# Patient Record
Sex: Female | Born: 1945
Health system: Southern US, Community
[De-identification: ages and names within clinical notes are randomized; demographics above are authoritative.]

## PROBLEM LIST (undated history)

## (undated) DIAGNOSIS — W19XXXA Unspecified fall, initial encounter: Secondary | ICD-10-CM

## (undated) DIAGNOSIS — T7840XA Allergy, unspecified, initial encounter: Secondary | ICD-10-CM

## (undated) DIAGNOSIS — M2550 Pain in unspecified joint: Secondary | ICD-10-CM

## (undated) DIAGNOSIS — I639 Cerebral infarction, unspecified: Secondary | ICD-10-CM

## (undated) DIAGNOSIS — M1990 Unspecified osteoarthritis, unspecified site: Secondary | ICD-10-CM

## (undated) DIAGNOSIS — I10 Essential (primary) hypertension: Secondary | ICD-10-CM

## (undated) DIAGNOSIS — F419 Anxiety disorder, unspecified: Secondary | ICD-10-CM

## (undated) DIAGNOSIS — M549 Dorsalgia, unspecified: Secondary | ICD-10-CM

## (undated) DIAGNOSIS — E7800 Pure hypercholesterolemia, unspecified: Secondary | ICD-10-CM

## (undated) DIAGNOSIS — D689 Coagulation defect, unspecified: Secondary | ICD-10-CM

## (undated) DIAGNOSIS — F32A Depression, unspecified: Secondary | ICD-10-CM

## (undated) DIAGNOSIS — F329 Major depressive disorder, single episode, unspecified: Secondary | ICD-10-CM

## (undated) DIAGNOSIS — R531 Weakness: Secondary | ICD-10-CM

## (undated) HISTORY — PX: APPENDECTOMY: SHX54

## (undated) HISTORY — DX: Anxiety disorder, unspecified: F41.9

## (undated) HISTORY — DX: Coagulation defect, unspecified: D68.9

## (undated) HISTORY — DX: Major depressive disorder, single episode, unspecified: F32.9

## (undated) HISTORY — PX: TONSILLECTOMY: SUR1361

## (undated) HISTORY — PX: HEMORROIDECTOMY: SUR656

## (undated) HISTORY — PX: TUBAL LIGATION: SHX77

## (undated) HISTORY — DX: Unspecified osteoarthritis, unspecified site: M19.90

## (undated) HISTORY — DX: Cerebral infarction, unspecified: I63.9

## (undated) HISTORY — DX: Pain in unspecified joint: M25.50

## (undated) HISTORY — DX: Allergy, unspecified, initial encounter: T78.40XA

## (undated) HISTORY — DX: Depression, unspecified: F32.A

## (undated) HISTORY — DX: Weakness: R53.1

## (undated) HISTORY — DX: Unspecified fall, initial encounter: W19.XXXA

---

## 2011-06-03 ENCOUNTER — Emergency Department (HOSPITAL_COMMUNITY): Payer: Self-pay

## 2011-06-03 ENCOUNTER — Other Ambulatory Visit: Payer: Self-pay

## 2011-06-03 ENCOUNTER — Emergency Department (HOSPITAL_COMMUNITY)
Admission: EM | Admit: 2011-06-03 | Discharge: 2011-06-04 | Disposition: A | Discharge status: Home / Self Care | Payer: Self-pay | Attending: Emergency Medicine | Admitting: Emergency Medicine

## 2011-06-03 DIAGNOSIS — E7800 Pure hypercholesterolemia, unspecified: Secondary | ICD-10-CM | POA: Insufficient documentation

## 2011-06-03 DIAGNOSIS — I10 Essential (primary) hypertension: Secondary | ICD-10-CM | POA: Insufficient documentation

## 2011-06-03 DIAGNOSIS — M549 Dorsalgia, unspecified: Secondary | ICD-10-CM | POA: Insufficient documentation

## 2011-06-03 DIAGNOSIS — R0789 Other chest pain: Secondary | ICD-10-CM | POA: Insufficient documentation

## 2011-06-03 HISTORY — DX: Pure hypercholesterolemia, unspecified: E78.00

## 2011-06-03 HISTORY — DX: Essential (primary) hypertension: I10

## 2011-06-03 LAB — CBC
HCT: 43.7 % (ref 36.0–46.0)
Hemoglobin: 14.6 g/dL (ref 12.0–15.0)
MCH: 27.7 pg (ref 26.0–34.0)
MCV: 82.8 fL (ref 78.0–100.0)
RBC: 5.28 MIL/uL — ABNORMAL HIGH (ref 3.87–5.11)

## 2011-06-03 LAB — DIFFERENTIAL
Eosinophils Absolute: 0.1 10*3/uL (ref 0.0–0.7)
Eosinophils Relative: 2 % (ref 0–5)
Lymphs Abs: 2.8 10*3/uL (ref 0.7–4.0)
Monocytes Absolute: 0.8 10*3/uL (ref 0.1–1.0)
Monocytes Relative: 8 % (ref 3–12)
Neutrophils Relative %: 61 % (ref 43–77)

## 2011-06-03 LAB — BASIC METABOLIC PANEL
BUN: 13 mg/dL (ref 6–23)
GFR calc non Af Amer: 88 mL/min — ABNORMAL LOW (ref >90)
Glucose, Bld: 90 mg/dL (ref 70–99)
Potassium: 4.1 mEq/L (ref 3.5–5.1)

## 2011-06-03 MED ORDER — DIAZEPAM 5 MG PO TABS
5.0000 mg | ORAL_TABLET | Freq: Once | ORAL | Status: AC
  Administered 2011-06-04: 5 mg via ORAL
  Filled 2011-06-03: qty 1

## 2011-06-03 MED ORDER — OXYCODONE-ACETAMINOPHEN 5-325 MG PO TABS
1.0000 | ORAL_TABLET | Freq: Once | ORAL | Status: AC
  Administered 2011-06-04: 1 via ORAL
  Filled 2011-06-03: qty 1

## 2011-06-03 NOTE — ED Notes (Signed)
Patient presents with chest pain under left breast and "indigestion" since 1300 today.  Patient also reporting upper back pain but does not radiated to chest.  Denies nausea and vomiting. Patient rates pain at 6/10

## 2011-06-03 NOTE — ED Notes (Signed)
Pt st's had onset of left upper back pain approx 2pm today. St's then started radiating around to under left breast.  St's pain is mostly under left breast radiating into back.  Skin warm and dry, color appropriate.  Family at bedside.

## 2011-06-04 LAB — POCT I-STAT TROPONIN I: Troponin i, poc: 0.01 ng/mL (ref 0.00–0.08)

## 2011-06-04 MED ORDER — DIAZEPAM 5 MG PO TABS: 5.0000 mg | ORAL_TABLET | Freq: Three times a day (TID) | ORAL | Status: AC | PRN

## 2011-06-04 MED ORDER — OXYCODONE-ACETAMINOPHEN 5-325 MG PO TABS: 2.0000 | ORAL_TABLET | ORAL | Status: AC | PRN

## 2011-06-04 NOTE — ED Provider Notes (Signed)
History     CSN: [id number] Arrival date & time: 06/03/2011  5:52 PM   First MD Initiated Contact with Patient 06/03/11 2300      Chief Complaint  Patient presents with  . Chest Pain  . Back Pain    (Consider location/radiation/quality/duration/timing/severity/associated sxs/prior treatment) HPI 65 year old female presents to emergency department with back pain that radiates around to under her left breast. Patient reports onset of left back pain today around 2 PM. Patient reports history of chronic left back pain, but it does not usually radiate around to her chest. Chest pain started around 4 PM today. Pain has been constant since that time with intermittent sharp stabbing pains. Pain is worse with movement of arm, cough, or deep breath. Pain also worse with palpation of back and left chest wall. Patient reports history of hypertension and hyperlipidemia. Patient is nonsmoker. No prior history of coronary disease. Patient does not have a local doctor. No radiation of pain, no nausea no diaphoresis no shortness of breath associated with the pain. Patient did have some "gas pains" earlier this resolved after taking over-the-counter medications. Past Medical History  Diagnosis Date  . Hypertension   . Hypercholesteremia     Past Surgical History  Procedure Date  . Appendectomy   . Tonsillectomy   . Hemorroidectomy   . Tubal ligation     History reviewed. No pertinent family history.  History  Substance Use Topics  . Smoking status: Former Games developer  . Smokeless tobacco: Not on file  . Alcohol Use: Yes    OB History    Grav Para Term Preterm Abortions TAB SAB Ect Mult Living                  Review of Systems  Constitutional: Negative.   HENT: Negative.   Eyes: Negative.   Respiratory: Negative.   Cardiovascular: Negative.   Gastrointestinal: Negative.   Genitourinary: Negative.   Musculoskeletal: Negative.   Skin: Negative.   Neurological: Negative.     Hematological: Negative.   Psychiatric/Behavioral: Negative.   All other systems reviewed and are negative.    Allergies  Aspirin  Home Medications   Current Outpatient Rx  Name Route Sig Dispense Refill  . ACETAMINOPHEN 325 MG PO TABS Oral Take 650 mg by mouth every 6 (six) hours as needed. For pain     . GARLIC 100 MG PO TABS Oral Take 100 mg by mouth daily.      Marland Kitchen LISINOPRIL 10 MG PO TABS Oral Take 10 mg by mouth daily.      . OMEGA-3-ACID ETHYL ESTERS 1 G PO CAPS Oral Take 2 g by mouth every morning.        BP 141/59  Pulse 65  Temp(Src) 97 F (36.1 C) (Oral)  Resp 20  SpO2 100%  Physical Exam  Nursing note and vitals reviewed. Constitutional: She is oriented to person, place, and time. She appears well-developed and well-nourished.  HENT:  Head: Normocephalic and atraumatic.  Right Ear: External ear normal.  Left Ear: External ear normal.  Nose: Nose normal.  Mouth/Throat: Oropharynx is clear and moist.  Eyes: Conjunctivae and EOM are normal. Pupils are equal, round, and reactive to light.  Neck: Normal range of motion. Neck supple. No JVD present. No tracheal deviation present. No thyromegaly present.  Cardiovascular: Normal rate, regular rhythm, normal heart sounds and intact distal pulses.  Exam reveals no gallop and no friction rub.   No murmur heard. Pulmonary/Chest: Effort normal and breath  sounds normal. No stridor. No respiratory distress. She has no wheezes. She has no rales. She exhibits tenderness.       Patient with chest wall tenderness from left mid sternum around to back. Palpation under left breast and left chest wall reproduces the pain  Abdominal: Soft. Bowel sounds are normal. She exhibits no distension and no mass. There is no tenderness. There is no rebound and no guarding.  Musculoskeletal: Normal range of motion. She exhibits no edema and no tenderness.  Lymphadenopathy:    She has no cervical adenopathy.  Neurological: She is oriented to  person, place, and time. She has normal reflexes. No cranial nerve deficit. She exhibits normal muscle tone. Coordination normal.  Skin: Skin is dry. No rash noted. No erythema. No pallor.  Psychiatric: She has a normal mood and affect. Her behavior is normal. Judgment and thought content normal.    ED Course  Procedures (including critical care time)  Labs Reviewed  CBC - Abnormal; Notable for the following:    RBC 5.28 (*)    All other components within normal limits  BASIC METABOLIC PANEL - Abnormal; Notable for the following:    GFR calc non Af Amer 88 (*)    All other components within normal limits  DIFFERENTIAL  POCT I-STAT TROPONIN I  POCT I-STAT TROPONIN I  I-STAT TROPONIN I  I-STAT TROPONIN I   Dg Chest 2 View  06/03/2011  *RADIOLOGY REPORT*  Clinical Data: New onset of left upper back pain, radiating to the left breast.  CHEST - 2 VIEW  Comparison: None.  Findings: The lungs are well-aerated and clear.  There is no evidence of focal opacification, pleural effusion or pneumothorax.  The heart is normal in size; the mediastinal contour is within normal limits.  No acute osseous abnormalities are seen.  IMPRESSION: No acute cardiopulmonary process seen.  No displaced rib fractures identified.  Original Report Authenticated By: Tonia Ghent, M.D.     No diagnosis found.   Date: 06/04/2011  Rate: 68  Rhythm: normal sinus rhythm  QRS Axis: normal  Intervals: normal  ST/T Wave abnormalities: normal  Conduction Disutrbances:none  Narrative Interpretation:   Old EKG Reviewed: none available   MDM  65 year old female with constant chest and back pain since 2 PM with 2 sets of negative cardiac markers, negative chest x-ray and EKG, and pain reproducible with palpation. Patient does have risk factors for coronary disease but do not feel at this time that her symptoms are are representative of acute coronary syndrome or unstable angina. This was discussed with the patient  along with need for followup with a primary care Dr. for further workup and she agrees with the plan        Olivia Mackie, MD 06/04/11 [phone]

## 2011-12-07 ENCOUNTER — Ambulatory Visit: Payer: Self-pay | Admitting: Family Medicine

## 2011-12-07 VITALS — BP 120/75 | HR 69 | Temp 97.8°F | Resp 18 | Ht 63.5 in | Wt 224.4 lb

## 2011-12-07 DIAGNOSIS — E785 Hyperlipidemia, unspecified: Secondary | ICD-10-CM

## 2011-12-07 DIAGNOSIS — I10 Essential (primary) hypertension: Secondary | ICD-10-CM

## 2011-12-07 LAB — LIPID PANEL: HDL: 47 mg/dL (ref >39)

## 2011-12-07 LAB — COMPREHENSIVE METABOLIC PANEL
ALT: 17 U/L (ref 0–35)
AST: 14 U/L (ref 0–37)
Albumin: 4.1 g/dL (ref 3.5–5.2)
BUN: 14 mg/dL (ref 6–23)
Calcium: 9.5 mg/dL (ref 8.4–10.5)
Chloride: 108 mEq/L (ref 96–112)
Potassium: 4.9 mEq/L (ref 3.5–5.3)

## 2011-12-07 MED ORDER — LISINOPRIL 20 MG PO TABS: 20.0000 mg | ORAL_TABLET | Freq: Every day | ORAL | Status: DC

## 2011-12-07 NOTE — Progress Notes (Signed)
  Subjective:    Patient ID: Mary Le, female    DOB: 1946-06-18, 66 y.o.   MRN: [id number]  HPI Mary Le is a 66 y.o. female  HTN - on lisinopril 20mg  for years, outside BP's higher than here.  May not be checking it correct. No new side effects.   Hyperlipidemia - last checked approximately 18 months ago. LDL - over 200?  Not sure  taking fish oil otc, garlic, and niacin otc.  Tried statins in past - 3 episodes, had bad bodyaches and chest pains - had to be taken off - intolerant to any of these.    Review of Systems  Constitutional: Negative for fatigue and unexpected weight change.  Respiratory: Negative for chest tightness and shortness of breath.   Cardiovascular: Negative for chest pain, palpitations and leg swelling.  Gastrointestinal: Negative for abdominal pain and blood in stool.  Neurological: Negative for dizziness, syncope, light-headedness and headaches.   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      Objective:   Physical Exam        Assessment & Plan:  Mary Le is a 66 y.o. female  1. Unspecified essential hypertension  Comprehensive metabolic panel, Lipid panel  2. Hyperlipidemia     HTN - Controlled.  Continue same doses of meds, and bring meter to next ov.  check CMP and lipids.  Hyperlipidemia - check lipids, has not tolerated statins in past.  Recheck next 6 months.

## 2011-12-07 NOTE — Patient Instructions (Signed)
To look up more info on your condition, go to the website [email or web address], then on patient resources - select [facility]. Under patient resources, select hypertension, and hyperlipidemia.  Return to the clinic or go to the nearest emergency room if any of your symptoms worsen or new symptoms occur.

## 2011-12-23 ENCOUNTER — Other Ambulatory Visit: Payer: Self-pay | Admitting: Family Medicine

## 2011-12-23 MED ORDER — CELECOXIB 200 MG PO CAPS: 200.0000 mg | ORAL_CAPSULE | Freq: Two times a day (BID) | ORAL | Status: AC

## 2012-07-17 ENCOUNTER — Other Ambulatory Visit: Payer: Self-pay | Admitting: Family Medicine

## 2012-08-10 ENCOUNTER — Ambulatory Visit: Payer: Self-pay | Admitting: Family Medicine

## 2012-08-31 ENCOUNTER — Other Ambulatory Visit: Payer: Self-pay | Admitting: Family Medicine

## 2012-08-31 DIAGNOSIS — I10 Essential (primary) hypertension: Secondary | ICD-10-CM

## 2012-08-31 MED ORDER — LISINOPRIL 20 MG PO TABS: 20.0000 mg | ORAL_TABLET | Freq: Every day | ORAL | Status: DC

## 2012-08-31 NOTE — Progress Notes (Signed)
Husband in office as patient. Gittel has appt in about a month but out of meds.

## 2012-09-21 ENCOUNTER — Encounter: Payer: Self-pay | Admitting: Family Medicine

## 2012-10-05 ENCOUNTER — Telehealth: Payer: Self-pay

## 2012-10-05 DIAGNOSIS — I10 Essential (primary) hypertension: Secondary | ICD-10-CM

## 2012-10-05 MED ORDER — LISINOPRIL 20 MG PO TABS: 20.0000 mg | ORAL_TABLET | Freq: Every day | ORAL | Status: DC

## 2012-10-05 NOTE — Telephone Encounter (Signed)
Pt is out of lisinopril (PRINIVIL,ZESTRIL) 20 MG tablet Has an appointment in April   [facility] [address]   [phone]

## 2012-10-14 ENCOUNTER — Telehealth: Payer: Self-pay

## 2012-10-14 DIAGNOSIS — I10 Essential (primary) hypertension: Secondary | ICD-10-CM

## 2012-10-14 MED ORDER — LISINOPRIL 20 MG PO TABS: 20.0000 mg | ORAL_TABLET | Freq: Every day | ORAL | Status: DC

## 2012-10-14 NOTE — Telephone Encounter (Signed)
We had to change pt appt with Dr. Neva Seat from 4/7 to 5/5 (CPE), need a refill of lisinopril to carry her thru  [facility] on [address]  Pt [phone]

## 2012-10-14 NOTE — Telephone Encounter (Signed)
Sent in since we were the reason for r/s

## 2012-10-26 ENCOUNTER — Encounter: Payer: Self-pay | Admitting: Family Medicine

## 2012-11-16 ENCOUNTER — Encounter: Payer: Self-pay | Admitting: Family Medicine

## 2012-11-23 ENCOUNTER — Encounter: Payer: Self-pay | Admitting: Family Medicine

## 2012-11-23 ENCOUNTER — Ambulatory Visit (INDEPENDENT_AMBULATORY_CARE_PROVIDER_SITE_OTHER): Payer: Medicare Other | Admitting: Family Medicine

## 2012-11-23 VITALS — BP 123/62 | HR 75 | Temp 98.1°F | Resp 17 | Ht 63.75 in | Wt 216.6 lb

## 2012-11-23 DIAGNOSIS — L271 Localized skin eruption due to drugs and medicaments taken internally: Secondary | ICD-10-CM

## 2012-11-23 DIAGNOSIS — R002 Palpitations: Secondary | ICD-10-CM | POA: Diagnosis not present

## 2012-11-23 DIAGNOSIS — Z01419 Encounter for gynecological examination (general) (routine) without abnormal findings: Secondary | ICD-10-CM | POA: Diagnosis not present

## 2012-11-23 DIAGNOSIS — I10 Essential (primary) hypertension: Secondary | ICD-10-CM

## 2012-11-23 DIAGNOSIS — Z139 Encounter for screening, unspecified: Secondary | ICD-10-CM | POA: Diagnosis not present

## 2012-11-23 DIAGNOSIS — Z1231 Encounter for screening mammogram for malignant neoplasm of breast: Secondary | ICD-10-CM

## 2012-11-23 DIAGNOSIS — L270 Generalized skin eruption due to drugs and medicaments taken internally: Secondary | ICD-10-CM | POA: Diagnosis not present

## 2012-11-23 DIAGNOSIS — Z23 Encounter for immunization: Secondary | ICD-10-CM

## 2012-11-23 DIAGNOSIS — E785 Hyperlipidemia, unspecified: Secondary | ICD-10-CM

## 2012-11-23 DIAGNOSIS — Z0000 Encounter for general adult medical examination without abnormal findings: Secondary | ICD-10-CM

## 2012-11-23 DIAGNOSIS — Z1239 Encounter for other screening for malignant neoplasm of breast: Secondary | ICD-10-CM

## 2012-11-23 LAB — POCT URINALYSIS DIPSTICK
Blood, UA: NEGATIVE
Glucose, UA: NEGATIVE
Nitrite, UA: NEGATIVE
Protein, UA: NEGATIVE
Urobilinogen, UA: 0.2

## 2012-11-23 LAB — CBC WITH DIFFERENTIAL/PLATELET
HCT: 43 % (ref 36.0–46.0)
Hemoglobin: 14.8 g/dL (ref 12.0–15.0)
Lymphocytes Relative: 29 % (ref 12–46)
Lymphs Abs: 2.1 10*3/uL (ref 0.7–4.0)
Monocytes Absolute: 0.4 10*3/uL (ref 0.1–1.0)
Monocytes Relative: 6 % (ref 3–12)
Neutro Abs: 4.5 10*3/uL (ref 1.7–7.7)
RBC: 5.32 MIL/uL — ABNORMAL HIGH (ref 3.87–5.11)
WBC: 7.1 10*3/uL (ref 4.0–10.5)

## 2012-11-23 LAB — LIPID PANEL
Cholesterol: 233 mg/dL — ABNORMAL HIGH (ref 0–200)
LDL Cholesterol: 165 mg/dL — ABNORMAL HIGH (ref 0–99)
Triglycerides: 121 mg/dL (ref <150)

## 2012-11-23 LAB — COMPREHENSIVE METABOLIC PANEL
Albumin: 3.9 g/dL (ref 3.5–5.2)
CO2: 22 mEq/L (ref 19–32)
Glucose, Bld: 81 mg/dL (ref 70–99)
Potassium: 4.2 mEq/L (ref 3.5–5.3)
Sodium: 141 mEq/L (ref 135–145)
Total Protein: 6.8 g/dL (ref 6.0–8.3)

## 2012-11-23 MED ORDER — LISINOPRIL 20 MG PO TABS: 20.0000 mg | ORAL_TABLET | Freq: Every day | ORAL | Status: DC

## 2012-11-23 NOTE — Progress Notes (Signed)
Subjective:    Patient ID: Mary Le, female    DOB: 09-29-1945, 67 y.o.   MRN: [id number]  HPI Mary Le is a 67 y.o. female Here for welcome to medicare physical, but by patient health survey multiple other concerns including itchy eyes. back pain, palpitations, constipation, headaches, numbness in feet, itching in ears at night, and balance concerns. Last ov with me in May 2013. meds refilled in 08/31/12 with plan of ov in 1 month.   Zostavax - never had  Pneumovax - never had.  Col[place]: [place] one about 5 years ago in [place]- plan on 10 year repeat. Records requested.  Td: 2008 Mammogram: 2011.  No fh of breast cancer. Pap: last one in 2011. Normal then. Menopause 8 years ago - no postmenopausal bleeding.  Home safety assessment - minimal concern - with feeling may slip in tub. See scanned copy.  Beck depression scale 8.  Score of 3 on loss of interest in sex.  Advanced directives: has talked informally with family. Plans to put in writing.  DNR - no code at this point.  Vision ok, whisper test ok.   Last ov May 2013.  HTN - on lisinopril 20mg  for years, outside BP's:130-140/69-82.   Hyperlipidemia -  taking fish oil otc, garlic, and niacin otc in past.  Tried statins in past - 3 episodes, had bad bodyaches and chest pains - had to be taken off - intolerant to any of these.  Only taking niacin with B complex and garlic in foods. Not wanting to start statin medicine, but may be amenable to other classes of meds.   Itching ear at night. No ear discharge or pain, no fever.   Constipation - on and off all life. No recent worsening.   Palpitations. Only noted during viral GI illness with vomiting, no recent cp or palpitations.  May have been with being dehydrated during illness a week ago.   Low back pain - longstanding,  Better recently with otc treatment with medication strip. Plans on follow up to discuss.   Sinus headaches with allergies, rare migraine if dehydrated. No  current HA - plans on follow up to discuss. Using nasal strip - helps with allergies - prior on alavert.  Notices numbness in both feet, bottom of feet at night only. Several months. Intermittent.   Balance - feels like knees are bad past 10 years, like may give away, and has to be careful how steps, not losing balance or dizzy, but more of knees feel unstable at times - past few years. . No recent injury.  Plans on follow up for further discussion.   Results for orders placed in visit on 12/07/11  COMPREHENSIVE METABOLIC PANEL      Result Value Range   Sodium 139  135 - 145 mEq/L   Potassium 4.9  3.5 - 5.3 mEq/L   Chloride 108  96 - 112 mEq/L   CO2 27  19 - 32 mEq/L   Glucose, Bld 96  70 - 99 mg/dL   BUN 14  6 - 23 mg/dL   Creat 4.09  8.11 - 9.14 mg/dL   Total Bilirubin 0.4  0.3 - 1.2 mg/dL   Alkaline Phosphatase 49  39 - 117 U/L   AST 14  0 - 37 U/L   ALT 17  0 - 35 U/L   Total Protein 7.0  6.0 - 8.3 g/dL   Albumin 4.1  3.5 - 5.2 g/dL   Calcium 9.5  8.4 -  10.5 mg/dL  LIPID PANEL      Result Value Range   Cholesterol 248 (*) 0 - 200 mg/dL   Triglycerides 161  <096 mg/dL   HDL 47  >04 mg/dL   Total CHOL/HDL Ratio 5.3     VLDL 27  0 - 40 mg/dL   LDL Cholesterol 540 (*) 0 - 99 mg/dL    Review of Systems  Constitutional: Negative for fatigue and unexpected weight change.  Respiratory: Negative for chest tightness and shortness of breath.   Cardiovascular: Positive for palpitations (as above. ). Negative for chest pain and leg swelling.  Gastrointestinal: Negative for abdominal pain and blood in stool.  Genitourinary: Negative for vaginal bleeding and vaginal pain.  Neurological: Positive for numbness (feet intermittently. ). Negative for dizziness, tremors, syncope, weakness and light-headedness.   As above. No heat/cold intolerance. Lost some weight with GI illness. 13 point review of systems per patient health survey noted.  Negative other than as indicated on reviewed nursing  note.      Objective:   Physical Exam  Vitals reviewed. Constitutional: She is oriented to person, place, and time. She appears well-developed and well-nourished.  HENT:  Head: Normocephalic and atraumatic.  Right Ear: External ear normal.  Left Ear: External ear normal.  Mouth/Throat: Oropharynx is clear and moist.  Eyes: Conjunctivae are normal. Pupils are equal, round, and reactive to light.  Neck: Normal range of motion. Neck supple. No thyromegaly present.  Cardiovascular: Normal rate, regular rhythm, normal heart sounds and intact distal pulses.   No murmur heard. Pulmonary/Chest: Effort normal and breath sounds normal. No respiratory distress. She has no wheezes. Right breast exhibits no mass, no nipple discharge, no skin change and no tenderness. Left breast exhibits no mass, no nipple discharge, no skin change and no tenderness.  Abdominal: Soft. Bowel sounds are normal. There is no tenderness.  Musculoskeletal: Normal range of motion. She exhibits no edema and no tenderness.       Right knee: She exhibits normal range of motion and no swelling. No tenderness found.       Left knee: She exhibits normal range of motion and no swelling. No tenderness found.       Lumbar back: She exhibits no tenderness, no bony tenderness, no swelling and no spasm.  Lymphadenopathy:    She has no cervical adenopathy.    She has no axillary adenopathy.       Right: No supraclavicular adenopathy present.       Left: No supraclavicular adenopathy present.  Neurological: She is alert and oriented to person, place, and time.  Skin: Skin is warm and dry. No rash noted.  Psychiatric: She has a normal mood and affect. Her behavior is normal. Thought content normal.   EKG: sr, flat/nonspecific t waves anterolaterally without acute findings otherwise   Results for orders placed in visit on 11/23/12  POCT URINALYSIS DIPSTICK      Result Value Range   Color, UA yellow     Clarity, UA clear      Glucose, UA neg     Bilirubin, UA small     Ketones, UA trace     Spec Grav, UA 1.025     Blood, UA neg     pH, UA 5.0     Protein, UA neg     Urobilinogen, UA 0.2     Nitrite, UA neg     Leukocytes, UA Negative         Assessment &  Plan:  Mary Le is a 67 y.o. female  Routine general medical examination at a health care facility - Plan: EKG 12-Lead, IFOBT POC (occult bld, rslt in office), CBC with Differential, TSH, Comprehensive metabolic panel, Lipid panel, Pap IG w/ reflex to HPV when ASC-U, POCT urinalysis dipstick, CANCELED: Pap IG w/ reflex to HPV when ASC-U  Palpitations - Plan: EKG 12-Lead, IFOBT POC (occult bld, rslt in office), CBC with Differential, TSH, Comprehensive metabolic panel, Lipid panel, CANCELED: Pap IG w/ reflex to HPV when ASC-U  Unspecified essential hypertension - Plan: EKG 12-Lead, IFOBT POC (occult bld, rslt in office), CBC with Differential, TSH, Comprehensive metabolic panel, Lipid panel, CANCELED: Pap IG w/ reflex to HPV when ASC-U  Other and unspecified hyperlipidemia - Plan: EKG 12-Lead, IFOBT POC (occult bld, rslt in office), CBC with Differential, TSH, Comprehensive metabolic panel, Lipid panel, CANCELED: Pap IG w/ reflex to HPV when ASC-U  HTN (hypertension) - Plan: lisinopril (PRINIVIL,ZESTRIL) 20 MG tablet  Breast cancer screening - Plan: MM Digital Screening  Need for prophylactic vaccination against Streptococcus pneumoniae (pneumococcus) - Plan: Pneumococcal polysaccharide vaccine 23-valent greater than or equal to 2yo subcutaneous/IM  Palmar plantar dysesthesia    CPE/welcome to [facility] physical. See above for labs and screening completed. Advance directives discussed and requests no code/DNR. Placed this in specialty comments on snapshot.   mmg ordered, PAP 2 obtained.  rx for Zostavax given, pneumovax given.  utd on colonoscopy (will obtain records), and Td. hemosure sent home with patient.   Palpitations - likely with volume depletion  with GI illness. Check TSH, cbc, no acute findings noted on EKG, but if sx's recur, consider cards eval/holter.   Knee pain/balance difficulty- on further hx does not appear to be balance/neurologic issue but more d/t knee pain and feeling of instability. No acute findings on knee exam today, but plan on discussing further at next ov.   LBP -  Intermittent, recurrent.  likely mechanical with component of overweight vs deconditioning. Plan to discuss further next ov and possible HEP.   Constipation - recurrent.  Check tsh.  May need to discuss bowel regimen, fiber intake next ov.   HTN - stable - cont current meds. Check CMP, lipids.   Hyperlipidemia - intolerant to statins.  Consider tricor/fibrates, but labs pending. Ok to cont garlic and otc niacin for now.   Plantar dysesthesias - intermittent at night only. Check tsh, cbc, lytes/glucose, and discuss further next ov.  ddx of RLS.   Meds ordered this encounter           . lisinopril (PRINIVIL,ZESTRIL) 20 MG tablet    Sig: Take 1 tablet (20 mg total) by mouth daily.    Dispense:  90 tablet    Refill:  1   Patient Instructions  Ok to continue nasal strips or alavert for allergies.  Will check blood work for palpitations and numbness in the feet, but will need to follow up in the next 6 weeks to discuss this and other concerns including your back pain, balance concerns in knees and headaches. Return to the clinic or go to the nearest emergency room if any of your symptoms worsen or new symptoms occur, especially if return of palpitations.  Prescription for zostavax for injection at your pharmacy. Pneumonia vaccine given today. Let us know about your previous practice to determine when colonoscopy is needed to be repeated.   Your should receive a call or letter about your lab results within the next week to 10  days.  Can discuss options for cholesterol at the next visit. Continue same dose of medicine for your blood pressure at this point.   We will refer you for the mammogram.   Keeping You Healthy  Get These Tests  Blood Pressure- Have your blood pressure checked by your healthcare provider at least once a year.  Normal blood pressure is 120/80.  Weight- Have your body mass index (BMI) calculated to screen for obesity.  BMI is a measure of body fat based on height and weight.  You can calculate your own BMI at https://www.west-esparza.com/  Cholesterol- Have your cholesterol checked every year.  Diabetes- Have your blood sugar checked every year if you have high blood pressure, high cholesterol, a family history of diabetes or if you are overweight.  Pap Smear- Have a pap smear every 1 to 3 years if you have been sexually active.  If you are older than 65 and recent pap smears have been normal you may not need additional pap smears.  In addition, if you have had a hysterectomy  For benign disease additional pap smears are not necessary.  Mammogram-Yearly mammograms are essential for early detection of breast cancer  Screening for Colon Cancer- Colonoscopy starting at age 66. Screening may begin sooner depending on your family history and other health conditions.  Follow up colonoscopy as directed by your Gastroenterologist.  Screening for Osteoporosis- Screening begins at age 46 with bone density scanning, sooner if you are at higher risk for developing Osteoporosis.  Get these medicines  Calcium with Vitamin D- Your body requires 1200-1500 mg of Calcium a day and [phone] IU of Vitamin D a day.  You can only absorb 500 mg of Calcium at a time therefore Calcium must be taken in 2 or 3 separate doses throughout the day.  Hormones- Hormone therapy has been associated with increased risk for certain cancers and heart disease.  Talk to your healthcare provider about if you need relief from menopausal symptoms.  Aspirin- Ask your healthcare provider about taking Aspirin to prevent Heart Disease and Stroke.  Get these  Immuniztions  Flu shot- Every fall  Pneumonia shot- Once after the age of 57; if you are younger ask your healthcare provider if you need a pneumonia shot.  Tetanus- Every ten years.  Zostavax- Once after the age of 73 to prevent shingles.  Take these steps  Don't smoke- Your healthcare provider can help you quit. For tips on how to quit, ask your healthcare provider or go to www.smokefree.gov or call 1-800 QUIT-NOW.  Be physically active- Exercise 5 days a week for a minimum of 30 minutes.  If you are not already physically active, start slow and gradually work up to 30 minutes of moderate physical activity.  Try walking, dancing, bike riding, swimming, etc.  Eat a healthy diet- Eat a variety of healthy foods such as fruits, vegetables, whole grains, low fat milk, low fat cheeses, yogurt, lean meats, chicken, fish, eggs, dried beans, tofu, etc.  For more information go to www.thenutritionsource.org  Dental visit- Brush and floss teeth twice daily; visit your dentist twice a year.  Eye exam- Visit your Optometrist or Ophthalmologist yearly.  Drink alcohol in moderation- Limit alcohol intake to one drink or less a day.  Never drink and drive.  Depression- Your emotional health is as important as your physical health.  If you're feeling down or losing interest in things you normally enjoy, please talk to your healthcare provider.  Seat Belts- can save  your life; always wear one  Smoke/Carbon Monoxide detectors- These detectors need to be installed on the appropriate level of your home.  Replace batteries at least once a year.  Violence- If anyone is threatening or hurting you, please tell your healthcare provider. Living Will/ Health care power of attorney- Discuss with your healthcare provider and family.

## 2012-11-23 NOTE — Progress Notes (Signed)
  Subjective:    Patient ID: Mary Le, female    DOB: September 20, 1945, 67 y.o.   MRN: [id number]  HPI    Review of Systems  Constitutional: Negative.   HENT: Negative.   Eyes: Positive for itching.  Respiratory: Negative.   Cardiovascular: Positive for palpitations.  Gastrointestinal: Positive for constipation.  Endocrine: Negative.   Genitourinary: Negative.   Musculoskeletal: Positive for back pain.  Skin: Negative.   Allergic/Immunologic: Negative.   Neurological: Negative.   Hematological: Negative.   Psychiatric/Behavioral: Negative.        Objective:   Physical Exam        Assessment & Plan:

## 2012-11-23 NOTE — Patient Instructions (Addendum)
Ok to continue nasal strips or alavert for allergies.  Will check blood work for palpitations and numbness in the feet, but will need to follow up in the next 6 weeks to discuss this and other concerns including your back pain, balance concerns in knees and headaches. Return to the clinic or go to the nearest emergency room if any of your symptoms worsen or new symptoms occur, especially if return of palpitations.  Prescription for zostavax for injection at your pharmacy. Pneumonia vaccine given today. Let us know about your previous practice to determine when colonoscopy is needed to be repeated.   Your should receive a call or letter about your lab results within the next week to 10 days.  Can discuss options for cholesterol at the next visit. Continue same dose of medicine for your blood pressure at this point.  We will refer you for the mammogram.   Keeping You Healthy  Get These Tests  Blood Pressure- Have your blood pressure checked by your healthcare provider at least once a year.  Normal blood pressure is 120/80.  Weight- Have your body mass index (BMI) calculated to screen for obesity.  BMI is a measure of body fat based on height and weight.  You can calculate your own BMI at https://www.west-esparza.com/  Cholesterol- Have your cholesterol checked every year.  Diabetes- Have your blood sugar checked every year if you have high blood pressure, high cholesterol, a family history of diabetes or if you are overweight.  Pap Smear- Have a pap smear every 1 to 3 years if you have been sexually active.  If you are older than 65 and recent pap smears have been normal you may not need additional pap smears.  In addition, if you have had a hysterectomy  For benign disease additional pap smears are not necessary.  Mammogram-Yearly mammograms are essential for early detection of breast cancer  Screening for Colon Cancer- Colonoscopy starting at age 12. Screening may begin sooner depending on your  family history and other health conditions.  Follow up colonoscopy as directed by your Gastroenterologist.  Screening for Osteoporosis- Screening begins at age 93 with bone density scanning, sooner if you are at higher risk for developing Osteoporosis.  Get these medicines  Calcium with Vitamin D- Your body requires 1200-1500 mg of Calcium a day and [phone] IU of Vitamin D a day.  You can only absorb 500 mg of Calcium at a time therefore Calcium must be taken in 2 or 3 separate doses throughout the day.  Hormones- Hormone therapy has been associated with increased risk for certain cancers and heart disease.  Talk to your healthcare provider about if you need relief from menopausal symptoms.  Aspirin- Ask your healthcare provider about taking Aspirin to prevent Heart Disease and Stroke.  Get these Immuniztions  Flu shot- Every fall  Pneumonia shot- Once after the age of 63; if you are younger ask your healthcare provider if you need a pneumonia shot.  Tetanus- Every ten years.  Zostavax- Once after the age of 72 to prevent shingles.  Take these steps  Don't smoke- Your healthcare provider can help you quit. For tips on how to quit, ask your healthcare provider or go to www.smokefree.gov or call 1-800 QUIT-NOW.  Be physically active- Exercise 5 days a week for a minimum of 30 minutes.  If you are not already physically active, start slow and gradually work up to 30 minutes of moderate physical activity.  Try walking, dancing, bike riding, swimming,  etc.  Eat a healthy diet- Eat a variety of healthy foods such as fruits, vegetables, whole grains, low fat milk, low fat cheeses, yogurt, lean meats, chicken, fish, eggs, dried beans, tofu, etc.  For more information go to www.thenutritionsource.org  Dental visit- Brush and floss teeth twice daily; visit your dentist twice a year.  Eye exam- Visit your Optometrist or Ophthalmologist yearly.  Drink alcohol in moderation- Limit alcohol  intake to one drink or less a day.  Never drink and drive.  Depression- Your emotional health is as important as your physical health.  If you're feeling down or losing interest in things you normally enjoy, please talk to your healthcare provider.  Seat Belts- can save your life; always wear one  Smoke/Carbon Monoxide detectors- These detectors need to be installed on the appropriate level of your home.  Replace batteries at least once a year.  Violence- If anyone is threatening or hurting you, please tell your healthcare provider. Living Will/ Health care power of attorney- Discuss with your healthcare provider and family.

## 2012-11-25 LAB — PAP IG W/ RFLX HPV ASCU

## 2012-11-25 LAB — IFOBT (OCCULT BLOOD): IFOBT: NEGATIVE

## 2012-12-02 ENCOUNTER — Telehealth: Payer: Self-pay

## 2012-12-02 NOTE — Telephone Encounter (Signed)
Patient advised labs okay except cholesterol elevated. She is intolerant to statins. She states she will go back on her Omega 3. She is having ankle pain, advised her to come in for this if it continues, she will use ice and Advil today, agrees to come in if pain continues, she wanted to know if we checked her uric acid. Advised her we did not, but if she is concerned about gout, we can do this for her. To you FYI

## 2012-12-02 NOTE — Telephone Encounter (Signed)
Pt is calling to see about her lab results Call back number is [phone]

## 2012-12-03 NOTE — Telephone Encounter (Signed)
Noted, thanks!

## 2012-12-29 ENCOUNTER — Ambulatory Visit
Admission: RE | Admit: 2012-12-29 | Discharge: 2012-12-29 | Disposition: A | Discharge status: Home / Self Care | Payer: Medicare Other | Source: Ambulatory Visit | Attending: Family Medicine | Admitting: Family Medicine

## 2012-12-29 DIAGNOSIS — Z1231 Encounter for screening mammogram for malignant neoplasm of breast: Secondary | ICD-10-CM | POA: Diagnosis not present

## 2013-01-04 ENCOUNTER — Ambulatory Visit (INDEPENDENT_AMBULATORY_CARE_PROVIDER_SITE_OTHER): Payer: Medicare Other | Admitting: Family Medicine

## 2013-01-04 ENCOUNTER — Encounter: Payer: Self-pay | Admitting: Family Medicine

## 2013-01-04 VITALS — BP 145/66 | HR 70 | Temp 98.2°F | Resp 18 | Ht 63.5 in | Wt 214.4 lb

## 2013-01-04 DIAGNOSIS — L299 Pruritus, unspecified: Secondary | ICD-10-CM

## 2013-01-04 DIAGNOSIS — I10 Essential (primary) hypertension: Secondary | ICD-10-CM

## 2013-01-04 DIAGNOSIS — M2140 Flat foot [pes planus] (acquired), unspecified foot: Secondary | ICD-10-CM

## 2013-01-04 DIAGNOSIS — R200 Anesthesia of skin: Secondary | ICD-10-CM

## 2013-01-04 DIAGNOSIS — R209 Unspecified disturbances of skin sensation: Secondary | ICD-10-CM | POA: Diagnosis not present

## 2013-01-04 LAB — URIC ACID: Uric Acid, Serum: 5.6 mg/dL (ref 2.4–7.0)

## 2013-01-04 NOTE — Progress Notes (Signed)
Subjective:    Patient ID: Mary Le, female    DOB: 1945-10-15, 67 y.o.   MRN: [id number]  HPI Mary Le is a 67 y.o. female See prior physical - 11/23/12, with multiple other concerns addressed at that time.  Here today with concern of:  Tingling in feet/discomfort at night - see prior eval. Bottom/sole of both feet. Usually not during the day. TSH normal, lytes normal, CBC normal at may visit - Notes when lying down - both feet tingle, R greater than left..  No leg weakness, no swelling, no trouble walking.  Has been otc Vit B12 every day now - seems a little better? No other areas of tingling. No new headaches, dizziness or problems with balance. R great toe sore for one day only. Has had normal B12 elsewhere in past year.   Itching in ears - past several months,   Notes more wax in ears past 6 months.   Does sleep with ear plugs past few months, but itching before ear plugs. Finger to ear only. No qtips in ears. Tx: none.   Has not been having any further palpitations. Not checking outside BP's.  Taking lisinopril.    Review of Systems  HENT: Negative for hearing loss, ear pain (itching. ), tinnitus and ear discharge.   Musculoskeletal: Positive for arthralgias (as above - R foot once. ).  Neurological: Positive for numbness. Negative for dizziness, tremors and weakness.   Otherwise as above.     Objective:   Physical Exam  Vitals reviewed. Constitutional: She is oriented to person, place, and time. She appears well-developed and well-nourished. No distress.  HENT:  Head: Normocephalic and atraumatic.  Right Ear: Tympanic membrane and external ear normal. No decreased hearing is noted.  Left Ear: Tympanic membrane and external ear normal. No decreased hearing is noted.  Ears:  Eyes: Conjunctivae and EOM are normal. Pupils are equal, round, and reactive to light.  Neck: Carotid bruit is not present.  Cardiovascular: Normal rate, regular rhythm, normal heart sounds and intact  distal pulses.   Pulmonary/Chest: Effort normal and breath sounds normal.  Abdominal: Soft. She exhibits no pulsatile midline mass. There is no tenderness.  Musculoskeletal:       Left foot: She exhibits normal range of motion, no bony tenderness, no swelling and no deformity.       Feet:  Neurological: She is alert and oriented to person, place, and time. She has normal strength. No sensory deficit.  Skin: Skin is warm and dry.  Psychiatric: She has a normal mood and affect. Her behavior is normal.       Assessment & Plan:  Mary Le is a 67 y.o. female Numbness and tingling of foot,  Pes planus, unspecified laterality.  Possible metatarsalgia vs neuropathy with foot breakdown based on distal symptoms.  Uric acid pending, with single episode of toe pain, but unlikely gout. Trial of metatarsal cookies (can pick up from [employer]), but if not improving  - may need orthotics or podiatry eval.   Ear itching - no identifiable lesions on exam.  ? Dry skin with scratching. Trial of aveeno lotion, hydrocortisone otc prn. rtc precautions.   HTN (hypertension) - stable overall, with borderline reading here. Check home bp's and follow up if remain elevated out of office.   Patient Instructions  Try the shoe inserts to see if the tingling is from flat foot. You may need to see a podiatrist if this does not help. We wil  check the gout test, but this does not sound like gout.  Try aveeno lotion - small amount to fingertip on outside of ear each day, then if needed - small amount of over the counter hydrocortisone to outside of ear canal up to twice per day if still itching.  You should receive a call or letter about your lab results within the next week to 10 days.  Return to the clinic or go to the nearest emergency room if any of your symptoms worsen or new symptoms occur. Keep a record of your blood pressures outside of the office and bring them to the next office  visit.

## 2013-01-04 NOTE — Patient Instructions (Addendum)
Try the shoe inserts to see if the tingling is from flat foot. You may need to see a podiatrist if this does not help. We wil check the gout test, but this does not sound like gout.  Try aveeno lotion - small amount to fingertip on outside of ear each day, then if needed - small amount of over the counter hydrocortisone to outside of ear canal up to twice per day if still itching.  You should receive a call or letter about your lab results within the next week to 10 days.  Return to the clinic or go to the nearest emergency room if any of your symptoms worsen or new symptoms occur. Keep a record of your blood pressures outside of the office and bring them to the next office visit.

## 2013-07-26 ENCOUNTER — Encounter: Payer: Self-pay | Admitting: Family Medicine

## 2013-07-26 ENCOUNTER — Ambulatory Visit (INDEPENDENT_AMBULATORY_CARE_PROVIDER_SITE_OTHER): Payer: Medicare Other | Admitting: Family Medicine

## 2013-07-26 VITALS — BP 194/90 | HR 72 | Temp 97.7°F | Resp 16 | Ht 63.0 in | Wt 219.0 lb

## 2013-07-26 DIAGNOSIS — M545 Low back pain, unspecified: Secondary | ICD-10-CM

## 2013-07-26 DIAGNOSIS — I10 Essential (primary) hypertension: Secondary | ICD-10-CM

## 2013-07-26 DIAGNOSIS — Z23 Encounter for immunization: Secondary | ICD-10-CM | POA: Diagnosis not present

## 2013-07-26 MED ORDER — CELECOXIB 200 MG PO CAPS: 200.0000 mg | ORAL_CAPSULE | Freq: Two times a day (BID) | ORAL | Status: DC

## 2013-07-26 MED ORDER — LISINOPRIL 20 MG PO TABS: 20.0000 mg | ORAL_TABLET | Freq: Every day | ORAL | Status: DC

## 2013-07-26 NOTE — Patient Instructions (Addendum)
Medicine next to toothbrush to help remember to take your lisinopril.  return in next 2 weeks for lab only order. Make sure you drink plenty of water that day.  Keep a record of your blood pressures outside of the office and bring them to the next office visit. If running over 140/90 at home on medicine - return to discuss possible change in medicines.  For your back - see below and the back care manual for treatments and exercises.  Tylenol is best with high blood pressure, but if this is not helping - occasional celebrex, up to once per day if needed. If back pain not improving in next 4-6 weeks, recheck for possible xrays. Return to the clinic or go to the nearest emergency room if any of your symptoms worsen or new symptoms occur. Back Pain, Adult Low back pain is very common. About 1 in 5 people have back pain.The cause of low back pain is rarely dangerous. The pain often gets better over time.About half of people with a sudden onset of back pain feel better in just 2 weeks. About 8 in 10 people feel better by 6 weeks.  CAUSES Some common causes of back pain include:  Strain of the muscles or ligaments supporting the spine.  Wear and tear (degeneration) of the spinal discs.  Arthritis.  Direct injury to the back. DIAGNOSIS Most of the time, the direct cause of low back pain is not known.However, back pain can be treated effectively even when the exact cause of the pain is unknown.Answering your caregiver's questions about your overall health and symptoms is one of the most accurate ways to make sure the cause of your pain is not dangerous. If your caregiver needs more information, he or she may order lab work or imaging tests (X-rays or MRIs).However, even if imaging tests show changes in your back, this usually does not require surgery. HOME CARE INSTRUCTIONS For many people, back pain returns.Since low back pain is rarely dangerous, it is often a condition that people can learn to  [facility] their own.   Remain active. It is stressful on the back to sit or stand in one place. Do not sit, drive, or stand in one place for more than 30 minutes at a time. Take short walks on level surfaces as soon as pain allows.Try to increase the length of time you walk each day.  Do not stay in bed.Resting more than 1 or 2 days can delay your recovery.  Do not avoid exercise or work.Your body is made to move.It is not dangerous to be active, even though your back may hurt.Your back will likely heal faster if you return to being active before your pain is gone.  Pay attention to your body when you bend and lift. Many people have less discomfortwhen lifting if they bend their knees, keep the load close to their bodies,and avoid twisting. Often, the most comfortable positions are those that put less stress on your recovering back.  Find a comfortable position to sleep. Use a firm mattress and lie on your side with your knees slightly bent. If you lie on your back, put a pillow under your knees.  Only take over-the-counter or prescription medicines as directed by your caregiver. Over-the-counter medicines to reduce pain and inflammation are often the most helpful.Your caregiver may prescribe muscle relaxant drugs.These medicines help dull your pain so you can more quickly return to your normal activities and healthy exercise.  Put ice on the injured area.  Put ice in a plastic bag.  Place a towel between your skin and the bag.  Leave the ice on for 15-20 minutes, 03-04 times a day for the first 2 to 3 days. After that, ice and heat may be alternated to reduce pain and spasms.  Ask your caregiver about trying back exercises and gentle massage. This may be of some benefit.  Avoid feeling anxious or stressed.Stress increases muscle tension and can worsen back pain.It is important to recognize when you are anxious or stressed and learn ways to manage it.Exercise is a great  option. SEEK MEDICAL CARE IF:  You have pain that is not relieved with rest or medicine.  You have pain that does not improve in 1 week.  You have new symptoms.  You are generally not feeling well. SEEK IMMEDIATE MEDICAL CARE IF:   You have pain that radiates from your back into your legs.  You develop new bowel or bladder control problems.  You have unusual weakness or numbness in your arms or legs.  You develop nausea or vomiting.  You develop abdominal pain.  You feel faint. Document Released: 07/08/2005 Document Revised: 01/07/2012 Document Reviewed: 11/26/2010 [facility] Patient Information 2014 [place], [place].

## 2013-07-26 NOTE — Progress Notes (Signed)
Subjective:    Patient ID: Mary Le, female    DOB: 11/26/1945, 68 y.o.   MRN: [id number]  HPI Mary Le is a 68 y.o. female Here for follow up.  HTN - home BP's: highest 140/70. Usually 125-130/67-72.  Occasional misses doses - about 2 times per week, forgets to take in am. Did not take meds today - forgot. If off schedule, forgets. No new side effects. No new cough.    Chemistry      Component Value Date/Time   NA 141 11/23/2012 1446   K 4.2 11/23/2012 1446   CL 107 11/23/2012 1446   CO2 22 11/23/2012 1446   BUN 10 11/23/2012 1446   CREATININE 0.75 11/23/2012 1446   CREATININE 0.77 06/03/2011 1816      Component Value Date/Time   CALCIUM 9.0 11/23/2012 1446   ALKPHOS 44 11/23/2012 1446   AST 17 11/23/2012 1446   ALT 24 11/23/2012 1446   BILITOT 0.5 11/23/2012 1446       Backache - comes and goes, lower, NKI, on and off for years. Noticed more past few months. No bowel or bladder incontinence, no saddle anesthesia, no lower extremity weakness. No radiation of pain. Hard stools at times, BM qd. Not straining. drinking plenty of water, urinating ok. Tx: alleve twice per day, topical patch.  Has taken celebrex with better relief in past.  Off this past month.   There are no active problems to display for this patient.  Past Medical History  Diagnosis Date  . Hypertension   . Hypercholesteremia    Past Surgical History  Procedure Laterality Date  . Appendectomy    . Tonsillectomy    . Hemorroidectomy    . Tubal ligation     Allergies  Allergen Reactions  . Aspirin Nausea And Vomiting  . Statins Other (See Comments)    Myalgias and chest pain   Prior to Admission medications   Medication Sig Start Date End Date Taking? Authorizing Provider  acetaminophen (TYLENOL) 325 MG tablet Take 650 mg by mouth every 6 (six) hours as needed. For pain    Yes Historical Provider, MD  Garlic 921 MG TABS Take 100 mg by mouth daily.     Yes Historical Provider, MD  Javier Docker Oil 300 MG CAPS Take by mouth  daily.   Yes Historical Provider, MD  lisinopril (PRINIVIL,ZESTRIL) 20 MG tablet Take 1 tablet (20 mg total) by mouth daily. 11/23/12  Yes [name], MD  B Complex Vitamins (B COMPLEX-B12 PO) Take by mouth.    Historical Provider, MD  fish oil-omega-3 fatty acids 1000 MG capsule Take 2 g by mouth daily.    Historical Provider, MD  niacin 500 MG tablet Take 500 mg by mouth daily with breakfast.    Historical Provider, MD   History   Social History  . Marital Status: Married    Spouse Name: N/A    Number of Children: N/A  . Years of Education: N/A   Occupational History  . Not on file.   Social History Main Topics  . Smoking status: Former Research scientist (life sciences)  . Smokeless tobacco: Never Used  . Alcohol Use: Yes  . Drug Use: No  . Sexual Activity: Not on file   Other Topics Concern  . Not on file   Social History Narrative   Married. Education: [employer].        Review of Systems  Genitourinary: Negative for dysuria, hematuria, flank pain, decreased urine volume and difficulty urinating.  Objective:   Physical Exam  Vitals reviewed. Constitutional: She is oriented to person, place, and time. She appears well-developed and well-nourished.  HENT:  Head: Normocephalic and atraumatic.  Eyes: Conjunctivae and EOM are normal. Pupils are equal, round, and reactive to light.  Neck: Carotid bruit is not present.  Cardiovascular: Normal rate, regular rhythm, normal heart sounds and intact distal pulses.   Pulmonary/Chest: Effort normal and breath sounds normal.  Abdominal: Soft. She exhibits no pulsatile midline mass. There is no tenderness.  Musculoskeletal:       Lumbar back: She exhibits normal range of motion, no bony tenderness, no edema and no spasm.       Back:  Able to heel/toe walk without difficulty.   Neurological: She is alert and oriented to person, place, and time. She has normal strength. No sensory deficit. She displays no Babinski's sign on the right side. She  displays no Babinski's sign on the left side.  Reflex Scores:      Patellar reflexes are 2+ on the right side and 2+ on the left side.      Achilles reflexes are 2+ on the right side and 2+ on the left side. Negative SLR.   Skin: Skin is warm and dry.  Psychiatric: She has a normal mood and affect. Her behavior is normal.      Assessment & Plan:   Mary Le is a 68 y.o. female Need for prophylactic vaccination and inoculation against influenza - Plan: Flu Vaccine QUAD 36+ mos IM - flu vaccine given.   Low back pain - suspect chroinc LBP/deconditioning, as NKI. Reassuring exam. Plan:  celecoxib (CELEBREX) 200 MG capsule if needed only, and discussed risks including BP control. Start with HEP/care per back care manual, tylenol, celebrex if needed and recheck in next 6 weeks if not improving for possible XR. Sooner if worse.   HTN (hypertension) - Plan: Basic metabolic panel (futire order - attempted x 3, including by PA-C unsuccessful in office, will return for lab only order when hydrated), lisinopril (PRINIVIL,ZESTRIL) 20 MG tablet refilled. Discussed ways to remember dosing. Check home BP's on meds, and if still elevated when compliant - rtc to discuss changes. Also advised against regular use of NSAIDs as this may be contributory. rtc precautions.    Meds ordered this encounter  . celecoxib (CELEBREX) 200 MG capsule    Sig: Take 1 capsule (200 mg total) by mouth 2 (two) times daily.    Dispense:  30 capsule    Refill:  0  . lisinopril (PRINIVIL,ZESTRIL) 20 MG tablet    Sig: Take 1 tablet (20 mg total) by mouth daily.    Dispense:  90 tablet    Refill:  1   Patient Instructions  Medicine next to toothbrush to help remember to take this.  Keep a record of your blood pressures outside of the office and bring them to the next office visit. If running over 140/90 at home on medicine - return to discuss possible change in medicines.  For your back - see below and the back care manual for  treatments and exercises.  Tylenol is best with high blood pressure, but if this is not helping - occasional celebrex, up to once per day if needed. If back pain not improving in next 4-6 weeks, recheck for possible xrays. Return to the clinic or go to the nearest emergency room if any of your symptoms worsen or new symptoms occur. Back Pain, Adult Low back pain is very common.  About 1 in 5 people have back pain.The cause of low back pain is rarely dangerous. The pain often gets better over time.About half of people with a sudden onset of back pain feel better in just 2 weeks. About 8 in 10 people feel better by 6 weeks.  CAUSES Some common causes of back pain include:  Strain of the muscles or ligaments supporting the spine.  Wear and tear (degeneration) of the spinal discs.  Arthritis.  Direct injury to the back. DIAGNOSIS Most of the time, the direct cause of low back pain is not known.However, back pain can be treated effectively even when the exact cause of the pain is unknown.Answering your caregiver's questions about your overall health and symptoms is one of the most accurate ways to make sure the cause of your pain is not dangerous. If your caregiver needs more information, he or she may order lab work or imaging tests (X-rays or MRIs).However, even if imaging tests show changes in your back, this usually does not require surgery. HOME CARE INSTRUCTIONS For many people, back pain returns.Since low back pain is rarely dangerous, it is often a condition that people can learn to [facility] their own.   Remain active. It is stressful on the back to sit or stand in one place. Do not sit, drive, or stand in one place for more than 30 minutes at a time. Take short walks on level surfaces as soon as pain allows.Try to increase the length of time you walk each day.  Do not stay in bed.Resting more than 1 or 2 days can delay your recovery.  Do not avoid exercise or work.Your body is made  to move.It is not dangerous to be active, even though your back may hurt.Your back will likely heal faster if you return to being active before your pain is gone.  Pay attention to your body when you bend and lift. Many people have less discomfortwhen lifting if they bend their knees, keep the load close to their bodies,and avoid twisting. Often, the most comfortable positions are those that put less stress on your recovering back.  Find a comfortable position to sleep. Use a firm mattress and lie on your side with your knees slightly bent. If you lie on your back, put a pillow under your knees.  Only take over-the-counter or prescription medicines as directed by your caregiver. Over-the-counter medicines to reduce pain and inflammation are often the most helpful.Your caregiver may prescribe muscle relaxant drugs.These medicines help dull your pain so you can more quickly return to your normal activities and healthy exercise.  Put ice on the injured area.  Put ice in a plastic bag.  Place a towel between your skin and the bag.  Leave the ice on for 15-20 minutes, 03-04 times a day for the first 2 to 3 days. After that, ice and heat may be alternated to reduce pain and spasms.  Ask your caregiver about trying back exercises and gentle massage. This may be of some benefit.  Avoid feeling anxious or stressed.Stress increases muscle tension and can worsen back pain.It is important to recognize when you are anxious or stressed and learn ways to manage it.Exercise is a great option. SEEK MEDICAL CARE IF:  You have pain that is not relieved with rest or medicine.  You have pain that does not improve in 1 week.  You have new symptoms.  You are generally not feeling well. SEEK IMMEDIATE MEDICAL CARE IF:   You have pain  that radiates from your back into your legs.  You develop new bowel or bladder control problems.  You have unusual weakness or numbness in your arms or legs.  You  develop nausea or vomiting.  You develop abdominal pain.  You feel faint. Document Released: 07/08/2005 Document Revised: 01/07/2012 Document Reviewed: 11/26/2010 [facility] Patient Information 2014 [place], [place].

## 2013-08-03 ENCOUNTER — Telehealth: Payer: Self-pay

## 2013-08-03 DIAGNOSIS — M549 Dorsalgia, unspecified: Secondary | ICD-10-CM

## 2013-08-03 NOTE — Telephone Encounter (Signed)
PA needed for Celebrex. Pt clarified she tried Rx strengths of Advil and Aleve before, and also Tramadol which was too strong for her. She has used Celebrex in the past w/good effect and no SEs. Completed on covermymeds.

## 2013-08-05 MED ORDER — MELOXICAM 7.5 MG PO TABS: 7.5000 mg | ORAL_TABLET | Freq: Every day | ORAL | Status: DC

## 2013-08-05 NOTE — Telephone Encounter (Signed)
Please let her know that celebrex was not authorized, even with attempt at prior auth. Ok to try mobic - 7.5 to 15mg  qd prn. i will send this in, and can start with lowest effective dose. Let me know if there are any questions.

## 2013-08-05 NOTE — Telephone Encounter (Signed)
Pt.notified

## 2013-08-05 NOTE — Telephone Encounter (Signed)
PA was denied. Pt needs to have tried/failed all formulary alternatives: meloxicam, diclofenac, and nabumetone. Dr Carlota Raspberry, do you want to Rx one of these?

## 2013-08-10 ENCOUNTER — Other Ambulatory Visit (INDEPENDENT_AMBULATORY_CARE_PROVIDER_SITE_OTHER): Payer: Medicare Other | Admitting: *Deleted

## 2013-08-10 DIAGNOSIS — I10 Essential (primary) hypertension: Secondary | ICD-10-CM | POA: Diagnosis not present

## 2013-08-10 LAB — BASIC METABOLIC PANEL
BUN: 15 mg/dL (ref 6–23)
CHLORIDE: 104 meq/L (ref 96–112)
CO2: 25 mEq/L (ref 19–32)
Calcium: 8.9 mg/dL (ref 8.4–10.5)
Creat: 0.8 mg/dL (ref 0.50–1.10)
Glucose, Bld: 86 mg/dL (ref 70–99)
POTASSIUM: 4.3 meq/L (ref 3.5–5.3)
Sodium: 137 mEq/L (ref 135–145)

## 2013-08-10 NOTE — Progress Notes (Signed)
Patient here for labs only. 

## 2013-08-12 ENCOUNTER — Encounter: Payer: Self-pay | Admitting: Family Medicine

## 2013-08-30 DIAGNOSIS — R209 Unspecified disturbances of skin sensation: Secondary | ICD-10-CM | POA: Diagnosis not present

## 2013-08-30 DIAGNOSIS — I200 Unstable angina: Secondary | ICD-10-CM | POA: Diagnosis not present

## 2013-08-30 DIAGNOSIS — R6884 Jaw pain: Secondary | ICD-10-CM | POA: Diagnosis not present

## 2013-08-30 DIAGNOSIS — E785 Hyperlipidemia, unspecified: Secondary | ICD-10-CM | POA: Diagnosis not present

## 2013-08-30 DIAGNOSIS — E236 Other disorders of pituitary gland: Secondary | ICD-10-CM | POA: Diagnosis not present

## 2013-08-30 DIAGNOSIS — R42 Dizziness and giddiness: Secondary | ICD-10-CM | POA: Diagnosis not present

## 2013-08-30 DIAGNOSIS — R51 Headache: Secondary | ICD-10-CM | POA: Diagnosis not present

## 2013-08-30 DIAGNOSIS — I10 Essential (primary) hypertension: Secondary | ICD-10-CM | POA: Diagnosis not present

## 2013-08-30 DIAGNOSIS — R079 Chest pain, unspecified: Secondary | ICD-10-CM | POA: Diagnosis not present

## 2013-08-31 DIAGNOSIS — R209 Unspecified disturbances of skin sensation: Secondary | ICD-10-CM | POA: Diagnosis not present

## 2013-08-31 DIAGNOSIS — E785 Hyperlipidemia, unspecified: Secondary | ICD-10-CM | POA: Diagnosis not present

## 2013-08-31 DIAGNOSIS — I10 Essential (primary) hypertension: Secondary | ICD-10-CM | POA: Diagnosis not present

## 2013-08-31 DIAGNOSIS — I200 Unstable angina: Secondary | ICD-10-CM | POA: Diagnosis not present

## 2013-10-15 DIAGNOSIS — M76899 Other specified enthesopathies of unspecified lower limb, excluding foot: Secondary | ICD-10-CM | POA: Diagnosis not present

## 2013-10-15 DIAGNOSIS — M4310 Spondylolisthesis, site unspecified: Secondary | ICD-10-CM | POA: Diagnosis not present

## 2013-10-27 ENCOUNTER — Other Ambulatory Visit: Payer: Self-pay

## 2013-10-27 DIAGNOSIS — I10 Essential (primary) hypertension: Secondary | ICD-10-CM

## 2013-10-27 NOTE — Telephone Encounter (Signed)
Pt has sch 1st avail appt w/Dr Carlota Raspberry. She reported that she had an "episode" consisting of some L sided numbness and tingling in fingers while in [place]. She went to ER and had a complete work up but they did not find anything abnormal except her BP was high, 180s/90s. They added metoprolol ER 25 mg QD to the lisinopril she has been on. Pt reports she takes her BP most every day and it has improved and usually 130s/60-70s. Pt would like Rx for metoprolol in addition to lisinopril until appt if possible, but stated she will come in sooner or see someone else if she needs to before her June appt. Pended, please advise.

## 2013-10-27 NOTE — Telephone Encounter (Signed)
[facility] sent req for lisinopril and metoprolol. Pt has another 90 day RF at local pharm for lisinopril so I can send this to mail order, but we do not have a record of metoprolol. Pls get details.

## 2013-10-29 MED ORDER — LISINOPRIL 20 MG PO TABS: 20.0000 mg | ORAL_TABLET | Freq: Every day | ORAL | Status: DC

## 2013-10-29 MED ORDER — METOPROLOL SUCCINATE ER 25 MG PO TB24: 25.0000 mg | ORAL_TABLET | Freq: Every day | ORAL | Status: DC

## 2013-10-29 NOTE — Telephone Encounter (Signed)
Ok. Done 

## 2013-11-03 DIAGNOSIS — M4310 Spondylolisthesis, site unspecified: Secondary | ICD-10-CM | POA: Diagnosis not present

## 2013-11-03 DIAGNOSIS — M76899 Other specified enthesopathies of unspecified lower limb, excluding foot: Secondary | ICD-10-CM | POA: Diagnosis not present

## 2013-11-09 DIAGNOSIS — M545 Low back pain, unspecified: Secondary | ICD-10-CM | POA: Diagnosis not present

## 2013-11-09 DIAGNOSIS — M76899 Other specified enthesopathies of unspecified lower limb, excluding foot: Secondary | ICD-10-CM | POA: Diagnosis not present

## 2013-11-16 DIAGNOSIS — M545 Low back pain, unspecified: Secondary | ICD-10-CM | POA: Diagnosis not present

## 2013-11-16 DIAGNOSIS — M76899 Other specified enthesopathies of unspecified lower limb, excluding foot: Secondary | ICD-10-CM | POA: Diagnosis not present

## 2013-11-23 DIAGNOSIS — M545 Low back pain, unspecified: Secondary | ICD-10-CM | POA: Diagnosis not present

## 2013-11-23 DIAGNOSIS — M76899 Other specified enthesopathies of unspecified lower limb, excluding foot: Secondary | ICD-10-CM | POA: Diagnosis not present

## 2013-12-02 ENCOUNTER — Other Ambulatory Visit: Payer: Self-pay

## 2013-12-02 DIAGNOSIS — Z1231 Encounter for screening mammogram for malignant neoplasm of breast: Secondary | ICD-10-CM

## 2013-12-07 DIAGNOSIS — M545 Low back pain, unspecified: Secondary | ICD-10-CM | POA: Diagnosis not present

## 2013-12-07 DIAGNOSIS — M76899 Other specified enthesopathies of unspecified lower limb, excluding foot: Secondary | ICD-10-CM | POA: Diagnosis not present

## 2013-12-30 ENCOUNTER — Encounter (INDEPENDENT_AMBULATORY_CARE_PROVIDER_SITE_OTHER): Payer: Self-pay

## 2013-12-30 ENCOUNTER — Ambulatory Visit
Admission: RE | Admit: 2013-12-30 | Discharge: 2013-12-30 | Disposition: A | Discharge status: Home / Self Care | Payer: Medicare Other | Source: Ambulatory Visit

## 2013-12-30 DIAGNOSIS — Z1231 Encounter for screening mammogram for malignant neoplasm of breast: Secondary | ICD-10-CM

## 2014-01-03 ENCOUNTER — Ambulatory Visit (INDEPENDENT_AMBULATORY_CARE_PROVIDER_SITE_OTHER): Payer: Medicare Other | Admitting: Family Medicine

## 2014-01-03 ENCOUNTER — Encounter: Payer: Self-pay | Admitting: Family Medicine

## 2014-01-03 VITALS — BP 168/68 | HR 86 | Temp 97.9°F | Resp 16 | Ht 63.5 in | Wt 220.0 lb

## 2014-01-03 DIAGNOSIS — R059 Cough, unspecified: Secondary | ICD-10-CM | POA: Diagnosis not present

## 2014-01-03 DIAGNOSIS — I10 Essential (primary) hypertension: Secondary | ICD-10-CM | POA: Diagnosis not present

## 2014-01-03 DIAGNOSIS — R0789 Other chest pain: Secondary | ICD-10-CM

## 2014-01-03 DIAGNOSIS — R12 Heartburn: Secondary | ICD-10-CM

## 2014-01-03 DIAGNOSIS — M549 Dorsalgia, unspecified: Secondary | ICD-10-CM | POA: Diagnosis not present

## 2014-01-03 DIAGNOSIS — Z23 Encounter for immunization: Secondary | ICD-10-CM

## 2014-01-03 DIAGNOSIS — R05 Cough: Secondary | ICD-10-CM

## 2014-01-03 DIAGNOSIS — R071 Chest pain on breathing: Secondary | ICD-10-CM

## 2014-01-03 DIAGNOSIS — Z0000 Encounter for general adult medical examination without abnormal findings: Secondary | ICD-10-CM | POA: Diagnosis not present

## 2014-01-03 DIAGNOSIS — M25559 Pain in unspecified hip: Secondary | ICD-10-CM

## 2014-01-03 DIAGNOSIS — K5900 Constipation, unspecified: Secondary | ICD-10-CM

## 2014-01-03 DIAGNOSIS — M25552 Pain in left hip: Secondary | ICD-10-CM

## 2014-01-03 DIAGNOSIS — Z733 Stress, not elsewhere classified: Secondary | ICD-10-CM

## 2014-01-03 DIAGNOSIS — F439 Reaction to severe stress, unspecified: Secondary | ICD-10-CM

## 2014-01-03 LAB — CBC WITH DIFFERENTIAL/PLATELET
Basophils Absolute: 0 10*3/uL (ref 0.0–0.1)
Basophils Relative: 0 % (ref 0–1)
EOS PCT: 3 % (ref 0–5)
Eosinophils Absolute: 0.2 10*3/uL (ref 0.0–0.7)
HEMATOCRIT: 42.8 % (ref 36.0–46.0)
HEMOGLOBIN: 14.3 g/dL (ref 12.0–15.0)
LYMPHS ABS: 2.1 10*3/uL (ref 0.7–4.0)
LYMPHS PCT: 28 % (ref 12–46)
MCH: 27.2 pg (ref 26.0–34.0)
MCHC: 33.4 g/dL (ref 30.0–36.0)
MCV: 81.4 fL (ref 78.0–100.0)
MONO ABS: 0.6 10*3/uL (ref 0.1–1.0)
Monocytes Relative: 8 % (ref 3–12)
Neutro Abs: 4.6 10*3/uL (ref 1.7–7.7)
Neutrophils Relative %: 61 % (ref 43–77)
Platelets: 304 10*3/uL (ref 150–400)
RBC: 5.26 MIL/uL — AB (ref 3.87–5.11)
RDW: 14.6 % (ref 11.5–15.5)
WBC: 7.6 10*3/uL (ref 4.0–10.5)

## 2014-01-03 LAB — POCT URINALYSIS DIPSTICK
Bilirubin, UA: NEGATIVE
Blood, UA: NEGATIVE
GLUCOSE UA: NEGATIVE
KETONES UA: NEGATIVE
Leukocytes, UA: NEGATIVE
Nitrite, UA: NEGATIVE
PROTEIN UA: NEGATIVE
SPEC GRAV UA: 1.025
Urobilinogen, UA: 0.2
pH, UA: 5.5

## 2014-01-03 LAB — COMPLETE METABOLIC PANEL WITH GFR
ALBUMIN: 3.8 g/dL (ref 3.5–5.2)
ALT: 21 U/L (ref 0–35)
AST: 15 U/L (ref 0–37)
Alkaline Phosphatase: 50 U/L (ref 39–117)
BUN: 14 mg/dL (ref 6–23)
CALCIUM: 9.1 mg/dL (ref 8.4–10.5)
CHLORIDE: 106 meq/L (ref 96–112)
CO2: 25 meq/L (ref 19–32)
CREATININE: 0.79 mg/dL (ref 0.50–1.10)
GFR, Est Non African American: 78 mL/min
Glucose, Bld: 94 mg/dL (ref 70–99)
Potassium: 4.2 mEq/L (ref 3.5–5.3)
Sodium: 138 mEq/L (ref 135–145)
Total Bilirubin: 0.5 mg/dL (ref 0.2–1.2)
Total Protein: 6.6 g/dL (ref 6.0–8.3)

## 2014-01-03 LAB — LIPID PANEL
CHOLESTEROL: 255 mg/dL — AB (ref 0–200)
HDL: 48 mg/dL (ref >39)
LDL CALC: 178 mg/dL — AB (ref 0–99)
TRIGLYCERIDES: 146 mg/dL (ref <150)
Total CHOL/HDL Ratio: 5.3 Ratio
VLDL: 29 mg/dL (ref 0–40)

## 2014-01-03 LAB — TSH: TSH: 0.98 u[IU]/mL (ref 0.350–4.500)

## 2014-01-03 MED ORDER — MELOXICAM 7.5 MG PO TABS: 7.5000 mg | ORAL_TABLET | Freq: Every day | ORAL | Status: DC

## 2014-01-03 NOTE — Progress Notes (Signed)
Subjective:    Patient ID: Mary Le, female    DOB: [birth year], 68 y.o.   MRN: [id number]  HPI Mary Le is a 68 y.o. female  Here for physical.  Subsequent MCR wellness physical.  Fall screening - per screening tool - balance ok, no recent falls.  Depression screening - denies depressed mood/ahedonia.  Colonoscopy:approx 2009, repeat in 23yrs.  Tetanus/Tdap: 2008 Zostavax: has not had. Has Rx if needed, can call if expired.  Pneumovax: given 11/2012. Has not had Prevnar - will have today.  Dentist: not had done recently.  Advanced directives discussed prior. Planned to put in writing. Pap last year normal. Normal until that time.  Mammogram: normal June 12th.   Back pain - Had physical therapy for back in April - May. Was improving, but had to stop PT d/t other things going on - daughter in chemotherapy. Has support needed. Feels like she is dealing with this ok now. Followed by Dr. Hal Morales for back and hip pain. Rx for meloxicam. Taking every day with some relief.  No upcoming appt with Dr. Layne Benton, has to reschedule.  S/p hip bursitis injection that helped.  Has HEP.    HTN - not checking at home recently, less missed doses than last ov. Missed last 2 days doses.   Has had joint aches  - back,  hips, and then in lower rib area. Only notes in ribs at night with lying down. No orthopnea.  No chest pains. Had EKG in February in [place]. Numbness in fingers then.  EKG was ok then.  Had overnight testing while hospitalized that was ok.    Review of Systems 13 point review of systems per patient health survey noted.  Negative other than as indicated on reviewed nursing note or above.   Cough, and short of breath, and nervous feeling in throat only when stressed, not at other times. Current cough past 4-5 days., no fever.  Does not feel like needs to meet with therapist. No cough today. Taking zantac for heartburn. Helps heartburn.   Constipation - about 3 bm's per week. Tries to eat  some fiber.  Thinks schedule is contributing to normal BM's.,      Objective:   Physical Exam  Vitals reviewed. Constitutional: She is oriented to person, place, and time. She appears well-developed and well-nourished.  HENT:  Head: Normocephalic and atraumatic.  Right Ear: External ear normal.  Left Ear: External ear normal.  Mouth/Throat: Oropharynx is clear and moist.  Eyes: Conjunctivae are normal. Pupils are equal, round, and reactive to light.  Neck: Normal range of motion. Neck supple. No thyromegaly present.  Cardiovascular: Normal rate, regular rhythm, normal heart sounds and intact distal pulses.   No murmur heard. Pulmonary/Chest: Effort normal and breath sounds normal. No respiratory distress. She has no wheezes. She exhibits tenderness and bony tenderness.    Abdominal: Soft. Bowel sounds are normal. There is no tenderness.  Musculoskeletal: Normal range of motion. She exhibits no edema and no tenderness.       Back:  Lymphadenopathy:    She has no cervical adenopathy.  Neurological: She is alert and oriented to person, place, and time.  Skin: Skin is warm and dry. No rash noted.  Psychiatric: She has a normal mood and affect. Her behavior is normal. Thought content normal.    Visual Acuity Screening   Right eye Left eye Both eyes  Without correction:     With correction: 20/20 20/20 20/20  Assessment & Plan:   Mary Le is a 68 y.o. female Routine general medical examination at a health care facility - Plan: POCT urinalysis dipstick, COMPLETE METABOLIC PANEL WITH GFR  --anticipatory guidance as below in AVS, screening labs above. Health maintenance items as above in HPI discussed/recommended as applicable.   Back pain - Plan: meloxicam (MOBIC) 7.5 MG tablet  - refilled mobic, but recommended recheck with Dr. Layne Benton.   Cough - clear on exam, VSS. ? Anxiety component vs LPR from GERD. Check CBC, incr zantac to daily or start PPI QD, but if persists -  rtc for eval.   Heartburn - H2 or ppi as above.   Chest wall pain - sx care, mobic.  If persists, rtc for repeat eval.   HTN (hypertension) - Plan: Lipid panel, TSH - elevated in office. Check home BP's and if remains elevated - rtc for possible med change - goal below 150/90.   Left hip pain - trochanteric bursitis by hx. Follow up with Dr. Layne Benton as above.   Situational stress - daughter undergoing chemotherapy. declined counseling at this point. Can call/email if she would like some names/numbers.   Unspecified constipation - cont fiber in diet, discussed scheduled times for BM, especially after meals, and trying to avoid delaying BM.   Need for prophylactic vaccination against Streptococcus pneumoniae (pneumococcus) - prevnar given.   Meds ordered this encounter  Medications  . meloxicam (MOBIC) 7.5 MG tablet    Sig: Take 1-2 tablets (7.5-15 mg total) by mouth daily.    Dispense:  30 tablet    Refill:  1   Patient Instructions  You should receive a call or letter about your lab results within the next week to 10 days.  Try zantac every day or prilosec for heartburn as this may cause cough. If cough not improving in next week to 10 days - return to discuss further.  mobic as discussed, but follow up with Dr. Hal Morales. If pain along ribs changes - return for recheck. Return to the clinic or go to the nearest emergency room if any of your symptoms worsen or new symptoms occur. Keep a record of your blood pressures outside of the office and if running over 150/90, may need to change meds.  Let me know. Your other pneumonia vaccine was given today, so you are caught up now.   Let me know if you would like numbers for therapist/counselor.   Keeping You Healthy  Get These Tests  Blood Pressure- Have your blood pressure checked by your healthcare provider at least once a year.  Normal blood pressure is 120/80.  Weight- Have your body mass index (BMI) calculated to screen for  obesity.  BMI is a measure of body fat based on height and weight.  You can calculate your own BMI at [employer]  Cholesterol- Have your cholesterol checked every year.  Diabetes- Have your blood sugar checked every year if you have high blood pressure, high cholesterol, a family history of diabetes or if you are overweight.  Pap Smear- Have a pap smear every 1 to 3 years if you have been sexually active.  If you are older than 65 and recent pap smears have been normal you may not need additional pap smears.  In addition, if you have had a hysterectomy  For benign disease additional pap smears are not necessary.  Mammogram-Yearly mammograms are essential for early detection of breast cancer  Screening for Colon Cancer- Colonoscopy starting at age 32. Screening  may begin sooner depending on your family history and other health conditions.  Follow up colonoscopy as directed by your Gastroenterologist.  Screening for Osteoporosis- Screening begins at age 36 with bone density scanning, sooner if you are at higher risk for developing Osteoporosis.  Get these medicines  Calcium with Vitamin D- Your body requires 1200-1500 mg of Calcium a day and [phone] IU of Vitamin D a day.  You can only absorb 500 mg of Calcium at a time therefore Calcium must be taken in 2 or 3 separate doses throughout the day.  Hormones- Hormone therapy has been associated with increased risk for certain cancers and heart disease.  Talk to your healthcare provider about if you need relief from menopausal symptoms.  Aspirin- Ask your healthcare provider about taking Aspirin to prevent Heart Disease and Stroke.  Get these Immuniztions  Flu shot- Every fall  Pneumonia shot- Once after the age of 78; if you are younger ask your healthcare provider if you need a pneumonia shot.  Tetanus- Every ten years.  Zostavax- Once after the age of 46 to prevent shingles.  Take these steps  Don't smoke- Your  healthcare provider can help you quit. For tips on how to quit, ask your healthcare provider or go to www.smokefree.gov or call 1-800 QUIT-NOW.  Be physically active- Exercise 5 days a week for a minimum of 30 minutes.  If you are not already physically active, start slow and gradually work up to 30 minutes of moderate physical activity.  Try walking, dancing, bike riding, swimming, etc.  Eat a healthy diet- Eat a variety of healthy foods such as fruits, vegetables, whole grains, low fat milk, low fat cheeses, yogurt, lean meats, chicken, fish, eggs, dried beans, tofu, etc.  For more information go to www.thenutritionsource.org  Dental visit- Brush and floss teeth twice daily; visit your dentist twice a year.  Eye exam- Visit your Optometrist or Ophthalmologist yearly.  Drink alcohol in moderation- Limit alcohol intake to one drink or less a day.  Never drink and drive.  Depression- Your emotional health is as important as your physical health.  If you're feeling down or losing interest in things you normally enjoy, please talk to your healthcare provider.  Seat Belts- can save your life; always wear one  Smoke/Carbon Monoxide detectors- These detectors need to be installed on the appropriate level of your home.  Replace batteries at least once a year.  Violence- If anyone is threatening or hurting you, please tell your healthcare provider.  Living Will/ Health care power of attorney- Discuss with your healthcare provider and family.

## 2014-01-03 NOTE — Patient Instructions (Signed)
You should receive a call or letter about your lab results within the next week to 10 days.  Try zantac every day or prilosec for heartburn as this may cause cough. If cough not improving in next week to 10 days - return to discuss further.  mobic as discussed, but follow up with Dr. Hal Morales. If pain along ribs changes - return for recheck. Return to the clinic or go to the nearest emergency room if any of your symptoms worsen or new symptoms occur. Keep a record of your blood pressures outside of the office and if running over 150/90, may need to change meds.  Let me know. Your other pneumonia vaccine was given today, so you are caught up now.   Let me know if you would like numbers for therapist/counselor.   Keeping You Healthy  Get These Tests  Blood Pressure- Have your blood pressure checked by your healthcare provider at least once a year.  Normal blood pressure is 120/80.  Weight- Have your body mass index (BMI) calculated to screen for obesity.  BMI is a measure of body fat based on height and weight.  You can calculate your own BMI at [employer]  Cholesterol- Have your cholesterol checked every year.  Diabetes- Have your blood sugar checked every year if you have high blood pressure, high cholesterol, a family history of diabetes or if you are overweight.  Pap Smear- Have a pap smear every 1 to 3 years if you have been sexually active.  If you are older than 65 and recent pap smears have been normal you may not need additional pap smears.  In addition, if you have had a hysterectomy  For benign disease additional pap smears are not necessary.  Mammogram-Yearly mammograms are essential for early detection of breast cancer  Screening for Colon Cancer- Colonoscopy starting at age 13. Screening may begin sooner depending on your family history and other health conditions.  Follow up colonoscopy as directed by your Gastroenterologist.  Screening for Osteoporosis- Screening  begins at age 49 with bone density scanning, sooner if you are at higher risk for developing Osteoporosis.  Get these medicines  Calcium with Vitamin D- Your body requires 1200-1500 mg of Calcium a day and [phone] IU of Vitamin D a day.  You can only absorb 500 mg of Calcium at a time therefore Calcium must be taken in 2 or 3 separate doses throughout the day.  Hormones- Hormone therapy has been associated with increased risk for certain cancers and heart disease.  Talk to your healthcare provider about if you need relief from menopausal symptoms.  Aspirin- Ask your healthcare provider about taking Aspirin to prevent Heart Disease and Stroke.  Get these Immuniztions  Flu shot- Every fall  Pneumonia shot- Once after the age of 64; if you are younger ask your healthcare provider if you need a pneumonia shot.  Tetanus- Every ten years.  Zostavax- Once after the age of 63 to prevent shingles.  Take these steps  Don't smoke- Your healthcare provider can help you quit. For tips on how to quit, ask your healthcare provider or go to www.smokefree.gov or call 1-800 QUIT-NOW.  Be physically active- Exercise 5 days a week for a minimum of 30 minutes.  If you are not already physically active, start slow and gradually work up to 30 minutes of moderate physical activity.  Try walking, dancing, bike riding, swimming, etc.  Eat a healthy diet- Eat a variety of healthy foods such as fruits, vegetables,  whole grains, low fat milk, low fat cheeses, yogurt, lean meats, chicken, fish, eggs, dried beans, tofu, etc.  For more information go to www.thenutritionsource.org  Dental visit- Brush and floss teeth twice daily; visit your dentist twice a year.  Eye exam- Visit your Optometrist or Ophthalmologist yearly.  Drink alcohol in moderation- Limit alcohol intake to one drink or less a day.  Never drink and drive.  Depression- Your emotional health is as important as your physical health.  If you're  feeling down or losing interest in things you normally enjoy, please talk to your healthcare provider.  Seat Belts- can save your life; always wear one  Smoke/Carbon Monoxide detectors- These detectors need to be installed on the appropriate level of your home.  Replace batteries at least once a year.  Violence- If anyone is threatening or hurting you, please tell your healthcare provider.  Living Will/ Health care power of attorney- Discuss with your healthcare provider and family.

## 2014-02-06 ENCOUNTER — Other Ambulatory Visit: Payer: Self-pay

## 2014-02-06 ENCOUNTER — Observation Stay (HOSPITAL_COMMUNITY)
Admission: EM | Admit: 2014-02-06 | Discharge: 2014-02-07 | Disposition: A | Discharge status: Home / Self Care | Payer: Medicare Other | Attending: Family Medicine | Admitting: Family Medicine

## 2014-02-06 ENCOUNTER — Emergency Department (HOSPITAL_COMMUNITY): Payer: Medicare Other

## 2014-02-06 ENCOUNTER — Encounter (HOSPITAL_COMMUNITY): Payer: Self-pay | Admitting: Emergency Medicine

## 2014-02-06 DIAGNOSIS — E785 Hyperlipidemia, unspecified: Secondary | ICD-10-CM

## 2014-02-06 DIAGNOSIS — R209 Unspecified disturbances of skin sensation: Secondary | ICD-10-CM | POA: Diagnosis not present

## 2014-02-06 DIAGNOSIS — R0602 Shortness of breath: Secondary | ICD-10-CM | POA: Diagnosis not present

## 2014-02-06 DIAGNOSIS — Z87891 Personal history of nicotine dependence: Secondary | ICD-10-CM | POA: Diagnosis not present

## 2014-02-06 DIAGNOSIS — I10 Essential (primary) hypertension: Secondary | ICD-10-CM | POA: Diagnosis not present

## 2014-02-06 DIAGNOSIS — E669 Obesity, unspecified: Secondary | ICD-10-CM | POA: Diagnosis not present

## 2014-02-06 DIAGNOSIS — Z79899 Other long term (current) drug therapy: Secondary | ICD-10-CM | POA: Diagnosis not present

## 2014-02-06 DIAGNOSIS — R110 Nausea: Secondary | ICD-10-CM | POA: Diagnosis not present

## 2014-02-06 DIAGNOSIS — Z791 Long term (current) use of non-steroidal anti-inflammatories (NSAID): Secondary | ICD-10-CM | POA: Insufficient documentation

## 2014-02-06 DIAGNOSIS — R071 Chest pain on breathing: Secondary | ICD-10-CM | POA: Diagnosis not present

## 2014-02-06 DIAGNOSIS — R079 Chest pain, unspecified: Principal | ICD-10-CM | POA: Insufficient documentation

## 2014-02-06 HISTORY — DX: Dorsalgia, unspecified: M54.9

## 2014-02-06 LAB — CBC
HCT: 43.1 % (ref 36.0–46.0)
HEMOGLOBIN: 14.1 g/dL (ref 12.0–15.0)
MCH: 27.6 pg (ref 26.0–34.0)
MCHC: 32.7 g/dL (ref 30.0–36.0)
MCV: 84.3 fL (ref 78.0–100.0)
PLATELETS: 304 10*3/uL (ref 150–400)
RBC: 5.11 MIL/uL (ref 3.87–5.11)
RDW: 14.3 % (ref 11.5–15.5)
WBC: 10.6 10*3/uL — AB (ref 4.0–10.5)

## 2014-02-06 LAB — BASIC METABOLIC PANEL
Anion gap: 17 — ABNORMAL HIGH (ref 5–15)
BUN: 17 mg/dL (ref 6–23)
CO2: 21 mEq/L (ref 19–32)
CREATININE: 0.83 mg/dL (ref 0.50–1.10)
Calcium: 8.8 mg/dL (ref 8.4–10.5)
Chloride: 102 mEq/L (ref 96–112)
GFR, EST AFRICAN AMERICAN: 83 mL/min — AB (ref >90)
GFR, EST NON AFRICAN AMERICAN: 72 mL/min — AB (ref >90)
Glucose, Bld: 85 mg/dL (ref 70–99)
POTASSIUM: 5.2 meq/L (ref 3.7–5.3)
Sodium: 140 mEq/L (ref 137–147)

## 2014-02-06 LAB — I-STAT TROPONIN, ED: Troponin i, poc: 0.01 ng/mL (ref 0.00–0.08)

## 2014-02-06 LAB — TROPONIN I
Troponin I: <0.3 ng/mL (ref <0.30)
Troponin I: <0.3 ng/mL (ref <0.30)
Troponin I: <0.3 ng/mL (ref <0.30)

## 2014-02-06 MED ORDER — ONDANSETRON HCL 4 MG/2ML IJ SOLN
4.0000 mg | Freq: Once | INTRAMUSCULAR | Status: AC
  Administered 2014-02-06: 4 mg via INTRAVENOUS
  Filled 2014-02-06: qty 2

## 2014-02-06 MED ORDER — LISINOPRIL 20 MG PO TABS
20.0000 mg | ORAL_TABLET | Freq: Every day | ORAL | Status: DC
  Administered 2014-02-07: 20 mg via ORAL
  Filled 2014-02-06: qty 1

## 2014-02-06 MED ORDER — ACETAMINOPHEN 325 MG PO TABS
650.0000 mg | ORAL_TABLET | ORAL | Status: DC | PRN
  Administered 2014-02-06: 650 mg via ORAL
  Filled 2014-02-06: qty 2

## 2014-02-06 MED ORDER — ONDANSETRON HCL 4 MG/2ML IJ SOLN: 4.0000 mg | Freq: Four times a day (QID) | INTRAMUSCULAR | Status: DC | PRN

## 2014-02-06 MED ORDER — MORPHINE SULFATE 4 MG/ML IJ SOLN
4.0000 mg | Freq: Once | INTRAMUSCULAR | Status: AC
  Administered 2014-02-06: 4 mg via INTRAVENOUS
  Filled 2014-02-06: qty 1

## 2014-02-06 MED ORDER — METOPROLOL SUCCINATE ER 25 MG PO TB24
25.0000 mg | ORAL_TABLET | Freq: Every day | ORAL | Status: DC
  Administered 2014-02-07: 25 mg via ORAL
  Filled 2014-02-06: qty 1

## 2014-02-06 MED ORDER — ASPIRIN 81 MG PO CHEW
324.0000 mg | CHEWABLE_TABLET | Freq: Once | ORAL | Status: AC
  Administered 2014-02-06: 324 mg via ORAL
  Filled 2014-02-06: qty 4

## 2014-02-06 MED ORDER — GI COCKTAIL ~~LOC~~
30.0000 mL | Freq: Four times a day (QID) | ORAL | Status: DC | PRN
  Administered 2014-02-06: 30 mL via ORAL
  Filled 2014-02-06: qty 30

## 2014-02-06 MED ORDER — NITROGLYCERIN 0.4 MG SL SUBL
0.4000 mg | SUBLINGUAL_TABLET | SUBLINGUAL | Status: DC | PRN
  Administered 2014-02-06: 0.4 mg via SUBLINGUAL

## 2014-02-06 MED ORDER — HEPARIN SODIUM (PORCINE) 5000 UNIT/ML IJ SOLN
5000.0000 [IU] | Freq: Three times a day (TID) | INTRAMUSCULAR | Status: DC
  Administered 2014-02-06 – 2014-02-07 (×3): 5000 [IU] via SUBCUTANEOUS
  Filled 2014-02-06 (×4): qty 1

## 2014-02-06 NOTE — ED Notes (Signed)
Admitting MD at bedside. Will transport to 3W after assessment complete.

## 2014-02-06 NOTE — ED Notes (Signed)
She states "my chest started hurting and i started to feel nauseated and my tongue felt tingly about an hour ago."

## 2014-02-06 NOTE — H&P (Signed)
[facility] Admission History and Physical Service Pager: [phone]  Patient name: Mary Le Medical record number: [id number] Date of birth: 1945/11/23 Age: 68 y.o. Gender: female  Primary Care Provider: No primary provider on file. Consultants: none Code Status: full   Chief Complaint: chest pain  Assessment and Plan: Mary Le is a 68 y.o. female presenting with chest pain. PMH is significant for HTN, Hypercholesterolemia, and back pain.   #Chest pain: rule out ACS: left side chest pain with nausea unrelated to exercise. Pain improved with nitro. No pain with palpation. Describes pain in left flank more than central chest. Most likely NOT related to cardiac as EKG is unchanged from previous and istat trop negative. No tachycardia or pleuritic chest pain to suggest PE. No signs of infection to suggest PNA. Most likely related to reflux or ulcer as she has been taking Mobic everyday since April.  - admitted to telemetry, Dr. Osborne Oman attending  - EKG: AM  - cycle trops; istate negative.  - TSH (nrml) and lipid panel (indicates statin but intolerant) recently performed in outpatient.  - nitro PRN  - GI cocktail   #HTN: controlled.  - Continue Lisinopril  - Continue Metoprolol   #HLD: patient with history of not tolerating statins and hasn't taken one in over 4 years.  - consider changing the frequency of dosing to weekly as patient may receive some benefit.   #Chronic back pain: ongoing lower back pain. No saddle paresthesia or urinary incontinence.  History of working in [place] that involved lifting many things.  - meloxicam: currently taking it everyday. Since late April   #Constipation: reports that this has been going on all her life. She uses a generic laxative to help alleviate her symptoms.   FEN/GI: saline lock, heart healthy diet  Prophylaxis: heparin SubQ  Disposition: admitted to telemetry for ACS rule out.   History of Present  Illness: Mary Le is a 68 y.o. female presenting with chest pain. She started having chest pain this morning. It was associated with nausea and lightheadedness and a gripping on her left side. She's never had pain like this before. She felt numbness and tingling in her left arm as well. Rated the pain as a 10/10 this morning but has improved since coming to the ED and receiving Nitro. Pain is 3/10 currently. Pain was worse with lying down and better with sitting up. There was no exercise associated with her chest pain. She's had pain on deep inspiration. She's had lots of indestion after eating a [place] sandwich this morning. Her mother passed away at 82 yo due to an heart attack. She doesn't take ASA or a statin due to intolerance. Reports that she have chronic constipation but this is normal for her. She doesn't have diarrhea.    ED course: Morphine and Asa 81 mg didn't improve her symtoms. Nitro the pain has been only a tightness.  Review Of Systems: Per HPI with the following additions: See HPI  Otherwise 12 point review of systems was performed and was unremarkable.  Patient Active Problem List   Diagnosis Date Noted  . Chest pain 02/06/2014   Past Medical History: Past Medical History  Diagnosis Date  . Hypertension   . Hypercholesteremia    Past Surgical History: Past Surgical History  Procedure Laterality Date  . Appendectomy    . Tonsillectomy    . Hemorroidectomy    . Tubal ligation     Social History: History  Substance Use Topics  .  Smoking status: Former Research scientist (life sciences)  . Smokeless tobacco: Never Used  . Alcohol Use: Yes   Additional social history: none Please also refer to relevant sections of EMR.  Family History: Family History  Problem Relation Age of Onset  . Heart disease Mother   . Kidney disease Father    Allergies and Medications: Allergies  Allergen Reactions  . Aspirin Nausea And Vomiting  . Statins Other (See Comments)    Myalgias and chest pain    No current facility-administered medications on file prior to encounter.   Current Outpatient Prescriptions on File Prior to Encounter  Medication Sig Dispense Refill  . acetaminophen (TYLENOL) 325 MG tablet Take 650 mg by mouth every 6 (six) hours as needed (pain).       . B Complex Vitamins (B COMPLEX-B12 PO) Take 1 tablet by mouth daily.       . fish oil-omega-3 fatty acids 1000 MG capsule Take 1 g by mouth daily.       Marland Kitchen lisinopril (PRINIVIL,ZESTRIL) 20 MG tablet Take 1 tablet (20 mg total) by mouth daily.  90 tablet  0  . meloxicam (MOBIC) 7.5 MG tablet Take 1-2 tablets (7.5-15 mg total) by mouth daily.  30 tablet  1  . metoprolol succinate (TOPROL-XL) 25 MG 24 hr tablet Take 1 tablet (25 mg total) by mouth daily.  90 tablet  0    Objective: BP 136/50  Pulse 61  Temp(Src) 97.7 F (36.5 C) (Oral)  Resp 10  Ht 5\' 3"  (1.6 m)  Wt 220 lb (99.791 kg)  BMI 38.98 kg/m2  SpO2 100% Exam: General: NAD, alert, well appearing, cooperative with exam HEENT: [place]/AT, EOMI, supple neck  Cardiovascular: RRR, S1S2, CR brisk  Respiratory: CTAB, no wheezing or crackles  Abdomen: Soft, NTND, no HSM  Extremities: warm and well perfused, moves all freely MSK: pain upon palpation of left flank, no chest pain with palpation.   Skin: no rashes  Neuro: no gross deficits, oriented.   Labs and Imaging: CBC BMET   Recent Labs Lab 02/06/14 1122  WBC 10.6*  HGB 14.1  HCT 43.1  PLT 304    Recent Labs Lab 02/06/14 1122  NA 140  K 5.2  CL 102  CO2 21  BUN 17  CREATININE 0.83  GLUCOSE 85  CALCIUM 8.8     EKG: Sinus Rhythm.   Rosemarie Ax, MD 02/06/2014, 2:24 PM PGY-2, [place] [facility] pager: [phone], text pages welcome

## 2014-02-06 NOTE — ED Notes (Signed)
Patient returned from X-ray 

## 2014-02-06 NOTE — ED Notes (Signed)
Admitting MD still at bedside

## 2014-02-06 NOTE — ED Provider Notes (Signed)
CSN: [id number]     Arrival date & time 02/06/14  1117 History   First MD Initiated Contact with Patient 02/06/14 1139     Chief Complaint  Patient presents with  . Chest Pain     (Consider location/radiation/quality/duration/timing/severity/associated sxs/prior Treatment) HPI Pt is a 68yo female with hx of HTN and hypercholesteremia presenting to ED with c/o left sided chest pain that started about 1 hour PTA while pt was lying down at home. Pain is described as sharp and dull, waxing and waning but constant since onset, 9/10 at worst. Nothing makes pain better or worse. No pain medication taken PTA.  Pt also reports feeling nauseated as well as states her tongue felt tingly about 1hour as well.  Pt does have hx of left sided chest wall pain in 2012 but states this feels a lot worse. Denies recent illness or injury. Denies cough, congestion, fever, vomiting or diarrhea.  Denies hx of CAD, however states mother had an MI in her 46s.  Pt is a former smoker.   PCP: [name]  Past Medical History  Diagnosis Date  . Hypertension   . Hypercholesteremia    Past Surgical History  Procedure Laterality Date  . Appendectomy    . Tonsillectomy    . Hemorroidectomy    . Tubal ligation     Family History  Problem Relation Age of Onset  . Heart disease Mother   . Kidney disease Father    History  Substance Use Topics  . Smoking status: Former Research scientist (life sciences)  . Smokeless tobacco: Never Used  . Alcohol Use: Yes   OB History   Grav Para Term Preterm Abortions TAB SAB Ect Mult Living                 Review of Systems  Constitutional: Negative for fever, chills, diaphoresis and fatigue.  HENT: Negative for congestion and sore throat.   Respiratory: Negative for cough and shortness of breath.   Cardiovascular: Positive for chest pain. Negative for palpitations and leg swelling.  Gastrointestinal: Positive for nausea. Negative for vomiting, abdominal pain and diarrhea.  All other systems  reviewed and are negative.     Allergies  Aspirin and Statins  Home Medications   Prior to Admission medications   Medication Sig Start Date End Date Taking? Authorizing Provider  acetaminophen (TYLENOL) 325 MG tablet Take 650 mg by mouth every 6 (six) hours as needed (pain).    Yes Historical Provider, MD  B Complex Vitamins (B COMPLEX-B12 PO) Take 1 tablet by mouth daily.    Yes Historical Provider, MD  fish oil-omega-3 fatty acids 1000 MG capsule Take 1 g by mouth daily.    Yes Historical Provider, MD  lisinopril (PRINIVIL,ZESTRIL) 20 MG tablet Take 1 tablet (20 mg total) by mouth daily.   Yes [name], MD  loratadine (ALAVERT) 10 MG tablet Take 10 mg by mouth daily as needed for allergies.   Yes Historical Provider, MD  meloxicam (MOBIC) 7.5 MG tablet Take 1-2 tablets (7.5-15 mg total) by mouth daily. 01/03/14  Yes [name], MD  metoprolol succinate (TOPROL-XL) 25 MG 24 hr tablet Take 1 tablet (25 mg total) by mouth daily.   Yes [name], MD  Multiple Vitamin (MULTIVITAMIN WITH MINERALS) TABS tablet Take 1 tablet by mouth daily.   Yes Historical Provider, MD  Polyethyl Glycol-Propyl Glycol (SYSTANE OP) Place 1 drop into both eyes daily as needed (dry eyeys).   Yes Historical Provider, MD  BP 136/50  Pulse 61  Temp(Src) 97.7 F (36.5 C) (Oral)  Resp 10  Ht 5\' 3"  (1.6 m)  Wt 220 lb (99.791 kg)  BMI 38.98 kg/m2  SpO2 100% Physical Exam  Nursing note and vitals reviewed. Constitutional: She appears well-developed and well-nourished.  Obese female lying in exam bed, appears uncomfortable, holding her left side.  HENT:  Head: Normocephalic and atraumatic.  Eyes: Conjunctivae are normal. No scleral icterus.  Neck: Normal range of motion.  Cardiovascular: Normal rate, regular rhythm and normal heart sounds.   Regular rate and rhythm  Pulmonary/Chest: Effort normal and breath sounds normal. No respiratory distress. She has no wheezes. She has no rales. She  exhibits tenderness ( left chest wall under breast).  No respiratory distress, able to speak in full sentences w/o difficulty. Lungs: CTAB.  Left side chest wall tenderness under left breast  Abdominal: Soft. Bowel sounds are normal. She exhibits no distension and no mass. There is no tenderness. There is no rebound and no guarding.  Musculoskeletal: Normal range of motion.  Neurological: She is alert.  Skin: Skin is warm and dry.    ED Course  Procedures (including critical care time) Labs Review Labs Reviewed  CBC - Abnormal; Notable for the following:    WBC 10.6 (*)    All other components within normal limits  BASIC METABOLIC PANEL - Abnormal; Notable for the following:    GFR calc non Af Amer 72 (*)    GFR calc Af Amer 83 (*)    Anion gap 17 (*)    All other components within normal limits  I-STAT TROPOININ, ED    Imaging Review Dg Chest 2 View  02/06/2014   CLINICAL DATA:  Chest pain and shortness of Breath  EXAM: CHEST  2 VIEW  COMPARISON:  06/03/2011  FINDINGS: The heart size and mediastinal contours are within normal limits. Both lungs are clear. The visualized skeletal structures are unremarkable.  IMPRESSION: No active cardiopulmonary disease.   Electronically Signed   By: Lajean Manes M.D.   On: 02/06/2014 12:33     EKG Interpretation None      MDM   Final diagnoses:  Chest pain, unspecified chest pain type  Nausea    Pt is a 68yo female with c/o left sided chest pain that is reproducible.  Onset 1hr PTA associated with nausea and "tingling" in her tongue.  Pt has FH of CAD.  Personal hx of HTN and hypercholesteremia.  No hx of injury to left side or recent illness. Concern for ACS vs pneumonia vs musculoskeletal pain.  Will give ASA, morphine, and zofran for symptoms.  EKG: NSR, cannot exclude antior infarct, age undetermined, no significant change since previous tracing.   1PM: Pain has not improved much with medications, will give pt Nitro and repeat and  EKG.  Due to reports of worsening chest pain and intermittent hand numbness, will admit pt for CP r/o once BMP returns.    1:26 PM after nitro, pt states pain has improved, pt currently eating a sandwhich and appears more comfortable. Repeat EKG similar to previous EKG obtained upon pt arrival.  Will call Family Medicine to admit pt for CP r/o. PCP: [name]  1:59 PM Consulted with Dr. Doug Sou with family medicine. Will admit pt an obs tele bed for chest pain r/o, attending will be Dr. Lady Deutscher. Pt is stable and chest pain free at this time.    Noland Fordyce, PA-C 02/06/14 1401

## 2014-02-06 NOTE — ED Notes (Signed)
Patient transported to X-ray 

## 2014-02-07 ENCOUNTER — Observation Stay (HOSPITAL_COMMUNITY): Payer: Medicare Other

## 2014-02-07 DIAGNOSIS — E785 Hyperlipidemia, unspecified: Secondary | ICD-10-CM

## 2014-02-07 DIAGNOSIS — R079 Chest pain, unspecified: Secondary | ICD-10-CM

## 2014-02-07 DIAGNOSIS — I10 Essential (primary) hypertension: Secondary | ICD-10-CM

## 2014-02-07 MED ORDER — REGADENOSON 0.4 MG/5ML IV SOLN
0.4000 mg | Freq: Once | INTRAVENOUS | Status: AC
  Administered 2014-02-07: 0.4 mg via INTRAVENOUS
  Filled 2014-02-07: qty 5

## 2014-02-07 MED ORDER — REGADENOSON 0.4 MG/5ML IV SOLN
INTRAVENOUS | Status: AC
  Administered 2014-02-07: 0.4 mg via INTRAVENOUS
  Filled 2014-02-07: qty 5

## 2014-02-07 MED ORDER — TECHNETIUM TC 99M SESTAMIBI GENERIC - CARDIOLITE
10.0000 | Freq: Once | INTRAVENOUS | Status: AC | PRN
  Administered 2014-02-07: 10 via INTRAVENOUS

## 2014-02-07 MED ORDER — TECHNETIUM TC 99M SESTAMIBI GENERIC - CARDIOLITE
30.0000 | Freq: Once | INTRAVENOUS | Status: AC | PRN
  Administered 2014-02-07: 30 via INTRAVENOUS

## 2014-02-07 NOTE — Discharge Summary (Signed)
[facility] Discharge Summary  Patient name: Mary Le Medical record number: [id number] Date of birth: [birth year] Age: 68 y.o. Gender: female Date of Admission: 02/06/2014  Date of Discharge: 02/07/2014 Admitting Physician: Lind Covert, MD  Primary Care Provider: No primary provider on file. Consultants: Cardiology  Indication for Hospitalization:  Chest pain  Discharge Diagnoses/Problem List:  Chest pain HTN HLD Chronic back pain Chronic constipation  Disposition: Home  Discharge Condition: Stable  Discharge Exam: General: NAD, alert, well appearing, cooperative with exam  HEENT: [place]/AT, EOMI, supple neck  Cardiovascular: RRR, S1S2, CR brisk  Respiratory: CTAB, no wheezing or crackles  Abdomen: Soft, NTND, no HSM  Extremities: warm and well perfused, moves all freely  MSK: pain upon palpation of left flank, no chest pain with palpation.  Skin: no rashes  Neuro: no gross deficits, oriented.   Brief Hospital Course:  Mary Le is a 68 y.o. female presenting with chest pain. PMH is significant for HTN, Hypercholesterolemia, and back pain.  Pt admitted with atypical L chest pain associated with nausea and unrelated to exercise.  Pain improved with nitro and was not reproducible on palpation of chest wall.  Description of pain was more in L flank than in chest.  Troponins neg x3, EKG x2 with no ischemic changes, no events on telemetry, and normal Lexiscan myoview.  CP thought to be most likely related to gastritis/GERD as patient reported taking Mobic daily since April, 2015.  Mobic was held during admission.  Other chronic medical conditions, including HTN  and chronic constipationation, were managed with home regimens.  Issues for Follow Up:  1. Consider stopping Mobic as patient has been taking this daily for 3 months and it is possible that it is contributing to her chest pain with gastritis 2. Patient reports history of statin intolerance  and has not taken one in >4 yrs.  Recent lipid panel indicates statin therapy.  Consider once weekly statin dosing as patient may receive some benefit and tolerate it better than daily dosing.  Significant Procedures: None  Significant Labs and Imaging:   Recent Labs Lab 02/06/14 1122  WBC 10.6*  HGB 14.1  HCT 43.1  PLT 304    Recent Labs Lab 02/06/14 1122  NA 140  K 5.2  CL 102  CO2 21  GLUCOSE 85  BUN 17  CREATININE 0.83  CALCIUM 8.8    Lexiscan Myoview (7/20) : FINDINGS:  NSR with nonspecific ST-T changes at baseline. No additional changes  during Lexiscan infusion. Normal perfusion pattern on both rest and  stress images. Normal regional LV wall motion. LV EF > 75%.  IMPRESSION:  Normal pharmacological myocardial perfusion study.   Results/Tests Pending at Time of Discharge: None  Discharge Medications:    Medication List         acetaminophen 325 MG tablet  Commonly known as:  TYLENOL  Take 650 mg by mouth every 6 (six) hours as needed (pain).     ALAVERT 10 MG tablet  Generic drug:  loratadine  Take 10 mg by mouth daily as needed for allergies.     B COMPLEX-B12 PO  Take 1 tablet by mouth daily.     fish oil-omega-3 fatty acids 1000 MG capsule  Take 1 g by mouth daily.     lisinopril 20 MG tablet  Commonly known as:  PRINIVIL,ZESTRIL  Take 1 tablet (20 mg total) by mouth daily.     meloxicam 7.5 MG tablet  Commonly known as:  MOBIC  Take 1-2 tablets (7.5-15 mg total) by mouth daily.     metoprolol succinate 25 MG 24 hr tablet  Commonly known as:  TOPROL-XL  Take 1 tablet (25 mg total) by mouth daily.     multivitamin with minerals Tabs tablet  Take 1 tablet by mouth daily.     SYSTANE OP  Place 1 drop into both eyes daily as needed (dry eyeys).        Discharge Instructions: Please refer to Patient Instructions section of EMR for full details.  Patient was counseled important signs and symptoms that should prompt return to medical  care, changes in medications, dietary instructions, activity restrictions, and follow up appointments.   Follow-Up Appointments: Follow-up Information   Follow up with Sueanne Margarita, MD. Schedule an appointment as soon as possible for a visit in 2 weeks.   Specialty:  Cardiology   Contact information:   [address] [place] [address] [phone]       Lavon Paganini, MD 02/07/2014, 10:25 PM PGY-1, [place]

## 2014-02-07 NOTE — Progress Notes (Signed)
Family Medicine Teaching Service Daily Progress Note Intern Pager: [phone]  Patient name: Mary Le Medical record number: [id number] Date of birth: 31-Jul-1945 Age: 68 y.o. Gender: female  Primary Care Provider: No primary provider on file. Consultants: Cards Code Status: Full code  Pt Overview and Major Events to Date:  7/19 Admit for CP r/o 7/20 Myoview Lexiscan w/o inducible ischemia   Assessment and Plan:  Rheagan Le is a 68 y.o. female presenting with chest pain. PMH is significant for HTN, Hypercholesterolemia, and back pain.   #Chest pain: left side chest pain with nausea unrelated to exercise. Pain improved with nitro. No pain with palpation. Describes pain in left flank more than central chest. No tachycardia or pleuritic chest pain to suggest PE. No signs of infection to suggest PNA. Most likely related to gastritis/GERD as she has been taking Mobic daily since April.  - Continue to monitor on tele - AM Repeat EKG pending.  Troponins neg x3.  - TSH (nrml) and lipid panel (indicates statin but intolerant) recently performed in outpatient.  - nitro PRN and s/p GI cocktail  - Cards c/s, appreciate recs.  Lexiscan myoview today.  #HTN: controlled.  - Continue home Lisinopril and Metoprolol   #HLD: patient with history of not tolerating statins and hasn't taken one in over 4 years.  - Consider changing the frequency of dosing to weekly as patient may receive some benefit as outpt  #Chronic back pain: ongoing lower back pain. No saddle paresthesia or urinary incontinence. History of working in [place] that involved lifting many things.  - Holding home meloxicam, as could be contributing to CP through gastritis   #Constipation: reports that this has been going on all her life. She uses a generic laxative to help alleviate her symptoms.   FEN/GI: saline lock, NPO after MN for myoview  Prophylaxis: heparin SubQ  Disposition: Plan to d/c today if Myoview is normal  Subjective:   Pt feeling well, ready to go home  Objective: Temp:  [97.4 F (36.3 C)-97.7 F (36.5 C)] 97.4 F (36.3 C) (07/20 0500) Pulse Rate:  [61-70] 69 (07/20 0500) Resp:  [10-18] 16 (07/20 0500) BP: (113-150)/(44-100) 113/44 mmHg (07/20 0500) SpO2:  [99 %-100 %] 100 % (07/20 0500) Weight:  [219 lb 6.4 oz (99.519 kg)-221 lb 1.9 oz (100.3 kg)] 219 lb 6.4 oz (99.519 kg) (07/20 0500) Physical Exam: General: NAD, alert, well appearing, cooperative with exam  HEENT: [place]/AT, EOMI, supple neck  Cardiovascular: RRR, S1S2, CR brisk  Respiratory: CTAB, no wheezing or crackles  Abdomen: Soft, NTND, no HSM  Extremities: warm and well perfused, moves all freely  MSK: pain upon palpation of left flank, no chest pain with palpation.  Skin: no rashes  Neuro: no gross deficits, oriented.    Laboratory:  Recent Labs Lab 02/06/14 1122  WBC 10.6*  HGB 14.1  HCT 43.1  PLT 304    Recent Labs Lab 02/06/14 1122  NA 140  K 5.2  CL 102  CO2 21  BUN 17  CREATININE 0.83  CALCIUM 8.8  GLUCOSE 85    Imaging/Diagnostic Tests: EKG(7/19): NSR, no changes  Nolon Rod, DO 02/07/2014, 2:49 PM PGY-3, [place] Intern pager: [phone], text pages welcome

## 2014-02-07 NOTE — Discharge Instructions (Signed)
You were admitted for chest pain. This has resolved. All of the tests for your heart were normal. Please discuss if you should continue taking Mobic with your doctor. It is possible that this is causing some irritation of the lining of your stomach and causing some of your chest pain.   Chest Pain (Nonspecific) It is often hard to give a specific diagnosis for the cause of chest pain. There is always a chance that your pain could be related to something serious, such as a heart attack or a blood clot in the lungs. You need to follow up with your health care provider for further evaluation. CAUSES   Heartburn.  Pneumonia or bronchitis.  Anxiety or stress.  Inflammation around your heart (pericarditis) or lung (pleuritis or pleurisy).  A blood clot in the lung.  A collapsed lung (pneumothorax). It can develop suddenly on its own (spontaneous pneumothorax) or from trauma to the chest.  Shingles infection (herpes zoster virus). The chest wall is composed of bones, muscles, and cartilage. Any of these can be the source of the pain.  The bones can be bruised by injury.  The muscles or cartilage can be strained by coughing or overwork.  The cartilage can be affected by inflammation and become sore (costochondritis). DIAGNOSIS  Lab tests or other studies may be needed to find the cause of your pain. Your health care provider may have you take a test called an ambulatory electrocardiogram (ECG). An ECG records your heartbeat patterns over a 24-hour period. You may also have other tests, such as:  Transthoracic echocardiogram (TTE). During echocardiography, sound waves are used to evaluate how blood flows through your heart.  Transesophageal echocardiogram (TEE).  Cardiac monitoring. This allows your health care provider to monitor your heart rate and rhythm in real time.  Holter monitor. This is a portable device that records your heartbeat and can help diagnose heart arrhythmias. It  allows your health care provider to track your heart activity for several days, if needed.  Stress tests by exercise or by giving medicine that makes the heart beat faster. TREATMENT   Treatment depends on what may be causing your chest pain. Treatment may include:  Acid blockers for heartburn.  Anti-inflammatory medicine.  Pain medicine for inflammatory conditions.  Antibiotics if an infection is present.  You may be advised to change lifestyle habits. This includes stopping smoking and avoiding alcohol, caffeine, and chocolate.  You may be advised to keep your head raised (elevated) when sleeping. This reduces the chance of acid going backward from your stomach into your esophagus. Most of the time, nonspecific chest pain will improve within 2-3 days with rest and mild pain medicine.  HOME CARE INSTRUCTIONS   If antibiotics were prescribed, take them as directed. Finish them even if you start to feel better.  For the next few days, avoid physical activities that bring on chest pain. Continue physical activities as directed.  Do not use any tobacco products, including cigarettes, chewing tobacco, or electronic cigarettes.  Avoid drinking alcohol.  Only take medicine as directed by your health care provider.  Follow your health care provider's suggestions for further testing if your chest pain does not go away.  Keep any follow-up appointments you made. If you do not go to an appointment, you could develop lasting (chronic) problems with pain. If there is any problem keeping an appointment, call to reschedule. SEEK MEDICAL CARE IF:   Your chest pain does not go away, even after treatment.  You have a rash with blisters on your chest.  You have a fever. SEEK IMMEDIATE MEDICAL CARE IF:   You have increased chest pain or pain that spreads to your arm, neck, jaw, back, or abdomen.  You have shortness of breath.  You have an increasing cough, or you cough up blood.  You  have severe back or abdominal pain.  You feel nauseous or vomit.  You have severe weakness.  You faint.  You have chills. This is an emergency. Do not wait to see if the pain will go away. Get medical help at once. Call your local emergency services (911 in [place] Do not drive yourself to the hospital. MAKE SURE YOU:   Understand these instructions.  Will watch your condition.  Will get help right away if you are not doing well or get worse. Document Released: 04/17/2005 Document Revised: 07/13/2013 Document Reviewed: 02/11/2008 [facility] Patient Information 2015 [place], [place]. This information is not intended to replace advice given to you by your health care provider. Make sure you discuss any questions you have with your health care provider.

## 2014-02-07 NOTE — Consult Note (Signed)
Admit date: 02/06/2014 Referring Physician  Dr. Erin Hearing Primary Physician  None Primary Cardiologist  None Reason for Consultation  Chest pain  HPI: Mary Le is a 68 y.o. female presenting with chest pain. She started having chest pain this morning. It was associated with nausea and lightheadedness and a gripping on her left side. She's never had pain like this before. She felt numbness and tingling in her left arm as well. Rated the pain as a 10/10 this morning but has improved since coming to the ED and receiving Nitro. Pain is 3/10 currently. Pain was worse with lying down and better with sitting up. There was no exercise associated with her chest pain. She's had pain on deep inspiration but also describe a tight band across her chest associated with SOB. She's had lots of indestion after eating a [place] sandwich this morning. Her mother passed away at 21 yo due to an heart attack. She doesn't take ASA or a statin due to intolerance.    PMH:   Past Medical History  Diagnosis Date  . Hypertension   . Hypercholesteremia   . Back pain     arthritis     PSH:   Past Surgical History  Procedure Laterality Date  . Appendectomy    . Tonsillectomy    . Hemorroidectomy    . Tubal ligation      Allergies:  Aspirin and Statins Prior to Admit Meds:   Prescriptions prior to admission  Medication Sig Dispense Refill  . acetaminophen (TYLENOL) 325 MG tablet Take 650 mg by mouth every 6 (six) hours as needed (pain).       . B Complex Vitamins (B COMPLEX-B12 PO) Take 1 tablet by mouth daily.       . fish oil-omega-3 fatty acids 1000 MG capsule Take 1 g by mouth daily.       Marland Kitchen lisinopril (PRINIVIL,ZESTRIL) 20 MG tablet Take 1 tablet (20 mg total) by mouth daily.  90 tablet  0  . loratadine (ALAVERT) 10 MG tablet Take 10 mg by mouth daily as needed for allergies.      . meloxicam (MOBIC) 7.5 MG tablet Take 1-2 tablets (7.5-15 mg total) by mouth daily.  30 tablet  1  . metoprolol succinate  (TOPROL-XL) 25 MG 24 hr tablet Take 1 tablet (25 mg total) by mouth daily.  90 tablet  0  . Multiple Vitamin (MULTIVITAMIN WITH MINERALS) TABS tablet Take 1 tablet by mouth daily.      Vladimir Faster Glycol-Propyl Glycol (SYSTANE OP) Place 1 drop into both eyes daily as needed (dry eyeys).       Fam HX:    Family History  Problem Relation Age of Onset  . Heart disease Mother   . Kidney disease Father    Social HX:    History   Social History  . Marital Status: Married    Spouse Name: N/A    Number of Children: N/A  . Years of Education: N/A   Occupational History  . Not on file.   Social History Main Topics  . Smoking status: Former Smoker -- 5 years  . Smokeless tobacco: Never Used  . Alcohol Use: Yes     Comment: twice a week. Wine or liquor   . Drug Use: No  . Sexual Activity: Not on file   Other Topics Concern  . Not on file   Social History Narrative   Lives in [place]    Married. Education: [employer].  ROS:  All 11 ROS were addressed and are negative except what is stated in the HPI  Physical Exam: Blood pressure 113/44, pulse 69, temperature 97.4 F (36.3 C), temperature source Oral, resp. rate 16, height 5\' 3"  (1.6 m), weight 219 lb 6.4 oz (99.519 kg), SpO2 100.00%.    General: Well developed, well nourished, in no acute distress Head: Eyes PERRLA, No xanthomas.   Normal cephalic and atramatic Lungs:   Clear bilaterally to auscultation and percussion. Heart:   HRRR S1 S2 Pulses are 2+ & equal.            No carotid bruit. No JVD.  No abdominal bruits. No femoral bruits. Abdomen: Bowel sounds are positive, abdomen soft and non-tender without masses Extremities:   No clubbing, cyanosis or edema.  DP +1 Neuro: Alert and oriented X 3. Psych:  Good affect, responds appropriately    Labs:   Lab Results  Component Value Date   WBC 10.6* 02/06/2014   HGB 14.1 02/06/2014   HCT 43.1 02/06/2014   MCV 84.3 02/06/2014   PLT 304 02/06/2014    Recent Labs Lab  02/06/14 1122  NA 140  K 5.2  CL 102  CO2 21  BUN 17  CREATININE 0.83  CALCIUM 8.8  GLUCOSE 85   No results found for this basename: PTT   No results found for this basename: INR, PROTIME   Lab Results  Component Value Date   TROPONINI <0.30 02/06/2014     Lab Results  Component Value Date   CHOL 255* 01/03/2014   CHOL 233* 11/23/2012   CHOL 248* 12/07/2011   Lab Results  Component Value Date   HDL 48 01/03/2014   HDL 44 11/23/2012   HDL 47 12/07/2011   Lab Results  Component Value Date   LDLCALC 178* 01/03/2014   LDLCALC 165* 11/23/2012   LDLCALC 174* 12/07/2011   Lab Results  Component Value Date   TRIG 146 01/03/2014   TRIG 121 11/23/2012   TRIG 136 12/07/2011   Lab Results  Component Value Date   CHOLHDL 5.3 01/03/2014   CHOLHDL 5.3 11/23/2012   CHOLHDL 5.3 12/07/2011   No results found for this basename: LDLDIRECT      Radiology:  Dg Chest 2 View  02/06/2014   CLINICAL DATA:  Chest pain and shortness of Breath  EXAM: CHEST  2 VIEW  COMPARISON:  06/03/2011  FINDINGS: The heart size and mediastinal contours are within normal limits. Both lungs are clear. The visualized skeletal structures are unremarkable.  IMPRESSION: No active cardiopulmonary disease.   Electronically Signed   By: Lajean Manes M.D.   On: 02/06/2014 12:33    EKG:  NSR with no ST changes  Assessment and Plan:  [name] is a 68 y.o. female presenting with chest pain. PMH is significant for HTN, Hypercholesterolemia, and back pain.  1.  Chest pain:  left side chest pain with nausea unrelated to exercise. Pain improved with nitro. No pain with palpation. Describes pain in left flank more than central chest.  No tachycardia or pleuritic chest pain to suggest PE. No signs of infection to suggest PNA. Most likely related to reflux or ulcer as she has been taking Mobic everyday since April. Cardiac enzymes are normal x 3 and EKG is nonischemic.  She does have CRF including obesity, postmenopausal state, HTN,  family history at an early age and dyslipidemia. Her symptoms are somewhat atypical but she does describe chest tightness across her lower chest  with SOB.  Will keep NPO and get Lexiscan myoview today. 2.  HTN: controlled.  - Continue Lisinopril/ Metoprolol  3.  HLD: statin intolerant   Sueanne Margarita, MD  02/07/2014  7:55 AM

## 2014-02-07 NOTE — Progress Notes (Signed)
Utilization review completed.  

## 2014-02-07 NOTE — Progress Notes (Addendum)
Lexiscan myovue completed without complications.  Interpretation to follow.  Sharone Picchi, pac

## 2014-02-07 NOTE — H&P (Signed)
Family Medicine Teaching Service Attending Note  I interviewed and examined patient Mary Le and reviewed their tests and x-rays.  I discussed with Dr. Raeford Razor and reviewed their note for today.  I agree with their assessment and plan.

## 2014-02-08 NOTE — ED Provider Notes (Signed)
Medical screening examination/treatment/procedure(s) were conducted as a shared visit with non-physician practitioner(s) and myself.  I personally evaluated the patient during the encounter.   EKG Interpretation   Date/Time:  Sunday February 06 2014 13:18:50 EDT Ventricular Rate:  61 PR Interval:  154 QRS Duration: 86 QT Interval:  441 QTC Calculation: 444 R Axis:   64 Text Interpretation:  Age not entered, assumed to be  68 years old for  purpose of ECG interpretation Sinus rhythm ED PHYSICIAN INTERPRETATION  AVAILABLE IN [place] Confirmed by TEST, Record ([id number]) on  02/08/2014 7:32:14 AM       Patient with atypical chest pain, given risk factors will admit for tele obs for ACS r/o  Ephraim Hamburger, MD 02/08/14 1502

## 2014-02-09 NOTE — Discharge Summary (Signed)
Family Medicine Teaching Service  Discharge Note : Attending Jeff Mauriah Mcmillen MD Pager [phone] Inpatient Team Pager:  [phone]  I have reviewed this patient and the patient's chart and have discussed discharge planning with the resident at the time of discharge. I agree with the discharge plan as above.    

## 2014-04-12 ENCOUNTER — Ambulatory Visit (INDEPENDENT_AMBULATORY_CARE_PROVIDER_SITE_OTHER): Payer: Medicare Other

## 2014-04-12 ENCOUNTER — Ambulatory Visit (INDEPENDENT_AMBULATORY_CARE_PROVIDER_SITE_OTHER): Payer: Medicare Other | Admitting: Emergency Medicine

## 2014-04-12 VITALS — BP 142/80 | HR 75 | Temp 97.7°F | Resp 18 | Ht 63.0 in | Wt 219.0 lb

## 2014-04-12 DIAGNOSIS — R05 Cough: Secondary | ICD-10-CM

## 2014-04-12 DIAGNOSIS — Z23 Encounter for immunization: Secondary | ICD-10-CM

## 2014-04-12 DIAGNOSIS — K219 Gastro-esophageal reflux disease without esophagitis: Secondary | ICD-10-CM

## 2014-04-12 DIAGNOSIS — R059 Cough, unspecified: Secondary | ICD-10-CM

## 2014-04-12 MED ORDER — AZITHROMYCIN 250 MG PO TABS: ORAL_TABLET | ORAL | Status: DC

## 2014-04-12 MED ORDER — PROMETHAZINE-CODEINE 6.25-10 MG/5ML PO SYRP: 5.0000 mL | ORAL_SOLUTION | Freq: Four times a day (QID) | ORAL | Status: DC | PRN

## 2014-04-12 NOTE — Patient Instructions (Signed)
Gastroesophageal Reflux Disease, Adult Gastroesophageal reflux disease (GERD) happens when acid from your stomach flows up into the esophagus. When acid comes in contact with the esophagus, the acid causes soreness (inflammation) in the esophagus. Over time, GERD may create small holes (ulcers) in the lining of the esophagus. CAUSES   Increased body weight. This puts pressure on the stomach, making acid rise from the stomach into the esophagus.  Smoking. This increases acid production in the stomach.  Drinking alcohol. This causes decreased pressure in the lower esophageal sphincter (valve or ring of muscle between the esophagus and stomach), allowing acid from the stomach into the esophagus.  Late evening meals and a full stomach. This increases pressure and acid production in the stomach.  A malformed lower esophageal sphincter. Sometimes, no cause is found. SYMPTOMS   Burning pain in the lower part of the mid-chest behind the breastbone and in the mid-stomach area. This may occur twice a week or more often.  Trouble swallowing.  Sore throat.  Dry cough.  Asthma-like symptoms including chest tightness, shortness of breath, or wheezing. DIAGNOSIS  Your caregiver may be able to diagnose GERD based on your symptoms. In some cases, X-rays and other tests may be done to check for complications or to check the condition of your stomach and esophagus. TREATMENT  Your caregiver may recommend over-the-counter or prescription medicines to help decrease acid production. Ask your caregiver before starting or adding any new medicines.  HOME CARE INSTRUCTIONS   Change the factors that you can control. Ask your caregiver for guidance concerning weight loss, quitting smoking, and alcohol consumption.  Avoid foods and drinks that make your symptoms worse, such as:  Caffeine or alcoholic drinks.  Chocolate.  Peppermint or mint flavorings.  Garlic and onions.  Spicy foods.  Citrus fruits,  such as oranges, lemons, or limes.  Tomato-based foods such as sauce, chili, salsa, and pizza.  Fried and fatty foods.  Avoid lying down for the 3 hours prior to your bedtime or prior to taking a nap.  Eat small, frequent meals instead of large meals.  Wear loose-fitting clothing. Do not wear anything tight around your waist that causes pressure on your stomach.  Raise the head of your bed 6 to 8 inches with wood blocks to help you sleep. Extra pillows will not help.  Only take over-the-counter or prescription medicines for pain, discomfort, or fever as directed by your caregiver.  Do not take aspirin, ibuprofen, or other nonsteroidal anti-inflammatory drugs (NSAIDs). SEEK IMMEDIATE MEDICAL CARE IF:   You have pain in your arms, neck, jaw, teeth, or back.  Your pain increases or changes in intensity or duration.  You develop nausea, vomiting, or sweating (diaphoresis).  You develop shortness of breath, or you faint.  Your vomit is green, yellow, black, or looks like coffee grounds or blood.  Your stool is red, bloody, or black. These symptoms could be signs of other problems, such as heart disease, gastric bleeding, or esophageal bleeding. MAKE SURE YOU:   Understand these instructions.  Will watch your condition.  Will get help right away if you are not doing well or get worse. Document Released: 04/17/2005 Document Revised: 09/30/2011 Document Reviewed: 01/25/2011 [employer] Patient Information 2015 [employer]. This information is not intended to replace advice given to you by your health care provider. Make sure you discuss any questions you have with your health care provider.  

## 2014-04-12 NOTE — Progress Notes (Signed)
[facility] [address] [place] [address] [phone]  Date:  04/12/2014   Name:  Mary Le   DOB:  1946/04/07   MRN:  [id number]  PCP:  No primary provider on file.    Chief Complaint: Cough and Medication Refill   History of Present Illness:  Mary Le is a 68 y.o. very pleasant female patient who presents with the following:  Has a persistent cough for the past month.  She says initially it was productive of mucoid sputum. No wheezing or shortness of breath.  Says cough is worse at night. No fever or chills.   No nausea or vomiting. No increased GERD symptoms on OTC zantac Some allergic symptoms; congestion and watery drainage No improvement with over the counter medications or other home remedies. Denies other complaint or health concern today.   Patient Active Problem List   Diagnosis Date Noted  . GERD (gastroesophageal reflux disease) 04/12/2014  . Benign essential HTN 02/07/2014  . Dyslipidemia 02/07/2014  . Chest pain 02/06/2014    Past Medical History  Diagnosis Date  . Hypertension   . Hypercholesteremia   . Back pain     arthritis    Past Surgical History  Procedure Laterality Date  . Appendectomy    . Tonsillectomy    . Hemorroidectomy    . Tubal ligation      History  Substance Use Topics  . Smoking status: Former Smoker -- 5 years  . Smokeless tobacco: Never Used  . Alcohol Use: Yes     Comment: twice a week. Wine or liquor     Family History  Problem Relation Age of Onset  . Heart disease Mother   . Kidney disease Father     Allergies  Allergen Reactions  . Aspirin Nausea And Vomiting  . Statins Other (See Comments)    Myalgias and chest pain    Medication list has been reviewed and updated.  Current Outpatient Prescriptions on File Prior to Visit  Medication Sig Dispense Refill  . B Complex Vitamins (B COMPLEX-B12 PO) Take 1 tablet by mouth daily.       Marland Kitchen lisinopril (PRINIVIL,ZESTRIL) 20 MG tablet  Take 1 tablet (20 mg total) by mouth daily.  90 tablet  0  . loratadine (ALAVERT) 10 MG tablet Take 10 mg by mouth daily as needed for allergies.      . metoprolol succinate (TOPROL-XL) 25 MG 24 hr tablet Take 1 tablet (25 mg total) by mouth daily.  90 tablet  0  . Multiple Vitamin (MULTIVITAMIN WITH MINERALS) TABS tablet Take 1 tablet by mouth daily.      Vladimir Faster Glycol-Propyl Glycol (SYSTANE OP) Place 1 drop into both eyes daily as needed (dry eyeys).      Marland Kitchen acetaminophen (TYLENOL) 325 MG tablet Take 650 mg by mouth every 6 (six) hours as needed (pain).       . fish oil-omega-3 fatty acids 1000 MG capsule Take 1 g by mouth daily.       . meloxicam (MOBIC) 7.5 MG tablet Take 1-2 tablets (7.5-15 mg total) by mouth daily.  30 tablet  1   No current facility-administered medications on file prior to visit.    Review of Systems:  As per HPI, otherwise negative.    Physical Examination: Filed Vitals:   04/12/14 1415  BP: 142/80  Pulse: 75  Temp: 97.7 F (36.5 C)  Resp: 18   Filed Vitals:   04/12/14 1415  Height: 5\' 3"  (1.6 m)  Weight: 219 lb (99.338 kg)   Body mass index is 38.8 kg/(m^2). Ideal Body Weight: Weight in (lb) to have BMI = 25: 140.8  GEN: WDWN, NAD, Non-toxic, A & O x 3 HEENT: Atraumatic, Normocephalic. Neck supple. No masses, No LAD. Ears and Nose: No external deformity. CV: RRR, No M/G/R. No JVD. No thrill. No extra heart sounds. PULM: CTA B, no wheezes, crackles, rhonchi. No retractions. No resp. distress. No accessory muscle use. ABD: S, NT, ND, +BS. No rebound. No HSM. EXTR: No c/c/e NEURO Normal gait.  PSYCH: Normally interactive. Conversant. Not depressed or anxious appearing.  Calm demeanor. ]  Assessment and Plan: Cough ?lisinopril vs allergy vs GERD zpack Follow up   Signed,  Ellison Carwin, MD   [facility] reading (PRIMARY) by  Dr. Ouida Sills.  negative.

## 2014-04-13 ENCOUNTER — Other Ambulatory Visit: Payer: Self-pay | Admitting: Family Medicine

## 2014-04-22 ENCOUNTER — Telehealth: Payer: Self-pay

## 2014-04-22 DIAGNOSIS — I10 Essential (primary) hypertension: Secondary | ICD-10-CM

## 2014-04-22 MED ORDER — LISINOPRIL 20 MG PO TABS: 20.0000 mg | ORAL_TABLET | Freq: Every day | ORAL | Status: DC

## 2014-04-22 MED ORDER — METOPROLOL SUCCINATE ER 25 MG PO TB24: 25.0000 mg | ORAL_TABLET | Freq: Every day | ORAL | Status: DC

## 2014-04-22 NOTE — Telephone Encounter (Signed)
Sent in refill to [facility] notified.

## 2014-04-22 NOTE — Telephone Encounter (Signed)
Patient wants lisinopril 20mg  and metaprolol 25mg  sent to [facility] mail order pharmacy. Please return call and advise.  [phone]

## 2014-05-25 ENCOUNTER — Other Ambulatory Visit: Payer: Self-pay | Admitting: Sports Medicine

## 2014-05-25 DIAGNOSIS — M79604 Pain in right leg: Secondary | ICD-10-CM

## 2014-05-25 DIAGNOSIS — M545 Low back pain: Secondary | ICD-10-CM | POA: Diagnosis not present

## 2014-06-03 ENCOUNTER — Encounter (HOSPITAL_COMMUNITY): Payer: Self-pay | Admitting: Certified Registered"

## 2014-06-03 ENCOUNTER — Inpatient Hospital Stay (HOSPITAL_COMMUNITY): Payer: Medicare Other

## 2014-06-03 ENCOUNTER — Emergency Department (HOSPITAL_COMMUNITY): Payer: Medicare Other

## 2014-06-03 ENCOUNTER — Emergency Department (HOSPITAL_COMMUNITY): Payer: Medicare Other | Admitting: Certified Registered"

## 2014-06-03 ENCOUNTER — Encounter (HOSPITAL_COMMUNITY): Payer: Self-pay | Admitting: Radiology

## 2014-06-03 ENCOUNTER — Inpatient Hospital Stay (HOSPITAL_COMMUNITY)
Admission: EM | Admit: 2014-06-03 | Discharge: 2014-06-08 | DRG: 023 | Disposition: A | Discharge status: Rehabilitation Facility | Payer: Medicare Other | Attending: Neurology | Admitting: Neurology

## 2014-06-03 ENCOUNTER — Encounter (HOSPITAL_COMMUNITY)
Admission: EM | Disposition: A | Discharge status: Rehabilitation Facility | Payer: Self-pay | Source: Home / Self Care | Attending: Neurology

## 2014-06-03 ENCOUNTER — Ambulatory Visit
Admission: RE | Admit: 2014-06-03 | Discharge: 2014-06-03 | Disposition: A | Discharge status: Home / Self Care | Payer: Medicare Other | Source: Ambulatory Visit | Attending: Sports Medicine | Admitting: Sports Medicine

## 2014-06-03 DIAGNOSIS — R079 Chest pain, unspecified: Secondary | ICD-10-CM | POA: Diagnosis present

## 2014-06-03 DIAGNOSIS — N179 Acute kidney failure, unspecified: Secondary | ICD-10-CM | POA: Diagnosis not present

## 2014-06-03 DIAGNOSIS — I341 Nonrheumatic mitral (valve) prolapse: Secondary | ICD-10-CM | POA: Diagnosis not present

## 2014-06-03 DIAGNOSIS — J9600 Acute respiratory failure, unspecified whether with hypoxia or hypercapnia: Secondary | ICD-10-CM | POA: Diagnosis not present

## 2014-06-03 DIAGNOSIS — N189 Chronic kidney disease, unspecified: Secondary | ICD-10-CM | POA: Diagnosis present

## 2014-06-03 DIAGNOSIS — G8194 Hemiplegia, unspecified affecting left nondominant side: Secondary | ICD-10-CM | POA: Diagnosis present

## 2014-06-03 DIAGNOSIS — Z6841 Body Mass Index (BMI) 40.0 and over, adult: Secondary | ICD-10-CM | POA: Diagnosis not present

## 2014-06-03 DIAGNOSIS — E785 Hyperlipidemia, unspecified: Secondary | ICD-10-CM | POA: Diagnosis not present

## 2014-06-03 DIAGNOSIS — R29810 Facial weakness: Secondary | ICD-10-CM | POA: Diagnosis present

## 2014-06-03 DIAGNOSIS — I059 Rheumatic mitral valve disease, unspecified: Secondary | ICD-10-CM | POA: Diagnosis not present

## 2014-06-03 DIAGNOSIS — H518 Other specified disorders of binocular movement: Secondary | ICD-10-CM | POA: Diagnosis present

## 2014-06-03 DIAGNOSIS — I63419 Cerebral infarction due to embolism of unspecified middle cerebral artery: Secondary | ICD-10-CM | POA: Diagnosis not present

## 2014-06-03 DIAGNOSIS — I63511 Cerebral infarction due to unspecified occlusion or stenosis of right middle cerebral artery: Secondary | ICD-10-CM

## 2014-06-03 DIAGNOSIS — I129 Hypertensive chronic kidney disease with stage 1 through stage 4 chronic kidney disease, or unspecified chronic kidney disease: Secondary | ICD-10-CM | POA: Diagnosis not present

## 2014-06-03 DIAGNOSIS — R51 Headache: Secondary | ICD-10-CM | POA: Diagnosis not present

## 2014-06-03 DIAGNOSIS — M4806 Spinal stenosis, lumbar region: Secondary | ICD-10-CM | POA: Diagnosis not present

## 2014-06-03 DIAGNOSIS — I639 Cerebral infarction, unspecified: Secondary | ICD-10-CM

## 2014-06-03 DIAGNOSIS — E875 Hyperkalemia: Secondary | ICD-10-CM | POA: Diagnosis present

## 2014-06-03 DIAGNOSIS — Z8249 Family history of ischemic heart disease and other diseases of the circulatory system: Secondary | ICD-10-CM

## 2014-06-03 DIAGNOSIS — Z23 Encounter for immunization: Secondary | ICD-10-CM | POA: Diagnosis not present

## 2014-06-03 DIAGNOSIS — E780 Pure hypercholesterolemia: Secondary | ICD-10-CM | POA: Diagnosis present

## 2014-06-03 DIAGNOSIS — E119 Type 2 diabetes mellitus without complications: Secondary | ICD-10-CM | POA: Diagnosis present

## 2014-06-03 DIAGNOSIS — I2510 Atherosclerotic heart disease of native coronary artery without angina pectoris: Secondary | ICD-10-CM | POA: Diagnosis present

## 2014-06-03 DIAGNOSIS — Z888 Allergy status to other drugs, medicaments and biological substances status: Secondary | ICD-10-CM | POA: Diagnosis not present

## 2014-06-03 DIAGNOSIS — R471 Dysarthria and anarthria: Secondary | ICD-10-CM | POA: Diagnosis present

## 2014-06-03 DIAGNOSIS — D45 Polycythemia vera: Secondary | ICD-10-CM | POA: Diagnosis present

## 2014-06-03 DIAGNOSIS — R1310 Dysphagia, unspecified: Secondary | ICD-10-CM | POA: Diagnosis not present

## 2014-06-03 DIAGNOSIS — R4781 Slurred speech: Secondary | ICD-10-CM | POA: Diagnosis not present

## 2014-06-03 DIAGNOSIS — M79604 Pain in right leg: Secondary | ICD-10-CM

## 2014-06-03 DIAGNOSIS — J9601 Acute respiratory failure with hypoxia: Secondary | ICD-10-CM | POA: Diagnosis present

## 2014-06-03 DIAGNOSIS — G9340 Encephalopathy, unspecified: Secondary | ICD-10-CM | POA: Diagnosis not present

## 2014-06-03 DIAGNOSIS — R109 Unspecified abdominal pain: Secondary | ICD-10-CM | POA: Diagnosis not present

## 2014-06-03 DIAGNOSIS — J984 Other disorders of lung: Secondary | ICD-10-CM | POA: Diagnosis not present

## 2014-06-03 DIAGNOSIS — E871 Hypo-osmolality and hyponatremia: Secondary | ICD-10-CM | POA: Diagnosis present

## 2014-06-03 DIAGNOSIS — M549 Dorsalgia, unspecified: Secondary | ICD-10-CM | POA: Diagnosis not present

## 2014-06-03 DIAGNOSIS — R531 Weakness: Secondary | ICD-10-CM | POA: Diagnosis not present

## 2014-06-03 DIAGNOSIS — I63411 Cerebral infarction due to embolism of right middle cerebral artery: Secondary | ICD-10-CM | POA: Diagnosis not present

## 2014-06-03 DIAGNOSIS — Z87891 Personal history of nicotine dependence: Secondary | ICD-10-CM

## 2014-06-03 DIAGNOSIS — I6789 Other cerebrovascular disease: Secondary | ICD-10-CM | POA: Diagnosis not present

## 2014-06-03 DIAGNOSIS — I6601 Occlusion and stenosis of right middle cerebral artery: Secondary | ICD-10-CM | POA: Diagnosis not present

## 2014-06-03 DIAGNOSIS — R414 Neurologic neglect syndrome: Secondary | ICD-10-CM | POA: Diagnosis present

## 2014-06-03 DIAGNOSIS — I669 Occlusion and stenosis of unspecified cerebral artery: Secondary | ICD-10-CM | POA: Diagnosis not present

## 2014-06-03 DIAGNOSIS — M5126 Other intervertebral disc displacement, lumbar region: Secondary | ICD-10-CM | POA: Diagnosis not present

## 2014-06-03 DIAGNOSIS — Z4659 Encounter for fitting and adjustment of other gastrointestinal appliance and device: Secondary | ICD-10-CM

## 2014-06-03 DIAGNOSIS — G8190 Hemiplegia, unspecified affecting unspecified side: Secondary | ICD-10-CM | POA: Diagnosis not present

## 2014-06-03 DIAGNOSIS — Z4682 Encounter for fitting and adjustment of non-vascular catheter: Secondary | ICD-10-CM | POA: Diagnosis not present

## 2014-06-03 DIAGNOSIS — I10 Essential (primary) hypertension: Secondary | ICD-10-CM | POA: Diagnosis not present

## 2014-06-03 DIAGNOSIS — I959 Hypotension, unspecified: Secondary | ICD-10-CM | POA: Diagnosis not present

## 2014-06-03 DIAGNOSIS — I63311 Cerebral infarction due to thrombosis of right middle cerebral artery: Secondary | ICD-10-CM | POA: Diagnosis not present

## 2014-06-03 DIAGNOSIS — R4182 Altered mental status, unspecified: Secondary | ICD-10-CM | POA: Diagnosis present

## 2014-06-03 DIAGNOSIS — M47817 Spondylosis without myelopathy or radiculopathy, lumbosacral region: Secondary | ICD-10-CM | POA: Diagnosis not present

## 2014-06-03 DIAGNOSIS — Z886 Allergy status to analgesic agent status: Secondary | ICD-10-CM

## 2014-06-03 HISTORY — DX: Cerebral infarction, unspecified: I63.9

## 2014-06-03 HISTORY — PX: RADIOLOGY WITH ANESTHESIA: SHX6223

## 2014-06-03 LAB — CBC
HCT: 48.2 % — ABNORMAL HIGH (ref 36.0–46.0)
Hemoglobin: 16.1 g/dL — ABNORMAL HIGH (ref 12.0–15.0)
MCH: 28 pg (ref 26.0–34.0)
MCHC: 33.4 g/dL (ref 30.0–36.0)
MCV: 83.7 fL (ref 78.0–100.0)
PLATELETS: 219 10*3/uL (ref 150–400)
RBC: 5.76 MIL/uL — ABNORMAL HIGH (ref 3.87–5.11)
RDW: 14.9 % (ref 11.5–15.5)
WBC: 18.1 10*3/uL — ABNORMAL HIGH (ref 4.0–10.5)

## 2014-06-03 LAB — PROTIME-INR
INR: 1.04 (ref 0.00–1.49)
PROTHROMBIN TIME: 13.7 s (ref 11.6–15.2)

## 2014-06-03 LAB — COMPREHENSIVE METABOLIC PANEL
ALT: 12 U/L (ref 0–35)
AST: 16 U/L (ref 0–37)
Albumin: 3.4 g/dL — ABNORMAL LOW (ref 3.5–5.2)
Alkaline Phosphatase: 54 U/L (ref 39–117)
Anion gap: 16 — ABNORMAL HIGH (ref 5–15)
BUN: 26 mg/dL — ABNORMAL HIGH (ref 6–23)
CALCIUM: 9.5 mg/dL (ref 8.4–10.5)
CO2: 19 meq/L (ref 19–32)
Chloride: 101 mEq/L (ref 96–112)
Creatinine, Ser: 1.16 mg/dL — ABNORMAL HIGH (ref 0.50–1.10)
GFR calc Af Amer: 56 mL/min — ABNORMAL LOW (ref >90)
GFR calc non Af Amer: 48 mL/min — ABNORMAL LOW (ref >90)
Glucose, Bld: 136 mg/dL — ABNORMAL HIGH (ref 70–99)
Potassium: 5.6 mEq/L — ABNORMAL HIGH (ref 3.7–5.3)
Sodium: 136 mEq/L — ABNORMAL LOW (ref 137–147)
TOTAL PROTEIN: 7.3 g/dL (ref 6.0–8.3)
Total Bilirubin: 0.3 mg/dL (ref 0.3–1.2)

## 2014-06-03 LAB — RAPID URINE DRUG SCREEN, HOSP PERFORMED
AMPHETAMINES: NOT DETECTED
Barbiturates: NOT DETECTED
Benzodiazepines: NOT DETECTED
Cocaine: NOT DETECTED
Opiates: NOT DETECTED
Tetrahydrocannabinol: POSITIVE — AB

## 2014-06-03 LAB — I-STAT CHEM 8, ED
BUN: 42 mg/dL — ABNORMAL HIGH (ref 6–23)
CALCIUM ION: 1.08 mmol/L — AB (ref 1.13–1.30)
CHLORIDE: 109 meq/L (ref 96–112)
Creatinine, Ser: 1.1 mg/dL (ref 0.50–1.10)
Glucose, Bld: 120 mg/dL — ABNORMAL HIGH (ref 70–99)
HCT: 53 % — ABNORMAL HIGH (ref 36.0–46.0)
Hemoglobin: 18 g/dL — ABNORMAL HIGH (ref 12.0–15.0)
Potassium: 7.8 mEq/L (ref 3.7–5.3)
Sodium: 134 mEq/L — ABNORMAL LOW (ref 137–147)
TCO2: 22 mmol/L (ref 0–100)

## 2014-06-03 LAB — URINALYSIS, ROUTINE W REFLEX MICROSCOPIC
GLUCOSE, UA: NEGATIVE mg/dL
Hgb urine dipstick: NEGATIVE
Ketones, ur: NEGATIVE mg/dL
LEUKOCYTES UA: NEGATIVE
Nitrite: NEGATIVE
PROTEIN: 30 mg/dL — AB
Specific Gravity, Urine: 1.035 — ABNORMAL HIGH (ref 1.005–1.030)
UROBILINOGEN UA: 0.2 mg/dL (ref 0.0–1.0)
pH: 5.5 (ref 5.0–8.0)

## 2014-06-03 LAB — URINE MICROSCOPIC-ADD ON

## 2014-06-03 LAB — DIFFERENTIAL
BASOS PCT: 0 % (ref 0–1)
Basophils Absolute: 0 10*3/uL (ref 0.0–0.1)
Eosinophils Absolute: 0 10*3/uL (ref 0.0–0.7)
Eosinophils Relative: 0 % (ref 0–5)
LYMPHS PCT: 13 % (ref 12–46)
Lymphs Abs: 2.3 10*3/uL (ref 0.7–4.0)
Monocytes Absolute: 0.9 10*3/uL (ref 0.1–1.0)
Monocytes Relative: 5 % (ref 3–12)
Neutro Abs: 14.9 10*3/uL — ABNORMAL HIGH (ref 1.7–7.7)
Neutrophils Relative %: 82 % — ABNORMAL HIGH (ref 43–77)

## 2014-06-03 LAB — ETHANOL: Alcohol, Ethyl (B): <11 mg/dL (ref 0–11)

## 2014-06-03 LAB — APTT: aPTT: 26 seconds (ref 24–37)

## 2014-06-03 LAB — I-STAT TROPONIN, ED: Troponin i, poc: 0 ng/mL (ref 0.00–0.08)

## 2014-06-03 LAB — MRSA PCR SCREENING: MRSA BY PCR: NEGATIVE

## 2014-06-03 LAB — CBG MONITORING, ED: Glucose-Capillary: 103 mg/dL — ABNORMAL HIGH (ref 70–99)

## 2014-06-03 SURGERY — RADIOLOGY WITH ANESTHESIA
Anesthesia: General

## 2014-06-03 MED ORDER — ACETAMINOPHEN 650 MG RE SUPP
650.0000 mg | Freq: Four times a day (QID) | RECTAL | Status: DC | PRN
  Filled 2014-06-03: qty 1

## 2014-06-03 MED ORDER — PROPOFOL 10 MG/ML IV BOLUS
INTRAVENOUS | Status: DC | PRN
  Administered 2014-06-03 (×2): 50 mg via INTRAVENOUS
  Administered 2014-06-03: 40 mg via INTRAVENOUS
  Administered 2014-06-03: 50 mg via INTRAVENOUS

## 2014-06-03 MED ORDER — SODIUM CHLORIDE 0.9 % IV SOLN: 250.0000 mL | INTRAVENOUS | Status: DC | PRN

## 2014-06-03 MED ORDER — ACETAMINOPHEN 500 MG PO TABS
1000.0000 mg | ORAL_TABLET | Freq: Four times a day (QID) | ORAL | Status: DC | PRN
  Administered 2014-06-07: 1000 mg via ORAL
  Filled 2014-06-03: qty 2

## 2014-06-03 MED ORDER — SODIUM CHLORIDE 0.9 % IV SOLN
INTRAVENOUS | Status: DC | PRN
  Administered 2014-06-03 (×2): via INTRAVENOUS

## 2014-06-03 MED ORDER — FENTANYL CITRATE 0.05 MG/ML IJ SOLN
INTRAMUSCULAR | Status: AC
  Filled 2014-06-03: qty 2

## 2014-06-03 MED ORDER — EPTIFIBATIDE 2 MG/ML IV SOLN
INTRAVENOUS | Status: AC
  Filled 2014-06-03: qty 10

## 2014-06-03 MED ORDER — SODIUM CHLORIDE 0.9 % IV BOLUS (SEPSIS)
1000.0000 mL | Freq: Once | INTRAVENOUS | Status: AC
  Administered 2014-06-03: 1000 mL via INTRAVENOUS

## 2014-06-03 MED ORDER — SUCCINYLCHOLINE CHLORIDE 20 MG/ML IJ SOLN
INTRAMUSCULAR | Status: DC | PRN
  Administered 2014-06-03: 60 mg via INTRAVENOUS

## 2014-06-03 MED ORDER — HEPARIN SODIUM (PORCINE) 5000 UNIT/ML IJ SOLN
5000.0000 [IU] | Freq: Three times a day (TID) | INTRAMUSCULAR | Status: DC
  Administered 2014-06-04 – 2014-06-08 (×12): 5000 [IU] via SUBCUTANEOUS
  Filled 2014-06-03 (×15): qty 1

## 2014-06-03 MED ORDER — HEPARIN SODIUM (PORCINE) 1000 UNIT/ML IJ SOLN
INTRAMUSCULAR | Status: AC | PRN
  Administered 2014-06-03: 1000 [IU] via INTRA_ARTERIAL

## 2014-06-03 MED ORDER — SUFENTANIL CITRATE 50 MCG/ML IV SOLN
INTRAVENOUS | Status: DC | PRN
  Administered 2014-06-03 (×3): 10 ug via INTRAVENOUS
  Administered 2014-06-03: 20 ug via INTRAVENOUS

## 2014-06-03 MED ORDER — CEFAZOLIN SODIUM-DEXTROSE 2-3 GM-% IV SOLR
INTRAVENOUS | Status: AC
  Administered 2014-06-03: 2 g via INTRAVENOUS
  Filled 2014-06-03: qty 50

## 2014-06-03 MED ORDER — EPTIFIBATIDE 2 MG/ML IV SOLN
2.0000 mg | Freq: Once | INTRAVENOUS | Status: AC
  Administered 2014-06-03: 4 mg
  Administered 2014-06-03: 1 mg
  Administered 2014-06-03: 4 mg

## 2014-06-03 MED ORDER — IOHEXOL 350 MG/ML SOLN
50.0000 mL | Freq: Once | INTRAVENOUS | Status: AC | PRN
  Administered 2014-06-03: 50 mL via INTRAVENOUS

## 2014-06-03 MED ORDER — SENNOSIDES-DOCUSATE SODIUM 8.6-50 MG PO TABS
1.0000 | ORAL_TABLET | Freq: Every evening | ORAL | Status: DC | PRN
  Filled 2014-06-03: qty 1

## 2014-06-03 MED ORDER — ROCURONIUM BROMIDE 100 MG/10ML IV SOLN
INTRAVENOUS | Status: DC | PRN
  Administered 2014-06-03 (×2): 50 mg via INTRAVENOUS

## 2014-06-03 MED ORDER — NICARDIPINE HCL IN NACL 20-0.86 MG/200ML-% IV SOLN
5.0000 mg/h | INTRAVENOUS | Status: DC
  Administered 2014-06-03: 5 mg/h via INTRAVENOUS
  Filled 2014-06-03: qty 200

## 2014-06-03 MED ORDER — FENTANYL CITRATE 0.05 MG/ML IJ SOLN
50.0000 ug | INTRAMUSCULAR | Status: DC | PRN
  Administered 2014-06-03 – 2014-06-04 (×2): 50 ug via INTRAVENOUS
  Filled 2014-06-03: qty 2

## 2014-06-03 MED ORDER — PROPOFOL 10 MG/ML IV EMUL
0.0000 ug/kg/min | INTRAVENOUS | Status: DC
  Administered 2014-06-03: 20 ug/kg/min via INTRAVENOUS
  Administered 2014-06-04: 30 ug/kg/min via INTRAVENOUS
  Administered 2014-06-04: 50 ug/kg/min via INTRAVENOUS
  Administered 2014-06-04: 40.81 ug/kg/min via INTRAVENOUS
  Administered 2014-06-05: 20 ug/kg/min via INTRAVENOUS
  Filled 2014-06-03 (×6): qty 100

## 2014-06-03 MED ORDER — LABETALOL HCL 5 MG/ML IV SOLN
INTRAVENOUS | Status: DC | PRN
  Administered 2014-06-03 (×2): 10 mg via INTRAVENOUS

## 2014-06-03 MED ORDER — HEPARIN SODIUM (PORCINE) 1000 UNIT/ML IJ SOLN
INTRAMUSCULAR | Status: DC | PRN
  Administered 2014-06-03: 2000 [IU] via INTRAVENOUS

## 2014-06-03 MED ORDER — STROKE: EARLY STAGES OF RECOVERY BOOK
Freq: Once | Status: AC
Start: 2014-06-03 — End: 2014-06-03
  Administered 2014-06-03: 22:00:00
  Filled 2014-06-03: qty 1

## 2014-06-03 MED ORDER — LIDOCAINE HCL 1 % IJ SOLN
INTRAMUSCULAR | Status: AC
  Filled 2014-06-03: qty 20

## 2014-06-03 MED ORDER — PROPOFOL 10 MG/ML IV EMUL
INTRAVENOUS | Status: AC
  Filled 2014-06-03: qty 100

## 2014-06-03 MED ORDER — SODIUM CHLORIDE 0.9 % IJ SOLN
1.5000 mg | INTRAVENOUS | Status: AC
  Administered 2014-06-03 (×4): 25 ug via INTRA_ARTERIAL

## 2014-06-03 MED ORDER — INSULIN ASPART 100 UNIT/ML ~~LOC~~ SOLN: 0.0000 [IU] | Freq: Three times a day (TID) | SUBCUTANEOUS | Status: DC

## 2014-06-03 MED ORDER — SODIUM CHLORIDE 0.9 % IV SOLN: INTRAVENOUS | Status: DC

## 2014-06-03 MED ORDER — PHENYLEPHRINE HCL 10 MG/ML IJ SOLN
INTRAMUSCULAR | Status: DC | PRN
  Administered 2014-06-03 (×3): 80 ug via INTRAVENOUS

## 2014-06-03 MED ORDER — ONDANSETRON HCL 4 MG/2ML IJ SOLN: 4.0000 mg | Freq: Four times a day (QID) | INTRAMUSCULAR | Status: DC | PRN

## 2014-06-03 MED ORDER — PANTOPRAZOLE SODIUM 40 MG IV SOLR
40.0000 mg | Freq: Every day | INTRAVENOUS | Status: DC
  Administered 2014-06-04: 40 mg via INTRAVENOUS
  Filled 2014-06-03 (×2): qty 40

## 2014-06-03 MED ORDER — SODIUM CHLORIDE 0.9 % IV SOLN
INTRAVENOUS | Status: DC
  Administered 2014-06-03 – 2014-06-06 (×5): via INTRAVENOUS
  Administered 2014-06-06: 100 mL/h via INTRAVENOUS

## 2014-06-03 MED ORDER — PHENYLEPHRINE HCL 10 MG/ML IJ SOLN
10.0000 mg | INTRAVENOUS | Status: DC | PRN
  Administered 2014-06-03: 25 ug/min via INTRAVENOUS

## 2014-06-03 MED ORDER — IOHEXOL 300 MG/ML  SOLN
150.0000 mL | Freq: Once | INTRAMUSCULAR | Status: AC | PRN
  Administered 2014-06-03: 1 mL via INTRAVENOUS

## 2014-06-03 NOTE — Anesthesia Procedure Notes (Signed)
Procedure Name: Intubation Date/Time: 06/03/2014 7:08 PM Performed by: Claris Che Pre-anesthesia Checklist: Patient identified, Emergency Drugs available, Suction available, Patient being monitored and Timeout performed Patient Re-evaluated:Patient Re-evaluated prior to inductionOxygen Delivery Method: Circle system utilized Preoxygenation: Pre-oxygenation with 100% oxygen Intubation Type: IV induction, Rapid sequence and Cricoid Pressure applied Ventilation: Mask ventilation without difficulty Laryngoscope Size: Mac and 3 Grade View: Grade I Tube type: Subglottic suction tube Tube size: 7.5 mm Number of attempts: 1 Airway Equipment and Method: Stylet Placement Confirmation: ETT inserted through vocal cords under direct vision,  positive ETCO2 and breath sounds checked- equal and bilateral Secured at: 21 cm Tube secured with: Tape Dental Injury: Teeth and Oropharynx as per pre-operative assessment

## 2014-06-03 NOTE — ED Notes (Signed)
IR ready, patient immediately transferred to IR on cardiac monitor with primary nurses Jeannine Kitten, Sandy Salaam the stroke nurse, and family.

## 2014-06-03 NOTE — Progress Notes (Signed)
[facility] Progress Note Patient Name: Mary Le DOB: 05/20/1946 MRN: [id number]   Date of Service  06/03/2014  HPI/Events of Note  Rt MCA-CVA s/p clot retrieval Intubated, hypertensive  eICU Interventions  Vent orders PAD 3 - propofol/ int fent   New ICU patient evaluation: This patient was evaluated by the [facility] team. I have reviewed relevant documentation including care plan & orders.   Intervention Category Evaluation Type: New Patient Evaluation  Mary Le V. 06/03/2014, 10:52 PM

## 2014-06-03 NOTE — Consult Note (Signed)
PULMONARY  / [place]   Name: Mary Le MRN: [id number] DOB: [birth year]    ADMISSION DATE:  06/03/2014 CONSULTATION DATE: June 03, 2014  REQUESTING CLINICIAN: Alexis Goodell, MD  PRIMARY SERVICE: Neurology  CHIEF COMPLAINT:  Left sided weakness  BRIEF PATIENT DESCRIPTION: 68 y/o woman w/ hx of HTN, HLD who presents w/ L hemiparasis and R MCA stroke, last known well at 13:30 on 11/13.  SIGNIFICANT EVENTS / STUDIES:  M MCA clot retrieval, 06/03/14  LINES / TUBES: 7.39mm ETT placed 11/13 L rad A-line placed 11/13 PIV x2 placed 11/13 Foley placed 11/13  CULTURES: None  ANTIBIOTICS: Ancef x1 dose, 11/13  HISTORY OF PRESENT ILLNESS:  [name] is a 68 y/o woman who was feeling well today, and went down for a nap at 13:30, but when family went to waken her this evening, she was noted to have a left facial droop and L-sided weakness. EMS was called due to concerns for stroke, and she was taken interventional neuroradiology for clot retrieval as she was outside of the window for tPA. The patient was intubated for the procedure and was maintained on propofol and was given rocuronium x2. She was brought to the Neuro ICU and was maintained on mechanical ventilation. PCCM was consulted for medical and ventilator management.  PAST MEDICAL HISTORY :  Past Medical History  Diagnosis Date  . Hypertension   . Hypercholesteremia   . Back pain     arthritis   Past Surgical History  Procedure Laterality Date  . Appendectomy    . Tonsillectomy    . Hemorroidectomy    . Tubal ligation     Prior to Admission medications   Medication Sig Start Date End Date Taking? Authorizing Provider  acetaminophen (TYLENOL) 325 MG tablet Take 650 mg by mouth every 6 (six) hours as needed (pain).     Historical Provider, MD  azithromycin (ZITHROMAX) 250 MG tablet Take 2 tabs PO x 1 dose, then 1 tab PO QD x 4 days 04/12/14   Roselee Culver, MD  B Complex Vitamins (B  COMPLEX-B12 PO) Take 1 tablet by mouth daily.     Historical Provider, MD  fish oil-omega-3 fatty acids 1000 MG capsule Take 1 g by mouth daily.     Historical Provider, MD  lisinopril (PRINIVIL,ZESTRIL) 20 MG tablet Take 1 tablet (20 mg total) by mouth daily. 04/22/14   Chelle S Jeffery, PA-C  loratadine (ALAVERT) 10 MG tablet Take 10 mg by mouth daily as needed for allergies.    Historical Provider, MD  meloxicam (MOBIC) 7.5 MG tablet Take 1-2 tablets (7.5-15 mg total) by mouth daily. 01/03/14   [name], MD  metoprolol succinate (TOPROL-XL) 25 MG 24 hr tablet Take 1 tablet (25 mg total) by mouth daily. 04/22/14   Chelle Janalee Dane, PA-C  Multiple Vitamin (MULTIVITAMIN WITH MINERALS) TABS tablet Take 1 tablet by mouth daily.    Historical Provider, MD  Polyethyl Glycol-Propyl Glycol (SYSTANE OP) Place 1 drop into both eyes daily as needed (dry eyeys).    Historical Provider, MD  promethazine-codeine (PHENERGAN WITH CODEINE) 6.25-10 MG/5ML syrup Take 5-10 mLs by mouth every 6 (six) hours as needed. 04/12/14   Roselee Culver, MD   Allergies  Allergen Reactions  . Aspirin Nausea And Vomiting  . Statins Other (See Comments)    Myalgias and chest pain    FAMILY HISTORY:  Family History  Problem Relation Age of Onset  . Heart disease Mother   .  Kidney disease Father    SOCIAL HISTORY:  reports that she has quit smoking. She has never used smokeless tobacco. She reports that she drinks alcohol. She reports that she does not use illicit drugs.  REVIEW OF SYSTEMS:  Cannot obtain 2/2 sedation and intubation.  SUBJECTIVE:   VITAL SIGNS: Temp:  [95 F (35 C)-97.7 F (36.5 C)] 95.5 F (35.3 C) (11/13 2245) Pulse Rate:  [64-77] 75 (11/13 2245) Resp:  [14-24] 14 (11/13 2245) BP: (105-146)/(55-93) 131/59 mmHg (11/13 2245) SpO2:  [95 %-100 %] 100 % (11/13 2245) Arterial Line BP: (154-196)/(64-80) 173/70 mmHg (11/13 2245) FiO2 (%):  [100 %] 100 % (11/13 2140) Weight:  [215 lb (97.523  kg)-225 lb 1.4 oz (102.099 kg)] 221 lb 1.9 oz (100.3 kg) (11/13 2300) HEMODYNAMICS:   VENTILATOR SETTINGS: Vent Mode:  [-] PRVC FiO2 (%):  [100 %] 100 % Set Rate:  [16 bmp] 16 bmp Vt Set:  [450 mL] 450 mL PEEP:  [5 cmH20] 5 cmH20 Plateau Pressure:  [17 cmH20] 17 cmH20 INTAKE / OUTPUT: Intake/Output      11/13 0701 - 11/14 0700   I.V. (mL/kg) 1300 (13)   Total Intake(mL/kg) 1300 (13)   Urine (mL/kg/hr) 350   Total Output 350   Net +950         PHYSICAL EXAMINATION: General:  Middle-aged AAF in NAD lying intubated in bed. Neuro:  Sedated, responds purposefully to noxious stimulous HEENT:  MMM Neck: Trachea is midline, no masses Cardiovascular:  RRR Lungs:  Bronchial breath sounds. Abdomen:  Obese, non-distended Musculoskeletal:  No deformities. Skin: No rashes, intact.  LABS:  CBC  Recent Labs Lab 06/03/14 1715 06/03/14 1727  WBC 18.1*  --   HGB 16.1* 18.0*  HCT 48.2* 53.0*  PLT 219  --    Coag's  Recent Labs Lab 06/03/14 1735  APTT 26  INR 1.04   BMET  Recent Labs Lab 06/03/14 1727 06/03/14 1735  NA 134* 136*  K 7.8* 5.6*  CL 109 101  CO2  --  19  BUN 42* 26*  CREATININE 1.10 1.16*  GLUCOSE 120* 136*   Electrolytes  Recent Labs Lab 06/03/14 1735  CALCIUM 9.5   Sepsis Markers No results for input(s): LATICACIDVEN, PROCALCITON, O2SATVEN in the last 168 hours. ABG No results for input(s): PHART, PCO2ART, PO2ART in the last 168 hours. Liver Enzymes  Recent Labs Lab 06/03/14 1735  AST 16  ALT 12  ALKPHOS 54  BILITOT 0.3  ALBUMIN 3.4*   Cardiac Enzymes No results for input(s): TROPONINI, PROBNP in the last 168 hours. Glucose  Recent Labs Lab 06/03/14 1808  GLUCAP 103*    Imaging Ct Angio Head W/cm &/or Wo Cm  06/03/2014   CLINICAL DATA:  Code stroke. Left-sided weakness and slurred speech.  EXAM: CT ANGIOGRAPHY HEAD AND NECK  TECHNIQUE: Multidetector CT imaging of the head and neck was performed using the standard protocol  during bolus administration of intravenous contrast. Multiplanar CT image reconstructions and MIPs were obtained to evaluate the vascular anatomy. Carotid stenosis measurements (when applicable) are obtained utilizing NASCET criteria, using the distal internal carotid diameter as the denominator.  CONTRAST:  32mL OMNIPAQUE IOHEXOL 350 MG/ML SOLN  COMPARISON:  Head CT 06/03/2014  FINDINGS: CTA HEAD FINDINGS  On postcontrast head CT images, there is question of developing hypoattenuation in the right temporal lobe, right insula, and possibly right parietal lobe. Ventricles and sulci are within normal limits. No acute intracranial hemorrhage, mass, midline shift, or extra-axial fluid collection  is seen. There is no abnormal enhancement. Orbits are unremarkable. Mastoid air cells and paranasal sinuses are clear.  Internal carotid arteries are patent from skullbase to carotid termini. There is mild bilateral carotid siphon calcification without evidence of significant stenosis. There is occlusion of the distal right M1 segment. Only minimal flow is present in some distal right MCA branches. Left MCA is unremarkable. ACAs are unremarkable.  Intracranial vertebral arteries are patent to the basilar. PICA origins are patent. AICAs and SCAs are not clearly identified. Basilar artery is small in caliber diffusely, likely on a congenital basis, without focal stenosis. There are patent posterior communicating arteries bilaterally. No PCA stenosis is identified. No intracranial aneurysm is identified.  Review of the MIP images confirms the above findings.  CTA NECK FINDINGS  Three vessel aortic arch. The brachiocephalic and subclavian arteries are unremarkable. Common carotid and cervical internal carotid arteries are patent without stenosis. Proximal left ICA is tortuous. There is suggestion of a slightly beaded appearance to the mid and distal cervical ICAs, however evaluation is mildly limited by motion artifact through this  region. Vertebral arteries are patent and codominant without evidence of stenosis.  Subsegmental atelectasis is present dependently in the visualized lungs. Subcentimeter nodule is incidentally noted in the left thyroid lobe. Mild multilevel cervical spondylosis is noted.  Review of the MIP images confirms the above findings.  IMPRESSION: 1. Distal right M1 occlusion. 2. Mild carotid siphon calcification without significant stenosis. 3. No cervical carotid stenosis. Question bilateral cervical ICA fibromuscular dysplasia. 4. Possible developing low density in the right temporal lobe, parietal lobe, and insula, which may reflect acute infarct.  Critical Value/emergent results (preliminary finding of right M1 occlusion) were called by telephone at the time of interpretation on 06/03/2014 at 6:15 pm to Dr. Alexis Goodell , who verbally acknowledged these results.   Electronically Signed   By: Logan Bores   On: 06/03/2014 18:35   Ct Head Wo Contrast  06/03/2014   CLINICAL DATA:  Stroke, status post interventional radiology procedure. LEFT middle cerebral artery occlusion.  EXAM: CT HEAD WITHOUT CONTRAST  TECHNIQUE: Contiguous axial images were obtained from the base of the skull through the vertex without intravenous contrast.  COMPARISON:  CT of the head June 03, 2014 at 1751 hr  FINDINGS: Persistent blurring of the gray-white matter differentiation of the RIGHT temporal lobe though, somewhat improved from prior examination. Somewhat hypodense LEFT mesial temporal lobe, which could be artifact. Mildly dense intracranial vessels consistent with recent digital subtraction angiography. Minimal density within the RIGHT posterior frontal lobe most consistent with contrast staining corresponding to known RIGHT middle cerebral artery territory infarct. No hemorrhage, midline shift. The ventricles and sulci are overall normal for patient's age. Mild white matter changes suggest chronic small vessel ischemic disease.  No abnormal extra-axial fluid collections. Basal cisterns are patent. Moderate calcific atherosclerosis of the carotid bulbs.  Ocular globes and orbital contents are unremarkable. Paranasal sinuses and mastoid air cells appear well-aerated. No skull fracture.  IMPRESSION: Contrast staining within the RIGHT frontal lobe consistent with recent DSA, without CT findings of hemorrhage or, definite propagation of known RIGHT middle cerebral artery infarct.  Mildly hypodense mesial LEFT temporal lobe likely artifact though, MRI could be performed if there are referable symptoms.   Electronically Signed   By: Elon Alas   On: 06/03/2014 21:48   Ct Head Wo Contrast  06/03/2014   CLINICAL DATA:  Left-sided facial  EXAM: CT HEAD WITHOUT CONTRAST  TECHNIQUE: Contiguous axial  images were obtained from the base of the skull through the vertex without intravenous contrast.  COMPARISON:  None.  FINDINGS: The bony calvarium is intact. No gross soft tissue abnormality is noted. No findings to suggest acute hemorrhage, acute infarction or space-occupying mass lesion are noted.  IMPRESSION: No acute abnormality noted. These results were called by telephone at the time of interpretation on 06/03/2014 at 5:35 pm to Dr. Doy Mince, who verbally acknowledged these results.   Electronically Signed   By: Inez Catalina M.D.   On: 06/03/2014 17:35   Ct Angio Neck W/cm &/or Wo/cm  06/03/2014   CLINICAL DATA:  Code stroke. Left-sided weakness and slurred speech.  EXAM: CT ANGIOGRAPHY HEAD AND NECK  TECHNIQUE: Multidetector CT imaging of the head and neck was performed using the standard protocol during bolus administration of intravenous contrast. Multiplanar CT image reconstructions and MIPs were obtained to evaluate the vascular anatomy. Carotid stenosis measurements (when applicable) are obtained utilizing NASCET criteria, using the distal internal carotid diameter as the denominator.  CONTRAST:  58mL OMNIPAQUE IOHEXOL 350 MG/ML  SOLN  COMPARISON:  Head CT 06/03/2014  FINDINGS: CTA HEAD FINDINGS  On postcontrast head CT images, there is question of developing hypoattenuation in the right temporal lobe, right insula, and possibly right parietal lobe. Ventricles and sulci are within normal limits. No acute intracranial hemorrhage, mass, midline shift, or extra-axial fluid collection is seen. There is no abnormal enhancement. Orbits are unremarkable. Mastoid air cells and paranasal sinuses are clear.  Internal carotid arteries are patent from skullbase to carotid termini. There is mild bilateral carotid siphon calcification without evidence of significant stenosis. There is occlusion of the distal right M1 segment. Only minimal flow is present in some distal right MCA branches. Left MCA is unremarkable. ACAs are unremarkable.  Intracranial vertebral arteries are patent to the basilar. PICA origins are patent. AICAs and SCAs are not clearly identified. Basilar artery is small in caliber diffusely, likely on a congenital basis, without focal stenosis. There are patent posterior communicating arteries bilaterally. No PCA stenosis is identified. No intracranial aneurysm is identified.  Review of the MIP images confirms the above findings.  CTA NECK FINDINGS  Three vessel aortic arch. The brachiocephalic and subclavian arteries are unremarkable. Common carotid and cervical internal carotid arteries are patent without stenosis. Proximal left ICA is tortuous. There is suggestion of a slightly beaded appearance to the mid and distal cervical ICAs, however evaluation is mildly limited by motion artifact through this region. Vertebral arteries are patent and codominant without evidence of stenosis.  Subsegmental atelectasis is present dependently in the visualized lungs. Subcentimeter nodule is incidentally noted in the left thyroid lobe. Mild multilevel cervical spondylosis is noted.  Review of the MIP images confirms the above findings.  IMPRESSION: 1.  Distal right M1 occlusion. 2. Mild carotid siphon calcification without significant stenosis. 3. No cervical carotid stenosis. Question bilateral cervical ICA fibromuscular dysplasia. 4. Possible developing low density in the right temporal lobe, parietal lobe, and insula, which may reflect acute infarct.  Critical Value/emergent results (preliminary finding of right M1 occlusion) were called by telephone at the time of interpretation on 06/03/2014 at 6:15 pm to Dr. Alexis Goodell , who verbally acknowledged these results.   Electronically Signed   By: Logan Bores   On: 06/03/2014 18:35   Mr Lumbar Spine Wo Contrast  06/03/2014   CLINICAL DATA:  Low back pain radiating into the anterior right thigh with numbness for 4 months. No acute injury or  prior relevant surgery. Initial encounter.  EXAM: MRI LUMBAR SPINE WITHOUT CONTRAST  TECHNIQUE: Multiplanar, multisequence MR imaging of the lumbar spine was performed. No intravenous contrast was administered.  COMPARISON:  None.  FINDINGS: Five lumbar type vertebral bodies are assumed. The alignment is normal. There is no evidence of fracture or pars defect. The lumbar pedicles are somewhat short on a congenital basis.  The conus medullaris extends to the L1 level and appears normal. No paraspinal abnormalities are identified.There are probable left renal parapelvic cysts, incompletely visualized.  No significant disc space findings are present at T12-L1 or L1-2.  L2-3: Annular disc bulging eccentric to the left with moderate facet and ligamentous hypertrophy. There are small bilateral facet joint effusions. There is triangulation of the thecal sac with minimal asymmetric narrowing of the left lateral recess and left foramen. No nerve root encroachment identified.  L3-4: Disc height and hydration are maintained. There is mild disc bulging with moderate facet and ligamentous hypertrophy. There are bilateral facet joint effusions. There is borderline spinal stenosis.  The lateral recesses and foramina are patent.  L4-5: There is annular disc bulging with a shallow central disc protrusion. Moderately advanced facet degenerative changes are asymmetric to the right. There is mild spinal stenosis with left greater than right lateral recess narrowing. The foramina are sufficiently patent.  L5-S1: Disc height and hydration are maintained. There is mild bilateral facet hypertrophy. No spinal stenosis or nerve root encroachment.  IMPRESSION: 1. No explanation for the patient's symptoms identified. No evidence of right-sided nerve root encroachment. 2. Shallow central disc protrusion at L4-5 with moderate bilateral facet hypertrophy contributing to mild spinal stenosis and left greater than right lateral recess narrowing. 3. Borderline spinal stenosis and moderate facet hypertrophy at L2-3 and L3-4.   Electronically Signed   By: Camie Patience M.D.   On: 06/03/2014 15:26    EKG: NSR CXR: Hypo inspiratory.   ASSESSMENT / PLAN:  PULMONARY A: Respiratory failure requiring mechanical ventilation P:   Maintain PRVC at 8 cc/kg of IBW, maintain pH of >7.30. Consider changing to PS in the AM with SBT and extubation.  CARDIOVASCULAR A: Hx of HTN P:   Maintain 120 > SBP > 140 per IR recs given arterial access in stroke w/ intervention by VIR  RENAL A: AKI on CKD Hyperkalemia P:   No EKG changes. Repeat chemistries due to striking changes in lab values over short period of time (BUN of 42->26 in 7 minutes, K of 7.8, to 5.6)  GASTROINTESTINAL A: No active issues P: Keep NPO in anticipation of extubation tomorrow.  HEMATOLOGIC A: Polycythemia P:   May be lab error, or possible hx of chronic hypoxia.  INFECTIOUS A: No active issues. P:    ENDOCRINE A: Hx of HLD P:   Will need statin in setting of acute stroke, defer to neurology for this.  NEUROLOGIC A: R MCA Stroke, s/p mechanical clot removal Sedation for mechanical ventilation P:   Defer  antiplatelet strategy to neurology. Will maintain sedation with short-acting agent (propofol) with anticipation of extubation tomorrow AM if patient continues to do well.  TODAY'S SUMMARY: 68 y/o woman with risk factors of HTN and HLD who presents with acute stroke s/p mechanical thrombus removal.  I have personally obtained a history, examined the patient, evaluated laboratory and imaging results, formulated the assessment and plan and placed orders.  CRITICAL CARE: The patient is critically ill with multiple organ systems failure and requires high complexity decision making for assessment and  support, frequent evaluation and titration of therapies, application of advanced monitoring technologies and extensive interpretation of multiple databases. Critical Care Time devoted to patient care services described in this note is 45 minutes.   Luz Brazen, MD Pulmonary & Critical Care Medicine June 03, 2014, 11:51 PM

## 2014-06-03 NOTE — Sedation Documentation (Signed)
Right femoral artery acesssed by Dr Estanislado Pandy

## 2014-06-03 NOTE — Anesthesia Preprocedure Evaluation (Signed)
Anesthesia Evaluation  Patient identified by MRN, date of birth, ID band Patient confused  Preop documentation limited or incomplete due to emergent nature of procedure.  Airway   TM Distance: >3 FB    Comment: Non-cooperative Dental   Pulmonary former smoker,          Cardiovascular hypertension, Pt. on medications Rhythm:Regular     Neuro/Psych Acute ischemic stroke s/p tpa with residual symptoms CVA, Residual Symptoms    GI/Hepatic   Endo/Other  Morbid obesity  Renal/GU Renal InsufficiencyRenal disease     Musculoskeletal   Abdominal   Peds  Hematology   Anesthesia Other Findings   Reproductive/Obstetrics                             Anesthesia Physical Anesthesia Plan  ASA: IV  Anesthesia Plan: General   Post-op Pain Management:    Induction: Intravenous, Rapid sequence and Cricoid pressure planned  Airway Management Planned: Oral ETT  Additional Equipment: Arterial line  Intra-op Plan:   Post-operative Plan: Post-operative intubation/ventilation  Informed Consent:   Only emergency history available  Plan Discussed with: CRNA and Surgeon  Anesthesia Plan Comments:         Anesthesia Quick Evaluation

## 2014-06-03 NOTE — ED Notes (Signed)
Stroke team and Neurologist at bedside discussing options with family.

## 2014-06-03 NOTE — Code Documentation (Signed)
68 year old female presents to [facility] as Code Stroke.  On arrival patient is drowsy but easily arousable left hemiparesis left neglect slurred speech partial gaze palsy.  EMS reports LSW at 1230 today.  Then took a nap and was discovered by family with left hemiparesis and slurred speech.  EMS reports patient had MRI today for back pain and reports headache times 3 days.  CT scan done - second IV started for CTA.  Dr. Doy Mince present.  Back to ED BP 112/62  HR 60 NS 500 cc bolus started.  NIHSS 18.  Family here - now report that LSW was 1330 - patient was sitting on the side of the bed at home after her MRI - all was normal - she ate lunch and then laid down for nap.  LSW now reported as 1330.  Dr. Doy Mince speaking with family - Dr. Estanislado Pandy called by Dr. Doy Mince.  Patient prepared for IR - awaiting team.  Foley placed per ED RN Richardson Landry.  Patient began to move left leg - can lift it off bed - has slight drift.  Rest of neuro exam remains the same.  Transferred to IR suite with ED staff and Sharion Settler from IR. Dr. Estanislado Pandy present - handoff to [facility] RN and [facility] CRNA .  Family to radiology waiting area - Dr. Estanislado Pandy spoke with husband and daughter.  Updated family.  Spoke with 51M RN with update also.

## 2014-06-03 NOTE — Progress Notes (Signed)
Total reperfusion per Dr Estanislado Pandy

## 2014-06-03 NOTE — Transfer of Care (Signed)
Immediate Anesthesia Transfer of Care Note  Patient: Mary Le  Procedure(s) Performed: Procedure(s): RADIOLOGY WITH ANESTHESIA (N/A)  Patient Location: ICU  Anesthesia Type:General  Level of Consciousness: sedated and Patient remains intubated per anesthesia plan  Airway & Oxygen Therapy: Patient remains intubated per anesthesia plan and Patient placed on Ventilator (see vital sign flow sheet for setting)  Post-op Assessment: Report given to NICU RN  Post vital signs: stable  Complications: No apparent anesthesia complications

## 2014-06-03 NOTE — ED Provider Notes (Signed)
Level V caveat code stroke patient last seen normal at 12:30 PM today. Patient complains of headache. She noted to have left-sided mouth droop eyes are deviated to the right. Heart regular rate and rhythm abdomen obese nontender neurologic sleepy arousable to verbal stimulus. Follow simple commands. Left-sided hemi-paresis DTRs symmetric bilaterally knee jerk ankle jerk biceps toes neither upward or downward going. Code stroke called Patient will be admitted to stroke service under Dr. Doy Mince. She will go to interventional radiology suite from the emergency department  Orlie Dakin, MD 06/03/14 [phone]

## 2014-06-03 NOTE — ED Provider Notes (Signed)
CSN: [id number]     Arrival date & time 06/03/14  1713 History   First MD Initiated Contact with Patient 06/03/14 1727     No chief complaint on file.    (Consider location/radiation/quality/duration/timing/severity/associated sxs/prior Treatment) Patient is a 68 y.o. female presenting with weakness. The history is provided by the patient. No language interpreter was used.  Weakness This is a new problem. The current episode started today (12:30PM last normal). The problem occurs constantly. The problem has been gradually worsening. Associated symptoms include headaches and weakness. Associated symptoms comments: Left facial droop, left arm and leg weakness. Nothing aggravates the symptoms. She has tried nothing for the symptoms. The treatment provided no relief.    Past Medical History  Diagnosis Date  . Hypertension   . Hypercholesteremia   . Back pain     arthritis   Past Surgical History  Procedure Laterality Date  . Appendectomy    . Tonsillectomy    . Hemorroidectomy    . Tubal ligation     Family History  Problem Relation Age of Onset  . Heart disease Mother   . Kidney disease Father    History  Substance Use Topics  . Smoking status: Former Smoker -- 5 years  . Smokeless tobacco: Never Used  . Alcohol Use: Yes     Comment: twice a week. Wine or liquor    OB History    No data available     Review of Systems  Unable to perform ROS: Acuity of condition  Neurological: Positive for weakness and headaches.      Allergies  Aspirin and Statins  Home Medications   Prior to Admission medications   Medication Sig Start Date End Date Taking? Authorizing Provider  acetaminophen (TYLENOL) 325 MG tablet Take 650 mg by mouth every 6 (six) hours as needed (pain).     Historical Provider, MD  azithromycin (ZITHROMAX) 250 MG tablet Take 2 tabs PO x 1 dose, then 1 tab PO QD x 4 days 04/12/14   Roselee Culver, MD  B Complex Vitamins (B COMPLEX-B12 PO) Take 1 tablet  by mouth daily.     Historical Provider, MD  fish oil-omega-3 fatty acids 1000 MG capsule Take 1 g by mouth daily.     Historical Provider, MD  lisinopril (PRINIVIL,ZESTRIL) 20 MG tablet Take 1 tablet (20 mg total) by mouth daily. 04/22/14   Chelle S Jeffery, PA-C  loratadine (ALAVERT) 10 MG tablet Take 10 mg by mouth daily as needed for allergies.    Historical Provider, MD  meloxicam (MOBIC) 7.5 MG tablet Take 1-2 tablets (7.5-15 mg total) by mouth daily. 01/03/14   [name], MD  metoprolol succinate (TOPROL-XL) 25 MG 24 hr tablet Take 1 tablet (25 mg total) by mouth daily. 04/22/14   Chelle Janalee Dane, PA-C  Multiple Vitamin (MULTIVITAMIN WITH MINERALS) TABS tablet Take 1 tablet by mouth daily.    Historical Provider, MD  Polyethyl Glycol-Propyl Glycol (SYSTANE OP) Place 1 drop into both eyes daily as needed (dry eyeys).    Historical Provider, MD  promethazine-codeine (PHENERGAN WITH CODEINE) 6.25-10 MG/5ML syrup Take 5-10 mLs by mouth every 6 (six) hours as needed. 04/12/14   Roselee Culver, MD   BP 143/93 mmHg  Pulse 76  Temp(Src) 97.7 F (36.5 C) (Oral)  Resp 15  Ht 5\' 3"  (1.6 m)  Wt 225 lb 1.4 oz (102.099 kg)  BMI 39.88 kg/m2  SpO2 99% Physical Exam  Constitutional: She is oriented to  person, place, and time. She appears well-developed and well-nourished.  HENT:  Head: Normocephalic and atraumatic.  Right Ear: External ear normal.  Left Ear: External ear normal.  Eyes: EOM are normal.  Neck: Normal range of motion. Neck supple.  Cardiovascular: Normal rate, regular rhythm and intact distal pulses.  Exam reveals no gallop and no friction rub.   No murmur heard. Pulmonary/Chest: Effort normal and breath sounds normal. No respiratory distress. She has no wheezes. She has no rales. She exhibits no tenderness.  Abdominal: Soft. Bowel sounds are normal. She exhibits no distension. There is no tenderness. There is no rebound.  Musculoskeletal: Normal range of motion. She  exhibits no edema or tenderness.  Lymphadenopathy:    She has no cervical adenopathy.  Neurological: She is alert and oriented to person, place, and time.  Neurologic exam: CN I-XII: grossly intact, Sensation: normal in upper and lower extremities, Strength 5/5 in both right upper and lower extremities, 0/5 strength in left upper and lower extremities. Left sided facial droop  Skin: Skin is warm. No rash noted.  Psychiatric: She has a normal mood and affect. Her behavior is normal.  Nursing note and vitals reviewed.   ED Course  Procedures (including critical care time) Labs Review Labs Reviewed  CBC - Abnormal; Notable for the following:    WBC 18.1 (*)    RBC 5.76 (*)    Hemoglobin 16.1 (*)    HCT 48.2 (*)    All other components within normal limits  DIFFERENTIAL - Abnormal; Notable for the following:    Neutrophils Relative % 82 (*)    Neutro Abs 14.9 (*)    All other components within normal limits  I-STAT CHEM 8, ED - Abnormal; Notable for the following:    Sodium 134 (*)    Potassium 7.8 (*)    BUN 42 (*)    Glucose, Bld 120 (*)    Calcium, Ion 1.08 (*)    Hemoglobin 18.0 (*)    HCT 53.0 (*)    All other components within normal limits  CBG MONITORING, ED - Abnormal; Notable for the following:    Glucose-Capillary 103 (*)    All other components within normal limits  PROTIME-INR  APTT  URINE RAPID DRUG SCREEN (HOSP PERFORMED)  URINALYSIS, ROUTINE W REFLEX MICROSCOPIC  COMPREHENSIVE METABOLIC PANEL  ETHANOL  I-STAT TROPOININ, ED  I-STAT TROPOININ, ED    Imaging Review Ct Angio Head W/cm &/or Wo Cm  06/03/2014   CLINICAL DATA:  Code stroke. Left-sided weakness and slurred speech.  EXAM: CT ANGIOGRAPHY HEAD AND NECK  TECHNIQUE: Multidetector CT imaging of the head and neck was performed using the standard protocol during bolus administration of intravenous contrast. Multiplanar CT image reconstructions and MIPs were obtained to evaluate the vascular anatomy.  Carotid stenosis measurements (when applicable) are obtained utilizing NASCET criteria, using the distal internal carotid diameter as the denominator.  CONTRAST:  46mL OMNIPAQUE IOHEXOL 350 MG/ML SOLN  COMPARISON:  Head CT 06/03/2014  FINDINGS: CTA HEAD FINDINGS  On postcontrast head CT images, there is question of developing hypoattenuation in the right temporal lobe, right insula, and possibly right parietal lobe. Ventricles and sulci are within normal limits. No acute intracranial hemorrhage, mass, midline shift, or extra-axial fluid collection is seen. There is no abnormal enhancement. Orbits are unremarkable. Mastoid air cells and paranasal sinuses are clear.  Internal carotid arteries are patent from skullbase to carotid termini. There is mild bilateral carotid siphon calcification without evidence of significant  stenosis. There is occlusion of the distal right M1 segment. Only minimal flow is present in some distal right MCA branches. Left MCA is unremarkable. ACAs are unremarkable.  Intracranial vertebral arteries are patent to the basilar. PICA origins are patent. AICAs and SCAs are not clearly identified. Basilar artery is small in caliber diffusely, likely on a congenital basis, without focal stenosis. There are patent posterior communicating arteries bilaterally. No PCA stenosis is identified. No intracranial aneurysm is identified.  Review of the MIP images confirms the above findings.  CTA NECK FINDINGS  Three vessel aortic arch. The brachiocephalic and subclavian arteries are unremarkable. Common carotid and cervical internal carotid arteries are patent without stenosis. Proximal left ICA is tortuous. There is suggestion of a slightly beaded appearance to the mid and distal cervical ICAs, however evaluation is mildly limited by motion artifact through this region. Vertebral arteries are patent and codominant without evidence of stenosis.  Subsegmental atelectasis is present dependently in the  visualized lungs. Subcentimeter nodule is incidentally noted in the left thyroid lobe. Mild multilevel cervical spondylosis is noted.  Review of the MIP images confirms the above findings.  IMPRESSION: 1. Distal right M1 occlusion. 2. Mild carotid siphon calcification without significant stenosis. 3. No cervical carotid stenosis. Question bilateral cervical ICA fibromuscular dysplasia. 4. Possible developing low density in the right temporal lobe, parietal lobe, and insula, which may reflect acute infarct.  Critical Value/emergent results (preliminary finding of right M1 occlusion) were called by telephone at the time of interpretation on 06/03/2014 at 6:15 pm to Dr. Alexis Goodell , who verbally acknowledged these results.   Electronically Signed   By: Logan Bores   On: 06/03/2014 18:35   Ct Head Wo Contrast  06/03/2014   CLINICAL DATA:  Stroke, status post interventional radiology procedure. LEFT middle cerebral artery occlusion.  EXAM: CT HEAD WITHOUT CONTRAST  TECHNIQUE: Contiguous axial images were obtained from the base of the skull through the vertex without intravenous contrast.  COMPARISON:  CT of the head June 03, 2014 at 1751 hr  FINDINGS: Persistent blurring of the gray-white matter differentiation of the RIGHT temporal lobe though, somewhat improved from prior examination. Somewhat hypodense LEFT mesial temporal lobe, which could be artifact. Mildly dense intracranial vessels consistent with recent digital subtraction angiography. Minimal density within the RIGHT posterior frontal lobe most consistent with contrast staining corresponding to known RIGHT middle cerebral artery territory infarct. No hemorrhage, midline shift. The ventricles and sulci are overall normal for patient's age. Mild white matter changes suggest chronic small vessel ischemic disease. No abnormal extra-axial fluid collections. Basal cisterns are patent. Moderate calcific atherosclerosis of the carotid bulbs.  Ocular  globes and orbital contents are unremarkable. Paranasal sinuses and mastoid air cells appear well-aerated. No skull fracture.  IMPRESSION: Contrast staining within the RIGHT frontal lobe consistent with recent DSA, without CT findings of hemorrhage or, definite propagation of known RIGHT middle cerebral artery infarct.  Mildly hypodense mesial LEFT temporal lobe likely artifact though, MRI could be performed if there are referable symptoms.   Electronically Signed   By: Elon Alas   On: 06/03/2014 21:48   Ct Head Wo Contrast  06/03/2014   CLINICAL DATA:  Left-sided facial  EXAM: CT HEAD WITHOUT CONTRAST  TECHNIQUE: Contiguous axial images were obtained from the base of the skull through the vertex without intravenous contrast.  COMPARISON:  None.  FINDINGS: The bony calvarium is intact. No gross soft tissue abnormality is noted. No findings to suggest acute hemorrhage, acute  infarction or space-occupying mass lesion are noted.  IMPRESSION: No acute abnormality noted. These results were called by telephone at the time of interpretation on 06/03/2014 at 5:35 pm to Dr. Doy Mince, who verbally acknowledged these results.   Electronically Signed   By: Inez Catalina M.D.   On: 06/03/2014 17:35   Ct Angio Neck W/cm &/or Wo/cm  06/03/2014   CLINICAL DATA:  Code stroke. Left-sided weakness and slurred speech.  EXAM: CT ANGIOGRAPHY HEAD AND NECK  TECHNIQUE: Multidetector CT imaging of the head and neck was performed using the standard protocol during bolus administration of intravenous contrast. Multiplanar CT image reconstructions and MIPs were obtained to evaluate the vascular anatomy. Carotid stenosis measurements (when applicable) are obtained utilizing NASCET criteria, using the distal internal carotid diameter as the denominator.  CONTRAST:  61mL OMNIPAQUE IOHEXOL 350 MG/ML SOLN  COMPARISON:  Head CT 06/03/2014  FINDINGS: CTA HEAD FINDINGS  On postcontrast head CT images, there is question of developing  hypoattenuation in the right temporal lobe, right insula, and possibly right parietal lobe. Ventricles and sulci are within normal limits. No acute intracranial hemorrhage, mass, midline shift, or extra-axial fluid collection is seen. There is no abnormal enhancement. Orbits are unremarkable. Mastoid air cells and paranasal sinuses are clear.  Internal carotid arteries are patent from skullbase to carotid termini. There is mild bilateral carotid siphon calcification without evidence of significant stenosis. There is occlusion of the distal right M1 segment. Only minimal flow is present in some distal right MCA branches. Left MCA is unremarkable. ACAs are unremarkable.  Intracranial vertebral arteries are patent to the basilar. PICA origins are patent. AICAs and SCAs are not clearly identified. Basilar artery is small in caliber diffusely, likely on a congenital basis, without focal stenosis. There are patent posterior communicating arteries bilaterally. No PCA stenosis is identified. No intracranial aneurysm is identified.  Review of the MIP images confirms the above findings.  CTA NECK FINDINGS  Three vessel aortic arch. The brachiocephalic and subclavian arteries are unremarkable. Common carotid and cervical internal carotid arteries are patent without stenosis. Proximal left ICA is tortuous. There is suggestion of a slightly beaded appearance to the mid and distal cervical ICAs, however evaluation is mildly limited by motion artifact through this region. Vertebral arteries are patent and codominant without evidence of stenosis.  Subsegmental atelectasis is present dependently in the visualized lungs. Subcentimeter nodule is incidentally noted in the left thyroid lobe. Mild multilevel cervical spondylosis is noted.  Review of the MIP images confirms the above findings.  IMPRESSION: 1. Distal right M1 occlusion. 2. Mild carotid siphon calcification without significant stenosis. 3. No cervical carotid stenosis.  Question bilateral cervical ICA fibromuscular dysplasia. 4. Possible developing low density in the right temporal lobe, parietal lobe, and insula, which may reflect acute infarct.  Critical Value/emergent results (preliminary finding of right M1 occlusion) were called by telephone at the time of interpretation on 06/03/2014 at 6:15 pm to Dr. Alexis Goodell , who verbally acknowledged these results.   Electronically Signed   By: Logan Bores   On: 06/03/2014 18:35   Mr Lumbar Spine Wo Contrast  06/03/2014   CLINICAL DATA:  Low back pain radiating into the anterior right thigh with numbness for 4 months. No acute injury or prior relevant surgery. Initial encounter.  EXAM: MRI LUMBAR SPINE WITHOUT CONTRAST  TECHNIQUE: Multiplanar, multisequence MR imaging of the lumbar spine was performed. No intravenous contrast was administered.  COMPARISON:  None.  FINDINGS: Five lumbar type vertebral bodies  are assumed. The alignment is normal. There is no evidence of fracture or pars defect. The lumbar pedicles are somewhat short on a congenital basis.  The conus medullaris extends to the L1 level and appears normal. No paraspinal abnormalities are identified.There are probable left renal parapelvic cysts, incompletely visualized.  No significant disc space findings are present at T12-L1 or L1-2.  L2-3: Annular disc bulging eccentric to the left with moderate facet and ligamentous hypertrophy. There are small bilateral facet joint effusions. There is triangulation of the thecal sac with minimal asymmetric narrowing of the left lateral recess and left foramen. No nerve root encroachment identified.  L3-4: Disc height and hydration are maintained. There is mild disc bulging with moderate facet and ligamentous hypertrophy. There are bilateral facet joint effusions. There is borderline spinal stenosis. The lateral recesses and foramina are patent.  L4-5: There is annular disc bulging with a shallow central disc protrusion.  Moderately advanced facet degenerative changes are asymmetric to the right. There is mild spinal stenosis with left greater than right lateral recess narrowing. The foramina are sufficiently patent.  L5-S1: Disc height and hydration are maintained. There is mild bilateral facet hypertrophy. No spinal stenosis or nerve root encroachment.  IMPRESSION: 1. No explanation for the patient's symptoms identified. No evidence of right-sided nerve root encroachment. 2. Shallow central disc protrusion at L4-5 with moderate bilateral facet hypertrophy contributing to mild spinal stenosis and left greater than right lateral recess narrowing. 3. Borderline spinal stenosis and moderate facet hypertrophy at L2-3 and L3-4.   Electronically Signed   By: Camie Patience M.D.   On: 06/03/2014 15:26   Portable Chest Xray  06/03/2014   CLINICAL DATA:  Acute respiratory failure  EXAM: PORTABLE CHEST - 1 VIEW  COMPARISON:  04/12/2014  FINDINGS: There is a endotracheal tube with the tip 4.2 cm above the carina. There is elevation of the right diaphragm. There is left lower lobe airspace disease likely reflecting atelectasis versus pneumonia. There is no pleural effusion or pneumothorax. The heart and mediastinal contours are unremarkable.  The osseous structures are unremarkable.  IMPRESSION: 1. Endotracheal tube with the tip 4.2 cm above the carina. 2. Left lower lobe airspace disease which may reflect atelectasis versus developing pneumonia.   Electronically Signed   By: Kathreen Devoid   On: 06/03/2014 23:36     EKG Interpretation   Date/Time:  Friday June 03 2014 17:55:25 EST Ventricular Rate:  64 PR Interval:  168 QRS Duration: 82 QT Interval:  449 QTC Calculation: 463 R Axis:   43 Text Interpretation:  Sinus rhythm Borderline low voltage, extremity leads  No significant change since last tracing Confirmed by Winfred Leeds  MD, SAM  [phone]) on 06/03/2014 6:16:43 PM      MDM   Final diagnoses:  Stroke  Cerebral  infarct  Acute respiratory failure  Abdominal pain    5:27 PM Pt is a 68 y.o. female with pertinent PMHX of HTN HLD who presents to the ED with code stroke. Patient last normal at 12:30PM. Was found by family to have left sided weakness. Left sided facial weakness. States sensation is intact. Endorses headache that started this morning  On arrival: airway intact. Left sided facial droop. Left sided weakness concerning for stroke. Plan for CT head and code stroke labs  Initial chem 8 likely hemolyzed  CT head wo contrast showed no acute intracranial abnormalities. Per neurology: outside of window for TPA. Plan for thrombectomy. Plan for CTA head neck  EKG personally  reviewed by myself showed NSR, low voltage Rate of 64, PR 140ms, QRS 44ms QT/QTC 449/48ms, normal axis, without evidence of new ischemia. Comparison showed similar, indication: code stroke  CTA head neck showed distal right M1 occlusion, no cervical carotid stenosis. Possible acute infarct of the right temporal and parietal love and insula  Review of labs CMP: hyponatremia, hyperkalemia, Cr 1.16 iStat troponin: 0.00 CBC: leukocytosis, H&H 16.1/48.2 INR: 1.04 ETOH: <11 UDS: THC positive Ua no evidence of UTI  Plan for admission and to go to IR for thrombectomy.  Labs, EKG and imaging reviewed by myself and considered in medical decision making if ordered.  Imaging interpreted by radiology. Pt was discussed with my attending, Dr. Marijo Conception, MD 06/04/14 [place], MD 06/04/14 [phone]

## 2014-06-03 NOTE — Progress Notes (Signed)
Emergent cerebral angiogram, Right femoral access by Dr Estanislado Pandy

## 2014-06-03 NOTE — Procedures (Signed)
S/P rt common carotid arteriogram  Followed by complete revascularization of occluded RT MCA , Using 5 mg of superselective integrelin and x2 passes with  A 4 x 71mm Solitaire FR retrieval device.

## 2014-06-03 NOTE — Sedation Documentation (Signed)
Total reperfusion per Dr Estanislado Pandy

## 2014-06-03 NOTE — Sedation Documentation (Signed)
Total reperfusion per Dr Estanislado Pandy.  Initial occlusion at M2 branch, after 1st pass occluded at M1.

## 2014-06-03 NOTE — ED Notes (Signed)
Elevated K on Chem-8 reported to Dr. Leata Mouse

## 2014-06-03 NOTE — Sedation Documentation (Signed)
To CT then 3100

## 2014-06-03 NOTE — H&P (Signed)
Admission H&P    Chief Complaint: Left sided weakness HPI: Mary Le is an 68 y.o. female who awakened normal today. Went to have an MRI and returned normal. She did complain of not feeling well and laid down for a nap.   When her family went to awaken her they noted a left facial droop and left sided weakness. EMS was called at that time and the patient was brought in as a code stroke.  Initial NIHSS of 18. Per family patient has complained of a headache for the past 3 days.  Date last known well: Date: 06/03/2014 Time last known well: Time: 13:30 tPA Given: No: Outside time window  Past Medical History  Diagnosis Date  . Hypertension   . Hypercholesteremia   . Back pain     arthritis    Past Surgical History  Procedure Laterality Date  . Appendectomy    . Tonsillectomy    . Hemorroidectomy    . Tubal ligation      Family History  Problem Relation Age of Onset  . Heart disease Mother   . Kidney disease Father    Social History:  reports that she has quit smoking. She has never used smokeless tobacco. She reports that she drinks alcohol. She reports that she does not use illicit drugs.  Allergies:  Allergies  Allergen Reactions  . Aspirin Nausea And Vomiting  . Statins Other (See Comments)    Myalgias and chest pain   Medications:  Current outpatient prescriptions: acetaminophen (TYLENOL) 325 MG tablet, Take 650 mg by mouth every 6 (six) hours as needed (pain). , Disp: , Rfl: ;  azithromycin (ZITHROMAX) 250 MG tablet, Take 2 tabs PO x 1 dose, then 1 tab PO QD x 4 days, Disp: 6 tablet, Rfl: 0;  B Complex Vitamins (B COMPLEX-B12 PO), Take 1 tablet by mouth daily. , Disp: , Rfl: ;  fish oil-omega-3 fatty acids 1000 MG capsule, Take 1 g by mouth daily. , Disp: , Rfl:  lisinopril (PRINIVIL,ZESTRIL) 20 MG tablet, Take 1 tablet (20 mg total) by mouth daily., Disp: 90 tablet, Rfl: 0;  loratadine (ALAVERT) 10 MG tablet, Take 10 mg by mouth daily as needed for allergies., Disp: ,  Rfl: ;  meloxicam (MOBIC) 7.5 MG tablet, Take 1-2 tablets (7.5-15 mg total) by mouth daily., Disp: 30 tablet, Rfl: 1;  metoprolol succinate (TOPROL-XL) 25 MG 24 hr tablet, Take 1 tablet (25 mg total) by mouth daily., Disp: 90 tablet, Rfl: 0 Multiple Vitamin (MULTIVITAMIN WITH MINERALS) TABS tablet, Take 1 tablet by mouth daily., Disp: , Rfl: ;  Polyethyl Glycol-Propyl Glycol (SYSTANE OP), Place 1 drop into both eyes daily as needed (dry eyeys)., Disp: , Rfl: ;  promethazine-codeine (PHENERGAN WITH CODEINE) 6.25-10 MG/5ML syrup, Take 5-10 mLs by mouth every 6 (six) hours as needed., Disp: 120 mL, Rfl: 0  ROS: History obtained from the patient  General ROS: negative for - chills, fatigue, fever, night sweats, weight gain or weight loss Psychological ROS: negative for - behavioral disorder, hallucinations, memory difficulties, mood swings or suicidal ideation Ophthalmic ROS: negative for - blurry vision, double vision, eye pain or loss of vision ENT ROS: negative for - epistaxis, nasal discharge, oral lesions, sore throat, tinnitus or vertigo Allergy and Immunology ROS: negative for - hives or itchy/watery eyes Hematological and Lymphatic ROS: negative for - bleeding problems, bruising or swollen lymph nodes Endocrine ROS: negative for - galactorrhea, hair pattern changes, polydipsia/polyuria or temperature intolerance Respiratory ROS: negative for - cough,  hemoptysis, shortness of breath or wheezing Cardiovascular ROS: negative for - chest pain, dyspnea on exertion, edema or irregular heartbeat Gastrointestinal ROS: negative for - abdominal pain, diarrhea, hematemesis, nausea/vomiting or stool incontinence Genito-Urinary ROS: negative for - dysuria, hematuria, incontinence or urinary frequency/urgency Musculoskeletal ROS: back pain Neurological ROS: as noted in HPI Dermatological ROS: negative for rash and skin lesion changes  Physical Examination: Pulse 67, temperature 97.7 F (36.5 C),  temperature source Oral, resp. rate 19, height 5\' 3"  (1.6 m), weight 102.099 kg (225 lb 1.4 oz), SpO2 100 %.  General Examination: HEENT-  Normocephalic, no lesions, without obvious abnormality.  Normal external eye and conjunctiva.  Normal TM's bilaterally.  Normal auditory canals and external ears. Normal external nose, mucus membranes and septum.  Normal pharynx. Neck supple with no masses, nodes, nodules or enlargement. Cardiovascular - S1, S2 normal Lungs - chest clear, no wheezing, rales, normal symmetric air entry Abdomen - soft, non-tender; bowel sounds normal; no masses,  no organomegaly Extremities - no edema  Neurologic Examination: Mental Status: Alert, oriented, thought content appropriate. Speech fluent without evidence of aphasia. Dysarthric.  Left neglect.  Able to follow 3 step commands without difficulty. Cranial Nerves: II: Discs flat bilaterally; LHH, pupils equal, round, reactive to light and accommodation III,IV, VI: ptosis not present, right gaze preference V,VII: left facial droop, facial light touch sensation absent on the left VIII: hearing normal bilaterally IX,X: gag reflex reduced XI: shoulder shrug decreased on the left XII: midline tongue extension Motor: Right :Upper extremity 5/5Left: Upper extremity 1/5 Lower extremity 5/5Lower extremity 0/5 Tone and bulk:tone slightly increased on the left Sensory: Pinprick and light touch sensation absent on the left Deep Tendon Reflexes: 2+ and symmetric throughout with absent AJ's bilaterally Plantars: Right: muteLeft: mute Cerebellar: normal finger-to-nose and normal heel-to-shin testing on the right Gait: Unable to perform secondary to weakness CV: pulses palpable throughout     Laboratory Studies:   Basic Metabolic Panel:  Recent Labs Lab 06/03/14 1727   NA 134*  K 7.8*  CL 109  GLUCOSE 120*  BUN 42*  CREATININE 1.10    Liver Function Tests: No results for input(s): AST, ALT, ALKPHOS, BILITOT, PROT, ALBUMIN in the last 168 hours. No results for input(s): LIPASE, AMYLASE in the last 168 hours. No results for input(s): AMMONIA in the last 168 hours.  CBC:  Recent Labs Lab 06/03/14 1715 06/03/14 1727  WBC 18.1*  --   NEUTROABS 14.9*  --   HGB 16.1* 18.0*  HCT 48.2* 53.0*  MCV 83.7  --   PLT 219  --     Cardiac Enzymes: No results for input(s): CKTOTAL, CKMB, CKMBINDEX, TROPONINI in the last 168 hours.  BNP: Invalid input(s): POCBNP  CBG:  Recent Labs Lab 06/03/14 1808  GLUCAP 103*    Microbiology: No results found for this or any previous visit.  Coagulation Studies:  Recent Labs  06/03/14 1735  LABPROT 13.7  INR 1.04    Urinalysis: No results for input(s): COLORURINE, LABSPEC, PHURINE, GLUCOSEU, HGBUR, BILIRUBINUR, KETONESUR, PROTEINUR, UROBILINOGEN, NITRITE, LEUKOCYTESUR in the last 168 hours.  Invalid input(s): APPERANCEUR  Lipid Panel:     Component Value Date/Time   CHOL 255* 01/03/2014 1614   TRIG 146 01/03/2014 1614   HDL 48 01/03/2014 1614   CHOLHDL 5.3 01/03/2014 1614   VLDL 29 01/03/2014 1614   LDLCALC 178* 01/03/2014 1614    HgbA1C: No results found for: HGBA1C  Urine Drug Screen:  No results found for: LABOPIA, COCAINSCRNUR,  LABBENZ, AMPHETMU, THCU, LABBARB  Alcohol Level: No results for input(s): ETH in the last 168 hours.  Other results: EKG: normal sinus rhythm at 64 bpm.  Imaging: Ct Head Wo Contrast  06/03/2014   CLINICAL DATA:  Left-sided facial  EXAM: CT HEAD WITHOUT CONTRAST  TECHNIQUE: Contiguous axial images were obtained from the base of the skull through the vertex without intravenous contrast.  COMPARISON:  None.  FINDINGS: The bony calvarium is intact. No gross soft tissue abnormality is noted. No findings to suggest acute hemorrhage, acute infarction or  space-occupying mass lesion are noted.  IMPRESSION: No acute abnormality noted. These results were called by telephone at the time of interpretation on 06/03/2014 at 5:35 pm to Dr. Doy Mince, who verbally acknowledged these results.   Electronically Signed   By: Inez Catalina M.D.   On: 06/03/2014 17:35   Mr Lumbar Spine Wo Contrast  06/03/2014   CLINICAL DATA:  Low back pain radiating into the anterior right thigh with numbness for 4 months. No acute injury or prior relevant surgery. Initial encounter.  EXAM: MRI LUMBAR SPINE WITHOUT CONTRAST  TECHNIQUE: Multiplanar, multisequence MR imaging of the lumbar spine was performed. No intravenous contrast was administered.  COMPARISON:  None.  FINDINGS: Five lumbar type vertebral bodies are assumed. The alignment is normal. There is no evidence of fracture or pars defect. The lumbar pedicles are somewhat short on a congenital basis.  The conus medullaris extends to the L1 level and appears normal. No paraspinal abnormalities are identified.There are probable left renal parapelvic cysts, incompletely visualized.  No significant disc space findings are present at T12-L1 or L1-2.  L2-3: Annular disc bulging eccentric to the left with moderate facet and ligamentous hypertrophy. There are small bilateral facet joint effusions. There is triangulation of the thecal sac with minimal asymmetric narrowing of the left lateral recess and left foramen. No nerve root encroachment identified.  L3-4: Disc height and hydration are maintained. There is mild disc bulging with moderate facet and ligamentous hypertrophy. There are bilateral facet joint effusions. There is borderline spinal stenosis. The lateral recesses and foramina are patent.  L4-5: There is annular disc bulging with a shallow central disc protrusion. Moderately advanced facet degenerative changes are asymmetric to the right. There is mild spinal stenosis with left greater than right lateral recess narrowing. The  foramina are sufficiently patent.  L5-S1: Disc height and hydration are maintained. There is mild bilateral facet hypertrophy. No spinal stenosis or nerve root encroachment.  IMPRESSION: 1. No explanation for the patient's symptoms identified. No evidence of right-sided nerve root encroachment. 2. Shallow central disc protrusion at L4-5 with moderate bilateral facet hypertrophy contributing to mild spinal stenosis and left greater than right lateral recess narrowing. 3. Borderline spinal stenosis and moderate facet hypertrophy at L2-3 and L3-4.   Electronically Signed   By: Camie Patience M.D.   On: 06/03/2014 15:26    Assessment: 68 y.o. female presenting with left hemiplegia, LHH, left sided neglect and right eye deviation.  Acute infarct presumed.  Initial LKW time given as 1230.  Once family arrived LKW time changed to 1330.  Patient outside tPA window at that time.  Head CT reviewed and shows no acute changes.  CTA reviewed and shows a right M1 occlusion.  Consent obtained from family verbally.  IR contacted and patient to undergo mechanical thrombectomy.    Stroke Risk Factors - hypertension  Plan: 1. HgbA1c, fasting lipid panel 2. MRI, MRA  of the brain without contrast  3. PT consult, OT consult, Speech consult 4. Echocardiogram 5. Carotid dopplers 6. Intervention with admission to NICU post procedure. 7. Telemetry monitoring 8. Frequent neuro checks  This patient is critically ill and at significant risk of neurological worsening, death and care requires constant monitoring of vital signs, hemodynamics,respiratory and cardiac monitoring, neurological assessment, discussion with family, other specialists and medical decision making of high complexity. I spent 90 minutes of neurocritical care time  in the care of  this patient.  Alexis Goodell, MD [facility] [phone] 06/03/2014  6:26 PM

## 2014-06-03 NOTE — Anesthesia Postprocedure Evaluation (Signed)
  Anesthesia Post-op Note  Patient: Mary Le  Procedure(s) Performed: Procedure(s): RADIOLOGY WITH ANESTHESIA (N/A)  Patient Location: ICU  Anesthesia Type:General  Level of Consciousness: sedated  Airway and Oxygen Therapy: Patient remains intubated per anesthesia plan  Post-op Pain: none  Post-op Assessment: Post-op Vital signs reviewed, Patient's Cardiovascular Status Stable, Respiratory Function Stable, Patent Airway and Pain level controlled  Post-op Vital Signs: Reviewed and stable  Last Vitals:  Filed Vitals:   06/03/14 2200  BP: 146/71  Pulse:   Temp: 35 C  Resp: 18    Complications: No apparent anesthesia complications

## 2014-06-04 ENCOUNTER — Inpatient Hospital Stay (HOSPITAL_COMMUNITY): Payer: Medicare Other

## 2014-06-04 DIAGNOSIS — J9601 Acute respiratory failure with hypoxia: Secondary | ICD-10-CM

## 2014-06-04 DIAGNOSIS — I10 Essential (primary) hypertension: Secondary | ICD-10-CM

## 2014-06-04 DIAGNOSIS — G9340 Encephalopathy, unspecified: Secondary | ICD-10-CM

## 2014-06-04 DIAGNOSIS — E785 Hyperlipidemia, unspecified: Secondary | ICD-10-CM

## 2014-06-04 DIAGNOSIS — I63511 Cerebral infarction due to unspecified occlusion or stenosis of right middle cerebral artery: Secondary | ICD-10-CM

## 2014-06-04 DIAGNOSIS — I639 Cerebral infarction, unspecified: Secondary | ICD-10-CM

## 2014-06-04 LAB — ABO/RH: ABO/RH(D): B POS

## 2014-06-04 LAB — HEMOGLOBIN A1C
Hgb A1c MFr Bld: 6.2 % — ABNORMAL HIGH (ref <5.7)
MEAN PLASMA GLUCOSE: 131 mg/dL — AB (ref <117)

## 2014-06-04 LAB — CBC WITH DIFFERENTIAL/PLATELET
BASOS PCT: 0 % (ref 0–1)
Basophils Absolute: 0 10*3/uL (ref 0.0–0.1)
EOS PCT: 0 % (ref 0–5)
Eosinophils Absolute: 0 10*3/uL (ref 0.0–0.7)
HCT: 38 % (ref 36.0–46.0)
HEMOGLOBIN: 12.4 g/dL (ref 12.0–15.0)
LYMPHS ABS: 1.9 10*3/uL (ref 0.7–4.0)
Lymphocytes Relative: 12 % (ref 12–46)
MCH: 27.3 pg (ref 26.0–34.0)
MCHC: 32.6 g/dL (ref 30.0–36.0)
MCV: 83.7 fL (ref 78.0–100.0)
Monocytes Absolute: 0.9 10*3/uL (ref 0.1–1.0)
Monocytes Relative: 6 % (ref 3–12)
NEUTROS ABS: 13 10*3/uL — AB (ref 1.7–7.7)
NEUTROS PCT: 82 % — AB (ref 43–77)
Platelets: ADEQUATE 10*3/uL (ref 150–400)
RBC: 4.54 MIL/uL (ref 3.87–5.11)
RDW: 14.7 % (ref 11.5–15.5)
WBC: 15.8 10*3/uL — ABNORMAL HIGH (ref 4.0–10.5)

## 2014-06-04 LAB — POCT I-STAT 3, ART BLOOD GAS (G3+)
Acid-base deficit: 5 mmol/L — ABNORMAL HIGH (ref 0.0–2.0)
BICARBONATE: 20.4 meq/L (ref 20.0–24.0)
O2 Saturation: 100 %
PCO2 ART: 37 mmHg (ref 35.0–45.0)
PO2 ART: 256 mmHg — AB (ref 80.0–100.0)
Patient temperature: 98.6
TCO2: 21 mmol/L (ref 0–100)
pH, Arterial: 7.348 — ABNORMAL LOW (ref 7.350–7.450)

## 2014-06-04 LAB — BASIC METABOLIC PANEL
Anion gap: 12 (ref 5–15)
BUN: 17 mg/dL (ref 6–23)
CO2: 19 meq/L (ref 19–32)
CREATININE: 0.72 mg/dL (ref 0.50–1.10)
Calcium: 7.8 mg/dL — ABNORMAL LOW (ref 8.4–10.5)
Chloride: 107 mEq/L (ref 96–112)
GFR calc Af Amer: >90 mL/min (ref >90)
GFR, EST NON AFRICAN AMERICAN: 88 mL/min — AB (ref >90)
GLUCOSE: 136 mg/dL — AB (ref 70–99)
Potassium: 4.2 mEq/L (ref 3.7–5.3)
Sodium: 138 mEq/L (ref 137–147)

## 2014-06-04 LAB — LIPID PANEL
CHOL/HDL RATIO: 6.1 ratio
Cholesterol: 219 mg/dL — ABNORMAL HIGH (ref 0–200)
HDL: 36 mg/dL — ABNORMAL LOW (ref >39)
LDL Cholesterol: 150 mg/dL — ABNORMAL HIGH (ref 0–99)
TRIGLYCERIDES: 163 mg/dL — AB (ref <150)
VLDL: 33 mg/dL (ref 0–40)

## 2014-06-04 LAB — CORTISOL: CORTISOL PLASMA: 26.5 ug/dL

## 2014-06-04 LAB — GLUCOSE, CAPILLARY
GLUCOSE-CAPILLARY: 89 mg/dL (ref 70–99)
GLUCOSE-CAPILLARY: 76 mg/dL (ref 70–99)
GLUCOSE-CAPILLARY: 50 mg/dL — AB (ref 70–99)
GLUCOSE-CAPILLARY: 90 mg/dL (ref 70–99)
Glucose-Capillary: 131 mg/dL — ABNORMAL HIGH (ref 70–99)

## 2014-06-04 LAB — PHOSPHORUS: Phosphorus: 3.2 mg/dL (ref 2.3–4.6)

## 2014-06-04 LAB — TYPE AND SCREEN
ABO/RH(D): B POS
Antibody Screen: NEGATIVE

## 2014-06-04 LAB — TRIGLYCERIDES: Triglycerides: 155 mg/dL — ABNORMAL HIGH (ref <150)

## 2014-06-04 LAB — TROPONIN I

## 2014-06-04 LAB — MAGNESIUM: MAGNESIUM: 1.9 mg/dL (ref 1.5–2.5)

## 2014-06-04 LAB — APTT: aPTT: 28 seconds (ref 24–37)

## 2014-06-04 MED ORDER — VITAL HIGH PROTEIN PO LIQD
1000.0000 mL | ORAL | Status: DC
  Filled 2014-06-04 (×3): qty 1000

## 2014-06-04 MED ORDER — DEXAMETHASONE SODIUM PHOSPHATE 4 MG/ML IJ SOLN
4.0000 mg | INTRAMUSCULAR | Status: AC
  Administered 2014-06-04 – 2014-06-05 (×9): 4 mg via INTRAVENOUS
  Filled 2014-06-04 (×11): qty 1

## 2014-06-04 MED ORDER — DEXTROSE 50 % IV SOLN
25.0000 mL | Freq: Once | INTRAVENOUS | Status: AC | PRN
  Administered 2014-06-04: 25 mL via INTRAVENOUS

## 2014-06-04 MED ORDER — PRO-STAT SUGAR FREE PO LIQD
60.0000 mL | Freq: Two times a day (BID) | ORAL | Status: DC
  Filled 2014-06-04 (×3): qty 60

## 2014-06-04 MED ORDER — FENTANYL BOLUS VIA INFUSION
25.0000 ug | INTRAVENOUS | Status: DC | PRN
  Filled 2014-06-04: qty 50

## 2014-06-04 MED ORDER — CHLORHEXIDINE GLUCONATE 0.12 % MT SOLN
15.0000 mL | Freq: Two times a day (BID) | OROMUCOSAL | Status: DC
  Administered 2014-06-04 – 2014-06-05 (×3): 15 mL via OROMUCOSAL
  Filled 2014-06-04 (×3): qty 15

## 2014-06-04 MED ORDER — DEXTROSE 50 % IV SOLN
INTRAVENOUS | Status: AC
  Filled 2014-06-04: qty 50

## 2014-06-04 MED ORDER — SODIUM CHLORIDE 0.9 % IV SOLN
0.0000 ug/h | INTRAVENOUS | Status: DC
  Administered 2014-06-04: 50 ug/h via INTRAVENOUS
  Filled 2014-06-04: qty 50

## 2014-06-04 MED ORDER — CETYLPYRIDINIUM CHLORIDE 0.05 % MT LIQD
7.0000 mL | Freq: Four times a day (QID) | OROMUCOSAL | Status: DC
  Administered 2014-06-04 – 2014-06-05 (×4): 7 mL via OROMUCOSAL

## 2014-06-04 MED ORDER — FENTANYL CITRATE 0.05 MG/ML IJ SOLN: 50.0000 ug | Freq: Once | INTRAMUSCULAR | Status: AC

## 2014-06-04 MED ORDER — ASPIRIN 300 MG RE SUPP
300.0000 mg | Freq: Every day | RECTAL | Status: DC
  Administered 2014-06-04 – 2014-06-06 (×3): 300 mg via RECTAL
  Filled 2014-06-04 (×3): qty 1

## 2014-06-04 NOTE — Progress Notes (Signed)
Pt did not have cuff leak.  Dr. Hartford Poli made aware of this and he wants to hold off on extubation at this time. Will re eval this in the am.  Family made aware and RN at bedside and also aware. Increased PS to 10 for pt comfort.  Will continue PS throughout day as tolerated. R Twill continue to monitor.

## 2014-06-04 NOTE — Progress Notes (Signed)
SLP Cancellation Note  Patient Details Name: Klaira Pesci MRN: [id number] DOB: Sep 16, 1945   Cancelled treatment:        Spoke with RN this morning who indicated plan to extubate today. RT afternoon note seen, with plan to defer extubation for now. ST will follow, and continue with eval once pt extubated and demonstrates success being off ventilator.  Celia B. [place], [facility], [place]   Shonna Chock 06/04/2014, 3:07 PM

## 2014-06-04 NOTE — Progress Notes (Signed)
STROKE TEAM PROGRESS NOTE   HISTORY Mary Le is an 68 y.o. female who awakened normal today. Went to have an MRI and returned normal. She did complain of not feeling well and laid down for a nap. When her family went to awaken her they noted a left facial droop and left sided weakness. EMS was called at that time and the patient was brought in as a code stroke. Initial NIHSS of 18. Per family patient has complained of a headache for the past 3 days.  Date last known well: Date: 06/03/2014 Time last known well: Time: 13:30 tPA Given: No: Outside time window   SUBJECTIVE (INTERVAL HISTORY) No family is at the bedside.  IR nurse at bedside to remove sheath. She is still intubated and on sedation. Able to nod but hard to open eyes. Not consistent following commands.  OBJECTIVE Temp:  [95 F (35 C)-99.1 F (37.3 C)] 98.8 F (37.1 C) (11/14 1500) Pulse Rate:  [64-93] 80 (11/14 1400) Cardiac Rhythm:  [-] Normal sinus rhythm (11/14 0800) Resp:  [11-24] 12 (11/14 1500) BP: (86-187)/(38-93) 109/56 mmHg (11/14 1500) SpO2:  [95 %-100 %] 100 % (11/14 1500) Arterial Line BP: (117-196)/(43-107) 133/74 mmHg (11/14 1500) FiO2 (%):  [40 %-100 %] 40 % (11/14 1258) Weight:  [221 lb 1.9 oz (100.3 kg)-229 lb 4.5 oz (104 kg)] 229 lb 4.5 oz (104 kg) (11/14 0530)   Recent Labs Lab 06/03/14 1808 06/04/14 0810 06/04/14 1123 06/04/14 1126  GLUCAP 103* 89 39* 76    Recent Labs Lab 06/03/14 1727 06/03/14 1735 06/04/14 0510  NA 134* 136* 138  K 7.8* 5.6* 4.2  CL 109 101 107  CO2  --  19 19  GLUCOSE 120* 136* 136*  BUN 42* 26* 17  CREATININE 1.10 1.16* 0.72  CALCIUM  --  9.5 7.8*  MG  --   --  1.9  PHOS  --   --  3.2    Recent Labs Lab 06/03/14 1735  AST 16  ALT 12  ALKPHOS 54  BILITOT 0.3  PROT 7.3  ALBUMIN 3.4*    Recent Labs Lab 06/03/14 1715 06/03/14 1727 06/04/14 0510  WBC 18.1*  --  15.8*  NEUTROABS 14.9*  --  13.0*  HGB 16.1* 18.0* 12.4  HCT 48.2* 53.0* 38.0   MCV 83.7  --  83.7  PLT 219  --  PLATELET CLUMPS NOTED ON SMEAR, COUNT APPEARS ADEQUATE    Recent Labs Lab 06/04/14 0030  TROPONINI <0.30    Recent Labs  06/03/14 1735  LABPROT 13.7  INR 1.04    Recent Labs  06/03/14 1842  COLORURINE YELLOW  LABSPEC 1.035*  PHURINE 5.5  GLUCOSEU NEGATIVE  HGBUR NEGATIVE  BILIRUBINUR SMALL*  KETONESUR NEGATIVE  PROTEINUR 30*  UROBILINOGEN 0.2  NITRITE NEGATIVE  LEUKOCYTESUR NEGATIVE       Component Value Date/Time   CHOL 219* 06/04/2014 0510   TRIG 163* 06/04/2014 0510   HDL 36* 06/04/2014 0510   CHOLHDL 6.1 06/04/2014 0510   VLDL 33 06/04/2014 0510   LDLCALC 150* 06/04/2014 0510   No results found for: HGBA1C    Component Value Date/Time   LABOPIA NONE DETECTED 06/03/2014 1842   COCAINSCRNUR NONE DETECTED 06/03/2014 1842   LABBENZ NONE DETECTED 06/03/2014 1842   AMPHETMU NONE DETECTED 06/03/2014 1842   THCU POSITIVE* 06/03/2014 1842   LABBARB NONE DETECTED 06/03/2014 1842     Recent Labs Lab 06/03/14 1828  ETH <11    Ct Angio Head and  neck W/cm &/or Wo Cm  06/03/2014    IMPRESSION: 1. Distal right M1 occlusion. 2. Mild carotid siphon calcification without significant stenosis. 3. No cervical carotid stenosis. Question bilateral cervical ICA fibromuscular dysplasia. 4. Possible developing low density in the right temporal lobe, parietal lobe, and insula, which may reflect acute infarct.    Ct Head Wo Contrast  06/03/2014   IMPRESSION: Contrast staining within the RIGHT frontal lobe consistent with recent DSA, without CT findings of hemorrhage or, definite propagation of known RIGHT middle cerebral artery infarct.  Mildly hypodense mesial LEFT temporal lobe likely artifact though, MRI could be performed if there are referable symptoms.      06/03/2014   IMPRESSION: No acute abnormality noted. 06/03/2014 17:35   Portable Chest Xray  06/03/2014  IMPRESSION: 1. Endotracheal tube with the tip 4.2 cm above the carina.  2. Left lower lobe airspace disease which may reflect atelectasis versus developing pneumonia.       LE venous doppler - No obvious evidence of DVT, superficial thrombosis, or Baker's cyst. Right common femoral vein not visualized due to bandages.  2D echo - pending  PHYSICAL EXAM Physical exam  Temp:  [95 F (35 C)-99.1 F (37.3 C)] 98.8 F (37.1 C) (11/14 1500) Pulse Rate:  [64-93] 80 (11/14 1400) Resp:  [11-24] 12 (11/14 1500) BP: (86-187)/(38-93) 109/56 mmHg (11/14 1500) SpO2:  [95 %-100 %] 100 % (11/14 1500) Arterial Line BP: (117-196)/(43-107) 133/74 mmHg (11/14 1500) FiO2 (%):  [40 %-100 %] 40 % (11/14 1258) Weight:  [221 lb 1.9 oz (100.3 kg)-229 lb 4.5 oz (104 kg)] 229 lb 4.5 oz (104 kg) (11/14 0530)  General - Well nourished, well developed, intubated.  Ophthalmologic - not cooperate on exam  Cardiovascular - Regular rate and rhythm with no murmur.  Neuro - intubated and on sedation, nod on voice but not open eyes. Pupils 23mm, sluggish to light, doll's eyes present, positive corneal and gag. Left UE 0/5, left LE trace withdraw to pain, RUE and RLE spontaneous movement. Reflex 1+ and no babinski.   ASSESSMENT/PLAN [name] is a 68 y.o. female with history of HTN, HLD presenting with left hemiplegia, left neglect and HH, right gaze. She did not receive IV t-PA due to out of window. CTA showed right M1 cut off and IR performed thrombectomy and revascularization of right M1.    Stroke:  Non-dominant right MCA infarct embolic secondary to unknown etiology  Resultant left hemiparesis  MRI  pending  MRA  pending  Carotid Doppler  pending  2D Echo  Pending  LE DVT negative  LDL 150, not at the goal < 70  HgbA1c pending  Heparin subq for VTE prophylaxis  Diet NPO for now   no antithrombotics prior to admission, now on ASA 300 supporsitory  Ongoing aggressive risk factor management  Therapy recommendations:  pending  Disposition:   Pending  Respiratory failure - currently intubated - try to wean off vent  - extubate today if possible - vent per CCM  Hypertension  Home meds:   Lisinopril and metoprolol  Stable Permissive hypertension (OK if <220/120) for 24-48 hours post stroke and then gradually normalized within 5-7 days.  Hyperlipidemia  Home meds:  lovaza  LDL 150, not at goal < 70  No po access now  Allergy to statin  May try pravastatin or fenofibrate when po access established.  Diabetes  HgbA1c pending goal < 7.0  SSI for now  Other Stroke Risk Factors  Advanced age  Hospital day # 1  This patient is critically ill due to right MCA stroke and respiratory failure and at significant risk of neurological worsening, death form large stroke, hemorrhagic transformation, brain herniation. This patient's care requires constant monitoring of vital signs, hemodynamics, respiratory and cardiac monitoring, review of multiple databases, neurological assessment, discussion with family, other specialists and medical decision making of high complexity. I spent 40 minutes of neurocritical care time in the care of this patient.  Rosalin Hawking, MD PhD Stroke Neurology 06/04/2014 5:21 PM     To contact Stroke Continuity provider, please refer to [email or web address] After hours, contact General Neurology

## 2014-06-04 NOTE — Progress Notes (Signed)
Dr. Armida Sans notified that patient was able to move left leg to command but very weak after returning from IR and that patient indicates she has no sensation and is only w/d to pain on the left leg now. MD also notified that her pressure has been difficult to control 2/2 patient's agitation and sedation/analgesic needs. Will monitor.

## 2014-06-04 NOTE — Progress Notes (Signed)
PULMONARY  / [place]   Name: Mary Le MRN: [id number] DOB: 1945/10/08    ADMISSION DATE:  06/03/2014 CONSULTATION DATE: June 04, 2014  REQUESTING CLINICIAN: Alexis Goodell, MD  PRIMARY SERVICE: Neurology  CHIEF COMPLAINT:  Left sided weakness  BRIEF PATIENT DESCRIPTION: 68 y/o woman w/ hx of HTN, HLD who presents w/ L hemiparasis and R MCA stroke, last known well at 13:30 on 11/13.  SIGNIFICANT EVENTS / STUDIES:  R MCA clot retrieval, 06/03/14  LINES / TUBES: 7.73mm ETT placed 11/13 > L rad A-line placed 11/13 > PIV x2 placed 11/13 Foley placed 11/13  CULTURES: None  ANTIBIOTICS: Ancef x1 dose, 11/13   SUBJECTIVE:  Following commands, reached for tube overnight, CVL removed  VITAL SIGNS: Temp:  [95 F (35 C)-99.1 F (37.3 C)] 98.8 F (37.1 C) (11/14 1030) Pulse Rate:  [64-93] 81 (11/14 0919) Resp:  [11-24] 17 (11/14 1030) BP: (86-187)/(38-93) 120/61 mmHg (11/14 1030) SpO2:  [95 %-100 %] 100 % (11/14 0919) Arterial Line BP: (117-196)/(43-107) 179/74 mmHg (11/14 1030) FiO2 (%):  [40 %-100 %] 40 % (11/14 0919) Weight:  [97.523 kg (215 lb)-104 kg (229 lb 4.5 oz)] 104 kg (229 lb 4.5 oz) (11/14 0530) HEMODYNAMICS:   VENTILATOR SETTINGS: Vent Mode:  [-] PRVC FiO2 (%):  [40 %-100 %] 40 % Set Rate:  [15 bmp-16 bmp] 15 bmp Vt Set:  [450 mL-500 mL] 500 mL PEEP:  [5 cmH20] 5 cmH20 Plateau Pressure:  [11 cmH20-17 cmH20] 15 cmH20 INTAKE / OUTPUT: Intake/Output      11/13 0701 - 11/14 0700 11/14 0701 - 11/15 0700   I.V. (mL/kg) 1691 (16.3) 853.9 (8.2)   Total Intake(mL/kg) 1691 (16.3) 853.9 (8.2)   Urine (mL/kg/hr) 1125 850 (2)   Total Output 1125 850   Net +566 +3.9          PHYSICAL EXAMINATION:  Gen: sedated on vent HEENT: NCAT, ETT in place PULM: CTA B CV: RRR, no mgr, no JVD AB: BS+, soft, nontender Ext: warm, no edema, no clubbing, no cyanosis Derm: no rash or skin breakdown Neuro: A&Ox4,  MAEW   LABS:  CBC  Recent Labs Lab 06/03/14 1715 06/03/14 1727 06/04/14 0510  WBC 18.1*  --  15.8*  HGB 16.1* 18.0* 12.4  HCT 48.2* 53.0* 38.0  PLT 219  --  PLATELET CLUMPS NOTED ON SMEAR, COUNT APPEARS ADEQUATE   Coag's  Recent Labs Lab 06/03/14 1735 06/04/14 0510  APTT 26 28  INR 1.04  --    BMET  Recent Labs Lab 06/03/14 1727 06/03/14 1735 06/04/14 0510  NA 134* 136* 138  K 7.8* 5.6* 4.2  CL 109 101 107  CO2  --  19 19  BUN 42* 26* 17  CREATININE 1.10 1.16* 0.72  GLUCOSE 120* 136* 136*   Electrolytes  Recent Labs Lab 06/03/14 1735 06/04/14 0510  CALCIUM 9.5 7.8*  MG  --  1.9  PHOS  --  3.2   Sepsis Markers No results for input(s): LATICACIDVEN, PROCALCITON, O2SATVEN in the last 168 hours. ABG  Recent Labs Lab 06/04/14 0052  PHART 7.348*  PCO2ART 37.0  PO2ART 256.0*   Liver Enzymes  Recent Labs Lab 06/03/14 1735  AST 16  ALT 12  ALKPHOS 54  BILITOT 0.3  ALBUMIN 3.4*   Cardiac Enzymes  Recent Labs Lab 06/04/14 0030  TROPONINI <0.30   Glucose  Recent Labs Lab 06/03/14 1808 06/04/14 0810  GLUCAP 103* 89    Imaging Ct Angio Head W/cm &/  or Wo Cm  06/03/2014   CLINICAL DATA:  Code stroke. Left-sided weakness and slurred speech.  EXAM: CT ANGIOGRAPHY HEAD AND NECK  TECHNIQUE: Multidetector CT imaging of the head and neck was performed using the standard protocol during bolus administration of intravenous contrast. Multiplanar CT image reconstructions and MIPs were obtained to evaluate the vascular anatomy. Carotid stenosis measurements (when applicable) are obtained utilizing NASCET criteria, using the distal internal carotid diameter as the denominator.  CONTRAST:  14mL OMNIPAQUE IOHEXOL 350 MG/ML SOLN  COMPARISON:  Head CT 06/03/2014  FINDINGS: CTA HEAD FINDINGS  On postcontrast head CT images, there is question of developing hypoattenuation in the right temporal lobe, right insula, and possibly right parietal lobe. Ventricles  and sulci are within normal limits. No acute intracranial hemorrhage, mass, midline shift, or extra-axial fluid collection is seen. There is no abnormal enhancement. Orbits are unremarkable. Mastoid air cells and paranasal sinuses are clear.  Internal carotid arteries are patent from skullbase to carotid termini. There is mild bilateral carotid siphon calcification without evidence of significant stenosis. There is occlusion of the distal right M1 segment. Only minimal flow is present in some distal right MCA branches. Left MCA is unremarkable. ACAs are unremarkable.  Intracranial vertebral arteries are patent to the basilar. PICA origins are patent. AICAs and SCAs are not clearly identified. Basilar artery is small in caliber diffusely, likely on a congenital basis, without focal stenosis. There are patent posterior communicating arteries bilaterally. No PCA stenosis is identified. No intracranial aneurysm is identified.  Review of the MIP images confirms the above findings.  CTA NECK FINDINGS  Three vessel aortic arch. The brachiocephalic and subclavian arteries are unremarkable. Common carotid and cervical internal carotid arteries are patent without stenosis. Proximal left ICA is tortuous. There is suggestion of a slightly beaded appearance to the mid and distal cervical ICAs, however evaluation is mildly limited by motion artifact through this region. Vertebral arteries are patent and codominant without evidence of stenosis.  Subsegmental atelectasis is present dependently in the visualized lungs. Subcentimeter nodule is incidentally noted in the left thyroid lobe. Mild multilevel cervical spondylosis is noted.  Review of the MIP images confirms the above findings.  IMPRESSION: 1. Distal right M1 occlusion. 2. Mild carotid siphon calcification without significant stenosis. 3. No cervical carotid stenosis. Question bilateral cervical ICA fibromuscular dysplasia. 4. Possible developing low density in the right  temporal lobe, parietal lobe, and insula, which may reflect acute infarct.  Critical Value/emergent results (preliminary finding of right M1 occlusion) were called by telephone at the time of interpretation on 06/03/2014 at 6:15 pm to Dr. Alexis Goodell , who verbally acknowledged these results.   Electronically Signed   By: Logan Bores   On: 06/03/2014 18:35   Ct Head Wo Contrast  06/03/2014   CLINICAL DATA:  Stroke, status post interventional radiology procedure. LEFT middle cerebral artery occlusion.  EXAM: CT HEAD WITHOUT CONTRAST  TECHNIQUE: Contiguous axial images were obtained from the base of the skull through the vertex without intravenous contrast.  COMPARISON:  CT of the head June 03, 2014 at 1751 hr  FINDINGS: Persistent blurring of the gray-white matter differentiation of the RIGHT temporal lobe though, somewhat improved from prior examination. Somewhat hypodense LEFT mesial temporal lobe, which could be artifact. Mildly dense intracranial vessels consistent with recent digital subtraction angiography. Minimal density within the RIGHT posterior frontal lobe most consistent with contrast staining corresponding to known RIGHT middle cerebral artery territory infarct. No hemorrhage, midline shift. The ventricles  and sulci are overall normal for patient's age. Mild white matter changes suggest chronic small vessel ischemic disease. No abnormal extra-axial fluid collections. Basal cisterns are patent. Moderate calcific atherosclerosis of the carotid bulbs.  Ocular globes and orbital contents are unremarkable. Paranasal sinuses and mastoid air cells appear well-aerated. No skull fracture.  IMPRESSION: Contrast staining within the RIGHT frontal lobe consistent with recent DSA, without CT findings of hemorrhage or, definite propagation of known RIGHT middle cerebral artery infarct.  Mildly hypodense mesial LEFT temporal lobe likely artifact though, MRI could be performed if there are referable  symptoms.   Electronically Signed   By: Elon Alas   On: 06/03/2014 21:48   Ct Head Wo Contrast  06/03/2014   CLINICAL DATA:  Left-sided facial  EXAM: CT HEAD WITHOUT CONTRAST  TECHNIQUE: Contiguous axial images were obtained from the base of the skull through the vertex without intravenous contrast.  COMPARISON:  None.  FINDINGS: The bony calvarium is intact. No gross soft tissue abnormality is noted. No findings to suggest acute hemorrhage, acute infarction or space-occupying mass lesion are noted.  IMPRESSION: No acute abnormality noted. These results were called by telephone at the time of interpretation on 06/03/2014 at 5:35 pm to Dr. Doy Mince, who verbally acknowledged these results.   Electronically Signed   By: Inez Catalina M.D.   On: 06/03/2014 17:35   Ct Angio Neck W/cm &/or Wo/cm  06/03/2014   CLINICAL DATA:  Code stroke. Left-sided weakness and slurred speech.  EXAM: CT ANGIOGRAPHY HEAD AND NECK  TECHNIQUE: Multidetector CT imaging of the head and neck was performed using the standard protocol during bolus administration of intravenous contrast. Multiplanar CT image reconstructions and MIPs were obtained to evaluate the vascular anatomy. Carotid stenosis measurements (when applicable) are obtained utilizing NASCET criteria, using the distal internal carotid diameter as the denominator.  CONTRAST:  37mL OMNIPAQUE IOHEXOL 350 MG/ML SOLN  COMPARISON:  Head CT 06/03/2014  FINDINGS: CTA HEAD FINDINGS  On postcontrast head CT images, there is question of developing hypoattenuation in the right temporal lobe, right insula, and possibly right parietal lobe. Ventricles and sulci are within normal limits. No acute intracranial hemorrhage, mass, midline shift, or extra-axial fluid collection is seen. There is no abnormal enhancement. Orbits are unremarkable. Mastoid air cells and paranasal sinuses are clear.  Internal carotid arteries are patent from skullbase to carotid termini. There is mild  bilateral carotid siphon calcification without evidence of significant stenosis. There is occlusion of the distal right M1 segment. Only minimal flow is present in some distal right MCA branches. Left MCA is unremarkable. ACAs are unremarkable.  Intracranial vertebral arteries are patent to the basilar. PICA origins are patent. AICAs and SCAs are not clearly identified. Basilar artery is small in caliber diffusely, likely on a congenital basis, without focal stenosis. There are patent posterior communicating arteries bilaterally. No PCA stenosis is identified. No intracranial aneurysm is identified.  Review of the MIP images confirms the above findings.  CTA NECK FINDINGS  Three vessel aortic arch. The brachiocephalic and subclavian arteries are unremarkable. Common carotid and cervical internal carotid arteries are patent without stenosis. Proximal left ICA is tortuous. There is suggestion of a slightly beaded appearance to the mid and distal cervical ICAs, however evaluation is mildly limited by motion artifact through this region. Vertebral arteries are patent and codominant without evidence of stenosis.  Subsegmental atelectasis is present dependently in the visualized lungs. Subcentimeter nodule is incidentally noted in the left thyroid lobe. Mild multilevel  cervical spondylosis is noted.  Review of the MIP images confirms the above findings.  IMPRESSION: 1. Distal right M1 occlusion. 2. Mild carotid siphon calcification without significant stenosis. 3. No cervical carotid stenosis. Question bilateral cervical ICA fibromuscular dysplasia. 4. Possible developing low density in the right temporal lobe, parietal lobe, and insula, which may reflect acute infarct.  Critical Value/emergent results (preliminary finding of right M1 occlusion) were called by telephone at the time of interpretation on 06/03/2014 at 6:15 pm to Dr. Alexis Goodell , who verbally acknowledged these results.   Electronically Signed   By:  Logan Bores   On: 06/03/2014 18:35   Mr Lumbar Spine Wo Contrast  06/03/2014   CLINICAL DATA:  Low back pain radiating into the anterior right thigh with numbness for 4 months. No acute injury or prior relevant surgery. Initial encounter.  EXAM: MRI LUMBAR SPINE WITHOUT CONTRAST  TECHNIQUE: Multiplanar, multisequence MR imaging of the lumbar spine was performed. No intravenous contrast was administered.  COMPARISON:  None.  FINDINGS: Five lumbar type vertebral bodies are assumed. The alignment is normal. There is no evidence of fracture or pars defect. The lumbar pedicles are somewhat short on a congenital basis.  The conus medullaris extends to the L1 level and appears normal. No paraspinal abnormalities are identified.There are probable left renal parapelvic cysts, incompletely visualized.  No significant disc space findings are present at T12-L1 or L1-2.  L2-3: Annular disc bulging eccentric to the left with moderate facet and ligamentous hypertrophy. There are small bilateral facet joint effusions. There is triangulation of the thecal sac with minimal asymmetric narrowing of the left lateral recess and left foramen. No nerve root encroachment identified.  L3-4: Disc height and hydration are maintained. There is mild disc bulging with moderate facet and ligamentous hypertrophy. There are bilateral facet joint effusions. There is borderline spinal stenosis. The lateral recesses and foramina are patent.  L4-5: There is annular disc bulging with a shallow central disc protrusion. Moderately advanced facet degenerative changes are asymmetric to the right. There is mild spinal stenosis with left greater than right lateral recess narrowing. The foramina are sufficiently patent.  L5-S1: Disc height and hydration are maintained. There is mild bilateral facet hypertrophy. No spinal stenosis or nerve root encroachment.  IMPRESSION: 1. No explanation for the patient's symptoms identified. No evidence of right-sided  nerve root encroachment. 2. Shallow central disc protrusion at L4-5 with moderate bilateral facet hypertrophy contributing to mild spinal stenosis and left greater than right lateral recess narrowing. 3. Borderline spinal stenosis and moderate facet hypertrophy at L2-3 and L3-4.   Electronically Signed   By: Camie Patience M.D.   On: 06/03/2014 15:26   Portable Chest Xray  06/03/2014   CLINICAL DATA:  Acute respiratory failure  EXAM: PORTABLE CHEST - 1 VIEW  COMPARISON:  04/12/2014  FINDINGS: There is a endotracheal tube with the tip 4.2 cm above the carina. There is elevation of the right diaphragm. There is left lower lobe airspace disease likely reflecting atelectasis versus pneumonia. There is no pleural effusion or pneumothorax. The heart and mediastinal contours are unremarkable.  The osseous structures are unremarkable.  IMPRESSION: 1. Endotracheal tube with the tip 4.2 cm above the carina. 2. Left lower lobe airspace disease which may reflect atelectasis versus developing pneumonia.   Electronically Signed   By: Kathreen Devoid   On: 06/03/2014 23:36    EKG: NSR CXR: ETT in place, atelectasis in bases  ASSESSMENT / PLAN: NEUROLOGIC A: R MCA  Stroke, s/p mechanical clot removal as out of window for tpa Sedation for mechanical ventilation P:   Defer antiplatelet strategy/secondary stroke prevention per neurology. Wean sedation to off, then extubate  PULMONARY A: Acute respiratory failure due to inability to protect airway P:   Full support for now until 4 hours post sheath removal SBT/likely extubation today  CARDIOVASCULAR A: Hx of HTN P:   Maintain SBP > 120-140 per IR recs given arterial access in stroke w/ intervention by VIR  RENAL A: AKI on CKD > resolved Hyperkalemia > resolved P:   Monitor BMET and UOP Replace electrolytes as needed  GASTROINTESTINAL A: No active issues P: SLP eval post extubation  HEMATOLOGIC A: Polycythemia P:   May be lab error, or  possible hx of chronic hypoxia.  INFECTIOUS A: No active issues. P:    ENDOCRINE A: Hx of HLD P:   Will need statin in setting of acute stroke, defer to neurology for this.  TODAY'S SUMMARY: 68 y/o woman with risk factors of HTN and HLD who presented 11/13 with acute stroke s/p mechanical thrombus removal. Following commands, likely able to extubated today  I have personally obtained a history, examined the patient, evaluated laboratory and imaging results, formulated the assessment and plan and placed orders.  CRITICAL CARE: The patient is critically ill with multiple organ systems failure and requires high complexity decision making for assessment and support, frequent evaluation and titration of therapies, application of advanced monitoring technologies and extensive interpretation of multiple databases. Critical Care Time devoted to patient care services described in this note is 45 minutes.   Roselie Awkward, MD [place] [facility] Pager: [phone] Cell: [phone] If no response, call [phone]

## 2014-06-04 NOTE — Progress Notes (Signed)
INITIAL NUTRITION ASSESSMENT  DOCUMENTATION CODES Per approved criteria  -Obesity Unspecified   INTERVENTION: If unable to wean pt as anticipated: Initiate Vital HP @ 20 ml/hr via OGT and increase by 10 ml every 4 hours to goal rate of 40 ml/hr.   Add 30 ml Prostat QID.    Tube feeding regimen provides 1360  kcal (100% of minimum est energy needs and 100% est protein needs), 143 grams of protein, and 803 ml of H2O.   NUTRITION DIAGNOSIS: Inadequate oral intake related to inability to eat  as evidenced by NPO status.   Goal: Enteral nutrition to provide 60-70% of estimated calorie needs (22-25 kcals/kg ideal body weight) and 100% of estimated protein needs, based on ASPEN guidelines for hypocaloric, high protein feeding in critically ill obese individuals     Monitor: Respiratory status, nutrition support, transiton to oral intake as appropriate, labs and wt trends    Reason for Assessment: pt is mechanically ventilated  68 y.o. female   ASSESSMENT: 68 y/o woman w/ hx of HTN, HLD who presents w/ L hemiparasis and R MCA stroke, last known well at 13:30 on 11/13. Pt is s/p MCA stroke Bilateral lower extremity venous duplex (clot removal). RFA sheath has been removed she remains intubated, sedated. Respiratory to reassess for wean later this afternoon.   MV: 7.6  L/min Temp (24hrs), Avg:97.9 F (36.6 C), Min:95 F (35 C), Max:99.1 F (37.3 C)  Propofol: 0 ml/hr . Pt is receiving Fentanyl.  Height: Ht Readings from Last 1 Encounters:  06/03/14 5\' 7"  (1.702 m)    Weight: Wt Readings from Last 1 Encounters:  06/04/14 229 lb 4.5 oz (104 kg)    Ideal Body Weight: 135# (61.3 kg)  % Ideal Body Weight: 170%  Wt Readings from Last 10 Encounters:  06/04/14 229 lb 4.5 oz (104 kg)  06/03/14 215 lb (97.523 kg)  04/12/14 219 lb (99.338 kg)  02/07/14 219 lb 6.4 oz (99.519 kg)  01/03/14 220 lb (99.791 kg)  07/26/13 219 lb (99.338 kg)  01/04/13 214 lb 6.4 oz (97.251 kg)   11/23/12 216 lb 9.6 oz (98.249 kg)  12/07/11 224 lb 6.4 oz (101.787 kg)    Usual Body Weight: 215-225#  % Usual Body Weight: 107%  BMI:  Body mass index is 35.9 kg/(m^2). obesity class II  Estimated Nutritional Needs: Kcal: 1660 (permissive underfeeding energy goal: 1342-1464 kcal) Protein:122-140 gr Fluid: 1.7 liters daily   Skin: Bilateral lower extremity venous duplex completed  Diet Order: Diet NPO time specified  EDUCATION NEEDS: -No education needs identified at this time   Intake/Output Summary (Last 24 hours) at 06/04/14 0942 Last data filed at 06/04/14 0900  Gross per 24 hour  Intake 2452.12 ml  Output   1445 ml  Net 1007.12 ml    Last BM:  PTA  Labs:   Recent Labs Lab 06/03/14 1727 06/03/14 1735 06/04/14 0510  NA 134* 136* 138  K 7.8* 5.6* 4.2  CL 109 101 107  CO2  --  19 19  BUN 42* 26* 17  CREATININE 1.10 1.16* 0.72  CALCIUM  --  9.5 7.8*  MG  --   --  1.9  PHOS  --   --  3.2  GLUCOSE 120* 136* 136*    CBG (last 3)   Recent Labs  06/03/14 1808 06/04/14 0810  GLUCAP 103* 89    Scheduled Meds: . antiseptic oral rinse  7 mL Mouth Rinse QID  . aspirin  300 mg Rectal  Daily  . chlorhexidine  15 mL Mouth Rinse BID  . heparin  5,000 Units Subcutaneous 3 times per day  . insulin aspart  0-9 Units Subcutaneous TID WC  . pantoprazole (PROTONIX) IV  40 mg Intravenous Daily    Continuous Infusions: . sodium chloride 75 mL/hr at 06/03/14 2301  . sodium chloride    . fentaNYL infusion INTRAVENOUS 25 mcg/hr (06/04/14 9604)  . niCARDipine Stopped (06/04/14 0248)  . propofol 25 mcg/kg/min (06/04/14 5409)    Past Medical History  Diagnosis Date  . Hypertension   . Hypercholesteremia   . Back pain     arthritis    Past Surgical History  Procedure Laterality Date  . Appendectomy    . Tonsillectomy    . Hemorroidectomy    . Tubal ligation      Colman Cater MS,RD,CSG,LDN Office: [phone] Pager: [phone]

## 2014-06-04 NOTE — Progress Notes (Signed)
9 fr RFA sheath removed at 800am by Juliet King-Cushman RT R. V Pad and manual pressure applied.  Distal pulses detected by doppler prior to removal.  Sheath is kinked in the mid portion upon inspection once removed.  Hemostasis obtained at 0830 .Groin site reviewed with RN.  Pressure dressing applied.

## 2014-06-04 NOTE — Progress Notes (Signed)
Pt placed back on full support at this time. Scheduled to go to MRI at 1730. Will transport on full support. R Twill monitor.

## 2014-06-04 NOTE — Plan of Care (Signed)
Problem: Consults Goal: Skin Care Protocol Initiated - if Braden Score 18 or less If consults are not indicated, leave blank or document N/A Outcome: Completed/Met Date Met:  06/04/14 Goal: Tobacco Cessation referral if indicated Outcome: Not Applicable Date Met:  20/25/42 Goal: Nutrition Consult-if indicated Outcome: Not Applicable Date Met:  70/62/37 Goal: Diabetes Guidelines if Diabetic/Glucose > 140 If diabetic or lab glucose is > 140 mg/dl - Initiate Diabetes/Hyperglycemia Guidelines & Document Interventions  Outcome: Not Applicable Date Met:  62/83/15  Problem: Phase I Progression Outcomes Goal: Distal pulses equal to baseline Outcome: Completed/Met Date Met:  06/04/14 Goal: Vascular site scale level 0 - I Vascular Site Scale Level 0: No bruising/bleeding/hematoma Level I (Mild): Bruising/Ecchymosis, minimal bleeding/ooozing, palpable hematoma < 3 cm Level II (Moderate): Bleeding not affecting hemodynamic parameters, pseudoaneurysm, palpable hematoma > 3 cm Level III (Severe) Bleeding which affects hemodynamic parameters or retroperitoneal hemorrhage  Outcome: Completed/Met Date Met:  06/04/14 Goal: Pain controlled with appropriate interventions Outcome: Completed/Met Date Met:  06/04/14 Fentanyl drip ordered for patient comfort and to maintain RASS of -1

## 2014-06-04 NOTE — Progress Notes (Signed)
Pt placed on PSV at this time tolerating well. Will continue to monitor and plan is to extubate if pt tolerates 5/5 X80mins.

## 2014-06-04 NOTE — Progress Notes (Signed)
PT Cancellation Note/ discharge  Patient Details Name: Mary Le MRN: [id number] DOB: 1946-03-03   Cancelled Treatment:    Reason Eval/Treat Not Completed: Medical issues which prohibited therapy (pt ordered on admission now on vent with medical decline. Will need new order to initiate therapy)   Lanetta Inch [facility] 06/04/2014, 7:09 AM Elwyn Reach, [place]

## 2014-06-04 NOTE — Progress Notes (Signed)
NUTRITION FOLLOW UP  DOCUMENTATION CODES Per approved criteria  -Obesity Unspecified   INTERVENTION: Continue advancing Vital HP @ 20 ml/hr via OGT and increase by 10 ml every 4 hours to goal rate of 40 ml/hr.   Provide 60 ml Prostat BID per tube.  Tube feeding regimen provides 1360  kcal (100% of minimum est energy needs and 100% est protein needs), 143 grams of protein, and 803 ml of H2O.   NUTRITION DIAGNOSIS: Inadequate oral intake related to inability to eat  as evidenced by NPO status; ongoing  Goal: Enteral nutrition to provide 60-70% of estimated calorie needs (22-25 kcals/kg ideal body weight) and 100% of estimated protein needs, based on ASPEN guidelines for hypocaloric, high protein feeding in critically ill obese individuals; ongoing  Monitor: Respiratory status, nutrition support, transiton to oral intake as appropriate, labs and wt trends   68 y.o. female  ASSESSMENT: 68 y/o woman w/ hx of HTN, HLD who presents w/ L hemiparasis and R MCA stroke, last known well at 13:30 on 11/13. Pt is s/p MCA stroke Bilateral lower extremity venous duplex (clot removal). RFA sheath has been removed she remains intubated, sedated. Respiratory to reassess for wean later this afternoon.   MV: 7.6  L/min Temp (24hrs), Avg:98 F (36.7 C), Min:95 F (35 C), Max:99.1 F (37.3 C)  Propofol: 0 ml/hr . Pt is receiving Fentanyl.  Previous plans were to extubate this afternoon, however MD reports holding off on extubation at this time. RD consulted for enteral/tube feeding initiation and management.  Height: Ht Readings from Last 1 Encounters:  06/03/14 5\' 7"  (1.702 m)    Weight: Wt Readings from Last 1 Encounters:  06/04/14 229 lb 4.5 oz (104 kg)    BMI:  Body mass index is 35.9 kg/(m^2). obesity class II  Re-Estimated Nutritional Needs: Kcal: 1660 (permissive underfeeding energy goal: 1342-1464 kcal) Protein:122-140 gr Fluid: 1.7 liters daily   Skin: Bilateral lower extremity  venous duplex completed  Diet Order: Diet NPO time specified   Intake/Output Summary (Last 24 hours) at 06/04/14 1419 Last data filed at 06/04/14 1400  Gross per 24 hour  Intake 2718.42 ml  Output   2550 ml  Net 168.42 ml    Last BM:  PTA  Labs:   Recent Labs Lab 06/03/14 1727 06/03/14 1735 06/04/14 0510  NA 134* 136* 138  K 7.8* 5.6* 4.2  CL 109 101 107  CO2  --  19 19  BUN 42* 26* 17  CREATININE 1.10 1.16* 0.72  CALCIUM  --  9.5 7.8*  MG  --   --  1.9  PHOS  --   --  3.2  GLUCOSE 120* 136* 136*    CBG (last 3)   Recent Labs  06/04/14 0810 06/04/14 1123 06/04/14 1126  GLUCAP 89 39* 76    Scheduled Meds: . antiseptic oral rinse  7 mL Mouth Rinse QID  . aspirin  300 mg Rectal Daily  . chlorhexidine  15 mL Mouth Rinse BID  . dexamethasone  4 mg Intravenous 6 times per day  . feeding supplement (VITAL HIGH PROTEIN)  1,000 mL Per Tube Q24H  . heparin  5,000 Units Subcutaneous 3 times per day  . insulin aspart  0-9 Units Subcutaneous TID WC  . pantoprazole (PROTONIX) IV  40 mg Intravenous Daily    Continuous Infusions: . sodium chloride 75 mL/hr at 06/04/14 1200  . sodium chloride    . fentaNYL infusion INTRAVENOUS 25 mcg/hr (06/04/14 1257)  . niCARDipine  Stopped (06/04/14 1257)  . propofol 30 mcg/kg/min (06/04/14 1400)    Past Medical History  Diagnosis Date  . Hypertension   . Hypercholesteremia   . Back pain     arthritis    Past Surgical History  Procedure Laterality Date  . Appendectomy    . Tonsillectomy    . Hemorroidectomy    . Tubal ligation      Kallie Locks, MS, RD, LDN Pager # [phone] After hours/ weekend pager # [phone]

## 2014-06-04 NOTE — Progress Notes (Signed)
Bilateral lower extremity venous duplex completed:  No obvious evidence of DVT, superficial thrombosis, or Baker's cyst.  Right common femoral vein not visualized due to bandages.  Technically difficult study due to the patient's body habitus and movements.

## 2014-06-04 NOTE — Progress Notes (Signed)
Pt remains on full support. Spoke with Dr. Hartford Poli- plan is to place pt on SBT trial when pt is able to sit up. Plan is to extubate pt today if able. RT will monitor.

## 2014-06-04 NOTE — Progress Notes (Signed)
Pt remains on full support tolerating well. Sheath pulled this am and pt has to lay flat X4hrs. Will re eval pt after 4hrs for vent wean.  RT will continue to monitor.

## 2014-06-04 NOTE — Progress Notes (Signed)
OT Cancellation Note  Patient Details Name: Mary Le MRN: [id number] DOB: October 01, 1945   Cancelled Treatment:    Reason Eval/Treat Not Completed: Medical issues which prohibited therapy (pt ordered on admission now on vent with medical decline. Will need new order to initiate therapy)   Almon Register [id number] 06/04/2014, 7:11 AM

## 2014-06-04 NOTE — Progress Notes (Signed)
Advanced ETT 1cm per Dr. Ancil Linsey. Placed commercial secured device.

## 2014-06-05 DIAGNOSIS — I63411 Cerebral infarction due to embolism of right middle cerebral artery: Secondary | ICD-10-CM

## 2014-06-05 DIAGNOSIS — J9601 Acute respiratory failure with hypoxia: Secondary | ICD-10-CM | POA: Diagnosis present

## 2014-06-05 DIAGNOSIS — I059 Rheumatic mitral valve disease, unspecified: Secondary | ICD-10-CM

## 2014-06-05 LAB — GLUCOSE, CAPILLARY
GLUCOSE-CAPILLARY: 117 mg/dL — AB (ref 70–99)
GLUCOSE-CAPILLARY: 115 mg/dL — AB (ref 70–99)
Glucose-Capillary: 125 mg/dL — ABNORMAL HIGH (ref 70–99)
Glucose-Capillary: 116 mg/dL — ABNORMAL HIGH (ref 70–99)
Glucose-Capillary: 112 mg/dL — ABNORMAL HIGH (ref 70–99)

## 2014-06-05 LAB — BASIC METABOLIC PANEL
Anion gap: 12 (ref 5–15)
BUN: 10 mg/dL (ref 6–23)
CHLORIDE: 107 meq/L (ref 96–112)
CO2: 19 mEq/L (ref 19–32)
Calcium: 8.3 mg/dL — ABNORMAL LOW (ref 8.4–10.5)
Creatinine, Ser: 0.63 mg/dL (ref 0.50–1.10)
GFR calc Af Amer: >90 mL/min (ref >90)
GFR calc non Af Amer: >90 mL/min (ref >90)
Glucose, Bld: 130 mg/dL — ABNORMAL HIGH (ref 70–99)
POTASSIUM: 4.2 meq/L (ref 3.7–5.3)
Sodium: 138 mEq/L (ref 137–147)

## 2014-06-05 MED ORDER — CHLORHEXIDINE GLUCONATE 0.12 % MT SOLN: 15.0000 mL | Freq: Two times a day (BID) | OROMUCOSAL | Status: DC

## 2014-06-05 MED ORDER — CETYLPYRIDINIUM CHLORIDE 0.05 % MT LIQD
7.0000 mL | Freq: Two times a day (BID) | OROMUCOSAL | Status: DC
  Administered 2014-06-05 – 2014-06-08 (×4): 7 mL via OROMUCOSAL

## 2014-06-05 MED ORDER — CETYLPYRIDINIUM CHLORIDE 0.05 % MT LIQD: 7.0000 mL | Freq: Two times a day (BID) | OROMUCOSAL | Status: DC

## 2014-06-05 MED ORDER — FENTANYL CITRATE 0.05 MG/ML IJ SOLN
12.5000 ug | INTRAMUSCULAR | Status: DC | PRN
  Administered 2014-06-05: 25 ug via INTRAVENOUS
  Administered 2014-06-06: 12.5 ug via INTRAVENOUS
  Administered 2014-06-08: 25 ug via INTRAVENOUS
  Filled 2014-06-05 (×4): qty 2

## 2014-06-05 MED ORDER — PNEUMOCOCCAL VAC POLYVALENT 25 MCG/0.5ML IJ INJ
0.5000 mL | INJECTION | INTRAMUSCULAR | Status: AC
  Administered 2014-06-06: 0.5 mL via INTRAMUSCULAR
  Filled 2014-06-05: qty 0.5

## 2014-06-05 NOTE — Progress Notes (Signed)
  Echocardiogram 2D Echocardiogram has been performed.  Mary Le 06/05/2014, 2:13 PM

## 2014-06-05 NOTE — Progress Notes (Signed)
VASCULAR LAB PRELIMINARY  PRELIMINARY  PRELIMINARY  PRELIMINARY  Carotid Dopplers completed.    Preliminary report:  1-39% ICA stenosis.  Vertebral artery flow is antegrade.   Maranatha Grossi, RVT 06/05/2014, 12:21 PM

## 2014-06-05 NOTE — Procedures (Signed)
Extubation Procedure Note  Patient Details:   Name: Mary Le DOB: [birth year] MRN: [id number]   Airway Documentation:  Airway 7.5 mm (Active)  Secured at (cm) 22 cm 06/05/2014  8:42 AM  Measured From Lips 06/05/2014  8:42 AM  Secured Location Right 06/05/2014  8:42 AM  Secured By [employer] 06/05/2014  8:42 AM  Tube Holder Repositioned Yes 06/05/2014  8:42 AM  Cuff Pressure (cm H2O) 30 cm H2O 06/05/2014  3:01 AM  Site Condition Dry 06/05/2014  8:42 AM  Suctioned ett and oral cavity prior to extubation, placed on 4lpm [place]. Pt tolerated well.  Evaluation  O2 sats: stable throughout Complications: No apparent complications Patient did tolerate procedure well. Bilateral Breath Sounds: Diminished Suctioning: Airway Yes  Donella Stade 06/05/2014, 9:04 AM

## 2014-06-05 NOTE — Progress Notes (Signed)
    [facility] has been requested to perform a transesophageal echocardiogram on Mary Le  for CVA.  After careful review of history and examination, the risks and benefits of transesophageal echocardiogram have been explained including risks of esophageal damage, perforation (1:10,000 risk), bleeding, pharyngeal hematoma as well as other potential complications associated with conscious sedation including aspiration, arrhythmia, respiratory failure and death. Alternatives to treatment were discussed, questions were answered. Patient is willing to proceed.   Richardson Dopp, PA-C 06/05/2014 12:28 PM

## 2014-06-05 NOTE — Plan of Care (Signed)
Problem: Phase II Progression Outcomes Goal: Barriers To Progression Addressed/Resolved Outcome: Progressing Goal: Discharge plan in place and appropriate Outcome: Progressing Goal: Pain controlled with appropriate interventions Outcome: Progressing Goal: Hemodynamically stable Outcome: Progressing

## 2014-06-05 NOTE — Progress Notes (Signed)
Pt has a positive cuff leak at this time.

## 2014-06-05 NOTE — Progress Notes (Signed)
SLP Cancellation Note  Patient Details Name: Mary Le MRN: [id number] DOB: 10-Jul-1946   Cancelled treatment:        Orders received for BSE. Pt just recently extubated (@ [phone]). Previous note by RRT indicated a positive cuff-leak shortly before extubation, which increases risk for upper airway obstruction and need for reintubation. ST to allow today to ensure pt tolerates being off ventilator, and will complete BSE when appropriate.   Celia B. [place], [facility], CCC-SLP [phone]  Shonna Chock 06/05/2014, 10:05 AM

## 2014-06-05 NOTE — Progress Notes (Signed)
PULMONARY  / [place]   Name: Mary Le MRN: [id number] DOB: 09/19/1945    ADMISSION DATE:  06/03/2014 CONSULTATION DATE: June 05, 2014  REQUESTING CLINICIAN: Alexis Goodell, MD  PRIMARY SERVICE: Neurology  CHIEF COMPLAINT:  Left sided weakness  BRIEF PATIENT DESCRIPTION: 68 y/o woman w/ hx of HTN, HLD who presents w/ L hemiparasis and R MCA stroke, last known well at 13:30 on 11/13.  SIGNIFICANT EVENTS / STUDIES:  06/03/14 R MCA clot retrieval > 11/14 MRI/MRA brain > Acute infarction 50-70% of R MCA territory, mild swelling, no shift, no hemorrhage, MRA return of flow in R MCA  LINES / TUBES: 7.71mm ETT placed 11/13 > L rad A-line placed 11/13 > PIV x2 placed 11/13 Foley placed 11/13  CULTURES: None  ANTIBIOTICS: Ancef x1 dose, 11/13   SUBJECTIVE:  Following commands, passing SBT, she has a cuff leak today  VITAL SIGNS: Temp:  [97.5 F (36.4 C)-99.5 F (37.5 C)] 97.5 F (36.4 C) (11/15 0800) Pulse Rate:  [57-98] 98 (11/15 0800) Resp:  [12-18] 17 (11/15 0800) BP: (99-155)/(42-119) 140/119 mmHg (11/15 0800) SpO2:  [99 %-100 %] 100 % (11/15 0800) Arterial Line BP: (76-197)/(55-107) 197/107 mmHg (11/15 0800) FiO2 (%):  [40 %] 40 % (11/15 0842) Weight:  [101.4 kg (223 lb 8.7 oz)] 101.4 kg (223 lb 8.7 oz) (11/15 0419) HEMODYNAMICS:   VENTILATOR SETTINGS: Vent Mode:  [-] PSV FiO2 (%):  [40 %] 40 % Set Rate:  [15 bmp] 15 bmp Vt Set:  [500 mL] 500 mL PEEP:  [5 cmH20] 5 cmH20 Pressure Support:  [5 cmH20-10 cmH20] 10 cmH20 Plateau Pressure:  [10 cmH20-16 cmH20] 16 cmH20 INTAKE / OUTPUT: Intake/Output      11/14 0701 - 11/15 0700 11/15 0701 - 11/16 0700   I.V. (mL/kg) 3046.2 (30) 107.6 (1.1)   Total Intake(mL/kg) 3046.2 (30) 107.6 (1.1)   Urine (mL/kg/hr) 3045 (1.3) 100 (0.5)   Total Output 3045 100   Net +1.2 +7.6          PHYSICAL EXAMINATION:  Gen: sedated on vent HEENT: NCAT, ETT in place PULM: CTA B CV: RRR, no mgr, no  JVD AB: BS+, soft, nontender Ext: warm, no edema, no clubbing, no cyanosis Derm: no rash or skin breakdown Neuro: A&Ox4, MAEW   LABS:  CBC  Recent Labs Lab 06/03/14 1715 06/03/14 1727 06/04/14 0510  WBC 18.1*  --  15.8*  HGB 16.1* 18.0* 12.4  HCT 48.2* 53.0* 38.0  PLT 219  --  PLATELET CLUMPS NOTED ON SMEAR, COUNT APPEARS ADEQUATE   Coag's  Recent Labs Lab 06/03/14 1735 06/04/14 0510  APTT 26 28  INR 1.04  --    BMET  Recent Labs Lab 06/03/14 1735 06/04/14 0510 06/05/14 0210  NA 136* 138 138  K 5.6* 4.2 4.2  CL 101 107 107  CO2 19 19 19   BUN 26* 17 10  CREATININE 1.16* 0.72 0.63  GLUCOSE 136* 136* 130*   Electrolytes  Recent Labs Lab 06/03/14 1735 06/04/14 0510 06/05/14 0210  CALCIUM 9.5 7.8* 8.3*  MG  --  1.9  --   PHOS  --  3.2  --    Sepsis Markers No results for input(s): LATICACIDVEN, PROCALCITON, O2SATVEN in the last 168 hours. ABG  Recent Labs Lab 06/04/14 0052  PHART 7.348*  PCO2ART 37.0  PO2ART 256.0*   Liver Enzymes  Recent Labs Lab 06/03/14 1735  AST 16  ALT 12  ALKPHOS 54  BILITOT 0.3  ALBUMIN 3.4*  Cardiac Enzymes  Recent Labs Lab 06/04/14 0030  TROPONINI <0.30   Glucose  Recent Labs Lab 06/04/14 1123 06/04/14 1126 06/04/14 1618 06/04/14 2021 06/04/14 2356 06/05/14 0424  GLUCAP 39* 76 50* 90 131* 125*    Imaging Reviewed, see summaries above  EKG: NSR CXR: ETT in place, atelectasis in bases  ASSESSMENT / PLAN: NEUROLOGIC A: R MCA Stroke, s/p mechanical clot removal as out of window for tpa Sedation for mechanical ventilation P:   Defer antiplatelet strategy/secondary stroke prevention per neurology. Wean sedation to off, then extubate  PULMONARY A: Acute respiratory failure due to inability to protect airway 11/14 no cuff leak> improved 11/15 after decadron P:   Full support for now until 4 hours post sheath removal SBT/likely extubation today Maintain decadron for 24 hours  more  CARDIOVASCULAR A: HTN post MCA ischemic stroke and IR intervention P:   Cardene gtt per neurology parameters  RENAL A: AKI on CKD > resolved Hyperkalemia > resolved P:   Monitor BMET and UOP Replace electrolytes as needed  GASTROINTESTINAL A: No active issues P: SLP eval post extubation  HEMATOLOGIC A: No acute issues P:   Cbc intermittent  INFECTIOUS A: No active issues. P:    ENDOCRINE A: Hx of HLD P:   Will need statin in setting of acute stroke, defer to neurology for this.  TODAY'S SUMMARY: 68 y/o woman with risk factors of HTN and HLD who presented 11/13 with acute stroke s/p mechanical thrombus removal. Following commands, extubation held 11/14 due to lack of cuff leak, improved 11/15 post decadron.  Plan extubation today.  I have personally obtained a history, examined the patient, evaluated laboratory and imaging results, formulated the assessment and plan and placed orders.  CRITICAL CARE: The patient is critically ill with multiple organ systems failure and requires high complexity decision making for assessment and support, frequent evaluation and titration of therapies, application of advanced monitoring technologies and extensive interpretation of multiple databases. Critical Care Time devoted to patient care services described in this note is 35 minutes.   Roselie Awkward, MD [place] [facility] Pager: [phone] Cell: [phone] If no response, call [phone]

## 2014-06-05 NOTE — Progress Notes (Signed)
STROKE TEAM PROGRESS NOTE   HISTORY Mary Le is an 68 y.o. female who awakened normal today. Went to have an MRI and returned normal. She did complain of not feeling well and laid down for a nap. When her family went to awaken her they noted a left facial droop and left sided weakness. EMS was called at that time and the patient was brought in as a code stroke. Initial NIHSS of 18. Per family patient has complained of a headache for the past 3 days.  Date last known well: Date: 06/03/2014 Time last known well: Time: 13:30 tPA Given: No: Outside time window   SUBJECTIVE (INTERVAL HISTORY) Daughter and husband are at the bedside.  She was just extubated and she was doing much better. BP stabilized with IVF. Will need TEE and loop recorder. Denies racing heart or palpitation.  OBJECTIVE Temp:  [97 F (36.1 C)-99.5 F (37.5 C)] 97 F (36.1 C) (11/15 0930) Pulse Rate:  [57-98] 84 (11/15 0930) Cardiac Rhythm:  [-] Normal sinus rhythm (11/14 2000) Resp:  [12-23] 14 (11/15 0930) BP: (99-155)/(42-119) 139/54 mmHg (11/15 0930) SpO2:  [98 %-100 %] 98 % (11/15 0930) Arterial Line BP: (76-197)/(55-107) 181/82 mmHg (11/15 0900) FiO2 (%):  [40 %] 40 % (11/15 0842) Weight:  [223 lb 8.7 oz (101.4 kg)] 223 lb 8.7 oz (101.4 kg) (11/15 0419)   Recent Labs Lab 06/04/14 1126 06/04/14 1618 06/04/14 2021 06/04/14 2356 06/05/14 0424  GLUCAP 76 50* 90 131* 125*    Recent Labs Lab 06/03/14 1727 06/03/14 1735 06/04/14 0510 06/05/14 0210  NA 134* 136* 138 138  K 7.8* 5.6* 4.2 4.2  CL 109 101 107 107  CO2  --  19 19 19   GLUCOSE 120* 136* 136* 130*  BUN 42* 26* 17 10  CREATININE 1.10 1.16* 0.72 0.63  CALCIUM  --  9.5 7.8* 8.3*  MG  --   --  1.9  --   PHOS  --   --  3.2  --     Recent Labs Lab 06/03/14 1735  AST 16  ALT 12  ALKPHOS 54  BILITOT 0.3  PROT 7.3  ALBUMIN 3.4*    Recent Labs Lab 06/03/14 1715 06/03/14 1727 06/04/14 0510  WBC 18.1*  --  15.8*  NEUTROABS  14.9*  --  13.0*  HGB 16.1* 18.0* 12.4  HCT 48.2* 53.0* 38.0  MCV 83.7  --  83.7  PLT 219  --  PLATELET CLUMPS NOTED ON SMEAR, COUNT APPEARS ADEQUATE    Recent Labs Lab 06/04/14 0030  TROPONINI <0.30    Recent Labs  06/03/14 1735  LABPROT 13.7  INR 1.04    Recent Labs  06/03/14 1842  COLORURINE YELLOW  LABSPEC 1.035*  PHURINE 5.5  GLUCOSEU NEGATIVE  HGBUR NEGATIVE  BILIRUBINUR SMALL*  KETONESUR NEGATIVE  PROTEINUR 30*  UROBILINOGEN 0.2  NITRITE NEGATIVE  LEUKOCYTESUR NEGATIVE       Component Value Date/Time   CHOL 219* 06/04/2014 0510   TRIG 163* 06/04/2014 0510   HDL 36* 06/04/2014 0510   CHOLHDL 6.1 06/04/2014 0510   VLDL 33 06/04/2014 0510   LDLCALC 150* 06/04/2014 0510   Lab Results  Component Value Date   HGBA1C 6.2* 06/04/2014      Component Value Date/Time   LABOPIA NONE DETECTED 06/03/2014 1842   COCAINSCRNUR NONE DETECTED 06/03/2014 1842   LABBENZ NONE DETECTED 06/03/2014 1842   AMPHETMU NONE DETECTED 06/03/2014 1842   THCU POSITIVE* 06/03/2014 1842   LABBARB NONE DETECTED  06/03/2014 1842     Recent Labs Lab 06/03/14 [place] <11   I have personally reviewed the radiological images below and agree with the radiology interpretations.  Ct Angio Head and neck W/cm &/or Wo Cm  06/03/2014    IMPRESSION: 1. Distal right M1 occlusion. 2. Mild carotid siphon calcification without significant stenosis. 3. No cervical carotid stenosis. Question bilateral cervical ICA fibromuscular dysplasia. 4. Possible developing low density in the right temporal lobe, parietal lobe, and insula, which may reflect acute infarct.    Ct Head Wo Contrast  06/03/2014   IMPRESSION: Contrast staining within the RIGHT frontal lobe consistent with recent DSA, without CT findings of hemorrhage or, definite propagation of known RIGHT middle cerebral artery infarct.  Mildly hypodense mesial LEFT temporal lobe likely artifact though, MRI could be performed if there are  referable symptoms.      06/03/2014   IMPRESSION: No acute abnormality noted. 06/03/2014 17:35   Portable Chest Xray  06/03/2014  IMPRESSION: 1. Endotracheal tube with the tip 4.2 cm above the carina. 2. Left lower lobe airspace disease which may reflect atelectasis versus developing pneumonia.       LE venous doppler - No obvious evidence of DVT, superficial thrombosis, or Baker's cyst. Right common femoral vein not visualized due to bandages.  2D echo - pending  Mr [name] Wo Contrast  06/04/2014    IMPRESSION: Acute infarction of affecting 50-70% of the right MCA territory in the region of the insula, frontoparietal and high parietal region. The area of involvement includes the pre and postcentral gyri. Mild swelling but no shift. No hemorrhage at this time. The brain otherwise is normal.  MRA shows return of flow in the right middle cerebral artery territory. There is a luxury perfusion pattern in the region of infarction.      PHYSICAL EXAM  Temp:  [97 F (36.1 C)-99.5 F (37.5 C)] 97 F (36.1 C) (11/15 0930) Pulse Rate:  [57-98] 84 (11/15 0930) Resp:  [12-23] 14 (11/15 0930) BP: (99-155)/(42-119) 139/54 mmHg (11/15 0930) SpO2:  [98 %-100 %] 98 % (11/15 0930) Arterial Line BP: (76-197)/(55-107) 181/82 mmHg (11/15 0900) FiO2 (%):  [40 %] 40 % (11/15 0842) Weight:  [223 lb 8.7 oz (101.4 kg)] 223 lb 8.7 oz (101.4 kg) (11/15 0419)  General - Well nourished, well developed, intubated.  Ophthalmologic - not cooperate on exam  Cardiovascular - Regular rate and rhythm with no murmur.  Mental Status -  Level of arousal and orientation to time, place, and person were intact. Language including expression, naming, repetition with simple sentences, comprehension was assessed and found intact, mild dysarthria.  Cranial Nerves II - XII - II - blinking to visual threat bilaterally. III, IV, VI - Extraocular movements intact. V - Facial sensation intact bilaterally. VII - left  facial droop VIII - Hearing & vestibular intact bilaterally. X - Palate elevates symmetrically, mild dysarthria. XI - Chin turning & shoulder shrug intact bilaterally. XII - Tongue protrusion intact.  Motor Strength - The patient's strength was LUE 1/5, LLE 4/5, RUE and RLE 5/5.  Bulk was normal and fasciculations were absent.   Motor Tone - Muscle tone was assessed at the neck and appendages and was normal.  Reflexes - The patient's reflexes were 1+ in all extremities and she had no pathological reflexes.  Sensory - Light touch, temperature/pinprick were assessed and were symmetrical.    Coordination - The patient had normal movements in right hand with no ataxia or  dysmetria.  Tremor was absent.  Gait and Station - not able to test as she was just extubated.   ASSESSMENT/PLAN [name] is a 68 y.o. female with history of HTN, HLD presenting with left hemiplegia, left neglect and HH, right gaze. She did not receive IV t-PA due to out of window. CTA showed right M1 cut off and IR performed thrombectomy and revascularization of right M1.    Stroke:  Non-dominant right MCA infarct embolic secondary to unknown etiology  Resultant left hemiparesis  MRI  Right large MCA stroke  MRA  Luxury perfusion on the right hemisphere  CTA showed right M1 occlusion prior to IR  Carotid Doppler see CTA  2D Echo  Pending  LE DVT negative  LDL 150, not at the goal < 70  HgbA1c 6.2, on the goal <6.5  Will schedule TEE in am with potential loop recorder placement. NPO after midnight  Heparin subq for VTE prophylaxis  Diet NPO for now, pending swallow evaluation   no antithrombotics prior to admission, now on ASA 300 supporsitory. May change to po ASA if passed swallow.  Ongoing aggressive risk factor management  Therapy recommendations:  pending  Disposition:  Pending  Transfer to floor in am if stable.   Respiratory failure - extubated this am - pt currently room air -  pending swallow  Hypertension  Home meds:   Lisinopril and metoprolol  Stable Permissive hypertension (OK if <220/120) for 24-48 hours post stroke and then gradually normalized within 5-7 days. Continue IVF for 24 hours to avoid hypotension   Hyperlipidemia  Home meds:  lovaza  LDL 150, not at goal < 70  No po access now  Allergy to statin  May try pravastatin or fenofibrate when po access established.  Other Stroke Risk Factors  Advanced age  CAD  Hospital day # 2  Rosalin Hawking, MD PhD Stroke Neurology 06/05/2014 10:31 AM     To contact Stroke Continuity provider, please refer to [email or web address] After hours, contact General Neurology

## 2014-06-05 NOTE — Plan of Care (Signed)
Problem: Consults Goal: Vascular Cath Int Patient Education (See Patient Education module for education specifics.)  Outcome: Completed/Met Date Met:  06/05/14  Problem: Phase I Progression Outcomes Goal: Hemodynamically stable Outcome: Completed/Met Date Met:  06/05/14  Problem: Acute Treatment Outcomes Goal: Neuro exam at baseline or improved Outcome: Completed/Met Date Met:  06/05/14 Goal: BP within ordered parameters Outcome: Completed/Met Date Met:  06/05/14 Goal: Airway maintained/protected Outcome: Completed/Met Date Met:  06/05/14 Goal: 02 Sats > 94% Outcome: Completed/Met Date Met:  06/05/14 Goal: Hemodynamically stable Outcome: Completed/Met Date Met:  06/05/14 Goal: tPA Patient w/o S&S of bleeding Outcome: Not Applicable Date Met:  06/05/14 Goal: Prognosis discussed with family/patient as appropriate Outcome: Completed/Met Date Met:  06/05/14 Goal: Other Acute Treatment Outcomes Outcome: Not Applicable Date Met:  06/05/14  Problem: Progression Outcomes Goal: Communication method established Outcome: Completed/Met Date Met:  06/05/14 Goal: If vent dependent, tolerates weaning Outcome: Not Applicable Date Met:  06/05/14 Goal: Progressive activity as tolerated Outcome: Completed/Met Date Met:  06/05/14     

## 2014-06-05 NOTE — Evaluation (Signed)
Clinical/Bedside Swallow Evaluation Patient Details  Name: Mary Le MRN: [id number] Date of Birth: Dec 22, 1945  Today's Date: 06/05/2014 Time: 1450-1535 SLP Time Calculation (min) (ACUTE ONLY): 45 min  Past Medical History:  Past Medical History  Diagnosis Date  . Hypertension   . Hypercholesteremia   . Back pain     arthritis   Past Surgical History:  Past Surgical History  Procedure Laterality Date  . Appendectomy    . Tonsillectomy    . Hemorroidectomy    . Tubal ligation     HPI:  68 year old female admitted 11/131/5 due to left side weakness an headache. PMH significant for HTN. Pt intubated 11/13-15/15. BSE ordered to evaluate swallow function and safety.   Assessment / Plan / Recommendation Clinical Impression  Pt  awake and alert with minimal stimulation. Oral care completed with suction. Oral motor assessment reveals marked left weakness, and pt reports decreased sensation on her left. Her speech is remarkably intelligible given significant weakness (left side labial and lingual movement is minimal). Pt accepted trials of ice chips, thin liquid, nectar thick liquid, puree, and solid consistencies. Pt appeared to tolerate ice chips, nectar thick, puree, and solid consistencies without overt s/s aspiration or clinical indication of airway compromise. Generalized oral residue was noted with puree and cracker, which cleared with liquid wash. Of interest is that pt commented that she expected the cracker to hurt (given sore throat), but that it did not, AND that the puree was more difficult to swallow due to the bolus being "all over the mouth".  Pt's volitional cough was notably weak, reflexive cough on thin liquids appeared strong and effective. Will recommend beginning trials of full liquid and nectar thick consistencies when awake and alert. Left neglect was apparent during this assessment, so oral care before and after po intake is recommended to check for left side oral  pocketing and/or residual. Full supervision is also recommended given left negelct and anticipation of impulsive behavior. Pt appeared to fatigue quickly during this assessment, which should be taken into consideration during po trials. RN aware of results and recommendations. Safe swallow and ice chip precautions were posted in pt room. ST to follow for diet tolerance/readiness to advance and SLE.     Aspiration Risk  Mild    Diet Recommendation Nectar-thick liquid (full liquid)   Liquid Administration via: Straw Medication Administration: Via alternative means Supervision: Staff to assist with self feeding;Full supervision/cueing for compensatory strategies Compensations: Slow rate;Small sips/bites Postural Changes and/or Swallow Maneuvers: Seated upright 90 degrees;Upright 30-60 min after meal    Other  Recommendations Oral Care Recommendations: Oral care before and after PO Other Recommendations: Order thickener from pharmacy   Follow Up Recommendations   (TBD)    Frequency and Duration min 2x/week  2 weeks   Pertinent Vitals/Pain VSS, pt reports sore throat    SLP Swallow Goals  diet tolerance   Swallow Study Prior Functional Status   No reported history of swallowing difficulty.    General Date of Onset: 06/03/14 HPI: 68 year old female admitted 11/131/5 due to left side weakness an headache. PMH significant for HTN. Pt intubated 11/13-15/15. BSE ordered to evaluate swallow function and safety. Type of Study: Bedside swallow evaluation Previous Swallow Assessment: none Diet Prior to this Study: NPO Temperature Spikes Noted: No Respiratory Status: Nasal cannula History of Recent Intubation: Yes Length of Intubations (days): 2 days Date extubated: 06/05/14 Behavior/Cognition: Alert;Cooperative;Pleasant mood (left neglect apparent) Oral Cavity - Dentition: Adequate natural dentition Self-Feeding  Abilities: Needs assist Patient Positioning: Upright in bed Baseline  Vocal Quality: Clear Volitional Cough: Weak Volitional Swallow: Able to elicit    Oral/Motor/Sensory Function Overall Oral Motor/Sensory Function: Impaired Labial ROM: Reduced left Labial Symmetry: Abnormal symmetry left Labial Strength: Reduced Labial Sensation: Reduced Lingual ROM: Reduced left Lingual Symmetry: Abnormal symmetry left Lingual Strength: Reduced Lingual Sensation: Reduced Facial ROM: Reduced left Facial Symmetry: Left droop Facial Strength: Reduced Facial Sensation: Reduced Velum: Within Functional Limits Mandible: Within Functional Limits   Ice Chips Ice chips: Within functional limits Presentation: Spoon   Thin Liquid Thin Liquid: Impaired Presentation: Straw Oral Phase Impairments: Reduced labial seal;Reduced lingual movement/coordination Oral Phase Functional Implications: Left anterior spillage Pharyngeal  Phase Impairments: Cough - Delayed;Suspected delayed Swallow    Nectar Thick Nectar Thick Liquid: Within functional limits Presentation: Straw   Honey Thick Honey Thick Liquid: Not tested   Puree Puree: Impaired Presentation: Spoon Oral Phase Impairments: Reduced labial seal;Reduced lingual movement/coordination Oral Phase Functional Implications: Oral residue Pharyngeal Phase Impairments: Suspected delayed Swallow   Solid   GO    Solid: Impaired Oral Phase Impairments: Reduced lingual movement/coordination;Impaired anterior to posterior transit Oral Phase Functional Implications: Oral residue Pharyngeal Phase Impairments: Suspected delayed Swallow;Multiple swallows      Christoper Bushey B. Quentin Ore [facility], [place] [phone]  Shonna Chock 06/05/2014,4:05 PM

## 2014-06-05 NOTE — Progress Notes (Signed)
Subjective: Pt just extubated a short time ago.   Objective: Physical Exam: BP 139/54 mmHg  Pulse 84  Temp(Src) 97 F (36.1 C) (Oral)  Resp 14  Ht 5\' 7"  (1.702 m)  Wt 223 lb 8.7 oz (101.4 kg)  BMI 35.00 kg/m2  SpO2 98% (R)groin soft, no hematoma, good bilat femoral pulses. Feet warm.  Labs: CBC  Recent Labs  06/03/14 1715 06/03/14 1727 06/04/14 0510  WBC 18.1*  --  15.8*  HGB 16.1* 18.0* 12.4  HCT 48.2* 53.0* 38.0  PLT 219  --  PLATELET CLUMPS NOTED ON SMEAR, COUNT APPEARS ADEQUATE   BMET  Recent Labs  06/04/14 0510 06/05/14 0210  NA 138 138  K 4.2 4.2  CL 107 107  CO2 19 19  GLUCOSE 136* 130*  BUN 17 10  CREATININE 0.72 0.63  CALCIUM 7.8* 8.3*   LFT  Recent Labs  06/03/14 1735  PROT 7.3  ALBUMIN 3.4*  AST 16  ALT 12  ALKPHOS 54  BILITOT 0.3   PT/INR  Recent Labs  06/03/14 1735  LABPROT 13.7  INR 1.04     Studies/Results: Ct Angio Head W/cm &/or Wo Cm  06/03/2014   CLINICAL DATA:  Code stroke. Left-sided weakness and slurred speech.  EXAM: CT ANGIOGRAPHY HEAD AND NECK  TECHNIQUE: Multidetector CT imaging of the head and neck was performed using the standard protocol during bolus administration of intravenous contrast. Multiplanar CT image reconstructions and MIPs were obtained to evaluate the vascular anatomy. Carotid stenosis measurements (when applicable) are obtained utilizing NASCET criteria, using the distal internal carotid diameter as the denominator.  CONTRAST:  26mL OMNIPAQUE IOHEXOL 350 MG/ML SOLN  COMPARISON:  Head CT 06/03/2014  FINDINGS: CTA HEAD FINDINGS  On postcontrast head CT images, there is question of developing hypoattenuation in the right temporal lobe, right insula, and possibly right parietal lobe. Ventricles and sulci are within normal limits. No acute intracranial hemorrhage, mass, midline shift, or extra-axial fluid collection is seen. There is no abnormal enhancement. Orbits are unremarkable. Mastoid air cells and  paranasal sinuses are clear.  Internal carotid arteries are patent from skullbase to carotid termini. There is mild bilateral carotid siphon calcification without evidence of significant stenosis. There is occlusion of the distal right M1 segment. Only minimal flow is present in some distal right MCA branches. Left MCA is unremarkable. ACAs are unremarkable.  Intracranial vertebral arteries are patent to the basilar. PICA origins are patent. AICAs and SCAs are not clearly identified. Basilar artery is small in caliber diffusely, likely on a congenital basis, without focal stenosis. There are patent posterior communicating arteries bilaterally. No PCA stenosis is identified. No intracranial aneurysm is identified.  Review of the MIP images confirms the above findings.  CTA NECK FINDINGS  Three vessel aortic arch. The brachiocephalic and subclavian arteries are unremarkable. Common carotid and cervical internal carotid arteries are patent without stenosis. Proximal left ICA is tortuous. There is suggestion of a slightly beaded appearance to the mid and distal cervical ICAs, however evaluation is mildly limited by motion artifact through this region. Vertebral arteries are patent and codominant without evidence of stenosis.  Subsegmental atelectasis is present dependently in the visualized lungs. Subcentimeter nodule is incidentally noted in the left thyroid lobe. Mild multilevel cervical spondylosis is noted.  Review of the MIP images confirms the above findings.  IMPRESSION: 1. Distal right M1 occlusion. 2. Mild carotid siphon calcification without significant stenosis. 3. No cervical carotid stenosis. Question bilateral cervical ICA fibromuscular dysplasia. 4.  Possible developing low density in the right temporal lobe, parietal lobe, and insula, which may reflect acute infarct.  Critical Value/emergent results (preliminary finding of right M1 occlusion) were called by telephone at the time of interpretation on  06/03/2014 at 6:15 pm to Dr. Alexis Goodell , who verbally acknowledged these results.   Electronically Signed   By: Logan Bores   On: 06/03/2014 18:35   Ct Head Wo Contrast  06/03/2014   CLINICAL DATA:  Stroke, status post interventional radiology procedure. LEFT middle cerebral artery occlusion.  EXAM: CT HEAD WITHOUT CONTRAST  TECHNIQUE: Contiguous axial images were obtained from the base of the skull through the vertex without intravenous contrast.  COMPARISON:  CT of the head June 03, 2014 at 1751 hr  FINDINGS: Persistent blurring of the gray-white matter differentiation of the RIGHT temporal lobe though, somewhat improved from prior examination. Somewhat hypodense LEFT mesial temporal lobe, which could be artifact. Mildly dense intracranial vessels consistent with recent digital subtraction angiography. Minimal density within the RIGHT posterior frontal lobe most consistent with contrast staining corresponding to known RIGHT middle cerebral artery territory infarct. No hemorrhage, midline shift. The ventricles and sulci are overall normal for patient's age. Mild white matter changes suggest chronic small vessel ischemic disease. No abnormal extra-axial fluid collections. Basal cisterns are patent. Moderate calcific atherosclerosis of the carotid bulbs.  Ocular globes and orbital contents are unremarkable. Paranasal sinuses and mastoid air cells appear well-aerated. No skull fracture.  IMPRESSION: Contrast staining within the RIGHT frontal lobe consistent with recent DSA, without CT findings of hemorrhage or, definite propagation of known RIGHT middle cerebral artery infarct.  Mildly hypodense mesial LEFT temporal lobe likely artifact though, MRI could be performed if there are referable symptoms.   Electronically Signed   By: Elon Alas   On: 06/03/2014 21:48   Ct Head Wo Contrast  06/03/2014   CLINICAL DATA:  Left-sided facial  EXAM: CT HEAD WITHOUT CONTRAST  TECHNIQUE: Contiguous axial  images were obtained from the base of the skull through the vertex without intravenous contrast.  COMPARISON:  None.  FINDINGS: The bony calvarium is intact. No gross soft tissue abnormality is noted. No findings to suggest acute hemorrhage, acute infarction or space-occupying mass lesion are noted.  IMPRESSION: No acute abnormality noted. These results were called by telephone at the time of interpretation on 06/03/2014 at 5:35 pm to Dr. Doy Mince, who verbally acknowledged these results.   Electronically Signed   By: Inez Catalina M.D.   On: 06/03/2014 17:35   Ct Angio Neck W/cm &/or Wo/cm  06/03/2014   CLINICAL DATA:  Code stroke. Left-sided weakness and slurred speech.  EXAM: CT ANGIOGRAPHY HEAD AND NECK  TECHNIQUE: Multidetector CT imaging of the head and neck was performed using the standard protocol during bolus administration of intravenous contrast. Multiplanar CT image reconstructions and MIPs were obtained to evaluate the vascular anatomy. Carotid stenosis measurements (when applicable) are obtained utilizing NASCET criteria, using the distal internal carotid diameter as the denominator.  CONTRAST:  28mL OMNIPAQUE IOHEXOL 350 MG/ML SOLN  COMPARISON:  Head CT 06/03/2014  FINDINGS: CTA HEAD FINDINGS  On postcontrast head CT images, there is question of developing hypoattenuation in the right temporal lobe, right insula, and possibly right parietal lobe. Ventricles and sulci are within normal limits. No acute intracranial hemorrhage, mass, midline shift, or extra-axial fluid collection is seen. There is no abnormal enhancement. Orbits are unremarkable. Mastoid air cells and paranasal sinuses are clear.  Internal carotid arteries are  patent from skullbase to carotid termini. There is mild bilateral carotid siphon calcification without evidence of significant stenosis. There is occlusion of the distal right M1 segment. Only minimal flow is present in some distal right MCA branches. Left MCA is unremarkable.  ACAs are unremarkable.  Intracranial vertebral arteries are patent to the basilar. PICA origins are patent. AICAs and SCAs are not clearly identified. Basilar artery is small in caliber diffusely, likely on a congenital basis, without focal stenosis. There are patent posterior communicating arteries bilaterally. No PCA stenosis is identified. No intracranial aneurysm is identified.  Review of the MIP images confirms the above findings.  CTA NECK FINDINGS  Three vessel aortic arch. The brachiocephalic and subclavian arteries are unremarkable. Common carotid and cervical internal carotid arteries are patent without stenosis. Proximal left ICA is tortuous. There is suggestion of a slightly beaded appearance to the mid and distal cervical ICAs, however evaluation is mildly limited by motion artifact through this region. Vertebral arteries are patent and codominant without evidence of stenosis.  Subsegmental atelectasis is present dependently in the visualized lungs. Subcentimeter nodule is incidentally noted in the left thyroid lobe. Mild multilevel cervical spondylosis is noted.  Review of the MIP images confirms the above findings.  IMPRESSION: 1. Distal right M1 occlusion. 2. Mild carotid siphon calcification without significant stenosis. 3. No cervical carotid stenosis. Question bilateral cervical ICA fibromuscular dysplasia. 4. Possible developing low density in the right temporal lobe, parietal lobe, and insula, which may reflect acute infarct.  Critical Value/emergent results (preliminary finding of right M1 occlusion) were called by telephone at the time of interpretation on 06/03/2014 at 6:15 pm to Dr. Alexis Goodell , who verbally acknowledged these results.   Electronically Signed   By: Logan Bores   On: 06/03/2014 18:35   Mr Mary Le Head Wo Contrast  06/04/2014   CLINICAL DATA:  Left-sided neglect. Left gaze. Right middle cerebral artery occlusion with endovascular treatment.  EXAM: MRI HEAD WITHOUT  CONTRAST  MRA HEAD WITHOUT CONTRAST  TECHNIQUE: Multiplanar, multiecho pulse sequences of the brain and surrounding structures were obtained without intravenous contrast. Angiographic images of the head were obtained using MRA technique without contrast.  COMPARISON:  Head CT 06/03/2014.  Angiography 06/03/2014  FINDINGS: MRI HEAD FINDINGS  There is no abnormality affecting the brainstem or cerebellum. There is acute infarction affecting the insula, right frontoparietal and parietal region. The area of infarction involves about 50-70% of the right MCA territory. There is mild swelling but no midline shift at this time. No hemorrhagic transformation. Infarction does involve the pre and postcentral gyrus. Elsewhere, the brain appears normal without any other old small or large vessel infarction. No mass lesion, hydrocephalus or extra-axial collection. No pituitary mass. No inflammatory sinus disease.  MRA HEAD FINDINGS  Both internal carotid arteries are widely patent into the brain. No siphon stenosis. The anterior and middle cerebral vessels are patent without proximal stenosis, aneurysm or vascular malformation. There is recanalization of flow in the right MCA region with luxury perfusion in the area of infarction.  Both vertebral arteries are patent to the basilar. There is 50% stenosis at the right vertebrobasilar junction. No basilar stenosis. Posterior circulation branch vessels are patent.  IMPRESSION: Acute infarction of affecting 50-70% of the right MCA territory in the region of the insula, frontoparietal and high parietal region. The area of involvement includes the pre and postcentral gyri. Mild swelling but no shift. No hemorrhage at this time. The brain otherwise is normal.  MRA shows  return of flow in the right middle cerebral artery territory. There is a luxury perfusion pattern in the region of infarction.   Electronically Signed   By: Nelson Chimes M.D.   On: 06/04/2014 19:22   Mr Brain Wo  Contrast  06/04/2014   CLINICAL DATA:  Left-sided neglect. Left gaze. Right middle cerebral artery occlusion with endovascular treatment.  EXAM: MRI HEAD WITHOUT CONTRAST  MRA HEAD WITHOUT CONTRAST  TECHNIQUE: Multiplanar, multiecho pulse sequences of the brain and surrounding structures were obtained without intravenous contrast. Angiographic images of the head were obtained using MRA technique without contrast.  COMPARISON:  Head CT 06/03/2014.  Angiography 06/03/2014  FINDINGS: MRI HEAD FINDINGS  There is no abnormality affecting the brainstem or cerebellum. There is acute infarction affecting the insula, right frontoparietal and parietal region. The area of infarction involves about 50-70% of the right MCA territory. There is mild swelling but no midline shift at this time. No hemorrhagic transformation. Infarction does involve the pre and postcentral gyrus. Elsewhere, the brain appears normal without any other old small or large vessel infarction. No mass lesion, hydrocephalus or extra-axial collection. No pituitary mass. No inflammatory sinus disease.  MRA HEAD FINDINGS  Both internal carotid arteries are widely patent into the brain. No siphon stenosis. The anterior and middle cerebral vessels are patent without proximal stenosis, aneurysm or vascular malformation. There is recanalization of flow in the right MCA region with luxury perfusion in the area of infarction.  Both vertebral arteries are patent to the basilar. There is 50% stenosis at the right vertebrobasilar junction. No basilar stenosis. Posterior circulation branch vessels are patent.  IMPRESSION: Acute infarction of affecting 50-70% of the right MCA territory in the region of the insula, frontoparietal and high parietal region. The area of involvement includes the pre and postcentral gyri. Mild swelling but no shift. No hemorrhage at this time. The brain otherwise is normal.  MRA shows return of flow in the right middle cerebral artery  territory. There is a luxury perfusion pattern in the region of infarction.   Electronically Signed   By: Nelson Chimes M.D.   On: 06/04/2014 19:22   Mr Lumbar Spine Wo Contrast  06/03/2014   CLINICAL DATA:  Low back pain radiating into the anterior right thigh with numbness for 4 months. No acute injury or prior relevant surgery. Initial encounter.  EXAM: MRI LUMBAR SPINE WITHOUT CONTRAST  TECHNIQUE: Multiplanar, multisequence MR imaging of the lumbar spine was performed. No intravenous contrast was administered.  COMPARISON:  None.  FINDINGS: Five lumbar type vertebral bodies are assumed. The alignment is normal. There is no evidence of fracture or pars defect. The lumbar pedicles are somewhat short on a congenital basis.  The conus medullaris extends to the L1 level and appears normal. No paraspinal abnormalities are identified.There are probable left renal parapelvic cysts, incompletely visualized.  No significant disc space findings are present at T12-L1 or L1-2.  L2-3: Annular disc bulging eccentric to the left with moderate facet and ligamentous hypertrophy. There are small bilateral facet joint effusions. There is triangulation of the thecal sac with minimal asymmetric narrowing of the left lateral recess and left foramen. No nerve root encroachment identified.  L3-4: Disc height and hydration are maintained. There is mild disc bulging with moderate facet and ligamentous hypertrophy. There are bilateral facet joint effusions. There is borderline spinal stenosis. The lateral recesses and foramina are patent.  L4-5: There is annular disc bulging with a shallow central disc protrusion. Moderately advanced  facet degenerative changes are asymmetric to the right. There is mild spinal stenosis with left greater than right lateral recess narrowing. The foramina are sufficiently patent.  L5-S1: Disc height and hydration are maintained. There is mild bilateral facet hypertrophy. No spinal stenosis or nerve root  encroachment.  IMPRESSION: 1. No explanation for the patient's symptoms identified. No evidence of right-sided nerve root encroachment. 2. Shallow central disc protrusion at L4-5 with moderate bilateral facet hypertrophy contributing to mild spinal stenosis and left greater than right lateral recess narrowing. 3. Borderline spinal stenosis and moderate facet hypertrophy at L2-3 and L3-4.   Electronically Signed   By: Camie Patience M.D.   On: 06/03/2014 15:26   Portable Chest Xray  06/03/2014   CLINICAL DATA:  Acute respiratory failure  EXAM: PORTABLE CHEST - 1 VIEW  COMPARISON:  04/12/2014  FINDINGS: There is a endotracheal tube with the tip 4.2 cm above the carina. There is elevation of the right diaphragm. There is left lower lobe airspace disease likely reflecting atelectasis versus pneumonia. There is no pleural effusion or pneumothorax. The heart and mediastinal contours are unremarkable.  The osseous structures are unremarkable.  IMPRESSION: 1. Endotracheal tube with the tip 4.2 cm above the carina. 2. Left lower lobe airspace disease which may reflect atelectasis versus developing pneumonia.   Electronically Signed   By: Kathreen Devoid   On: 06/03/2014 23:36   Dg Abd Portable 1v  06/05/2014   CLINICAL DATA:  OG tube placement  EXAM: PORTABLE ABDOMEN - 1 VIEW  COMPARISON:  None.  FINDINGS: Very distal tip of a presumed OG or NG tube projects over the left upper quadrant. The majority of this tube is not included on this exam. The technologist was asked to repeat the examination, to include the upper abdomen and lower chest. When the technologist went to attempt a repeat imaged, she was informed that the tube had been removed, and therefore repeat image was not performed. Bowel gas pattern is nonobstructive. Rectal thermistor in place.  IMPRESSION: Nonobstructive bowel gas pattern. Very distal tip of probable an OG or NG tube projects over the superior most aspect of the image in the left upper quadrant,  as described above.   Electronically Signed   By: Curlene Dolphin M.D.   On: 06/05/2014 08:46    Assessment/Plan: S/P rt common carotid arteriogram Followed by complete revascularization of occluded RT MCA , Using 5 mg of superselective integrelin and x2 passes with A 4 x 30mm Solitaire FR retrieval device Will report to Maxene [facility]ington   LOS: 2 days    Ascencion Dike PA-C 06/05/2014 9:57 AM

## 2014-06-06 ENCOUNTER — Encounter (HOSPITAL_COMMUNITY): Payer: Self-pay | Admitting: Cardiology

## 2014-06-06 DIAGNOSIS — I63419 Cerebral infarction due to embolism of unspecified middle cerebral artery: Secondary | ICD-10-CM | POA: Diagnosis not present

## 2014-06-06 LAB — GLUCOSE, CAPILLARY
GLUCOSE-CAPILLARY: 39 mg/dL — AB (ref 70–99)
GLUCOSE-CAPILLARY: 97 mg/dL (ref 70–99)
GLUCOSE-CAPILLARY: 97 mg/dL (ref 70–99)
Glucose-Capillary: 99 mg/dL (ref 70–99)
Glucose-Capillary: 87 mg/dL (ref 70–99)

## 2014-06-06 LAB — BASIC METABOLIC PANEL
Anion gap: 15 (ref 5–15)
BUN: 15 mg/dL (ref 6–23)
CALCIUM: 8.5 mg/dL (ref 8.4–10.5)
CO2: 19 mEq/L (ref 19–32)
Chloride: 108 mEq/L (ref 96–112)
Creatinine, Ser: 0.71 mg/dL (ref 0.50–1.10)
GFR calc non Af Amer: 88 mL/min — ABNORMAL LOW (ref >90)
Glucose, Bld: 126 mg/dL — ABNORMAL HIGH (ref 70–99)
POTASSIUM: 4 meq/L (ref 3.7–5.3)
SODIUM: 142 meq/L (ref 137–147)

## 2014-06-06 MED ORDER — ADULT MULTIVITAMIN W/MINERALS CH
1.0000 | ORAL_TABLET | Freq: Every day | ORAL | Status: DC
  Administered 2014-06-06 – 2014-06-08 (×3): 1 via ORAL
  Filled 2014-06-06 (×3): qty 1

## 2014-06-06 MED ORDER — SODIUM CHLORIDE 0.9 % IV SOLN
INTRAVENOUS | Status: DC
  Administered 2014-06-06: 1000 mL via INTRAVENOUS

## 2014-06-06 MED ORDER — PRAVASTATIN SODIUM 20 MG PO TABS
20.0000 mg | ORAL_TABLET | Freq: Every day | ORAL | Status: DC
  Filled 2014-06-06: qty 1

## 2014-06-06 MED ORDER — ASPIRIN 300 MG RE SUPP
300.0000 mg | Freq: Every day | RECTAL | Status: DC
  Administered 2014-06-07 – 2014-06-08 (×2): 300 mg via RECTAL
  Filled 2014-06-06 (×2): qty 1

## 2014-06-06 MED ORDER — LISINOPRIL 20 MG PO TABS
20.0000 mg | ORAL_TABLET | Freq: Every day | ORAL | Status: DC
  Administered 2014-06-06 – 2014-06-08 (×3): 20 mg via ORAL
  Filled 2014-06-06 (×3): qty 1

## 2014-06-06 MED ORDER — METOPROLOL SUCCINATE ER 25 MG PO TB24
25.0000 mg | ORAL_TABLET | Freq: Every day | ORAL | Status: DC
  Administered 2014-06-06 – 2014-06-08 (×3): 25 mg via ORAL
  Filled 2014-06-06 (×3): qty 1

## 2014-06-06 NOTE — Progress Notes (Signed)
REcieved report from Maudie Mercury, [place] on [place].  RN will remove foley before patient is transferred.  Kerri Perches

## 2014-06-06 NOTE — Plan of Care (Signed)
Problem: SLP Dysphagia Goals Goal: Patient will utilize recommended strategies Patient will utilize recommended strategies during swallow to increase swallowing safety with  Outcome: Progressing     

## 2014-06-06 NOTE — Progress Notes (Signed)
PT Cancellation Note  Patient Details Name: Mary Le MRN: [id number] DOB: 1945-10-18   Cancelled Treatment:    Reason Eval/Treat Not Completed: Patient at procedure or test/unavailable  Attempted to see pt x 2 with pt unavailable both times (with SLP, being transferred to 4N). Will see 06/07/14 as pt available.   [name] 06/06/2014, 4:36 PM Pager [phone]

## 2014-06-06 NOTE — Progress Notes (Signed)
Speech Language Pathology Treatment: Dysphagia  Patient Details Name: Mary Le MRN: [id number] DOB: 1946/07/06 Today's Date: 06/06/2014 Time: 3710-6269 SLP Time Calculation (min) (ACUTE ONLY): 51 min  Assessment / Plan / Recommendation Clinical Impression  Pt demonstrates improved swallow function as compared to previous session 11/15. Pt tolerated trials of nectar thick liquids, thin liquids, puree, and regular without signs or symptoms of aspiration. She retained a mild amount of residue dispersed throughout the oral cavity after trials of puree and regular, cleared by following with liquid. She continues to experience left sided weakness and reduced sensation resulting in mild anterior loss decreased awareness of food loss on that side. Recommend dysphagia 2 (chopped) diet with thin liquids and full supervision for compensatory strategies (small single bites and sips, follow solids with liquids.) SLP will continue to follow.   HPI HPI: 68 year old female admitted 11/131/5 due to left side weakness an headache. PMH significant for HTN. Pt intubated 11/13-15/15. BSE ordered to evaluate swallow function and safety.   Pertinent Vitals Pain Assessment: No/denies pain Pain Score: 0-No pain  SLP Plan  Continue with current plan of care    Recommendations Diet recommendations: Dysphagia 2 (fine chop);Thin liquid Liquids provided via: Cup;Straw Medication Administration: Whole meds with puree Supervision: Staff to assist with self feeding;Full supervision/cueing for compensatory strategies Compensations: Slow rate;Small sips/bites;Check for pocketing;Check for anterior loss;Follow solids with liquid Postural Changes and/or Swallow Maneuvers: Seated upright 90 degrees              General recommendations: Rehab consult Oral Care Recommendations: Oral care BID Follow up Recommendations: Inpatient Rehab Plan: Continue with current plan of care    GO     Eden Emms 06/06/2014,  1:48 PM

## 2014-06-06 NOTE — Consult Note (Signed)
Reason for Consult:   TEE/ Loop  Requesting Physician: Neurology  HPI: This is a 68 y.o. female with a past medical history significant for chest pain, seen by Dr T.Turner in July 2015. She had a low risk Myoview then. Other problems include HTN, morbid obesity, dyslipidemia with statin intolerance, and a family history of early CAD. She presented 06/03/14 with an acute Rt Brain CVA. She underwent Rt MCA clot retrieval 06/03/14 by Dr Estanislado Pandy. She was followed by CCM as she initially was intubated. She is now extubated and we have been asked to arrange TEE and Loop recorder implant.   PMHx:  Past Medical History  Diagnosis Date  . Hypertension   . Hypercholesteremia   . Back pain     arthritis    Past Surgical History  Procedure Laterality Date  . Appendectomy    . Tonsillectomy    . Hemorroidectomy    . Tubal ligation      SOCHx:  reports that she has quit smoking. She has never used smokeless tobacco. She reports that she drinks alcohol. She reports that she does not use illicit drugs.  FAMHx: Family History  Problem Relation Age of Onset  . Heart disease Mother   . Kidney disease Father     ALLERGIES: Allergies  Allergen Reactions  . Aspirin Nausea And Vomiting  . Statins Other (See Comments)    Myalgias and chest pain    ROS: See HPI above, ROS otherwise unremarkable.   HOME MEDICATIONS: Prior to Admission medications   Medication Sig Start Date End Date Taking? Authorizing Provider  acetaminophen (TYLENOL) 325 MG tablet Take 650 mg by mouth every 6 (six) hours as needed (pain).     Historical Provider, MD  azithromycin (ZITHROMAX) 250 MG tablet Take 2 tabs PO x 1 dose, then 1 tab PO QD x 4 days 04/12/14   Roselee Culver, MD  B Complex Vitamins (B COMPLEX-B12 PO) Take 1 tablet by mouth daily.     Historical Provider, MD  fish oil-omega-3 fatty acids 1000 MG capsule Take 1 g by mouth daily.     Historical Provider, MD  lisinopril  (PRINIVIL,ZESTRIL) 20 MG tablet Take 1 tablet (20 mg total) by mouth daily. 04/22/14   Chelle S Jeffery, PA-C  loratadine (ALAVERT) 10 MG tablet Take 10 mg by mouth daily as needed for allergies.    Historical Provider, MD  meloxicam (MOBIC) 7.5 MG tablet Take 1-2 tablets (7.5-15 mg total) by mouth daily. 01/03/14   [name], MD  metoprolol succinate (TOPROL-XL) 25 MG 24 hr tablet Take 1 tablet (25 mg total) by mouth daily. 04/22/14   Chelle Janalee Dane, PA-C  Multiple Vitamin (MULTIVITAMIN WITH MINERALS) TABS tablet Take 1 tablet by mouth daily.    Historical Provider, MD  Polyethyl Glycol-Propyl Glycol (SYSTANE OP) Place 1 drop into both eyes daily as needed (dry eyeys).    Historical Provider, MD  promethazine-codeine (PHENERGAN WITH CODEINE) 6.25-10 MG/5ML syrup Take 5-10 mLs by mouth every 6 (six) hours as needed. 04/12/14   Roselee Culver, MD    HOSPITAL MEDICATIONS: I have reviewed the patient's current medications.  VITALS: Blood pressure 131/81, pulse 63, temperature 97.8 F (36.6 C), temperature source Oral, resp. rate 13, height 5\' 7"  (1.702 m), weight 227 lb 1.2 oz (103 kg), SpO2 100 %.  PHYSICAL EXAM: General appearance: cooperative, no distress and morbidly obese Neck: no carotid bruit Lungs: decreased breath sounds but clear  Heart: regular rate and rhythm Abdomen: obese Extremities: no edema Pulses: diminnished Skin: cool and dry Neurologic: Grossly normal, she answers questions appropriately and is oriented.  HEENT- OP clear  LABS: Results for orders placed or performed during the hospital encounter of 06/03/14 (from the past 24 hour(s))  Glucose, capillary     Status: Abnormal   Collection Time: 06/05/14 12:42 PM  Result Value Ref Range   Glucose-Capillary 116 (H) 70 - 99 mg/dL  Glucose, capillary     Status: Abnormal   Collection Time: 06/05/14  5:21 PM  Result Value Ref Range   Glucose-Capillary 112 (H) 70 - 99 mg/dL  Glucose, capillary     Status:  Abnormal   Collection Time: 06/05/14  9:38 PM  Result Value Ref Range   Glucose-Capillary 115 (H) 70 - 99 mg/dL  Basic metabolic panel     Status: Abnormal   Collection Time: 06/06/14  2:24 AM  Result Value Ref Range   Sodium 142 137 - 147 mEq/L   Potassium 4.0 3.7 - 5.3 mEq/L   Chloride 108 96 - 112 mEq/L   CO2 19 19 - 32 mEq/L   Glucose, Bld 126 (H) 70 - 99 mg/dL   BUN 15 6 - 23 mg/dL   Creatinine, Ser 0.71 0.50 - 1.10 mg/dL   Calcium 8.5 8.4 - 10.5 mg/dL   GFR calc non Af Amer 88 (L) >90 mL/min   GFR calc Af Amer >90 >90 mL/min   Anion gap 15 5 - 15    EKG: NSR   Echo: 06/05/14 Study Conclusions  - Left ventricle: The cavity size was normal. Systolic function was normal. The estimated ejection fraction was in the range of 60% to 65%. Wall motion was normal; there were no regional wall motion abnormalities. - Aortic valve: Moderately calcified annulus. Trileaflet; mildly thickened, mildly calcified leaflets. - Mitral valve: There was mild regurgitation. - Pulmonary arteries: PA peak pressure: 34 mm Hg (S).  Impressions:  - The right ventricular systolic pressure was increased consistent with mild pulmonary hypertension.  IMAGING: Mr Brain Wo Contrast  06/04/2014   CLINICAL DATA:  Left-sided neglect. Left gaze. Right middle cerebral artery occlusion with endovascular treatment.  EXAM: MRI HEAD WITHOUT CONTRAST  MRA HEAD WITHOUT CONTRAST  TECHNIQUE:     IMPRESSION: Acute infarction of affecting 50-70% of the right MCA territory in the region of the insula, frontoparietal and high parietal region. The area of involvement includes the pre and postcentral gyri. Mild swelling but no shift. No hemorrhage at this time. The brain otherwise is normal.  MRA shows return of flow in the right middle cerebral artery territory. There is a luxury perfusion pattern in the region of infarction.   Electronically Signed   By: Nelson Chimes M.D.   On: 06/04/2014 19:22     IMPRESSION: Principal Problem:   Cerebral infarction due to occlusion of right middle cerebral artery Active Problems:   Acute respiratory failure with hypoxia   Chest pain- Low risk Myoview July 2015   Benign essential HTN   Dyslipidemia- statin intol   Altered mental status   Morbid obesity- BMI 40   RECOMMENDATION: TEE and possible loop, unable to get her on the schedule today, scheduled for tomorrow. She has already been consented.    Time Spent Directly with Patient: 30 minutes  Erlene Quan [phone] beeper 06/06/2014, 9:06 AM    Assessment and Plan:  1. Cryptogenic stroke The patient presents with cryptogenic stroke.  The patient has  a TEE planned for this AM.  I spoke at length with the patient about monitoring for afib with an implantable loop recorder.  Risks, benefits, and alteratives to implantable loop recorder were discussed with the patient today.   At this time, the patient is very clear in their decision to proceed with implantable loop recorder.   Please call with questions.

## 2014-06-06 NOTE — Plan of Care (Signed)
Problem: SLP Cognition Goals Goal: Patient will demonstrate awareness during Patient will demonstrate awareness during functional ADL for improved safety Outcome: Progressing

## 2014-06-06 NOTE — Evaluation (Signed)
Speech Language Pathology Evaluation Patient Details Name: Mary Le MRN: [id number] DOB: 03/12/1946 Today's Date: 06/06/2014 Time: 0174-9449 SLP Time Calculation (min) (ACUTE ONLY): 51 min  Problem List:  Patient Active Problem List   Diagnosis Date Noted  . Morbid obesity- BMI 40 06/06/2014  . Acute respiratory failure with hypoxia 06/05/2014  . Cerebral infarction due to occlusion of right middle cerebral artery 06/03/2014  . Cerebral infarct 06/03/2014  . Altered mental status 06/03/2014  . GERD (gastroesophageal reflux disease) 04/12/2014  . Benign essential HTN 02/07/2014  . Dyslipidemia- statin intol 02/07/2014  . Chest pain- Low risk Myoview July 2015 02/06/2014   Past Medical History:  Past Medical History  Diagnosis Date  . Hypertension   . Hypercholesteremia   . Back pain     arthritis   Past Surgical History:  Past Surgical History  Procedure Laterality Date  . Appendectomy    . Tonsillectomy    . Hemorroidectomy    . Tubal ligation    . Radiology with anesthesia N/A 06/03/2014    Procedure: RADIOLOGY WITH ANESTHESIA;  Surgeon: [name], MD;  Location: [place];  Service: Radiology;  Laterality: N/A;   HPI:  68 year old female admitted 11/131/5 due to left side weakness an headache. PMH significant for HTN. Pt intubated 11/13-15/15. BSE ordered to evaluate swallow function and safety.   Assessment / Plan / Recommendation Clinical Impression   Pt demonstrates cognitive impairments characterized by decreased safety awareness and a neglect of the left visual field that will likely cause difficulty with pt's ability to safely complete functional ADL's at home. Her speech is dysarthric, resulting in slower and more labored output, without effect on intelligibility. Her family reports noticing in a change in how she sounds since admission. SLP provided verbal cueing for overarticulation and intake of a large breath before speaking which was effective in  improving rate and clarity of speech. SLP also provided verbal cueing for scanning of the left visual field and educated the family regarding left neglect. Recommend continued speech therapy services at the acute level, and inpatient rehab at discharge to maximize pt's potential for recovery of function. SLP will continue to follow.    SLP Assessment       Follow Up Recommendations  Inpatient Rehab    Frequency and Duration min 2x/week  2 weeks   Pertinent Vitals/Pain Pain Assessment: No/denies pain Pain Score: 0-No pain   SLP Goals  Potential to Achieve Goals (ACUTE ONLY): Good  SLP Evaluation Prior Functioning  Cognitive/Linguistic Baseline: Within functional limits   Cognition  Overall Cognitive Status: Impaired/Different from baseline Arousal/Alertness: Awake/alert Orientation Level: Oriented X4 Attention: Focused;Sustained Focused Attention: Appears intact Sustained Attention: Appears intact Memory: Appears intact Awareness: Appears intact Problem Solving: Appears intact Executive Function: Sequencing;Organizing;Initiating Sequencing: Appears intact Organizing: Appears intact Initiating: Appears intact Safety/Judgment: Impaired    Comprehension  Auditory Comprehension Overall Auditory Comprehension: Appears within functional limits for tasks assessed Visual Recognition/Discrimination Discrimination: Within Function Limits Reading Comprehension Reading Status: Not tested    Expression Expression Primary Mode of Expression: Verbal Verbal Expression Overall Verbal Expression: Appears within functional limits for tasks assessed Initiation: No impairment Automatic Speech: Name;Social Response Level of Generative/Spontaneous Verbalization: Conversation Naming: No impairment Pragmatics: No impairment Written Expression Written Expression: Not tested   Oral / Motor Oral Motor/Sensory Function Overall Oral Motor/Sensory Function: Impaired Labial ROM: Reduced  left Labial Symmetry: Abnormal symmetry left Labial Strength: Reduced Labial Sensation: Reduced Lingual ROM: Reduced left Lingual Symmetry: Abnormal symmetry left  Lingual Strength: Reduced Lingual Sensation: Reduced Facial ROM: Reduced left Facial Symmetry: Left droop Facial Strength: Reduced Facial Sensation: Reduced Velum: Within Functional Limits Mandible: Within Functional Limits Motor Speech Overall Motor Speech: Impaired Respiration: Within functional limits Phonation: Normal;Low vocal intensity Resonance: Within functional limits Articulation: Impaired Level of Impairment: Conversation Intelligibility: Intelligible Motor Planning: Witnin functional limits Motor Speech Errors: Not applicable   GO     Eden Emms 06/06/2014, 1:23 PM

## 2014-06-06 NOTE — Progress Notes (Signed)
STROKE TEAM PROGRESS NOTE   HISTORY Mary Le is an 68 y.o. female who awakened normal today. Went to have an MRI and returned normal. She did complain of not feeling well and laid down for a nap. When her family went to awaken her they noted a left facial droop and left sided weakness. EMS was called at that time and the patient was brought in as a code stroke. Initial NIHSS of 18. Per family patient has complained of a headache for the past 3 days.  Date last known well: Date: 06/03/2014 Time last known well: Time: 13:30 tPA Given: No: Outside time window   SUBJECTIVE (INTERVAL HISTORY) No family is   at the bedside.  She is stable. BP stabilized now off cardene drip. Will need TEE and loop recorder. Denies racing heart or palpitation or any cardiac history.PCCM requests transfer of service to stroke MD  OBJECTIVE Temp:  [97 F (36.1 C)-99.1 F (37.3 C)] 97.8 F (36.6 C) (11/16 0800) Pulse Rate:  [60-87] 63 (11/16 0800) Cardiac Rhythm:  [-] Normal sinus rhythm (11/16 0800) Resp:  [13-18] 13 (11/16 0800) BP: (131-167)/(54-120) 131/81 mmHg (11/16 0800) SpO2:  [97 %-100 %] 100 % (11/16 0800) Weight:  [227 lb 1.2 oz (103 kg)] 227 lb 1.2 oz (103 kg) (11/16 0500)   Recent Labs Lab 06/05/14 0424 06/05/14 0838 06/05/14 1242 06/05/14 1721 06/05/14 2138  GLUCAP 125* 117* 116* 112* 115*    Recent Labs Lab 06/03/14 1727  06/03/14 1735 06/04/14 0510 06/05/14 0210 06/06/14 0224  NA 134*  --  136* 138 138 142  K 7.8*  --  5.6* 4.2 4.2 4.0  CL 109  --  101 107 107 108  CO2  --   --  19 19 19 19   GLUCOSE 120*  --  136* 136* 130* 126*  BUN 42*  --  26* 17 10 15   CREATININE 1.10  --  1.16* 0.72 0.63 0.71  CALCIUM  --   < > 9.5 7.8* 8.3* 8.5  MG  --   --   --  1.9  --   --   PHOS  --   --   --  3.2  --   --   < > = values in this interval not displayed.  Recent Labs Lab 06/03/14 1735  AST 16  ALT 12  ALKPHOS 54  BILITOT 0.3  PROT 7.3  ALBUMIN 3.4*    Recent  Labs Lab 06/03/14 1715 06/03/14 1727 06/04/14 0510  WBC 18.1*  --  15.8*  NEUTROABS 14.9*  --  13.0*  HGB 16.1* 18.0* 12.4  HCT 48.2* 53.0* 38.0  MCV 83.7  --  83.7  PLT 219  --  PLATELET CLUMPS NOTED ON SMEAR, COUNT APPEARS ADEQUATE    Recent Labs Lab 06/04/14 0030  TROPONINI <0.30    Recent Labs  06/03/14 1735  LABPROT 13.7  INR 1.04    Recent Labs  06/03/14 1842  COLORURINE YELLOW  LABSPEC 1.035*  PHURINE 5.5  GLUCOSEU NEGATIVE  HGBUR NEGATIVE  BILIRUBINUR SMALL*  KETONESUR NEGATIVE  PROTEINUR 30*  UROBILINOGEN 0.2  NITRITE NEGATIVE  LEUKOCYTESUR NEGATIVE       Component Value Date/Time   CHOL 219* 06/04/2014 0510   TRIG 163* 06/04/2014 0510   HDL 36* 06/04/2014 0510   CHOLHDL 6.1 06/04/2014 0510   VLDL 33 06/04/2014 0510   LDLCALC 150* 06/04/2014 0510   Lab Results  Component Value Date   HGBA1C 6.2* 06/04/2014  Component Value Date/Time   LABOPIA NONE DETECTED 06/03/2014 1842   COCAINSCRNUR NONE DETECTED 06/03/2014 1842   LABBENZ NONE DETECTED 06/03/2014 1842   AMPHETMU NONE DETECTED 06/03/2014 1842   THCU POSITIVE* 06/03/2014 1842   LABBARB NONE DETECTED 06/03/2014 1842     Recent Labs Lab 06/03/14 1828  ETH <11   I have personally reviewed the radiological images below and agree with the radiology interpretations.  Ct Angio Head and neck W/cm &/or Wo Cm  06/03/2014    IMPRESSION: 1. Distal right M1 occlusion. 2. Mild carotid siphon calcification without significant stenosis. 3. No cervical carotid stenosis. Question bilateral cervical ICA fibromuscular dysplasia. 4. Possible developing low density in the right temporal lobe, parietal lobe, and insula, which may reflect acute infarct.    Ct Head Wo Contrast  06/03/2014   IMPRESSION: Contrast staining within the RIGHT frontal lobe consistent with recent DSA, without CT findings of hemorrhage or, definite propagation of known RIGHT middle cerebral artery infarct.  Mildly hypodense  mesial LEFT temporal lobe likely artifact though, MRI could be performed if there are referable symptoms.      06/03/2014   IMPRESSION: No acute abnormality noted. 06/03/2014 17:35   Portable Chest Xray  06/03/2014  IMPRESSION: 1. Endotracheal tube with the tip 4.2 cm above the carina. 2. Left lower lobe airspace disease which may reflect atelectasis versus developing pneumonia.       LE venous doppler - No obvious evidence of DVT, superficial thrombosis, or Baker's cyst. Right common femoral vein not visualized due to bandages.  2D echo - pending  Mr [name] Wo Contrast  06/04/2014    IMPRESSION: Acute infarction of affecting 50-70% of the right MCA territory in the region of the insula, frontoparietal and high parietal region. The area of involvement includes the pre and postcentral gyri. Mild swelling but no shift. No hemorrhage at this time. The brain otherwise is normal.  MRA shows return of flow in the right middle cerebral artery territory. There is a luxury perfusion pattern in the region of infarction.      PHYSICAL EXAM  Temp:  [97 F (36.1 C)-99.1 F (37.3 C)] 97.8 F (36.6 C) (11/16 0800) Pulse Rate:  [60-87] 63 (11/16 0800) Resp:  [13-18] 13 (11/16 0800) BP: (131-167)/(54-120) 131/81 mmHg (11/16 0800) SpO2:  [97 %-100 %] 100 % (11/16 0800) Weight:  [227 lb 1.2 oz (103 kg)] 227 lb 1.2 oz (103 kg) (11/16 0500)  General - Well nourished, well developed middle aged african [place] lady,    Ophthalmologic - not cooperate on exam  Cardiovascular - Regular rate and rhythm with no murmur.  Mental Status -  Level of arousal and orientation to time, place, and person were intact. Language including expression, naming, repetition with simple sentences, comprehension was assessed and found intact, mild dysarthria.  Cranial Nerves II - XII - II - acuity adequate but  left homonymous hemianopsia III, IV, VI - Extraocular movements intact.right gaze preference but able to  look all the way to left V - Facial sensation intact bilaterally. VII - left facial droop VIII - Hearing & vestibular intact bilaterally. X - Palate elevates symmetrically, mild dysarthria. XI - Chin turning & shoulder shrug intact bilaterally. XII - Tongue protrusion intact.  Motor Strength - The patient's strength was LUE 1/5, LLE 4/5, RUE and RLE 5/5.  Bulk was normal and fasciculations were absent.   Motor Tone - Muscle tone was assessed at the neck and appendages and was normal.  Reflexes - The patient's reflexes were 1+ in all extremities and she had no pathological reflexes.  Sensory - Light touch, temperature/pinprick were assessed and were asymmetrical and diminished on left.    Coordination - The patient had normal movements in right hand with no ataxia or dysmetria.  Tremor was absent.  Gait and Station - not able to test as she was just extubated. NIHSS 7  ASSESSMENT/PLAN Ms. Frady Taddeo is a 68 y.o. female with history of HTN, HLD presenting with left hemiplegia, left neglect and HH, right gaze. She did not receive IV t-PA due to out of window. CTA showed right M1 cut off and IR performed thrombectomy and revascularization of right M1.    Stroke:  Non-dominant right MCA infarct embolic secondary to unknown etiology  Resultant left hemiparesis, left hemianopsia, facial weakness and dysarthria  MRI  Right large MCA stroke  MRA  Luxury perfusion on the right hemisphere  CTA showed right M1 occlusion prior to IR  Carotid Doppler see CTA  2D Echo Left ventricle: The cavity size was normal. Systolic function was normal. The estimated ejection fraction was in the range of 60% to 65%. Wall motion was normal; there were no regional wall motion abnormalities.  LE DVT negative  LDL 150, not at the goal < 70  HgbA1c 6.2, on the goal <6.5   TEE  Today with potential loop recorder placement.    Heparin subq for VTE prophylaxis  Diet NPO for now, pending swallow  evaluation   no antithrombotics prior to admission, now on   po ASA    Ongoing aggressive risk factor management  Therapy recommendations:  pending  Disposition:  Pending  Transfer to floor in am if stable.   Respiratory failure -resolved doing well post extubation - pt currently room air - pending swallow  Hypertension  Home meds:   Lisinopril and metoprolol  Stable Permissive hypertension (OK if <220/120) for 24-48 hours post stroke and then gradually normalized within 5-7 days. Continue IVF for 24 hours to avoid hypotension   Hyperlipidemia  Home meds:  lovaza  LDL 150, not at goal < 70  No po access now  Allergy to statin  Start pravastatin   Other Stroke Risk Factors  Advanced age  CAD Transfer to floor today. Mobilize out of bed. PT/OT consults. Likely need inpatient rehab. TEE/LOOP today.Resume home medicines. Start Pravastatin for lipids  Hospital day # 3 I have personally examined this patient, reviewed notes, independently viewed imaging studies, participated in medical decision making and plan of care. I have made any additions or clarifications directly to the above note.    Antony Contras, MD Medical Director [place] Pager: [phone] 06/06/2014 9:28 AM  Antony Contras, MD Stroke Neurology 06/06/2014 9:20 AM     To contact Stroke Continuity provider, please refer to [email or web address] After hours, contact General Neurology

## 2014-06-06 NOTE — Progress Notes (Signed)
Patient arrived on unit and telemetry was notified.  Kizzie Bane, RN

## 2014-06-06 NOTE — Progress Notes (Signed)
NUTRITION FOLLOW UP  DOCUMENTATION CODES Per approved criteria  -Obesity Unspecified   INTERVENTION: Once diet advanced, recommend Ensure Complete po BID, each supplement provides 350 kcal and 13 grams of protein  NUTRITION DIAGNOSIS: Inadequate oral intake related to decreased appetite  as evidenced by per pt report; ongoing  Goal: Enteral nutrition to provide 60-70% of estimated calorie needs (22-25 kcals/kg ideal body weight) and 100% of estimated protein needs, based on ASPEN guidelines for hypocaloric, high protein feeding in critically ill obese individuals; ongoing; NA  New Goal: Pt to meet >/= 90% of their estimated nutrition needs   Monitor: PO intake, supplement acceptance, labs and wt trends   ASSESSMENT: 68 y/o woman w/ hx of HTN, HLD who presents w/ L hemiparasis and R MCA stroke, last known well at 13:30 on 11/13. Pt is s/p MCA stroke Bilateral lower extremity venous duplex (clot removal).   Pt extubated 11/15. Seen by SLP today and started on Dysphagia 2 diet with Thin Liquids.  Family at bedside. Pt is agreeable to trying ensure until appetite is improved.   Height: Ht Readings from Last 1 Encounters:  06/03/14 5\' 7"  (1.702 m)    Weight: Wt Readings from Last 1 Encounters:  06/06/14 227 lb 1.2 oz (103 kg)  Admission weight 221 lb (100.3 kg) 11/13  BMI:  Body mass index is 35.56 kg/(m^2). obesity class II  Re-Estimated Nutritional Needs: Kcal: 1700-1900 Protein: 85-100 grams Fluid: 1.7 liters daily   Skin: WDL  Diet Order: Diet NPO time specified for TEE   Intake/Output Summary (Last 24 hours) at 06/06/14 1509 Last data filed at 06/06/14 1400  Gross per 24 hour  Intake   2400 ml  Output   1400 ml  Net   1000 ml    Last BM:  11/15  Labs:   Recent Labs Lab 06/04/14 0510 06/05/14 0210 06/06/14 0224  NA 138 138 142  K 4.2 4.2 4.0  CL 107 107 108  CO2 19 19 19   BUN 17 10 15   CREATININE 0.72 0.63 0.71  CALCIUM 7.8* 8.3* 8.5  MG 1.9  --    --   PHOS 3.2  --   --   GLUCOSE 136* 130* 126*    CBG (last 3)   Recent Labs  06/05/14 2138 06/06/14 0939 06/06/14 1212  GLUCAP 115* 97 97    Scheduled Meds: . antiseptic oral rinse  7 mL Mouth Rinse BID  . [START ON 06/07/2014] aspirin  300 mg Rectal Daily  . heparin  5,000 Units Subcutaneous 3 times per day  . insulin aspart  0-9 Units Subcutaneous TID WC  . lisinopril  20 mg Oral Daily  . metoprolol succinate  25 mg Oral Daily  . multivitamin with minerals  1 tablet Oral Daily  . pravastatin  20 mg Oral q1800    Continuous Infusions: . sodium chloride 100 mL/hr at 06/06/14 1249  . sodium chloride 1,000 mL (06/06/14 1445)  . niCARDipine Stopped (06/05/14 0932)   [place], [place], [place] Pager [phone] After Hours Pager

## 2014-06-07 ENCOUNTER — Encounter (HOSPITAL_COMMUNITY)
Admission: EM | Disposition: A | Discharge status: Rehabilitation Facility | Payer: Medicare Other | Source: Home / Self Care | Attending: Neurology

## 2014-06-07 ENCOUNTER — Encounter (HOSPITAL_COMMUNITY): Payer: Self-pay | Admitting: Cardiology

## 2014-06-07 DIAGNOSIS — I341 Nonrheumatic mitral (valve) prolapse: Secondary | ICD-10-CM

## 2014-06-07 DIAGNOSIS — G8190 Hemiplegia, unspecified affecting unspecified side: Secondary | ICD-10-CM

## 2014-06-07 DIAGNOSIS — I639 Cerebral infarction, unspecified: Secondary | ICD-10-CM

## 2014-06-07 HISTORY — PX: LOOP RECORDER IMPLANT: SHX5477

## 2014-06-07 HISTORY — PX: LOOP RECORDER IMPLANT: SHX5954

## 2014-06-07 HISTORY — PX: TEE WITHOUT CARDIOVERSION: SHX5443

## 2014-06-07 LAB — GLUCOSE, CAPILLARY
GLUCOSE-CAPILLARY: 86 mg/dL (ref 70–99)
GLUCOSE-CAPILLARY: 104 mg/dL — AB (ref 70–99)
GLUCOSE-CAPILLARY: 88 mg/dL (ref 70–99)
Glucose-Capillary: 71 mg/dL (ref 70–99)

## 2014-06-07 SURGERY — ECHOCARDIOGRAM, TRANSESOPHAGEAL
Anesthesia: Moderate Sedation

## 2014-06-07 SURGERY — LOOP RECORDER IMPLANT
Anesthesia: LOCAL

## 2014-06-07 MED ORDER — BUTAMBEN-TETRACAINE-BENZOCAINE 2-2-14 % EX AERO
INHALATION_SPRAY | CUTANEOUS | Status: DC | PRN
  Administered 2014-06-07: 2 via TOPICAL

## 2014-06-07 MED ORDER — FENTANYL CITRATE 0.05 MG/ML IJ SOLN
INTRAMUSCULAR | Status: AC
  Filled 2014-06-07: qty 2

## 2014-06-07 MED ORDER — MIDAZOLAM HCL 5 MG/ML IJ SOLN
INTRAMUSCULAR | Status: AC
  Filled 2014-06-07: qty 2

## 2014-06-07 MED ORDER — MIDAZOLAM HCL 10 MG/2ML IJ SOLN
INTRAMUSCULAR | Status: DC | PRN
  Administered 2014-06-07: 2 mg via INTRAVENOUS

## 2014-06-07 MED ORDER — FENTANYL CITRATE 0.05 MG/ML IJ SOLN
INTRAMUSCULAR | Status: DC | PRN
  Administered 2014-06-07: 25 ug via INTRAVENOUS

## 2014-06-07 MED ORDER — LIDOCAINE-EPINEPHRINE 1 %-1:100000 IJ SOLN
INTRAMUSCULAR | Status: AC
  Filled 2014-06-07: qty 1

## 2014-06-07 NOTE — Progress Notes (Signed)
Echocardiogram Echocardiogram Transesophageal has been performed.  Joelene Millin 06/07/2014, 11:27 AM

## 2014-06-07 NOTE — Progress Notes (Signed)
Patient was screened by Gudrun Axe for appropriateness for an Inpatient Acute Rehab consult.  At this time, we are recommending Inpatient Rehab consult.  Please order when you feel appropriate.   Madysyn Hanken PT Inpatient Rehab Admissions Coordinator Cell [phone] Office [phone]  

## 2014-06-07 NOTE — Interval H&P Note (Signed)
History and Physical Interval Note:  06/07/2014 10:29 AM  Mary Le  has presented today for surgery, with the diagnosis of STROKE  The various methods of treatment have been discussed with the patient and family. After consideration of risks, benefits and other options for treatment, the patient has consented to  Procedure(s): TRANSESOPHAGEAL ECHOCARDIOGRAM (TEE) (N/A) as a surgical intervention .  The patient's history has been reviewed, patient examined, no change in status, stable for surgery.  I have reviewed the patient's chart and labs.  Questions were answered to the patient's satisfaction.     Kirk Ruths

## 2014-06-07 NOTE — Plan of Care (Signed)
Problem: Phase I Progression Outcomes Goal: Initial discharge plan identified Outcome: Progressing Goal: Voiding-avoid urinary catheter unless indicated Outcome: Completed/Met Date Met:  06/07/14 Goal: Other Phase I Outcomes/Goals Outcome: Progressing  Problem: Phase II Progression Outcomes Goal: Barriers To Progression Addressed/Resolved Outcome: Progressing Goal: Discharge plan in place and appropriate Outcome: Progressing Goal: Pain controlled with appropriate interventions Outcome: Completed/Met Date Met:  06/07/14 Goal: Hemodynamically stable Outcome: Completed/Met Date Met:  88/75/79 Goal: Complications resolved/controlled Outcome: Progressing Goal: Tolerates diet Outcome: Completed/Met Date Met:  06/07/14 Goal: Activity appropriate for discharge plan Outcome: Progressing

## 2014-06-07 NOTE — Op Note (Signed)
SURGEON:  Thompson Grayer, MD     PREPROCEDURE DIAGNOSIS:  Cryptogenic Stroke    POSTPROCEDURE DIAGNOSIS:  Cryptogenic Stroke     PROCEDURES:   1. Implantable loop recorder implantation    INTRODUCTION:  Mary Le is a 68 y.o. female with a history of unexplained stroke who presents today for implantable loop implantation.  The patient has had a cryptogenic stroke.  Despite an extensive workup by neurology, no reversible causes have been identified.  she has worn telemetry during which she did not have arrhythmias.  There is significant concern for possible atrial fibrillation as the cause for the patients stroke.  The patient therefore presents today for implantable loop implantation.     DESCRIPTION OF PROCEDURE:  Informed written consent was obtained, and the patient was brought to the electrophysiology lab in a fasting state.  The patient required no sedation for the procedure today.  Mapping over the patient's chest was performed by the EP lab staff to identify the area where electrograms were most prominent for ILR recording.  This area was found to be the left parasternal region over the 3rd-4th intercostal space. The patients left chest was therefore prepped and draped in the usual sterile fashion by the EP lab staff. The skin overlying the left parasternal region was infiltrated with lidocaine for local analgesia.  A 0.5-cm incision was made over the left parasternal region over the 3rd intercostal space.  A subcutaneous ILR pocket was fashioned using a combination of sharp and blunt dissection.  A Medtronic Reveal [place] model [id number] implantable loop recorder was then placed into the pocket  R waves were very prominent and measured 0.67mV. EBL<1 ml.  Steri- Strips and a sterile dressing were then applied.  There were no early apparent complications.     CONCLUSIONS:   1. Successful implantation of a Medtronic Reveal LINQ implantable loop recorder for cryptogenic stroke  2. No early  apparent complications.

## 2014-06-07 NOTE — H&P (View-Only) (Signed)
STROKE TEAM PROGRESS NOTE   HISTORY Mary Le is an 68 y.o. female who awakened normal today. Went to have an MRI and returned normal. She did complain of not feeling well and laid down for a nap. When her family went to awaken her they noted a left facial droop and left sided weakness. EMS was called at that time and the patient was brought in as a code stroke. Initial NIHSS of 18. Per family patient has complained of a headache for the past 3 days.  Date last known well: Date: 06/03/2014 Time last known well: Time: 13:30 tPA Given: No: Outside time window   SUBJECTIVE (INTERVAL HISTORY) No family is   at the bedside.  She is stable. BP stabilized now off cardene drip. Will need TEE and loop recorder. Denies racing heart or palpitation or any cardiac history.PCCM requests transfer of service to stroke MD  OBJECTIVE Temp:  [97 F (36.1 C)-99.1 F (37.3 C)] 97.8 F (36.6 C) (11/16 0800) Pulse Rate:  [60-87] 63 (11/16 0800) Cardiac Rhythm:  [-] Normal sinus rhythm (11/16 0800) Resp:  [13-18] 13 (11/16 0800) BP: (131-167)/(54-120) 131/81 mmHg (11/16 0800) SpO2:  [97 %-100 %] 100 % (11/16 0800) Weight:  [227 lb 1.2 oz (103 kg)] 227 lb 1.2 oz (103 kg) (11/16 0500)   Recent Labs Lab 06/05/14 0424 06/05/14 0838 06/05/14 1242 06/05/14 1721 06/05/14 2138  GLUCAP 125* 117* 116* 112* 115*    Recent Labs Lab 06/03/14 1727  06/03/14 1735 06/04/14 0510 06/05/14 0210 06/06/14 0224  NA 134*  --  136* 138 138 142  K 7.8*  --  5.6* 4.2 4.2 4.0  CL 109  --  101 107 107 108  CO2  --   --  19 19 19 19   GLUCOSE 120*  --  136* 136* 130* 126*  BUN 42*  --  26* 17 10 15   CREATININE 1.10  --  1.16* 0.72 0.63 0.71  CALCIUM  --   < > 9.5 7.8* 8.3* 8.5  MG  --   --   --  1.9  --   --   PHOS  --   --   --  3.2  --   --   < > = values in this interval not displayed.  Recent Labs Lab 06/03/14 1735  AST 16  ALT 12  ALKPHOS 54  BILITOT 0.3  PROT 7.3  ALBUMIN 3.4*    Recent  Labs Lab 06/03/14 1715 06/03/14 1727 06/04/14 0510  WBC 18.1*  --  15.8*  NEUTROABS 14.9*  --  13.0*  HGB 16.1* 18.0* 12.4  HCT 48.2* 53.0* 38.0  MCV 83.7  --  83.7  PLT 219  --  PLATELET CLUMPS NOTED ON SMEAR, COUNT APPEARS ADEQUATE    Recent Labs Lab 06/04/14 0030  TROPONINI <0.30    Recent Labs  06/03/14 1735  LABPROT 13.7  INR 1.04    Recent Labs  06/03/14 1842  COLORURINE YELLOW  LABSPEC 1.035*  PHURINE 5.5  GLUCOSEU NEGATIVE  HGBUR NEGATIVE  BILIRUBINUR SMALL*  KETONESUR NEGATIVE  PROTEINUR 30*  UROBILINOGEN 0.2  NITRITE NEGATIVE  LEUKOCYTESUR NEGATIVE       Component Value Date/Time   CHOL 219* 06/04/2014 0510   TRIG 163* 06/04/2014 0510   HDL 36* 06/04/2014 0510   CHOLHDL 6.1 06/04/2014 0510   VLDL 33 06/04/2014 0510   LDLCALC 150* 06/04/2014 0510   Lab Results  Component Value Date   HGBA1C 6.2* 06/04/2014  Component Value Date/Time   LABOPIA NONE DETECTED 06/03/2014 1842   COCAINSCRNUR NONE DETECTED 06/03/2014 1842   LABBENZ NONE DETECTED 06/03/2014 1842   AMPHETMU NONE DETECTED 06/03/2014 1842   THCU POSITIVE* 06/03/2014 1842   LABBARB NONE DETECTED 06/03/2014 1842     Recent Labs Lab 06/03/14 1828  ETH <11   I have personally reviewed the radiological images below and agree with the radiology interpretations.  Ct Angio Head and neck W/cm &/or Wo Cm  06/03/2014    IMPRESSION: 1. Distal right M1 occlusion. 2. Mild carotid siphon calcification without significant stenosis. 3. No cervical carotid stenosis. Question bilateral cervical ICA fibromuscular dysplasia. 4. Possible developing low density in the right temporal lobe, parietal lobe, and insula, which may reflect acute infarct.    Ct Head Wo Contrast  06/03/2014   IMPRESSION: Contrast staining within the RIGHT frontal lobe consistent with recent DSA, without CT findings of hemorrhage or, definite propagation of known RIGHT middle cerebral artery infarct.  Mildly hypodense  mesial LEFT temporal lobe likely artifact though, MRI could be performed if there are referable symptoms.      06/03/2014   IMPRESSION: No acute abnormality noted. 06/03/2014 17:35   Portable Chest Xray  06/03/2014  IMPRESSION: 1. Endotracheal tube with the tip 4.2 cm above the carina. 2. Left lower lobe airspace disease which may reflect atelectasis versus developing pneumonia.       LE venous doppler - No obvious evidence of DVT, superficial thrombosis, or Baker's cyst. Right common femoral vein not visualized due to bandages.  2D echo - pending  Mr [name] Wo Contrast  06/04/2014    IMPRESSION: Acute infarction of affecting 50-70% of the right MCA territory in the region of the insula, frontoparietal and high parietal region. The area of involvement includes the pre and postcentral gyri. Mild swelling but no shift. No hemorrhage at this time. The brain otherwise is normal.  MRA shows return of flow in the right middle cerebral artery territory. There is a luxury perfusion pattern in the region of infarction.      PHYSICAL EXAM  Temp:  [97 F (36.1 C)-99.1 F (37.3 C)] 97.8 F (36.6 C) (11/16 0800) Pulse Rate:  [60-87] 63 (11/16 0800) Resp:  [13-18] 13 (11/16 0800) BP: (131-167)/(54-120) 131/81 mmHg (11/16 0800) SpO2:  [97 %-100 %] 100 % (11/16 0800) Weight:  [227 lb 1.2 oz (103 kg)] 227 lb 1.2 oz (103 kg) (11/16 0500)  General - Well nourished, well developed middle aged african [place] lady,    Ophthalmologic - not cooperate on exam  Cardiovascular - Regular rate and rhythm with no murmur.  Mental Status -  Level of arousal and orientation to time, place, and person were intact. Language including expression, naming, repetition with simple sentences, comprehension was assessed and found intact, mild dysarthria.  Cranial Nerves II - XII - II - acuity adequate but  left homonymous hemianopsia III, IV, VI - Extraocular movements intact.right gaze preference but able to  look all the way to left V - Facial sensation intact bilaterally. VII - left facial droop VIII - Hearing & vestibular intact bilaterally. X - Palate elevates symmetrically, mild dysarthria. XI - Chin turning & shoulder shrug intact bilaterally. XII - Tongue protrusion intact.  Motor Strength - The patient's strength was LUE 1/5, LLE 4/5, RUE and RLE 5/5.  Bulk was normal and fasciculations were absent.   Motor Tone - Muscle tone was assessed at the neck and appendages and was normal.  Reflexes - The patient's reflexes were 1+ in all extremities and she had no pathological reflexes.  Sensory - Light touch, temperature/pinprick were assessed and were asymmetrical and diminished on left.    Coordination - The patient had normal movements in right hand with no ataxia or dysmetria.  Tremor was absent.  Gait and Station - not able to test as she was just extubated. NIHSS 7  ASSESSMENT/PLAN Mary Le is a 68 y.o. female with history of HTN, HLD presenting with left hemiplegia, left neglect and HH, right gaze. She did not receive IV t-PA due to out of window. CTA showed right M1 cut off and IR performed thrombectomy and revascularization of right M1.    Stroke:  Non-dominant right MCA infarct embolic secondary to unknown etiology  Resultant left hemiparesis, left hemianopsia, facial weakness and dysarthria  MRI  Right large MCA stroke  MRA  Luxury perfusion on the right hemisphere  CTA showed right M1 occlusion prior to IR  Carotid Doppler see CTA  2D Echo Left ventricle: The cavity size was normal. Systolic function was normal. The estimated ejection fraction was in the range of 60% to 65%. Wall motion was normal; there were no regional wall motion abnormalities.  LE DVT negative  LDL 150, not at the goal < 70  HgbA1c 6.2, on the goal <6.5   TEE  Today with potential loop recorder placement.    Heparin subq for VTE prophylaxis  Diet NPO for now, pending swallow  evaluation   no antithrombotics prior to admission, now on   po ASA    Ongoing aggressive risk factor management  Therapy recommendations:  pending  Disposition:  Pending  Transfer to floor in am if stable.   Respiratory failure -resolved doing well post extubation - pt currently room air - pending swallow  Hypertension  Home meds:   Lisinopril and metoprolol  Stable Permissive hypertension (OK if <220/120) for 24-48 hours post stroke and then gradually normalized within 5-7 days. Continue IVF for 24 hours to avoid hypotension   Hyperlipidemia  Home meds:  lovaza  LDL 150, not at goal < 70  No po access now  Allergy to statin  Start pravastatin   Other Stroke Risk Factors  Advanced age  CAD Transfer to floor today. Mobilize out of bed. PT/OT consults. Likely need inpatient rehab. TEE/LOOP today.Resume home medicines. Start Pravastatin for lipids  Hospital day # 3 I have personally examined this patient, reviewed notes, independently viewed imaging studies, participated in medical decision making and plan of care. I have made any additions or clarifications directly to the above note.    Antony Contras, MD Medical Director [place] Pager: [phone] 06/06/2014 9:28 AM  Antony Contras, MD Stroke Neurology 06/06/2014 9:20 AM     To contact Stroke Continuity provider, please refer to [email or web address] After hours, contact General Neurology

## 2014-06-07 NOTE — Consult Note (Signed)
Physical Medicine and Rehabilitation Consult Reason for Consult:CVA Referring Physician: Dr. Leonie Man   HPI: Mary Le is a 68 y.o.right hand female with history of hypertension.  Independent prior to admission living with family and assistance as needed.Presented 06/03/2014 left-sided weakness. Initial cranial CT scan negative.MRI of the brain shows acute infarct right MCA territory in the region of the insula, frontoparietal and high parietal region. MRA of the brain with no stenosis or aneurysm.echocardiogram with ejection fraction of 65% no wall motion abnormalities.Carotid Dopplers with no ICA stenosis.CTA and geode of the head and neck with distal right M1 occlusion.Lower extremity Dopplers no signs of DVT.Patient did not receive TPA. Patient underwent clot retrieval revascularization 06/03/2014 per interventional radiology. Placed on aspirin per neurology services for CVA prophylaxis and subcutaneous heparin for DVT prophylaxis.TEE completed 06/07/2014 with no thrombus normal LV function and underwent implant of loop recorder per Dr. [name]. Dysphagia 2 thin liquid diet. Physical therapy evaluation completed 06/07/2014 with recommendations of physical medicine rehabilitation consult.   Review of Systems  Gastrointestinal: Positive for constipation.  Musculoskeletal: Positive for back pain.  Neurological: Positive for weakness.  All other systems reviewed and are negative.  Past Medical History  Diagnosis Date  . Hypertension   . Hypercholesteremia   . Back pain     arthritis   Past Surgical History  Procedure Laterality Date  . Appendectomy    . Tonsillectomy    . Hemorroidectomy    . Tubal ligation    . Radiology with anesthesia N/A 06/03/2014    Procedure: RADIOLOGY WITH ANESTHESIA;  Surgeon: [name], MD;  Location: [facility];  Service: Radiology;  Laterality: N/A;   Family History  Problem Relation Age of Onset  . Coronary artery disease Mother 66    MI  .  Kidney disease Father    Social History:  reports that she has quit smoking. She has never used smokeless tobacco. She reports that she drinks alcohol. She reports that she does not use illicit drugs. Allergies:  Allergies  Allergen Reactions  . Aspirin Nausea And Vomiting  . Statins Other (See Comments)    Myalgias and chest pain (has tried Lipitor and Pravachol)   Medications Prior to Admission  Medication Sig Dispense Refill  . acetaminophen (TYLENOL) 325 MG tablet Take 650 mg by mouth every 6 (six) hours as needed (pain).     . B Complex Vitamins (B COMPLEX-B12 PO) Take 1 tablet by mouth daily.     . fish oil-omega-3 fatty acids 1000 MG capsule Take 1 g by mouth daily.     Marland Kitchen lisinopril (PRINIVIL,ZESTRIL) 20 MG tablet Take 1 tablet (20 mg total) by mouth daily. 90 tablet 0  . loratadine (ALAVERT) 10 MG tablet Take 10 mg by mouth daily as needed for allergies.    . metoprolol succinate (TOPROL-XL) 25 MG 24 hr tablet Take 1 tablet (25 mg total) by mouth daily. 90 tablet 0  . Multiple Vitamin (MULTIVITAMIN WITH MINERALS) TABS tablet Take 1 tablet by mouth daily.    Vladimir Faster Glycol-Propyl Glycol (SYSTANE OP) Place 1 drop into both eyes daily as needed (dry eyeys).      Home: Home Living Family/patient expects to be discharged to:: Private residence Living Arrangements: Spouse/significant other, Children, Other relatives (daughter, Curator) Available Help at Discharge: Family, Available 24 hours/day (can provide minguard/supervision) Type of Home: House Home Access: Stairs to enter [employer] of Steps: 1 (back entrance) Entrance Stairs-Rails: None Home Layout: Two level, Able  to live on main level with bedroom/bathroom Alternate Level Stairs-Number of Steps: 3 (does not have to use these) Home Equipment: Shower seat - built in Additional Comments: spouse had mild CVA earlier this year, daughter with breast cancer (has port and cannot lift)  Functional  History: Prior Function Level of Independence: Independent Functional Status:  Mobility: Bed Mobility Overal bed mobility: Needs Assistance Bed Mobility: Rolling, Sidelying to Sit, Sit to Sidelying Rolling: Max assist (to Rt) Sidelying to sit: Mod assist Sit to sidelying: Mod assist General bed mobility comments: trying to rise from supine with inability to problem solve alternative method; physical assist/facilitation Transfers Overall transfer level: Needs assistance Equipment used: None Transfers: Sit to/from Stand, Stand Pivot Transfers Sit to Stand: Min assist Stand pivot transfers: Min assist General transfer comment: pivot Rt and Lt to [facility] Ambulation/Gait Ambulation/Gait assistance: Min assist Ambulation Distance (Feet): 1 Feet Assistive device: None General Gait Details: side step to Lt to [facility]    ADL:    Cognition: Cognition Overall Cognitive Status: Impaired/Different from baseline Arousal/Alertness: Awake/alert Orientation Level: Oriented X4 Attention: Focused, Sustained Focused Attention: Appears intact Sustained Attention: Appears intact Memory: Appears intact Awareness: Appears intact Problem Solving: Appears intact Executive Function: Sequencing, Armed forces logistics/support/administrative officer, Initiating Sequencing: Appears intact Organizing: Appears intact Initiating: Appears intact Safety/Judgment: Impaired Cognition Arousal/Alertness: Awake/alert Behavior During Therapy: Flat affect, Impulsive Overall Cognitive Status: Impaired/Different from baseline Area of Impairment: Orientation, Attention, Safety/judgement, Awareness, Problem solving Orientation Level: Place (initially thought she was at home; then able to state [facility]) Current Attention Level: Sustained Memory: Decreased short-term memory Safety/Judgement: Decreased awareness of safety, Decreased awareness of deficits Awareness: Intellectual Problem Solving: Slow processing, Difficulty sequencing, Requires verbal cues, Requires  tactile cues General Comments: impulsive once sitting EOB wanting to get to [facility]  Blood pressure 134/58, pulse 67, temperature 98.9 F (37.2 C), temperature source Oral, resp. rate 14, height 5\' 7"  (1.702 m), weight 103 kg (227 lb 1.2 oz), SpO2 97 %. Physical Exam  Constitutional: She appears well-developed.  obese  HENT:  Left facial droop  Eyes:  Pupils round and reactive to light.  Neck: Normal range of motion. Neck supple. No thyromegaly present.  Cardiovascular: Normal rate and regular rhythm.   Respiratory: Effort normal and breath sounds normal. No respiratory distress.  GI: Soft. Bowel sounds are normal. She exhibits no distension.  Musculoskeletal:  Edema LUE 1+  Neurological: She is alert.  Speech is dysarthric but intelligible. She does have a right gaze preference. She could provide her name, age as well as date of birth and follow simple commands. Left central 7 and tongue deviation. LUE.0/5 but does sense pain. LLE: 3+ to 4-/5 prox to distal. Fair insight and awareness.   Psychiatric:  flat    Results for orders placed or performed during the hospital encounter of 06/03/14 (from the past 24 hour(s))  Glucose, capillary     Status: None   Collection Time: 06/06/14  5:17 PM  Result Value Ref Range   Glucose-Capillary 99 70 - 99 mg/dL  Glucose, capillary     Status: None   Collection Time: 06/06/14  9:41 PM  Result Value Ref Range   Glucose-Capillary 87 70 - 99 mg/dL  Glucose, capillary     Status: None   Collection Time: 06/07/14  6:53 AM  Result Value Ref Range   Glucose-Capillary 86 70 - 99 mg/dL  Glucose, capillary     Status: None   Collection Time: 06/07/14 12:23 PM  Result Value Ref Range  Glucose-Capillary 71 70 - 99 mg/dL   Comment 1 Documented in Chart    No results found.  Assessment/Plan: Diagnosis: Right MCA infarct with left hemiparesis and inattention 1. Does the need for close, 24 hr/day medical supervision in concert with the patient's rehab  needs make it unreasonable for this patient to be served in a less intensive setting? Yes 2. Co-Morbidities requiring supervision/potential complications: htn, morbid obesity 3. Due to bladder management, bowel management, safety, skin/wound care, disease management, medication administration, pain management and patient education, does the patient require 24 hr/day rehab nursing? Yes 4. Does the patient require coordinated care of a physician, rehab nurse, PT (1-2 hrs/day, 5 days/week), OT (1-2 hrs/day, 5 days/week) and SLP (1-2 hrs/day, 5 days/week) to address physical and functional deficits in the context of the above medical diagnosis(es)? Yes Addressing deficits in the following areas: balance, endurance, locomotion, strength, transferring, bowel/bladder control, bathing, dressing, feeding, grooming, toileting, cognition, speech and psychosocial support 5. Can the patient actively participate in an intensive therapy program of at least 3 hrs of therapy per day at least 5 days per week? Yes 6. The potential for patient to make measurable gains while on inpatient rehab is excellent 7. Anticipated functional outcomes upon discharge from inpatient rehab are modified independent and supervision  with PT, modified independent, supervision and min assist with OT, modified independent with SLP. 8. Estimated rehab length of stay to reach the above functional goals is: 7-12 days 9. Does the patient have adequate social supports and living environment to accommodate these discharge functional goals? Yes 10. Anticipated D/C setting: Home 11. Anticipated post D/C treatments: HH therapy and Outpatient therapy 12. Overall Rehab/Functional Prognosis: excellent  RECOMMENDATIONS: This patient's condition is appropriate for continued rehabilitative care in the following setting: CIR Patient has agreed to participate in recommended program. Yes Note that insurance prior authorization may be required for  reimbursement for recommended care.  Comment: Rehab Admissions Coordinator to follow up.  Thanks,  Meredith Staggers, MD, Mellody Drown     06/07/2014

## 2014-06-07 NOTE — Progress Notes (Signed)
STROKE TEAM PROGRESS NOTE   HISTORY Mary Le is an 68 y.o. female who awakened normal today. Went to have an MRI and returned normal. She did complain of not feeling well and laid down for a nap. When her family went to awaken her they noted a left facial droop and left sided weakness. EMS was called at that time and the patient was brought in as a code stroke. Initial NIHSS of 18. Per family patient has complained of a headache for the past 3 days.  Date last known well: Date: 06/03/2014 Time last known well: Time: 13:30 tPA Given: No: Outside time window   SUBJECTIVE (INTERVAL HISTORY) No family is   at the bedside.  She is stable. BP stabilized now   TEE  showed normal LV function; No LAA thrombus; negative saline microcavitation .stud  . Loop recorder. placed today OBJECTIVE Temp:  [97.7 F (36.5 C)-98.9 F (37.2 C)] 98.9 F (37.2 C) (11/17 1340) Pulse Rate:  [47-89] 67 (11/17 1340) Cardiac Rhythm:  [-] Normal sinus rhythm (11/16 2030) Resp:  [8-18] 14 (11/17 1340) BP: (132-170)/(43-116) 134/58 mmHg (11/17 1340) SpO2:  [94 %-100 %] 97 % (11/17 1340)   Recent Labs Lab 06/06/14 1212 06/06/14 1717 06/06/14 2141 06/07/14 0653 06/07/14 1223  GLUCAP 97 99 87 86 71    Recent Labs Lab 06/03/14 1727  06/03/14 1735 06/04/14 0510 06/05/14 0210 06/06/14 0224  NA 134*  --  136* 138 138 142  K 7.8*  --  5.6* 4.2 4.2 4.0  CL 109  --  101 107 107 108  CO2  --   --  19 19 19 19   GLUCOSE 120*  --  136* 136* 130* 126*  BUN 42*  --  26* 17 10 15   CREATININE 1.10  --  1.16* 0.72 0.63 0.71  CALCIUM  --   < > 9.5 7.8* 8.3* 8.5  MG  --   --   --  1.9  --   --   PHOS  --   --   --  3.2  --   --   < > = values in this interval not displayed.  Recent Labs Lab 06/03/14 1735  AST 16  ALT 12  ALKPHOS 54  BILITOT 0.3  PROT 7.3  ALBUMIN 3.4*    Recent Labs Lab 06/03/14 1715 06/03/14 1727 06/04/14 0510  WBC 18.1*  --  15.8*  NEUTROABS 14.9*  --  13.0*  HGB 16.1*  18.0* 12.4  HCT 48.2* 53.0* 38.0  MCV 83.7  --  83.7  PLT 219  --  PLATELET CLUMPS NOTED ON SMEAR, COUNT APPEARS ADEQUATE    Recent Labs Lab 06/04/14 0030  TROPONINI <0.30   No results for input(s): LABPROT, INR in the last 72 hours. No results for input(s): COLORURINE, LABSPEC, [place], GLUCOSEU, HGBUR, BILIRUBINUR, KETONESUR, PROTEINUR, UROBILINOGEN, NITRITE, LEUKOCYTESUR in the last 72 hours.  Invalid input(s): APPERANCEUR     Component Value Date/Time   CHOL 219* 06/04/2014 0510   TRIG 163* 06/04/2014 0510   HDL 36* 06/04/2014 0510   CHOLHDL 6.1 06/04/2014 0510   VLDL 33 06/04/2014 0510   LDLCALC 150* 06/04/2014 0510   Lab Results  Component Value Date   HGBA1C 6.2* 06/04/2014      Component Value Date/Time   LABOPIA NONE DETECTED 06/03/2014 1842   COCAINSCRNUR NONE DETECTED 06/03/2014 1842   LABBENZ NONE DETECTED 06/03/2014 1842   AMPHETMU NONE DETECTED 06/03/2014 1842   THCU POSITIVE* 06/03/2014 1842  LABBARB NONE DETECTED 06/03/2014 1842     Recent Labs Lab 06/03/14 1828  ETH <11   I have personally reviewed the radiological images below and agree with the radiology interpretations.  Ct Angio Head and neck W/cm &/or Wo Cm  06/03/2014    IMPRESSION: 1. Distal right M1 occlusion. 2. Mild carotid siphon calcification without significant stenosis. 3. No cervical carotid stenosis. Question bilateral cervical ICA fibromuscular dysplasia. 4. Possible developing low density in the right temporal lobe, parietal lobe, and insula, which may reflect acute infarct.    Ct Head Wo Contrast  06/03/2014   IMPRESSION: Contrast staining within the RIGHT frontal lobe consistent with recent DSA, without CT findings of hemorrhage or, definite propagation of known RIGHT middle cerebral artery infarct.  Mildly hypodense mesial LEFT temporal lobe likely artifact though, MRI could be performed if there are referable symptoms.      06/03/2014   IMPRESSION: No acute abnormality  noted. 06/03/2014 17:35   Portable Chest Xray  06/03/2014  IMPRESSION: 1. Endotracheal tube with the tip 4.2 cm above the carina. 2. Left lower lobe airspace disease which may reflect atelectasis versus developing pneumonia.       LE venous doppler - No obvious evidence of DVT, superficial thrombosis, or Baker's cyst. Right common femoral vein not visualized due to bandages.  2D echo -Left ventricle: The cavity size was normal. Systolic function was normal. The estimated ejection fraction was in the range of 60% to 65%. Wall motion was normal; there were no regional wall motion abnormalities.  Mr [name] Wo Contrast  06/04/2014    IMPRESSION: Acute infarction of affecting 50-70% of the right MCA territory in the region of the insula, frontoparietal and high parietal region. The area of involvement includes the pre and postcentral gyri. Mild swelling but no shift. No hemorrhage at this time. The brain otherwise is normal.  MRA shows return of flow in the right middle cerebral artery territory. There is a luxury perfusion pattern in the region of infarction.      PHYSICAL EXAM  Temp:  [97.7 F (36.5 C)-98.9 F (37.2 C)] 98.9 F (37.2 C) (11/17 1340) Pulse Rate:  [47-89] 67 (11/17 1340) Resp:  [8-18] 14 (11/17 1340) BP: (132-170)/(43-116) 134/58 mmHg (11/17 1340) SpO2:  [94 %-100 %] 97 % (11/17 1340)  General - Well nourished, well developed middle aged african [place] lady,    Ophthalmologic - not cooperate on exam  Cardiovascular - Regular rate and rhythm with no murmur.  Mental Status -  Level of arousal and orientation to time, place, and person were intact. Language including expression, naming, repetition with simple sentences, comprehension was assessed and found intact, mild dysarthria.  Cranial Nerves II - XII - II - acuity adequate but  left homonymous hemianopsia III, IV, VI - Extraocular movements intact.right gaze preference but able to look all the way  to left V - Facial sensation intact bilaterally. VII - left facial droop VIII - Hearing & vestibular intact bilaterally. X - Palate elevates symmetrically, mild dysarthria. XI - Chin turning & shoulder shrug intact bilaterally. XII - Tongue protrusion intact.  Motor Strength - The patient's strength was LUE 0/5, LLE 4/5, RUE and RLE 5/5.  Bulk was normal and fasciculations were absent.   Motor Tone - Muscle tone was assessed at the neck and appendages and was normal.  Reflexes - The patient's reflexes were 1+ in all extremities and she had no pathological reflexes.  Sensory - Light touch, temperature/pinprick were  assessed and were asymmetrical and diminished on left.    Coordination - The patient had normal movements in right hand with no ataxia or dysmetria.  Tremor was absent.  Gait and Station - not able to test as she was just extubated.   ASSESSMENT/PLAN [name] is a 68 y.o. female with history of HTN, HLD presenting with left hemiplegia, left neglect and HH, right gaze. She did not receive IV t-PA due to out of window. CTA showed right M1 cut off and IR performed thrombectomy and revascularization of right M1.    Stroke:  Non-dominant right MCA infarct embolic secondary to unknown etiology  Resultant left hemiparesis, left hemianopsia, facial weakness and dysarthria  MRI  Right large MCA stroke  MRA  Luxury perfusion on the right hemisphere  CTA showed right M1 occlusion prior to IR  Carotid Doppler see CTA  2D Echo Left ventricle: The cavity size was normal. Systolic function was normal. The estimated ejection fraction was in the range of 60% to 65%. Wall motion was normal; there were no regional wall motion abnormalities.  LE DVT negative  LDL 150, not at the goal < 70  HgbA1c 6.2, on the goal <6.5   TEE  Today with potential loop recorder placement.    Heparin subq for VTE prophylaxis  Diet NPO for now, pending swallow evaluation   no  antithrombotics prior to admission, now on   po ASA    Ongoing aggressive risk factor management  Therapy recommendations:  pending  Disposition:  Pending  Respiratory failure -resolved doing well post extubation - pt currently room air - pending swallow  Hypertension  Home meds:   Lisinopril and metoprolol  Stable Permissive hypertension (OK if <220/120) for 24-48 hours post stroke and then gradually normalized within 5-7 days. Continue IVF for 24 hours to avoid hypotension   Hyperlipidemia  Home meds:  lovaza  LDL 150, not at goal < 70  No po access now  Allergy to statin  Start pravastatin   Other Stroke Risk Factors  Advanced age  CAD   Mobilize out of bed. PT/OT consults. Likely need inpatient rehab.  Resume home medicines. whwn able to swallow and  Start Pravastatin for lipids  Hospital day # 4 I have personally examined this patient, reviewed notes, independently viewed imaging studies, participated in medical decision making and plan of care. I have made any additions or clarifications directly to the above note.    Antony Contras, MD Medical Director [facility] Pager: [phone] 06/07/2014 2:13 PM             To contact Stroke Continuity provider, please refer to [email or web address] After hours, contact General Neurology

## 2014-06-07 NOTE — Progress Notes (Signed)
Pt in a procedure, will attempt f/u at next date.  Herbie [place], [place] CCC-SLP [phone]

## 2014-06-07 NOTE — CV Procedure (Signed)
See full TEE report in [employer]; normal LV function; No LAA thrombus; negative saline microcavitation study. Kirk Ruths

## 2014-06-07 NOTE — Evaluation (Signed)
Physical Therapy Evaluation Patient Details Name: Mary Le MRN: [id number] DOB: 1946-05-19 Today's Date: 06/07/2014   History of Present Illness   68 year old female admitted 11/131/5 due to left side weakness an headache. +Rt MCA near occlusion with Rt frontoparietal and insular CVA. 11/13 underwent clot retrieval and revascularization. Pt intubated 11/13-15/15. PMHx-HTN, back pain  Clinical Impression  Pt admitted with Rt MCA CVA with Lt hemiparesis. Pt following commands with slight impulsivity noted (although trying to get to [facility]). Pt currently with functional limitations due to the deficits listed below (see PT Problem List).  Pt will benefit from skilled PT to increase their independence and safety with mobility to allow discharge to the venue listed below.       Follow Up Recommendations CIR    Equipment Recommendations   (TBA)    Recommendations for Other Services Rehab consult;OT consult     Precautions / Restrictions Precautions Precautions: Fall Precaution Comments: Lt shoulder subluxation risk      Mobility  Bed Mobility Overal bed mobility: Needs Assistance Bed Mobility: Rolling;Sidelying to Sit;Sit to Sidelying Rolling: Max assist (to Rt) Sidelying to sit: Mod assist     Sit to sidelying: Mod assist General bed mobility comments: trying to rise from supine with inability to problem solve alternative method; physical assist/facilitation  Transfers Overall transfer level: Needs assistance Equipment used: None Transfers: Sit to/from [facility] Sit to Stand: Min assist Stand pivot transfers: Min assist       General transfer comment: pivot Rt and Lt to [facility]  Ambulation/Gait Ambulation/Gait assistance: Min assist Ambulation Distance (Feet): 1 Feet Assistive device: None       General Gait Details: side step to Lt to [facility]  Stairs            Wheelchair Mobility    Modified Rankin (Stroke Patients Only) Modified Rankin  (Stroke Patients Only) Pre-Morbid Rankin Score: No symptoms Modified Rankin: Moderately severe disability     Balance Overall balance assessment: Needs assistance Sitting-balance support: Single extremity supported;Feet unsupported Sitting balance-Leahy Scale: Poor Sitting balance - Comments: leans to Lt Postural control: Left lateral lean Standing balance support: No upper extremity supported Standing balance-Leahy Scale: Fair                               Pertinent Vitals/Pain Pain Assessment: No/denies pain    Home Living Family/patient expects to be discharged to:: Private residence Living Arrangements: Spouse/significant other;Children;Other relatives (daughter, grandaughter) Available Help at Discharge: Family;Available 24 hours/day (can provide minguard/supervision) Type of Home: House Home Access: Stairs to enter Entrance Stairs-Rails: None Entrance Stairs-Number of Steps: 1 (back entrance) Home Layout: Two level;Able to live on main level with bedroom/bathroom Home Equipment: Shower seat - built in Additional Comments: spouse had mild CVA earlier this year, daughter with breast cancer (has port and cannot lift)    Prior Function Level of Independence: Independent               Hand Dominance   Dominant Hand: Right    Extremity/Trunk Assessment   Upper Extremity Assessment: Defer to OT evaluation;LUE deficits/detail (min shoulder shrug)           Lower Extremity Assessment: LLE deficits/detail   LLE Deficits / Details: AAROM WFL (?more limited by neglect than weakness); knee extension 4/5, dorsiflexion 4/5  Cervical / Trunk Assessment: Other exceptions  Communication   Communication: Expressive difficulties  Cognition Arousal/Alertness: Awake/alert Behavior During Therapy:  Flat affect;Impulsive Overall Cognitive Status: Impaired/Different from baseline Area of Impairment: Orientation;Attention;Safety/judgement;Awareness;Problem  solving Orientation Level: Place (initially thought she was at home; then able to state [facility]) Current Attention Level: Sustained Memory: Decreased short-term memory   Safety/Judgement: Decreased awareness of safety;Decreased awareness of deficits Awareness: Intellectual Problem Solving: Slow processing;Difficulty sequencing;Requires verbal cues;Requires tactile cues General Comments: impulsive once sitting EOB wanting to get to [facility]    General Comments General comments (skin integrity, edema, etc.): Daughter and grandaughter present throughout     Exercises        Assessment/Plan    PT Assessment Patient needs continued PT services  PT Diagnosis Hemiplegia non-dominant side   PT Problem List Decreased strength;Decreased activity tolerance;Decreased balance;Decreased mobility;Decreased cognition;Decreased knowledge of use of DME;Decreased safety awareness;Decreased knowledge of precautions;Impaired sensation;Impaired tone;Obesity  PT Treatment Interventions DME instruction;Gait training;Functional mobility training;Therapeutic activities;Balance training;Neuromuscular re-education;Cognitive remediation;Patient/family education   PT Goals (Current goals can be found in the Care Plan section) Acute Rehab PT Goals Patient Stated Goal: return to walking PT Goal Formulation: With patient Time For Goal Achievement: 06/14/14 Potential to Achieve Goals: Good    Frequency Min 4X/week   Barriers to discharge        Co-evaluation               End of Session Equipment Utilized During Treatment: Gait belt Activity Tolerance: Patient limited by fatigue Patient left: in bed;with nursing/sitter in room;with family/visitor present (preparing to go for test) Nurse Communication: Mobility status         Time: 0832-0906 PT Time Calculation (min) (ACUTE ONLY): 34 min   Charges:   PT Evaluation $Initial PT Evaluation Tier I: 1 Procedure PT Treatments $Neuromuscular Re-education:  23-37 mins   PT G Codes:          Mariea Mcmartin 27-Jun-2014, 9:28 AM Pager [phone]

## 2014-06-08 ENCOUNTER — Encounter (HOSPITAL_COMMUNITY): Payer: Self-pay | Admitting: Cardiology

## 2014-06-08 ENCOUNTER — Inpatient Hospital Stay (HOSPITAL_COMMUNITY)
Admission: RE | Admit: 2014-06-08 | Discharge: 2014-06-23 | DRG: 056 | Disposition: A | Discharge status: Home Health | Payer: Medicare Other | Source: Intra-hospital | Attending: Physical Medicine & Rehabilitation | Admitting: Physical Medicine & Rehabilitation

## 2014-06-08 DIAGNOSIS — I69898 Other sequelae of other cerebrovascular disease: Secondary | ICD-10-CM | POA: Diagnosis not present

## 2014-06-08 DIAGNOSIS — M47816 Spondylosis without myelopathy or radiculopathy, lumbar region: Secondary | ICD-10-CM | POA: Diagnosis not present

## 2014-06-08 DIAGNOSIS — I63519 Cerebral infarction due to unspecified occlusion or stenosis of unspecified middle cerebral artery: Secondary | ICD-10-CM | POA: Diagnosis not present

## 2014-06-08 DIAGNOSIS — R1310 Dysphagia, unspecified: Secondary | ICD-10-CM | POA: Diagnosis present

## 2014-06-08 DIAGNOSIS — I959 Hypotension, unspecified: Secondary | ICD-10-CM | POA: Diagnosis present

## 2014-06-08 DIAGNOSIS — G8194 Hemiplegia, unspecified affecting left nondominant side: Secondary | ICD-10-CM | POA: Diagnosis present

## 2014-06-08 DIAGNOSIS — R252 Cramp and spasm: Secondary | ICD-10-CM | POA: Diagnosis present

## 2014-06-08 DIAGNOSIS — K5900 Constipation, unspecified: Secondary | ICD-10-CM | POA: Diagnosis not present

## 2014-06-08 DIAGNOSIS — R414 Neurologic neglect syndrome: Secondary | ICD-10-CM | POA: Diagnosis not present

## 2014-06-08 DIAGNOSIS — G8100 Flaccid hemiplegia affecting unspecified side: Secondary | ICD-10-CM | POA: Diagnosis not present

## 2014-06-08 DIAGNOSIS — I63511 Cerebral infarction due to unspecified occlusion or stenosis of right middle cerebral artery: Secondary | ICD-10-CM | POA: Diagnosis present

## 2014-06-08 DIAGNOSIS — I10 Essential (primary) hypertension: Secondary | ICD-10-CM | POA: Diagnosis present

## 2014-06-08 DIAGNOSIS — E785 Hyperlipidemia, unspecified: Secondary | ICD-10-CM | POA: Diagnosis present

## 2014-06-08 DIAGNOSIS — G8190 Hemiplegia, unspecified affecting unspecified side: Secondary | ICD-10-CM | POA: Diagnosis not present

## 2014-06-08 DIAGNOSIS — M549 Dorsalgia, unspecified: Secondary | ICD-10-CM | POA: Diagnosis not present

## 2014-06-08 DIAGNOSIS — R208 Other disturbances of skin sensation: Secondary | ICD-10-CM | POA: Diagnosis not present

## 2014-06-08 DIAGNOSIS — G8110 Spastic hemiplegia affecting unspecified side: Secondary | ICD-10-CM | POA: Diagnosis not present

## 2014-06-08 DIAGNOSIS — I63311 Cerebral infarction due to thrombosis of right middle cerebral artery: Secondary | ICD-10-CM | POA: Diagnosis present

## 2014-06-08 DIAGNOSIS — K5901 Slow transit constipation: Secondary | ICD-10-CM | POA: Diagnosis not present

## 2014-06-08 LAB — GLUCOSE, CAPILLARY
Glucose-Capillary: 86 mg/dL (ref 70–99)
Glucose-Capillary: 92 mg/dL (ref 70–99)
Glucose-Capillary: 98 mg/dL (ref 70–99)

## 2014-06-08 MED ORDER — BISACODYL 10 MG RE SUPP
10.0000 mg | Freq: Every day | RECTAL | Status: DC | PRN
  Filled 2014-06-08: qty 1

## 2014-06-08 MED ORDER — GUAIFENESIN-DM 100-10 MG/5ML PO SYRP
5.0000 mL | ORAL_SOLUTION | Freq: Four times a day (QID) | ORAL | Status: DC | PRN
  Administered 2014-06-09 – 2014-06-21 (×10): 10 mL via ORAL
  Filled 2014-06-08 (×11): qty 10

## 2014-06-08 MED ORDER — TRAZODONE HCL 50 MG PO TABS
25.0000 mg | ORAL_TABLET | Freq: Every evening | ORAL | Status: DC | PRN
  Administered 2014-06-13 – 2014-06-15 (×2): 50 mg via ORAL
  Filled 2014-06-08 (×2): qty 1

## 2014-06-08 MED ORDER — PROCHLORPERAZINE EDISYLATE 5 MG/ML IJ SOLN
5.0000 mg | Freq: Four times a day (QID) | INTRAMUSCULAR | Status: DC | PRN
  Filled 2014-06-08: qty 2

## 2014-06-08 MED ORDER — ENOXAPARIN SODIUM 40 MG/0.4ML ~~LOC~~ SOLN
40.0000 mg | SUBCUTANEOUS | Status: DC
  Administered 2014-06-08 – 2014-06-22 (×15): 40 mg via SUBCUTANEOUS
  Filled 2014-06-08 (×16): qty 0.4

## 2014-06-08 MED ORDER — ADULT MULTIVITAMIN W/MINERALS CH
1.0000 | ORAL_TABLET | Freq: Every day | ORAL | Status: DC
  Administered 2014-06-09 – 2014-06-15 (×7): 1 via ORAL
  Filled 2014-06-08 (×8): qty 1

## 2014-06-08 MED ORDER — ALUM & MAG HYDROXIDE-SIMETH 200-200-20 MG/5ML PO SUSP: 30.0000 mL | ORAL | Status: DC | PRN

## 2014-06-08 MED ORDER — METOPROLOL SUCCINATE ER 25 MG PO TB24
25.0000 mg | ORAL_TABLET | Freq: Every day | ORAL | Status: DC
  Administered 2014-06-09: 25 mg via ORAL
  Filled 2014-06-08 (×2): qty 1

## 2014-06-08 MED ORDER — FLEET ENEMA 7-19 GM/118ML RE ENEM: 1.0000 | ENEMA | Freq: Once | RECTAL | Status: AC | PRN

## 2014-06-08 MED ORDER — FAMOTIDINE 10 MG PO TABS
10.0000 mg | ORAL_TABLET | Freq: Two times a day (BID) | ORAL | Status: DC
  Administered 2014-06-08 – 2014-06-23 (×30): 10 mg via ORAL
  Filled 2014-06-08 (×33): qty 1

## 2014-06-08 MED ORDER — ACETAMINOPHEN 325 MG PO TABS
325.0000 mg | ORAL_TABLET | ORAL | Status: DC | PRN
  Administered 2014-06-09 – 2014-06-11 (×2): 650 mg via ORAL
  Filled 2014-06-08 (×2): qty 2

## 2014-06-08 MED ORDER — ACETAMINOPHEN 500 MG PO TABS
1000.0000 mg | ORAL_TABLET | Freq: Once | ORAL | Status: DC
  Filled 2014-06-08 (×3): qty 2

## 2014-06-08 MED ORDER — CLOPIDOGREL BISULFATE 75 MG PO TABS
75.0000 mg | ORAL_TABLET | Freq: Every day | ORAL | Status: DC
  Administered 2014-06-09 – 2014-06-23 (×15): 75 mg via ORAL
  Filled 2014-06-08 (×16): qty 1

## 2014-06-08 MED ORDER — PROCHLORPERAZINE 25 MG RE SUPP
12.5000 mg | Freq: Four times a day (QID) | RECTAL | Status: DC | PRN
  Filled 2014-06-08: qty 1

## 2014-06-08 MED ORDER — PROCHLORPERAZINE MALEATE 5 MG PO TABS
5.0000 mg | ORAL_TABLET | Freq: Four times a day (QID) | ORAL | Status: DC | PRN
  Filled 2014-06-08: qty 2

## 2014-06-08 MED ORDER — OXYCODONE HCL 5 MG PO TABS
5.0000 mg | ORAL_TABLET | ORAL | Status: DC | PRN
  Administered 2014-06-09 – 2014-06-15 (×6): 10 mg via ORAL
  Administered 2014-06-17 – 2014-06-19 (×3): 5 mg via ORAL
  Filled 2014-06-08: qty 2
  Filled 2014-06-08: qty 1
  Filled 2014-06-08 (×2): qty 2
  Filled 2014-06-08: qty 1
  Filled 2014-06-08: qty 2
  Filled 2014-06-08: qty 1
  Filled 2014-06-08 (×3): qty 2

## 2014-06-08 MED ORDER — METHOCARBAMOL 500 MG PO TABS
500.0000 mg | ORAL_TABLET | Freq: Four times a day (QID) | ORAL | Status: DC | PRN
  Administered 2014-06-17 – 2014-06-22 (×3): 500 mg via ORAL
  Filled 2014-06-08 (×3): qty 1

## 2014-06-08 MED ORDER — SENNOSIDES-DOCUSATE SODIUM 8.6-50 MG PO TABS
1.0000 | ORAL_TABLET | Freq: Every evening | ORAL | Status: DC | PRN
  Administered 2014-06-11 – 2014-06-12 (×2): 1 via ORAL
  Filled 2014-06-08: qty 1

## 2014-06-08 MED ORDER — LISINOPRIL 20 MG PO TABS
20.0000 mg | ORAL_TABLET | Freq: Every day | ORAL | Status: DC
  Administered 2014-06-09 – 2014-06-14 (×4): 20 mg via ORAL
  Filled 2014-06-08 (×7): qty 1

## 2014-06-08 NOTE — Plan of Care (Signed)
Problem: Discharge/Transitional Outcomes Goal: PCP appointment made and transportation plan in place Outcome: Not Applicable Date Met:  59/74/16

## 2014-06-08 NOTE — Plan of Care (Signed)
Problem: Discharge/Transitional Outcomes Goal: INR monitor plan established Outcome: Not Applicable Date Met:  46/80/32

## 2014-06-08 NOTE — Plan of Care (Signed)
Problem: Discharge/Transitional Outcomes Goal: Ability to attain medications upon leaving hospital Outcome: Completed/Met Date Met:  06/08/14     

## 2014-06-08 NOTE — Plan of Care (Signed)
Problem: Discharge/Transitional Outcomes Goal: Independent mobility/functioning independent or with min Independent mobility/functioning independently or with minimal assistance  Outcome: Completed/Met Date Met:  06/08/14

## 2014-06-08 NOTE — H&P (Signed)
Physical Medicine and Rehabilitation Admission H&P   Chief Complaint  Patient presents with  . Left facial droop and left sided weakness   HPI: Mary Le is a 68 y.o. RH female with h/o HTN, morbid obesity, dyslipidemia who was admitted on 06/03/2014 with left-sided weakness. Cranial CT scan negative and patient underwent CTA head/neck which revealed distal right M1 occlusion with question of low density right temporal, parietal lobe and insula. she underwent endovacular revascularization of R-MCA with thrombectomy by Dr. Estanislado Pandy. MRI of the brain shows acute infarct right MCA territory in the region of the insula, frontoparietal and high parietal region. Patient intubated thorough 11/15 due to acute respiratory failure. MRA of the brain with no stenosis or aneurysm and return of flow in R-MCA territory with luxury perfusion. 2D echo showed EF of 65% and no wall motion abnormalities. Carotid Dopplers without significant ICA stenosis. BLE dopplers negative for DVT. TEE with normal LVF, no LAA and negative for PFO. Loop recorder was placed by Dr. [name] on 11/14. BSE done and patient started on dysphagia 2 with thin liquids. Patient with resultant L-HH, right gaze preference, left facial droop with mild dysarthria as well as right hemiparesis. Dr. Leonie Man recommended ASA for embolic infarct due to unknown etiology. Therapy ongoing and CIR recommended by MD and rehab team.     Review of Systems  HENT: Negative for hearing loss.  Eyes: Negative for blurred vision and double vision.  Respiratory: Negative for cough, sputum production and shortness of breath.  Cardiovascular: Negative for chest pain and palpitations.  Gastrointestinal: Negative for heartburn, nausea and constipation.  Genitourinary: Negative for urgency and frequency.  Musculoskeletal: Negative for myalgias, back pain and joint pain.  Neurological: Positive for sensory change, speech change, focal weakness and  headaches (constant). Negative for dizziness and tingling.  Psychiatric/Behavioral: The patient is not nervous/anxious and does not have insomnia.     Past Medical History  Diagnosis Date  . Hypertension   . Hypercholesteremia   . Back pain     arthritis   Past Surgical History  Procedure Laterality Date  . Appendectomy    . Tonsillectomy    . Hemorroidectomy    . Tubal ligation    . Radiology with anesthesia N/A 06/03/2014    Procedure: RADIOLOGY WITH ANESTHESIA; Surgeon: [name], MD; Location: [facility]; Service: Radiology; Laterality: N/A;  . Tee without cardioversion N/A 06/07/2014    Procedure: TRANSESOPHAGEAL ECHOCARDIOGRAM (TEE); Surgeon: [name], MD; Location: [facility] ENDOSCOPY; Service: Cardiovascular; Laterality: N/A;   Family History  Problem Relation Age of Onset  . Coronary artery disease Mother 32    MI  . Kidney disease Father     Social History: Married. Retired--managed a Loss adjuster, [employer]. Daughter with CA lives with them and patient was helping her out. Per reports that she has quit smoking. She has never used smokeless tobacco. She reports that she drinks alcohol couple times a week. She reports that she does not use illicit drugs.    Allergies  Allergen Reactions  . Aspirin Nausea And Vomiting  . Statins Other (See Comments)    Myalgias and chest pain (has tried Lipitor and Pravachol)   Medications Prior to Admission  Medication Sig Dispense Refill  . acetaminophen (TYLENOL) 325 MG tablet Take 650 mg by mouth every 6 (six) hours as needed (pain).     . B Complex Vitamins (B COMPLEX-B12 PO) Take 1 tablet by mouth daily.     . fish oil-omega-3 fatty  acids 1000 MG capsule Take 1 g by mouth daily.     Marland Kitchen lisinopril (PRINIVIL,ZESTRIL) 20 MG tablet Take 1 tablet (20 mg total) by mouth daily. 90 tablet 0  . loratadine (ALAVERT) 10 MG  tablet Take 10 mg by mouth daily as needed for allergies.    . metoprolol succinate (TOPROL-XL) 25 MG 24 hr tablet Take 1 tablet (25 mg total) by mouth daily. 90 tablet 0  . Multiple Vitamin (MULTIVITAMIN WITH MINERALS) TABS tablet Take 1 tablet by mouth daily.    Vladimir Faster Glycol-Propyl Glycol (SYSTANE OP) Place 1 drop into both eyes daily as needed (dry eyeys).      Home: Home Living Family/patient expects to be discharged to:: Private residence Living Arrangements: Spouse/significant other, Children, Other relatives (daughter, Curator) Available Help at Discharge: Family, Available 24 hours/day (can provide minguard/supervision) Type of Home: House Home Access: Stairs to enter [employer] of Steps: 1 (back entrance) Entrance Stairs-Rails: None Home Layout: Two level, Able to live on main level with bedroom/bathroom Alternate Level Stairs-Number of Steps: 3 (does not have to use these) Home Equipment: Shower seat - built in Additional Comments: spouse had mild CVA earlier this year, daughter with breast cancer (has port and cannot lift)  Functional History: Prior Function Level of Independence: Independent  Functional Status:  Mobility: Bed Mobility Overal bed mobility: Needs Assistance Bed Mobility: Rolling, Sidelying to Sit, Sit to Sidelying Rolling: Max assist (to Rt) Sidelying to sit: Mod assist Sit to sidelying: Mod assist General bed mobility comments: trying to rise from supine with inability to problem solve alternative method; physical assist/facilitation Transfers Overall transfer level: Needs assistance Equipment used: None Transfers: Sit to/from Stand, Stand Pivot Transfers Sit to Stand: Min assist Stand pivot transfers: Min assist General transfer comment: pivot Rt and Lt to [facility] Ambulation/Gait Ambulation/Gait assistance: Min assist Ambulation Distance (Feet): 1 Feet Assistive device: None General Gait Details: side  step to Lt to [facility]    ADL: mod to max assist    Cognition: Cognition Overall Cognitive Status: Impaired/Different from baseline Arousal/Alertness: Awake/alert Orientation Level: Oriented X4 Attention: Focused, Sustained Focused Attention: Appears intact Sustained Attention: Appears intact Memory: Appears intact Awareness: Appears intact Problem Solving: Appears intact Executive Function: Sequencing, Armed forces logistics/support/administrative officer, Initiating Sequencing: Appears intact Organizing: Appears intact Initiating: Appears intact Safety/Judgment: Impaired Cognition Arousal/Alertness: Awake/alert Behavior During Therapy: Flat affect, Impulsive Overall Cognitive Status: Impaired/Different from baseline Area of Impairment: Orientation, Attention, Safety/judgement, Awareness, Problem solving Orientation Level: Place (initially thought she was at home; then able to state [facility]) Current Attention Level: Sustained Memory: Decreased short-term memory Safety/Judgement: Decreased awareness of safety, Decreased awareness of deficits Awareness: Intellectual Problem Solving: Slow processing, Difficulty sequencing, Requires verbal cues, Requires tactile cues General Comments: impulsive once sitting EOB wanting to get to [facility]  Physical Exam: Blood pressure 156/56, pulse 68, temperature 98.6 F (37 C), temperature source Oral, resp. rate 16, height 5\' 7"  (1.702 m), weight 103 kg (227 lb 1.2 oz), SpO2 98 %. Physical Exam Constitutional: She appears well-developed.  obese  HENT: oral mucosa pink and moist Atraumatic. Normocephalic. Left facial droop  Eyes:  Pupils round and reactive to light.  Neck: Normal range of motion. Neck supple. No thyromegaly present.  Cardiovascular: Normal rate and regular rhythm.  Respiratory: Effort normal and breath sounds normal. No respiratory distress.  GI: Soft. Bowel sounds are normal. She exhibits no distension.  Musculoskeletal:  Edema LUE 1+, trace to 1+ Neurological: She  is alert.  Speech is dysarthric, low volume.  She has a right gaze preference. Had difficulty moving eyes to left field. She could provide her name, age as well as date of birth and follow simple commands. Left central 7 and tongue deviation. LUE.0/5 but does sense pain stim.Marland Kitchen LLE: 3 hf, 3 to 3+ KE, and 4-/5 at ankle with sensory deficits. Fair insight and awareness. delayed processing. Psychiatric:  flat     Lab Results Last 48 Hours    Results for orders placed or performed during the hospital encounter of 06/03/14 (from the past 48 hour(s))  Glucose, capillary Status: None   Collection Time: 06/06/14 9:39 AM  Result Value Ref Range   Glucose-Capillary 97 70 - 99 mg/dL  Glucose, capillary Status: None   Collection Time: 06/06/14 12:12 PM  Result Value Ref Range   Glucose-Capillary 97 70 - 99 mg/dL   Comment 1 Notify RN    Comment 2 Documented in Chart   Glucose, capillary Status: None   Collection Time: 06/06/14 5:17 PM  Result Value Ref Range   Glucose-Capillary 99 70 - 99 mg/dL  Glucose, capillary Status: None   Collection Time: 06/06/14 9:41 PM  Result Value Ref Range   Glucose-Capillary 87 70 - 99 mg/dL  Glucose, capillary Status: None   Collection Time: 06/07/14 6:53 AM  Result Value Ref Range   Glucose-Capillary 86 70 - 99 mg/dL  Glucose, capillary Status: None   Collection Time: 06/07/14 12:23 PM  Result Value Ref Range   Glucose-Capillary 71 70 - 99 mg/dL   Comment 1 Documented in Chart   Glucose, capillary Status: Abnormal   Collection Time: 06/07/14 4:14 PM  Result Value Ref Range   Glucose-Capillary 104 (H) 70 - 99 mg/dL   Comment 1 Notify RN   Glucose, capillary Status: None   Collection Time: 06/07/14 10:37 PM  Result Value Ref Range   Glucose-Capillary 88 70 - 99 mg/dL   Comment 1 Notify RN    Comment 2 Documented  in Chart   Glucose, capillary Status: None   Collection Time: 06/08/14 7:55 AM  Result Value Ref Range   Glucose-Capillary 92 70 - 99 mg/dL   Comment 1 Notify RN       Imaging Results (Last 48 hours)    No results found.       Medical Problem List and Plan: 1. Functional deficits secondary to R-MCA infarct --patient and husband report intolerance of ASA due to severe dyspepsia. Discussed with Neuro NP and will change to plavix.  2. DVT Prophylaxis/Anticoagulation: Pharmaceutical: Lovenox 3. Headaches/ Pain Management: Has ben getting IV fentanyl once daily and reports poor results with tylenol. Will try oxycodone prn. May need to add Topamax for more consistent relief.  4. Mood: LCSW to follow for evaluation and support.  5. Neuropsych: This patient is capable of making decisions on her own behalf. 6. Skin/Wound Care: Routine pressure relief measures.  7. Fluids/Electrolytes/Nutrition: Monitor I/O especially as on dysphagia diet. Check lytes in am. 8. HTN: Monitor BP every 8 hours. Continue metoprolol and lisinopril daily. Avoid hypotension.  9. Dyslipidemia: intolerant of statins.      Post Admission Physician Evaluation: 1. Functional deficits secondary to R-MCA infarct with Left hemiparesis and L-HH 2. Patient is admitted to receive collaborative, interdisciplinary care between the physiatrist, rehab nursing staff, and therapy team. 3. Patient's level of medical complexity and substantial therapy needs in context of that medical necessity cannot be provided at a lesser intensity of care such as a SNF. 4. Patient has experienced substantial  functional loss from his/her baseline which was documented above under the "Functional History" and "Functional Status" headings. Judging by the patient's diagnosis, physical exam, and functional history, the patient has potential for functional progress which will result in measurable gains while on inpatient  rehab. These gains will be of substantial and practical use upon discharge in facilitating mobility and self-care at the household level. 5. Physiatrist will provide 24 hour management of medical needs as well as oversight of the therapy plan/treatment and provide guidance as appropriate regarding the interaction of the two. 6. 24 hour rehab nursing will assist with bladder management, bowel management, safety, skin/wound care, disease management, medication administration, pain management and patient education and help integrate therapy concepts, techniques,education, etc. 7. PT will assess and treat for/with: Lower extremity strength, range of motion, stamina, balance, functional mobility, safety, adaptive techniques and equipment, NMR, visual spatial and cognitive awareness, family ed, ego support. Goals are: supervision. 8. OT will assess and treat for/with: ADL's, functional mobility, safety, upper extremity strength, adaptive techniques and equipment, NMR, visual perceptual awareness, community reintegration, leisure awareness. Goals are: supervision to min assist. Therapy may proceed with showering this patient. 9. SLP will assess and treat for/with: cognition, communication. Goals are: supervision to mod I. 10. Case Management and Social Worker will assess and treat for psychological issues and discharge planning. 11. Team conference will be held weekly to assess progress toward goals and to determine barriers to discharge. 12. Patient will receive at least 3 hours of therapy per day at least 5 days per week. 13. ELOS: 14-18 days  14. Prognosis: excellent     Meredith Staggers, MD, [facility] 06/08/2014

## 2014-06-08 NOTE — PMR Pre-admission (Signed)
PMR Admission Coordinator Pre-Admission Assessment  Patient: Mary Le is an 68 y.o., female MRN: [id number] DOB: 1945-10-14 Height: 5\' 7"  (170.2 cm) Weight: 103 kg (227 lb 1.2 oz)              Insurance Information HMO: No    PPO:       PCP:       IPA:       80/20:       OTHER:   PRIMARY: Medicare A/B      Policy#: [id number]      Subscriber: [place] Name:        Phone#:       Fax#:   Pre-Cert#:        Employer: Not employed Benefits:  Phone #:       Name: Automated Eff. Date: 09/19/12     Deduct: $1260      Out of Pocket Max: none      Life Max: unlimited CIR: 100%      SNF: 100 days Outpatient: 80%     Co-Pay: 20% Home Health: 100%      Co-Pay: none DME: 80%     Co-Pay: 20% Providers: patient's choice  SECONDARY: [employer]      Policy#: [id number]      Subscriber: Merlyn Albert CM Name:        Phone#:       Fax#:   Pre-Cert#:        Employer:  Not employed Benefits:  Phone #: [phone]     Name:   Eff. Date:       Deduct:        Out of Pocket Max:        Life Max:   CIR:        SNF:   Outpatient:       Co-Pay:   Home Health:        Co-Pay:   DME:       Co-Pay:     Emergency Contact Information Contact Information    Name Relation Home Work Mobile   Faivre,Robert Spouse [phone]     Marotto,[name] Daughter   [phone]   Abrea, Henle   [phone]     Current Medical History  Patient Admitting Diagnosis:  R MCA infarct  History of Present Illness:  A 68 y.o.right hand female with history of hypertension. Independent prior to admission living with family and assistance as needed.Presented 06/03/2014 left-sided weakness. Initial cranial CT scan negative.MRI of the brain shows acute infarct right MCA territory in the region of the insula, frontoparietal and high parietal region. MRA of the brain with no stenosis or aneurysm.echocardiogram with ejection fraction of 65% no wall motion abnormalities.Carotid Dopplers with no ICA stenosis.CTA and geode of  the head and neck with distal right M1 occlusion.Lower extremity Dopplers no signs of DVT.Patient did not receive TPA. Patient underwent clot retrieval revascularization 06/03/2014 per interventional radiology. Placed on aspirin per neurology services for CVA prophylaxis and subcutaneous heparin for DVT prophylaxis.TEE completed 06/07/2014 with no thrombus normal LV function and underwent implant of loop recorder per Dr. [name]. Dysphagia 2 thin liquid diet. Physical therapy evaluation completed 06/07/2014 with recommendations of physical medicine rehabilitation consult.   Total: 11=NIH  Past Medical History  Past Medical History  Diagnosis Date  . Hypertension   . Hypercholesteremia   . Back pain     arthritis    Family History  family history includes Coronary artery disease (age of onset:  48) in her mother; Kidney disease in her father.  Prior Rehab/Hospitalizations:  Had rehab for her back 6 months ago as an outpatient 1 X a week.   Current Medications  Current facility-administered medications: acetaminophen (TYLENOL) tablet 1,000 mg, 1,000 mg, Oral, Once, Antony Contras, MD;  antiseptic oral rinse (CPC / CETYLPYRIDINIUM CHLORIDE 0.05%) solution 7 mL, 7 mL, Mouth Rinse, BID, Juanito Doom, MD, 7 mL at 06/08/14 1253;  aspirin suppository 300 mg, 300 mg, Rectal, Daily, Antony Contras, MD, 300 mg at 06/08/14 1253 heparin injection 5,000 Units, 5,000 Units, Subcutaneous, 3 times per day, Luz Brazen, MD, 5,000 Units at 06/08/14 0636;  insulin aspart (novoLOG) injection 0-9 Units, 0-9 Units, Subcutaneous, TID WC, Luz Brazen, MD, Stopped at 06/08/14 [phone];  lisinopril (PRINIVIL,ZESTRIL) tablet 20 mg, 20 mg, Oral, Daily, Antony Contras, MD, 20 mg at 06/08/14 4270 metoprolol succinate (TOPROL-XL) 24 hr tablet 25 mg, 25 mg, Oral, Daily, Antony Contras, MD, 25 mg at 06/08/14 6237;  multivitamin with minerals tablet 1 tablet, 1 tablet, Oral, Daily, Antony Contras, MD, 1 tablet at 06/08/14 6283;   senna-docusate (Senokot-S) tablet 1 tablet, 1 tablet, Oral, QHS PRN, Alexis Goodell, MD  Patients Current Diet: DIET DYS 2  Precautions / Restrictions Precautions Precautions: Fall Precaution Comments: Lt shoulder subluxation risk Restrictions Weight Bearing Restrictions: No   Prior Activity Level Community (5-7x/wk): Went out daily up until 1 week ago.  Was taking daughter to radiation therapy appointments.  Was driving.   Home Assistive Devices / Equipment Home Equipment: Shower seat - built in  Prior Functional Level Prior Function Level of Independence: Independent  Current Functional Level Cognition  Arousal/Alertness: Awake/alert Overall Cognitive Status: Impaired/Different from baseline Current Attention Level: Sustained Orientation Level: Oriented X4 Safety/Judgement: Decreased awareness of safety, Decreased awareness of deficits General Comments: impulsive once sitting EOB wanting to get to [facility] Attention: Focused, Sustained Focused Attention: Appears intact Sustained Attention: Appears intact Memory: Appears intact Awareness: Appears intact Problem Solving: Appears intact Executive Function: Sequencing, Armed forces logistics/support/administrative officer, Initiating Sequencing: Appears intact Organizing: Appears intact Initiating: Appears intact Safety/Judgment: Impaired    Extremity Assessment (includes Sensation/Coordination)  Upper Extremity Assessment: Defer to OT evaluation;LUE deficits/detail (min shoulder shrug) Lower Extremity Assessment: LLE deficits/detail LLE Deficits / Details: AAROM WFL (?more limited by neglect than weakness); knee extension 4/5, dorsiflexion 4/5 Cervical / Trunk Assessment: Other exceptions     ADLs  Anticipate ADL needs    Mobility  Overal bed mobility: Needs Assistance Bed Mobility: Rolling, Sidelying to Sit, Sit to Sidelying Rolling: Max assist (to Rt) Sidelying to sit: Mod assist Sit to sidelying: Mod assist General bed mobility comments: trying to rise  from supine with inability to problem solve alternative method; physical assist/facilitation    Transfers  Overall transfer level: Needs assistance Equipment used: None Transfers: Sit to/from Stand, Stand Pivot Transfers Sit to Stand: Min assist Stand pivot transfers: Min assist General transfer comment: pivot Rt and Lt to [facility]    Ambulation / Gait / Stairs / Wheelchair Mobility  Ambulation/Gait Ambulation/Gait assistance: Museum/gallery curator (Feet): 1 Feet Assistive device: None General Gait Details: side step to Lt to [facility]    Posture / Balance Dynamic Sitting Balance Sitting balance - Comments: leans to Lt    Special needs/care consideration BiPAP/CPAP No CPM No Continuous Drip IV No Dialysis No       Life Vest No Oxygen No Special Bed No Trach Size No Wound Vac (area) No     Skin No  Bowel mgmt: Last BM per daughter was 06/05/14 Bladder mgmt: Voiding up on [facility] with some stress incontinence Diabetic mgmt No    Previous Home Environment Living Arrangements: Spouse/significant other, Children, Other relatives (daughter, grandaughter) Available Help at Discharge: Family, Available 24 hours/day (can provide minguard/supervision) Type of Home: House Home Layout: Two level, Able to live on main level with bedroom/bathroom Alternate Level Stairs-Number of Steps: 3 (does not have to use these) Home Access: Stairs to enter Entrance Stairs-Rails: None Entrance Stairs-Number of Steps: 1 (back entrance) Home Care Services: No Additional Comments: spouse had mild CVA earlier this year, daughter with breast cancer (has port and cannot lift)  Discharge Living Setting Plans for Discharge Living Setting: Patient's home, House, Lives with (comment) (Lives with husband, daughter and granddaughter.) Type of Home at Discharge: House Discharge Home Layout: Two level Alternate Level Stairs-Number of Steps: 1 step to den and 2 steps to kitchen and  bedroom areas Discharge Home Access: Level entry (Level entry at side into family room area.) Does the patient have any problems obtaining your medications?: No  Social/Family/Support Systems Patient Roles: Spouse, Parent (Has a husband, daughter and 51 yo granddaughter.) Contact Information: Asma Boldon - spouse [phone] Anticipated Caregiver: Elwyn Reach - daughter Anticipated Caregiver's Contact Information: [name] - daughter [phone] Ability/Limitations of Caregiver: Husband works PT with variable shifts.  Dtr works from home and has breast cancer.  [place] daughter is 52 yo. Caregiver Availability: 24/7 Discharge Plan Discussed with Primary Caregiver: Yes Is Caregiver In Agreement with Plan?: Yes Does Caregiver/Family have Issues with Lodging/Transportation while Pt is in Rehab?: No  Goals/Additional Needs Patient/Family Goal for Rehab: PT mod I/Supervision, OT mod I to min assist, and ST Mod I goals Expected length of stay: 7-12 days Cultural Considerations: Christian Dietary Needs: Dys 2, thin liquids Equipment Needs: TBD Additional Information: Patient has developed a dry cough that is irritating her throat.  Is using lozenges. Pt/Family Agrees to Admission and willing to participate: Yes Program Orientation Provided & Reviewed with Pt/Caregiver Including Roles  & Responsibilities: Yes  Decrease burden of Care through IP rehab admission: N/A  Possible need for SNF placement upon discharge: Not anticipated  Patient Condition: This patient's condition remains as documented in the consult dated 06/07/14, in which the Rehabilitation Physician determined and documented that the patient's condition is appropriate for intensive rehabilitative care in an inpatient rehabilitation facility. Will admit to inpatient rehab today.  Preadmission Screen Completed By:  Retta Diones, 06/08/2014 1:24 PM ______________________________________________________________________   Discussed  status with Dr. Naaman Plummer on 06/08/14 at 1324 and received telephone approval for admission today.  Admission Coordinator:  Retta Diones, time1324/Date11/18/15

## 2014-06-08 NOTE — Plan of Care (Signed)
Problem: Discharge/Transitional Outcomes Goal: Barriers To Progression Addressed/Resolved Outcome: Completed/Met Date Met:  06/08/14

## 2014-06-08 NOTE — Progress Notes (Signed)
Rehab admissions - A bed has become available on inpatient rehab.  Will admit to acute inpatient rehab today.  Call me for questions.  #[phone]

## 2014-06-08 NOTE — Plan of Care (Signed)
Problem: Consults Goal: Skin Care Protocol Initiated - if Braden Score 18 or less If consults are not indicated, leave blank or document N/A  Outcome: Completed/Met Date Met:  06/08/14     

## 2014-06-08 NOTE — Progress Notes (Signed)
Pt was transferred via wheelchair by RN and nurse tech to CIR for continued rehab. Daughter and spouse were present for transfer. Telemetry disconnected and report given to Ed RN at [phone]. Pt was received by nurse in room 4W 23 at 1700.

## 2014-06-08 NOTE — Plan of Care (Signed)
Problem: Consults Goal: Ischemic Stroke Patient Education See Patient Education Module for education specifics.  Outcome: Completed/Met Date Met:  06/08/14

## 2014-06-08 NOTE — Plan of Care (Signed)
Problem: Progression Outcomes Goal: Pain controlled Outcome: Completed/Met Date Met:  06/08/14

## 2014-06-08 NOTE — Plan of Care (Signed)
Problem: Progression Outcomes Goal: Rehab Team goals identified Outcome: Completed/Met Date Met:  06/08/14 Goal: Tolerating diet/TF at goal rate Outcome: Completed/Met Date Met:  06/08/14 Goal: Bowel & Bladder Continence Outcome: Completed/Met Date Met:  06/08/14 Goal: Educational plan initiated Outcome: Completed/Met Date Met:  06/08/14  Problem: Discharge/Transitional Outcomes Goal: If vent dependent, trach in place Outcome: Not Applicable Date Met:  57/90/38 Goal: Tolerating diet/TF at goal rate-PEG if inadequate intake Outcome: Completed/Met Date Met:  06/08/14

## 2014-06-08 NOTE — Plan of Care (Signed)
Problem: Progression Outcomes Goal: Initial discharge plan initiated Outcome: Completed/Met Date Met:  06/08/14

## 2014-06-08 NOTE — Plan of Care (Signed)
Problem: Discharge/Transitional Outcomes Goal: Hemodynamically stable Outcome: Completed/Met Date Met:  06/08/14

## 2014-06-08 NOTE — Plan of Care (Signed)
Problem: Discharge/Transitional Outcomes Goal: Educational Plan Complete Outcome: Completed/Met Date Met:  06/08/14     

## 2014-06-08 NOTE — Plan of Care (Signed)
Problem: Consults Goal: Diabetes Guidelines if Diabetic/Glucose > 140 If diabetic or lab glucose is > 140 mg/dl - Initiate Diabetes/Hyperglycemia Guidelines & Document Interventions  Outcome: Not Applicable Date Met:  06/08/14     

## 2014-06-08 NOTE — Plan of Care (Signed)
Problem: Consults Goal: Nutrition Consult-if indicated Outcome: Not Applicable Date Met:  06/08/14     

## 2014-06-08 NOTE — Plan of Care (Signed)
Problem: Progression Outcomes Goal: Other Progression Outcomes Outcome: Completed/Met Date Met:  06/08/14     

## 2014-06-08 NOTE — Progress Notes (Signed)
Speech Language Pathology Treatment: Dysphagia;Cognitive-Linquistic  Patient Details Name: Mary Le MRN: [id number] DOB: 01-01-1946 Today's Date: 06/08/2014 Time: 0630-1601 SLP Time Calculation (min) (ACUTE ONLY): 20 min  Assessment / Plan / Recommendation Clinical Impression  Pt was seen during lunch meal, with Mod cues for emergent awareness of left-sided anterior loss and buccal pocketing with utilization of mirror. SLP provided Mod cues for use of liquid washes and lingual sweeps to clear residuals in oral cavity, and Min cues for slower rate to allow for oral clearance. Pt coughed x1 throughout intake, suspect due to premature spillage of pocketed material. She will continue to benefit from SLP services to target dysphagia and cognition upon transition to CIR.   HPI HPI: 68 year old female admitted 11/131/5 due to left side weakness an headache. PMH significant for HTN. Pt intubated 11/13-15/15. BSE ordered to evaluate swallow function and safety.   Pertinent Vitals Pain Assessment: 0-10 Pain Score: 8  Pain Location: headache Pain Intervention(s): Patient requesting pain meds-RN notified  SLP Plan  Continue with current plan of care    Recommendations Diet recommendations: Dysphagia 2 (fine chop);Thin liquid Liquids provided via: Cup;Straw Medication Administration: Whole meds with puree Supervision: Staff to assist with self feeding;Full supervision/cueing for compensatory strategies Compensations: Slow rate;Small sips/bites;Check for pocketing;Check for anterior loss;Follow solids with liquid Postural Changes and/or Swallow Maneuvers: Seated upright 90 degrees              Oral Care Recommendations: Oral care BID;Oral care before and after PO Follow up Recommendations: Inpatient Rehab Plan: Continue with current plan of care    GO      Germain Osgood, M.A. CCC-SLP [phone]  Germain Osgood 06/08/2014, 12:10 PM

## 2014-06-08 NOTE — Discharge Summary (Signed)
Stroke Discharge Summary  Patient ID: Mary Le   MRN: [id number]      DOB: [birth year]  Date of Admission: 06/03/2014 Date of Discharge: 06/08/2014  Attending Physician:  Antony Contras, MD, Stroke MD  Consulting Physician(s):    pulmonary/intensive care, rehabilitation medicine and Neurointerventional radiology    Patient's PCP:  No primary care provider on file.  Discharge Diagnoses:    Principal Problem:   Cerebral infarction due to occlusion of right middle cerebral artery of embolic etiology without definite identified source s/p endovascular revascularization of an occluded right middle cerebral artery and dominant inferior division using superselective intra-arterial intracranial Integrilin and 2 passes of solitaire  Fr stent retrieval device Active Problems:   Chest pain- Low risk Myoview July 2015   Benign essential HTN   Dyslipidemia- statin intol   Altered mental status   Acute respiratory failure with hypoxia   Morbid obesity- BMI 40 BMI  Body mass index is 35.56 kg/(m^2).  Past Medical History  Diagnosis Date  . Hypertension   . Hypercholesteremia   . Back pain     arthritis   Past Surgical History  Procedure Laterality Date  . Appendectomy    . Tonsillectomy    . Hemorroidectomy    . Tubal ligation    . Radiology with anesthesia N/A 06/03/2014    Procedure: RADIOLOGY WITH ANESTHESIA;  Surgeon: [name], MD;  Location: [facility];  Service: Radiology;  Laterality: N/A;  . Tee without cardioversion N/A 06/07/2014    Procedure: TRANSESOPHAGEAL ECHOCARDIOGRAM (TEE);  Surgeon: [name], MD;  Location: [facility] ENDOSCOPY;  Service: Cardiovascular;  Laterality: N/A;    Medications to be continued on Rehab . acetaminophen  1,000 mg Oral Once  . antiseptic oral rinse  7 mL Mouth Rinse BID  . aspirin  300 mg Rectal Daily  . heparin  5,000 Units Subcutaneous 3 times per day  . insulin aspart  0-9 Units Subcutaneous TID WC  . lisinopril  20 mg Oral Daily   . metoprolol succinate  25 mg Oral Daily  . multivitamin with minerals  1 tablet Oral Daily    LABORATORY STUDIES CBC    Component Value Date/Time   WBC 15.8* 06/04/2014 0510   RBC 4.54 06/04/2014 0510   HGB 12.4 06/04/2014 0510   HCT 38.0 06/04/2014 0510   PLT  06/04/2014 0510    PLATELET CLUMPS NOTED ON SMEAR, COUNT APPEARS ADEQUATE   MCV 83.7 06/04/2014 0510   MCH 27.3 06/04/2014 0510   MCHC 32.6 06/04/2014 0510   RDW 14.7 06/04/2014 0510   LYMPHSABS 1.9 06/04/2014 0510   MONOABS 0.9 06/04/2014 0510   EOSABS 0.0 06/04/2014 0510   BASOSABS 0.0 06/04/2014 0510   CMP    Component Value Date/Time   NA 142 06/06/2014 0224   K 4.0 06/06/2014 0224   CL 108 06/06/2014 0224   CO2 19 06/06/2014 0224   GLUCOSE 126* 06/06/2014 0224   BUN 15 06/06/2014 0224   CREATININE 0.71 06/06/2014 0224   CREATININE 0.79 01/03/2014 1614   CALCIUM 8.5 06/06/2014 0224   PROT 7.3 06/03/2014 1735   ALBUMIN 3.4* 06/03/2014 1735   AST 16 06/03/2014 1735   ALT 12 06/03/2014 1735   ALKPHOS 54 06/03/2014 1735   BILITOT 0.3 06/03/2014 1735   GFRNONAA 88* 06/06/2014 0224   GFRNONAA 78 01/03/2014 1614   GFRAA >90 06/06/2014 0224   GFRAA >89 01/03/2014 1614   COAGS Lab Results  Component Value Date  INR 1.04 06/03/2014   Lipid Panel    Component Value Date/Time   CHOL 219* 06/04/2014 0510   TRIG 163* 06/04/2014 0510   HDL 36* 06/04/2014 0510   CHOLHDL 6.1 06/04/2014 0510   VLDL 33 06/04/2014 0510   LDLCALC 150* 06/04/2014 0510   HgbA1C  Lab Results  Component Value Date   HGBA1C 6.2* 06/04/2014   Cardiac Panel (last 3 results) No results for input(s): CKTOTAL, CKMB, TROPONINI, RELINDX in the last 72 hours. Urinalysis    Component Value Date/Time   COLORURINE YELLOW 06/03/2014 1842   APPEARANCEUR CLOUDY* 06/03/2014 1842   LABSPEC 1.035* 06/03/2014 1842   PHURINE 5.5 06/03/2014 1842   GLUCOSEU NEGATIVE 06/03/2014 1842   HGBUR NEGATIVE 06/03/2014 1842   BILIRUBINUR SMALL*  06/03/2014 1842   BILIRUBINUR neg 01/03/2014 1521   KETONESUR NEGATIVE 06/03/2014 1842   PROTEINUR 30* 06/03/2014 1842   PROTEINUR neg 01/03/2014 1521   UROBILINOGEN 0.2 06/03/2014 1842   UROBILINOGEN 0.2 01/03/2014 1521   NITRITE NEGATIVE 06/03/2014 1842   NITRITE neg 01/03/2014 1521   LEUKOCYTESUR NEGATIVE 06/03/2014 1842   Urine Drug Screen     Component Value Date/Time   LABOPIA NONE DETECTED 06/03/2014 1842   COCAINSCRNUR NONE DETECTED 06/03/2014 1842   LABBENZ NONE DETECTED 06/03/2014 1842   AMPHETMU NONE DETECTED 06/03/2014 1842   THCU POSITIVE* 06/03/2014 1842   LABBARB NONE DETECTED 06/03/2014 1842    Alcohol Level    Component Value Date/Time   ETH <11 06/03/2014 1828     SIGNIFICANT DIAGNOSTIC STUDIES CT Head, MRI brain, cerebral catheter angiogram, 2DEcho,    HISTORY OF PRESENT ILLNES  Mary Le is an 68 y.o. female who awakened normal on 06/03/14. Went to have an MRI and returned normal. She did complain of not feeling well and laid down for a nap. When her family went to awaken her they noted a left facial droop and left sided weakness. EMS was called at that time and the patient was brought in as a code stroke. Initial NIHSS of 18. Per family patient has complained of a headache for the past 3 days.  Date last known well: Date: 06/03/2014 Time last known well: Time: 13:30 tPA Given: No: Outside time window  HOSPITAL COURSE Mary Le is a 68 y.o. female with history of HTN, HLD presenting with left hemiplegia, left neglect and HH, right gaze. NIH stroke scale on admission was 18. She did not receive IV t-PA due to out of window. CTA showed right M1 cut off and  Dr Estanislado Pandy neuroradiologist in IR performed ENDOVASCULAR COMPLETE REVASCULARIZATION OF OCCLUDED RIGHT MIDDLE CEREBRAL ARTERY DOMINANT INFERIOR DIVISION USING SUPERSELECTIVE INTRA-ARTERIAL INTRACRANIAL INTEGRELIN AND 2 PASSES WITH THE SOLITAIRE FR [place] was monitored  in intensive care unit and pulmonary critical care followed her initially as primary attending. She was subsequently extubated and found to falling commands though left upper extremity weakness and plegia persisted. Left lower extremity strength improved to 4/5. Patient's prep was tightly controlled. Postprocedure brain imaging did not reveal significant hemorrhagic transformation. Patient notes was transferred to the neurology floor where she made steady progress. She was seen by physical occupational and speech therapy. Transthoracic echo showed no significant cardiac source of embolism. TEE also showed no cardiac source of embolism. Patient had no recent venous Doppler which was negative for DVT. She had loop recorder placed to look for paroxysmal atrial fibrillation. She was felt to be a good candidate for inpatient rehabilitation and hence was transferred there  in a stable condition on 06/08/14. Her neurological deficits on day of discharge included mild left facial droop but no dysarthria. Left upper extremity was plegic 0/5. There was very mild weakness of the left lower extremity without any drift. She was started on aspirin and was advised to possibly consider participation in the Tony trial of Pradaxa versus aApirin for patients with cryptogenic stroke but this could be discussed later on outpatient follow-up visit  DISCHARGE EXAM Blood pressure 124/49, pulse 78, temperature 98.2 F (36.8 C), temperature source Oral, resp. rate 17, height 5\' 7"  (1.702 m), weight 103 kg (227 lb 1.2 oz), SpO2 98 %.  Her neurological deficits on day of discharge included mild left facial droop but no dysarthria. Left upper extremity was plegic 0/5. There was very mild weakness of the left lower extremity without any drift.  Discharge Diet  DIET DYS 2 thin liquids  DISCHARGE PLAN  Disposition:  Transfer to [facility] for ongoing PT, OT and ST  aspirin 325 mg orally every day for secondary  stroke prevention.  Recommend ongoing risk factor control by Primary Care Physician at time of discharge from inpatient rehabilitation.  Follow-up No primary care provider on file. in 2 weeks following discharge from rehab.  Follow-up with Dr. Antony Contras, Stroke Clinic in 1 month.  30 minutes were spent preparing discharge.  Antony Contras, MD

## 2014-06-08 NOTE — Plan of Care (Signed)
Problem: Discharge/Transitional Outcomes Goal: Other Discharge Outcomes/Goals Outcome: Completed/Met Date Met:  06/08/14     

## 2014-06-08 NOTE — Progress Notes (Deleted)
Reviewed all discharge documents with patient and daughter. Discussed the importance of stroke prevention strategies as outlined in the Stroke booklet and handouts. Patient feels that it is most important to lose weight and engage in an exercise program upon discharge. She was discharged to her home in  with transportation provided by daughter.            

## 2014-06-08 NOTE — Progress Notes (Signed)
Rehab admissions - Evaluated for possible admission.  I met with patient and her daughter.  They would like inpatient rehab admission.  I gave them rehab booklets and explained about inpatient rehab.  I will try to get patient a bed on inpatient rehab pending availability today later or tomorrow.  Call me for questions.  #[phone]

## 2014-06-08 NOTE — Progress Notes (Signed)
Physical Therapy Treatment Patient Details Name: Mary Le MRN: [id number] DOB: 1945/10/16 Today's Date: 06/08/2014    History of Present Illness  68 year old female admitted 11/131/5 due to left side weakness an headache. +Rt MCA near occlusion with Rt frontoparietal and insular CVA. 11/13 underwent clot retrieval and revascularization. Pt intubated 11/13-15/15. PMHx-HTN, back pain    PT Comments    Patient is progressing and able to walk out in hallway this session. She did require rest break with ambulation. Noted pocketing of lunch in L cheek. Will notify SLP. Continue to recommend comprehensive inpatient rehab (CIR) for post-acute therapy needs.   Follow Up Recommendations  CIR     Equipment Recommendations       Recommendations for Other Services       Precautions / Restrictions Precautions Precautions: Fall Precaution Comments: Lt shoulder subluxation risk    Mobility  Bed Mobility     Rolling: Min assist Sidelying to sit: Mod assist       General bed mobility comments: Use of rail to sit EOB. A for LLE positioning and protection of L arm.   Transfers Overall transfer level: Needs assistance Equipment used: None   Sit to Stand: Min assist         General transfer comment: Min A to ensure balance with standing and to come into midline stance. Cues to watch for LUE with sitting.   Ambulation/Gait Ambulation/Gait assistance: Min assist Ambulation Distance (Feet): 180 Feet Assistive device: 1 person hand held assist Gait Pattern/deviations: Step-to pattern;Decreased step length - left;Decreased stance time - right;Shuffle;Decreased dorsiflexion - left Gait velocity: decreased Gait velocity interpretation: Below normal speed for age/gender General Gait Details: Cues for midline posture and weight shifting. Patient with increase L foot drag with fatigue   Stairs            Wheelchair Mobility    Modified Rankin (Stroke Patients Only) Modified  Rankin (Stroke Patients Only) Pre-Morbid Rankin Score: No symptoms Modified Rankin: Moderately severe disability     Balance                                    Cognition Arousal/Alertness: Awake/alert Behavior During Therapy: Flat affect Overall Cognitive Status: Within Functional Limits for tasks assessed                      Exercises      General Comments        Pertinent Vitals/Pain Pain Assessment: 0-10 Pain Score: 6  Pain Location: headache Pain Descriptors / Indicators: Grimacing Pain Intervention(s): Monitored during session    Home Living                      Prior Function            PT Goals (current goals can now be found in the care plan section) Progress towards PT goals: Progressing toward goals    Frequency  Min 4X/week    PT Plan Current plan remains appropriate    Co-evaluation             End of Session Equipment Utilized During Treatment: Gait belt Activity Tolerance: Patient limited by fatigue Patient left: in chair;with call bell/phone within reach     Time: 1340-1415 PT Time Calculation (min) (ACUTE ONLY): 35 min  Charges:  $Gait Training: 8-22 mins $Therapeutic Activity: 8-22 mins  G Codes:      Jacqualyn Posey 06/08/2014, 2:52 PM 06/08/2014 Jacqualyn Posey PTA [phone] pager [phone] office

## 2014-06-08 NOTE — Plan of Care (Signed)
Problem: Discharge/Transitional Outcomes Goal: Family and patient agree upon discharge plan Outcome: Completed/Met Date Met:  06/08/14

## 2014-06-08 NOTE — Plan of Care (Signed)
Problem: Discharge/Transitional Outcomes Goal: Family/Caregiver willing and able to support plan Family/Caregiver willing and able to support plan for self-management after transition home  Outcome: Completed/Met Date Met:  06/08/14

## 2014-06-09 ENCOUNTER — Inpatient Hospital Stay (HOSPITAL_COMMUNITY): Payer: Medicare Other | Admitting: Occupational Therapy

## 2014-06-09 ENCOUNTER — Inpatient Hospital Stay (HOSPITAL_COMMUNITY): Payer: Medicare Other | Admitting: Physical Therapy

## 2014-06-09 ENCOUNTER — Inpatient Hospital Stay (HOSPITAL_COMMUNITY): Payer: Medicare Other | Admitting: Speech Pathology

## 2014-06-09 DIAGNOSIS — R414 Neurologic neglect syndrome: Secondary | ICD-10-CM

## 2014-06-09 DIAGNOSIS — G8100 Flaccid hemiplegia affecting unspecified side: Secondary | ICD-10-CM

## 2014-06-09 DIAGNOSIS — I69898 Other sequelae of other cerebrovascular disease: Secondary | ICD-10-CM

## 2014-06-09 DIAGNOSIS — R208 Other disturbances of skin sensation: Secondary | ICD-10-CM

## 2014-06-09 LAB — CBC WITH DIFFERENTIAL/PLATELET
Basophils Absolute: 0 10*3/uL (ref 0.0–0.1)
Basophils Relative: 0 % (ref 0–1)
EOS PCT: 2 % (ref 0–5)
Eosinophils Absolute: 0.2 10*3/uL (ref 0.0–0.7)
HEMATOCRIT: 39.4 % (ref 36.0–46.0)
Hemoglobin: 13.1 g/dL (ref 12.0–15.0)
LYMPHS PCT: 31 % (ref 12–46)
Lymphs Abs: 3 10*3/uL (ref 0.7–4.0)
MCH: 27.6 pg (ref 26.0–34.0)
MCHC: 33.2 g/dL (ref 30.0–36.0)
MCV: 83.1 fL (ref 78.0–100.0)
MONO ABS: 0.5 10*3/uL (ref 0.1–1.0)
Monocytes Relative: 5 % (ref 3–12)
Neutro Abs: 5.8 10*3/uL (ref 1.7–7.7)
Neutrophils Relative %: 62 % (ref 43–77)
Platelets: 246 10*3/uL (ref 150–400)
RBC: 4.74 MIL/uL (ref 3.87–5.11)
RDW: 14.4 % (ref 11.5–15.5)
WBC: 9.5 10*3/uL (ref 4.0–10.5)

## 2014-06-09 LAB — COMPREHENSIVE METABOLIC PANEL
ALT: 41 U/L — ABNORMAL HIGH (ref 0–35)
AST: 35 U/L (ref 0–37)
Albumin: 2.6 g/dL — ABNORMAL LOW (ref 3.5–5.2)
Alkaline Phosphatase: 44 U/L (ref 39–117)
Anion gap: 14 (ref 5–15)
BUN: 12 mg/dL (ref 6–23)
CALCIUM: 8.2 mg/dL — AB (ref 8.4–10.5)
CO2: 21 mEq/L (ref 19–32)
CREATININE: 0.68 mg/dL (ref 0.50–1.10)
Chloride: 107 mEq/L (ref 96–112)
GFR, EST NON AFRICAN AMERICAN: 89 mL/min — AB (ref >90)
Glucose, Bld: 94 mg/dL (ref 70–99)
Potassium: 3.8 mEq/L (ref 3.7–5.3)
Sodium: 142 mEq/L (ref 137–147)
Total Bilirubin: 0.5 mg/dL (ref 0.3–1.2)
Total Protein: 5.8 g/dL — ABNORMAL LOW (ref 6.0–8.3)

## 2014-06-09 MED ORDER — CALCIUM CITRATE-VITAMIN D 500-400 MG-UNIT PO CHEW: 1.0000 | CHEWABLE_TABLET | Freq: Two times a day (BID) | ORAL | Status: DC

## 2014-06-09 MED ORDER — CALCIUM CARBONATE-VITAMIN D 500-200 MG-UNIT PO TABS
1.0000 | ORAL_TABLET | Freq: Two times a day (BID) | ORAL | Status: DC
  Administered 2014-06-09 – 2014-06-23 (×28): 1 via ORAL
  Filled 2014-06-09 (×33): qty 1

## 2014-06-09 MED ORDER — METOPROLOL SUCCINATE 12.5 MG HALF TABLET
12.5000 mg | ORAL_TABLET | Freq: Every day | ORAL | Status: DC
  Administered 2014-06-11 – 2014-06-22 (×10): 12.5 mg via ORAL
  Filled 2014-06-09 (×15): qty 1

## 2014-06-09 NOTE — Progress Notes (Signed)
Expand All Collapse All        Physical Medicine and Rehabilitation Consult Reason for Consult:CVA Referring Physician: Dr. Leonie Man   HPI: Mary Le is a 68 y.o.right hand female with history of hypertension. Independent prior to admission living with family and assistance as needed.Presented 06/03/2014 left-sided weakness. Initial cranial CT scan negative.MRI of the brain shows acute infarct right MCA territory in the region of the insula, frontoparietal and high parietal region. MRA of the brain with no stenosis or aneurysm.echocardiogram with ejection fraction of 65% no wall motion abnormalities.Carotid Dopplers with no ICA stenosis.CTA and geode of the head and neck with distal right M1 occlusion.Lower extremity Dopplers no signs of DVT.Patient did not receive TPA. Patient underwent clot retrieval revascularization 06/03/2014 per interventional radiology. Placed on aspirin per neurology services for CVA prophylaxis and subcutaneous heparin for DVT prophylaxis.TEE completed 06/07/2014 with no thrombus normal LV function and underwent implant of loop recorder per Dr. [name]. Dysphagia 2 thin liquid diet. Physical therapy evaluation completed 06/07/2014 with recommendations of physical medicine rehabilitation consult.   Review of Systems  Gastrointestinal: Positive for constipation.  Musculoskeletal: Positive for back pain.  Neurological: Positive for weakness.  All other systems reviewed and are negative.  Past Medical History  Diagnosis Date  . Hypertension   . Hypercholesteremia   . Back pain     arthritis   Past Surgical History  Procedure Laterality Date  . Appendectomy    . Tonsillectomy    . Hemorroidectomy    . Tubal ligation    . Radiology with anesthesia N/A 06/03/2014    Procedure: RADIOLOGY WITH ANESTHESIA; Surgeon: [name], MD; Location: [facility]; Service: Radiology; Laterality: N/A;   Family History  Problem  Relation Age of Onset  . Coronary artery disease Mother 27    MI  . Kidney disease Father    Social History:  reports that she has quit smoking. She has never used smokeless tobacco. She reports that she drinks alcohol. She reports that she does not use illicit drugs. Allergies:  Allergies  Allergen Reactions  . Aspirin Nausea And Vomiting  . Statins Other (See Comments)    Myalgias and chest pain (has tried Lipitor and Pravachol)   Medications Prior to Admission  Medication Sig Dispense Refill  . acetaminophen (TYLENOL) 325 MG tablet Take 650 mg by mouth every 6 (six) hours as needed (pain).     . B Complex Vitamins (B COMPLEX-B12 PO) Take 1 tablet by mouth daily.     . fish oil-omega-3 fatty acids 1000 MG capsule Take 1 g by mouth daily.     Marland Kitchen lisinopril (PRINIVIL,ZESTRIL) 20 MG tablet Take 1 tablet (20 mg total) by mouth daily. 90 tablet 0  . loratadine (ALAVERT) 10 MG tablet Take 10 mg by mouth daily as needed for allergies.    . metoprolol succinate (TOPROL-XL) 25 MG 24 hr tablet Take 1 tablet (25 mg total) by mouth daily. 90 tablet 0  . Multiple Vitamin (MULTIVITAMIN WITH MINERALS) TABS tablet Take 1 tablet by mouth daily.    Vladimir Faster Glycol-Propyl Glycol (SYSTANE OP) Place 1 drop into both eyes daily as needed (dry eyeys).      Home: Home Living Family/patient expects to be discharged to:: Private residence Living Arrangements: Spouse/significant other, Children, Other relatives (daughter, Curator) Available Help at Discharge: Family, Available 24 hours/day (can provide minguard/supervision) Type of Home: House Home Access: Stairs to enter [employer] of Steps: 1 (back entrance) Entrance Stairs-Rails: None Home Layout: Two  level, Able to live on main level with bedroom/bathroom Alternate Level Stairs-Number of Steps: 3 (does not have to use these) Home Equipment: Shower seat - built  in Additional Comments: spouse had mild CVA earlier this year, daughter with breast cancer (has port and cannot lift)  Functional History: Prior Function Level of Independence: Independent Functional Status:  Mobility: Bed Mobility Overal bed mobility: Needs Assistance Bed Mobility: Rolling, Sidelying to Sit, Sit to Sidelying Rolling: Max assist (to Rt) Sidelying to sit: Mod assist Sit to sidelying: Mod assist General bed mobility comments: trying to rise from supine with inability to problem solve alternative method; physical assist/facilitation Transfers Overall transfer level: Needs assistance Equipment used: None Transfers: Sit to/from Stand, Stand Pivot Transfers Sit to Stand: Min assist Stand pivot transfers: Min assist General transfer comment: pivot Rt and Lt to [facility] Ambulation/Gait Ambulation/Gait assistance: Min assist Ambulation Distance (Feet): 1 Feet Assistive device: None General Gait Details: side step to Lt to [facility]    ADL:    Cognition: Cognition Overall Cognitive Status: Impaired/Different from baseline Arousal/Alertness: Awake/alert Orientation Level: Oriented X4 Attention: Focused, Sustained Focused Attention: Appears intact Sustained Attention: Appears intact Memory: Appears intact Awareness: Appears intact Problem Solving: Appears intact Executive Function: Sequencing, Armed forces logistics/support/administrative officer, Initiating Sequencing: Appears intact Organizing: Appears intact Initiating: Appears intact Safety/Judgment: Impaired Cognition Arousal/Alertness: Awake/alert Behavior During Therapy: Flat affect, Impulsive Overall Cognitive Status: Impaired/Different from baseline Area of Impairment: Orientation, Attention, Safety/judgement, Awareness, Problem solving Orientation Level: Place (initially thought she was at home; then able to state [facility]) Current Attention Level: Sustained Memory: Decreased short-term memory Safety/Judgement: Decreased awareness of safety, Decreased  awareness of deficits Awareness: Intellectual Problem Solving: Slow processing, Difficulty sequencing, Requires verbal cues, Requires tactile cues General Comments: impulsive once sitting EOB wanting to get to [facility]  Blood pressure 134/58, pulse 67, temperature 98.9 F (37.2 C), temperature source Oral, resp. rate 14, height 5\' 7"  (1.702 m), weight 103 kg (227 lb 1.2 oz), SpO2 97 %. Physical Exam  Constitutional: She appears well-developed.  obese  HENT:  Left facial droop  Eyes:  Pupils round and reactive to light.  Neck: Normal range of motion. Neck supple. No thyromegaly present.  Cardiovascular: Normal rate and regular rhythm.  Respiratory: Effort normal and breath sounds normal. No respiratory distress.  GI: Soft. Bowel sounds are normal. She exhibits no distension.  Musculoskeletal:  Edema LUE 1+  Neurological: She is alert.  Speech is dysarthric but intelligible. She does have a right gaze preference. She could provide her name, age as well as date of birth and follow simple commands. Left central 7 and tongue deviation. LUE.0/5 but does sense pain. LLE: 3+ to 4-/5 prox to distal. Fair insight and awareness.  Psychiatric:  flat     Lab Results Last 24 Hours    Results for orders placed or performed during the hospital encounter of 06/03/14 (from the past 24 hour(s))  Glucose, capillary Status: None   Collection Time: 06/06/14 5:17 PM  Result Value Ref Range   Glucose-Capillary 99 70 - 99 mg/dL  Glucose, capillary Status: None   Collection Time: 06/06/14 9:41 PM  Result Value Ref Range   Glucose-Capillary 87 70 - 99 mg/dL  Glucose, capillary Status: None   Collection Time: 06/07/14 6:53 AM  Result Value Ref Range   Glucose-Capillary 86 70 - 99 mg/dL  Glucose, capillary Status: None   Collection Time: 06/07/14 12:23 PM  Result Value Ref Range   Glucose-Capillary 71 70 - 99 mg/dL   Comment 1  Documented in  Chart       Imaging Results (Last 48 hours)    No results found.    Assessment/Plan: Diagnosis: Right MCA infarct with left hemiparesis and inattention 1. Does the need for close, 24 hr/day medical supervision in concert with the patient's rehab needs make it unreasonable for this patient to be served in a less intensive setting? Yes 2. Co-Morbidities requiring supervision/potential complications: htn, morbid obesity 3. Due to bladder management, bowel management, safety, skin/wound care, disease management, medication administration, pain management and patient education, does the patient require 24 hr/day rehab nursing? Yes 4. Does the patient require coordinated care of a physician, rehab nurse, PT (1-2 hrs/day, 5 days/week), OT (1-2 hrs/day, 5 days/week) and SLP (1-2 hrs/day, 5 days/week) to address physical and functional deficits in the context of the above medical diagnosis(es)? Yes Addressing deficits in the following areas: balance, endurance, locomotion, strength, transferring, bowel/bladder control, bathing, dressing, feeding, grooming, toileting, cognition, speech and psychosocial support 5. Can the patient actively participate in an intensive therapy program of at least 3 hrs of therapy per day at least 5 days per week? Yes 6. The potential for patient to make measurable gains while on inpatient rehab is excellent 7. Anticipated functional outcomes upon discharge from inpatient rehab are modified independent and supervision with PT, modified independent, supervision and min assist with OT, modified independent with SLP. 8. Estimated rehab length of stay to reach the above functional goals is: 7-12 days 9. Does the patient have adequate social supports and living environment to accommodate these discharge functional goals? Yes 10. Anticipated D/C setting: Home 11. Anticipated post D/C treatments: HH therapy and Outpatient therapy 12. Overall Rehab/Functional Prognosis:  excellent  RECOMMENDATIONS: This patient's condition is appropriate for continued rehabilitative care in the following setting: CIR Patient has agreed to participate in recommended program. Yes Note that insurance prior authorization may be required for reimbursement for recommended care.  Comment: Rehab Admissions Coordinator to follow up.  Thanks,  Meredith Staggers, MD, [facility]     06/07/2014       Revision History

## 2014-06-09 NOTE — Evaluation (Signed)
Occupational Therapy Assessment and Plan  Patient Details  Name: Mary Le MRN: [id number] Date of Birth: [birth year]  OT Diagnosis: abnormal posture, cognitive deficits, disturbance of vision, hemiplegia affecting non-dominant side and muscle weakness (generalized) Rehab Potential: Rehab Potential (ACUTE ONLY): Excellent ELOS: 12-14 days   Today's Date: 06/09/2014 OT Individual Time: 1100-1206 OT Individual Time Calculation (min): 66 min     Problem List:  Patient Active Problem List   Diagnosis Date Noted  . 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 Morbid obesity- BMI 40 06/06/2014  . Acute respiratory failure with hypoxia 06/05/2014  . Cerebral infarction due to occlusion of right middle cerebral artery 06/03/2014  . Cerebral infarct 06/03/2014  . Altered mental status 06/03/2014  . GERD (gastroesophageal reflux disease) 04/12/2014  . Benign essential HTN 02/07/2014  . Dyslipidemia- statin intol 02/07/2014  . Chest pain- Low risk Myoview July 2015 02/06/2014    Past Medical History:  Past Medical History  Diagnosis Date  . Hypertension   . Hypercholesteremia   . Back pain     arthritis   Past Surgical History:  Past Surgical History  Procedure Laterality Date  . Appendectomy    . Tonsillectomy    . Hemorroidectomy    . Tubal ligation    . Radiology with anesthesia N/A 06/03/2014    Procedure: RADIOLOGY WITH ANESTHESIA;  Surgeon: [name], MD;  Location: [place];  Service: Radiology;  Laterality: N/A;  . Tee without cardioversion N/A 06/07/2014    Procedure: TRANSESOPHAGEAL ECHOCARDIOGRAM (TEE);  Surgeon: [name], MD;  Location: [facility];  Service: Cardiovascular;  Laterality: N/A;    Assessment & Plan Clinical Impression: Patient is a 68 y.o. year old female with recent admission to the hospital on 06/03/2014 with left-sided weakness. Cranial CT scan negative and patient underwent CTA head/neck which revealed  distal right M1 occlusion with question of low density right temporal, parietal lobe and insula. she underwent endovacular revascularization of R-MCA with thrombectomy by Dr. Estanislado Pandy. MRI of the brain shows acute infarct right MCA territory in the region of the insula, frontoparietal and high parietal region.  Patient transferred to CIR on 06/08/2014 .    Patient currently requires mod with basic self-care skills secondary to muscle weakness, impaired timing and sequencing, unbalanced muscle activation and decreased coordination, decreased visual perceptual skills and field cut, decreased attention to left and left side neglect, decreased attention, decreased awareness, decreased problem solving, decreased safety awareness, decreased memory and delayed processing and decreased sitting balance, decreased standing balance, decreased postural control and hemiplegia.  Prior to hospitalization, patient could complete ADLs and IADLs with independent .  Patient will benefit from skilled intervention to decrease level of assist with basic self-care skills and increase independence with basic self-care skills prior to discharge home with care partner.  Anticipate patient will require 24 hour supervision and follow up outpatient.  OT - End of Session Activity Tolerance: Tolerates 10 - 20 min activity with multiple rests Endurance Deficit: Yes Endurance Deficit Description: Pt very fatigued at end of session and returned to bed to rest. OT Assessment Rehab Potential (ACUTE ONLY): Excellent OT Patient demonstrates impairments in the following area(s): Balance;Cognition;Endurance;Motor;Perception;Safety;Sensory;Vision OT Basic ADL's Functional Problem(s): Eating;Grooming;Bathing;Dressing;Toileting OT Advanced ADL's Functional Problem(s): Simple Meal Preparation OT Transfers Functional Problem(s): Toilet;Tub/Shower OT Additional Impairment(s): Fuctional Use of Upper Extremity OT Plan OT Intensity: Minimum of  1-2 x/day, 45 to 90 minutes OT Frequency: 5 out of 7 days OT Duration/Estimated Length of Stay:  12-14 days OT Treatment/Interventions: Balance/vestibular training;Cognitive remediation/compensation;Community reintegration;Discharge planning;Functional electrical stimulation;Functional mobility training;DME/adaptive equipment instruction;Disease mangement/prevention;Neuromuscular re-education;Self Care/advanced ADL retraining;Therapeutic Exercise;UE/LE Strength taining/ROM;Pain management;Patient/family education;Splinting/orthotics;UE/LE Coordination activities;Psychosocial support;Therapeutic Activities;Visual/perceptual remediation/compensation;Wheelchair propulsion/positioning OT Self Feeding Anticipated Outcome(s): modified independent OT Basic Self-Care Anticipated Outcome(s): supervision OT Toileting Anticipated Outcome(s): supervision OT Bathroom Transfers Anticipated Outcome(s): supervision OT Recommendation Patient destination: Home Follow Up Recommendations: Outpatient OT Equipment Recommended: 3 in 1 bedside comode  OT Evaluation Precautions/Restrictions  Precautions Precautions: Fall Precaution Comments: Lt shoulder subluxation risk Restrictions Weight Bearing Restrictions: No  Vital Signs Therapy Vitals Temp: 97.9 F (36.6 C) Temp Source: Oral Pulse Rate: 87 BP: 110/84 mmHg Patient Position (if appropriate): Standing Oxygen Therapy SpO2: 100 % O2 Device: Not Delivered Pain  No report of pain  Home Living/Prior Functioning Home Living Family/patient expects to be discharged to:: Private residence Living Arrangements: Spouse/significant other, Children Available Help at Discharge: Family, Available 24 hours/day Type of Home: House Home Access: Stairs to enter Technical brewer of Steps: 1 Entrance Stairs-Rails: None Home Layout: One level, Other (Comment) Alternate Level Stairs-Number of Steps:  (does not have to use these) Additional Comments: spouse had  2 mild CVAs in last 2 years, daughter with breast cancer (has port and cannot lift)  Lives With: Daughter, Significant other, Spouse Prior Function Level of Independence: Independent with basic ADLs  Able to Take Stairs?: Yes Driving: Yes Vocation: Retired Biomedical scientist: retired Geneticist, molecular Leisure: Hobbies-yes (Comment) Comments: likes to play board games (scrabble) and jigsaw puzzles ADL  See FIM scale for details  Vision/Perception  Vision- History Baseline Vision/History: No visual deficits Patient Visual Report: Other (comment) (Difficulty seeing to the left.) Vision- Assessment Vision Assessment?: Yes Eye Alignment: Impaired (comment) (Left eye sits more more superior) Ocular Range of Motion: Within Functional Limits Alignment/Gaze Preference: Head tilt;Gaze right Tracking/Visual Pursuits: Decreased smoothness of vertical tracking;Decreased smoothness of horizontal tracking Saccades: Impaired - to be further tested in functional context Convergence: Impaired - to be further tested in functional context Visual Fields: Left visual field deficit  Cognition Overall Cognitive Status: Impaired/Different from baseline Orientation Level: Oriented X4 Attention: Sustained Focused Attention: Appears intact Sustained Attention: Impaired Sustained Attention Impairment: Functional basic Memory: Impaired Memory Impairment: Decreased recall of new information;Decreased short term memory Awareness: Impaired Awareness Impairment: Emergent impairment;Anticipatory impairment Problem Solving: Impaired Problem Solving Impairment: Functional basic Sequencing: Impaired Sequencing Impairment: Functional basic Organizing: Impaired Organizing Impairment: Functional basic Behaviors: Impulsive Safety/Judgment: Impaired Comments: Pt with slight impulsivity noted with pt attempting to stand instead of scooting forward to the edge of the bed as initially instructed.  She also  demonstrated decreased sequencing and organization when attempting to perform bathing requiring overall mod instructional cueing to remember washing all body parts with thoroughness.  Sensation Sensation Light Touch Impaired Details: Absent LUE Stereognosis: Not tested Hot/Cold: Not tested Proprioception: Impaired Detail Proprioception Impaired Details: Absent LUE Additional Comments: Pt unable to detect any light touch throughout the UE and could not determine proprioceptive input to the shoulder or arm during session. Coordination Gross Motor Movements are Fluid and Coordinated: No Fine Motor Movements are Fluid and Coordinated: No Coordination and Movement Description: Pt is currently Brunnstrum stage I in the arm and hand.   Motor  Motor Motor: Hemiplegia;Abnormal postural alignment and control Motor - Skilled Clinical Observations: Pt demonstrates moderate LUE hemiparesis and mild LLE hemiparesis.  Mobility  Bed Mobility Bed Mobility: Supine to Sit;Sit to Supine Supine to Sit: HOB flat;2: Max assist Supine to Sit Details: Manual facilitation  for weight shifting;Verbal cues for precautions/safety Supine to Sit Details (indicate cue type and reason): Pt needed assistance with transition of UB into sitting from supine. Sit to Supine: 3: Mod assist;HOB flat Sit to Supine - Details: Manual facilitation for weight shifting;Manual facilitation for placement;Verbal cues for precautions/safety Sit to Supine - Details (indicate cue type and reason): Pt needed assistance with lifting the LLE into bed as well as cueing for technique. Transfers Transfers: Sit to Stand;Stand to Sit Sit to Stand: 4: Min assist;From bed;With upper extremity assist Sit to Stand Details: Manual facilitation for weight shifting;Verbal cues for precautions/safety Stand to Sit: 4: Min assist;With upper extremity assist;To bed Stand to Sit Details (indicate cue type and reason): Manual facilitation for weight  shifting;Verbal cues for precautions/safety  Trunk/Postural Assessment  Cervical Assessment Cervical Assessment: Exceptions to [facility] Cervical Strength Overall Cervical Strength Comments: Pt demonstrates slight cervical rotation to the right. Thoracic Assessment Thoracic Assessment: Within Functional Limits Lumbar Assessment Lumbar Assessment: Within Functional Limits Postural Control Postural Control: Deficits on evaluation Postural Limitations: Pt with increased left trunk elongation and increased weightshift to the left hip in sitting.  LOB posteriorly and to the left during dynamic tasks such as bathing and dressing.   Balance Balance Balance Assessed: Yes Static Sitting Balance Static Sitting - Balance Support: Right upper extremity supported;Feet supported Static Sitting - Level of Assistance: 5: Stand by assistance Dynamic Sitting Balance Dynamic Sitting - Balance Support: No upper extremity supported Dynamic Sitting - Level of Assistance: 4: Min assist Sitting balance - Comments: Pt with LOB posteriorly and to the left during bathing and dressing EOB. Static Standing Balance Static Standing - Balance Support: No upper extremity supported Static Standing - Level of Assistance: 4: Min assist Extremity/Trunk Assessment RUE Assessment RUE Assessment: Within Functional Limits (Not formally assessed but WFLs noted during selfcare tasks.) LUE Assessment LUE Assessment: Exceptions to [facility] LUE Strength LUE Overall Strength Comments: Pt is currently Brunnstrum stage I movement in the left arm and hand.  No attempted functional use during selfcare tasks.  Slight flexor tone beginning to form in the elbow flexors.  PROM left shoulder flexion 0-150 degrees with some end range pain,  Digit flexion extensiion and wrist flexion extension PROM WFLs. LUE Tone LUE Tone: Mild  FIM:  FIM - Eating Eating Activity: 5: Needs verbal cues/supervision;4: Helper checks for pocketed food FIM -  Grooming Grooming Steps: Wash, rinse, dry face Grooming: 3: Patient completes 2 of 4 or 3 of 5 steps FIM - Bathing Bathing Steps Patient Completed: Chest;Abdomen;Front perineal area;Right upper leg;Left upper leg;Right lower leg (including foot) Bathing: 3: Mod-Patient completes 5-7 [id number] parts or 50-74% FIM - Upper Body Dressing/Undressing Upper body dressing/undressing steps patient completed: Thread/unthread right sleeve of pullover shirt/dresss Upper body dressing/undressing: 2: Max-Patient completed 25-49% of tasks FIM - Lower Body Dressing/Undressing Lower body dressing/undressing steps patient completed: Thread/unthread right underwear leg;Thread/unthread right pants leg Lower body dressing/undressing: 2: Max-Patient completed 25-49% of tasks FIM - BEngineer, siteAssistive Devices: HOB elevated Bed/Chair Transfer: 3: Supine > Sit: Mod A (lifting assist/Pt. 50-74%/lift 2 legs;1: Two helpers;3: Bed > Chair or W/C: Mod A (lift or lower assist) FIM - Tub/Shower Transfers Tub/shower Transfers: 0-Activity did not occur or was simulated   Refer to Care Plan for Long Term Goals  Recommendations for other services: None  Discharge Criteria: Patient will be discharged from OT if patient refuses treatment 3 consecutive times without medical reason, if treatment goals not met, if there is  a change in medical status, if patient makes no progress towards goals or if patient is discharged from hospital.  The above assessment, treatment plan, treatment alternatives and goals were discussed and mutually agreed upon: by patient and by family   During session began education on selfcare re-training sitting EOB.  Pt with vasovagal episode during earlier PT session so per PA, OT session occurred at EOB.  Pt with no signs or symptoms of difficulty.  Daughter also present for session.  Educated both on PROM exercises and provided handout as reference to be performed daily.  Also  discussed positioning of the LUE and current sensory deficits as well.  Pt and daughter made aware of OT expected goals and LOS.  Pt placed back in bed at end of session secondary to fatigue level and being sleepy.    Shivangi Lutz OTR/L 06/09/2014, 3:43 PM

## 2014-06-09 NOTE — Care Management Note (Signed)
Inpatient [facility] Individual Statement of Services  Patient Name:  Mary Le  Date:  06/09/2014  Welcome to the [facility].  Our goal is to provide you with an individualized program based on your diagnosis and situation, designed to meet your specific needs.  With this comprehensive rehabilitation program, you will be expected to participate in at least 3 hours of rehabilitation therapies Monday-Friday, with modified therapy programming on the weekends.  Your rehabilitation program will include the following services:  Physical Therapy (PT), Occupational Therapy (OT), Speech Therapy (ST), 24 hour per day rehabilitation nursing, Therapeutic Recreaction (TR), Neuropsychology, Case Management (Social Worker), Rehabilitation Medicine, Nutrition Services and Pharmacy Services  Weekly team conferences will be held on Wednesday to discuss your progress.  Your Social Worker will talk with you frequently to get your input and to update you on team discussions.  Team conferences with you and your family in attendance may also be held.  Expected length of stay: 12-14 days Overall anticipated outcome: supervision with cueing  Depending on your progress and recovery, your program may change. Your Social Worker will coordinate services and will keep you informed of any changes. Your Social Worker's name and contact numbers are listed  below.  The following services may also be recommended but are not provided by the [facility] will be made to provide these services after discharge if needed.  Arrangements include referral to agencies that provide these services.  Your insurance has been verified to be:  [place] primary doctor is:  Shea Stakes  Pertinent information will be shared with your doctor and your  insurance company.  Social Worker:  Ovidio Kin, [place] or [phone]  Information discussed with and copy given to patient by: [name], 06/09/2014, 1:33 PM

## 2014-06-09 NOTE — Evaluation (Signed)
Physical Therapy Assessment and Plan  Patient Details  Name: Mary Le MRN: [id number] Date of Birth: [birth year]  PT Diagnosis: Abnormality of gait, Hemiplegia non-dominant, Impaired cognition, Impaired sensation and Muscle weakness Rehab Potential: Good ELOS: 12-14 days   Today's Date: 06/09/2014 PT Individual Time: [phone]  70 min    Problem List:  Patient Active Problem List   Diagnosis Date Noted  . 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 Morbid obesity- BMI 40 06/06/2014  . Acute respiratory failure with hypoxia 06/05/2014  . Cerebral infarction due to occlusion of right middle cerebral artery 06/03/2014  . Cerebral infarct 06/03/2014  . Altered mental status 06/03/2014  . GERD (gastroesophageal reflux disease) 04/12/2014  . Benign essential HTN 02/07/2014  . Dyslipidemia- statin intol 02/07/2014  . Chest pain- Low risk Myoview July 2015 02/06/2014    Past Medical History:  Past Medical History  Diagnosis Date  . Hypertension   . Hypercholesteremia   . Back pain     arthritis   Past Surgical History:  Past Surgical History  Procedure Laterality Date  . Appendectomy    . Tonsillectomy    . Hemorroidectomy    . Tubal ligation    . Radiology with anesthesia N/A 06/03/2014    Procedure: RADIOLOGY WITH ANESTHESIA;  Surgeon: [name], MD;  Location: [place];  Service: Radiology;  Laterality: N/A;  . Tee without cardioversion N/A 06/07/2014    Procedure: TRANSESOPHAGEAL ECHOCARDIOGRAM (TEE);  Surgeon: [name], MD;  Location: [facility];  Service: Cardiovascular;  Laterality: N/A;    Assessment & Plan Clinical Impression: Mary Le is a 68 y.o. RH female with h/o HTN, morbid obesity, dyslipidemia who was admitted on 06/03/2014 with left-sided weakness. Cranial CT scan negative and patient underwent CTA head/neck which revealed distal right M1 occlusion with question of low density right temporal, parietal lobe and  insula. she underwent endovacular revascularization of R-MCA with thrombectomy by Dr. Estanislado Pandy. MRI of the brain shows acute infarct right MCA territory in the region of the insula, frontoparietal and high parietal region. Patient intubated thorough 11/15 due to acute respiratory failure. MRA of the brain with no stenosis or aneurysm and return of flow in R-MCA territory with luxury perfusion. 2D echo showed EF of 65% and no wall motion abnormalities. Carotid Dopplers without significant ICA stenosis. BLE dopplers negative for DVT. TEE with normal LVF, no LAA and negative for PFO. Loop recorder was placed by Dr. [name] on 11/14. BSE done and patient started on dysphagia 2 with thin liquids. Patient with resultant L-HH, right gaze preference, left facial droop with mild dysarthria as well as right hemiparesis. Dr. Leonie Man recommended ASA for embolic infarct due to unknown etiology. Patient transferred to CIR on 06/08/2014 .   Patient currently requires min to mod with mobility secondary to muscle weakness, decreased cardiorespiratoy endurance, impaired timing and sequencing, abnormal tone, unbalanced muscle activation and decreased coordination, decreased midline orientation and left side neglect and decreased attention, decreased awareness, decreased problem solving, decreased safety awareness, decreased memory and delayed processing.  Prior to hospitalization, patient was independent  with mobility and lived with Daughter, Significant other (and granddaughter) in a House home.  Home access is 1 (back entrance)Stairs to enter.  Patient will benefit from skilled PT intervention to maximize safe functional mobility, minimize fall risk and decrease caregiver burden for planned discharge home with 24 hour supervision.  Anticipate patient will benefit from follow up [place] at discharge.  PT - End of  Session Activity Tolerance: Decreased this session;Tolerates 10 - 20 min activity with multiple rests Endurance  Deficit: Yes PT Assessment Rehab Potential (ACUTE/IP ONLY): Good Barriers to Discharge: Inaccessible home environment PT Patient demonstrates impairments in the following area(s): Balance;Behavior;Endurance;Motor;Pain;Perception;Safety;Sensory;Skin Integrity PT Transfers Functional Problem(s): Bed Mobility;Bed to Chair;Car;Furniture PT Locomotion Functional Problem(s): Ambulation;Wheelchair Mobility;Stairs PT Plan PT Intensity: Minimum of 1-2 x/day ,45 to 90 minutes PT Frequency: 5 out of 7 days PT Duration Estimated Length of Stay: 12-14 days PT Treatment/Interventions: Ambulation/gait training;Balance/vestibular training;Cognitive remediation/compensation;Community reintegration;Discharge planning;Disease management/prevention;DME/adaptive equipment instruction;Functional mobility training;Neuromuscular re-education;Pain management;Patient/family education;Psychosocial support;Stair training;Therapeutic Activities;Therapeutic Exercise;UE/LE Strength taining/ROM;UE/LE Coordination activities;Visual/perceptual remediation/compensation;Wheelchair propulsion/positioning PT Transfers Anticipated Outcome(s): supervision PT Locomotion Anticipated Outcome(s): supervision PT Recommendation Follow Up Recommendations: Home health PT Patient destination: Home Equipment Recommended: To be determined  Skilled Therapeutic Intervention Skilled therapeutic intervention initiated after completion of evaluation. Discussed with patient and daughter falls risk, safety within room, and focus of therapy during stay. Discussed possible LOS, goals, and f/u therapy. Both verbalized understanding. Pt noted to be slightly impulsive resulting in need for mod assist but with improved command following more closely to min assist for transfers and ambulation. Pt non-symptomatic throughout evaluation and treatment. Upon returning to pt room, pt requesting to return to bed due to fatigue. Pt became unresponsive with syncopal  episode and RN alerted. Pt able to respond by the time RN arrived and returned to bed with +2 total assist and positioned in trendelenberg. After BP reading below, pt reporting need to urinate. Pt rolled with mod A and left on bedpan with RN and daughter present.    PT Evaluation Precautions/Restrictions Precautions Precautions: Fall Precaution Comments: Lt shoulder subluxation risk Restrictions Weight Bearing Restrictions: No General Chart Reviewed: Yes Family/Caregiver Present: No  Vital Signs 136/37 supine following syncopal episode Pain  Denies pain Home Living/Prior Functioning Home Living Available Help at Discharge: Family;Available 24 hours/day (can provide minguard/supervision) Type of Home: House Home Access: Stairs to enter [employer] of Steps: 1 (back entrance) Entrance Stairs-Rails: None Home Layout: One level;Other (Comment) (to get from living room to kitchen one step and from kitchen to dining room one step inside home) Alternate Level Stairs-Number of Steps:  (does not have to use these) Additional Comments: spouse had 2 mild CVAs in last 2 years, daughter with breast cancer (has port and cannot lift)  Lives With: Daughter;Significant other (and granddaughter) Prior Function Level of Independence: Independent with gait;Independent with transfers;Independent with basic ADLs;Independent with homemaking with ambulation  Able to Take Stairs?: Yes Driving: Yes Vocation: Retired Biomedical scientist: retired Geneticist, molecular Leisure: Hobbies-yes (Comment) Comments: likes to play board games (scrabble) and jigsaw puzzles Vision/Perception   Defer to OT evaluation  Cognition Overall Cognitive Status: Impaired/Different from baseline Arousal/Alertness: Lethargic Orientation Level: Oriented to person;Oriented to place;Oriented to situation Attention: Sustained Focused Attention: Appears intact Sustained Attention: Impaired Sustained Attention  Impairment: Verbal basic;Functional basic Memory: Impaired Memory Impairment: Decreased recall of new information;Decreased short term memory Decreased Short Term Memory: Verbal basic;Functional basic Awareness: Impaired Awareness Impairment: Emergent impairment Problem Solving: Impaired Problem Solving Impairment: Verbal basic;Functional basic Behaviors: Impulsive Safety/Judgment: Impaired Sensation Sensation Light Touch Impaired Details: Absent LUE;Impaired LLE Stereognosis: Not tested Hot/Cold: Not tested Proprioception: Impaired Detail Proprioception Impaired Details: Absent LUE;Impaired LLE Additional Comments: Pt unable to detect any light touch throughout the UE and could not determine proprioceptive input to the shoulder or arm during session. Coordination Gross Motor Movements are Fluid and Coordinated: No Fine Motor Movements are Fluid and Coordinated:  No Coordination and Movement Description: Pt is currently Brunnstrum stage I in the arm and hand.   Motor  Motor Motor: Hemiplegia;Abnormal postural alignment and control Motor - Skilled Clinical Observations: Pt demonstrates moderate LUE hemiparesis and mild LLE hemiparesis.   Mobility Bed Mobility Bed Mobility: Supine to Sit;Sit to Supine Supine to Sit: HOB flat;3: Mod assist Supine to Sit Details: Manual facilitation for weight shifting;Verbal cues for precautions/safety;Verbal cues for technique;Verbal cues for sequencing Sit to Supine: HOB flat;1: +2 Total assist Sit to Supine - Details (indicate cue type and reason): +2 assist due to syncopal episode Transfers Transfers: Yes Sit to Stand: 4: Min assist;With upper extremity assist Sit to Stand Details: Manual facilitation for weight shifting;Verbal cues for precautions/safety Stand to Sit: 4: Min assist Stand to Sit Details (indicate cue type and reason): Manual facilitation for weight shifting;Verbal cues for precautions/safety;Verbal cues for technique Locomotion   Ambulation Ambulation: Yes Ambulation/Gait Assistance: 4: Min assist;3: Mod assist Ambulation Distance (Feet): 90 Feet Assistive device: 1 person hand held assist Ambulation/Gait Assistance Details: Verbal cues for precautions/safety;Manual facilitation for weight shifting Gait Gait: Yes Gait Pattern: Impaired Gait Pattern: Shuffle;Step-through pattern;Decreased weight shift to right;Decreased step length - left;Decreased trunk rotation Gait velocity: decreased Stairs / Additional Locomotion Stairs: Yes Stairs Assistance: 4: Min assist;3: Mod assist Stairs Assistance Details: Verbal cues for technique;Verbal cues for precautions/safety;Verbal cues for sequencing Stair Management Technique: One rail Right;Step to pattern;Forwards Number of Stairs: 5 Height of Stairs: 6 Wheelchair Mobility Wheelchair Mobility: Yes Wheelchair Assistance: 3: Mod Lexicographer: Right upper extremity Wheelchair Parts Management: Needs assistance Distance: 100  Trunk/Postural Assessment  Cervical Assessment Cervical Assessment: Exceptions to [facility] Cervical Strength Overall Cervical Strength Comments: Pt demonstrates slight cervical rotation to the right. Thoracic Assessment Thoracic Assessment: Within Functional Limits Lumbar Assessment Lumbar Assessment: Within Functional Limits Postural Control Postural Control: Deficits on evaluation Postural Limitations: Pt with increased left trunk elongation and increased weightshift to the left in sitting and standing. LOB posteriorly and to the left during dynamic tasks  Balance Balance Balance Assessed: Yes Static Sitting Balance Static Sitting - Balance Support: Feet supported Static Sitting - Level of Assistance: 5: Stand by assistance Dynamic Sitting Balance Dynamic Sitting - Balance Support: No upper extremity supported;During functional activity;Feet supported Dynamic Sitting - Level of Assistance: 4: Min assist Sitting balance -  Comments: LOB to left and posteriorly Static Standing Balance Static Standing - Balance Support: During functional activity;Right upper extremity supported Static Standing - Level of Assistance: 4: Min assist Extremity Assessment  RUE Assessment RUE Assessment: Within Functional Limits LUE Assessment LUE Assessment: Exceptions to [facility] LUE Strength LUE Overall Strength Comments: Pt is currently Brunnstrum stage I movement in the left arm and hand.  No attempted functional use during selfcare tasks.  Slight flexor tone beginning to form in the elbow flexors.  PROM left shoulder flexion 0-150 degrees with some end range pain,  Digit flexion extension and wrist flexion extension PROM WFLs. LUE Tone LUE Tone: Mild RLE Assessment RLE Assessment: Within Functional Limits LLE Assessment LLE Assessment: Exceptions to [facility] LLE Strength LLE Overall Strength: Deficits LLE Overall Strength Comments: Grossly 3+ to 4-/5 throughout  FIM:  FIM - Bed/Chair Transfer Bed/Chair Transfer Assistive Devices: HOB elevated Bed/Chair Transfer: 3: Supine > Sit: Mod A (lifting assist/Pt. 50-74%/lift 2 legs;1: Two helpers;3: Bed > Chair or W/C: Mod A (lift or lower assist) FIM - Locomotion: Wheelchair Distance: 100 Locomotion: Wheelchair: 2: Travels 50 - 149 ft with moderate assistance (Pt: 50 -  74%) FIM - Locomotion: Ambulation Locomotion: Ambulation Assistive Devices: Other (comment) (HHA) Ambulation/Gait Assistance: 4: Min assist;3: Mod assist Locomotion: Ambulation: 2: Travels 50 - 149 ft with moderate assistance (Pt: 50 - 74%) FIM - Locomotion: Stairs Locomotion: Scientist, physiological: Hand rail - 1 Locomotion: Stairs: 2: Up and Down 4 - 11 stairs with moderate assistance (Pt: 50 - 74%)   Refer to Care Plan for Long Term Goals  Recommendations for other services: Neuropsych  Discharge Criteria: Patient will be discharged from PT if patient refuses treatment 3 consecutive times without medical reason,  if treatment goals not met, if there is a change in medical status, if patient makes no progress towards goals or if patient is discharged from hospital.  The above assessment, treatment plan, treatment alternatives and goals were discussed and mutually agreed upon: by patient and by family  [name] 06/09/2014, 8:20 PM

## 2014-06-09 NOTE — Progress Notes (Addendum)
Patient with syncopal episode past PT session--did stairs as well as walked today. Denies CP or SOB. Apical HR 56. Likely due to orthostatic hypotension. Will check EKG. Monitor orthostatic BP and set parameters on medications. Lytes OK this am.   EKG with sinus bradycardia--will decrease metoprolol to 12.5 mg and monitor HR with activity.

## 2014-06-09 NOTE — Progress Notes (Signed)
Social Work Assessment and Plan Social Work Assessment and Plan  Patient Details  Name: Mary Le MRN: [id number] Date of Birth: [birth year]  Today's Date: 06/09/2014  Problem List:  Patient Active Problem List   Diagnosis Date Noted  . 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 Morbid obesity- BMI 40 06/06/2014  . Acute respiratory failure with hypoxia 06/05/2014  . Cerebral infarction due to occlusion of right middle cerebral artery 06/03/2014  . Cerebral infarct 06/03/2014  . Altered mental status 06/03/2014  . GERD (gastroesophageal reflux disease) 04/12/2014  . Benign essential HTN 02/07/2014  . Dyslipidemia- statin intol 02/07/2014  . Chest pain- Low risk Myoview July 2015 02/06/2014   Past Medical History:  Past Medical History  Diagnosis Date  . Hypertension   . Hypercholesteremia   . Back pain     arthritis   Past Surgical History:  Past Surgical History  Procedure Laterality Date  . Appendectomy    . Tonsillectomy    . Hemorroidectomy    . Tubal ligation    . Radiology with anesthesia N/A 06/03/2014    Procedure: RADIOLOGY WITH ANESTHESIA;  Surgeon: [name], MD;  Location: [place];  Service: Radiology;  Laterality: N/A;  . Tee without cardioversion N/A 06/07/2014    Procedure: TRANSESOPHAGEAL ECHOCARDIOGRAM (TEE);  Surgeon: [name], MD;  Location: [facility] ENDOSCOPY;  Service: Cardiovascular;  Laterality: N/A;   Social History:  reports that she has quit smoking. She has never used smokeless tobacco. She reports that she drinks alcohol. She reports that she does not use illicit drugs.  Family / Support Systems Marital Status: Married Patient Roles: Spouse, Parent Spouse/Significant Other: Herbie [place]  [phone] Children: [name]-daughter  [phone] Other Supports: Sadie-granddaughter   Anticipated Caregiver: daughter and husband Ability/Limitations of Caregiver: Husband works part-time and daughter is  finishing being treated for breast cancer-reports she is cancer free Caregiver Availability: 24/7 Family Dynamics: Close knit family all live together and are very involved in each other's lives.  Pt feels very fortuante to have them and their support.  Daughter is here observing in therapies and providing emotional support.  Social History Preferred language: English Religion: Methodist Cultural Background: No issues Education: [employer] Read: Yes Write: Yes Employment Status: Retired Freight forwarder Issues: No issues Guardian/Conservator: None-according to MD pt is capable of making her own decisions while here.  Daughter and husband are usually here also.   Abuse/Neglect Physical Abuse: Denies Verbal Abuse: Denies Sexual Abuse: Denies Exploitation of patient/patient's resources: Denies Self-Neglect: Denies  Emotional Status Pt's affect, behavior adn adjustment status: Pt is motivated to improve, she has always been independent and taken care of others, this is a new role for her.  Her daughter reports: " If anyone can do it she can."  She is very positive and has a storng faith to pull her through. She is glad to be here on rehab to begin the healing. Recent Psychosocial Issues: Other medical issues-were managed.  Still puzzles her reason she had this stroke. Pyschiatric History: No history-deferred depression screen due to pt still processing and feeling she is doing ok at this time.  She relies upon her faith and has signs posted up in her room to remind her of the bigger picture.  She may benefit from Neuor-psych eval some time during her stay Substance Abuse History: No issues  Patient / Family Perceptions, Expectations & Goals Pt/Family understanding of illness & functional limitations: Pt and daughter have a good understanding of  her stroke and deficits.  Both are still questioning how this happened and are awaiting MD to get back with them.  They talk with MD  daily while rounding and feel their concerns are being addressed. Premorbid pt/family roles/activities: Wife, Mother, Grandmother, retiree, home owner, church member, etc Anticipated changes in roles/activities/participation: resume Pt/family expectations/goals: Pt states;" God will lead the way for me and get me to where I need to go."    Daughter states: " She will do well here, she is highly motivated and a hard worker."  [employer]: None Premorbid Home Care/DME Agencies: None Transportation available at discharge: family Resource referrals recommended: Support group (specify), Neuropsychology  Discharge Planning Living Arrangements: Spouse/significant other, Children Support Systems: Spouse/significant other, Children, Water engineer, Social worker community Type of Residence: Private residence Insurance underwriter Resources: [employer], Multimedia programmer (specify) ([employer]) Museum/gallery curator Resources: Family Support, Radio broadcast assistant Screen Referred: No Living Expenses: Lives with family Money Management: Spouse, Patient Does the patient have any problems obtaining your medications?: No Home Management: Family all help one another Patient/Family Preliminary Plans: Return home with husband and daughter assisting her with her care.  Her grandaughter can also if needed in the evenings.  All are very close and willing to do for one another.  Daughter plans to be here and participate in Mom's therapies when apporpriate. Social Work Anticipated Follow Up Needs: HH/OP, Support Group  Clinical Impression Pleasant female who is highly motivated and willing to do what it takes to recover form her stroke.  Her daughter is present and very supportive and willing to assist.  She reports Mom helped me now it is my time To help her.  Pt's husband has had TIA's and completely recovered from them, she is hopeful she will recover also.  Will provide support and assist with  discharge planning.  [name] 06/09/2014, 1:50 PM

## 2014-06-09 NOTE — Progress Notes (Signed)
Patient information reviewed and entered into eRehab system by Avrianna Smart, RN, CRRN, PPS Coordinator.  Information including medical coding and functional independence measure will be reviewed and updated through discharge.     Per nursing patient was given "Data Collection Information Summary for Patients in Inpatient Rehabilitation Facilities with attached "Privacy Act Statement-Health Care Records" upon admission.  

## 2014-06-09 NOTE — Progress Notes (Signed)
PMR Admission Coordinator Pre-Admission Assessment  Patient: Mary Le is an 68 y.o., female MRN: [id number] DOB: 1946-01-20 Height: 5\' 7"  (170.2 cm) Weight: 103 kg (227 lb 1.2 oz)  Insurance Information HMO: No PPO: PCP: IPA: 80/20: OTHER:  PRIMARY: Medicare A/B Policy#: [id number] Subscriber: [place] Name: Phone#: Fax#:  Pre-Cert#: Employer: Not employed Benefits: Phone #: Name: Automated Eff. Date: 09/19/12 Deduct: $1260 Out of Pocket Max: none Life Max: unlimited CIR: 100% SNF: 100 days Outpatient: 80% Co-Pay: 20% Home Health: 100% Co-Pay: none DME: 80% Co-Pay: 20% Providers: patient's choice  SECONDARY: [employer] Policy#: [id number] Subscriber: Merlyn Albert CM Name: Phone#: Fax#:  Pre-Cert#: Employer: Not employed Benefits: Phone #: [phone] Name:  Eff. Date: Deduct: Out of Pocket Max: Life Max:  CIR: SNF:  Outpatient: Co-Pay:  Home Health: Co-Pay:  DME: Co-Pay:    Emergency Contact Information Contact Information    Name Relation Home Work Mobile   Cedrone,Robert Spouse [phone]     Krol,[name] Daughter   [phone]   Del, Wiseman   [phone]     Current Medical History  Patient Admitting Diagnosis: R MCA infarct  History of Present Illness: A 68 y.o.right hand female with history of hypertension. Independent prior to admission living with family and assistance as needed.Presented 06/03/2014 left-sided weakness. Initial cranial CT scan negative.MRI of the brain shows acute infarct right MCA territory in the region of the insula, frontoparietal and high parietal region. MRA of the  brain with no stenosis or aneurysm.echocardiogram with ejection fraction of 65% no wall motion abnormalities.Carotid Dopplers with no ICA stenosis.CTA and geode of the head and neck with distal right M1 occlusion.Lower extremity Dopplers no signs of DVT.Patient did not receive TPA. Patient underwent clot retrieval revascularization 06/03/2014 per interventional radiology. Placed on aspirin per neurology services for CVA prophylaxis and subcutaneous heparin for DVT prophylaxis.TEE completed 06/07/2014 with no thrombus normal LV function and underwent implant of loop recorder per Dr. [name]. Dysphagia 2 thin liquid diet. Physical therapy evaluation completed 06/07/2014 with recommendations of physical medicine rehabilitation consult.  Total: 11=NIH  Past Medical History  Past Medical History  Diagnosis Date  . Hypertension   . Hypercholesteremia   . Back pain     arthritis    Family History  family history includes Coronary artery disease (age of onset: 87) in her mother; Kidney disease in her father.  Prior Rehab/Hospitalizations: Had rehab for her back 6 months ago as an outpatient 1 X a week.  Current Medications  Current facility-administered medications: acetaminophen (TYLENOL) tablet 1,000 mg, 1,000 mg, Oral, Once, Antony Contras, MD; antiseptic oral rinse (CPC / CETYLPYRIDINIUM CHLORIDE 0.05%) solution 7 mL, 7 mL, Mouth Rinse, BID, Juanito Doom, MD, 7 mL at 06/08/14 1253; aspirin suppository 300 mg, 300 mg, Rectal, Daily, Antony Contras, MD, 300 mg at 06/08/14 1253 heparin injection 5,000 Units, 5,000 Units, Subcutaneous, 3 times per day, Luz Brazen, MD, 5,000 Units at 06/08/14 0636; insulin aspart (novoLOG) injection 0-9 Units, 0-9 Units, Subcutaneous, TID WC, Luz Brazen, MD, Stopped at 06/08/14 [phone]; lisinopril (PRINIVIL,ZESTRIL) tablet 20 mg, 20 mg, Oral, Daily, Antony Contras, MD, 20 mg at 06/08/14 4503 metoprolol succinate (TOPROL-XL) 24 hr tablet 25 mg,  25 mg, Oral, Daily, Antony Contras, MD, 25 mg at 06/08/14 8882; multivitamin with minerals tablet 1 tablet, 1 tablet, Oral, Daily, Antony Contras, MD, 1 tablet at 06/08/14 0953; senna-docusate (Senokot-S) tablet 1 tablet, 1 tablet, Oral, QHS PRN, Alexis Goodell, MD  Patients Current Diet: DIET DYS 2  Precautions /  Restrictions Precautions Precautions: Fall Precaution Comments: Lt shoulder subluxation risk Restrictions Weight Bearing Restrictions: No   Prior Activity Level Community (5-7x/wk): Went out daily up until 1 week ago. Was taking daughter to radiation therapy appointments. Was driving.   Home Assistive Devices / Equipment Home Equipment: Shower seat - built in  Prior Functional Level Prior Function Level of Independence: Independent  Current Functional Level Cognition  Arousal/Alertness: Awake/alert Overall Cognitive Status: Impaired/Different from baseline Current Attention Level: Sustained Orientation Level: Oriented X4 Safety/Judgement: Decreased awareness of safety, Decreased awareness of deficits General Comments: impulsive once sitting EOB wanting to get to [facility] Attention: Focused, Sustained Focused Attention: Appears intact Sustained Attention: Appears intact Memory: Appears intact Awareness: Appears intact Problem Solving: Appears intact Executive Function: Sequencing, Armed forces logistics/support/administrative officer, Initiating Sequencing: Appears intact Organizing: Appears intact Initiating: Appears intact Safety/Judgment: Impaired   Extremity Assessment (includes Sensation/Coordination)  Upper Extremity Assessment: Defer to OT evaluation;LUE deficits/detail (min shoulder shrug) Lower Extremity Assessment: LLE deficits/detail LLE Deficits / Details: AAROM WFL (?more limited by neglect than weakness); knee extension 4/5, dorsiflexion 4/5 Cervical / Trunk Assessment: Other exceptions    ADLs  Anticipate ADL needs    Mobility  Overal bed mobility: Needs Assistance Bed  Mobility: Rolling, Sidelying to Sit, Sit to Sidelying Rolling: Max assist (to Rt) Sidelying to sit: Mod assist Sit to sidelying: Mod assist General bed mobility comments: trying to rise from supine with inability to problem solve alternative method; physical assist/facilitation    Transfers  Overall transfer level: Needs assistance Equipment used: None Transfers: Sit to/from Stand, Stand Pivot Transfers Sit to Stand: Min assist Stand pivot transfers: Min assist General transfer comment: pivot Rt and Lt to [facility]    Ambulation / Gait / Stairs / Wheelchair Mobility  Ambulation/Gait Ambulation/Gait assistance: Museum/gallery curator (Feet): 1 Feet Assistive device: None General Gait Details: side step to Lt to [facility]    Posture / Balance Dynamic Sitting Balance Sitting balance - Comments: leans to Lt    Special needs/care consideration BiPAP/CPAP No CPM No Continuous Drip IV No Dialysis No  Life Vest No Oxygen No Special Bed No Trach Size No Wound Vac (area) No  Skin No  Bowel mgmt: Last BM per daughter was 06/05/14 Bladder mgmt: Voiding up on [facility] with some stress incontinence Diabetic mgmt No    Previous Home Environment Living Arrangements: Spouse/significant other, Children, Other relatives (daughter, Curator) Available Help at Discharge: Family, Available 24 hours/day (can provide minguard/supervision) Type of Home: House Home Layout: Two level, Able to live on main level with bedroom/bathroom Alternate Level Stairs-Number of Steps: 3 (does not have to use these) Home Access: Stairs to enter Entrance Stairs-Rails: None Entrance Stairs-Number of Steps: 1 (back entrance) Home Care Services: No Additional Comments: spouse had mild CVA earlier this year, daughter with breast cancer (has port and cannot lift)  Discharge Living Setting Plans for Discharge Living Setting: Patient's home, House, Lives with  (comment) (Lives with husband, daughter and granddaughter.) Type of Home at Discharge: House Discharge Home Layout: Two level Alternate Level Stairs-Number of Steps: 1 step to den and 2 steps to kitchen and bedroom areas Discharge Home Access: Level entry (Level entry at side into family room area.) Does the patient have any problems obtaining your medications?: No  Social/Family/Support Systems Patient Roles: Spouse, Parent (Has a husband, daughter and 78 yo granddaughter.) Contact Information: Torey Regan - spouse [phone] Anticipated Caregiver: Elwyn Reach - daughter Anticipated Caregiver's Contact Information: [name] - daughter [phone] Ability/Limitations of Caregiver: Husband works  PT with variable shifts. Dtr works from home and has breast cancer. Dillard daughter is 58 yo. Caregiver Availability: 24/7 Discharge Plan Discussed with Primary Caregiver: Yes Is Caregiver In Agreement with Plan?: Yes Does Caregiver/Family have Issues with Lodging/Transportation while Pt is in Rehab?: No  Goals/Additional Needs Patient/Family Goal for Rehab: PT mod I/Supervision, OT mod I to min assist, and ST Mod I goals Expected length of stay: 7-12 days Cultural Considerations: Christian Dietary Needs: Dys 2, thin liquids Equipment Needs: TBD Additional Information: Patient has developed a dry cough that is irritating her throat. Is using lozenges. Pt/Family Agrees to Admission and willing to participate: Yes Program Orientation Provided & Reviewed with Pt/Caregiver Including Roles & Responsibilities: Yes  Decrease burden of Care through IP rehab admission: N/A  Possible need for SNF placement upon discharge: Not anticipated  Patient Condition: This patient's condition remains as documented in the consult dated 06/07/14, in which the Rehabilitation Physician determined and documented that the patient's condition is appropriate for intensive rehabilitative care in an inpatient  rehabilitation facility. Will admit to inpatient rehab today.  Preadmission Screen Completed By: Retta Diones, 06/08/2014 1:24 PM ______________________________________________________________________  Discussed status with Dr. Naaman Plummer on 06/08/14 at 1324 and received telephone approval for admission today.  Admission Coordinator: Retta Diones, time1324/Date11/18/15          Cosigned by: Meredith Staggers, MD at 06/08/2014 2:16 PM  Revision History

## 2014-06-09 NOTE — Progress Notes (Signed)
68 y.o. RH female with h/o HTN, morbid obesity, dyslipidemia who was admitted on  06/03/2014 with left-sided weakness. Cranial CT scan negative and patient underwent CTA head/neck which revealed distal right M1 occlusion with question of low density right temporal, parietal lobe and insula.  she underwent endovacular revascularization of R-MCA with thrombectomy by Dr. Estanislado Pandy.  MRI of the brain shows acute infarct right MCA territory in the region of the insula, frontoparietal and high parietal region. Patient intubated thorough 11/15 due to acute respiratory failure.  MRA of the brain with no stenosis or aneurysm and return of flow in R-MCA territory with luxury perfusion.  2D echo showed EF of 65% and no wall motion abnormalities. Carotid Dopplers without significant ICA stenosis. BLE dopplers negative for DVT. TEE with normal LVF, no LAA and negative for PFO. Loop recorder was placed by Dr. [name] on 11/14. BSE done and patient started on dysphagia 2 with thin liquids. Patient with resultant L-HH, right gaze preference, left facial droop with mild dysarthria as well as right hemiparesis. Dr. Leonie Man recommended ASA for embolic infarct due to unknown etiology.  Subjective/Complaints:   Objective: Vital Signs: Blood pressure 146/55, pulse 61, temperature 97.5 F (36.4 C), temperature source Oral, resp. rate 18, weight 101.334 kg (223 lb 6.4 oz), SpO2 96 %. No results found. Results for orders placed or performed during the hospital encounter of 06/08/14 (from the past 72 hour(s))  CBC WITH DIFFERENTIAL     Status: None   Collection Time: 06/09/14  4:52 AM  Result Value Ref Range   WBC 9.5 4.0 - 10.5 K/uL   RBC 4.74 3.87 - 5.11 MIL/uL   Hemoglobin 13.1 12.0 - 15.0 g/dL   HCT 39.4 36.0 - 46.0 %   MCV 83.1 78.0 - 100.0 fL   MCH 27.6 26.0 - 34.0 pg   MCHC 33.2 30.0 - 36.0 g/dL   RDW 14.4 11.5 - 15.5 %   Platelets 246 150 - 400 K/uL   Neutrophils Relative % 62 43 - 77 %   Neutro Abs 5.8 1.7 - 7.7  K/uL   Lymphocytes Relative 31 12 - 46 %   Lymphs Abs 3.0 0.7 - 4.0 K/uL   Monocytes Relative 5 3 - 12 %   Monocytes Absolute 0.5 0.1 - 1.0 K/uL   Eosinophils Relative 2 0 - 5 %   Eosinophils Absolute 0.2 0.0 - 0.7 K/uL   Basophils Relative 0 0 - 1 %   Basophils Absolute 0.0 0.0 - 0.1 K/uL  Comprehensive metabolic panel     Status: Abnormal   Collection Time: 06/09/14  4:52 AM  Result Value Ref Range   Sodium 142 137 - 147 mEq/L   Potassium 3.8 3.7 - 5.3 mEq/L   Chloride 107 96 - 112 mEq/L   CO2 21 19 - 32 mEq/L   Glucose, Bld 94 70 - 99 mg/dL   BUN 12 6 - 23 mg/dL   Creatinine, Ser 0.68 0.50 - 1.10 mg/dL   Calcium 8.2 (L) 8.4 - 10.5 mg/dL   Total Protein 5.8 (L) 6.0 - 8.3 g/dL   Albumin 2.6 (L) 3.5 - 5.2 g/dL   AST 35 0 - 37 U/L   ALT 41 (H) 0 - 35 U/L   Alkaline Phosphatase 44 39 - 117 U/L   Total Bilirubin 0.5 0.3 - 1.2 mg/dL   GFR calc non Af Amer 89 (L) >90 mL/min   GFR calc Af Amer >90 >90 mL/min    Comment: (NOTE) The  eGFR has been calculated using the CKD EPI equation. This calculation has not been validated in all clinical situations. eGFR's persistently <90 mL/min signify possible Chronic Kidney Disease.    Anion gap 14 5 - 15     HEENT: normal Cardio: RRR and no murmur Resp: CTA B/L and unlabored GI: BS positive and NT Extremity:  Pulses positive and No Edema Skin:   Intact Neuro: Lethargic, Cranial Nerve Abnormalities Left central VII, Abnormal Sensory absent LT LUE, Abnormal Motor 0/5 LUE, 3- LLE, 5/5 on Right, Tone:  Decreased LUE and Inattention Musc/Skel:  Other no pain with LUE ROM Gen NAD   Assessment/Plan: 1. Functional deficits secondary to Right MCA infarct uncertain etiology which require 3+ hours per day of interdisciplinary therapy in a comprehensive inpatient rehab setting. Physiatrist is providing close team supervision and 24 hour management of active medical problems listed below. Physiatrist and rehab team continue to assess barriers to  discharge/monitor patient progress toward functional and medical goals. FIM:                   Comprehension Comprehension Mode: Auditory Comprehension: 5-Follows basic conversation/direction: With no assist  Expression Expression Mode: Verbal Expression: 5-Expresses basic needs/ideas: With extra time/assistive device  Social Interaction Social Interaction: 5-Interacts appropriately 90% of the time - Needs monitoring or encouragement for participation or interaction.         Medical Problem List and Plan: 1. Functional deficits secondary to R-MCA infarct --patient and husband report intolerance of ASA due to severe dyspepsia. Discussed with Neuro NP and will change to plavix.   2.  DVT Prophylaxis/Anticoagulation: Pharmaceutical: Lovenox 3. Headaches/ Pain Management: Has ben getting IV fentanyl once daily and reports poor results with tylenol.  Will try oxycodone prn. May need to add Topamax for more consistent relief.   4. Mood: LCSW to follow for evaluation and support.   5. Neuropsych: This patient is capable of making decisions on her own behalf. 6. Skin/Wound Care: Routine pressure relief measures.   7. Fluids/Electrolytes/Nutrition: Monitor I/O especially as on dysphagia diet. Check lytes in am. 8. HTN: Monitor BP every 8 hours. Continue metoprolol and lisinopril daily. Avoid hypotension.   9. Dyslipidemia: intolerant of statins   LOS (Days) 1 A FACE TO FACE EVALUATION WAS PERFORMED  KIRSTEINS,ANDREW E 06/09/2014, 8:02 AM

## 2014-06-09 NOTE — Evaluation (Signed)
Speech Language Pathology Assessment and Plan  Patient Details  Name: Mary Le MRN: [id number] Date of Birth: 1946-05-13  SLP Diagnosis: Dysarthria;Cognitive Impairments;Dysphagia  Rehab Potential: Good ELOS: 10-14 days    Today's Date: 06/09/2014 SLP Individual Time: 3748-2707 SLP Individual Time Calculation (min): 60 min   Problem List:  Patient Active Problem List   Diagnosis Date Noted  . 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 Morbid obesity- BMI 40 06/06/2014  . Acute respiratory failure with hypoxia 06/05/2014  . Cerebral infarction due to occlusion of right middle cerebral artery 06/03/2014  . Cerebral infarct 06/03/2014  . Altered mental status 06/03/2014  . GERD (gastroesophageal reflux disease) 04/12/2014  . Benign essential HTN 02/07/2014  . Dyslipidemia- statin intol 02/07/2014  . Chest pain- Low risk Myoview July 2015 02/06/2014   Past Medical History:  Past Medical History  Diagnosis Date  . Hypertension   . Hypercholesteremia   . Back pain     arthritis   Past Surgical History:  Past Surgical History  Procedure Laterality Date  . Appendectomy    . Tonsillectomy    . Hemorroidectomy    . Tubal ligation    . Radiology with anesthesia N/A 06/03/2014    Procedure: RADIOLOGY WITH ANESTHESIA;  Surgeon: [name], MD;  Location: [facility];  Service: Radiology;  Laterality: N/A;  . Tee without cardioversion N/A 06/07/2014    Procedure: TRANSESOPHAGEAL ECHOCARDIOGRAM (TEE);  Surgeon: [name], MD;  Location: [facility] ENDOSCOPY;  Service: Cardiovascular;  Laterality: N/A;    Assessment / Plan / Recommendation Clinical Impression Mary Le is a 68 y.o. RH female with h/o HTN, morbid obesity, dyslipidemia who was admitted on  06/03/2014 with left-sided weakness. Cranial CT scan negative and patient underwent CTA head/neck which revealed distal right M1 occlusion with question of low density right temporal, parietal lobe  and insula.  she underwent endovacular revascularization of R-MCA with thrombectomy by Dr. Estanislado Pandy.  MRI of the brain shows acute infarct right MCA territory in the region of the insula, frontoparietal and high parietal region. Patient intubated thorough 11/15 due to acute respiratory failure.  MRA of the brain with no stenosis or aneurysm and return of flow in R-MCA territory with luxury perfusion.  2D echo showed EF of 65% and no wall motion abnormalities. Carotid Dopplers without significant ICA stenosis. BLE dopplers negative for DVT. TEE with normal LVF, no LAA and negative for PFO. Loop recorder was placed by Dr. [name] on 11/14. BSE done and patient started on dysphagia 2 with thin liquids. Patient with resultant L-HH, right gaze preference, left facial droop with mild dysarthria as well as right hemiparesis. Dr. Leonie Man recommended ASA for embolic infarct due to unknown etiology. Therapy ongoing and CIR recommended by MD and rehab team.  Orders received; bedside swallow and cognitive-linguistic evaluations completed. Patient demonstrates left-sided oral weakness resulting in dysarthria with reduced vocal intensity resulting in overall speech intelligibility. This oral weakness also results in oral dysphagia characterized by left anterior spillage, buccal pocketing and oral residue. Cognition also appears to impact swallow function characterized by decreased sustained attention resulting in oral holding and poor awareness to bolus. Patient appears to be tolerating Dys. 2 textures with thin liquids with Max A multimodal cues for utilization of swallowing compensatory strategies of slow rate, small bites and sips, alternating solids and liquids and management of left sided buccal pocketing. Additional cognitive deficits impacting functioning are decreased sustained attention, emergent awareness, safety awareness, problem solving and memory.  Decreased attention to left field of environment as well as monotone  speech are other impairments noted. As a result, patient would benefit from skilled SLP intervention to maximize her swallowing, speech and cognitive-linguistic function in order to maximize functional independence prior to discharge home with 24/7 family assist.   Skilled Therapeutic Interventions          Patient administered bedside swallow and cognitive-linguistic evaluations. Please see above for details. Patient educated in regards to current cognitive and swallowing function and goals of skilled SLP intervention. Patient verbalized understanding.    SLP Assessment  Patient will need skilled Speech Lanaguage Pathology Services during CIR admission    Recommendations  Diet Recommendations: Dysphagia 2 (Fine chop);Thin liquid Liquid Administration via: Straw;Cup Medication Administration: Whole meds with puree Supervision: Staff to assist with self feeding;Full supervision/cueing for compensatory strategies Compensations: Slow rate;Small sips/bites;Check for pocketing;Check for anterior loss;Follow solids with liquid Postural Changes and/or Swallow Maneuvers: Seated upright 90 degrees Oral Care Recommendations: Oral care BID Patient destination: Home Follow up Recommendations: Outpatient SLP Equipment Recommended: None recommended by SLP    SLP Frequency 5 out of 7 days   SLP Treatment/Interventions Cognitive remediation/compensation;Cueing hierarchy;Functional tasks;Environmental controls;Speech/Language facilitation;Patient/family education;Dysphagia/aspiration precaution training;Internal/external aids;Medication managment;Oral motor exercises;Therapeutic Activities    Pain Pain Assessment Pain Assessment: Yes pain Pain Score:  (unable to rate) Pain Type: Chronic pain Pain Location: Head Pain Orientation: Left Pain Descriptors / Indicators: Headache Pain Onset: On-going Pain Intervention(s): RN made aware Multiple Pain Sites: No  Prior Functioning Cognitive/Linguistic  Baseline: Within functional limits Type of Home: House  Lives With: Daughter;Significant other;Spouse Available Help at Discharge: Family;Available 24 hours/day Vocation: Retired  Industrial/product designer Term Goals: Week 1: SLP Short Term Goal 1 (Week 1): Pt will consume trials of Dys. 3 textures with Min A multimodal cues for buccal clearance over 3 consecutive sessions. SLP Short Term Goal 2 (Week 1): Pt will sustain attention to basic functional tasks for 15 minutes with Mod A multimodal cues. SLP Short Term Goal 3 (Week 1): Pt will attend to left field of environment during functional tasks with Mod A multimodal cues.  SLP Short Term Goal 4 (Week 1): Pt will demonstrate emergent awareness during functional tasks with Mod A multimodal cues.  SLP Short Term Goal 5 (Week 1): Pt will utilize external memory aids to recall new, daily information with Mod A multimodal cues. SLP Short Term Goal 6 (Week 1): Pt will increase overall intelligibility at the sentence level with Min A multimodal cues for utilization of increased vocal intensity and over articulation.   See FIM for current functional status Refer to Care Plan for Long Term Goals  Recommendations for other services: None  Discharge Criteria: Patient will be discharged from SLP if patient refuses treatment 3 consecutive times without medical reason, if treatment goals not met, if there is a change in medical status, if patient makes no progress towards goals or if patient is discharged from hospital.  The above assessment, treatment plan, treatment alternatives and goals were discussed and mutually agreed upon: by patient  Bo Rogue 06/09/2014, 2:24 PM

## 2014-06-09 NOTE — Progress Notes (Signed)
Recreational Therapy Assessment and Plan  Patient Details  Name: Mary Le MRN: [id number] Date of Birth: 1945/08/08 Today's Date: 06/09/2014  Rehab Potential:  Good  ELOS: 14 days  Assessment Clinical Impression: Problem List:  Patient Active Problem List   Diagnosis Date Noted  . 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 Morbid obesity- BMI 40 06/06/2014  . Acute respiratory failure with hypoxia 06/05/2014  . Cerebral infarction due to occlusion of right middle cerebral artery 06/03/2014  . Cerebral infarct 06/03/2014  . Altered mental status 06/03/2014  . GERD (gastroesophageal reflux disease) 04/12/2014  . Benign essential HTN 02/07/2014  . Dyslipidemia- statin intol 02/07/2014  . Chest pain- Low risk Myoview July 2015 02/06/2014    Past Medical History:  Past Medical History  Diagnosis Date  . Hypertension   . Hypercholesteremia   . Back pain     arthritis   Past Surgical History:  Past Surgical History  Procedure Laterality Date  . Appendectomy    . Tonsillectomy    . Hemorroidectomy    . Tubal ligation    . Radiology with anesthesia N/A 06/03/2014    Procedure: RADIOLOGY WITH ANESTHESIA; Surgeon: [name], MD; Location: [facility]; Service: Radiology; Laterality: N/A;  . Tee without cardioversion N/A 06/07/2014    Procedure: TRANSESOPHAGEAL ECHOCARDIOGRAM (TEE); Surgeon: [name], MD; Location: [facility] ENDOSCOPY; Service: Cardiovascular; Laterality: N/A;    Assessment & Plan Clinical Impression: Patient is a 68 y.o. year old female with recent admission to the hospital on 06/03/2014 with left-sided weakness. Cranial CT scan negative and patient underwent CTA head/neck which revealed distal right M1 occlusion with question of low density right temporal, parietal lobe and insula. she underwent endovacular revascularization of R-MCA  with thrombectomy by Dr. Estanislado Pandy. MRI of the brain shows acute infarct right MCA territory in the region of the insula, frontoparietal and high parietal region. Patient transferred to CIR on 06/08/2014 .   Pt presents with decreased activity tolerance, decreased functional mobility, decreased balance, decreased coordination, decreased vision, left neglect, decreased attention, decreased awareness, decreased problem solving, decreased awareness, decreased memory, delayed processing Limiting pt's independence with leisure/community pursuits.  Leisure History/Participation Premorbid leisure interest/current participation: Games - Network engineer - Doctor, hospital - Building control surveyor - Travel (Comment) Other Leisure Interests: Television Leisure Participation Style: With Family/Friends Awareness of Community Resources: Good-identify 3 post discharge leisure resources Psychosocial / Spiritual Patient agreeable to Pet Therapy: Yes Social interaction - Mood/Behavior: Cooperative Academic librarian Appropriate for Education?: Yes Recreational Therapy Orientation Orientation -Reviewed with patient: Available activity resources Strengths/Weaknesses Patient Strengths/Abilities: Willingness to participate;Active premorbidly Patient weaknesses: Physical limitations TR Patient demonstrates impairments in the following area(s): Endurance;Motor;Safety;Skin Integrity  Plan Min 1 time per week >20 minutes  Recommendations for other services: None  Discharge Criteria: Patient will be discharged from TR if patient refuses treatment 3 consecutive times without medical reason.  If treatment goals not met, if there is a change in medical status, if patient makes no progress towards goals or if patient is discharged from hospital.  The above assessment, treatment plan, treatment alternatives and goals were discussed and mutually agreed upon: by  patient  Perth Amboy 06/09/2014, 4:36 PM

## 2014-06-10 ENCOUNTER — Inpatient Hospital Stay (HOSPITAL_COMMUNITY): Payer: Medicare Other | Admitting: Speech Pathology

## 2014-06-10 ENCOUNTER — Inpatient Hospital Stay (HOSPITAL_COMMUNITY): Payer: Medicare Other | Admitting: Occupational Therapy

## 2014-06-10 ENCOUNTER — Inpatient Hospital Stay (HOSPITAL_COMMUNITY): Payer: Medicare Other | Admitting: Physical Therapy

## 2014-06-10 DIAGNOSIS — R414 Neurologic neglect syndrome: Secondary | ICD-10-CM | POA: Diagnosis present

## 2014-06-10 DIAGNOSIS — G8194 Hemiplegia, unspecified affecting left nondominant side: Secondary | ICD-10-CM

## 2014-06-10 DIAGNOSIS — G8190 Hemiplegia, unspecified affecting unspecified side: Secondary | ICD-10-CM

## 2014-06-10 NOTE — Progress Notes (Signed)
Physical Therapy Session Note  Patient Details  Name: Mary Le MRN: [id number] Date of Birth: 05-Mar-1946  Today's Date: 06/10/2014 PT Individual Time: 1415-1515 PT Individual Time Calculation (min): 60 min   Short Term Goals: Week 1:  PT Short Term Goal 1 (Week 1): = LTGs of overall supervision  Skilled Therapeutic Interventions/Progress Updates:   Pt received semi reclined in bed, daughter and granddaughter present for first half of session. Pt transferred supine > sit with HOB elevated and mod A to advance to edge of bed. Pt performed stand pivot transfer bed > w/c and w/c <> mat table with min A. W/c propulsion using BLE only for LLE NMR x 150 ft with min-mod A and max cues for sequencing with LLE. Pt's orthostatic vitals obtained, see below. Pt ambulated throughout rehab unit in controlled and home environments x 150 ft with min HHA as well as no AD with min guard, pt requires verbal cues to decrease LLE drag for L heel strike. In ADL apartment, pt performed bed mobility on regular bed to simulate home environment with min A for LLE with sit > supine and mod A for supine > sit. Pt requesting to use bathroom, pt ambulated to bathroom in ADL apartment and performed toilet transfer to standard commode with min A. Pt requires assist for clothing management in standing and supervision for hygiene. Pt ambulated to sink and able to wash LUE without cues, min A for standing balance. Pt ambulated back to therapy gym from ADL apartment with min guard. Pt kicked yoga block with LLE while ambulating to facilitate increased L foot clearance with effective carryover noted. Berg Balance Scale administered with score of 24/56. Pt returned to room and left sitting in w/c with visitors present, pt/visitors educated to call nursing when visitors departing to assist pt with return to bed.   Therapy Documentation Precautions:  Precautions Precautions: Fall Precaution Comments: Lt shoulder subluxation risk, left  neglect with right peripheral gaze Restrictions Weight Bearing Restrictions: No Vital Signs: Therapy Vitals Patient Position (if appropriate): Orthostatic Vitals Sitting 119/63, HR 72 Standing 117/45, HR 74 Oxygen Therapy SpO2: 100 % O2 Device: Not Delivered Pain: Pain Assessment Pain Assessment: No/denies pain Locomotion : Ambulation Ambulation/Gait Assistance: 4: Min guard;4: Min Financial controller Distance: 150  Balance: Balance Balance Assessed: Yes Standardized Balance Assessment Standardized Balance Assessment: Berg Balance Test Berg Balance Test Sit to Stand: Able to stand  independently using hands Standing Unsupported: Able to stand 30 seconds unsupported Sitting with Back Unsupported but Feet Supported on Floor or Stool: Able to sit safely and securely 2 minutes Stand to Sit: Sits independently, has uncontrolled descent Transfers: Able to transfer safely, definite need of hands Standing Unsupported with Eyes Closed: Able to stand 10 seconds with supervision Standing Ubsupported with Feet Together: Able to place feet together independently but unable to hold for 30 seconds From Standing, Reach Forward with Outstretched Arm: Reaches forward but needs supervision From Standing Position, Pick up Object from Floor: Able to pick up shoe, needs supervision From Standing Position, Turn to Look Behind Over each Shoulder: Needs supervision when turning Turn 360 Degrees: Needs close supervision or verbal cueing Standing Unsupported, Alternately Place Feet on Step/Stool: Needs assistance to keep from falling or unable to try Standing Unsupported, One Foot in Front: Loses balance while stepping or standing Standing on One Leg: Unable to try or needs assist to prevent fall Total Score: 24/56   See FIM for current functional status  Therapy/Group: Individual Therapy  [name] 06/10/2014, 3:49 PM

## 2014-06-10 NOTE — Progress Notes (Signed)
68 y.o. RH female with h/o HTN, morbid obesity, dyslipidemia who was admitted on  06/03/2014 with left-sided weakness. Cranial CT scan negative and patient underwent CTA head/neck which revealed distal right M1 occlusion with question of low density right temporal, parietal lobe and insula.  she underwent endovacular revascularization of R-MCA with thrombectomy by Dr. Estanislado Pandy.  MRI of the brain shows acute infarct right MCA territory in the region of the insula, frontoparietal and high parietal region. Patient intubated thorough 11/15 due to acute respiratory failure.  MRA of the brain with no stenosis or aneurysm and return of flow in R-MCA territory with luxury perfusion.  2D echo showed EF of 65% and no wall motion abnormalities. Carotid Dopplers without significant ICA stenosis. BLE dopplers negative for DVT. TEE with normal LVF, no LAA and negative for PFO. Loop recorder was placed by Dr. [name] on 11/14. BSE done and patient started on dysphagia 2 with thin liquids. Patient with resultant L-HH, right gaze preference, left facial droop with mild dysarthria as well as right hemiparesis. Dr. Leonie Man recommended ASA for embolic infarct due to unknown etiology.  Subjective/Complaints: No pain C/os  Dry cough last noc relieved wit robitussin Review of Systems - Negative except as above , denies SOB Objective: Vital Signs: Blood pressure 128/54, pulse 59, temperature 98.4 F (36.9 C), temperature source Oral, resp. rate 18, height '5\' 7"'  (1.702 m), weight 101.334 kg (223 lb 6.4 oz), SpO2 100 %. No results found. Results for orders placed or performed during the hospital encounter of 06/08/14 (from the past 72 hour(s))  CBC WITH DIFFERENTIAL     Status: None   Collection Time: 06/09/14  4:52 AM  Result Value Ref Range   WBC 9.5 4.0 - 10.5 K/uL   RBC 4.74 3.87 - 5.11 MIL/uL   Hemoglobin 13.1 12.0 - 15.0 g/dL   HCT 39.4 36.0 - 46.0 %   MCV 83.1 78.0 - 100.0 fL   MCH 27.6 26.0 - 34.0 pg   MCHC 33.2  30.0 - 36.0 g/dL   RDW 14.4 11.5 - 15.5 %   Platelets 246 150 - 400 K/uL   Neutrophils Relative % 62 43 - 77 %   Neutro Abs 5.8 1.7 - 7.7 K/uL   Lymphocytes Relative 31 12 - 46 %   Lymphs Abs 3.0 0.7 - 4.0 K/uL   Monocytes Relative 5 3 - 12 %   Monocytes Absolute 0.5 0.1 - 1.0 K/uL   Eosinophils Relative 2 0 - 5 %   Eosinophils Absolute 0.2 0.0 - 0.7 K/uL   Basophils Relative 0 0 - 1 %   Basophils Absolute 0.0 0.0 - 0.1 K/uL  Comprehensive metabolic panel     Status: Abnormal   Collection Time: 06/09/14  4:52 AM  Result Value Ref Range   Sodium 142 137 - 147 mEq/L   Potassium 3.8 3.7 - 5.3 mEq/L   Chloride 107 96 - 112 mEq/L   CO2 21 19 - 32 mEq/L   Glucose, Bld 94 70 - 99 mg/dL   BUN 12 6 - 23 mg/dL   Creatinine, Ser 0.68 0.50 - 1.10 mg/dL   Calcium 8.2 (L) 8.4 - 10.5 mg/dL   Total Protein 5.8 (L) 6.0 - 8.3 g/dL   Albumin 2.6 (L) 3.5 - 5.2 g/dL   AST 35 0 - 37 U/L   ALT 41 (H) 0 - 35 U/L   Alkaline Phosphatase 44 39 - 117 U/L   Total Bilirubin 0.5 0.3 - 1.2 mg/dL  GFR calc non Af Amer 89 (L) >90 mL/min   GFR calc Af Amer >90 >90 mL/min    Comment: (NOTE) The eGFR has been calculated using the CKD EPI equation. This calculation has not been validated in all clinical situations. eGFR's persistently <90 mL/min signify possible Chronic Kidney Disease.    Anion gap 14 5 - 15     HEENT: normal Cardio: RRR and no murmur Resp: CTA B/L and unlabored GI: BS positive and NT Extremity:  Pulses positive and No Edema Skin:   Intact Neuro: Lethargic, Cranial Nerve Abnormalities Left central VII, Abnormal Sensory absent LT LUE, Abnormal Motor 0/5 LUE, 3- LLE, 5/5 on Right, Tone:  Decreased LUE and Inattention Musc/Skel:  Other no pain with LUE ROM Gen NAD   Assessment/Plan: 1. Functional deficits secondary to Right MCA infarct uncertain etiology which require 3+ hours per day of interdisciplinary therapy in a comprehensive inpatient rehab setting. Physiatrist is providing  close team supervision and 24 hour management of active medical problems listed below. Physiatrist and rehab team continue to assess barriers to discharge/monitor patient progress toward functional and medical goals. FIM: FIM - Bathing Bathing Steps Patient Completed: Chest, Abdomen, Front perineal area, Right upper leg, Left upper leg, Right lower leg (including foot) Bathing: 3: Mod-Patient completes 5-7 32f10 parts or 50-74%  FIM - Upper Body Dressing/Undressing Upper body dressing/undressing steps patient completed: Thread/unthread right sleeve of pullover shirt/dresss Upper body dressing/undressing: 2: Max-Patient completed 25-49% of tasks FIM - Lower Body Dressing/Undressing Lower body dressing/undressing steps patient completed: Thread/unthread right underwear leg, Thread/unthread right pants leg Lower body dressing/undressing: 2: Max-Patient completed 25-49% of tasks        FIM - Bed/Chair Transfer Bed/Chair Transfer Assistive Devices: HOB elevated Bed/Chair Transfer: 3: Supine > Sit: Mod A (lifting assist/Pt. 50-74%/lift 2 legs, 1: Two helpers, 3: Bed > Chair or W/C: Mod A (lift or lower assist)  FIM - Locomotion: Wheelchair Distance: 100 Locomotion: Wheelchair: 2: Travels 50 - 149 ft with moderate assistance (Pt: 50 - 74%) FIM - Locomotion: Ambulation Locomotion: Ambulation Assistive Devices: Other (comment) (HHA) Ambulation/Gait Assistance: 4: Min assist, 3: Mod assist Locomotion: Ambulation: 2: Travels 50 - 149 ft with moderate assistance (Pt: 50 - 74%)  Comprehension Comprehension Mode: Auditory Comprehension: 5-Understands basic 90% of the time/requires cueing < 10% of the time  Expression Expression Mode: Verbal Expression: 3-Expresses basic 50 - 74% of the time/requires cueing 25 - 50% of the time. Needs to repeat parts of sentences.  Social Interaction Social Interaction: 3-Interacts appropriately 50 - 74% of the time - May be physically or verbally  inappropriate.  Problem Solving Problem Solving: 2-Solves basic 25 - 49% of the time - needs direction more than half the time to initiate, plan or complete simple activities  Memory Memory: 2-Recognizes or recalls 25 - 49% of the time/requires cueing 51 - 75% of the time   Medical Problem List and Plan: 1. Functional deficits secondary to R-MCA infarct --patient and husband report intolerance of ASA due to severe dyspepsia. Discussed with Neuro NP and will change to plavix.   2.  DVT Prophylaxis/Anticoagulation: Pharmaceutical: Lovenox 3. Headaches/ Pain Management: Has ben getting IV fentanyl once daily and reports poor results with tylenol.  Will try oxycodone prn. May need to add Topamax for more consistent relief.   4. Mood: LCSW to follow for evaluation and support.   5. Neuropsych: This patient is capable of making decisions on her own behalf. 6. Skin/Wound Care: Routine pressure  relief measures.   7. Fluids/Electrolytes/Nutrition: Monitor I/O especially as on dysphagia diet. Check lytes in am. 8. HTN: Monitor BP every 8 hours. Continue metoprolol and lisinopril daily. Avoid hypotension.   9. Dyslipidemia: intolerant of statins   LOS (Days) 2 A FACE TO FACE EVALUATION WAS PERFORMED  Telly Jawad E 06/10/2014, 7:33 AM

## 2014-06-10 NOTE — Progress Notes (Signed)
Occupational Therapy Session Note  Patient Details  Name: Mary Le MRN: [id number] Date of Birth: 1946-02-03  Today's Date: 06/10/2014 OT Individual Time: 1000-1103 OT Individual Time Calculation (min): 63 min    Short Term Goals: Week 1:  OT Short Term Goal 1 (Week 1): Pt will perform all bathing sit to stand with min assist in the shower. OT Short Term Goal 2 (Week 1): Pt will perform UB dressing to donn pullover shirt with min assist following hemi techniques.  OT Short Term Goal 3 (Week 1): Pt will perform LB dressing with min assist following hemi techniques. OT Short Term Goal 4 (Week 1): Pt/family will return demonstrate PROM exercises for the LUE following handout. OT Short Term Goal 5 (Week 1): Pt will locate at least 3 items placed left of midline during selfcare tasks with no more than min questioing cueing.   Skilled Therapeutic Interventions/Progress Updates:    Pt performed toilet transfer with ambulation and hand held assist to the elevated toilet with min assist.  Pt then transferred to the shower seat in the walk-in shower with min assist also.  She was able to perform most bathing with min assist, needing hand over hand for washing the RUE and for washing the peri area.  Transferred out to the wheelchair at the sink for grooming and dressing tasks.  Mod demonstrational cueing for hemi techniques.  She demonstrates increased impulsivity at times and would not complete donning the LLE before beginning the right side.  Min assist for sit to stand but pt needing mod assist to pull underpants and pants over her hips.  Porvided 1/2 lap tray for LUE at end of session.    Therapy Documentation Precautions:  Precautions Precautions: Fall Precaution Comments: Lt shoulder subluxation risk, left neglect with right peripheral gaze Restrictions Weight Bearing Restrictions: No  Pain: Pain Assessment Pain Assessment: No/denies pain ADL: See FIM for current functional  status  Therapy/Group: Individual Therapy  Aviya Jarvie OTR/L 06/10/2014, 12:24 PM

## 2014-06-10 NOTE — IPOC Note (Addendum)
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   Left hemiparesis   Left-sided neglect     Functional Problem List: Nursing    PT Balance, Behavior, Endurance, Motor, Pain, Perception, Safety, Sensory, Skin Integrity  OT Balance, Cognition, Endurance, Motor, Perception, Safety, Sensory, Vision  SLP Cognition, Nutrition  TR Endurance, Motor, Safety, Skin Integrity       Basic ADL's: OT Eating, Grooming, Bathing, Dressing, Toileting     Advanced  ADL's: OT Simple Meal Preparation     Transfers: PT Bed Mobility, Bed to Chair, Car, Manufacturing systems engineer, Metallurgist: PT Ambulation, Emergency planning/management officer, Stairs     Additional Impairments: OT Fuctional Use of Upper Extremity  SLP Swallowing, Communication, Social Cognition expression Problem Solving, Memory, Attention, Awareness  TR      Anticipated Outcomes Item Anticipated Outcome  Self Feeding modified independent  Swallowing  Supervision   Basic self-care  supervision  Toileting  supervision   Bathroom Transfers supervision  Bowel/Bladder     Transfers  supervision  Locomotion  supervision  Communication  Supervision   Cognition  Min A   Pain     Safety/Judgment      Therapy Plan: PT Intensity: Minimum of 1-2 x/day ,45 to 90 minutes PT Frequency: 5 out of 7 days PT Duration Estimated Length of Stay: 12-14 days OT Intensity: Minimum of 1-2 x/day, 45 to 90 minutes OT Frequency: 5 out of 7 days OT Duration/Estimated Length of Stay: 12-14 days SLP Intensity: Minumum of 1-2 x/day, 30 to 90 minutes SLP Frequency: 5 out of 7 days SLP Duration/Estimated Length of Stay: 10-14 days  TR Duration/ELOS:  2 weeks TR Frequency:  Min  1 time per week >20 minutes        Team Interventions: Nursing Interventions    PT interventions Ambulation/gait training, Training and development officer, Cognitive remediation/compensation, Academic librarian, Discharge planning, Disease management/prevention, DME/adaptive equipment instruction, Functional mobility training, Neuromuscular re-education, Pain management, Patient/family education, Psychosocial support, Stair training, Therapeutic Activities, Therapeutic Exercise, UE/LE Strength taining/ROM, UE/LE Coordination activities, Visual/perceptual remediation/compensation, Wheelchair propulsion/positioning  OT Interventions Training and development officer, Cognitive remediation/compensation, Community reintegration, Discharge planning, Functional electrical stimulation, Functional mobility training, DME/adaptive equipment instruction, Disease mangement/prevention, Neuromuscular re-education, Self Care/advanced ADL retraining, Therapeutic Exercise, UE/LE Strength taining/ROM, Pain management, Patient/family education, Splinting/orthotics, UE/LE Coordination activities, Psychosocial support, Therapeutic Activities, Visual/perceptual remediation/compensation, Wheelchair propulsion/positioning  SLP Interventions Cognitive remediation/compensation, English as a second language teacher, Functional tasks, Environmental controls, Speech/Language facilitation, Patient/family education, Dysphagia/aspiration precaution training, Internal/external aids, Medication managment, Oral motor exercises, Therapeutic Activities  TR Interventions Recreation/leisure participation, Balance/Vestibular training, functional mobility, therapeutic activities, UE/LE strength/coordination, w/c mobility, community reintegration, cognitive retraining/compensation, pt/family education, adaptive equipment instruction/use, discharge planning, psychosocial support  SW/CM Interventions Discharge Planning, Barrister's clerk, Patient/Family Education     Team Discharge Planning: Destination: PT-Home ,OT- Home , SLP-Home Projected Follow-up: PT-Home health PT, OT-  Outpatient OT, SLP-Outpatient SLP Projected Equipment Needs: PT-To be determined, OT- 3 in 1 bedside comode, SLP-None recommended by SLP Equipment Details: PT- , OT-  Patient/family involved in discharge planning: PT- Patient, Family member/caregiver,  OT-Patient, Family member/caregiver, SLP-Patient  MD ELOS: 14-18d Medical Rehab Prognosis:  Good Assessment: 68 y.o. RH female with h/o HTN, morbid obesity, dyslipidemia who was admitted on  06/03/2014 with left-sided  weakness. Cranial CT scan negative and patient underwent CTA head/neck which revealed distal right M1 occlusion with question of low density right temporal, parietal lobe and insula.  she underwent endovacular revascularization of R-MCA with thrombectomy by Dr. Estanislado Pandy.  MRI of the brain shows acute infarct right MCA territory in the region of the insula, frontoparietal and high parietal region. Patient intubated thorough 11/15 due to acute respiratory failure.  MRA of the brain with no stenosis or aneurysm and return of flow in R-MCA territory with luxury perfusion.  2D echo showed EF of 65% and no wall motion abnormalities. Carotid Dopplers without significant ICA stenosis. BLE dopplers negative for DVT. TEE with normal LVF, no LAA and negative for PFO. Loop recorder was placed by Dr. [name] on 11/14    Now requiring 24/7 Rehab RN,MD, as well as CIR level PT, OT and SLP.  Treatment team will focus on ADLs and mobility with goals set at Sup  See Team Conference Notes for weekly updates to the plan of care

## 2014-06-10 NOTE — Progress Notes (Signed)
Speech Language Pathology Daily Session Note  Patient Details  Name: Mary Le MRN: [id number] Date of Birth: 1946-03-23  Today's Date: 06/10/2014 SLP Individual Time: 0830-0930 SLP Individual Time Calculation (min): 60 min  Short Term Goals: Week 1: SLP Short Term Goal 1 (Week 1): Pt will consume trials of Dys. 3 textures with Min A multimodal cues for buccal clearance over 3 consecutive sessions. SLP Short Term Goal 2 (Week 1): Pt will sustain attention to basic functional tasks for 15 minutes with Mod A multimodal cues. SLP Short Term Goal 3 (Week 1): Pt will attend to left field of environment during functional tasks with Mod A multimodal cues.  SLP Short Term Goal 4 (Week 1): Pt will demonstrate emergent awareness during functional tasks with Mod A multimodal cues.  SLP Short Term Goal 5 (Week 1): Pt will utilize external memory aids to recall new, daily information with Mod A multimodal cues. SLP Short Term Goal 6 (Week 1): Pt will increase overall intelligibility at the sentence level with Min A multimodal cues for utilization of increased vocal intensity and over articulation.   Skilled Therapeutic Interventions: Skilled treatment session focused on addressing dysphagia and cognitive goals.  SLP facilitated session with Mod assist to problem solve tray set up, as well as Min cues for recall and use of safe swallow strategies during PO intake of Dys 1 textures and thin liquids via cup; cues were effective at preventing overt s/s of aspiration throughout meal.  SLP also facilitated session with Max faded to Mod cues to visually attend to SLP, who was seated on the left, when speaking to her.  Continue with current plan of care.      FIM:  Comprehension Comprehension Mode: Auditory Comprehension: 5-Understands basic 90% of the time/requires cueing < 10% of the time Expression Expression Mode: Verbal Expression: 4-Expresses basic 75 - 89% of the time/requires cueing 10 - 24% of the  time. Needs helper to occlude trach/needs to repeat words. Social Interaction Social Interaction: 4-Interacts appropriately 75 - 89% of the time - Needs redirection for appropriate language or to initiate interaction. Problem Solving Problem Solving: 3-Solves basic 50 - 74% of the time/requires cueing 25 - 49% of the time Memory Memory: 3-Recognizes or recalls 50 - 74% of the time/requires cueing 25 - 49% of the time FIM - Eating Eating Activity: 5: Supervision/cues  Pain Pain Assessment Pain Assessment: No/denies pain  Therapy/Group: Individual Therapy  Carmelia Roller., CCC-SLP [phone]  [place] 06/10/2014, 3:29 PM

## 2014-06-11 ENCOUNTER — Inpatient Hospital Stay (HOSPITAL_COMMUNITY): Payer: Medicare Other | Admitting: *Deleted

## 2014-06-11 ENCOUNTER — Encounter (HOSPITAL_COMMUNITY): Payer: Medicare Other | Admitting: Occupational Therapy

## 2014-06-11 ENCOUNTER — Inpatient Hospital Stay (HOSPITAL_COMMUNITY): Payer: Medicare Other | Admitting: Speech Pathology

## 2014-06-11 DIAGNOSIS — I63511 Cerebral infarction due to unspecified occlusion or stenosis of right middle cerebral artery: Secondary | ICD-10-CM

## 2014-06-11 MED ORDER — SALINE SPRAY 0.65 % NA SOLN
1.0000 | Freq: Four times a day (QID) | NASAL | Status: DC | PRN
  Administered 2014-06-11 – 2014-06-13 (×2): 1 via NASAL
  Filled 2014-06-11: qty 44

## 2014-06-11 NOTE — Plan of Care (Signed)
Problem: RH BLADDER ELIMINATION Goal: RH STG MANAGE BLADDER WITH ASSISTANCE STG Manage Bladder With mod Assistance  Outcome: Progressing Goal: RH STG MANAGE BLADDER WITH MEDICATION WITH ASSISTANCE STG Manage Bladder With Medication With mod Assistance.  Outcome: Progressing  Problem: RH SAFETY Goal: RH STG ADHERE TO SAFETY PRECAUTIONS W/ASSISTANCE/DEVICE STG Adhere to Safety Precautions With mod Assistance/Device.  Outcome: Progressing Goal: RH STG DECREASED RISK OF FALL WITH ASSISTANCE STG Decreased Risk of Fall With mod Assistance.  Outcome: Progressing

## 2014-06-11 NOTE — Progress Notes (Signed)
SLP Cancellation Note  Patient Details Name: Mary Le MRN: [id number] DOB: 01-01-1946   Cancelled treatment:        SLP attempted to see pt for skilled ST.  Upon arrival, pt was asleep in bed with covers pulled up over her head and complaints of 7/10 headache.  SLP attempted to engage pt in therapies at bedside and encouraged pt to participate as able; however, pt declined due to pain.  RN made aware.  As a result, pt missed 45 min of skilled ST.                                                                                               Leonidas Boateng, Selinda Orion 06/11/2014, 4:24 PM

## 2014-06-11 NOTE — Progress Notes (Signed)
Occupational Therapy Session Note  Patient Details  Name: Mary Le MRN: [id number] Date of Birth: 03-23-1946  Today's Date: 06/11/2014 OT Individual Time: 1100-1210 OT Individual Time Calculation (min): 70 min    Short Term Goals: Week 1:  OT Short Term Goal 1 (Week 1): Pt will perform all bathing sit to stand with min assist in the shower. OT Short Term Goal 2 (Week 1): Pt will perform UB dressing to donn pullover shirt with min assist following hemi techniques.  OT Short Term Goal 3 (Week 1): Pt will perform LB dressing with min assist following hemi techniques. OT Short Term Goal 4 (Week 1): Pt/family will return demonstrate PROM exercises for the LUE following handout. OT Short Term Goal 5 (Week 1): Pt will locate at least 3 items placed left of midline during selfcare tasks with no more than min questioing cueing.   Skilled Therapeutic Interventions/Progress Updates:    Pt performed transfer supine to sit EOB with min assist.  She was able to ambulate to the shower with min guard assist using the quad cane but demonstrated slower sequence requiring verbal cueing to complete. She was able to perform most bathing with min assist, needing hand over hand for washing the RUE and min assist for sit to stand for washing the peri area. Transferred out to the wheelchair at the sink for grooming and dressing tasks. Mod demonstrational cueing for hemi techniques. She still demonstrates increased impulsivity at times and would not complete donning the LLE before beginning the right side. Min assist for sit to stand but pt needing mod assist to pull underpants and pants over her hips. Therapist provided assistance for donning socks and shoes secondary to decreased time.  Discussed setup of grab bars in the shower with pt's daughter as she plans to have some installed to finish session.     Therapy Documentation Precautions:  Precautions Precautions: Fall Precaution Comments: Lt shoulder  subluxation risk, left neglect with right peripheral gaze Restrictions Weight Bearing Restrictions: No  Pain: Pain Assessment Pain Assessment: No/denies pain ADL: See FIM for current functional status  Therapy/Group: Individual Therapy  Kalin Amrhein OTR/L 06/11/2014, 12:59 PM

## 2014-06-11 NOTE — Progress Notes (Signed)
Occupational Therapy Session Note  Patient Details  Name: Mary Le MRN: [id number] Date of Birth: 12-30-1945  Today's Date: 06/11/2014 OT Individual Time: -  1545-1630  (45 min) OT Individual Time Calculation (min): 45  min   Short Term Goals: Week 1:  OT Short Term Goal 1 (Week 1): Pt will perform all bathing sit to stand with min assist in the shower. OT Short Term Goal 2 (Week 1): Pt will perform UB dressing to donn pullover shirt with min assist following hemi techniques.  OT Short Term Goal 3 (Week 1): Pt will perform LB dressing with min assist following hemi techniques. OT Short Term Goal 4 (Week 1): Pt/family will return demonstrate PROM exercises for the LUE following handout. OT Short Term Goal 5 (Week 1): Pt will locate at least 3 items placed left of midline during selfcare tasks with no more than min questioing cueing.  Week 2:     Skilled Therapeutic Interventions/Progress Updates:    Addressed LUE NMRE.  Performed PROM,  And instructed in self  ROM with elbow flex, and extension, shoulder adduction, abduction, and forearm supination/pronation.  Provided instructions to husband and he and pt returned demonstration..  Left pt in room with husband and grand daughter.    Therapy Documentation Precautions:  Precautions Precautions: Fall Precaution Comments: Lt shoulder subluxation risk, left neglect with right peripheral gaze Restrictions Weight Bearing Restrictions: No     Pain:  3.19 LUE in deltoid region with movement        See FIM for current functional status  Therapy/Group: Individual Therapy  Lisa Roca 06/11/2014, 4:25 PM

## 2014-06-11 NOTE — Progress Notes (Signed)
Physical Therapy Session Note  Patient Details  Name: Mary Le MRN: [id number] Date of Birth: [birth year]   Skilled Therapeutic Interventions/Progress Updates:  Attempted PT tx at scheduled time, but pt c/o headache 6/10, eyes closed and rubbing temples. Pt just got pain meds, but would like to reattempt at later time. Will reattempt this afternoon.   Second attempt, pt continues to have headache, declining PT at this time. "I'll work hard tomorrow."     Kennieth Rad, PT, DPT 06/11/2014, 1:11 PM

## 2014-06-11 NOTE — Progress Notes (Signed)
Patient ID: Mary Le, female   DOB: November 09, 1945, 68 y.o.   MRN: [id number]  68 y.o. RH female with h/o HTN, morbid obesity, dyslipidemia who was admitted on  06/03/2014 with left-sided weakness.  Patient admitted for comprehensive inpatient rehabilitation with functional deficits secondary to R-MCA infarct.  Dr. Leonie Man recommended ASA for embolic infarct due to unknown etiology.  Subjective/Complaints: No pain C/os  Still with sore throat and Dry cough relieved wit robitussin Review of Systems - Negative except as above , denies SOB   Past Medical History  Diagnosis Date  . Hypertension   . Hypercholesteremia   . Back pain     arthritis     Intake/Output Summary (Last 24 hours) at 06/11/14 0940 Last data filed at 06/10/14 1958  Gross per 24 hour  Intake    600 ml  Output      0 ml  Net    600 ml    Patient Vitals for the past 24 hrs:  BP Temp Temp src Pulse Resp SpO2  06/11/14 0633 (!) 110/41 mmHg 98.2 F (36.8 C) Oral 60 18 98 %  06/10/14 2044 (!) 147/41 mmHg 98.8 F (37.1 C) Oral 72 17 97 %  06/10/14 1610 (!) 116/48 mmHg 98.5 F (36.9 C) Oral 69 16 100 %  06/10/14 1548 - - - - - 100 %    Objective: Vital Signs: Blood pressure 110/41, pulse 60, temperature 98.2 F (36.8 C), temperature source Oral, resp. rate 18, height _0  (1.702 m), weight 223 lb 6.4 oz (101.334 kg), SpO2 98 %. No results found. Results for orders placed or performed during the hospital encounter of 06/08/14 (from the past 72 hour(s))  CBC WITH DIFFERENTIAL     Status: None   Collection Time: 06/09/14  4:52 AM  Result Value Ref Range   WBC 9.5 4.0 - 10.5 K/uL   RBC 4.74 3.87 - 5.11 MIL/uL   Hemoglobin 13.1 12.0 - 15.0 g/dL   HCT 39.4 36.0 - 46.0 %   MCV 83.1 78.0 - 100.0 fL   MCH 27.6 26.0 - 34.0 pg   MCHC 33.2 30.0 - 36.0 g/dL   RDW 14.4 11.5 - 15.5 %   Platelets 246 150 - 400 K/uL   Neutrophils Relative % 62 43 - 77 %   Neutro Abs 5.8 1.7 - 7.7 K/uL   Lymphocytes Relative 31 12 - 46 %   Lymphs Abs 3.0 0.7 - 4.0 K/uL   Monocytes Relative 5 3 - 12 %   Monocytes Absolute 0.5 0.1 - 1.0 K/uL   Eosinophils Relative 2 0 - 5 %   Eosinophils Absolute 0.2 0.0 - 0.7 K/uL   Basophils Relative 0 0 - 1 %   Basophils Absolute 0.0 0.0 - 0.1 K/uL  Comprehensive metabolic panel     Status: Abnormal   Collection Time: 06/09/14  4:52 AM  Result Value Ref Range   Sodium 142 137 - 147 mEq/L   Potassium 3.8 3.7 - 5.3 mEq/L   Chloride 107 96 - 112 mEq/L   CO2 21 19 - 32 mEq/L   Glucose, Bld 94 70 - 99 mg/dL   BUN 12 6 - 23 mg/dL   Creatinine, Ser 0.68 0.50 - 1.10 mg/dL   Calcium 8.2 (L) 8.4 - 10.5 mg/dL   Total Protein 5.8 (L) 6.0 - 8.3 g/dL   Albumin 2.6 (L) 3.5 - 5.2 g/dL   AST 35 0 - 37 U/L   ALT 41 (H) 0 - 35  U/L   Alkaline Phosphatase 44 39 - 117 U/L   Total Bilirubin 0.5 0.3 - 1.2 mg/dL   GFR calc non Af Amer 89 (L) >90 mL/min   GFR calc Af Amer >90 >90 mL/min    Comment: (NOTE) The eGFR has been calculated using the CKD EPI equation. This calculation has not been validated in all clinical situations. eGFR's persistently <90 mL/min signify possible Chronic Kidney Disease.    Anion gap 14 5 - 15     HEENT: normal Cardio: RRR and no murmur Resp: CTA B/L and unlabored GI: BS positive and NT Extremity:  Pulses positive and No Edema Skin:   Intact Neuro: Lethargic, Cranial Nerve Abnormalities Left central VII, Abnormal Sensory absent LT LUE, Abnormal Motor 0/5 LUE, 3- LLE, 5/5 on Right, Tone:  Decreased LUE and Inattention Musc/Skel:  Other no pain with LUE ROM Gen NAD   Assessment/Plan: 1. Functional deficits secondary to R-MCA infarct --patient and husband report intolerance of ASA due to severe dyspepsia. Discussed with Neuro NP and will change to plavix.   2.  DVT Prophylaxis/Anticoagulation: Pharmaceutical: Lovenox 3. Headaches/ Pain Management: Has ben getting IV fentanyl once daily and reports poor results with tylenol.  Will try oxycodone prn. May need to add  Topamax for more consistent relief.   4.  HTN: Monitor BP every 8 hours. Continue metoprolol and lisinopril daily. Avoid hypotension.   5. Dyslipidemia: intolerant of statins   LOS (Days) 3 A FACE TO FACE EVALUATION WAS PERFORMED  Nyoka Cowden 06/11/2014, 9:37 AM

## 2014-06-12 ENCOUNTER — Encounter (HOSPITAL_COMMUNITY): Payer: Self-pay | Admitting: *Deleted

## 2014-06-12 ENCOUNTER — Inpatient Hospital Stay (HOSPITAL_COMMUNITY): Payer: Medicare Other | Admitting: Occupational Therapy

## 2014-06-12 NOTE — Plan of Care (Signed)
Problem: RH BLADDER ELIMINATION Goal: RH STG MANAGE BLADDER WITH ASSISTANCE STG Manage Bladder With mod Assistance  Outcome: Progressing

## 2014-06-12 NOTE — Plan of Care (Signed)
Problem: RH SAFETY Goal: RH STG ADHERE TO SAFETY PRECAUTIONS W/ASSISTANCE/DEVICE STG Adhere to Safety Precautions With mod. Assistance/Device.  Outcome: Progressing     

## 2014-06-12 NOTE — Progress Notes (Signed)
Occupational Therapy Session Note  Patient Details  Name: Mary Le MRN: [id number] Date of Birth: May 29, 1946  Today's Date: 06/12/2014 OT Individual Time: 1300-1355 OT Individual Time Calculation (min): 55 min    Skilled Therapeutic Interventions/Progress Updates:    Pt given Mohr sling for left shoulder subluxation.  She was able to wear and then ambulate with hand held assist down to the gym.  One in the gym worked on active shoulder flexion and elbow extension with the LUE.  Pt is able to exhibit trace shoulder movements using a tilted stool.  She tends to demonstrate compensatory strategies in the trunk when attempting to perform active shoulder movement thus needing mod demonstrational cueing to keep her torso still.  During end of session pt reported feeling really hot and needing to lay down.  Pt layed back on mat but was completely responsive.  BP checked at 106/54 in supine.  Transferred back to the wheelchair and returned to room.  Pt verbalized the need to toilet so therapist transferred her stand pivot back to the toilet with min assist.  Nursing notified of episode and they came in and supervised pt through toileting.  Discussed with pt's daughter and spouse the expectations of progress to take a long time in the left arm and encouraged them to continue working on PROM exercises with the pt.  Therapy Documentation Precautions:  Precautions Precautions: Fall Precaution Comments: Lt shoulder subluxation risk, left neglect with right peripheral gaze Restrictions Weight Bearing Restrictions: No  Vital Signs: Therapy Vitals Temp: 98.2 F (36.8 C) Temp Source: Oral Pulse Rate: 70 Resp: 18 BP: (!) 119/51 mmHg Patient Position (if appropriate): Sitting Oxygen Therapy SpO2: 100 % O2 Device: Not Delivered Pain: Pain Assessment Pain Assessment: No/denies pain ADL: See FIM for current functional status  Therapy/Group: Individual Therapy  [name] OTR/L 06/12/2014,  3:48 PM

## 2014-06-12 NOTE — Progress Notes (Signed)
Patient would like therapy to stop what they are doing if she complaints about being hot. Per patient it means her blood pressure is low.

## 2014-06-12 NOTE — Progress Notes (Signed)
Patient ID: Mary Le, female   DOB: 17-Nov-1945, 68 y.o.   MRN: [id number]  Patient ID: Mary Le, female   DOB: June 29, 1946, 68 y.o.   MRN: [id number]  06/12/14. 68 y.o. RH female with h/o HTN, morbid obesity, dyslipidemia who was admitted on  06/03/2014 with left-sided weakness.  Patient admitted for comprehensive inpatient rehabilitation with functional deficits secondary to R-MCA infarct. Neurology  recommended ASA for embolic infarct due to unknown etiology.  Subjective/Complaints: No pain C/os  Still with sore throat and Dry cough relieved with  Robitussin- improved Review of Systems - Negative except as above , denies SOB   Past Medical History  Diagnosis Date  . Hypertension   . Hypercholesteremia   . Back pain     arthritis     Intake/Output Summary (Last 24 hours) at 06/12/14 0757 Last data filed at 06/11/14 1245  Gross per 24 hour  Intake    240 ml  Output      0 ml  Net    240 ml    Patient Vitals for the past 24 hrs:  BP Temp Temp src Pulse  06/12/14 0630 - 98.2 F (36.8 C) Oral -  06/11/14 1236 (!) 113/51 mmHg - - 67    Objective: Vital Signs: Blood pressure 113/51, pulse 67, temperature 98.2 F (36.8 C), temperature source Oral, resp. rate 18, height 5\' 7"  (1.702 m), weight 223 lb 6.4 oz (101.334 kg), SpO2 98 %. No results found. No results found for this or any previous visit (from the past 72 hour(s)).   General- alert no c/os Chest-remains clear HEENT: normal Cardio: RRR and no murmur Resp: CTA B/L and unlabored GI: BS positive and NT Extremity:  Pulses positive and No Edema Skin:   Intact Neuro: Lethargic, Cranial Nerve Abnormalities Left central VII, Abnormal Sensory absent LT LUE, Abnormal Motor 0/5 LUE, 3- LLE, 5/5 on Right, Tone:  Decreased LUE and Inattention Musc/Skel:  Other no pain with LUE ROM Gen NAD   Assessment/Plan: 1. Functional deficits secondary to R-MCA infarct --patient and husband report intolerance of ASA due to severe  dyspepsia. Discussed with Neuro NP and will change to plavix.   2.  DVT Prophylaxis/Anticoagulation: Pharmaceutical: Lovenox 3. Headaches/ Pain Management: Has ben getting IV fentanyl once daily and reports poor results with tylenol.  Will try oxycodone prn. May need to add Topamax for more consistent relief.   4.  HTN: Monitor BP every 8 hours. Continue metoprolol and lisinopril daily. Avoid hypotension.   5. Dyslipidemia: intolerant of statins   LOS (Days) 4 A FACE TO FACE EVALUATION WAS PERFORMED  Nyoka Cowden 06/12/2014, 7:57 AM

## 2014-06-13 ENCOUNTER — Inpatient Hospital Stay (HOSPITAL_COMMUNITY): Payer: Medicare Other | Admitting: Speech Pathology

## 2014-06-13 ENCOUNTER — Inpatient Hospital Stay (HOSPITAL_COMMUNITY): Payer: Medicare Other | Admitting: Physical Therapy

## 2014-06-13 ENCOUNTER — Encounter (HOSPITAL_COMMUNITY): Payer: Medicare Other

## 2014-06-13 DIAGNOSIS — K5901 Slow transit constipation: Secondary | ICD-10-CM

## 2014-06-13 MED ORDER — SENNOSIDES-DOCUSATE SODIUM 8.6-50 MG PO TABS
2.0000 | ORAL_TABLET | Freq: Two times a day (BID) | ORAL | Status: DC
  Administered 2014-06-13 – 2014-06-14 (×3): 2 via ORAL
  Filled 2014-06-13 (×3): qty 2

## 2014-06-13 NOTE — Progress Notes (Signed)
Occupational Therapy Session Note  Patient Details  Name: Mary Le MRN: [id number] Date of Birth: [birth year]  Today's Date: 06/13/2014 OT Individual Time: 1000-1100 OT Individual Time Calculation (min): 60 min    Short Term Goals: Week 1:  OT Short Term Goal 1 (Week 1): Pt will perform all bathing sit to stand with min assist in the shower. OT Short Term Goal 2 (Week 1): Pt will perform UB dressing to donn pullover shirt with min assist following hemi techniques.  OT Short Term Goal 3 (Week 1): Pt will perform LB dressing with min assist following hemi techniques. OT Short Term Goal 4 (Week 1): Pt/family will return demonstrate PROM exercises for the LUE following handout. OT Short Term Goal 5 (Week 1): Pt will locate at least 3 items placed left of midline during selfcare tasks with no more than min questioing cueing.   Skilled Therapeutic Interventions/Progress Updates:    Pt engaged in BADL retraining including bathing at shower level and dressing with sit<>stand from chair.  Pt amb with HHA into and out of bathroom.  Pt required max verbal cues to attend to left and to avoid bumping into objects on left.  Pt required min verbal cues for initiation and sequencing during bathing.  Pt initiated use of LUE during bathing tasks but required [facility] assist to complete tasks.  Pt required tot A to thread LUE into shirt sleeve.  Pt stood at sink to complete oral care before returning to recliner.  Focus on activity tolerance, safety awareness, attention to left, task initiation, sequencing, and safety awareness.  Therapy Documentation Precautions:  Precautions Precautions: Fall Precaution Comments: Lt shoulder subluxation risk, left neglect with right peripheral gaze Restrictions Weight Bearing Restrictions: No Pain: Pain Assessment Pain Assessment: No/denies pain  See FIM for current functional status  Therapy/Group: Individual Therapy  Leroy Libman 06/13/2014, 12:03 PM

## 2014-06-13 NOTE — Progress Notes (Signed)
Physical Therapy Session Note  Patient Details  Name: Mary Le MRN: [id number] Date of Birth: 05-26-1946  Today's Date: 06/13/2014 PT Individual Time: 0830-0930 PT Individual Time Calculation (min): 60 min   Short Term Goals: Week 1:  PT Short Term Goal 1 (Week 1): = LTGs of overall supervision  Skilled Therapeutic Interventions/Progress Updates:   Pt received semi reclined in bed, agreeable to therapy. Pt transferred supine > sit with HOB elevated and min A, verbal cues for scooting to edge of bed. Therapist donned pt's shoes while seated EOB. Gait training 2 x 150 ft with R HHA, therapist facilitating weight shift to R and max verbal cues for left heel strike for improved L LE clearance and decreased shuffling. Pt requires min-mod A for gait this session and able to verbalize when obstacles were on left side but unable to avoid obstacles, bumping into obstacles x 3 on L side despite cues. For NMR, pt performed tall kneeling on mat table with small bench in front for UE support with min A. Pt maintained position with RUE supported and LUE placed on bench and verbal/tactile cues for hip extension and upright posture. Pt with c/o feeling hot, returned to sitting and vitals assessed, see below. Pt agreeable to attempting again once she felt better. Pt achieved tall kneeling position for second time, reporting need for bathroom. Pt ambulated back to room and performed toilet transfer with min A. Pt performed hygiene and needed min A to manage left side of clothing. Pt reporting feeling hot again, ambulated to side of bed with R HHA for seated rest break. Pt ambulated to sink to wash hands with supervision and cues to scan to L to find paper towels. Pt required another seated rest break, c/o "slight headache." In pt's room, performed side stepping to R and to L to work on weight shifting and forward/retro ambulation with B HHA. Pt left sitting in recliner with all needs within reach.   Therapy  Documentation Precautions:  Precautions Precautions: Fall Precaution Comments: Lt shoulder subluxation risk, left neglect with right peripheral gaze Restrictions Weight Bearing Restrictions: No Vital Signs: Therapy Vitals Pulse Rate: 86 BP: (!) 115/54 mmHg Patient Position (if appropriate): Sitting Oxygen Therapy SpO2: 99 % O2 Device: Not Delivered Pain: Pain Assessment Pain Assessment: No/denies pain Locomotion : Ambulation Ambulation/Gait Assistance: 4: Min assist;3: Mod assist   See FIM for current functional status  Therapy/Group: Individual Therapy  [name] 06/13/2014, 9:55 AM

## 2014-06-13 NOTE — Plan of Care (Signed)
Problem: RH BLADDER ELIMINATION Goal: RH STG MANAGE BLADDER WITH ASSISTANCE STG Manage Bladder With mod Assistance  Outcome: Progressing Goal: RH STG MANAGE BLADDER WITH MEDICATION WITH ASSISTANCE STG Manage Bladder With Medication With mod Assistance.  Outcome: Progressing  Problem: RH SAFETY Goal: RH STG ADHERE TO SAFETY PRECAUTIONS W/ASSISTANCE/DEVICE STG Adhere to Safety Precautions With mod Assistance/Device.  Outcome: Progressing Goal: RH STG DECREASED RISK OF FALL WITH ASSISTANCE STG Decreased Risk of Fall With mod Assistance.  Outcome: Progressing

## 2014-06-13 NOTE — Progress Notes (Signed)
68 y.o. RH female with h/o HTN, morbid obesity, dyslipidemia who was admitted on  06/03/2014 with left-sided weakness. Cranial CT scan negative and patient underwent CTA head/neck which revealed distal right M1 occlusion with question of low density right temporal, parietal lobe and insula.  she underwent endovacular revascularization of R-MCA with thrombectomy by Dr. Estanislado Pandy.  MRI of the brain shows acute infarct right MCA territory in the region of the insula, frontoparietal and high parietal region. Patient intubated thorough 11/15 due to acute respiratory failure.  MRA of the brain with no stenosis or aneurysm and return of flow in R-MCA territory with luxury perfusion.  2D echo showed EF of 65% and no wall motion abnormalities. Carotid Dopplers without significant ICA stenosis. BLE dopplers negative for DVT. TEE with normal LVF, no LAA and negative for PFO. Loop recorder was placed by Dr. [name] on 11/14. BSE done and patient started on dysphagia 2 with thin liquids. Patient with resultant L-HH, right gaze preference, left facial droop with mild dysarthria as well as right hemiparesis. Dr. Leonie Man recommended ASA for embolic infarct due to unknown etiology.  Subjective/Complaints: No pain issues , would like IV out, no longer receiving IV fentanyl, + constipation Review of Systems - Negative except as above , denies SOB Objective: Vital Signs: Blood pressure 115/51, pulse 66, temperature 97.8 F (36.6 C), temperature source Oral, resp. rate 17, height 5\' 7"  (1.702 m), weight 101.334 kg (223 lb 6.4 oz), SpO2 100 %. No results found. No results found for this or any previous visit (from the past 72 hour(s)).   HEENT: normal Cardio: RRR and no murmur Resp: CTA B/L and unlabored GI: BS positive and NT Extremity:  Pulses positive and No Edema Skin:   Intact Neuro: Lethargic, Cranial Nerve Abnormalities Left central VII, Abnormal Sensory absent LT LUE, Abnormal Motor 0/5 LUE, 3- LLE, 5/5 on Right,  Tone:  Decreased LUE and Inattention Musc/Skel:  Other no pain with LUE ROM Gen NAD   Assessment/Plan: 1. Functional deficits secondary to Right MCA infarct uncertain etiology which require 3+ hours per day of interdisciplinary therapy in a comprehensive inpatient rehab setting. Physiatrist is providing close team supervision and 24 hour management of active medical problems listed below. Physiatrist and rehab team continue to assess barriers to discharge/monitor patient progress toward functional and medical goals. FIM: FIM - Bathing Bathing Steps Patient Completed: Chest, Left Arm, Abdomen, Front perineal area, Right upper leg, Left upper leg, Right lower leg (including foot) Bathing: 3: Mod-Patient completes 5-7 15f 10 parts or 50-74%  FIM - Upper Body Dressing/Undressing Upper body dressing/undressing steps patient completed: Pull shirt over trunk Upper body dressing/undressing: 2: Max-Patient completed 25-49% of tasks FIM - Lower Body Dressing/Undressing Lower body dressing/undressing steps patient completed: Thread/unthread right underwear leg, Thread/unthread right pants leg, Thread/unthread left underwear leg, Thread/unthread left pants leg Lower body dressing/undressing: 3: Mod-Patient completed 50-74% of tasks     FIM - Radio producer Devices: Elevated toilet seat, Grab bars Toilet Transfers: 4-To toilet/BSC: Min A (steadying Pt. > 75%), 4-From toilet/BSC: Min A (steadying Pt. > 75%)  FIM - Bed/Chair Transfer Bed/Chair Transfer Assistive Devices: HOB elevated Bed/Chair Transfer: 4: Sit > Supine: Min A (steadying pt. > 75%/lift 1 leg), 4: Chair or W/C > Bed: Min A (steadying Pt. > 75%), 4: Bed > Chair or W/C: Min A (steadying Pt. > 75%)  FIM - Locomotion: Wheelchair Distance: 150 Locomotion: Wheelchair: 3: Travels 150 ft or more: maneuvers on rugs and over  door sills with moderate assistance  (Pt: 50 - 74%) FIM - Locomotion:  Ambulation Locomotion: Ambulation Assistive Devices: Other (comment) (HHA) Ambulation/Gait Assistance: 4: Min guard, 4: Min assist Locomotion: Ambulation: 4: Travels 150 ft or more with minimal assistance (Pt.>75%)  Comprehension Comprehension Mode: Auditory Comprehension: 5-Understands basic 90% of the time/requires cueing < 10% of the time  Expression Expression Mode: Verbal Expression: 4-Expresses basic 75 - 89% of the time/requires cueing 10 - 24% of the time. Needs helper to occlude trach/needs to repeat words.  Social Interaction Social Interaction: 4-Interacts appropriately 75 - 89% of the time - Needs redirection for appropriate language or to initiate interaction.  Problem Solving Problem Solving: 3-Solves basic 50 - 74% of the time/requires cueing 25 - 49% of the time  Memory Memory: 3-Recognizes or recalls 50 - 74% of the time/requires cueing 25 - 49% of the time   Medical Problem List and Plan: 1. Functional deficits secondary to R-MCA infarct --on plavix secondary to ASA intolerance 2.  DVT Prophylaxis/Anticoagulation: Pharmaceutical: Lovenox 3. Headaches/ Pain Management:  Will try oxycodone prn. May need to add Topamax for more consistent relief.   4. Mood: LCSW to follow for evaluation and support.   5. Neuropsych: This patient is capable of making decisions on her own behalf. 6. Skin/Wound Care: Routine pressure relief measures.   7. Fluids/Electrolytes/Nutrition: Monitor I/O especially as on dysphagia diet. Check lytes in am. 8. HTN: Monitor BP every 8 hours. Continue metoprolol and lisinopril daily. Avoid hypotension.   9. Dyslipidemia: intolerant of statins 10.  Constipation- adjust Bowel prog  LOS (Days) 5 A FACE TO FACE EVALUATION WAS PERFORMED  Mary Le E 06/13/2014, 6:56 AM

## 2014-06-13 NOTE — Progress Notes (Signed)
Speech Language Pathology Daily Session Note  Patient Details  Name: Mary Le MRN: [id number] Date of Birth: 10/19/1945  Today's Date: 06/13/2014 SLP Individual Time: 1400-1500 SLP Individual Time Calculation (min): 60 min  Short Term Goals: Week 1: SLP Short Term Goal 1 (Week 1): Pt will consume trials of Dys. 3 textures with Min A multimodal cues for buccal clearance over 3 consecutive sessions. SLP Short Term Goal 2 (Week 1): Pt will sustain attention to basic functional tasks for 15 minutes with Mod A multimodal cues. SLP Short Term Goal 3 (Week 1): Pt will attend to left field of environment during functional tasks with Mod A multimodal cues.  SLP Short Term Goal 4 (Week 1): Pt will demonstrate emergent awareness during functional tasks with Mod A multimodal cues.  SLP Short Term Goal 5 (Week 1): Pt will utilize external memory aids to recall new, daily information with Mod A multimodal cues. SLP Short Term Goal 6 (Week 1): Pt will increase overall intelligibility at the sentence level with Min A multimodal cues for utilization of increased vocal intensity and over articulation.   Skilled Therapeutic Interventions: Skilled treatment session focused on addressing diagnostic treatment of reading, writing and higher level cognitive abilities.  Patient required Mod assist for left attention during reading and writing tasks as well as to self-monitor and correct errors during moderately complex problem solving tasks.  Continue with current plan of care.    FIM:  Comprehension Comprehension Mode: Auditory Comprehension: 5-Understands basic 90% of the time/requires cueing < 10% of the time Expression Expression Mode: Verbal Expression: 4-Expresses basic 75 - 89% of the time/requires cueing 10 - 24% of the time. Needs helper to occlude trach/needs to repeat words. Social Interaction Social Interaction: 4-Interacts appropriately 75 - 89% of the time - Needs redirection for appropriate  language or to initiate interaction. Problem Solving Problem Solving: 3-Solves basic 50 - 74% of the time/requires cueing 25 - 49% of the time Memory Memory: 3-Recognizes or recalls 50 - 74% of the time/requires cueing 25 - 49% of the time  Pain Pain Assessment Pain Assessment: No/denies pain  Therapy/Group: Individual Therapy  Carmelia Roller., CCC-SLP [phone]  [place] 06/13/2014, 4:52 PM

## 2014-06-14 ENCOUNTER — Encounter (HOSPITAL_COMMUNITY): Payer: Medicare Other

## 2014-06-14 ENCOUNTER — Inpatient Hospital Stay (HOSPITAL_COMMUNITY): Payer: Medicare Other | Admitting: Rehabilitation

## 2014-06-14 ENCOUNTER — Inpatient Hospital Stay (HOSPITAL_COMMUNITY): Payer: Medicare Other | Admitting: Speech Pathology

## 2014-06-14 ENCOUNTER — Inpatient Hospital Stay (HOSPITAL_COMMUNITY): Payer: Medicare Other | Admitting: Physical Therapy

## 2014-06-14 ENCOUNTER — Inpatient Hospital Stay (HOSPITAL_COMMUNITY): Payer: Medicare Other | Admitting: *Deleted

## 2014-06-14 MED ORDER — FLEET ENEMA 7-19 GM/118ML RE ENEM
1.0000 | ENEMA | Freq: Once | RECTAL | Status: AC
Start: 2014-06-14 — End: 2014-06-14
  Administered 2014-06-14: 1 via RECTAL
  Filled 2014-06-14: qty 1

## 2014-06-14 MED ORDER — SENNOSIDES-DOCUSATE SODIUM 8.6-50 MG PO TABS
3.0000 | ORAL_TABLET | Freq: Two times a day (BID) | ORAL | Status: DC
  Administered 2014-06-14 – 2014-06-23 (×18): 3 via ORAL
  Filled 2014-06-14 (×18): qty 3

## 2014-06-14 MED ORDER — ENSURE COMPLETE PO LIQD
237.0000 mL | Freq: Two times a day (BID) | ORAL | Status: DC
  Administered 2014-06-14 – 2014-06-17 (×5): 237 mL via ORAL

## 2014-06-14 MED ORDER — LISINOPRIL 10 MG PO TABS
10.0000 mg | ORAL_TABLET | Freq: Every day | ORAL | Status: DC
  Filled 2014-06-14: qty 1

## 2014-06-14 NOTE — Progress Notes (Signed)
Last BM 11-18. Order for Fleets enema 1600 which was completed. No results from enema. Notified [name] Love, PA with no new orders at this time. Pt has scheduled senna BID.  Pt denies any discomfort or tightness. Will reassess in the morning if no BM tonight. Will cont. To monitor.

## 2014-06-14 NOTE — Plan of Care (Signed)
Problem: RH BOWEL ELIMINATION Goal: RH STG MANAGE BOWEL W/MEDICATION W/ASSISTANCE STG Manage Bowel with Medication with min Assistance.  Outcome: Progressing     

## 2014-06-14 NOTE — Progress Notes (Signed)
Occupational Therapy Session Note  Patient Details  Name: Mary Le MRN: [id number] Date of Birth: [birth year]  Today's Date: 06/14/2014 OT Individual Time: 0900-1000 OT Individual Time Calculation (min): 60 min    Short Term Goals: Week 1:  OT Short Term Goal 1 (Week 1): Pt will perform all bathing sit to stand with min assist in the shower. OT Short Term Goal 2 (Week 1): Pt will perform UB dressing to donn pullover shirt with min assist following hemi techniques.  OT Short Term Goal 3 (Week 1): Pt will perform LB dressing with min assist following hemi techniques. OT Short Term Goal 4 (Week 1): Pt/family will return demonstrate PROM exercises for the LUE following handout. OT Short Term Goal 5 (Week 1): Pt will locate at least 3 items placed left of midline during selfcare tasks with no more than min questioing cueing.   Skilled Therapeutic Interventions/Progress Updates:    Pt engaged in BADL retraining including bathing at shower level, toileting, toilet transfers, shower transfers, and dressing with sit<>stand from chair. Pt amb with HHA into and out of bathroom to use toilet and transfer to shower for bathing tasks.  Pt requires HOH assist when using LUE to complete bathing tasks.  Pt required max verbal cues to avoid obstacles on left when ambulating in room and bathroom.  Pt required mod verbal cues to attend to LUE during session.  Pt recalls correct hemi dressing techniques but requires assistance to complete tasks.  Focus on activity tolerance, sit<>stand, standing balance, functional amb, LUE use during functional tasks, safety awareness, attention to left, and safety awareness.  Therapy Documentation Precautions:  Precautions Precautions: Fall Precaution Comments: Lt shoulder subluxation risk, left neglect with right peripheral gaze Restrictions Weight Bearing Restrictions: No Pain: Pain Assessment Pain Assessment: No/denies pain  See FIM for current functional  status  Therapy/Group: Individual Therapy  Leroy Libman 06/14/2014, 10:03 AM

## 2014-06-14 NOTE — Progress Notes (Signed)
Physical Therapy Session Note  Patient Details  Name: Mary Le MRN: [id number] Date of Birth: 1946-05-07  Today's Date: 06/14/2014 PT Individual Time: 7622-6333 PT Individual Time Calculation (min): 60 min   Short Term Goals: Week 1:  PT Short Term Goal 1 (Week 1): = LTGs of overall supervision  Skilled Therapeutic Interventions/Progress Updates:    Pt received seated in recliner; agreeable to therapy. Session focused on increasing lateral weight shift to R side to increase gait stability. Made RN aware of ongoing symptoms of orthostatic hypotension. RN notified PA. Per MD order, donned Ted hose. Pt asymptomatic during this session.  Donned L Giv[employer]r sling. Pt performed gait x130' then x150' (seated rest breaks between trials) in controlled environment with min A, R HHA, verbal cueing for increased LLE step length; visual/verbal cueing for increased lateral weight shift to R side with reinforcement required throughout session. See below for detailed description of NMR. Returned to pt room, where pt requesting to return to bed due to fatigue. Performed sit>supine x3 trials total with mod A and HOB flat, no rails; verbal/tactile cueing required for technique, bilat LE management. Departed with pt semi reclined in bed with 2 trails up, bed alarm on, and all needs within reach.  Therapy Documentation Precautions:  Precautions Precautions: Fall Precaution Comments: Lt shoulder subluxation risk, left neglect with right peripheral gaze Restrictions Weight Bearing Restrictions: No Vital Signs: Therapy Vitals Pulse Rate: 67 BP: (!) 121/50 mmHg Patient Position (if appropriate): Lying Pain: Pain Assessment Pain Assessment: No/denies pain Locomotion : Ambulation Ambulation/Gait Assistance: 4: Min assist  NMR: Neuromuscular Facilitation: Activity to increase lateral weight shifting;Activity to increase grading;Lower Extremity;Left Pt engaged in game of tic-tac-toe then hang man while   standing in front of mirror with 1" step under LLE to promote lateral weight shift to R side during dynamic standing. Verbal cueing required to address pt tendency to lift R foot from ground. Pt required to write to R of midline to promote lateral weight shift to R; portion of game positioned to L of midline to promote scanning to L visual field. Longest standing trial was 4.5 minutes; trials ended due to pt fatigue. Transitioned to L toe taps on 4.5" step x18 reps total to facilitate lateral weight shift to R.   See FIM for current functional status  Therapy/Group: Individual Therapy  Nikky Duba, Malva Cogan 06/14/2014, 11:49 AM

## 2014-06-14 NOTE — Progress Notes (Signed)
Physical Therapy Session Note  Patient Details  Name: Mary Le MRN: [id number] Date of Birth: 10-31-1945  Today's Date: 06/14/2014 PT Individual Time: 9485-4627 PT Individual Time Calculation (min): 47 min   Short Term Goals: Week 1:  PT Short Term Goal 1 (Week 1): = LTGs of overall supervision  Skilled Therapeutic Interventions/Progress Updates:   Pt received lying in bed, agreeable to therapy.  Placed HOB flat and lowered rails to simulate home.  Pt requires max A cues and max A for problem solving how to roll to side then lower LEs and elevate trunk into sitting.  Pt requires some assist for LLE and mod A for elevating trunk due to poor trunk rotation and body habitus limiting ability to roll.  Once at EOB, assisted with donning shoes.  Pt ambulated to/from restroom with R HHA.  Requires assist for adjusting clothing prior to and following toileting, but completed peri care on her own.  Ambulated back to sink to wash hands.  Max cues to incorporate LUE into hand hygiene.  Ambulated most of way to gym, but needed seated rest break by elevators (100' x 1 and another 26' x 1) with min A (R HHA).  Min facilitation for increased weight shift to the R for better LLE clearance.  Cues for attending to the L during gait to avoid obstacles.  Once in therapy gym, performed sit<>stand x 8 reps with small block under RLE to increase weight shift and weight bearing through LLE. Initially used hands on lap to stand, then with no UE support to further challenge glutes/quads.  Then progressed to standing task, performing mini squats to reach for clothes pins and placing them far to the L for increased WB through LLE and trunk rotation.  Pt with increased c/o fatigue, pain in low back and slight head ache.  BP was 111/90, daughter states bottom number is not that high usually.  Retrieved w/c and notified RN of BP.  Assisted back to room and back to bed with min A and mod A for BLEs into bed.  Pt left in bed with all  needs in reach.  RN in room.    Therapy Documentation Precautions:  Precautions Precautions: Fall Precaution Comments: Lt shoulder subluxation risk, left neglect with right peripheral gaze Restrictions Weight Bearing Restrictions: No General: PT Amount of Missed Time (min): 13 Minutes PT Missed Treatment Reason: Patient fatigue;Pain (BP) Vital Signs: Therapy Vitals Temp: 98.2 F (36.8 C) Temp Source: Oral Pulse Rate: 79 Resp: 18 BP: 111/90 mmHg (following standing task) Patient Position (if appropriate): Sitting Oxygen Therapy SpO2: 100 % O2 Device: Not Delivered Pain: Pain Assessment Pain Assessment: No/denies pain   Locomotion : Ambulation Ambulation/Gait Assistance: 4: Min assist   See FIM for current functional status  Therapy/Group: Individual Therapy  Denice Bors 06/14/2014, 4:04 PM

## 2014-06-14 NOTE — Plan of Care (Signed)
Problem: RH SAFETY Goal: RH STG ADHERE TO SAFETY PRECAUTIONS W/ASSISTANCE/DEVICE STG Adhere to Safety Precautions With mod Assistance/Device.  Outcome: Progressing Goal: RH STG DECREASED RISK OF FALL WITH ASSISTANCE STG Decreased Risk of Fall With mod Assistance.  Outcome: Progressing  Problem: RH Vision Goal: RH LTG Vision (Specify) Outcome: Progressing

## 2014-06-14 NOTE — Plan of Care (Signed)
Problem: RH BOWEL ELIMINATION Goal: RH STG MANAGE BOWEL WITH ASSISTANCE STG Manage Bowel with min Assistance. Outcome: Not Progressing Patient last BM 06-08-14 interventions in place.

## 2014-06-14 NOTE — Progress Notes (Signed)
Recreational Therapy Session Note  Patient Details  Name: Mary Le MRN: [id number] Date of Birth: May 01, 1946 Today's Date: 06/14/2014  Pain: no c/o Skilled Therapeutic Interventions/Progress Updates: Pt stated that she enjoyed cooking during leisure discussion.  Discussion included creating an ingredient list & listing steps in baking a pound cake.  Pt required min cues for intelligeable speech throughout.  Therapy/Group: Individual Therapy   Mary Le 06/14/2014, 3:24 PM

## 2014-06-14 NOTE — Progress Notes (Signed)
Speech Language Pathology Daily Session Note  Patient Details  Name: Kierrah Kilbride MRN: [id number] Date of Birth: 1945-12-02  Today's Date: 06/14/2014 SLP Individual Time: 1133-1203; 9480-1655  SLP Individual Time Calculation (min): 30 min; 30 min  Short Term Goals: Week 1: SLP Short Term Goal 1 (Week 1): Pt will consume trials of Dys. 3 textures with Min A multimodal cues for buccal clearance over 3 consecutive sessions. SLP Short Term Goal 2 (Week 1): Pt will sustain attention to basic functional tasks for 15 minutes with Mod A multimodal cues. SLP Short Term Goal 3 (Week 1): Pt will attend to left field of environment during functional tasks with Mod A multimodal cues.  SLP Short Term Goal 4 (Week 1): Pt will demonstrate emergent awareness during functional tasks with Mod A multimodal cues.  SLP Short Term Goal 5 (Week 1): Pt will utilize external memory aids to recall new, daily information with Mod A multimodal cues. SLP Short Term Goal 6 (Week 1): Pt will increase overall intelligibility at the sentence level with Min A multimodal cues for utilization of increased vocal intensity and over articulation.   Skilled Therapeutic Interventions:  Session 1: Pt was seen for skilled ST targeting cognitive goals.  Upon arrival, pt was seated upright in bed, awake, alert, and pleasantly interactive.  SLP facilitated the session with a basic, new learning activity targeting visual scanning to the left and sustained attention.  Pt required overall max faded to mod assist multimodal cues to complete the abovementioned activity due to decreased working memory.  Pt was able to identify difficulty with task with overall min question cues.    Session 2: Pt was seen for skilled ST targeting cognitive goals.  Upon arrival, pt was seated upright in bed, awake, alert, and agreeable to participate in [place].  Pt's family was present for the duration of today's therapy session and remained actively engaged in all  therapeutic activities.  SLP facilitated the session with basic money management tasks targeting functional problem solving, sustained attention, and visual scanning to the left.  Pt sorted coins into groups based on values and was able to count coins in groups with min assist; however, she required max assist multimodal cuing to generate totals with coins when named due to decreased working memory, decreased attention, and decreased error awareness.   Pt is making good progress towards meeting goals.  Continue per current plan of care.    FIM:  Comprehension Comprehension Mode: Auditory Comprehension: 5-Understands basic 90% of the time/requires cueing < 10% of the time Expression Expression Mode: Verbal Expression: 4-Expresses basic 75 - 89% of the time/requires cueing 10 - 24% of the time. Needs helper to occlude trach/needs to repeat words. Social Interaction Social Interaction: 4-Interacts appropriately 75 - 89% of the time - Needs redirection for appropriate language or to initiate interaction. Problem Solving Problem Solving: 3-Solves basic 50 - 74% of the time/requires cueing 25 - 49% of the time Memory Memory: 3-Recognizes or recalls 50 - 74% of the time/requires cueing 25 - 49% of the time  Pain Pain Assessment Session 1: Pain Assessment: No/denies pain Session 2: Pain Assessment: No/denies pain  Therapy/Group: Individual Therapy   Windell Moulding, M.A. CCC-SLP  Draden Cottingham, Selinda Orion 06/14/2014, 3:50 PM

## 2014-06-14 NOTE — Progress Notes (Signed)
68 y.o. RH female with h/o HTN, morbid obesity, dyslipidemia who was admitted on  06/03/2014 with left-sided weakness. Cranial CT scan negative and patient underwent CTA head/neck which revealed distal right M1 occlusion with question of low density right temporal, parietal lobe and insula.  she underwent endovacular revascularization of R-MCA with thrombectomy by Dr. Estanislado Pandy.  MRI of the brain shows acute infarct right MCA territory in the region of the insula, frontoparietal and high parietal region. Patient intubated thorough 11/15 due to acute respiratory failure.  MRA of the brain with no stenosis or aneurysm and return of flow in R-MCA territory with luxury perfusion.  2D echo showed EF of 65% and no wall motion abnormalities. Carotid Dopplers without significant ICA stenosis. BLE dopplers negative for DVT. TEE with normal LVF, no LAA and negative for PFO. Loop recorder was placed by Dr. [name] on 11/14. BSE done and patient started on dysphagia 2 with thin liquids. Patient with resultant L-HH, right gaze preference, left facial droop with mild dysarthria as well as right hemiparesis. Dr. Leonie Man recommended ASA for embolic infarct due to unknown etiology.  Subjective/Complaints: , + constipation still no results per pt, no abd pain , no N/V Review of Systems - Negative except as above , denies SOB Objective: Vital Signs: Blood pressure 121/50, pulse 67, temperature 98.3 F (36.8 C), temperature source Oral, resp. rate 18, height 5\' 7"  (1.702 m), weight 101.334 kg (223 lb 6.4 oz), SpO2 98 %. No results found. No results found for this or any previous visit (from the past 72 hour(s)).   HEENT: normal Cardio: RRR and no murmur Resp: CTA B/L and unlabored GI: BS positive and NT Extremity:  Pulses positive and No Edema Skin:   Intact Neuro: Lethargic, Cranial Nerve Abnormalities Left central VII, Abnormal Sensory absent LT LUE, Abnormal Motor 0/5 LUE, 3- LLE, 5/5 on Right, Tone:  Increased Left  biceps , severe L Inattention Musc/Skel:  Other no pain with LUE ROM Gen NAD   Assessment/Plan: 1. Functional deficits secondary to Right MCA infarct uncertain etiology which require 3+ hours per day of interdisciplinary therapy in a comprehensive inpatient rehab setting. Physiatrist is providing close team supervision and 24 hour management of active medical problems listed below. Physiatrist and rehab team continue to assess barriers to discharge/monitor patient progress toward functional and medical goals. FIM: FIM - Bathing Bathing Steps Patient Completed: Chest, Left Arm, Abdomen, Front perineal area, Right upper leg, Left upper leg Bathing: 3: Mod-Patient completes 5-7 48f 10 parts or 50-74%  FIM - Upper Body Dressing/Undressing Upper body dressing/undressing steps patient completed: Put head through opening of pull over shirt/dress Upper body dressing/undressing: 2: Max-Patient completed 25-49% of tasks FIM - Lower Body Dressing/Undressing Lower body dressing/undressing steps patient completed: Thread/unthread right underwear leg, Thread/unthread right pants leg, Thread/unthread left underwear leg, Thread/unthread left pants leg Lower body dressing/undressing: 3: Mod-Patient completed 50-74% of tasks  FIM - Toileting Toileting steps completed by patient: Performs perineal hygiene Toileting Assistive Devices: Grab bar or rail for support Toileting: 2: Max-Patient completed 1 of 3 steps  FIM - Radio producer Devices: Elevated toilet seat, Grab bars Toilet Transfers: 4-To toilet/BSC: Min A (steadying Pt. > 75%), 4-From toilet/BSC: Min A (steadying Pt. > 75%)  FIM - Bed/Chair Transfer Bed/Chair Transfer Assistive Devices: HOB elevated Bed/Chair Transfer: 4: Sit > Supine: Min A (steadying pt. > 75%/lift 1 leg), 4: Chair or W/C > Bed: Min A (steadying Pt. > 75%), 4: Bed > Chair or  W/C: Min A (steadying Pt. > 75%)  FIM - Locomotion: Wheelchair Distance:  150 Locomotion: Wheelchair: 0: Activity did not occur FIM - Locomotion: Ambulation Locomotion: Ambulation Assistive Devices: Other (comment) (R HHA) Ambulation/Gait Assistance: 4: Min assist, 3: Mod assist Locomotion: Ambulation: 3: Travels 150 ft or more with moderate assistance (Pt: 50 - 74%)  Comprehension Comprehension Mode: Auditory Comprehension: 5-Understands basic 90% of the time/requires cueing < 10% of the time  Expression Expression Mode: Verbal Expression: 4-Expresses basic 75 - 89% of the time/requires cueing 10 - 24% of the time. Needs helper to occlude trach/needs to repeat words.  Social Interaction Social Interaction: 4-Interacts appropriately 75 - 89% of the time - Needs redirection for appropriate language or to initiate interaction.  Problem Solving Problem Solving: 3-Solves basic 50 - 74% of the time/requires cueing 25 - 49% of the time  Memory Memory: 3-Recognizes or recalls 50 - 74% of the time/requires cueing 25 - 49% of the time   Medical Problem List and Plan: 1. Functional deficits secondary to R-MCA infarct, increasing tone cont PT/OT --on plavix secondary to ASA intolerance 2.  DVT Prophylaxis/Anticoagulation: Pharmaceutical: Lovenox 3. Headaches/ Pain Management:  Will try oxycodone prn. May need to add Topamax for more consistent relief.   4. Mood: LCSW to follow for evaluation and support.   5. Neuropsych: This patient is capable of making decisions on her own behalf. 6. Skin/Wound Care: Routine pressure relief measures.   7. Fluids/Electrolytes/Nutrition: Monitor I/O especially as on dysphagia diet. Check lytes in am. 8. HTN: Monitor BP every 8 hours. Continue metoprolol and lisinopril daily. Avoid hypotension.   9. Dyslipidemia: intolerant of statins 10.  Constipation- adjust Bowel prog  LOS (Days) 6 A FACE TO FACE EVALUATION WAS PERFORMED  Dnaiel Voller E 06/14/2014, 8:14 AM

## 2014-06-14 NOTE — Progress Notes (Signed)
Patient continues to have orthostatic symptoms with minimal activity. Will discontinue lisinopril for now and KHT added to help with symptoms.

## 2014-06-14 NOTE — Plan of Care (Signed)
Problem: RH BLADDER ELIMINATION Goal: RH STG MANAGE BLADDER WITH ASSISTANCE STG Manage Bladder With mod Assistance  Outcome: Progressing

## 2014-06-15 ENCOUNTER — Inpatient Hospital Stay (HOSPITAL_COMMUNITY): Payer: Medicare Other | Admitting: Speech Pathology

## 2014-06-15 ENCOUNTER — Inpatient Hospital Stay (HOSPITAL_COMMUNITY): Payer: Medicare Other | Admitting: Physical Therapy

## 2014-06-15 ENCOUNTER — Inpatient Hospital Stay (HOSPITAL_COMMUNITY): Payer: Medicare Other | Admitting: Occupational Therapy

## 2014-06-15 DIAGNOSIS — G8110 Spastic hemiplegia affecting unspecified side: Secondary | ICD-10-CM

## 2014-06-15 LAB — CREATININE, SERUM
Creatinine, Ser: 0.76 mg/dL (ref 0.50–1.10)
GFR calc Af Amer: >90 mL/min (ref >90)
GFR calc non Af Amer: 86 mL/min — ABNORMAL LOW (ref >90)

## 2014-06-15 MED ORDER — ADULT MULTIVITAMIN LIQUID CH
5.0000 mL | Freq: Every day | ORAL | Status: DC
  Administered 2014-06-16 – 2014-06-23 (×8): 5 mL via ORAL
  Filled 2014-06-15 (×10): qty 5

## 2014-06-15 MED ORDER — POLYETHYLENE GLYCOL 3350 17 G PO PACK
17.0000 g | PACK | Freq: Once | ORAL | Status: DC
  Filled 2014-06-15: qty 1

## 2014-06-15 MED ORDER — BACLOFEN 10 MG PO TABS
10.0000 mg | ORAL_TABLET | Freq: Three times a day (TID) | ORAL | Status: DC
  Administered 2014-06-15 – 2014-06-22 (×21): 10 mg via ORAL
  Filled 2014-06-15 (×24): qty 1

## 2014-06-15 NOTE — Patient Care Conference (Signed)
Inpatient RehabilitationTeam Conference and Plan of Care Update Date: 06/15/2014   Time: 11;25 AM    Patient Name: Mary Le      Medical Record Number: [id number]  Date of Birth: Aug 14, 1945 Sex: Female         Room/Bed: 4W06C/4W06C-01 Payor Info: Payor: MEDICARE / Plan: MEDICARE PART A AND B / Product Type: *No Product type* /    Admitting Diagnosis: R CVA  Admit Date/Time:  06/08/2014  4:52 PM Admission Comments: No comment available   Primary Diagnosis:  Cerebral infarction due to occlusion of right middle cerebral artery Principal Problem: Cerebral infarction due to occlusion of right middle cerebral artery  Patient Active Problem List   Diagnosis Date Noted  . Left hemiparesis 06/10/2014  . Left-sided neglect 06/10/2014  . 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 Morbid obesity- BMI 40 06/06/2014  . Acute respiratory failure with hypoxia 06/05/2014  . Cerebral infarction due to occlusion of right middle cerebral artery 06/03/2014  . Cerebral infarct 06/03/2014  . Altered mental status 06/03/2014  . GERD (gastroesophageal reflux disease) 04/12/2014  . Benign essential HTN 02/07/2014  . Dyslipidemia- statin intol 02/07/2014  . Chest pain- Low risk Myoview July 2015 02/06/2014    Expected Discharge Date: Expected Discharge Date: 06/23/14  Team Members Present: Physician leading conference: Dr. Alysia Penna Social Worker Present: Ovidio Kin, LCSW Nurse Present: Heather Roberts, RN PT Present: Raylene Everts, PT;Atira Borello Jari Favre, PT OT Present: Benay Pillow, Maryella Shivers, OT SLP Present: Windell Moulding, SLP PPS Coordinator present : Daiva Nakayama, RN, CRRN     Current Status/Progress Goal Weekly Team Focus  Medical   CVA, left hemiparesis, left neglect, obesity  Maintain medical stability  Improve awareness to the left side   Bowel/Bladder   Pt cont. of bowel and bladder. Pt has colace 3 tabs BID  manage b/b with minimal assist  cont.  medication regiemn; pt have BM q 2 days   Swallow/Nutrition/ Hydration   upgraded to dys 3 solids and thin liquids   supervision with least restrictive diet   diet tolerance, carryover for use of swallowing precautions    ADL's   Min assist for toilet transfers and shower transfers, mod assist for toileting and LB dressing tasks.  min assist for all bathing and mod assist UB dressing.  Continues with Brunnstrum stage II in the left arm and stage I in the hand.  Increased flexor tone beginning in the elbow and pectoral.  Left inattention as well.   supervision for bathing, dressing, and toileting  selfcare retraining, scanning to the left, balance re-training, left UE neuromuscular reoeducation, pt/family education.     Mobility   min-mod A gait up to 150 ft with R HHA, mod A bed mobility, min A stairs with 2 rails, min A transfers  supervision overall  L NMR, attending to L, safety awareness, functional mobility, balance   Communication   mild dysarthria  supervision   education and carryover of compensatory strategies    Safety/Cognition/ Behavioral Observations  mod-max assist for basic tasks   min assist   scanning to the left, attention, problem solving, recall of new information   Pain   Pt complains of headache at times- oxycodone 10mg  IR PRN to manage  3 or less out of 10  assess pain q shift; medicate as needed and prior to therapy id needed   Skin   scattered bruising to abdomen and arms; no significant skin issues present  remain free of  skin breakdown with minimal assist  assess skin q shift; turn pt q3hrs HS      *See Care Plan and progress notes for long and short-term goals.  Barriers to Discharge: Still requiring physical assistance    Possible Resolutions to Barriers:  Continue rehabilitation, identify caregivers at home will need 24 7 care post discharge    Discharge Planning/Teaching Needs:  HOme with husband and daughter where she will have 24 hr care.  Daughter has  been here daily and participating in therapies with her and providing emotional support      Team Discussion:  Does well with mobility although neglect and sensation issues. Diet upgraded-Dys 3 today. Dizzy at times-wearing Teds hose now. Need cues to slow down and look to the left.  Revisions to Treatment Plan:  Diet upgraded   Continued Need for Acute Rehabilitation Level of Care: The patient requires daily medical management by a physician with specialized training in physical medicine and rehabilitation for the following conditions: Daily direction of a multidisciplinary physical rehabilitation program to ensure safe treatment while eliciting the highest outcome that is of practical value to the patient.: Yes Daily analysis of laboratory values and/or radiology reports with any subsequent need for medication adjustment of medical intervention for : Neurological problems;Other  Giada Schoppe, Gardiner Rhyme 06/15/2014, 1:30 PM

## 2014-06-15 NOTE — Progress Notes (Signed)
Social Work [name], [place] Social Worker Signed  Patient Care Conference 06/15/2014  1:23 PM    Expand All Collapse All   Inpatient RehabilitationTeam Conference and Plan of Care Update Date: 06/15/2014   Time: 10;45 AM     Patient Name: Mary Le      Medical Record Number: [id number]  Date of Birth: 08/19/1945 Sex: Female         Room/Bed: 4W06C/4W06C-01 Payor Info: Payor: MEDICARE / Plan: MEDICARE PART A AND B / Product Type: *No Product type* /    Admitting Diagnosis: R CVA   Admit Date/Time:  06/08/2014  4:52 PM Admission Comments: No comment available   Primary Diagnosis:  Cerebral infarction due to occlusion of right middle cerebral artery Principal Problem: Cerebral infarction due to occlusion of right middle cerebral artery    Patient Active Problem List     Diagnosis  Date Noted   .  Left hemiparesis  06/10/2014   .  Left-sided neglect  06/10/2014   .  Thrombotic stroke involving right middle cerebral artery  06/08/2014   .  Morbid obesity- BMI 40  06/06/2014   .  Acute respiratory failure with hypoxia  06/05/2014   .  Cerebral infarction due to occlusion of right middle cerebral artery  06/03/2014   .  Cerebral infarct  06/03/2014   .  Altered mental status  06/03/2014   .  GERD (gastroesophageal reflux disease)  04/12/2014   .  Benign essential HTN  02/07/2014   .  Dyslipidemia- statin intol  02/07/2014   .  Chest pain- Low risk Myoview July 2015  02/06/2014     Expected Discharge Date: Expected Discharge Date: 05/30/15  Team Members Present: Physician leading conference: Dr. Alysia Penna Social Worker Present: Ovidio Kin, LCSW Nurse Present: Heather Roberts, RN PT Present: Raylene Everts, PT;Caroline Lacinda Axon, PT;Blair Hobble, PT OT Present: Blanchard Mane, OT SLP Present: Windell Moulding, SLP PPS Coordinator present : Daiva Nakayama, RN, CRRN        Current Status/Progress  Goal  Weekly Team Focus   Medical     CVA, left hemiparesis, left neglect, obesity    Maintain medical stability  Improve awareness to the left side    Bowel/Bladder     Pt cont. of bowel and bladder. Pt has colace 3 tabs BID  manage b/b with minimal assist  cont. medication regiemn; pt have BM q 2 days   Swallow/Nutrition/ Hydration     upgraded to dys 3 solids and thin liquids   supervision with least restrictive diet   diet tolerance, carryover for use of swallowing precautions    ADL's     Min assist for toilet transfers and shower transfers, mod assist for toileting and LB dressing tasks.  min assist for all bathing and mod assist UB dressing.  Continues with Brunnstrum stage II in the left arm and stage I in the hand.  Increased flexor tone beginning in the elbow and pectoral.  Left inattention as well.   supervision for bathing, dressing, and toileting   selfcare retraining, scanning to the left, balance re-training, left UE neuromuscular reoeducation, pt/family education.     Mobility     min-mod A gait up to 150 ft with R HHA, mod A bed mobility, min A stairs with 2 rails, min A transfers  supervision overall  L NMR, attending to L, safety awareness, functional mobility, balance   Communication     mild dysarthria  supervision   education and  carryover of compensatory strategies    Safety/Cognition/ Behavioral Observations    mod-max assist for basic tasks   min assist   scanning to the left, attention, problem solving, recall of new information   Pain     Pt complains of headache at times- oxycodone 10mg  IR PRN to manage  3 or less out of 10  assess pain q shift; medicate as needed and prior to therapy id needed   Skin     scattered bruising to abdomen and arms; no significant skin issues present  remain free of skin breakdown with minimal assist   assess skin q shift; turn pt q3hrs HS       *See Care Plan and progress notes for long and short-term goals.    Barriers to Discharge:  Still requiring physical assistance      Possible Resolutions to Barriers:    Continue rehabilitation, identify caregivers at home will need 24 7 care post discharge     Discharge Planning/Teaching Needs:   HOme with husband and daughter where she will have 24 hr care.  Daughter has been here daily and participating in therapies with her and providing emotional support       Team Discussion:    Poor endurance-doesn't sleep well and transfused yesterday.  Does what she wants and doesn't push herself. Will ask for Neuro-psych eval. Will confirm caregiver   Revisions to Treatment Plan:    Downgraded goals to min assist level    Continued Need for Acute Rehabilitation Level of Care: The patient requires daily medical management by a physician with specialized training in physical medicine and rehabilitation for the following conditions: Daily direction of a multidisciplinary physical rehabilitation program to ensure safe treatment while eliciting the highest outcome that is of practical value to the patient.: Yes Daily analysis of laboratory values and/or radiology reports with any subsequent need for medication adjustment of medical intervention for : Neurological problems;Other  [name] 06/15/2014, 1:23 PM                  Patient ID: Mary Le, female   DOB: [birth year], 68 y.o.   MRN: [id number]

## 2014-06-15 NOTE — Progress Notes (Signed)
Occupational Therapy Session Note  Patient Details  Name: Mary Le MRN: [id number] Date of Birth: 1945-08-12  Today's Date: 06/15/2014 OT Individual Time: 0905-1005 OT Individual Time Calculation (min): 60 min    Short Term Goals: Week 1:  OT Short Term Goal 1 (Week 1): Pt will perform all bathing sit to stand with min assist in the shower. OT Short Term Goal 2 (Week 1): Pt will perform UB dressing to donn pullover shirt with min assist following hemi techniques.  OT Short Term Goal 3 (Week 1): Pt will perform LB dressing with min assist following hemi techniques. OT Short Term Goal 4 (Week 1): Pt/family will return demonstrate PROM exercises for the LUE following handout. OT Short Term Goal 5 (Week 1): Pt will locate at least 3 items placed left of midline during selfcare tasks with no more than min questioing cueing.   Skilled Therapeutic Interventions/Progress Updates:    Bathing and dressing during session.  She was able to ambulate to the shower with min hand held assist.  She needed overall min assist for showering with total assist to use the LUE to wash the right arm.  Min assist for sit to stand when washing peri anal area.  Performed dressing sit to stand at the sink.  Pt continues to need max demonstrational cueing for sequencing hemi dressing techniques.  She continues to be able to state that she needs to begin with the left side but then needs mod demonstrational cueing to finish dressing the left side before attempting the right.  Mod assist for pulling underpants and pants up on the left side.  Min instructional cueing to scan left throughout session to identify parts of clothing.    Therapy Documentation Precautions:  Precautions Precautions: Fall Precaution Comments: Lt shoulder subluxation risk, left neglect with right peripheral gaze Restrictions Weight Bearing Restrictions: No  Pain: Pain Assessment Pain Assessment: No/denies pain ADL: See FIM for current  functional status  Therapy/Group: Individual Therapy  Garwood Wentzell OTR/L 06/15/2014, 11:22 AM

## 2014-06-15 NOTE — Progress Notes (Deleted)
Social Work Patient ID: Mary Le, female   DOB: September 05, 1945, 68 y.o.   MRN: [id number] Met with pt and two sister's who were here to visit to discuss team conference goals-min level and discharge 12/8.  Both sister's reports someone will always be there with her. Discussed would need to have them go through family education prior to discharge, so concerns and questions are being addressed.  Pt is much brighter with family here and laughing and  Smiling. All in agreement with the plan and ill work toward discharge date.

## 2014-06-15 NOTE — Progress Notes (Signed)
Social Work Patient ID: Mary Le, female   DOB: [birth year], 68 y.o.   MRN: [id number] Met with pt and daughter to discuss team conference goals-supervision level and discharge 12/3.  Both feel this is a realistic goal and are pleased with the date. Daughter is here daily and participates in therapies with pt.  Will work toward discharge date.

## 2014-06-15 NOTE — Progress Notes (Signed)
Physical Therapy Session Note  Patient Details  Name: Mary Le MRN: [id number] Date of Birth: [birth year]  Today's Date: 06/15/2014 PT Individual Time: 1300-1335 PT Individual Time Calculation (min): 35 min   Short Term Goals: Week 1:  PT Short Term Goal 1 (Week 1): = LTGs of overall supervision  Skilled Therapeutic Interventions/Progress Updates:   Pt received sitting in w/c, agreeable to therapy. Pt performed multiple stand pivot and squat pivot transfers w/c <> mat table with focus on technique, attending to L, and hand placement, overall supervision. Pt performed standing NMR with patient kicking soccer ball with LLE to facilitate L foot clearance, min A progressed to supervision. Pt requesting seated rest break due to chronic back pain, unrated. Pt performed LE therex during seated rest. To simulate home environment, pt negotiated up/down 6" step x 3 without AD, verbal cues for safety and sequencing/technique. Pt requires supervision to min A due to difficulty clearing L foot or stepping up with RLE when too far away due to left neglect. Pt returned to room and left sitting in w/c with daughter present, handoff to OT.    Therapy Documentation Precautions:  Precautions Precautions: Fall Precaution Comments: Lt shoulder subluxation risk, left neglect with right peripheral gaze Restrictions Weight Bearing Restrictions: No Pain: Pain Assessment Pain Assessment: No/denies pain  See FIM for current functional status  Therapy/Group: Individual Therapy  [name] 06/15/2014, 2:29 PM

## 2014-06-15 NOTE — Progress Notes (Addendum)
68 y.o. RH female with h/o HTN, morbid obesity, dyslipidemia who was admitted on  06/03/2014 with left-sided weakness. Cranial CT scan negative and patient underwent CTA head/neck which revealed distal right M1 occlusion with question of low density right temporal, parietal lobe and insula.  she underwent endovacular revascularization of R-MCA with thrombectomy by Dr. Estanislado Pandy.  MRI of the brain shows acute infarct right MCA territory in the region of the insula, frontoparietal and high parietal region. Patient intubated thorough 11/15 due to acute respiratory failure.  MRA of the brain with no stenosis or aneurysm and return of flow in R-MCA territory with luxury perfusion.  2D echo showed EF of 65% and no wall motion abnormalities. Carotid Dopplers without significant ICA stenosis. BLE dopplers negative for DVT. TEE with normal LVF, no LAA and negative for PFO. Loop recorder was placed by Dr. [name] on 11/14. BSE done and patient started on dysphagia 2 with thin liquids. Patient with resultant L-HH, right gaze preference, left facial droop with mild dysarthria as well as right hemiparesis. Dr. Leonie Man recommended ASA for embolic infarct due to unknown etiology.  Subjective/Complaints: Ambulating with min assist to therapy gym however needs at least moderate assist for ADLs. Still with severe left neglect and left hemisensory deficits OT notes increasing tone left shoulder and elbow Review of Systems - Negative except as above , denies SOB Objective: Vital Signs: Blood pressure 100/50, pulse 60, temperature 98.2 F (36.8 C), temperature source Oral, resp. rate 18, height '5\' 7"'  (1.702 m), weight 101.334 kg (223 lb 6.4 oz), SpO2 100 %. No results found. Results for orders placed or performed during the hospital encounter of 06/08/14 (from the past 72 hour(s))  Creatinine, serum     Status: Abnormal   Collection Time: 06/15/14  6:16 AM  Result Value Ref Range   Creatinine, Ser 0.76 0.50 - 1.10 mg/dL   GFR calc non Af Amer 86 (L) >90 mL/min   GFR calc Af Amer >90 >90 mL/min    Comment: (NOTE) The eGFR has been calculated using the CKD EPI equation. This calculation has not been validated in all clinical situations. eGFR's persistently <90 mL/min signify possible Chronic Kidney Disease.      HEENT: normal Cardio: RRR and no murmur Resp: CTA B/L and unlabored GI: BS positive and NT Extremity:  Pulses positive and No Edema Skin:   Intact Neuro: Lethargic, Cranial Nerve Abnormalities Left central VII, Abnormal Sensory absent LT LUE, Abnormal Motor 0/5 LUE, 3- LLE, 5/5 on Right, Tone:  Increased Left biceps , severe L Inattention, modified Ashworth score 3 at the biceps and pectoralis Musc/Skel:  Other no pain with LUE ROM Gen NAD   Assessment/Plan: 1. Functional deficits secondary to Right MCA infarct uncertain etiology Team conference today please see physician documentation under team conference tab, met with team face-to-face to discuss problems,progress, and goals. Formulized individual treatment plan based on medical history, underlying problem and comorbidities.FIM: FIM - Bathing Bathing Steps Patient Completed: Chest, Left Arm, Abdomen, Front perineal area, Right upper leg, Left upper leg Bathing: 3: Mod-Patient completes 5-7 [id number] parts or 50-74%  FIM - Upper Body Dressing/Undressing Upper body dressing/undressing steps patient completed: Put head through opening of pull over shirt/dress Upper body dressing/undressing: 2: Max-Patient completed 25-49% of tasks FIM - Lower Body Dressing/Undressing Lower body dressing/undressing steps patient completed: Thread/unthread right underwear leg, Thread/unthread right pants leg, Thread/unthread left underwear leg, Thread/unthread left pants leg Lower body dressing/undressing: 3: Mod-Patient completed 50-74% of tasks  FIM -  Toileting Toileting steps completed by patient: Performs perineal hygiene Toileting Assistive Devices: Grab bar  or rail for support Toileting: 2: Max-Patient completed 1 of 3 steps  FIM - Radio producer Devices: Elevated toilet seat, Grab bars Toilet Transfers: 4-To toilet/BSC: Min A (steadying Pt. > 75%), 4-From toilet/BSC: Min A (steadying Pt. > 75%)  FIM - Bed/Chair Transfer Bed/Chair Transfer Assistive Devices: Arm rests (R HHA) Bed/Chair Transfer: 2: Supine > Sit: Max A (lifting assist/Pt. 25-49%), 3: Sit > Supine: Mod A (lifting assist/Pt. 50-74%/lift 2 legs), 4: Bed > Chair or W/C: Min A (steadying Pt. > 75%)  FIM - Locomotion: Wheelchair Distance: 150 Locomotion: Wheelchair: 0: Activity did not occur FIM - Locomotion: Ambulation Locomotion: Ambulation Assistive Devices: Other (comment) (R HHA) Ambulation/Gait Assistance: 4: Min assist Locomotion: Ambulation: 2: Travels 50 - 149 ft with minimal assistance (Pt.>75%)  Comprehension Comprehension Mode: Auditory Comprehension: 5-Understands basic 90% of the time/requires cueing < 10% of the time  Expression Expression Mode: Verbal Expression: 5-Expresses basic 90% of the time/requires cueing < 10% of the time.  Social Interaction Social Interaction: 5-Interacts appropriately 90% of the time - Needs monitoring or encouragement for participation or interaction.  Problem Solving Problem Solving: 4-Solves basic 75 - 89% of the time/requires cueing 10 - 24% of the time  Memory Memory: 3-Recognizes or recalls 50 - 74% of the time/requires cueing 25 - 49% of the time   Medical Problem List and Plan: 1. Functional deficits secondary to R-MCA infarct, increasing tone cont PT/OT, spasticity add baclofen, not Zanaflex secondary to low BP --on plavix secondary to ASA intolerance 2.  DVT Prophylaxis/Anticoagulation: Pharmaceutical: Lovenox 3. Headaches/ Pain Management:  Will try oxycodone prn. May need to add Topamax for more consistent relief.   4. Mood: LCSW to follow for evaluation and support.   5. Neuropsych:  This patient is capable of making decisions on her own behalf. 6. Skin/Wound Care: Routine pressure relief measures.   7. Fluids/Electrolytes/Nutrition: Monitor I/O especially as on dysphagia diet. Check lytes in am. 8. HTN: Monitor BP every 8 hours. Continue metoprolol and lisinopril daily. Avoid hypotension.   9. Dyslipidemia: intolerant of statins 10.  Constipation- adjust Bowel prog  LOS (Days) 7 A FACE TO FACE EVALUATION WAS PERFORMED  Mary Le E 06/15/2014, 9:20 AM

## 2014-06-15 NOTE — Progress Notes (Signed)
Social Work [name], LCSW Social Worker Signed  Patient Care Conference 06/15/2014  1:29 PM    Expand All Collapse All   Inpatient RehabilitationTeam Conference and Plan of Care Update Date: 06/15/2014   Time: 11;25 AM     Patient Name: Mary Le      Medical Record Number: [id number]  Date of Birth: [birth year] Sex: Female         Room/Bed: 4W06C/4W06C-01 Payor Info: Payor: MEDICARE / Plan: MEDICARE PART A AND B / Product Type: *No Product type* /    Admitting Diagnosis: R CVA   Admit Date/Time:  06/08/2014  4:52 PM Admission Comments: No comment available   Primary Diagnosis:  Cerebral infarction due to occlusion of right middle cerebral artery Principal Problem: Cerebral infarction due to occlusion of right middle cerebral artery    Patient Active Problem List     Diagnosis  Date Noted   .  Left hemiparesis  06/10/2014   .  Left-sided neglect  06/10/2014   .  Thrombotic stroke involving right middle cerebral artery  06/08/2014   .  Morbid obesity- BMI 40  06/06/2014   .  Acute respiratory failure with hypoxia  06/05/2014   .  Cerebral infarction due to occlusion of right middle cerebral artery  06/03/2014   .  Cerebral infarct  06/03/2014   .  Altered mental status  06/03/2014   .  GERD (gastroesophageal reflux disease)  04/12/2014   .  Benign essential HTN  02/07/2014   .  Dyslipidemia- statin intol  02/07/2014   .  Chest pain- Low risk Myoview July 2015  02/06/2014     Expected Discharge Date: Expected Discharge Date: 06/23/14  Team Members Present: Physician leading conference: Dr. Alysia Penna Social Worker Present: Ovidio Kin, LCSW Nurse Present: Heather Roberts, RN PT Present: Raylene Everts, PT;Libbey Duce Jari Favre, PT OT Present: Benay Pillow, Maryella Shivers, OT SLP Present: Windell Moulding, SLP PPS Coordinator present : Daiva Nakayama, RN, CRRN        Current Status/Progress  Goal  Weekly Team Focus   Medical     CVA, left hemiparesis, left neglect, obesity    Maintain medical stability  Improve awareness to the left side    Bowel/Bladder     Pt cont. of bowel and bladder. Pt has colace 3 tabs BID  manage b/b with minimal assist  cont. medication regiemn; pt have BM q 2 days   Swallow/Nutrition/ Hydration     upgraded to dys 3 solids and thin liquids   supervision with least restrictive diet   diet tolerance, carryover for use of swallowing precautions    ADL's     Min assist for toilet transfers and shower transfers, mod assist for toileting and LB dressing tasks.  min assist for all bathing and mod assist UB dressing.  Continues with Brunnstrum stage II in the left arm and stage I in the hand.  Increased flexor tone beginning in the elbow and pectoral.  Left inattention as well.   supervision for bathing, dressing, and toileting   selfcare retraining, scanning to the left, balance re-training, left UE neuromuscular reoeducation, pt/family education.     Mobility     min-mod A gait up to 150 ft with R HHA, mod A bed mobility, min A stairs with 2 rails, min A transfers  supervision overall  L NMR, attending to L, safety awareness, functional mobility, balance   Communication     mild dysarthria  supervision   education and  carryover of compensatory strategies    Safety/Cognition/ Behavioral Observations    mod-max assist for basic tasks   min assist   scanning to the left, attention, problem solving, recall of new information   Pain     Pt complains of headache at times- oxycodone 10mg  IR PRN to manage  3 or less out of 10  assess pain q shift; medicate as needed and prior to therapy id needed   Skin     scattered bruising to abdomen and arms; no significant skin issues present  remain free of skin breakdown with minimal assist   assess skin q shift; turn pt q3hrs HS       *See Care Plan and progress notes for long and short-term goals.    Barriers to Discharge:  Still requiring physical assistance      Possible Resolutions to Barriers:    Continue rehabilitation, identify caregivers at home will need 24 7 care post discharge     Discharge Planning/Teaching Needs:   HOme with husband and daughter where she will have 24 hr care.  Daughter has been here daily and participating in therapies with her and providing emotional support       Team Discussion:    Does well with mobility although neglect and sensation issues. Diet upgraded-Dys 3 today. Dizzy at times-wearing Teds hose now. Need cues to slow down and look to the left.   Revisions to Treatment Plan:    Diet upgraded    Continued Need for Acute Rehabilitation Level of Care: The patient requires daily medical management by a physician with specialized training in physical medicine and rehabilitation for the following conditions: Daily direction of a multidisciplinary physical rehabilitation program to ensure safe treatment while eliciting the highest outcome that is of practical value to the patient.: Yes Daily analysis of laboratory values and/or radiology reports with any subsequent need for medication adjustment of medical intervention for : Neurological problems;Other  Nicolae Vasek, Gardiner Rhyme 06/15/2014, 1:30 PM                 [name], LCSW Social Worker Signed  Patient Care Conference 06/15/2014  1:23 PM    Expand All Collapse All   Inpatient RehabilitationTeam Conference and Plan of Care Update Date: 06/15/2014   Time: 10;45 AM     Patient Name: Mary Le      Medical Record Number: [id number]  Date of Birth: May 11, 1946 Sex: Female         Room/Bed: 4W06C/4W06C-01 Payor Info: Payor: MEDICARE / Plan: MEDICARE PART A AND B / Product Type: *No Product type* /    Admitting Diagnosis: R CVA   Admit Date/Time:  06/08/2014  4:52 PM Admission Comments: No comment available   Primary Diagnosis:  Cerebral infarction due to occlusion of right middle cerebral artery Principal Problem: Cerebral infarction due to occlusion of right middle cerebral artery     Patient Active Problem List     Diagnosis  Date Noted   .  Left hemiparesis  06/10/2014   .  Left-sided neglect  06/10/2014   .  Thrombotic stroke involving right middle cerebral artery  06/08/2014   .  Morbid obesity- BMI 40  06/06/2014   .  Acute respiratory failure with hypoxia  06/05/2014   .  Cerebral infarction due to occlusion of right middle cerebral artery  06/03/2014   .  Cerebral infarct  06/03/2014   .  Altered mental status  06/03/2014   .  GERD (gastroesophageal  reflux disease)  04/12/2014   .  Benign essential HTN  02/07/2014   .  Dyslipidemia- statin intol  02/07/2014   .  Chest pain- Low risk Myoview July 2015  02/06/2014     Expected Discharge Date: Expected Discharge Date: 05/30/15  Team Members Present: Physician leading conference: Dr. Alysia Penna Social Worker Present: Ovidio Kin, LCSW Nurse Present: Heather Roberts, RN PT Present: Raylene Everts, PT;Caroline Lacinda Axon, PT;Blair Hobble, PT OT Present: Blanchard Mane, OT SLP Present: Windell Moulding, SLP PPS Coordinator present : Daiva Nakayama, RN, CRRN        Current Status/Progress  Goal  Weekly Team Focus   Medical     CVA, left hemiparesis, left neglect, obesity   Maintain medical stability  Improve awareness to the left side    Bowel/Bladder     Pt cont. of bowel and bladder. Pt has colace 3 tabs BID  manage b/b with minimal assist  cont. medication regiemn; pt have BM q 2 days   Swallow/Nutrition/ Hydration     upgraded to dys 3 solids and thin liquids   supervision with least restrictive diet   diet tolerance, carryover for use of swallowing precautions    ADL's     Min assist for toilet transfers and shower transfers, mod assist for toileting and LB dressing tasks.  min assist for all bathing and mod assist UB dressing.  Continues with Brunnstrum stage II in the left arm and stage I in the hand.  Increased flexor tone beginning in the elbow and pectoral.  Left inattention as well.   supervision for bathing,  dressing, and toileting   selfcare retraining, scanning to the left, balance re-training, left UE neuromuscular reoeducation, pt/family education.     Mobility     min-mod A gait up to 150 ft with R HHA, mod A bed mobility, min A stairs with 2 rails, min A transfers  supervision overall  L NMR, attending to L, safety awareness, functional mobility, balance   Communication     mild dysarthria  supervision   education and carryover of compensatory strategies    Safety/Cognition/ Behavioral Observations    mod-max assist for basic tasks   min assist   scanning to the left, attention, problem solving, recall of new information   Pain     Pt complains of headache at times- oxycodone 10mg  IR PRN to manage  3 or less out of 10  assess pain q shift; medicate as needed and prior to therapy id needed   Skin     scattered bruising to abdomen and arms; no significant skin issues present  remain free of skin breakdown with minimal assist   assess skin q shift; turn pt q3hrs HS       *See Care Plan and progress notes for long and short-term goals.    Barriers to Discharge:  Still requiring physical assistance      Possible Resolutions to Barriers:   Continue rehabilitation, identify caregivers at home will need 24 7 care post discharge     Discharge Planning/Teaching Needs:   HOme with husband and daughter where she will have 24 hr care.  Daughter has been here daily and participating in therapies with her and providing emotional support       Team Discussion:    Poor endurance-doesn't sleep well and transfused yesterday.  Does what she wants and doesn't push herself. Will ask for Neuro-psych eval. Will confirm caregiver   Revisions to Treatment Plan:  Downgraded goals to min assist level    Continued Need for Acute Rehabilitation Level of Care: The patient requires daily medical management by a physician with specialized training in physical medicine and rehabilitation for the following  conditions: Daily direction of a multidisciplinary physical rehabilitation program to ensure safe treatment while eliciting the highest outcome that is of practical value to the patient.: Yes Daily analysis of laboratory values and/or radiology reports with any subsequent need for medication adjustment of medical intervention for : Neurological problems;Other  [name] 06/15/2014, 1:23 PM                  Patient ID: Mary Le, female   DOB: [birth year], 68 y.o.   MRN: [id number]

## 2014-06-15 NOTE — Progress Notes (Signed)
Physical Therapy Session Note  Patient Details  Name: Mary Le MRN: [id number] Date of Birth: [birth year]  Today's Date: 06/15/2014 PT Individual Time: 0865-7846 PT Individual Time Calculation (min): 40 min   Short Term Goals: Week 1:  PT Short Term Goal 1 (Week 1): = LTGs of overall supervision  Skilled Therapeutic Interventions/Progress Updates:   Pt received sitting in w/c, agreeable to therapy with no c/o headache. Pt stood x 60 sec without UE support and supervision to don L givemohr sling. Pt c/o feeling wobbly, required seated rest break. Gait training x 150 ft and x 50 ft with R HHA and min A, verbal cues for L heel strike for improved foot clearance. With fatigue, pt requires cues to stop and "reset" x 2 due to LOB to left as pt continued to ambulate with decreased control of movement. Pt negotiated up/down 5 stairs x 2 with 2 rails and overall supervision-min A with cues for sequencing step-to pattern. For NMR to facilitate increased weight shift to R and LLE clearance, pt stood with RUE supported on raised mat and kicked soccer ball with LLE. Pt with c/o chronic back pain (unrated) and requested seated rest break. Pt reports need for bathroom, ambulated with R min HHA x 30 ft and performed toilet transfer with R grab bar to standard commode with supervision. Pt requires mod A for clothing management and able to perform hygiene with supervision. Pt ambulated to sink to wash hands with supervision. Pt returned to room and left sitting in w/c with all needs within reach.   Therapy Documentation Precautions:  Precautions Precautions: Fall Precaution Comments: Lt shoulder subluxation risk, left neglect with right peripheral gaze Restrictions Weight Bearing Restrictions: No Pain: Pain Assessment Pain Assessment: No/denies pain  See FIM for current functional status  Therapy/Group: Individual Therapy  [name] 06/15/2014, 11:26 AM

## 2014-06-15 NOTE — Progress Notes (Signed)
Speech Language Pathology Weekly Progress Note  Patient Details  Name: Mary Le MRN: [id number] Date of Birth: 08-17-1945  Beginning of progress report period: June 09, 2014 End of progress report period: June 15, 2014  Today's Date: 06/15/2014  Short Term Goals: Week 1: SLP Short Term Goal 1 (Week 1): Pt will consume trials of Dys. 3 textures with Min A multimodal cues for buccal clearance over 3 consecutive sessions. SLP Short Term Goal 1 - Progress (Week 1): Met SLP Short Term Goal 2 (Week 1): Pt will sustain attention to basic functional tasks for 15 minutes with Mod A multimodal cues. SLP Short Term Goal 2 - Progress (Week 1): Met SLP Short Term Goal 3 (Week 1): Pt will attend to left field of environment during functional tasks with Mod A multimodal cues.  SLP Short Term Goal 3 - Progress (Week 1): Met SLP Short Term Goal 4 (Week 1): Pt will demonstrate emergent awareness during functional tasks with Mod A multimodal cues.  SLP Short Term Goal 4 - Progress (Week 1): Progressing toward goal SLP Short Term Goal 5 (Week 1): Pt will utilize external memory aids to recall new, daily information with Mod A multimodal cues. SLP Short Term Goal 5 - Progress (Week 1): Progressing toward goal SLP Short Term Goal 6 (Week 1): Pt will increase overall intelligibility at the sentence level with Min A multimodal cues for utilization of increased vocal intensity and over articulation.  SLP Short Term Goal 6 - Progress (Week 1): Met    New Short Term Goals: Week 2: SLP Short Term Goal 1 (Week 2): Pt will consume trials of regular textures with Min A multimodal cues for buccal clearance. SLP Short Term Goal 2 (Week 2): Pt will sustain attention to basic functional tasks for 15 minutes with Min A multimodal cues. SLP Short Term Goal 3 (Week 2): Pt will attend to left field of environment during functional tasks with Min A multimodal cues.  SLP Short Term Goal 4 (Week 2): Pt will demonstrate  emergent awareness during functional tasks with Mod A multimodal cues.  SLP Short Term Goal 5 (Week 2): Pt will utilize external memory aids to recall new, daily information with Mod A multimodal cues. SLP Short Term Goal 6 (Week 2): Pt will increase overall intelligibility to 90% at the sentence level with supervision cues for utilization of increased vocal intensity and over articulation.   Weekly Progress Updates:  Pt made functional gains this reporting period and has met 4 out of 6 short term goals due to improved diet tolerance, sustained attention, visual scanning to the left, and speech intelligibility.  Pt currently requires overall mod-max assist for cognitive tasks and utilizes her recommended swallowing precautions with min assist-supervision to minimize overt s/s of aspiration with presentations of her currently prescribed diet.  Pt was upgraded to dys 3 solids with continued thin liquids due to improved functional endurance during meals.  Pt would continue to benefit from skilled ST while inpatient in order to maximize functional independence and reduce burden of care upon discharge.  Anticipate that pt will need 24/7 supervision at discharge as well as assistance for medication and financial management.  Also anticipate that pt will benefit from home health speech follow up for cognitive remediation/compensation.    Intensity: Minumum of 1-2 x/day, 30 to 90 minutes Frequency: 5 out of 7 days Duration/Length of Stay: 10-14 days Treatment/Interventions: Cognitive remediation/compensation;Cueing hierarchy;Functional tasks;Environmental controls;Speech/Language facilitation;Patient/family education;Dysphagia/aspiration precaution training;Internal/external aids;Oral motor exercises;Therapeutic Activities  Mary Le, M.A. CCC-SLP  Mary Le, Selinda Orion 06/15/2014, 11:21 AM

## 2014-06-15 NOTE — Patient Care Conference (Deleted)
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 Left hemiparesis 06/10/2014  . Left-sided neglect 06/10/2014  . 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 Morbid obesity- BMI 40 06/06/2014  . Acute respiratory failure with hypoxia 06/05/2014  . Cerebral infarction due to occlusion of right middle cerebral artery 06/03/2014  . Cerebral infarct 06/03/2014  . Altered mental status 06/03/2014  . GERD (gastroesophageal reflux disease) 04/12/2014  . Benign essential HTN 02/07/2014  . Dyslipidemia- statin intol 02/07/2014  . Chest pain- Low risk Myoview July 2015 02/06/2014    Expected Discharge Date: Expected Discharge Date: 05/30/15  Team Members Present: Physician leading conference: Dr. Alysia Penna Social Worker Present: Ovidio Kin, LCSW Nurse Present: Heather Roberts, RN PT Present: Raylene Everts, PT;Caroline Lacinda Axon, PT;Blair Hobble, PT OT Present: Blanchard Mane, OT SLP Present: Windell Moulding, SLP PPS Coordinator present : Daiva Nakayama, RN, CRRN     Current Status/Progress Goal Weekly Team Focus  Medical   CVA, left hemiparesis, left neglect, obesity  Maintain medical stability  Improve awareness to the left side   Bowel/Bladder   Pt cont. of bowel and bladder. Pt has colace 3 tabs BID  manage b/b with minimal assist  cont.  medication regiemn; pt have BM q 2 days   Swallow/Nutrition/ Hydration   upgraded to dys 3 solids and thin liquids   supervision with least restrictive diet   diet tolerance, carryover for use of swallowing precautions    ADL's   Min assist for toilet transfers and shower transfers, mod assist for toileting and LB dressing tasks.  min assist for all bathing and mod assist UB dressing.  Continues with Brunnstrum stage II in the left arm and stage I in the hand.  Increased flexor tone beginning in the elbow and pectoral.  Left inattention as well.   supervision for bathing, dressing, and toileting  selfcare retraining, scanning to the left, balance re-training, left UE neuromuscular reoeducation, pt/family education.     Mobility   min-mod A gait up to 150 ft with R HHA, mod A bed mobility, min A stairs with 2 rails, min A transfers  supervision overall  L NMR, attending to L, safety awareness, functional mobility, balance   Communication   mild dysarthria  supervision   education and carryover of compensatory strategies    Safety/Cognition/ Behavioral Observations  mod-max assist for basic tasks   min assist   scanning to the left, attention, problem solving, recall of new information   Pain   Pt complains of headache at times- oxycodone 10mg  IR PRN to manage  3 or less out of 10  assess pain q shift; medicate as needed and prior to therapy id needed   Skin   scattered bruising to abdomen and arms; no significant skin issues present  remain free of  skin breakdown with minimal assist  assess skin q shift; turn pt q3hrs HS      *See Care Plan and progress notes for long and short-term goals.  Barriers to Discharge: Still requiring physical assistance    Possible Resolutions to Barriers:  Continue rehabilitation, identify caregivers at home will need 24 7 care post discharge    Discharge Planning/Teaching Needs:  HOme with husband and daughter where she will have 24 hr care.  Daughter has  been here daily and participating in therapies with her and providing emotional support      Team Discussion:  Poor endurance-doesn't sleep well and transfused yesterday.  Does what she wants and doesn't push herself. Will ask for Neuro-psych eval. Will confirm caregiver  Revisions to Treatment Plan:  Downgraded goals to min assist level   Continued Need for Acute Rehabilitation Level of Care: The patient requires daily medical management by a physician with specialized training in physical medicine and rehabilitation for the following conditions: Daily direction of a multidisciplinary physical rehabilitation program to ensure safe treatment while eliciting the highest outcome that is of practical value to the patient.: Yes Daily analysis of laboratory values and/or radiology reports with any subsequent need for medication adjustment of medical intervention for : Neurological problems;Other  Jocie Meroney, Gardiner Rhyme 06/15/2014, 1:23 PM

## 2014-06-15 NOTE — Progress Notes (Signed)
Occupational Therapy Session Note  Patient Details  Name: Mary Le MRN: [id number] Date of Birth: 03/13/1946  Today's Date: 06/15/2014 OT Individual Time: 0175-1025 OT Individual Time Calculation (min): 30 min    Skilled Therapeutic Interventions/Progress Updates:    Pt worked on LUE neuromuscular re-education sitting EOM.  Began with LUE in weightbearing on the mat with therapist providing max facilitation and use of dycem to keep the hand from sliding.  Pt worked on partial lean to the left side while attempting to facilitate LUE active elbow extension to return to midline.  She needed max assist to complete this.  Minimal activity noted with this weightbearing task.  Progressed to working on active movement with the LUE placed on a therapy ball and pt focusing on small movements of shoulder internal and external rotation.  Pt with increased movements of internal rotation on the ball if anterior pelvic tilt and trunk extension were maintained.  Finished session with transfer back to the wheelchair and return to the room.   Therapy Documentation Precautions:  Precautions Precautions: Fall Precaution Comments: Lt shoulder subluxation risk, left neglect with right peripheral gaze Restrictions Weight Bearing Restrictions: No  Pain: Pain Assessment Pain Assessment: No/denies pain ADL: See FIM for current functional status  Therapy/Group: Individual Therapy  Xerxes Agrusa OTR/L 06/15/2014, 3:40 PM

## 2014-06-15 NOTE — Progress Notes (Signed)
Speech Language Pathology Daily Session Note  Patient Details  Name: Mary Le MRN: [id number] Date of Birth: Jun 30, 1946  Today's Date: 06/15/2014 SLP Individual Time: 0800-0830 SLP Individual Time Calculation (min): 30 min  Short Term Goals: Week 1: SLP Short Term Goal 1 (Week 1): Pt will consume trials of Dys. 3 textures with Min A multimodal cues for buccal clearance over 3 consecutive sessions. SLP Short Term Goal 2 (Week 1): Pt will sustain attention to basic functional tasks for 15 minutes with Mod A multimodal cues. SLP Short Term Goal 3 (Week 1): Pt will attend to left field of environment during functional tasks with Mod A multimodal cues.  SLP Short Term Goal 4 (Week 1): Pt will demonstrate emergent awareness during functional tasks with Mod A multimodal cues.  SLP Short Term Goal 5 (Week 1): Pt will utilize external memory aids to recall new, daily information with Mod A multimodal cues. SLP Short Term Goal 6 (Week 1): Pt will increase overall intelligibility at the sentence level with Min A multimodal cues for utilization of increased vocal intensity and over articulation.   Skilled Therapeutic Interventions:  Pt was seen for skilled ST targeting dysphagia and ongoing skilled education.  Upon arrival, pt was seated upright in bed, awake, alert, and pleasantly interactive.  SLP completed skilled observations with presentations of pt's currently prescribed diet with pt exhibiting no overt s/s of aspiration and adequate clearance of oral residue post swallow.  Pt presented with questions about diet advancement and requested bread with meals; therefore, SLP facilitated the session with trials of regular solid consistencies.  Pt demonstrated prolonged mastication of upgraded solids which she managed with a liquid wash with supervision cues.  Recommend a diet upgrade to dys 3 solids with continued thin liquids.  SLP provided skilled education related to recommended textures but will continue  to reinforce education while inpatient in order to facilitate carryover between sessions and at home.  RN made aware of diet upgrade.  Pt is making good progress towards goals.  Continue per current plan of care.      FIM:  Comprehension Comprehension Mode: Auditory Comprehension: 5-Understands basic 90% of the time/requires cueing < 10% of the time Expression Expression Mode: Verbal Expression: 5-Expresses basic 90% of the time/requires cueing < 10% of the time. Social Interaction Social Interaction: 5-Interacts appropriately 90% of the time - Needs monitoring or encouragement for participation or interaction. Problem Solving Problem Solving: 4-Solves basic 75 - 89% of the time/requires cueing 10 - 24% of the time Memory Memory: 3-Recognizes or recalls 50 - 74% of the time/requires cueing 25 - 49% of the time FIM - Eating Eating Activity: 5: Needs verbal cues/supervision  Pain Pain Assessment Pain Assessment: No/denies pain  Therapy/Group: Individual Therapy   Windell Moulding, M.A. CCC-SLP   Paolo Okane, Selinda Orion 06/15/2014, 9:26 AM

## 2014-06-16 NOTE — Progress Notes (Signed)
68 y.o. RH female with h/o HTN, morbid obesity, dyslipidemia who was admitted on  06/03/2014 with left-sided weakness. Cranial CT scan negative and patient underwent CTA head/neck which revealed distal right M1 occlusion with question of low density right temporal, parietal lobe and insula.  she underwent endovacular revascularization of R-MCA with thrombectomy by Dr. Estanislado Pandy.  MRI of the brain shows acute infarct right MCA territory in the region of the insula, frontoparietal and high parietal region. Patient intubated thorough 11/15 due to acute respiratory failure.  MRA of the brain with no stenosis or aneurysm and return of flow in R-MCA territory with luxury perfusion.  2D echo showed EF of 65% and no wall motion abnormalities. Carotid Dopplers without significant ICA stenosis. BLE dopplers negative for DVT. TEE with normal LVF, no LAA and negative for PFO. Loop recorder was placed by Dr. [name] on 11/14. BSE done and patient started on dysphagia 2 with thin liquids. Patient with resultant L-HH, right gaze preference, left facial droop with mild dysarthria as well as right hemiparesis. Dr. Leonie Man recommended ASA for embolic infarct due to unknown etiology.  Subjective/Complaints: No new complaints. Had a good night. Denies pain, h/a, left arm tighter Review of Systems - Negative except as above , denies SOB Objective: Vital Signs: Blood pressure 122/62, pulse 57, temperature 97.9 F (36.6 C), temperature source Oral, resp. rate 17, height _0  (1.702 m), weight 101.334 kg (223 lb 6.4 oz), SpO2 99 %. No results found. Results for orders placed or performed during the hospital encounter of 06/08/14 (from the past 72 hour(s))  Creatinine, serum     Status: Abnormal   Collection Time: 06/15/14  6:16 AM  Result Value Ref Range   Creatinine, Ser 0.76 0.50 - 1.10 mg/dL   GFR calc non Af Amer 86 (L) >90 mL/min   GFR calc Af Amer >90 >90 mL/min    Comment: (NOTE) The eGFR has been calculated using  the CKD EPI equation. This calculation has not been validated in all clinical situations. eGFR's persistently <90 mL/min signify possible Chronic Kidney Disease.      HEENT: normal Cardio: RRR and no murmur Resp: CTA B/L and unlabored GI: BS positive and NT Extremity:  Pulses positive and No Edema Skin:   Intact Neuro: Lethargic, Cranial Nerve Abnormalities Left central VII, Abnormal Sensory absent LT LUE, Abnormal Motor 0/5 LUE, 3- LLE, 5/5 on Right, Tone:  Increased Left biceps , severe L Inattention, modified Ashworth score 3 at the biceps and pectoralis Musc/Skel:  Other no pain with LUE ROM Gen NAD   Assessment/Plan: 1. Functional deficits secondary to Right MCA infarct uncertain etiology Team conference today please see physician documentation under team conference tab, met with team face-to-face to discuss problems,progress, and goals. Formulized individual treatment plan based on medical history, underlying problem and comorbidities.FIM: FIM - Bathing Bathing Steps Patient Completed: Chest, Left Arm, Abdomen, Front perineal area, Right upper leg, Left upper leg, Buttocks, Left lower leg (including foot), Right lower leg (including foot) Bathing: 4: Steadying assist  FIM - Upper Body Dressing/Undressing Upper body dressing/undressing steps patient completed: Put head through opening of pull over shirt/dress, Thread/unthread right sleeve of pullover shirt/dresss Upper body dressing/undressing: 3: Mod-Patient completed 50-74% of tasks FIM - Lower Body Dressing/Undressing Lower body dressing/undressing steps patient completed: Thread/unthread right underwear leg, Thread/unthread right pants leg, Thread/unthread left underwear leg, Thread/unthread left pants leg Lower body dressing/undressing: 3: Mod-Patient completed 50-74% of tasks  FIM - Toileting Toileting steps completed by patient: Performs  perineal hygiene Toileting Assistive Devices: Grab bar or rail for  support Toileting: 2: Max-Patient completed 1 of 3 steps  FIM - Radio producer Devices: Elevated toilet seat, Grab bars Toilet Transfers: 4-To toilet/BSC: Min A (steadying Pt. > 75%), 4-From toilet/BSC: Min A (steadying Pt. > 75%)  FIM - Bed/Chair Transfer Bed/Chair Transfer Assistive Devices: Arm rests (R HHA) Bed/Chair Transfer: 4: Chair or W/C > Bed: Min A (steadying Pt. > 75%), 4: Bed > Chair or W/C: Min A (steadying Pt. > 75%)  FIM - Locomotion: Wheelchair Distance: 150 Locomotion: Wheelchair: 1: Total Assistance/staff pushes wheelchair (Pt<25%) FIM - Locomotion: Ambulation Locomotion: Ambulation Assistive Devices: Other (comment) (R HHA) Ambulation/Gait Assistance: 4: Min assist Locomotion: Ambulation: 4: Travels 150 ft or more with minimal assistance (Pt.>75%)  Comprehension Comprehension Mode: Auditory Comprehension: 5-Understands complex 90% of the time/Cues < 10% of the time  Expression Expression Mode: Verbal Expression: 5-Expresses complex 90% of the time/cues < 10% of the time  Social Interaction Social Interaction: 5-Interacts appropriately 90% of the time - Needs monitoring or encouragement for participation or interaction.  Problem Solving Problem Solving: 5-Solves complex 90% of the time/cues < 10% of the time  Memory Memory: 4-Recognizes or recalls 75 - 89% of the time/requires cueing 10 - 24% of the time   Medical Problem List and Plan: 1. Functional deficits secondary to R-MCA infarct, increasing tone cont PT/OT, spasticity add baclofen, not Zanaflex secondary to low BP --on plavix secondary to ASA intolerance 2.  DVT Prophylaxis/Anticoagulation: Pharmaceutical: Lovenox 3. Headaches/ Pain Management:    oxycodone prn. Reports improvement  4. Mood: LCSW to follow for evaluation and support.   5. Neuropsych: This patient is capable of making decisions on her own behalf. 6. Skin/Wound Care: Routine pressure relief measures.    7. Fluids/Electrolytes/Nutrition: Monitor I/O especially as on dysphagia diet. Check lytes in am. 8. HTN: Monitor BP every 8 hours. Continue metoprolol and lisinopril daily. Avoid hypotension.   9. Dyslipidemia: intolerant of statins 10.  Constipation- adjust Bowel prog  LOS (Days) 8 A FACE TO FACE EVALUATION WAS PERFORMED  Mary Le 06/16/2014, 7:50 AM

## 2014-06-16 NOTE — Plan of Care (Signed)
Problem: RH BLADDER ELIMINATION Goal: RH STG MANAGE BLADDER WITH ASSISTANCE STG Manage Bladder With mod Assistance  Outcome: Progressing Goal: RH STG MANAGE BLADDER WITH MEDICATION WITH ASSISTANCE STG Manage Bladder With Medication With mod Assistance.  Outcome: Progressing  Problem: RH SAFETY Goal: RH STG ADHERE TO SAFETY PRECAUTIONS W/ASSISTANCE/DEVICE STG Adhere to Safety Precautions With mod Assistance/Device.  Outcome: Progressing Goal: RH STG DECREASED RISK OF FALL WITH ASSISTANCE STG Decreased Risk of Fall With mod Assistance.  Outcome: Progressing  Problem: RH Vision Goal: RH LTG Vision (Specify) Outcome: Progressing  Problem: RH BOWEL ELIMINATION Goal: RH STG MANAGE BOWEL W/MEDICATION W/ASSISTANCE STG Manage Bowel with Medication with min Assistance.  Outcome: Progressing

## 2014-06-17 ENCOUNTER — Inpatient Hospital Stay (HOSPITAL_COMMUNITY): Payer: Medicare Other | Admitting: Physical Therapy

## 2014-06-17 ENCOUNTER — Inpatient Hospital Stay (HOSPITAL_COMMUNITY): Payer: Medicare Other | Admitting: Speech Pathology

## 2014-06-17 ENCOUNTER — Encounter (HOSPITAL_COMMUNITY): Payer: Medicare Other | Admitting: Occupational Therapy

## 2014-06-17 NOTE — Plan of Care (Signed)
Problem: RH SAFETY Goal: RH STG ADHERE TO SAFETY PRECAUTIONS W/ASSISTANCE/DEVICE STG Adhere to Safety Precautions With mod. Assistance/Device.  Outcome: Progressing     

## 2014-06-17 NOTE — Plan of Care (Signed)
Problem: RH BLADDER ELIMINATION Goal: RH STG MANAGE BLADDER WITH MEDICATION WITH ASSISTANCE STG Manage Bladder With Medication With mod Assistance.  Outcome: Completed/Met Date Met:  06/17/14     

## 2014-06-17 NOTE — Progress Notes (Signed)
Speech Language Pathology Daily Session Note  Patient Details  Name: Mary Le MRN: [id number] Date of Birth: 10/16/1945  Today's Date: 06/17/2014 SLP Individual Time: 0901-1001 SLP Individual Time Calculation (min): 60 min  Short Term Goals: Week 2: SLP Short Term Goal 1 (Week 2): Pt will consume trials of regular textures with Min A multimodal cues for buccal clearance. SLP Short Term Goal 2 (Week 2): Pt will sustain attention to basic functional tasks for 15 minutes with Min A multimodal cues. SLP Short Term Goal 3 (Week 2): Pt will attend to left field of environment during functional tasks with Min A multimodal cues.  SLP Short Term Goal 4 (Week 2): Pt will demonstrate emergent awareness during functional tasks with Mod A multimodal cues.  SLP Short Term Goal 5 (Week 2): Pt will utilize external memory aids to recall new, daily information with Mod A multimodal cues. SLP Short Term Goal 6 (Week 2): Pt will increase overall intelligibility to 90% at the sentence level with supervision cues for utilization of increased vocal intensity and over articulation.   Skilled Therapeutic Interventions:  Pt was seen for skilled ST targeting cognitive goals.  Upon arrival, pt was partially reclined in bed, awake, alert, and agreeable to participate in [place].  SLP transferred pt to wheelchair with min cues for safety to maximize alertness and attention during therapeutic activities.  SLP facilitated the session with a semi-complex familiar activity (Scrabble) to target functional problem solving and visual scanning to the left.  Pt required overall min assist for mental flexibility and organization to problem solve through activity and min-mod cues to attend to the left of the board during the abovementioned task.  Furthermore, pt exhibited improving awareness into how her cognitive deficits impact her independence during functional tasks.  Pt continues to demonstrate good progress towards meeting her goals.   Continue per current plan of care.     FIM:  Comprehension Comprehension Mode: Auditory Comprehension: 5-Follows basic conversation/direction: With extra time/assistive device Expression Expression Mode: Verbal Expression: 5-Expresses basic needs/ideas: With extra time/assistive device Social Interaction Social Interaction: 5-Interacts appropriately 90% of the time - Needs monitoring or encouragement for participation or interaction. Problem Solving Problem Solving: 4-Solves basic 75 - 89% of the time/requires cueing 10 - 24% of the time Memory Memory: 4-Recognizes or recalls 75 - 89% of the time/requires cueing 10 - 24% of the time  Pain Pain Assessment Pain Assessment: No/denies pain Pain Score: 0-No pain  Therapy/Group: Individual Therapy   Windell Moulding, M.A. CCC-SLP  Kelley Polinsky, Selinda Orion 06/17/2014, 12:54 PM

## 2014-06-17 NOTE — Progress Notes (Signed)
68 y.o. RH female with h/o HTN, morbid obesity, dyslipidemia who was admitted on  06/03/2014 with left-sided weakness. Cranial CT scan negative and patient underwent CTA head/neck which revealed distal right M1 occlusion with question of low density right temporal, parietal lobe and insula.  she underwent endovacular revascularization of R-MCA with thrombectomy by Dr. Estanislado Pandy.  MRI of the brain shows acute infarct right MCA territory in the region of the insula, frontoparietal and high parietal region. Patient intubated thorough 11/15 due to acute respiratory failure.  MRA of the brain with no stenosis or aneurysm and return of flow in R-MCA territory with luxury perfusion.  2D echo showed EF of 65% and no wall motion abnormalities. Carotid Dopplers without significant ICA stenosis. BLE dopplers negative for DVT. TEE with normal LVF, no LAA and negative for PFO. Loop recorder was placed by Dr. [name] on 11/14. BSE done and patient started on dysphagia 2 with thin liquids. Patient with resultant L-HH, right gaze preference, left facial droop with mild dysarthria as well as right hemiparesis. Dr. Leonie Man recommended ASA for embolic infarct due to unknown etiology.  Subjective/Complaints: Awake and alert but with obvious R gaze pref . Review of Systems - Negative except as above , denies SOB Objective: Vital Signs: Blood pressure 122/66, pulse 64, temperature 98 F (36.7 C), temperature source Oral, resp. rate 16, height '5\' 7"'  (1.702 m), weight 101.334 kg (223 lb 6.4 oz), SpO2 99 %. No results found. Results for orders placed or performed during the hospital encounter of 06/08/14 (from the past 72 hour(s))  Creatinine, serum     Status: Abnormal   Collection Time: 06/15/14  6:16 AM  Result Value Ref Range   Creatinine, Ser 0.76 0.50 - 1.10 mg/dL   GFR calc non Af Amer 86 (L) >90 mL/min   GFR calc Af Amer >90 >90 mL/min    Comment: (NOTE) The eGFR has been calculated using the CKD EPI equation. This  calculation has not been validated in all clinical situations. eGFR's persistently <90 mL/min signify possible Chronic Kidney Disease.      HEENT: normal Cardio: RRR and no murmur Resp: CTA B/L and unlabored GI: BS positive and NT Extremity:  Pulses positive and No Edema Skin:   Intact Neuro: Lethargic, Cranial Nerve Abnormalities Left central VII, Abnormal Sensory absent LT LUE, Abnormal Motor 0/5 LUE, 3- LLE, 5/5 on Right, Tone:  Increased Left biceps , severe L Inattention, modified Ashworth score 3 at the biceps and pectoralis Musc/Skel:  Other no pain with LUE ROM Gen NAD   Assessment/Plan: 1. Functional deficits secondary to Right MCA infarct uncertain etiology FIM: FIM - Bathing Bathing Steps Patient Completed: Chest, Left Arm, Abdomen, Front perineal area, Right upper leg, Left upper leg, Buttocks, Left lower leg (including foot), Right lower leg (including foot) Bathing: 4: Steadying assist  FIM - Upper Body Dressing/Undressing Upper body dressing/undressing steps patient completed: Put head through opening of pull over shirt/dress, Thread/unthread right sleeve of pullover shirt/dresss Upper body dressing/undressing: 3: Mod-Patient completed 50-74% of tasks FIM - Lower Body Dressing/Undressing Lower body dressing/undressing steps patient completed: Thread/unthread right underwear leg, Thread/unthread right pants leg, Thread/unthread left underwear leg, Thread/unthread left pants leg Lower body dressing/undressing: 3: Mod-Patient completed 50-74% of tasks  FIM - Toileting Toileting steps completed by patient: Performs perineal hygiene Toileting Assistive Devices: Grab bar or rail for support Toileting: 2: Max-Patient completed 1 of 3 steps  FIM - Radio producer Devices: Elevated toilet seat, Grab bars Toilet  Transfers: 4-To toilet/BSC: Min A (steadying Pt. > 75%)  FIM - Bed/Chair Transfer Bed/Chair Transfer Assistive Devices: Arm rests (R  HHA) Bed/Chair Transfer: 4: Chair or W/C > Bed: Min A (steadying Pt. > 75%), 4: Bed > Chair or W/C: Min A (steadying Pt. > 75%)  FIM - Locomotion: Wheelchair Distance: 150 Locomotion: Wheelchair: 1: Total Assistance/staff pushes wheelchair (Pt<25%) FIM - Locomotion: Ambulation Locomotion: Ambulation Assistive Devices: Other (comment) (R HHA) Ambulation/Gait Assistance: 4: Min assist Locomotion: Ambulation: 4: Travels 150 ft or more with minimal assistance (Pt.>75%)  Comprehension Comprehension Mode: Auditory Comprehension: 5-Understands complex 90% of the time/Cues < 10% of the time  Expression Expression Mode: Verbal Expression: 5-Expresses complex 90% of the time/cues < 10% of the time  Social Interaction Social Interaction: 5-Interacts appropriately 90% of the time - Needs monitoring or encouragement for participation or interaction.  Problem Solving Problem Solving: 5-Solves basic 90% of the time/requires cueing < 10% of the time  Memory Memory: 4-Recognizes or recalls 75 - 89% of the time/requires cueing 10 - 24% of the time   Medical Problem List and Plan: 1. Functional deficits secondary to R-MCA infarct, increasing tone cont PT/OT, spasticity add baclofen, not Zanaflex secondary to low BP --on plavix secondary to ASA intolerance 2.  DVT Prophylaxis/Anticoagulation: Pharmaceutical: Lovenox 3. Headaches/ Pain Management:    oxycodone prn. Reports improvement  4. Mood: LCSW to follow for evaluation and support.   5. Neuropsych: This patient is capable of making decisions on her own behalf. 6. Skin/Wound Care: Routine pressure relief measures.   7. Fluids/Electrolytes/Nutrition: Monitor I/O especially as on dysphagia diet. Check lytes in am. 8. HTN: Monitor BP every 8 hours. Continue metoprolol and lisinopril daily. Avoid hypotension.   9. Dyslipidemia: intolerant of statins 10.  Constipation- adjust Bowel prog  LOS (Days) 9 A FACE TO FACE EVALUATION WAS  PERFORMED  Mary Le E 06/17/2014, 7:25 AM

## 2014-06-17 NOTE — Plan of Care (Signed)
Problem: RH SAFETY Goal: RH STG DECREASED RISK OF FALL WITH ASSISTANCE STG Decreased Risk of Fall With mod Assistance.  Outcome: Progressing

## 2014-06-17 NOTE — Progress Notes (Signed)
Physical Therapy Weekly Progress Note  Patient Details  Name: Mary Le MRN: [id number] Date of Birth: 09/28/1945  Beginning of progress report period: June 09, 2014 End of progress report period: June 17, 2014  Today's Date: 06/17/2014 PT Individual Time: 1305-1405 PT Individual Time Calculation (min): 60 min   Patient has met 0 of 8 long term goals. Patient is making gradual progress this reporting period and currently requires min assist level for ambulation, transfers, and stairs and mod assist for bed mobility. Patient is most limited by chronic back pain during all standing tasks. Patient's daughter and granddaughter are very supportive and have initiated family training with daughter.   Patient continues to demonstrate the following deficits: L UE > LE hemiplegia, muscle weakness, decreased endurance, impaired timing and sequencing, abnormal tone, unbalanced muscle activation, decreased midline orientation, left side neglect and decreased attention, decreased awareness, and impulsivity/decreased safety awareness, and therefore will continue to benefit from skilled PT intervention to enhance overall performance with activity tolerance, balance, postural control, ability to compensate for deficits, functional use of  left upper extremity and left lower extremity, attention and awareness.  Patient progressing toward long term goals. Continue plan of care.  PT Short Term Goals Week 1:  PT Short Term Goal 1 (Week 1): = LTGs of overall supervision  Skilled Therapeutic Interventions/Progress Updates:   Pt received sitting in w/c, agreeable to therapy. Pt propelled w/c (cushion removed) using R hemi technique x 45 ft with max verbal/visual cues for sequencing and total cues for attending to L for obstacle negotiation. Pt ambulated remainder of way to therapy gym x 100 ft with R min HHA, verbal cues for L heel strike to decrease shuffling and improve LLE clearance. Pt achieved tall  kneeling on mat table with small bench in front with verbal/tactile cues for upright posture and hip extension to neutral for NMR with therapist stabilizing LUE on bench due to increased tone and patient reaching to targets on left to facilitate increased LUE WB. Pt requires multiple rests leaning on elbows on bench due to unrated back pain. Transitioned to sitting edge of raised mat for improved LE activation and reached for horseshoes to toss to challenge dynamic sitting balance while sitting on air disc. Pt requesting need for bathroom. Pt ambulated back to room x 150 ft with R min HHA, continued cues for LLE clearance and decreased R step length to facilitate step-through pattern. Pt requires assist for clothing management and performed hygiene with supervision before walking to sink to wash hands, no cues needed to incorporate LUE. After seated rest in room, gait training with R HHA in home environment in day room with focus on LLE clearance and control of movement as pt tends to be impulsive and continues ambulating while dragging L foot and losing balance. Pt ambulated to water flowers using RUE with sidestepping to right and left, min A. Pt with questions regarding MRI for back pain. Reviewed MRI findings from 06/03/14 with patient and daughter. initiated hands-on family training with daughter for transfers, ambulation in home and controlled environments, and bed mobility at min-mod assist. Pt left semi reclined in bed with all needs within reach, daughter present.   Therapy Documentation Precautions:  Precautions Precautions: Fall Precaution Comments: Lt shoulder subluxation risk, left neglect with right peripheral gaze Restrictions Weight Bearing Restrictions: No Pain: Pain Assessment Pain Assessment: 0-10 Pain Score: 6  Pain Location: Shoulder Pain Orientation: Left Pain Intervention(s): Medication (See eMAR)  See FIM for current functional status  Therapy/Group: Individual  Therapy  [name] 06/17/2014, 2:10 PM

## 2014-06-17 NOTE — Plan of Care (Signed)
Problem: RH BLADDER ELIMINATION Goal: RH STG MANAGE BLADDER WITH ASSISTANCE STG Manage Bladder With mod Assistance  Outcome: Completed/Met Date Met:  06/17/14

## 2014-06-17 NOTE — Progress Notes (Signed)
Occupational Therapy Weekly Progress Note  Patient Details  Name: Mary Le MRN: [id number] Date of Birth: 1946/06/06  Beginning of progress report period: June 09, 2014 End of progress report period: June 17, 2014  Today's Date: 06/17/2014 OT Individual Time: 1100-1203 OT Individual Time Calculation (min): 63 min    Patient has met 2 of 5 short term goals.  Pt continues to make steady progress with OT sessions at this time.  She continues to demonstrate severe hemiparesis in the LUE but only mild paresis in the LLE.  Min assist for all functional transfers and sit to stand as well.  Still with left inattention and neglect requiring mostly mod instructional cueing to scan left to locate items at the sink or in her environment.  Mary Le continues to need mod assist for donning her clothing as perceptually she demonstrates difficulty following hemi-dressing techniques.  Family has been educated on how to safely assist her with PROM exercises with the LUE as well as to help incorporate scanning to the left.  Feel she is on target to reach overall supervision to min assist level goals at discharge in 1 week.  Will continue with current OT treatment plan.   Patient continues to demonstrate the following deficits: decreased balance, LUE and LLE weakness, LUE tone, left neglect, left visual field deficit,  and therefore will continue to benefit from skilled OT intervention to enhance overall performance with BADL.  Patient progressing toward long term goals..  Continue plan of care.  OT Short Term Goals Week 1:  OT Short Term Goal 1 (Week 1): Pt will perform all bathing sit to stand with min assist in the shower. OT Short Term Goal 1 - Progress (Week 1): Met OT Short Term Goal 2 (Week 1): Pt will perform UB dressing to donn pullover shirt with min assist following hemi techniques.  OT Short Term Goal 2 - Progress (Week 1): Not met OT Short Term Goal 3 (Week 1): Pt will perform LB dressing  with min assist following hemi techniques. OT Short Term Goal 3 - Progress (Week 1): Not met OT Short Term Goal 4 (Week 1): Pt/family will return demonstrate PROM exercises for the LUE following handout. OT Short Term Goal 4 - Progress (Week 1): Met OT Short Term Goal 5 (Week 1): Pt will locate at least 3 items placed left of midline during selfcare tasks with no more than min questioing cueing.  OT Short Term Goal 5 - Progress (Week 1): Not met  Skilled Therapeutic Interventions/Progress Updates:    During session worked on bathing, dressing, and grooming.  Pt was able to perform all functional transfers with min assist and no assistive device.  Min assist for bathing with max assist to incorporate the LUE into function.  Still with the need for mod demonstrational cueing to complete dressing following hemi-dressing techniques.  She is able to verbally state that she needs to begin with the LLE and starts but does not complete donning pants on the left before beginning on the right.  Mod demonstrational cueing to sequence donning pullover shirt also as pt attempts to thread arm through the neck opening and out the sleeve instead of going up through the bottom and out the sleeve.  She was able to stand to brush her teeth with min assist but needed mod questioning cues to remember to turn off the water.  Concluded session by having her scan the hallway for items with the color "red" and pointing them out to  therapist.  She was only able to locate approximately 10% of the items placed on the left side.  Replaced 1/2 lap tray with arm trough as pt's LUE is beginning to demonstrate greater tone.   Therapy Documentation Precautions:  Precautions Precautions: Fall Precaution Comments: Lt shoulder subluxation risk, left neglect with right peripheral gaze Restrictions Weight Bearing Restrictions: No  Pain: Pain Assessment Pain Assessment: No/denies pain Pain Score: 0-No pain ADL: See FIM for current  functional status  Therapy/Group: Individual Therapy  Mary Le OTR/L 06/17/2014, 12:54 PM

## 2014-06-18 ENCOUNTER — Inpatient Hospital Stay (HOSPITAL_COMMUNITY): Payer: Medicare Other | Admitting: Speech Pathology

## 2014-06-18 ENCOUNTER — Inpatient Hospital Stay (HOSPITAL_COMMUNITY): Payer: Medicare Other | Admitting: Occupational Therapy

## 2014-06-18 NOTE — Plan of Care (Signed)
Problem: RH SAFETY Goal: RH STG ADHERE TO SAFETY PRECAUTIONS W/ASSISTANCE/DEVICE STG Adhere to Safety Precautions With mod Assistance/Device.  Outcome: Progressing Goal: RH STG DECREASED RISK OF FALL WITH ASSISTANCE STG Decreased Risk of Fall With mod Assistance.  Outcome: Progressing

## 2014-06-18 NOTE — Plan of Care (Signed)
Problem: RH SAFETY Goal: RH STG ADHERE TO SAFETY PRECAUTIONS W/ASSISTANCE/DEVICE STG Adhere to Safety Precautions With mod Assistance/Device.  Outcome: Progressing Goal: RH STG DECREASED RISK OF FALL WITH ASSISTANCE STG Decreased Risk of Fall With mod Assistance.  Outcome: Progressing  Problem: RH BOWEL ELIMINATION Goal: RH STG MANAGE BOWEL WITH ASSISTANCE STG Manage Bowel with min Assistance.  Outcome: Progressing Goal: RH STG MANAGE BOWEL W/MEDICATION W/ASSISTANCE STG Manage Bowel with Medication with min Assistance.  Outcome: Progressing

## 2014-06-18 NOTE — Progress Notes (Signed)
68 y.o. RH female with h/o HTN, morbid obesity, dyslipidemia who was admitted on  06/03/2014 with left-sided weakness. Cranial CT scan negative and patient underwent CTA head/neck which revealed distal right M1 occlusion with question of low density right temporal, parietal lobe and insula.  she underwent endovacular revascularization of R-MCA with thrombectomy by Dr. Estanislado Pandy.  MRI of the brain shows acute infarct right MCA territory in the region of the insula, frontoparietal and high parietal region. Patient intubated thorough 11/15 due to acute respiratory failure.  MRA of the brain with no stenosis or aneurysm and return of flow in R-MCA territory with luxury perfusion.  2D echo showed EF of 65% and no wall motion abnormalities. Carotid Dopplers without significant ICA stenosis. BLE dopplers negative for DVT. TEE with normal LVF, no LAA and negative for PFO. Loop recorder was placed by Dr. [name] on 11/14. BSE done and patient started on dysphagia 2 with thin liquids. Patient with resultant L-HH, right gaze preference, left facial droop with mild dysarthria as well as right hemiparesis. Dr. Leonie Man recommended ASA for embolic infarct due to unknown etiology.  Subjective/Complaints: No complaints except for being cold---bundled up in blankets . Review of Systems - Negative except as above , denies SOB Objective: Vital Signs: Blood pressure 128/61, pulse 64, temperature 98.3 F (36.8 C), temperature source Oral, resp. rate 18, height 5\' 7"  (1.702 m), weight 101.334 kg (223 lb 6.4 oz), SpO2 99 %. No results found. No results found for this or any previous visit (from the past 72 hour(s)).   HEENT: normal Cardio: RRR and no murmur Resp: CTA B/L and unlabored GI: BS positive and NT Extremity:  Pulses positive and No Edema Skin:   Intact Neuro: alert/flat, Cranial Nerve Abnormalities Left central VII, Abnormal Sensory absent LT LUE, Abnormal Motor 0/5 LUE, 3- LLE, 5/5 on Right, Tone:  Increased Left  biceps , severe L Inattention, modified Ashworth score 3 at the biceps and pectoralis Musc/Skel:  Other no pain with LUE ROM Gen NAD   Assessment/Plan: 1. Functional deficits secondary to Right MCA infarct uncertain etiology FIM: FIM - Bathing Bathing Steps Patient Completed: Chest, Left Arm, Abdomen, Front perineal area, Right upper leg, Left upper leg, Buttocks, Left lower leg (including foot), Right lower leg (including foot) Bathing: 4: Min-Patient completes 8-9 58f 10 parts or 75+ percent  FIM - Upper Body Dressing/Undressing Upper body dressing/undressing steps patient completed: Put head through opening of pull over shirt/dress, Thread/unthread right sleeve of pullover shirt/dresss Upper body dressing/undressing: 3: Mod-Patient completed 50-74% of tasks FIM - Lower Body Dressing/Undressing Lower body dressing/undressing steps patient completed: Thread/unthread right pants leg, Thread/unthread left pants leg, Don/Doff right sock, Don/Doff left sock, Don/Doff right shoe, Don/Doff left shoe Lower body dressing/undressing: 4: Min-Patient completed 75 plus % of tasks  FIM - Toileting Toileting steps completed by patient: Performs perineal hygiene Toileting Assistive Devices: Grab bar or rail for support Toileting: 2: Max-Patient completed 1 of 3 steps  FIM - Radio producer Devices: Elevated toilet seat, Grab bars Toilet Transfers: 4-To toilet/BSC: Min A (steadying Pt. > 75%)  FIM - Bed/Chair Transfer Bed/Chair Transfer Assistive Devices: Arm rests (R HHA) Bed/Chair Transfer: 4: Chair or W/C > Bed: Min A (steadying Pt. > 75%), 4: Bed > Chair or W/C: Min A (steadying Pt. > 75%), 3: Supine > Sit: Mod A (lifting assist/Pt. 50-74%/lift 2 legs  FIM - Locomotion: Wheelchair Distance: 45 Locomotion: Wheelchair: 1: Travels less than 50 ft with minimal assistance (Pt.>75%)  FIM - Locomotion: Ambulation Locomotion: Ambulation Assistive Devices: Other (comment) (R  HHA) Ambulation/Gait Assistance: 4: Min assist Locomotion: Ambulation: 4: Travels 150 ft or more with minimal assistance (Pt.>75%)  Comprehension Comprehension Mode: Auditory Comprehension: 5-Follows basic conversation/direction: With no assist  Expression Expression Mode: Verbal Expression: 5-Expresses basic needs/ideas: With no assist  Social Interaction Social Interaction: 5-Interacts appropriately 90% of the time - Needs monitoring or encouragement for participation or interaction.  Problem Solving Problem Solving: 4-Solves basic 75 - 89% of the time/requires cueing 10 - 24% of the time  Memory Memory: 4-Recognizes or recalls 75 - 89% of the time/requires cueing 10 - 24% of the time   Medical Problem List and Plan: 1. Functional deficits secondary to R-MCA infarct, increasing tone cont PT/OT, spasticity add baclofen, not Zanaflex secondary to low BP --on plavix secondary to ASA intolerance 2.  DVT Prophylaxis/Anticoagulation: Pharmaceutical: Lovenox 3. Headaches/ Pain Management:    oxycodone prn. Reports improvement  4. Mood: LCSW to follow for evaluation and support.   5. Neuropsych: This patient is capable of making decisions on her own behalf. 6. Skin/Wound Care: Routine pressure relief measures.   7. Fluids/Electrolytes/Nutrition: Monitor I/O especially as on dysphagia diet.   8. HTN: Monitor BP every 8 hours. Continue metoprolol and lisinopril daily. Avoid hypotension---better control.   9. Dyslipidemia: intolerant of statins 10.  Constipation- adjusting Bowel prog  LOS (Days) 10 A FACE TO FACE EVALUATION WAS PERFORMED  Allyn Bartelson T 06/18/2014, 8:11 AM

## 2014-06-18 NOTE — Progress Notes (Signed)
Nursing Note: Robitussin given per request for sore throat.wbb

## 2014-06-18 NOTE — Progress Notes (Signed)
Occupational Therapy Session Note  Patient Details  Name: Mary Le MRN: [id number] Date of Birth: Jun 12, 1946  Today's Date: 06/18/2014 OT Individual Time: 1430-1500 OT Individual Time Calculation (min): 30 min    Short Term Goals: Week 2:  OT Short Term Goal 1 (Week 2): Pt will perform LB dressing with min assist following hemi techniques. OT Short Term Goal 2 (Week 2): Pt will locate at least 3 items placed left of midline during selfcare tasks with no more than min questioing cueing.  OT Short Term Goal 3 (Week 2): Pt will perform UB dressing to donn pullover shirt with min assist following hemi techniques.  OT Short Term Goal 4 (Week 2): Pt will perform toilet transfers with close supervision during selfcare tasks.  OT Short Term Goal 5 (Week 2): Pt will use the LUE with max assist as a stabilizer during bathing and grooming tasks.   Skilled Therapeutic Interventions/Progress Updates:  Upon entering the room, pt supine in bed with head elevated and daughter present in room. OT session with focus on pt/family education, self ROM L UE, and PROM exercises. OT educated pt and daughter on self ROM for L UE in all planes. Pt returning demonstration x 10 reps of each. OT performed PROM for shoulder flexion/ext and shoulder abduction/adduction x 5 reps with pt wincing at end point but reporting pain decreasing with continued stretch. OT recommended pt perform self ROM exercises throughout day for continued stretch and self awareness of L UE. Pt supine in bed with bed alarm on and daughter remaining in room upon exit.   Therapy Documentation Precautions:  Precautions Precautions: Fall Precaution Comments: Lt shoulder subluxation risk, left neglect with right peripheral gaze Restrictions Weight Bearing Restrictions: No  Pain Assessment Pain Assessment: 0-10 Pain Score: 0-No pain Pain Type: Acute pain Pain Location: Arm Pain Orientation: Left Pain Descriptors / Indicators: Sore Pain  Onset: Gradual Pain Intervention(s): Medication (See eMAR)  See FIM for current functional status  Therapy/Group: Individual Therapy  Phineas Semen 06/18/2014, 3:43 PM

## 2014-06-19 ENCOUNTER — Inpatient Hospital Stay (HOSPITAL_COMMUNITY): Payer: Medicare Other | Admitting: Physical Therapy

## 2014-06-19 ENCOUNTER — Inpatient Hospital Stay (HOSPITAL_COMMUNITY): Payer: Medicare Other

## 2014-06-19 ENCOUNTER — Inpatient Hospital Stay (HOSPITAL_COMMUNITY): Payer: Medicare Other | Admitting: Occupational Therapy

## 2014-06-19 NOTE — Progress Notes (Signed)
Physical Therapy Session Note  Patient Details  Name: Mary Le MRN: [id number] Date of Birth: [birth year]  Today's Date: 06/19/2014 PT Individual Time: 0800-0900 PT Individual Time Calculation (min): 60 min   Short Term Goals: Week 2:     Skilled Therapeutic Interventions/Progress Updates:    Pt received seated in w/c, agreeable to participate in therapy. Donned shoes w/ MaxA. Pt propelled w/c w/ ModA, max VC's for obstacle avoidance on L. Noted pt w/ improved form when using BLE without use of RUE, pt had improved use of LLE when R hand in lap holding L hand. When using RUE to propel pt required max cueing to use LLE. Pt able to propel 49' before fatiguing and requiring totalA to transport remaining distance to rehab gym. SPT w/c>mat table w/ Min HHA on R. Worked on mini-squats and sit<>stands at edge of mat w/ 2" riser under RLE for forced use of LLE, CGA to come to stand. Moved sit<>supine w/ Min-ModA to manage LUE/LLE. Bridging x10 in supine for proximal strengthening. Pt moved supine<>prone w/ MinA for managing LUE, unable to push up to quadruped. Tolerated prone for 2 minutes before complaining of increased pain in L upper arm. In standing pt reached to L upper field then L mid field w/ RUE for horseshoes w/ 2" riser under R foot to increase WBing through LLE, MinA to remain standing. Pt able to do 2 horseshoes at a time before needing to sit and rest due to back pain. Pt ambulated 150' around rehab gym w/ R HHA for horseshoe finding activity w/ all horseshoes on L side of environment, pt able to find 5/8 horseshoes independently, remaining w/ mod cueing. Pt propelled w/c w/ BLE 50' towards room and MinA, transported remaining distance w/ TotalA. Pt left seated in w/c w/ all needs within reach.   Therapy Documentation Precautions:  Precautions Precautions: Fall Precaution Comments: Lt shoulder subluxation risk, left neglect with right peripheral gaze Restrictions Weight Bearing  Restrictions: No Vital Signs: Therapy Vitals Temp: 98 F (36.7 C) Temp Source: Oral Resp: 17 Oxygen Therapy SpO2: 98 % O2 Device: Not Delivered Pain:  No/denies pain  See FIM for current functional status  Therapy/Group: Individual Therapy  Rada Hay  Rada Hay, PT, DPT 06/19/2014, 7:34 AM

## 2014-06-19 NOTE — Progress Notes (Signed)
Physical Therapy Session Note  Patient Details  Name: Mary Le MRN: [id number] Date of Birth: 1946-04-16  Today's Date: 06/19/2014 PT Individual Time: [phone] PT Individual Time Calculation (min): 60 min   Short Term Goals: Week 1:  PT Short Term Goal 1 (Week 1): = LTGs of overall supervision  Skilled Therapeutic Interventions/Progress Updates:    Pt with limited proximal motor control and mobility noted proximally contributing to pain and decreased safety in functional mobility. Pt with some improved pain s/p manual techniques, but limited carry over into function. Pt benefits from cueing during transfers for increased safety.  Therapy Documentation Precautions:  Precautions Precautions: Fall Precaution Comments: Lt shoulder subluxation risk, left neglect with right peripheral gaze Restrictions Weight Bearing Restrictions: No Pain: Pain Assessment Pain Assessment: 0-10 Pain Score: 4  Pain Location:  (low back) Pain Orientation: Left Pain Intervention(s):  (graded activity and manual) Mobility:  Min A with cues for sequencing, hand placement and safety Locomotion : Ambulation Ambulation/Gait Assistance: 4: Min assist  Other Treatments:  Pt performs LUE forced use with anterior weight shifts, reaching to max excursion laterally scap retraction with ventilation, scap retraction with ant chest breathing 2x10. Pt performs dynamic sitting and standing balance with dual motor tasks including weight shifting, reaching, and grasping within functional context. Pt educated on rehab plan and forced use. T/S and quadratus lumborum releases performed.  See FIM for current functional status  Therapy/Group: Individual  Monia Pouch 06/19/2014, 1:26 PM

## 2014-06-19 NOTE — Progress Notes (Signed)
Occupational Therapy Session Note  Patient Details  Name: Mary Le MRN: [id number] Date of Birth: 06/03/1946  Today's Date: 06/19/2014 OT Individual Time: 0850-0950 OT Individual Time Calculation (min): 60 min    Skilled Therapeutic Interventions/Progress Updates: When asked if patient normally walks to shower to assist with cane or other device, she replied, "All I need is to hold your hand to walk in there.  And I need to make sure I look left."   ADL inroom shower with focuson attending to left environment and body.  During the shower without prompts, she maintained postural control and alignment.  As well,she initated cleansing her L UE.  She was a little fatigued from the immediately preceding therapy session and therefore was given a little assist than she stated she generally is given.  As well, she required two prompts during the session to initiate dressing her left side of body before her right.  BP and heart rate appeared to remain stable during the session - with no signs/symptoms otherwise.     Therapy Documentation Precautions:  Precautions Precautions: Fall Precaution Comments: Lt shoulder subluxation risk, left neglect with right peripheral gaze Restrictions Weight Bearing Restrictions: No   Pain: Pain Assessment Pain Assessment: No/denies pain  See FIM for current functional status  Therapy/Group: Individual Therapy  Alfredia Ferguson [facility] 06/19/2014, 10:55 AM

## 2014-06-19 NOTE — Progress Notes (Signed)
68 y.o. RH female with h/o HTN, morbid obesity, dyslipidemia who was admitted on  06/03/2014 with left-sided weakness. Cranial CT scan negative and patient underwent CTA head/neck which revealed distal right M1 occlusion with question of low density right temporal, parietal lobe and insula.  she underwent endovacular revascularization of R-MCA with thrombectomy by Dr. Estanislado Pandy.  MRI of the brain shows acute infarct right MCA territory in the region of the insula, frontoparietal and high parietal region. Patient intubated thorough 11/15 due to acute respiratory failure.  MRA of the brain with no stenosis or aneurysm and return of flow in R-MCA territory with luxury perfusion.  2D echo showed EF of 65% and no wall motion abnormalities. Carotid Dopplers without significant ICA stenosis. BLE dopplers negative for DVT. TEE with normal LVF, no LAA and negative for PFO. Loop recorder was placed by Dr. [name] on 11/14. BSE done and patient started on dysphagia 2 with thin liquids. Patient with resultant L-HH, right gaze preference, left facial droop with mild dysarthria as well as right hemiparesis. Dr. Leonie Man recommended ASA for embolic infarct due to unknown etiology.  Subjective/Complaints: Right side a little sore, even trunk. Slept well.  . Review of Systems - Negative except as above , denies SOB Objective: Vital Signs: Blood pressure 125/53, pulse 63, temperature 98 F (36.7 C), temperature source Oral, resp. rate 17, height 5\' 7"  (1.702 m), weight 101.334 kg (223 lb 6.4 oz), SpO2 98 %. No results found. No results found for this or any previous visit (from the past 72 hour(s)).   HEENT: normal Cardio: RRR and no murmur Resp: CTA B/L and unlabored GI: BS positive and NT Extremity:  Pulses positive and No Edema Skin:   Intact Neuro: alert/flat, Cranial Nerve Abnormalities Left central VII, Abnormal Sensory absent LT LUE, Abnormal Motor 0/5 LUE, 3- LLE, 5/5 on Right, Tone:  Increased Left biceps 2-3/4  , severe L Inattention, modified Ashworth score 3 at the biceps and pectoralis Musc/Skel:  Other no pain with LUE ROM Gen NAD   Assessment/Plan: 1. Functional deficits secondary to Right MCA infarct uncertain etiology FIM: FIM - Bathing Bathing Steps Patient Completed: Chest, Left Arm, Abdomen, Front perineal area, Right upper leg, Left upper leg, Buttocks, Left lower leg (including foot), Right lower leg (including foot) Bathing: 4: Min-Patient completes 8-9 74f 10 parts or 75+ percent  FIM - Upper Body Dressing/Undressing Upper body dressing/undressing steps patient completed: Put head through opening of pull over shirt/dress, Thread/unthread right sleeve of pullover shirt/dresss Upper body dressing/undressing: 3: Mod-Patient completed 50-74% of tasks FIM - Lower Body Dressing/Undressing Lower body dressing/undressing steps patient completed: Thread/unthread right pants leg, Thread/unthread left pants leg, Don/Doff right sock, Don/Doff left sock, Don/Doff right shoe, Don/Doff left shoe Lower body dressing/undressing: 4: Min-Patient completed 75 plus % of tasks  FIM - Toileting Toileting steps completed by patient: Performs perineal hygiene Toileting Assistive Devices: Grab bar or rail for support Toileting: 2: Max-Patient completed 1 of 3 steps  FIM - Radio producer Devices: Grab bars Toilet Transfers: 4-To toilet/BSC: Min A (steadying Pt. > 75%), 4-From toilet/BSC: Min A (steadying Pt. > 75%)  FIM - Bed/Chair Transfer Bed/Chair Transfer Assistive Devices: Arm rests (R HHA) Bed/Chair Transfer: 4: Chair or W/C > Bed: Min A (steadying Pt. > 75%), 4: Bed > Chair or W/C: Min A (steadying Pt. > 75%), 3: Supine > Sit: Mod A (lifting assist/Pt. 50-74%/lift 2 legs  FIM - Locomotion: Wheelchair Distance: 45 Locomotion: Wheelchair: 1: Travels less  than 50 ft with minimal assistance (Pt.>75%) FIM - Locomotion: Ambulation Locomotion: Ambulation Assistive Devices:  Other (comment) (R HHA) Ambulation/Gait Assistance: 4: Min assist Locomotion: Ambulation: 4: Travels 150 ft or more with minimal assistance (Pt.>75%)  Comprehension Comprehension Mode: Auditory Comprehension: 5-Follows basic conversation/direction: With no assist  Expression Expression Mode: Verbal Expression: 5-Expresses basic needs/ideas: With no assist  Social Interaction Social Interaction: 5-Interacts appropriately 90% of the time - Needs monitoring or encouragement for participation or interaction.  Problem Solving Problem Solving: 4-Solves basic 75 - 89% of the time/requires cueing 10 - 24% of the time  Memory Memory: 4-Recognizes or recalls 75 - 89% of the time/requires cueing 10 - 24% of the time   Medical Problem List and Plan: 1. Functional deficits secondary to R-MCA infarct, increasing tone cont PT/OT, spasticity add baclofen, not Zanaflex secondary to low BP --on plavix secondary to ASA intolerance 2.  DVT Prophylaxis/Anticoagulation: Pharmaceutical: Lovenox 3. Headaches/ Pain Management:    oxycodone prn. Heat, ROM for right sided muscle soreness 4. Mood: LCSW to follow for evaluation and support.   5. Neuropsych: This patient is capable of making decisions on her own behalf. 6. Skin/Wound Care: Routine pressure relief measures.   7. Fluids/Electrolytes/Nutrition: Monitor I/O especially as on dysphagia diet.   8. HTN: Monitor BP every 8 hours. Continue metoprolol and lisinopril daily. Avoid hypotension---better control.   9. Dyslipidemia: intolerant of statins 10.  Constipation- adjusting Bowel prog 11. Hypertonicity: baclofen 10mg  TID  LOS (Days) 11 A FACE TO FACE EVALUATION WAS PERFORMED  Eleesha Purkey T 06/19/2014, 7:54 AM

## 2014-06-19 NOTE — Plan of Care (Signed)
Problem: RH SAFETY Goal: RH STG ADHERE TO SAFETY PRECAUTIONS W/ASSISTANCE/DEVICE STG Adhere to Safety Precautions With mod Assistance/Device.  Outcome: Progressing Goal: RH STG DECREASED RISK OF FALL WITH ASSISTANCE STG Decreased Risk of Fall With mod Assistance.  Outcome: Progressing

## 2014-06-20 ENCOUNTER — Inpatient Hospital Stay (HOSPITAL_COMMUNITY): Payer: Medicare Other | Admitting: Speech Pathology

## 2014-06-20 ENCOUNTER — Inpatient Hospital Stay (HOSPITAL_COMMUNITY): Payer: Medicare Other | Admitting: Occupational Therapy

## 2014-06-20 ENCOUNTER — Inpatient Hospital Stay (HOSPITAL_COMMUNITY): Payer: Medicare Other

## 2014-06-20 DIAGNOSIS — M47816 Spondylosis without myelopathy or radiculopathy, lumbar region: Secondary | ICD-10-CM

## 2014-06-20 MED ORDER — TRAMADOL-ACETAMINOPHEN 37.5-325 MG PO TABS
1.0000 | ORAL_TABLET | ORAL | Status: DC | PRN
  Administered 2014-06-20 – 2014-06-22 (×4): 1 via ORAL
  Filled 2014-06-20 (×4): qty 1

## 2014-06-20 NOTE — Progress Notes (Signed)
Occupational Therapy Session Note  Patient Details  Name: Mary Le MRN: [id number] Date of Birth: [birth year]  Today's Date: 06/20/2014 OT Individual Time: 1300-1403 OT Individual Time Calculation (min): 63 min    Short Term Goals: Week 2:  OT Short Term Goal 1 (Week 2): Pt will perform LB dressing with min assist following hemi techniques. OT Short Term Goal 2 (Week 2): Pt will locate at least 3 items placed left of midline during selfcare tasks with no more than min questioing cueing.  OT Short Term Goal 3 (Week 2): Pt will perform UB dressing to donn pullover shirt with min assist following hemi techniques.  OT Short Term Goal 4 (Week 2): Pt will perform toilet transfers with close supervision during selfcare tasks.  OT Short Term Goal 5 (Week 2): Pt will use the LUE with max assist as a stabilizer during bathing and grooming tasks.   Skilled Therapeutic Interventions/Progress Updates:    Pt performed bathing and dressing during session.  Min hand held assist for all functional transfers to and from the walk-in shower.  Mod instructional cueing to sequence bathing as pt demonstrates decreased organization when attempting to wash neglecting the left side at times.  Total assist needed to integrate the LUE into washing as pt currently demonstrates Brunnstrum stage I in the hand and stage II in the arm with increased tone noted in the internal rotators, shoulder extensors, and elbow flexors.  Pt continues to need max assist for sequencing and following hemi dressing techniques as pt has difficulty completing tasks on the left side, including donning pants and underpants before attempting the right.  Mod assist and max demonstrational cueing for donning pullover shirt as well.    Therapy Documentation Precautions:  Precautions Precautions: Fall Precaution Comments: Lt shoulder subluxation risk, left neglect with right peripheral gaze Restrictions Weight Bearing Restrictions:  No  Pain: Pain Assessment Pain Assessment: No/denies pain ADL: See FIM for current functional status  Therapy/Group: Individual Therapy  Merary Garguilo OTR/L 06/20/2014, 3:11 PM

## 2014-06-20 NOTE — Progress Notes (Signed)
68 y.o. RH female with h/o HTN, morbid obesity, dyslipidemia who was admitted on  06/03/2014 with left-sided weakness. Cranial CT scan negative and patient underwent CTA head/neck which revealed distal right M1 occlusion with question of low density right temporal, parietal lobe and insula.  she underwent endovacular revascularization of R-MCA with thrombectomy by Dr. Estanislado Pandy.  MRI of the brain shows acute infarct right MCA territory in the region of the insula, frontoparietal and high parietal region. Patient intubated thorough 11/15 due to acute respiratory failure.  MRA of the brain with no stenosis or aneurysm and return of flow in R-MCA territory with luxury perfusion.  2D echo showed EF of 65% and no wall motion abnormalities. Carotid Dopplers without significant ICA stenosis. BLE dopplers negative for DVT. TEE with normal LVF, no LAA and negative for PFO. Loop recorder was placed by Dr. [name] on 11/14.Dr. Leonie Man recommended ASA for embolic infarct due to unknown etiology.  Subjective/Complaints: Back pain, interested in nonsedating med Slept well.  . Review of Systems - Negative except as above , denies SOB Objective: Vital Signs: Blood pressure 161/64, pulse 68, temperature 98.5 F (36.9 C), temperature source Oral, resp. rate 16, height 5\' 7"  (1.702 m), weight 101.334 kg (223 lb 6.4 oz), SpO2 100 %. No results found. No results found for this or any previous visit (from the past 72 hour(s)).   HEENT: normal Cardio: RRR and no murmur Resp: CTA B/L and unlabored GI: BS positive and NT Extremity:  Pulses positive and No Edema Skin:   Intact Neuro: alert/flat, Cranial Nerve Abnormalities Left central VII, Abnormal Sensory absent LT LUE, Abnormal Motor 0/5 LUE, 3- LLE, 5/5 on Right, Tone:  Increased Left biceps 2-3/4 , severe L Inattention, modified Ashworth score 3 at the biceps and pectoralis Musc/Skel:  Other no pain with LUE ROM Gen NAD   Assessment/Plan: 1. Functional deficits  secondary to Right MCA infarct uncertain etiology Requiring comprehensive intensive rehabilitation with 24/7 rehab RN and MD to treat medical comorbidities as well as 3 hr /day PT/OT/SLP FIM: FIM - Bathing Bathing Steps Patient Completed: Chest, Left Arm, Abdomen, Front perineal area, Buttocks, Right upper leg, Left upper leg Bathing: 3: Mod-Patient completes 5-7 47f 10 parts or 50-74%  FIM - Upper Body Dressing/Undressing Upper body dressing/undressing steps patient completed: Put head through opening of pull over shirt/dress, Thread/unthread right sleeve of pullover shirt/dresss Upper body dressing/undressing: 3: Mod-Patient completed 50-74% of tasks FIM - Lower Body Dressing/Undressing Lower body dressing/undressing steps patient completed: Thread/unthread right underwear leg, Thread/unthread right pants leg, Pull underwear up/down, Pull pants up/down, Don/Doff right sock Lower body dressing/undressing: 3: Mod-Patient completed 50-74% of tasks  FIM - Toileting Toileting steps completed by patient: Performs perineal hygiene Toileting Assistive Devices: Grab bar or rail for support Toileting: 2: Max-Patient completed 1 of 3 steps  FIM - Radio producer Devices: Grab bars Toilet Transfers: 4-To toilet/BSC: Min A (steadying Pt. > 75%), 4-From toilet/BSC: Min A (steadying Pt. > 75%)  FIM - Bed/Chair Transfer Bed/Chair Transfer Assistive Devices: Arm rests (R HHA) Bed/Chair Transfer: 4: Bed > Chair or W/C: Min A (steadying Pt. > 75%), 4: Chair or W/C > Bed: Min A (steadying Pt. > 75%)  FIM - Locomotion: Wheelchair Distance: 45 Locomotion: Wheelchair: 0: Activity did not occur FIM - Locomotion: Ambulation Locomotion: Ambulation Assistive Devices: Other (comment) (R HHA) Ambulation/Gait Assistance: 4: Min assist Locomotion: Ambulation: 4: Travels 150 ft or more with minimal assistance (Pt.>75%)  Comprehension Comprehension Mode: Auditory Comprehension:  5-Follows basic conversation/direction: With no assist  Expression Expression Mode: Verbal Expression: 5-Expresses basic needs/ideas: With no assist  Social Interaction Social Interaction: 5-Interacts appropriately 90% of the time - Needs monitoring or encouragement for participation or interaction.  Problem Solving Problem Solving: 4-Solves basic 75 - 89% of the time/requires cueing 10 - 24% of the time  Memory Memory: 4-Recognizes or recalls 75 - 89% of the time/requires cueing 10 - 24% of the time   Medical Problem List and Plan: 1. Functional deficits secondary to R-MCA infarct, increasing tone cont PT/OT, spasticity add baclofen, not Zanaflex secondary to low BP --on plavix secondary to ASA intolerance 2.  DVT Prophylaxis/Anticoagulation: Pharmaceutical: Lovenox 3. Headaches/ Pain Management:    Change oxy IR to tramadol. Heat, ROM for right sided muscle soreness 4. Mood: LCSW to follow for evaluation and support.   5. Neuropsych: This patient is capable of making decisions on her own behalf. 6. Skin/Wound Care: Routine pressure relief measures.   7. Fluids/Electrolytes/Nutrition: Monitor I/O especially as on dysphagia diet.   8. HTN: Monitor BP every 8 hours. Continue metoprolol and lisinopril daily. Avoid hypotension---better control.   9. Dyslipidemia: intolerant of statins 10.  Constipation- adjusting Bowel prog 11. Hypertonicity: baclofen 10mg  TID  LOS (Days) 12 A FACE TO FACE EVALUATION WAS PERFORMED  Thedford Bunton E 06/20/2014, 8:34 AM

## 2014-06-20 NOTE — Plan of Care (Signed)
Problem: RH SAFETY Goal: RH STG ADHERE TO SAFETY PRECAUTIONS W/ASSISTANCE/DEVICE STG Adhere to Safety Precautions With mod. Assistance/Device.  Outcome: Progressing     

## 2014-06-20 NOTE — Progress Notes (Signed)
Speech Language Pathology Daily Session Note  Patient Details  Name: Mary Le MRN: [id number] Date of Birth: 1945-10-07  Today's Date: 06/20/2014 SLP Individual Time: 1101-1201 SLP Individual Time Calculation (min): 60 min  Short Term Goals: Week 2: SLP Short Term Goal 1 (Week 2): Pt will consume trials of regular textures with Min A multimodal cues for buccal clearance. SLP Short Term Goal 2 (Week 2): Pt will sustain attention to basic functional tasks for 15 minutes with Min A multimodal cues. SLP Short Term Goal 3 (Week 2): Pt will attend to left field of environment during functional tasks with Min A multimodal cues.  SLP Short Term Goal 4 (Week 2): Pt will demonstrate emergent awareness during functional tasks with Mod A multimodal cues.  SLP Short Term Goal 5 (Week 2): Pt will utilize external memory aids to recall new, daily information with Mod A multimodal cues. SLP Short Term Goal 6 (Week 2): Pt will increase overall intelligibility to 90% at the sentence level with supervision cues for utilization of increased vocal intensity and over articulation.   Skilled Therapeutic Interventions:  Pt was seen for skilled ST targeting goals for cognition.  Upon arrival, pt was seated upright in wheelchair, awake, alert, and agreeable to participate in [place].  Pt requested assistance for using her phone as she reported recent difficulty returning her children's phone calls.  SLP completed skilled observations while pt was using her phone to locate her contacts list and noted pt to have poor error awareness as well as decreased thought organization and working memory for sequencing steps and locating targeted information. As a result, SLP reoriented pt to frequently used buttons and their functions (i.e. Back button, home button) via repetitive practice.  Pt was able to return demonstration of the abovementioned skills to call her daughter with overall min assist verbal cues.  Additionally, pt recalled  and used at least 2 alternative means of locating and using her contacts list with min assist following repetitive practice.  Pt was noted with improved emergent awareness of deficits and was able to identify at least 3 ways in which her current physical and cognitive deficits will impact her functional independence at home (i.e. Cooking, driving, using the bathroom).  Pt continues to make progress towards meeting goals.  Continue per current plan of care.     FIM:  Comprehension Comprehension Mode: Auditory Comprehension: 5-Follows basic conversation/direction: With extra time/assistive device Expression Expression Mode: Verbal Expression: 5-Expresses basic needs/ideas: With extra time/assistive device Social Interaction Social Interaction: 5-Interacts appropriately 90% of the time - Needs monitoring or encouragement for participation or interaction. Problem Solving Problem Solving: 4-Solves basic 75 - 89% of the time/requires cueing 10 - 24% of the time Memory Memory: 4-Recognizes or recalls 75 - 89% of the time/requires cueing 10 - 24% of the time FIM - Eating Eating Activity: 5: Supervision/cues;6: Modified consistency diet: (comment) (dys3)  Pain Pain Assessment Pain Assessment: No/denies pain  Therapy/Group: Individual Therapy  [name], Selinda Orion 06/20/2014, 3:19 PM

## 2014-06-20 NOTE — Plan of Care (Signed)
Problem: RH Simple Meal Prep Goal: LTG Patient will perform simple meal prep w/assist (OT) LTG: Patient will perform simple meal prep with assistance, with/without cues (OT).  Outcome: Not Applicable Date Met:  62/86/38

## 2014-06-20 NOTE — Progress Notes (Addendum)
Physical Therapy Session Note  Patient Details  Name: Mary Le MRN: [id number] Date of Birth: [birth year]  Today's Date: 06/20/2014 PT Individual Time: 0905-1005 PT Individual Time Calculation (min): 60 min   Short Term Goals: Week 1:  PT Short Term Goal 1 (Week 1): = LTGs of overall supervision     Skilled Therapeutic Interventions/Progress Updates:   Pt stated she needed to use BR for BM.  Min assist toilet transfer.  See FIM. neuromuscular re-education via VCs, manual cues, positioning for : - rolling R with LUE across chest and bil LEs pushing on bed, x 6 reps -R sidelying> sit with mod assist -midline orientation and L attention during R hand retrieving and placing of clothes pins on rod to her L; LUE supported on wedge for wt bearing -locking/unlocking L brake with L hand with hand over hand assist - gait without AD x 15' providing LUE support -pelvic dissociation during scooting forward/backward in w/c during transfers  W/c back added to support upright posture and limit posterior pelvic tilt.  Pt returned to room and left sitting up in w/c with L UE trough in place; quick release belt applied and all needs left within reach.    Therapy Documentation Precautions:  Precautions Precautions: Fall Precaution Comments: Lt shoulder subluxation risk, left neglect with right peripheral gaze Restrictions Weight Bearing Restrictions: No   Pain: Pain Assessment Pain Assessment: No/denies pain      See FIM for current functional status  Therapy/Group: Individual Therapy  [name] 06/20/2014, 4:57 PM

## 2014-06-20 NOTE — Plan of Care (Signed)
Problem: RH SAFETY Goal: RH STG ADHERE TO SAFETY PRECAUTIONS W/ASSISTANCE/DEVICE STG Adhere to Safety Precautions With mod Assistance/Device.  Outcome: Progressing Goal: RH STG DECREASED RISK OF FALL WITH ASSISTANCE STG Decreased Risk of Fall With mod Assistance.  Outcome: Progressing

## 2014-06-20 NOTE — Plan of Care (Signed)
Problem: RH SAFETY Goal: RH STG ADHERE TO SAFETY PRECAUTIONS W/ASSISTANCE/DEVICE STG Adhere to Safety Precautions With mod Assistance/Device.  Outcome: Progressing Goal: RH STG DECREASED RISK OF FALL WITH ASSISTANCE STG Decreased Risk of Fall With mod Assistance.  Outcome: Progressing  Problem: RH BOWEL ELIMINATION Goal: RH STG MANAGE BOWEL W/MEDICATION W/ASSISTANCE STG Manage Bowel with Medication with min Assistance.  Outcome: Progressing

## 2014-06-20 NOTE — Plan of Care (Signed)
Problem: RH SAFETY Goal: RH STG DECREASED RISK OF FALL WITH ASSISTANCE STG Decreased Risk of Fall With mod Assistance.  Outcome: Progressing

## 2014-06-21 ENCOUNTER — Encounter (HOSPITAL_COMMUNITY): Payer: Medicare Other | Admitting: Occupational Therapy

## 2014-06-21 ENCOUNTER — Inpatient Hospital Stay (HOSPITAL_COMMUNITY): Payer: Medicare Other | Admitting: *Deleted

## 2014-06-21 ENCOUNTER — Inpatient Hospital Stay (HOSPITAL_COMMUNITY): Payer: Medicare Other | Admitting: Speech Pathology

## 2014-06-21 ENCOUNTER — Inpatient Hospital Stay (HOSPITAL_COMMUNITY): Payer: Medicare Other | Admitting: Physical Therapy

## 2014-06-21 DIAGNOSIS — M47816 Spondylosis without myelopathy or radiculopathy, lumbar region: Secondary | ICD-10-CM

## 2014-06-21 NOTE — Plan of Care (Signed)
Problem: RH SAFETY Goal: RH STG ADHERE TO SAFETY PRECAUTIONS W/ASSISTANCE/DEVICE STG Adhere to Safety Precautions With mod Assistance/Device.  Outcome: Progressing Goal: RH STG DECREASED RISK OF FALL WITH ASSISTANCE STG Decreased Risk of Fall With mod Assistance.  Outcome: Progressing

## 2014-06-21 NOTE — Progress Notes (Signed)
Physical Therapy Session Note  Patient Details  Name: Mary Le MRN: [id number] Date of Birth: Mar 18, 1946  Today's Date: 06/21/2014 PT Individual Time: 1130-1200 and 1300-1336 PT Individual Time Calculation (min): 30 min and 36 min  Short Term Goals: Week 1:  PT Short Term Goal 1 (Week 1): = LTGs of overall supervision  Skilled Therapeutic Interventions/Progress Updates:   Session 1: Pt received sitting in w/c, handoff from SLP. Pt requesting to continue working on puzzle. Patient performed standing at tall table with focus on scanning to left to complete jigsaw puzzle while therapist performing passive stretch into elbow extension and facilitating WB through LUE to decrease tone. Pt requires max cues to scan to L to locate puzzle pieces and mod questioning cues for putting puzzle together. Pt requires 3 seated rest breaks due to unrated chronic back pain in standing. Gait training with no AD x 75 ft with close supervision and verbal cues for L heel strike/increased L step length. Pt returned to room and left sitting in w/c with all needs within reach.   Session 2:  Pt received sitting in w/c, agreeable to therapy. Pt negotiated up/down 5 stairs using R rail with supervision and verbal cues for sequencing. After seated rest, pt attempted to negotiate up/down 3 stairs without rails and supervision-min A to prevent LOB. Pt performed car transfer to small SUV height with supervision to bring legs in/out of car and mod A to reposition once seated in car for upright posture. Anticipate pt will do better with actual car transfer due to raised height of floor board in simulated car. Pt reporting need for bathroom. Pt ambulated in room into bathroom with min guard and performed toilet transfer with grab bar and min A. Pt requires assist for clothing management and supervision for hygiene. Pt ambulated to sink to wash hands with supervision and requesting to return to bed due to fatigue. Pt transferred sit >  supine with mod A for BLE management and left semi reclined in bed with all needs within reach. Discussed DME needs in preparation for discharge this week, pt reporting she can borrow w/c. Pt educated to find out size of w/c to see if it is appropriate, otherwise will need to order manual w/c for patient. Plan for family training with daughter tomorrow.   Therapy Documentation Precautions:  Precautions Precautions: Fall Precaution Comments: Lt shoulder subluxation risk, left neglect with right peripheral gaze Restrictions Weight Bearing Restrictions: No Vital Signs: Therapy Vitals Pulse Rate: 66 BP: (!) 124/54 mmHg Pain: Pain Assessment Pain Assessment: No/denies pain Locomotion : Ambulation Ambulation/Gait Assistance: 5: Supervision   See FIM for current functional status  Therapy/Group: Individual Therapy  [name] 06/21/2014, 12:15 PM

## 2014-06-21 NOTE — Progress Notes (Signed)
Recreational Therapy Session Note  Patient Details  Name: Mary Le MRN: [id number] Date of Birth: 1945-08-25 Today's Date: 06/21/2014  Pain: no c/o Skilled Therapeutic Interventions/Progress Updates: Session focused on planning & preparation for baking task scheduled for tomorrow with Mod I.  Remainder of session focused on problem solving, selective attention, & scanning to the left while working a jig saw puzzle.  Pt required mod verbal cues for task completion.  Therapy/Group: Individual Therapy  Aundrey Elahi 06/21/2014, 4:06 PM

## 2014-06-21 NOTE — Progress Notes (Signed)
Speech Language Pathology Daily Session Note  Patient Details  Name: Mary Le MRN: [id number] Date of Birth: [birth year]  Today's Date: 06/21/2014 SLP Individual Time: 1030-1130 SLP Individual Time Calculation (min): 60 min  Short Term Goals: Week 2: SLP Short Term Goal 1 (Week 2): Pt will consume trials of regular textures with Min A multimodal cues for buccal clearance. SLP Short Term Goal 2 (Week 2): Pt will sustain attention to basic functional tasks for 15 minutes with Min A multimodal cues. SLP Short Term Goal 3 (Week 2): Pt will attend to left field of environment during functional tasks with Min A multimodal cues.  SLP Short Term Goal 4 (Week 2): Pt will demonstrate emergent awareness during functional tasks with Mod A multimodal cues.  SLP Short Term Goal 5 (Week 2): Pt will utilize external memory aids to recall new, daily information with Mod A multimodal cues. SLP Short Term Goal 6 (Week 2): Pt will increase overall intelligibility to 90% at the sentence level with supervision cues for utilization of increased vocal intensity and over articulation.   Skilled Therapeutic Interventions:  Pt was seen for skilled ST targeting cognitive goals.  Pt was received from recreational therapy where she was doing a jig saw puzzle.  SLP continued working towards completing activity as it was personally meaningful for pt and targeted visual scanning to the left and sustained attention.  Pt required overall min-mod assist verbal cues for error awareness and working memory during the abovementioned task.  SLP also provided mod assist verbal cues to locate puzzle pieces on the left.  Pt selectively attended to the puzzle for the duration of today's therapy session (60 min) with intermittent supervision level cues in a moderately distracting environment.  Pt is making good progress towards meeting goals.  Continue per current plan of car.e   FIM:  Comprehension Comprehension Mode:  Auditory Comprehension: 5-Follows basic conversation/direction: With extra time/assistive device Expression Expression Mode: Verbal Expression: 5-Expresses basic 90% of the time/requires cueing < 10% of the time. Social Interaction Social Interaction: 5-Interacts appropriately 90% of the time - Needs monitoring or encouragement for participation or interaction. Problem Solving Problem Solving: 3-Solves basic 50 - 74% of the time/requires cueing 25 - 49% of the time Memory Memory: 4-Recognizes or recalls 75 - 89% of the time/requires cueing 10 - 24% of the time  Pain Pain Assessment Pain Assessment: No/denies pain  Therapy/Group: Individual Therapy  Arinze Rivadeneira, Selinda Orion 06/21/2014, 12:19 PM

## 2014-06-21 NOTE — Progress Notes (Signed)
Occupational Therapy Session Note  Patient Details  Name: Mary Le MRN: [id number] Date of Birth: 19-Feb-1946  Today's Date: 06/21/2014 OT Individual Time: 0902-1002 OT Individual Time Calculation (min): 60 min    Short Term Goals: Week 2:  OT Short Term Goal 1 (Week 2): Pt will perform LB dressing with min assist following hemi techniques. OT Short Term Goal 2 (Week 2): Pt will locate at least 3 items placed left of midline during selfcare tasks with no more than min questioing cueing.  OT Short Term Goal 3 (Week 2): Pt will perform UB dressing to donn pullover shirt with min assist following hemi techniques.  OT Short Term Goal 4 (Week 2): Pt will perform toilet transfers with close supervision during selfcare tasks.  OT Short Term Goal 5 (Week 2): Pt will use the LUE with max assist as a stabilizer during bathing and grooming tasks.   Skilled Therapeutic Interventions/Progress Updates:    Pt performed bathing and dressing during session. Min hand held assist for all functional transfers to the elevated toilet and from the walk-in shower. Mod instructional cueing to sequence bathing as pt demonstrates decreased organization when attempting to wash neglecting the left side at times. Total assist needed to integrate the LUE into washing as pt currently demonstrates Brunnstrum stage I in the hand and stage II in the arm with increased tone noted in the internal rotators, shoulder extensors, and elbow flexors. Pt continues to need max assist for sequencing and following hemi dressing techniques.  Min assist for sit to stand with mod assist for pulling underpants and pants over her left hip.  She was able to do a better job this session at threading her left foot through her clothing but still with moderate difficulty with threading her shirt.    Therapy Documentation Precautions:  Precautions Precautions: Fall Precaution Comments: Lt shoulder subluxation risk, left neglect with right  peripheral gaze Restrictions Weight Bearing Restrictions: No  Pain: Pain Assessment Pain Assessment: No/denies pain ADL: See FIM for current functional status  Therapy/Group: Individual Therapy  Journee Kohen OTR/L 06/21/2014, 12:20 PM

## 2014-06-21 NOTE — Plan of Care (Signed)
Problem: RH BOWEL ELIMINATION Goal: RH STG MANAGE BOWEL WITH ASSISTANCE STG Manage Bowel with min Assistance.  Outcome: Progressing Goal: RH STG MANAGE BOWEL W/MEDICATION W/ASSISTANCE STG Manage Bowel with Medication with min Assistance.  Outcome: Progressing

## 2014-06-21 NOTE — Progress Notes (Signed)
68 y.o. RH female with h/o HTN, morbid obesity, dyslipidemia who was admitted on  06/03/2014 with left-sided weakness. Cranial CT scan negative and patient underwent CTA head/neck which revealed distal right M1 occlusion with question of low density right temporal, parietal lobe and insula.  she underwent endovacular revascularization of R-MCA with thrombectomy by Dr. Estanislado Pandy.  MRI of the brain shows acute infarct right MCA territory in the region of the insula, frontoparietal and high parietal region. Patient intubated thorough 11/15 due to acute respiratory failure.  MRA of the brain with no stenosis or aneurysm and return of flow in R-MCA territory with luxury perfusion.  2D echo showed EF of 65% and no wall motion abnormalities. Carotid Dopplers without significant ICA stenosis. BLE dopplers negative for DVT. TEE with normal LVF, no LAA and negative for PFO. Loop recorder was placed by Dr. [name] on 11/14.Dr. Leonie Man recommended ASA for embolic infarct due to unknown etiology.  Subjective/Complaints: Back pain, improved with Kpad, tramadol Slept well.  . Review of Systems - Negative except as above , denies SOB Objective: Vital Signs: Blood pressure 133/54, pulse 59, temperature 98 F (36.7 C), temperature source Oral, resp. rate 18, height 5\' 7"  (1.702 m), weight 101.334 kg (223 lb 6.4 oz), SpO2 100 %. No results found. No results found for this or any previous visit (from the past 72 hour(s)).   HEENT: normal Cardio: RRR and no murmur Resp: CTA B/L and unlabored GI: BS positive and NT Extremity:  Pulses positive and No Edema Skin:   Intact Neuro: alert/flat, Cranial Nerve Abnormalities Left central VII, Abnormal Sensory absent LT LUE, Abnormal Motor 0/5 LUE, 3- LLE, 5/5 on Right, Tone:  Increased Left biceps 2-3/4 , severe L Inattention, modified Ashworth score 3 at the biceps and pectoralis Musc/Skel:  Other no pain with LUE ROM Gen NAD   Assessment/Plan: 1. Functional deficits  secondary to Right MCA infarct uncertain etiology Requiring comprehensive intensive rehabilitation with 24/7 rehab RN and MD to treat medical comorbidities as well as 3 hr /day PT/OT/SLP FIM: FIM - Bathing Bathing Steps Patient Completed: Chest, Left Arm, Abdomen, Front perineal area, Buttocks, Right upper leg, Left upper leg, Right lower leg (including foot) Bathing: 4: Min-Patient completes 8-9 42f 10 parts or 75+ percent  FIM - Upper Body Dressing/Undressing Upper body dressing/undressing steps patient completed: Put head through opening of pull over shirt/dress, Thread/unthread right sleeve of pullover shirt/dresss Upper body dressing/undressing: 3: Mod-Patient completed 50-74% of tasks FIM - Lower Body Dressing/Undressing Lower body dressing/undressing steps patient completed: Thread/unthread right underwear leg, Thread/unthread right pants leg, Thread/unthread left pants leg, Thread/unthread left underwear leg, Don/Doff right shoe, Don/Doff left shoe Lower body dressing/undressing: 3: Mod-Patient completed 50-74% of tasks  FIM - Toileting Toileting steps completed by patient: Performs perineal hygiene Toileting Assistive Devices: Grab bar or rail for support Toileting: 5: Supervision: Safety issues/verbal cues  FIM - Radio producer Devices: Grab bars Toilet Transfers: 4-From toilet/BSC: Min A (steadying Pt. > 75%), 4-To toilet/BSC: Min A (steadying Pt. > 75%)  FIM - Bed/Chair Transfer Bed/Chair Transfer Assistive Devices: Arm rests (R HHA) Bed/Chair Transfer: 3: Supine > Sit: Mod A (lifting assist/Pt. 50-74%/lift 2 legs, 4: Bed > Chair or W/C: Min A (steadying Pt. > 75%)  FIM - Locomotion: Wheelchair Distance: 45 Locomotion: Wheelchair: 1: Total Assistance/staff pushes wheelchair (Pt<25%) FIM - Locomotion: Ambulation Locomotion: Ambulation Assistive Devices: Other (comment) (none) Ambulation/Gait Assistance: 4: Min assist Locomotion: Ambulation: 1:  Travels less than 50 ft with  minimal assistance (Pt.>75%)  Comprehension Comprehension Mode: Auditory Comprehension: 5-Understands complex 90% of the time/Cues < 10% of the time  Expression Expression Mode: Verbal Expression: 6-Expresses complex ideas: With extra time/assistive device  Social Interaction Social Interaction: 5-Interacts appropriately 90% of the time - Needs monitoring or encouragement for participation or interaction.  Problem Solving Problem Solving: 5-Solves basic 90% of the time/requires cueing < 10% of the time  Memory Memory: 4-Recognizes or recalls 75 - 89% of the time/requires cueing 10 - 24% of the time   Medical Problem List and Plan: 1. Functional deficits secondary to R-MCA infarct, increasing tone cont PT/OT, spasticity add baclofen, not Zanaflex secondary to low BP --on plavix secondary to ASA intolerance 2.  DVT Prophylaxis/Anticoagulation: Pharmaceutical: Lovenox 3. Headaches/ Pain Management:    Changed oxy IR to tramadol. Heat, ROM for right sided muscle soreness 4. Mood: LCSW to follow for evaluation and support.   5. Neuropsych: This patient is capable of making decisions on her own behalf. 6. Skin/Wound Care: Routine pressure relief measures.   7. Fluids/Electrolytes/Nutrition: Monitor I/O especially as on dysphagia diet.   8. HTN: Monitor BP every 8 hours. Continue metoprolol and lisinopril daily. Avoid hypotension---better control.   9. Dyslipidemia: intolerant of statins 10.  Constipation- adjusting Bowel prog 11. Hypertonicity: baclofen 10mg  TID  LOS (Days) 13 A FACE TO FACE EVALUATION WAS PERFORMED  KIRSTEINS,ANDREW E 06/21/2014, 8:26 AM

## 2014-06-21 NOTE — Progress Notes (Signed)
Social Work Patient ID: Mary Le, female   DOB: June 22, 1946, 68 y.o.   MRN: [id number] Met with pt and spoke with daughter regarding scheduling family education tomorrow-10;00-12;00 and 1;00-2;00 with daughter. Will see tomorrow and arrange any needed DME.

## 2014-06-22 ENCOUNTER — Inpatient Hospital Stay (HOSPITAL_COMMUNITY): Payer: Medicare Other | Admitting: *Deleted

## 2014-06-22 ENCOUNTER — Encounter (HOSPITAL_COMMUNITY): Payer: Medicare Other | Admitting: Speech Pathology

## 2014-06-22 ENCOUNTER — Encounter (HOSPITAL_COMMUNITY): Payer: Medicare Other | Admitting: Occupational Therapy

## 2014-06-22 ENCOUNTER — Ambulatory Visit: Payer: Medicare Other

## 2014-06-22 ENCOUNTER — Inpatient Hospital Stay (HOSPITAL_COMMUNITY): Payer: Medicare Other | Admitting: Physical Therapy

## 2014-06-22 LAB — BASIC METABOLIC PANEL
Anion gap: 14 (ref 5–15)
BUN: 11 mg/dL (ref 6–23)
CHLORIDE: 105 meq/L (ref 96–112)
CO2: 22 mEq/L (ref 19–32)
Calcium: 8.8 mg/dL (ref 8.4–10.5)
Creatinine, Ser: 0.7 mg/dL (ref 0.50–1.10)
GFR calc non Af Amer: 88 mL/min — ABNORMAL LOW (ref >90)
GLUCOSE: 103 mg/dL — AB (ref 70–99)
POTASSIUM: 3.9 meq/L (ref 3.7–5.3)
SODIUM: 141 meq/L (ref 137–147)

## 2014-06-22 MED ORDER — BACLOFEN 20 MG PO TABS
20.0000 mg | ORAL_TABLET | Freq: Three times a day (TID) | ORAL | Status: DC
  Administered 2014-06-22 – 2014-06-23 (×3): 20 mg via ORAL
  Filled 2014-06-22 (×6): qty 1

## 2014-06-22 NOTE — Patient Care Conference (Signed)
Inpatient RehabilitationTeam Conference and Plan of Care Update Date: 06/22/2014      Time: 11;15 AM    Patient Name: Mary Le      Medical Record Number: [id number]  Date of Birth: [birth year] Sex: Female         Room/Bed: 4W06C/4W06C-01 Payor Info: Payor: MEDICARE / Plan: MEDICARE PART A AND B / Product Type: *No Product type* /    Admitting Diagnosis: R CVA  Admit Date/Time:  06/08/2014  4:52 PM Admission Comments: No comment available   Primary Diagnosis:  Cerebral infarction due to occlusion of right middle cerebral artery Principal Problem: Cerebral infarction due to occlusion of right middle cerebral artery  Patient Active Problem List   Diagnosis Date Noted  . 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 Left hemiparesis 06/10/2014  . Left-sided neglect 06/10/2014  . 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 Morbid obesity- BMI 40 06/06/2014  . Acute respiratory failure with hypoxia 06/05/2014  . Cerebral infarction due to occlusion of right middle cerebral artery 06/03/2014  . Cerebral infarct 06/03/2014  . Altered mental status 06/03/2014  . GERD (gastroesophageal reflux disease) 04/12/2014  . Benign essential HTN 02/07/2014  . Dyslipidemia- statin intol 02/07/2014  . Chest pain- Low risk Myoview July 2015 02/06/2014    Expected Discharge Date: Expected Discharge Date: 06/23/14  Team Members Present: Physician leading conference: Dr. Alysia Penna Social Worker Present: Ovidio Kin, LCSW Nurse Present: Heather Roberts, RN PT Present: Carney Living, PT OT Present: Benay Pillow, OT SLP Present: Gunnar Fusi, SLP PPS Coordinator present : Daiva Nakayama, RN, CRRN     Current Status/Progress Goal Weekly Team Focus  Medical   CVA, left hemiparesis, left neglect, obesity, left shoulder pain  Maximize functional recovery for home discharge  Treat left shoulder pain probable frozen shoulder    Bowel/Bladder   Pt. continent of bowel and bladder. Pt has Senokot-S 3 tabs BID  manage b/b with minimal assist  Cont. with medication regiemn. LBM 12/1   Swallow/Nutrition/ Hydration   Dys 3 solids and thin liquids, supervision for use of swallowing precautions   supervision   complete family education prior to discharge    ADL's   min guard for toilet transfers and shower transfers, min assist for toileting and LB dressing tasks.  Mod assist for UB dressing with min assist for all bathing.  Still with Brunnstrum stage II trace shoulder movement in the left arm and stage I in the hand.  Left visual inattention as well as decreased visual perceptual processing when attempting to identify parts of her clothing.  min assist to supervision level for bathing, dressing, toileting, and functional transfers  selfcare re-training, functional transfers, pt/family education, neuromuscular re-education, balance re-training   Mobility   supervision overall except mod A bed mobility  supervision overall  L NMR, attending to L, functional mobility, balance, pt/family training   Communication   supervision   supervision   d/c goal    Safety/Cognition/ Behavioral Observations  min assist for basic tasks   min assist   complete family education prior to discharge    Pain   Pt. c/o headache and/or leg pain at times. 1 tab. Ultracet q 4hr PRN for pain  3 or less out of 10  Assess pain q 4hr, medicate as needed and prior to therapy if needed   Skin   No significant skin issues present at this time  remain free of skin breakdown with minimal  assist  assess skin q shift; turn pt. q 2hrs at HS      *See Care Plan and progress notes for long and short-term goals.  Barriers to Discharge: Physical assistance as well as pain issues    Possible Resolutions to Barriers:  Caregiver training    Discharge Planning/Teaching Needs:  Family education today with daughter to prepare for discharge tomorrow.       Team Discussion:  Family education with daughter and granddaughter today, reached her goals and medically stable for discharge tomorrow.  Tone in shoulder-meds for which help.    Revisions to Treatment Plan:  Upgraded diet to Dys 3 thin liquids   Continued Need for Acute Rehabilitation Level of Care: The patient requires daily medical management by a physician with specialized training in physical medicine and rehabilitation for the following conditions: Daily direction of a multidisciplinary physical rehabilitation program to ensure safe treatment while eliciting the highest outcome that is of practical value to the patient.: Yes Daily medical management of patient stability for increased activity during participation in an intensive rehabilitation regime.: Yes Daily analysis of laboratory values and/or radiology reports with any subsequent need for medication adjustment of medical intervention for : Neurological problems;Other  [name] 06/23/2014, 8:41 AM

## 2014-06-22 NOTE — Progress Notes (Signed)
Social Work Patient ID: Mary Le, female   DOB: 05-Nov-1945, 68 y.o.   MRN: [id number] Met with pt and daughter who was here for family education, both feel it is going well.  Will begin with home health follow up and transition to OP therapies. Awaiting therapists equipment needs.  Preparing for discharge tomorrow.

## 2014-06-22 NOTE — Progress Notes (Signed)
Occupational Therapy Session Note  Patient Details  Name: Mary Le MRN: [id number] Date of Birth: [birth year]  Today's Date: 06/22/2014 OT Individual Time: 7867-5449 OT Individual Time Calculation (min): 71 min    Short Term Goals: Week 2:  OT Short Term Goal 1 (Week 2): Pt will perform LB dressing with min assist following hemi techniques. OT Short Term Goal 2 (Week 2): Pt will locate at least 3 items placed left of midline during selfcare tasks with no more than min questioing cueing.  OT Short Term Goal 3 (Week 2): Pt will perform UB dressing to donn pullover shirt with min assist following hemi techniques.  OT Short Term Goal 4 (Week 2): Pt will perform toilet transfers with close supervision during selfcare tasks.  OT Short Term Goal 5 (Week 2): Pt will use the LUE with max assist as a stabilizer during bathing and grooming tasks.   Skilled Therapeutic Interventions/Progress Updates:    Pt's family (daughter and granddaughter) present for family education.  Pt performed shower and dressing sit to stand during session.  Min guard assist for shower transfers to the walk-in shower.  She was able to perform overall bathing with min assist and mod instructional cueing.  Total assist for use of the LUE hand over hand for washing the left upper leg and the right arm.  Pt with increased anterior shoulder pain in the left arm when attempting to reach across and wash the right arm.  Min guard assist for sit to stand as well when washing peri area.  She required min assist for washing the left buttocks in standing.  Pt transferred back out to the wheelchair in front of the sink with min guard assist.  Worked on dressing with mod assist for UB to donn pullover shirt and min assist to donn LB clothing and slip on shoes.  Pt still needs max demonstrational cueing for following hemi techniques to perform LB dressing including crossing the LLE over the right knee to start her underpants and shoe.  Pt still  does not visually compensate in order to locate and orient her clothing before attempting to apply.  Discussed with pt's daughter and granddaughter techniques for safe assist with all selfcare tasks throughout session as well as the need for a rubber mat in the shower.  Pt will also need wide drop arm commode for use over the toilet.  Discussed continuing to perform daily AAROM exercises for the left arm as tone is continuing to increase as well as making sure the UE is positioned on pillows out beside of her.    Therapy Documentation Precautions:  Precautions Precautions: Fall Precaution Comments: Lt shoulder subluxation risk, left neglect with right peripheral gaze Restrictions Weight Bearing Restrictions: No  Pain: Pain Assessment Pain Assessment: 0-10 Pain Score: 4  Pain Type: Acute pain Pain Location: Arm Pain Orientation: Left Pain Descriptors / Indicators: Aching;Sharp Pain Frequency: Intermittent Pain Onset: Gradual Patients Stated Pain Goal: 2 Pain Intervention(s): Medication (See eMAR);Repositioned ADL: See FIM for current functional status  Therapy/Group: Individual Therapy  Yurianna Tusing OTR/L 06/22/2014, 12:03 PM

## 2014-06-22 NOTE — Plan of Care (Signed)
Problem: RH Stairs Goal: LTG Patient will ambulate up and down stairs w/assist (PT) LTG: Patient will ambulate up and down # of stairs with assistance (PT)  Goal changed to more accurately reflect patient's home environment.

## 2014-06-22 NOTE — Progress Notes (Signed)
Physical Therapy Discharge Summary  Patient Details  Name: Mary Le MRN: [id number] Date of Birth: 1946/01/19  Today's Date: 06/22/2014  Patient has met 7 of 8 long term goals due to improved activity tolerance, improved balance, improved postural control, increased strength, ability to compensate for deficits, functional use of  left lower extremity, improved attention and improved awareness.  Patient to discharge at an ambulatory level Supervision.   Patient's care partner is independent to provide the necessary physical and cognitive assistance at discharge.  Reasons goals not met: Patient continues to require up to mod A for bed mobility due to decreased recall/carryover of sequencing and technique and rolling limited by body habitus.   Recommendation:  Patient will benefit from ongoing skilled PT services in outpatient setting to continue to advance safe functional mobility, address ongoing impairments in L UE > LE hemiplegia, balance, muscle weakness, decreased endurance, impaired timing and sequencing, abnormal tone, unbalanced muscle activation, decreased midline orientation, left side neglect and decreased attention, decreased awareness, decreased safety awareness, and minimize fall risk.  Equipment: wheelchair with basic back rest  Reasons for discharge: treatment goals met and discharge from hospital  Patient/family agrees with progress made and goals achieved: Yes  PT Discharge Precautions/Restrictions Precautions Precautions: Fall Precaution Comments: Lt shoulder subluxation risk, left neglect with right peripheral gaze Restrictions Weight Bearing Restrictions: No Pain Pain Assessment Pain Assessment: No/denies pain Pain Score: 4  Pain Type: Acute pain Pain Location: Arm Pain Orientation: Left Pain Descriptors / Indicators: Aching;Sharp Pain Frequency: Intermittent Pain Onset: Gradual Patients Stated Pain Goal: 2 Pain Intervention(s): Medication (See  eMAR);Repositioned Vision/Perception   Defer to OT discharge summary  Cognition Defer to SLP discharge summary Sensation Sensation Light Touch Impaired Details: Absent LUE;Impaired LLE Stereognosis: Not tested Hot/Cold: Not tested Proprioception: Impaired Detail Proprioception Impaired Details: Absent LUE;Impaired LLE Additional Comments: Pt unable to detect any light touch throughout the UE and could not determine proprioceptive input to the shoulder or arm during session. Coordination Gross Motor Movements are Fluid and Coordinated: No Fine Motor Movements are Fluid and Coordinated: No Motor  Motor Motor: Hemiplegia;Abnormal postural alignment and control Motor - Discharge Observations: moderate LUE hemiplegia and mild LLE hemiplegia  Mobility Bed Mobility Bed Mobility: Supine to Sit;Sit to Supine Supine to Sit: HOB flat;3: Mod assist Supine to Sit Details: Verbal cues for technique;Verbal cues for sequencing;Manual facilitation for weight shifting Supine to Sit Details (indicate cue type and reason): mod A to roll to R, supervision to push up with LUE sidelying > sit Sit to Supine: HOB flat;3: Mod assist Sit to Supine - Details: Verbal cues for technique;Verbal cues for sequencing Sit to Supine - Details (indicate cue type and reason): mod A for BLE management Transfers Transfers: Yes Sit to Stand: 5: Supervision;From chair/3-in-1;From bed Stand to Sit: 5: Supervision;To chair/3-in-1;To bed;To toilet Stand to Sit Details (indicate cue type and reason): Verbal cues for technique;Verbal cues for precautions/safety Stand to Sit Details: pt requires verbal cues to back up to surface with LLE as well as RLE to prevent sitting sideways on surface Locomotion  Ambulation Ambulation: Yes Ambulation/Gait Assistance: 5: Supervision Ambulation Distance (Feet): 150 Feet Assistive device: None Ambulation/Gait Assistance Details: Verbal cues for precautions/safety;Verbal cues for gait  pattern Ambulation/Gait Assistance Details: verbal cues for L heel strike and decreased R step length Gait Gait: Yes Gait Pattern: Impaired Gait Pattern: Shuffle;Step-through pattern;Decreased weight shift to right;Decreased step length - left;Decreased trunk rotation;Trunk flexed Stairs / Additional Locomotion Stairs: Yes Stairs Assistance: 5: Supervision Stairs  Assistance Details: Verbal cues for sequencing;Verbal cues for precautions/safety Stair Management Technique: One rail Right;Step to pattern;Forwards Number of Stairs: 5 Height of Stairs: 6 Wheelchair Mobility Wheelchair Mobility: Yes Wheelchair Assistance: 5: Supervision Wheelchair Assistance Details: Verbal cues for technique;Verbal cues for sequencing;Visual cues/gestures for sequencing Wheelchair Propulsion: Right upper extremity;Both lower extermities Wheelchair Parts Management: Needs assistance Distance: 150  Trunk/Postural Assessment  Cervical Assessment Cervical Assessment: Within Functional Limits Thoracic Assessment Thoracic Assessment: Within Functional Limits Lumbar Assessment Lumbar Assessment: Within Functional Limits Postural Control Postural Control: Deficits on evaluation Postural Limitations: Pt with increased left trunk elongation and increased weightshift to the left in standing. Limited with standing tasks by chronic back pain.   Balance Balance Balance Assessed: Yes Standardized Balance Assessment Standardized Balance Assessment: Berg Balance Test Berg Balance Test Sit to Stand: Able to stand  independently using hands Standing Unsupported: Able to stand safely 2 minutes Sitting with Back Unsupported but Feet Supported on Floor or Stool: Able to sit safely and securely 2 minutes Stand to Sit: Sits safely with minimal use of hands Transfers: Able to transfer safely, definite need of hands Standing Unsupported with Eyes Closed: Able to stand 10 seconds safely Standing Ubsupported with Feet  Together: Able to place feet together independently and stand for 1 minute with supervision From Standing, Reach Forward with Outstretched Arm: Reaches forward but needs supervision From Standing Position, Pick up Object from Floor: Able to pick up shoe safely and easily From Standing Position, Turn to Look Behind Over each Shoulder: Looks behind from both sides and weight shifts well Turn 360 Degrees: Needs close supervision or verbal cueing Standing Unsupported, Alternately Place Feet on Step/Stool: Able to complete >2 steps/needs minimal assist Standing Unsupported, One Foot in Front: Able to plae foot ahead of the other independently and hold 30 seconds Standing on One Leg: Tries to lift leg/unable to hold 3 seconds but remains standing independently Total Score: 40/56 Extremity Assessment  RLE Assessment RLE Assessment: Within Functional Limits LLE Assessment LLE Assessment: Exceptions to [facility] LLE Strength LLE Overall Strength: Deficits LLE Overall Strength Comments: Grossly 4-/5 to 4+/5 throughout  See FIM for current functional status  [name] 06/22/2014, 2:11 PM

## 2014-06-22 NOTE — Progress Notes (Signed)
68 y.o. RH female with h/o HTN, morbid obesity, dyslipidemia who was admitted on  06/03/2014 with left-sided weakness. Cranial CT scan negative and patient underwent CTA head/neck which revealed distal right M1 occlusion with question of low density right temporal, parietal lobe and insula.  she underwent endovacular revascularization of R-MCA with thrombectomy by Dr. Estanislado Pandy.  MRI of the brain shows acute infarct right MCA territory in the region of the insula, frontoparietal and high parietal region. Patient intubated thorough 11/15 due to acute respiratory failure.  MRA of the brain with no stenosis or aneurysm and return of flow in R-MCA territory with luxury perfusion.  2D echo showed EF of 65% and no wall motion abnormalities. Carotid Dopplers without significant ICA stenosis. BLE dopplers negative for DVT. TEE with normal LVF, no LAA and negative for PFO. Loop recorder was placed by Dr. [name] on 11/14.Dr. Leonie Man recommended ASA for embolic infarct due to unknown etiology.  Subjective/Complaints: No back pain c/os Some shoulder pain on left . Review of Systems - Negative except as above , denies SOB Objective: Vital Signs: Blood pressure 129/48, pulse 65, temperature 98.5 F (36.9 C), temperature source Oral, resp. rate 18, height '5\' 7"'  (1.702 m), weight 101.334 kg (223 lb 6.4 oz), SpO2 98 %. No results found. Results for orders placed or performed during the hospital encounter of 06/08/14 (from the past 72 hour(s))  Basic metabolic panel     Status: Abnormal   Collection Time: 06/22/14  4:36 AM  Result Value Ref Range   Sodium 141 137 - 147 mEq/L   Potassium 3.9 3.7 - 5.3 mEq/L   Chloride 105 96 - 112 mEq/L   CO2 22 19 - 32 mEq/L   Glucose, Bld 103 (H) 70 - 99 mg/dL   BUN 11 6 - 23 mg/dL   Creatinine, Ser 0.70 0.50 - 1.10 mg/dL   Calcium 8.8 8.4 - 10.5 mg/dL   GFR calc non Af Amer 88 (L) >90 mL/min   GFR calc Af Amer >90 >90 mL/min    Comment: (NOTE) The eGFR has been calculated  using the CKD EPI equation. This calculation has not been validated in all clinical situations. eGFR's persistently <90 mL/min signify possible Chronic Kidney Disease.    Anion gap 14 5 - 15     HEENT: normal Cardio: RRR and no murmur Resp: CTA B/L and unlabored GI: BS positive and NT Extremity:  Pulses positive and No Edema Skin:   Intact Neuro: alert/flat, Cranial Nerve Abnormalities Left central VII, Abnormal Sensory absent LT LUE, Abnormal Motor 0/5 LUE, 3- LLE, 5/5 on Right, Tone:  Increased Left biceps 2-3/4 , severe L Inattention, modified Ashworth score 3 at the biceps and pectoralis Musc/Skel:  Pain with Left arm ext rotation Gen NAD   Assessment/Plan: 1. Functional deficits secondary to Right MCA infarct uncertain etiology Requiring comprehensive intensive rehabilitation with 24/7 rehab RN and MD to treat medical comorbidities as well as 3 hr /day PT/OT/SLP Team conference today please see physician documentation under team conference tab, met with team face-to-face to discuss problems,progress, and goals. Formulized individual treatment plan based on medical history, underlying problem and comorbidities. FIM: FIM - Bathing Bathing Steps Patient Completed: Chest, Left Arm, Abdomen, Front perineal area, Buttocks, Right upper leg, Left upper leg, Right lower leg (including foot) Bathing: 4: Min-Patient completes 8-9 29f10 parts or 75+ percent  FIM - Upper Body Dressing/Undressing Upper body dressing/undressing steps patient completed: Put head through opening of pull over shirt/dress, Thread/unthread right  sleeve of pullover shirt/dresss Upper body dressing/undressing: 3: Mod-Patient completed 50-74% of tasks FIM - Lower Body Dressing/Undressing Lower body dressing/undressing steps patient completed: Thread/unthread right underwear leg, Thread/unthread right pants leg, Thread/unthread left pants leg, Thread/unthread left underwear leg, Don/Doff right shoe, Don/Doff left  shoe Lower body dressing/undressing: 3: Mod-Patient completed 50-74% of tasks  FIM - Toileting Toileting steps completed by patient: Adjust clothing prior to toileting, Performs perineal hygiene, Adjust clothing after toileting Toileting Assistive Devices: Grab bar or rail for support Toileting: 5: Supervision: Safety issues/verbal cues  FIM - Radio producer Devices: Product manager Transfers: 5-To toilet/BSC: Supervision (verbal cues/safety issues)  FIM - Control and instrumentation engineer Devices: Arm rests (R HHA) Bed/Chair Transfer: 5: Chair or W/C > Bed: Supervision (verbal cues/safety issues), 4: Bed > Chair or W/C: Min A (steadying Pt. > 75%)  FIM - Locomotion: Wheelchair Distance: 45 Locomotion: Wheelchair: 1: Total Assistance/staff pushes wheelchair (Pt<25%) FIM - Locomotion: Ambulation Locomotion: Ambulation Assistive Devices: Other (comment) (none) Ambulation/Gait Assistance: 5: Supervision Locomotion: Ambulation: 2: Travels 50 - 149 ft with supervision/safety issues  Comprehension Comprehension Mode: Auditory Comprehension: 5-Follows basic conversation/direction: With no assist  Expression Expression Mode: Verbal Expression: 5-Expresses basic needs/ideas: With no assist  Social Interaction Social Interaction: 5-Interacts appropriately 90% of the time - Needs monitoring or encouragement for participation or interaction.  Problem Solving Problem Solving: 5-Solves basic problems: With no assist  Memory Memory: 6-More than reasonable amt of time   Medical Problem List and Plan: 1. Functional deficits secondary to R-MCA infarct, increasing tone cont PT/OT, spasticity improved on baclofen, --on plavix secondary to ASA intolerance 2.  DVT Prophylaxis/Anticoagulation: Pharmaceutical: Lovenox 3. Headaches/ Pain Management:    Changed oxy IR to tramadol. Heat, ROM for right sided muscle soreness 4. Mood: LCSW to follow for  evaluation and support.   5. Neuropsych: This patient is capable of making decisions on her own behalf. 6. Skin/Wound Care: Routine pressure relief measures.   7. Fluids/Electrolytes/Nutrition: Monitor I/O especially as on dysphagia diet.   8. HTN: Monitor BP every 8 hours. Continue metoprolol and lisinopril daily. Avoid hypotension---better control.   9. Dyslipidemia: intolerant of statins 10.  Constipation- adjusting Bowel prog 11. Hypertonicity: baclofen 110m TID  LOS (Days) 14 A FACE TO FACE EVALUATION WAS PERFORMED  Mary Le 06/22/2014, 9:24 AM

## 2014-06-22 NOTE — Plan of Care (Signed)
Problem: RH SAFETY Goal: RH STG DECREASED RISK OF FALL WITH ASSISTANCE STG Decreased Risk of Fall With mod Assistance.  Outcome: Progressing

## 2014-06-22 NOTE — Plan of Care (Signed)
Problem: RH Swallowing Goal: LTG Patient will consume least restrictive PO diet (SLP) LTG: Patient will consume least restrictive PO diet with assist for use of compensatory strategies (SLP)  Outcome: Completed/Met Date Met:  06/22/14 Goal: LTG Patient will participate in dysphagia therapy (SLP) LTG: Patient will participate in dysphagia therapy with assist to increase swallow function as evidenced by bedside or objective clinical assessment (SLP)  Outcome: Completed/Met Date Met:  06/22/14 Goal: LTG Patient will demonstrate a functional change in (SLP) LTG: Patient will demonstrate a functional change in oral/oropharyngeal swallow as evidenced by an objective assessment (SLP)  Outcome: Completed/Met Date Met:  06/22/14  Problem: RH Expression Communication Goal: LTG Patient will increase speech intelligibility (SLP) LTG: Patient will increase speech intelligibility at word/phrase/conversation level with cues, % of the time (SLP)  Outcome: Completed/Met Date Met:  06/22/14  Problem: RH Problem Solving Goal: LTG Patient will demonstrate problem solving for (SLP) LTG: Patient will demonstrate problem solving for basic/complex daily situations with cues (SLP)  Outcome: Completed/Met Date Met:  06/22/14  Problem: RH Memory Goal: LTG Patient will use memory compensatory aids to (SLP) LTG: Patient will use memory compensatory aids to recall biographical/new, daily complex information with cues (SLP)  Outcome: Completed/Met Date Met:  06/22/14  Problem: RH Attention Goal: LTG Patient will demonstrate focused/sustained (SLP) LTG: Patient will demonstrate focused/sustained/selective/alternating/divided attention during cognitive/linguistic activities in specific environment with assist for # of minutes (SLP)  Outcome: Completed/Met Date Met:  06/22/14  Problem: RH Awareness Goal: LTG: Patient will demonstrate intellectual/emergent (SLP) LTG: Patient will demonstrate  intellectual/emergent/anticipatory awareness with assist during a cognitive/linguistic activity (SLP)  Outcome: Completed/Met Date Met:  06/22/14  Problem: RH Vision Goal: RH LTG Vision (Specify) Outcome: Completed/Met Date Met:  06/22/14

## 2014-06-22 NOTE — Discharge Instructions (Signed)
Inpatient Rehab Discharge Instructions  Ileta Matney Discharge date and time:  06/23/14  Activities/Precautions/ Functional Status: Activity: activity as tolerated No driving Diet: cardiac diet Wound Care: none needed   Functional status:  ___ No restrictions     ___ Walk up steps independently _X__ 24/7 supervision/assistance   ___ Walk up steps with assistance ___ Intermittent supervision/assistance  ___ Bathe/dress independently ___ Walk with walker     _X__ Bathe/dress with assistance ___ Walk Independently    ___ Shower independently ___ Walk with assistance    ___ Shower with assistance _X__ No alcohol     ___ Return to work/school ________  Special Instructions:    COMMUNITY REFERRALS UPON DISCHARGE:    Home Health:   PT, OT, SP, [place] QQIWL:[phone] Date of last service:06/23/2014   Medical Equipment/Items Ordered:DROP-ARM [place]     [phone]   GENERAL COMMUNITY RESOURCES FOR PATIENT/FAMILY: Support Groups:[facility] SUPPORT GROUP   STROKE/TIA DISCHARGE INSTRUCTIONS SMOKING Cigarette smoking nearly doubles your risk of having a stroke & is the single most alterable risk factor  If you smoke or have smoked in the last 12 months, you are advised to quit smoking for your health.  Most of the excess cardiovascular risk related to smoking disappears within a year of stopping.  Ask you doctor about anti-smoking medications  [place] Quit Line: 1-800-QUIT NOW  Free Smoking Cessation Classes [phone]  CHOLESTEROL Know your levels; limit fat & cholesterol in your diet  Lipid Panel     Component Value Date/Time   CHOL 219* 06/04/2014 0510   TRIG 163* 06/04/2014 0510   HDL 36* 06/04/2014 0510   CHOLHDL 6.1 06/04/2014 0510   VLDL 33 06/04/2014 0510   LDLCALC 150* 06/04/2014 0510      Many patients benefit from treatment even if their cholesterol is at goal.  Goal: Total Cholesterol (CHOL)  less than 160  Goal:  Triglycerides (TRIG) less than 150  Goal:  HDL greater than 40  Goal:  LDL (LDLCALC) less than 100   BLOOD PRESSURE American Stroke Association blood pressure target is less that 120/80 mm/Hg  Your discharge blood pressure is:  BP: (!) 124/54 mmHg  Monitor your blood pressure  Limit your salt and alcohol intake  Many individuals will require more than one medication for high blood pressure  DIABETES (A1c is a blood sugar average for last 3 months) Goal HGBA1c is under 7% (HBGA1c is blood sugar average for last 3 months)  Diabetes: No known diagnosis of diabetes    Lab Results  Component Value Date   HGBA1C 6.2* 06/04/2014     Your HGBA1c can be lowered with medications, healthy diet, and exercise.  Check your blood sugar as directed by your physician  Call your physician if you experience unexplained or low blood sugars.  PHYSICAL ACTIVITY/REHABILITATION Goal is 30 minutes at least 4 days per week  Activity: No driving, Therapies: See above Return to work:  N/A  Activity decreases your risk of heart attack and stroke and makes your heart stronger.  It helps control your weight and blood pressure; helps you relax and can improve your mood.  Participate in a regular exercise program.  Talk with your doctor about the best form of exercise for you (dancing, walking, swimming, cycling).  DIET/WEIGHT Goal is to maintain a healthy weight  Your discharge diet is: DIET DYS 3 thin liquids Your height is:  Height: 5\' 7"  (170.2 cm)  Your current weight is: Weight: 101.334 kg (223 lb 6.4 oz) Your Body Mass Index (BMI) is:  BMI (Calculated): 35.1  Following the type of diet specifically designed for you will help prevent another stroke.  Your goal weight  is: 159 lbs  Your goal Body Mass Index (BMI) is 19-24.  Healthy food habits can help reduce 3 risk factors for stroke:  High cholesterol, hypertension, and excess weight.  RESOURCES Stroke/Support Group:   Call [phone]   STROKE EDUCATION PROVIDED/REVIEWED AND GIVEN TO PATIENT Stroke warning signs and symptoms How to activate emergency medical system (call 911). Medications prescribed at discharge. Need for follow-up after discharge. Personal risk factors for stroke. Pneumonia vaccine given:  Flu vaccine given:  My questions have been answered, the writing is legible, and I understand these instructions.  I will adhere to these goals & educational materials that have been provided to me after my discharge from the hospital.     My questions have been answered and I understand these instructions. I will adhere to these goals and the provided educational materials after my discharge from the hospital.  Patient/Caregiver Signature _______________________________ Date __________  Clinician Signature _______________________________________ Date __________  Please bring this form and your medication list with you to all your follow-up doctor's appointments.

## 2014-06-22 NOTE — Plan of Care (Signed)
Problem: RH SAFETY Goal: RH STG ADHERE TO SAFETY PRECAUTIONS W/ASSISTANCE/DEVICE STG Adhere to Safety Precautions With mod. Assistance/Device.  Outcome: Progressing     

## 2014-06-22 NOTE — Progress Notes (Signed)
Physical Therapy Session Note  Patient Details  Name: Mary Le MRN: [id number] Date of Birth: 04/08/1946  Today's Date: 06/22/2014 PT Individual Time: 0830-0905 and 1300-1345 PT Individual Time Calculation (min): 35 min and 45 min  Short Term Goals: Week 1:  PT Short Term Goal 1 (Week 1): = LTGs of overall supervision  Skilled Therapeutic Interventions/Progress Updates:   Session 1: Pt received semi reclined in bed, RN departing. Pt transferred to EOB with HOB elevated with mod A. Pt ambulated to bathroom with close supervision and verbal cues for L foot clearance. Pt continues to require cues for safety to back up with LLE as well as RLE to surfaces including toilet and chair before sitting as she tends to sit sideways with R side only. Pt requires assist for clothing management and performed hygiene with supervision. Berg Balance Scale initiated but unable to complete last 3 items due to patient c/o "feeling hot." Pt returned to w/c with cushion removed and propelled w/c using BLE and RUE x 150 ft with supervision and total cues for sequencing and attending to LLE. Pt left sitting in w/c with all needs within reach.   Session 2: Focus on family training with daughter and granddaughter in preparation for discharge. Patient received semi reclined in bed, reporting need for bathroom. Pt transferred supine > sit with HOB flat and no rail with mod A for rolling to R and supervision for sidelying > sit with cues for pushing up with R hand. Pt ambulated to bathroom daughter providing close supervision. After washing hands, gait training without AD x 150 ft with daughter providing close supervision and verbal cues for L heel strike. Pt negotiated up/down 5 stairs using 1 rail forwards and up/down two 7" stairs x 3 using R rail ascending forwards and descending laterally with RUE support on rail, close supervision by daughter. Pt requires cues for safe foot placement and technique. Pt completed Berg Balance  Scale with score of 40/56 improved from 24/56 on eval, see discharge summary for details. Pt and family educated on results and that patient remains at risk for falls, all verbalized understanding. Pt simulated stepping over walk-in shower threshold x 3 with daughter providing close supervision. Pt performed car transfer to sedan height with supervision for getting in/out of car and total cues for sequencing and min-mod A for scooting on seat for upright posture after car transfer due to high floorboard height of simulated car. Anticipate patient will not have difficulty with actual car transfer at supervision level. Patient's family providing safe and appropriate supervision/assist and cues for safety and attending to L. Patient's family instructed in w/c and parts management, daughter instructed not to lift w/c in/out of car due to port s/p breast cancer, verbalized understanding. Discussed f/u therapy and provided family with ideas for HEP with focus on LLE NMR. Patient requesting to use bathroom again, returned to room and assisted to bathroom by granddaughter. Pt left sitting on commode with family cleared to assist patient in room. Patient and family with no further questions regarding discharge.   Therapy Documentation Precautions:  Precautions Precautions: Fall Precaution Comments: Lt shoulder subluxation risk, left neglect with right peripheral gaze Restrictions Weight Bearing Restrictions: No Pain: Pain Assessment Pain Assessment: 0-10 Pain Score: 4  Pain Type: Acute pain Pain Location: Arm Pain Orientation: Left Pain Descriptors / Indicators: Aching;Sharp Pain Frequency: Intermittent Pain Onset: Gradual Patients Stated Pain Goal: 2 Pain Intervention(s): Medication (See eMAR);Repositioned  See FIM for current functional status  Therapy/Group: Individual  Therapy  Carney Living A 06/22/2014, 11:22 AM

## 2014-06-22 NOTE — Plan of Care (Signed)
Problem: RH Balance Goal: LTG: Patient will maintain dynamic sitting balance (OT) LTG: Patient will maintain dynamic sitting balance with assistance during activities of daily living (OT)  Outcome: Completed/Met Date Met:  06/22/14 Goal: LTG Patient will maintain dynamic standing with ADLs (OT) LTG: Patient will maintain dynamic standing balance with assist during activities of daily living (OT)  Outcome: Completed/Met Date Met:  06/22/14  Problem: RH Eating Goal: LTG Patient will perform eating w/assist, cues/equip (OT) LTG: Patient will perform eating with assist, with/without cues using equipment (OT)  Outcome: Completed/Met Date Met:  06/22/14  Problem: RH Grooming Goal: LTG Patient will perform grooming w/assist,cues/equip (OT) LTG: Patient will perform grooming with assist, with/without cues using equipment (OT)  Outcome: Completed/Met Date Met:  06/22/14  Problem: RH Bathing Goal: LTG Patient will bathe with assist, cues/equipment (OT) LTG: Patient will bathe specified number of body parts with assist with/without cues using equipment (position) (OT)  Outcome: Completed/Met Date Met:  06/22/14  Problem: RH Dressing Goal: LTG Patient will perform upper body dressing (OT) LTG Patient will perform upper body dressing with assist, with/without cues (OT).  Outcome: Not Met (add Reason) Goal: LTG Patient will perform lower body dressing w/assist (OT) LTG: Patient will perform lower body dressing with assist, with/without cues in positioning using equipment (OT)  Outcome: Completed/Met Date Met:  06/22/14  Problem: RH Toileting Goal: LTG Patient will perform toileting w/assist, cues/equip (OT) LTG: Patient will perform toiletiing (clothes management/hygiene) with assist, with/without cues using equipment (OT)  Outcome: Completed/Met Date Met:  06/22/14  Problem: RH Functional Use of Upper Extremity Goal: LTG Patient will use RT/LT upper extremity as a (OT) LTG: Patient will use  right/left upper extremity as a stabilizer/gross assist/diminished/nondominant/dominant level with assist, with/without cues during functional activity (OT)  Outcome: Completed/Met Date Met:  06/22/14  Problem: RH Toilet Transfers Goal: LTG Patient will perform toilet transfers w/assist (OT) LTG: Patient will perform toilet transfers with assist, with/without cues using equipment (OT)  Outcome: Completed/Met Date Met:  06/22/14  Problem: RH Tub/Shower Transfers Goal: LTG Patient will perform tub/shower transfers w/assist (OT) LTG: Patient will perform tub/shower transfers with assist, with/without cues using equipment (OT)  Outcome: Completed/Met Date Met:  06/22/14  Problem: RH Memory Goal: LTG Patient will demonstrate ability for day to day (OT) LTG: Patient will demonstrate ability for day to day recall/carryover during activities of daily living with assist (OT)  Outcome: Not Met (add Reason)  Problem: RH Awareness Goal: LTG: Patient will demonstrate intellectual/emergent (OT) LTG: Patient will demonstrate intellectual/emergent/anticipatory awareness with assist during a functional activity (OT)  Outcome: Not Met (add Reason)

## 2014-06-22 NOTE — Progress Notes (Signed)
Speech Language Pathology Discharge Summary  Patient Details  Name: Mary Le MRN: [id number] Date of Birth: 1945/12/03  Today's Date: 06/22/2014 SLP Individual Time: 1116-1203 SLP Individual Time Calculation (min): 47 min   Skilled Therapeutic Interventions:  Pt was seen for skilled ST targeting family education and dysphagia goals.  Upon arrival, pt was sitting upright in wheelchair with daughter and granddaughter present.  SLP provided skilled education related to dys 3 consistencies with handout to facilitate carryover in the home environment.  SLP recommended that pt be advanced to regular solids as tolerated and recommended full supervision during trials of advanced consistencies to monitor for left pocketing, anterior loss of materials,and s/s of aspiration.  SLP completed skilled observations during presentations of pt's currently prescribed diet and pt exhibited mild left pocketing of dys 3 solids, for which her daughter effectively provided supervision level cues to correct. SLP also provided skilled education related to compensatory strategies for cognition with specific emphasis placed on memory, left attention, and problem solving.  SLP recommended various cognitive reorganization activities to facilitate carryover of cognitive skills in between therapy sessions, including pt's previous interests of playing scrabble as well as doing puzzles and word searches.  Pt was able to identify how her decreased attention to the left will impact her functional safety at home with tasks she was able to do previously such as cooking, cleaning, and driving with supervision question cues.  SLP also strongly recommended assistance for medication and financial management at discharge, as well as 24/7 supervision for safety due to left inattention.  All pt's and pt's family's questions were answered to their satisfaction at this time.  Pt is on track for discharge tomorrow.       Patient has met 9 of 9 long  term goals.  Patient to discharge at overall Supervision;Min level.  Reasons goals not met: n/a   Clinical Impression/Discharge Summary:  Pt made functional gains while inpatient and is discharging having met  9 out of 9 long term goals due to improved visual scanning to the left, attention, functional problem solving, emergent awareness of physical and cognitive deficits, speech intelligibility, and recall of daily information.  Currently, pt requires min assist-supervision for basic cognitive-linguistic tasks and is utilizing her recommended swallowing precautions with supervision to minimize overt s/s of aspiration with dys 3 solids and thin liquids.  Pt may be advanced slowly and carefully as tolerated to regular solids.  Pt is discharging home with 24/7 supervision and assistance for medication and financial management from family.  Pt would also benefit from skilled ST follow up at discharge, either in the outpatient or home health setting order to maximize functional independence and reduce burden of care.    Care Partner:  Caregiver Able to Provide Assistance: Yes  Type of Caregiver Assistance: Physical;Cognitive  Recommendation:  Outpatient SLP;Home Health SLP;24 hour supervision/assistance  Rationale for SLP Follow Up: Maximize functional communication;Maximize swallowing safety;Maximize cognitive function and independence;Reduce caregiver burden   Equipment: none recommended by SLP   Reasons for discharge: Discharged from hospital   Patient/Family Agrees with Progress Made and Goals Achieved: Yes   See FIM for current functional status  Windell Moulding, M.A. CCC-SLP  Anatalia Kronk, Selinda Orion 06/22/2014, 3:15 PM

## 2014-06-22 NOTE — Plan of Care (Signed)
Problem: RH SAFETY Goal: RH STG ADHERE TO SAFETY PRECAUTIONS W/ASSISTANCE/DEVICE STG Adhere to Safety Precautions With mod Assistance/Device.  Outcome: Completed/Met Date Met:  06/22/14 Goal: RH STG DECREASED RISK OF FALL WITH ASSISTANCE STG Decreased Risk of Fall With mod Assistance.  Outcome: Completed/Met Date Met:  06/22/14  Problem: RH BOWEL ELIMINATION Goal: RH STG MANAGE BOWEL WITH ASSISTANCE STG Manage Bowel with min Assistance.  Outcome: Completed/Met Date Met:  06/22/14 Goal: RH STG MANAGE BOWEL W/MEDICATION W/ASSISTANCE STG Manage Bowel with Medication with min Assistance.  Outcome: Completed/Met Date Met:  06/22/14

## 2014-06-22 NOTE — Progress Notes (Signed)
Occupational Therapy Discharge Summary  Patient Details  Name: Mary Le MRN: [id number] Date of Birth: [birth year]   Patient has met 51 of 15 long term goals due to improved balance, postural control, ability to compensate for deficits, functional use of  LEFT upper and LEFT lower extremity, improved attention and improved awareness.  Patient to discharge at [facility] Assist level.  Patient's care partner is independent to provide the necessary physical and cognitive assistance at discharge.    Reasons goals not met: Pt needs mod assist for UB dressing tasks as well as min to mod assist for anticipatory awareness of deficits and for carryover of memory from one session to the other.  Pt needs continued min assist for selfcare tasks and close supervision for functional transfers.  She continues to demonstrate moderate hemiparesis in the LUE and LLE  as well as left neglect.  Feel overall she has made some progress with OT on inpatient rehab but still needs extensive rehab to further progress UE use and ADL function.  Recommend 24 hour supervision/assistance at discharge.  Pt's family has been educated on safe assist with selfcare tasks, PROM exercises, and functional transfers.     Recommendation:  Patient will benefit from ongoing skilled OT services in outpatient setting to continue to advance functional skills in the area of BADL.  Equipment: No equipment provided  Reasons for discharge: treatment goals met and discharge from hospital  Patient/family agrees with progress made and goals achieved: Yes  OT Discharge Precautions/Restrictions  Precautions Precautions: Fall Precaution Comments: Lt shoulder subluxation risk, left neglect with right peripheral gaze Restrictions Weight Bearing Restrictions: No  Pain Pain Assessment Pain Assessment: No/denies pain ADL  See FIM scale for details  Vision/Perception  Vision- History Baseline Vision/History: No visual deficits Patient  Visual Report:  (Pt able to state that she has difficulty seeing to the left side) Vision- Assessment Vision Assessment?: Yes Eye Alignment: Within Functional Limits Ocular Range of Motion: Within Functional Limits Alignment/Gaze Preference: Head turned;Gaze right (Head more midline but still to the right) Tracking/Visual Pursuits: Decreased smoothness of vertical tracking;Decreased smoothness of horizontal tracking Saccades: Within functional limits Convergence: Impaired (comment) (No convergence noted) Visual Fields: Left visual field deficit  Cognition Overall Cognitive Status: Impaired/Different from baseline Arousal/Alertness: Awake/alert Orientation Level: Oriented X4 Attention: Selective;Sustained Focused Attention: Appears intact Sustained Attention: Appears intact Sustained Attention Impairment: Verbal basic;Functional basic Selective Attention: Appears intact Memory: Impaired Memory Impairment: Storage deficit;Decreased short term memory Decreased Short Term Memory: Functional basic Awareness: Impaired Awareness Impairment: Emergent impairment Problem Solving: Impaired Problem Solving Impairment: Functional basic Executive Function: Sequencing;Organizing;Self Monitoring;Self Correcting Sequencing: Impaired Sequencing Impairment: Functional basic Organizing: Impaired Organizing Impairment: Functional basic Self Monitoring: Impaired Self Monitoring Impairment: Functional basic Self Correcting: Impaired Self Correcting Impairment: Functional basic Safety/Judgment: Impaired Comments: Pt continues with decreased carryover from one session to the next with regards to scanning to the left and for hemi techniques. Sensation Sensation Light Touch: Impaired Detail Light Touch Impaired Details: Absent LUE;Impaired LLE Stereognosis: Not tested Hot/Cold: Not tested Proprioception: Impaired Detail Proprioception Impaired Details: Absent LUE;Impaired LLE Additional Comments:  Pt unable to detect any light touch throughout the LUE but could detect deep pressure in the upper arm and forearm. Coordination Gross Motor Movements are Fluid and Coordinated: No Fine Motor Movements are Fluid and Coordinated: No Coordination and Movement Description: Pt is currently Brunnstrum stage I in the left hand and stage II in the left arm.  Motor  Motor Motor: Hemiplegia;Abnormal postural alignment and control  Motor - Discharge Observations: moderate LUE hemiplegia and mild LLE hemiplegia still present Mobility  Bed Mobility Bed Mobility: Supine to Sit;Sit to Supine Supine to Sit: HOB flat;3: Mod assist Supine to Sit Details: Verbal cues for technique;Verbal cues for sequencing;Manual facilitation for weight shifting Supine to Sit Details (indicate cue type and reason): mod A to roll to R, supervision to push up with LUE sidelying > sit Sit to Supine: HOB flat;3: Mod assist Sit to Supine - Details: Verbal cues for technique;Verbal cues for sequencing Sit to Supine - Details (indicate cue type and reason): mod A for BLE management Transfers Transfers: Sit to Stand;Stand to Sit Sit to Stand: 5: Supervision;From chair/3-in-1;From bed Sit to Stand Details: Verbal cues for precautions/safety;Verbal cues for technique Stand to Sit: 5: Supervision;To chair/3-in-1;To bed;To toilet Stand to Sit Details (indicate cue type and reason): Verbal cues for technique;Verbal cues for precautions/safety Stand to Sit Details: Pt requirs mod instructional cues to back all the way up to the chair and reach back with the RUE.   Trunk/Postural Assessment  Cervical Assessment Cervical Assessment: Within Functional Limits Cervical Strength Overall Cervical Strength Comments: Pt demonstrates slight cervical rotation to the right. Thoracic Assessment Thoracic Assessment: Within Functional Limits Lumbar Assessment Lumbar Assessment: Within Functional Limits Postural Control Postural Control:  Deficits on evaluation Postural Limitations: Pt with increased left trunk elongation and increased weightshift to the left in sitting and standing.     Balance Balance Balance Assessed: Yes Standardized Balance Assessment Standardized Balance Assessment: Berg Balance Test Berg Balance Test Sit to Stand: Able to stand  independently using hands Standing Unsupported: Able to stand safely 2 minutes Sitting with Back Unsupported but Feet Supported on Floor or Stool: Able to sit safely and securely 2 minutes Stand to Sit: Sits safely with minimal use of hands Transfers: Able to transfer safely, definite need of hands Standing Unsupported with Eyes Closed: Able to stand 10 seconds safely Standing Ubsupported with Feet Together: Able to place feet together independently and stand for 1 minute with supervision From Standing, Reach Forward with Outstretched Arm: Reaches forward but needs supervision From Standing Position, Pick up Object from Floor: Able to pick up shoe safely and easily From Standing Position, Turn to Look Behind Over each Shoulder: Looks behind from both sides and weight shifts well Turn 360 Degrees: Needs close supervision or verbal cueing Standing Unsupported, Alternately Place Feet on Step/Stool: Able to complete >2 steps/needs minimal assist Standing Unsupported, One Foot in Front: Able to plae foot ahead of the other independently and hold 30 seconds Standing on One Leg: Tries to lift leg/unable to hold 3 seconds but remains standing independently Total Score: 40 Static Sitting Balance Static Sitting - Balance Support: Feet supported Static Sitting - Level of Assistance: 5: Stand by assistance Dynamic Sitting Balance Dynamic Sitting - Balance Support: No upper extremity supported;During functional activity;Feet supported Dynamic Sitting - Level of Assistance: 5: Stand by assistance Static Standing Balance Static Standing - Balance Support: During functional activity;Right  upper extremity supported Static Standing - Level of Assistance: 5: Stand by assistance Extremity/Trunk Assessment RUE Assessment RUE Assessment: Within Functional Limits LUE Assessment LUE Assessment: Exceptions to [facility] LUE Strength LUE Overall Strength Comments: Pt is currently Brunnstrum stage II movement in the left arm and stage i in the hand.  Moderate flexor tone in the left shoulder and elbow.  PROM left shoulder flexion 0-150 degrees with some end range pain,  Digit flexion extension and wrist flexion extension PROM WFLs. LUE Tone  LUE Tone: Moderate  See FIM for current functional status  Cambridge Deleo OTR/L 06/22/2014, 4:26 PM

## 2014-06-23 MED ORDER — SENNOSIDES-DOCUSATE SODIUM 8.6-50 MG PO TABS
3.0000 | ORAL_TABLET | Freq: Two times a day (BID) | ORAL | Status: DC
Start: 2014-06-23 — End: 2020-09-20

## 2014-06-23 MED ORDER — METOPROLOL SUCCINATE 12.5 MG HALF TABLET
12.5000 mg | ORAL_TABLET | Freq: Every day | ORAL | Status: DC
  Filled 2014-06-23: qty 1

## 2014-06-23 MED ORDER — TRAMADOL-ACETAMINOPHEN 37.5-325 MG PO TABS: 1.0000 | ORAL_TABLET | Freq: Four times a day (QID) | ORAL | Status: DC | PRN

## 2014-06-23 MED ORDER — BACLOFEN 20 MG PO TABS: 20.0000 mg | ORAL_TABLET | Freq: Three times a day (TID) | ORAL | Status: DC

## 2014-06-23 MED ORDER — METHOCARBAMOL 500 MG PO TABS: 500.0000 mg | ORAL_TABLET | Freq: Three times a day (TID) | ORAL | Status: DC | PRN

## 2014-06-23 MED ORDER — METOPROLOL SUCCINATE ER 25 MG PO TB24: 12.5000 mg | ORAL_TABLET | Freq: Every day | ORAL | Status: DC

## 2014-06-23 MED ORDER — FAMOTIDINE 10 MG PO TABS: 10.0000 mg | ORAL_TABLET | Freq: Two times a day (BID) | ORAL | Status: DC

## 2014-06-23 MED ORDER — CALCIUM CARBONATE-VITAMIN D 500-200 MG-UNIT PO TABS: 1.0000 | ORAL_TABLET | Freq: Two times a day (BID) | ORAL | Status: DC

## 2014-06-23 MED ORDER — CLOPIDOGREL BISULFATE 75 MG PO TABS: 75.0000 mg | ORAL_TABLET | Freq: Every day | ORAL | Status: DC

## 2014-06-23 NOTE — Progress Notes (Signed)
Patient discharged at 12:55 to home with family. Discharge instructions given by Algis Liming PA. All questions answered patient and family verbalized understanding. Jimmie Molly, RN

## 2014-06-23 NOTE — Plan of Care (Signed)
Problem: RH Leisure Awareness Goal: LTG: Patient will participate in leisure activities (TR) LTG: Patient will participate in leisure activities (simple/moderate/difficult) to increase ability to functionally perform activity, identify and utilize resources, identify new leisure interests, utilize adaptive equipment at specific level (TR)  Outcome: Completed/Met Date Met:  06/23/14

## 2014-06-23 NOTE — Discharge Summary (Signed)
Physician Discharge Summary  Patient ID: Mary [name] MRN: [id number] DOB/AGE: 1945/12/16 68 y.o.  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    Labs:  Basic Metabolic Panel: BMP Latest Ref Rng 06/22/2014 06/15/2014 06/09/2014  Glucose 70 - 99 mg/dL 103(H) - 94  BUN 6 - 23 mg/dL 11 - 12  Creatinine 0.50 - 1.10 mg/dL 0.70 0.76 0.68  Sodium 137 - 147 mEq/L 141 - 142  Potassium 3.7 - 5.3 mEq/L 3.9 - 3.8  Chloride 96 - 112 mEq/L 105 - 107  CO2 19 - 32 mEq/L 22 - 21  Calcium 8.4 - 10.5 mg/dL 8.8 - 8.2(L)     CBC: CBC Latest Ref Rng 06/09/2014 06/04/2014 06/03/2014  WBC 4.0 - 10.5 K/uL 9.5 15.8(H) -  Hemoglobin 12.0 - 15.0 g/dL 13.1 12.4 18.0(H)  Hematocrit 36.0 - 46.0 % 39.4 38.0 53.0(H)  Platelets 150 - 400 K/uL 246 PLATELET CLUMPS NOTED ON SMEAR, COUNT APPEARS ADEQUATE -     CBG: No results for input(s): GLUCAP in the last 168 hours.  Brief HPI:   Mary [name] is a 68 y.o. RH female with h/o HTN, morbid obesity, dyslipidemia who was admitted on 06/03/2014 with left-sided weakness. Cranial CT scan was  negative and patient underwent CTA head/neck which revealed distal right M1 occlusion with question of low density right temporal, parietal lobe and insula.  She underwent endovacular revascularization of R-MCA with thrombectomy by Dr. Estanislado Pandy. MRI of the brain shows acute infarct right MCA territory in the region of the insula, frontoparietal and high parietal region.  MRA of the brain with no stenosis or aneurysm and return of flow in R-MCA territory with luxury perfusion. TEE showed normal LVF, no LAA, negative for PFO  and loop recorder was placed by Dr. [name] on 11/14. She was started on dysphagia 2 with thin liquids. Patient with resultant L-HH, right gaze preference, left facial droop with mild dysarthria as well as right hemiparesis. Dr. Leonie Man recommended ASA for embolic infarct due to unknown etiology. Therapy ongoing and CIR recommended by MD and rehab team.    Hospital Course: Mary [name] was admitted to rehab 06/08/2014 for inpatient therapies to consist of PT, ST and OT at least three hours five days a week. Past admission physiatrist, therapy team and rehab RN have worked together to provide customized collaborative inpatient rehab. She was changed to plavix due to ASA intolerance for secondary stroke prevention. She had complaints of daily headaches therefore tramadol has been effective for symptom management. Blood pressures were monitored on tid basis and medications have been  adjusted due to orthostatic symptoms. She has presyncope event on am past admission and Lisinopril was discontinued with improvement in blood pressure support. Po intake has slowly improved. She continues to have left hemiparesis with development of spasticity at left elbow and left shoulder. Baclofen was added to help with symptoms and has been titrated to 20 mg tid without adverse side effects. Dysphagia has improved and she is tolerating dysphagia 3 diet without difficulty. She has made steady progress and is at supervision to min assist level at discharge. She will continue to receive follow up HHPT,HHOT, [place] and [place] [facility] past discharge.    Rehab course: During patient's stay in rehab weekly team conferences were held to monitor patient's progress, set goals and discuss barriers to discharge. Patient required moderate assistance with self care tasks and min to moderate assistance with mobility. She has had improvement in activity tolerance, balance, postural control, as well as ability to compensate for deficits.  she is has had improvement in functional use RUE  and RLE with improvement in left inattention as well as improved awareness. She is able to bathe herself with steady assist. She requires moderate assist with upper body dressing and min assist with lower body dressing. She requires moderate assist with bed mobility and She is able to perform sit to stand transfers and ambulate 150 feet with RW and supervision.  She requires min assist to supervision for basic cognitive linguistic tasks and is able to utilize safe swallow strategies with supervision.  Family education was done with daughter and granddaughter regarding all aspects of safety, assistance needed as well as assistance with medication/financial management.     Disposition: 01-Home or Self Care  Diet: Cardiac diet, thin liquids. Supervision at meals.      Medication List    STOP taking these medications        lisinopril 20 MG tablet  Commonly known as:  PRINIVIL,ZESTRIL      TAKE these medications        acetaminophen 325 MG tablet  Commonly known as:  TYLENOL  Take 650 mg by mouth every 6 (six) hours as needed (pain).     ALAVERT 10 MG tablet  Generic drug:  loratadine  Take 10 mg by mouth daily as needed for allergies.     B COMPLEX-B12 PO  Take 1 tablet by mouth daily.     baclofen 20 MG tablet  Commonly known as:  LIORESAL  Take 1 tablet (20 mg total) by mouth 3 (three) times daily.     calcium-vitamin D 500-200 MG-UNIT per tablet  Commonly known as:  OSCAL WITH D  Take 1 tablet by mouth 2 (two) times daily.     clopidogrel 75 MG tablet  Commonly known as:  PLAVIX  Take 1 tablet (75 mg total) by mouth daily.     famotidine 10 MG tablet  Commonly known as:  PEPCID  Take 1 tablet (10 mg total) by mouth 2 (two) times daily.     fish oil-omega-3 fatty acids 1000 MG capsule  Take 1 g by mouth daily.     methocarbamol 500 MG tablet  Commonly known as:  ROBAXIN  Take 1 tablet (500 mg total) by mouth every 8  (eight) hours as needed for muscle spasms.     metoprolol succinate 25 MG 24 hr tablet  Commonly known as:  TOPROL-XL  Take 0.5 tablets (12.5 mg total) by mouth daily.     multivitamin with minerals Tabs tablet  Take 1 tablet by mouth daily.     senna-docusate 8.6-50 MG per tablet  Commonly known as:  Senokot-S  Take 3 tablets by mouth 2 (two) times daily.     SYSTANE OP  Place 1 drop into both eyes daily as needed (dry eyeys).     traMADol-acetaminophen 37.5-325 MG per tablet  Commonly known as:  ULTRACET  Take 1 tablet by mouth every 6 (six) hours as needed for moderate pain or severe pain.       Follow-up Information    Follow up with Charlett Blake, MD On 07/25/2014.  Specialty:  Physical Medicine and Rehabilitation   Why:  BE there 12:14 at 12:45 pm  for  appointment   Contact information:   [address] [place] [address] [phone]       Follow up with Antony Contras, MD. Call today.   Specialties:  Neurology, Radiology   Why:  for follow up appointment in 4-6 weeks   Contact information:   [place] [address] [phone]       Follow up with [name], MD On 08/01/2014.   Specialties:  Family Medicine, Sports Medicine   Why:  APPT @ 4;00 PM   Contact information:   [place] [address] [phone]       Signed: Bary Leriche 06/29/2014, 4:10 PM

## 2014-06-23 NOTE — Progress Notes (Signed)
Recreational Therapy Discharge Summary Patient Details  Name: Caylin Nass MRN: [id number] Date of Birth: March 23, 1946 Today's Date: 06/23/2014  Long term goals set: 1  Long term goals met: 1  Comments on progress toward goals: Pt has made great progress toward goal & is discharging home with family to provide 24 hour supervision/assist.  Pt does require set up assist & verbal cues for completion of TR tasks.  Family present throughout LOS & engaged in therapeutic activity. Reasons for discharge: discharge from hospital Patient/family agrees with progress made and goals achieved: Yes  Clement Deneault 06/23/2014, 2:33 PM

## 2014-06-23 NOTE — Progress Notes (Signed)
 Subjective/Complaints: No Pains . Review of Systems - Negative except as above , denies SOB Objective: Vital Signs: Blood pressure 91/41, pulse 54, temperature 97.6 F (36.4 C), temperature source Oral, resp. rate 18, height 5' 7" (1.702 m), weight 96.525 kg (212 lb 12.8 oz), SpO2 98 %. No results found. Results for orders placed or performed during the hospital encounter of 06/08/14 (from the past 72 hour(s))  Basic metabolic panel     Status: Abnormal   Collection Time: 06/22/14  4:36 AM  Result Value Ref Range   Sodium 141 137 - 147 mEq/L   Potassium 3.9 3.7 - 5.3 mEq/L   Chloride 105 96 - 112 mEq/L   CO2 22 19 - 32 mEq/L   Glucose, Bld 103 (H) 70 - 99 mg/dL   BUN 11 6 - 23 mg/dL   Creatinine, Ser 0.70 0.50 - 1.10 mg/dL   Calcium 8.8 8.4 - 10.5 mg/dL   GFR calc non Af Amer 88 (L) >90 mL/min   GFR calc Af Amer >90 >90 mL/min    Comment: (NOTE) The eGFR has been calculated using the CKD EPI equation. This calculation has not been validated in all clinical situations. eGFR's persistently <90 mL/min signify possible Chronic Kidney Disease.    Anion gap 14 5 - 15     HEENT: normal Cardio: RRR and no murmur Resp: CTA B/L and unlabored GI: BS positive and NT Extremity:  Pulses positive and No Edema Skin:   Intact Neuro: alert/flat, Cranial Nerve Abnormalities Left central VII, Abnormal Sensory absent LT LUE, Abnormal Motor 0/5 LUE, 3- LLE, 5/5 on Right, Tone:  Increased Left biceps 2-3/4 , severe L Inattention, modified Ashworth score 3 at the biceps and pectoralis Musc/Skel:  Pain with Left arm ext rotation Gen NAD   Assessment/Plan: 1. Functional deficits secondary to Right MCA infarct uncertain etiology Stable for D/C today F/u PCP in 1-2 weeks F/u PM&R 3 weeks See D/C summary See D/C instructions FIM: FIM - Bathing Bathing Steps Patient Completed: Chest, Left Arm, Abdomen, Front perineal area, Buttocks, Right upper leg, Left upper leg, Right lower leg (including  foot), Left lower leg (including foot) Bathing: 4: Steadying assist  FIM - Upper Body Dressing/Undressing Upper body dressing/undressing steps patient completed: Thread/unthread right sleeve of pullover shirt/dresss, Put head through opening of pull over shirt/dress Upper body dressing/undressing: 3: Mod-Patient completed 50-74% of tasks FIM - Lower Body Dressing/Undressing Lower body dressing/undressing steps patient completed: Thread/unthread right underwear leg, Thread/unthread right pants leg, Thread/unthread left pants leg, Thread/unthread left underwear leg, Don/Doff right shoe, Don/Doff left shoe Lower body dressing/undressing: 4: Min-Patient completed 75 plus % of tasks  FIM - Toileting Toileting steps completed by patient: Adjust clothing prior to toileting, Performs perineal hygiene Toileting Assistive Devices: Grab bar or rail for support Toileting: 3: Mod-Patient completed 2 of 3 steps  FIM - Toilet Transfers Toilet Transfers Assistive Devices: Bedside commode Toilet Transfers: 4-To toilet/BSC: Min A (steadying Pt. > 75%), 4-From toilet/BSC: Min A (steadying Pt. > 75%)  FIM - Bed/Chair Transfer Bed/Chair Transfer Assistive Devices: Arm rests Bed/Chair Transfer: 5: Chair or W/C > Bed: Supervision (verbal cues/safety issues), 5: Bed > Chair or W/C: Supervision (verbal cues/safety issues)  FIM - Locomotion: Wheelchair Distance: 150 Locomotion: Wheelchair: 5: Travels 150 ft or more: maneuvers on rugs and over door sills with supervision, cueing or coaxing FIM - Locomotion: Ambulation Locomotion: Ambulation Assistive Devices: Other (comment) (none) Ambulation/Gait Assistance: 5: Supervision Locomotion: Ambulation: 5: Travels 150 ft or more   with supervision/safety issues  Comprehension Comprehension Mode: Auditory Comprehension: 5-Understands basic 90% of the time/requires cueing < 10% of the time  Expression Expression Mode: Verbal Expression: 5-Expresses basic 90% of the  time/requires cueing < 10% of the time.  Social Interaction Social Interaction: 5-Interacts appropriately 90% of the time - Needs monitoring or encouragement for participation or interaction.  Problem Solving Problem Solving: 5-Solves basic 90% of the time/requires cueing < 10% of the time  Memory Memory: 5-Recognizes or recalls 90% of the time/requires cueing < 10% of the time   Medical Problem List and Plan: 1. Functional deficits secondary to R-MCA infarct, increasing tone cont PT/OT,-on plavix secondary to ASA intolerance 2.  DVT Prophylaxis/Anticoagulation: Pharmaceutical: Lovenox 3. Headaches/ Pain Management:    Changed oxy IR to tramadol. Heat, ROM for right sided muscle soreness 4. Mood: LCSW to follow for evaluation and support.   5. Neuropsych: This patient is capable of making decisions on her own behalf. 6. Skin/Wound Care: Routine pressure relief measures.   7. Fluids/Electrolytes/Nutrition: Monitor I/O especially as on dysphagia diet.   8. HTN: Monitor BP every 8 hours. Continue metoprolol and lisinopril daily. Avoid hypotension---better control.   9. Dyslipidemia: intolerant of statins 10.  Constipation- adjusting Bowel prog 11. Hypertonicity: baclofen 20mg TID, mainly LUE, biceps, may need Botox as outpt  LOS (Days) 15 A FACE TO FACE EVALUATION WAS PERFORMED  KIRSTEINS,ANDREW E 06/23/2014, 9:03 AM    

## 2014-06-23 NOTE — Progress Notes (Signed)
Recreational Therapy Session Note  Patient Details  Name: Mary [name] MRN: [id number] Date of Birth: 1946-01-24 Today's Date: 06/23/2014  LATE ENTRY from 06/22/14 Pain: no c/o Skilled Therapeutic Interventions/Progress Updates: Session focused on kitchen activity seated w/c level with set up assist & verbal cues for problem solving & visual scanning. Remainder of session focused on problem solving & scanning for jigsaw puzzle activity.  Therapy/Group: Individual Therapy   Tome Wilson 06/23/2014, 2:29 PM

## 2014-06-23 NOTE — Progress Notes (Signed)
Social Work Discharge Note Discharge Note  The overall goal for the admission was met for:   Discharge location: Yes-HOME WITH HUSBAND, DAUGHTER AND GRANDDAUGHTER-24 HR CARE  Length of Stay: Yes-15 DAYS  Discharge activity level: Yes-SUPERVISION/MIN LEVEL  Home/community participation: Yes  Services provided included: MD, RD, PT, OT, SLP, RN, CM, TR, Pharmacy and SW  Financial Services: Medicare and Private Insurance: [employer] [place]  Follow-up services arranged: Home Health: [facility]-PT,OT,RN,SP, DME: [facility]-WHEELCHAIR & DROP-ARM BSC and Patient/Family has no preference for HH/DME agencies  Comments (or additional information):DAUGHTER AND GRANDAUGHTER WENT THROUGH FAMILY EDUCATION AND COMFORTABLE WITH HER CARE  Patient/Family verbalized understanding of follow-up arrangements: Yes  Individual responsible for coordination of the follow-up plan: [name]-DAUGHTER & HUSBAND  Confirmed correct DME delivered: [name], Mary G 06/23/2014    [name], Mary G 

## 2014-06-23 NOTE — Progress Notes (Signed)
Social Work [name], [place] Social Worker Signed  Patient Care Conference 06/22/2014  2:10 PM    Expand All Collapse All   Inpatient RehabilitationTeam Conference and Plan of Care Update Date: 06/22/2014      Time: 11;15 AM     Patient Name: Mary [name]       Medical Record Number: [id number]  Date of Birth: 09-04-1945 Sex: Female         Room/Bed: 4W06C/4W06C-01 Payor Info: Payor: MEDICARE / Plan: MEDICARE PART A AND B / Product Type: *No Product type* /    Admitting Diagnosis: R CVA   Admit Date/Time:  06/08/2014  4:52 PM Admission Comments: No comment available   Primary Diagnosis:  Cerebral infarction due to occlusion of right middle cerebral artery Principal Problem: Cerebral infarction due to occlusion of right middle cerebral artery    Patient Active Problem List     Diagnosis  Date Noted   .  Spondylosis of lumbar region without myelopathy or radiculopathy  06/20/2014   .  Left hemiparesis  06/10/2014   .  Left-sided neglect  06/10/2014   .  Thrombotic stroke involving right middle cerebral artery  06/08/2014   .  Morbid obesity- BMI 40  06/06/2014   .  Acute respiratory failure with hypoxia  06/05/2014   .  Cerebral infarction due to occlusion of right middle cerebral artery  06/03/2014   .  Cerebral infarct  06/03/2014   .  Altered mental status  06/03/2014   .  GERD (gastroesophageal reflux disease)  04/12/2014   .  Benign essential HTN  02/07/2014   .  Dyslipidemia- statin intol  02/07/2014   .  Chest pain- Low risk Myoview July 2015  02/06/2014     Expected Discharge Date: Expected Discharge Date: 06/23/14  Team Members Present: Physician leading conference: Dr. Alysia Penna Social Worker Present: Ovidio Kin, LCSW Nurse Present: Heather Roberts, RN PT Present: Carney Living, PT OT Present: Benay Pillow, OT SLP Present: Gunnar Fusi, SLP PPS Coordinator present : Daiva Nakayama, RN, CRRN        Current Status/Progress  Goal  Weekly Team Focus    Medical     CVA, left hemiparesis, left neglect, obesity, left shoulder pain  Maximize functional recovery for home discharge   Treat left shoulder pain probable frozen shoulder    Bowel/Bladder     Pt. continent of bowel and bladder. Pt has Senokot-S 3 tabs BID   manage b/b with minimal assist  Cont. with medication regiemn. LBM 12/1    Swallow/Nutrition/ Hydration     Dys 3 solids and thin liquids, supervision for use of swallowing precautions   supervision   complete family education prior to discharge    ADL's     min guard for toilet transfers and shower transfers, min assist for toileting and LB dressing tasks.  Mod assist for UB dressing with min assist for all bathing.  Still with Brunnstrum stage II trace shoulder movement in the left arm and stage I in the hand.  Left visual inattention as well as decreased visual perceptual processing when attempting to identify parts of her clothing.  min assist to supervision level for bathing, dressing, toileting, and functional transfers  selfcare re-training, functional transfers, pt/family education, neuromuscular re-education, balance re-training   Mobility     supervision overall except mod A bed mobility   supervision overall  L NMR, attending to L, functional mobility, balance, pt/family training   Communication  supervision   supervision   d/c goal    Safety/Cognition/ Behavioral Observations    min assist for basic tasks   min assist   complete family education prior to discharge    Pain     Pt. c/o headache and/or leg pain at times. 1 tab. Ultracet q 4hr PRN for pain  3 or less out of 10  Assess pain q 4hr, medicate as needed and prior to therapy if needed   Skin     No significant skin issues present at this time  remain free of skin breakdown with minimal assist   assess skin q shift; turn pt. q 2hrs at HS      *See Care Plan and progress notes for long and short-term goals.    Barriers to Discharge:  Physical assistance  as well as pain issues      Possible Resolutions to Barriers:   Caregiver training     Discharge Planning/Teaching Needs:   Family education today with daughter to prepare for discharge tomorrow.       Team Discussion:    Family education with daughter and granddaughter today, reached her goals and medically stable for discharge tomorrow.  Tone in shoulder-meds for which help.     Revisions to Treatment Plan:    Upgraded diet to Dys 3 thin liquids    Continued Need for Acute Rehabilitation Level of Care: The patient requires daily medical management by a physician with specialized training in physical medicine and rehabilitation for the following conditions: Daily direction of a multidisciplinary physical rehabilitation program to ensure safe treatment while eliciting the highest outcome that is of practical value to the patient.: Yes Daily medical management of patient stability for increased activity during participation in an intensive rehabilitation regime.: Yes Daily analysis of laboratory values and/or radiology reports with any subsequent need for medication adjustment of medical intervention for : Neurological problems;Other  [name] 06/23/2014, 8:41 AM                 [name], LCSW Social Worker Signed  Patient Care Conference 06/15/2014  1:29 PM    Expand All Collapse All   Inpatient RehabilitationTeam Conference and Plan of Care Update Date: 06/15/2014   Time: 11;25 AM     Patient Name: Mary [name]      Medical Record Number: [id number]  Date of Birth: [birth year] Sex: Female         Room/Bed: 4W06C/4W06C-01 Payor Info: Payor: MEDICARE / Plan: MEDICARE PART A AND B / Product Type: *No Product type* /    Admitting Diagnosis: R CVA   Admit Date/Time:  06/08/2014  4:52 PM Admission Comments: No comment available   Primary Diagnosis:  Cerebral infarction due to occlusion of right middle cerebral artery Principal Problem: Cerebral infarction due to  occlusion of right middle cerebral artery    Patient Active Problem List     Diagnosis  Date Noted   .  Left hemiparesis  06/10/2014   .  Left-sided neglect  06/10/2014   .  Thrombotic stroke involving right middle cerebral artery  06/08/2014   .  Morbid obesity- BMI 40  06/06/2014   .  Acute respiratory failure with hypoxia  06/05/2014   .  Cerebral infarction due to occlusion of right middle cerebral artery  06/03/2014   .  Cerebral infarct  06/03/2014   .  Altered mental status  06/03/2014   .  GERD (gastroesophageal reflux disease)  04/12/2014   .  Benign essential HTN  02/07/2014   .  Dyslipidemia- statin intol  02/07/2014   .  Chest pain- Low risk Myoview July 2015  02/06/2014     Expected Discharge Date: Expected Discharge Date: 06/23/14  Team Members Present: Physician leading conference: Dr. Alysia Penna Social Worker Present: Ovidio Kin, LCSW Nurse Present: Heather Roberts, RN PT Present: Raylene Everts, PT;Jakai Onofre Jari Favre, PT OT Present: Benay Pillow, Maryella Shivers, OT SLP Present: Windell Moulding, SLP PPS Coordinator present : Daiva Nakayama, RN, CRRN        Current Status/Progress  Goal  Weekly Team Focus   Medical     CVA, left hemiparesis, left neglect, obesity   Maintain medical stability  Improve awareness to the left side    Bowel/Bladder     Pt cont. of bowel and bladder. Pt has colace 3 tabs BID  manage b/b with minimal assist  cont. medication regiemn; pt have BM q 2 days   Swallow/Nutrition/ Hydration     upgraded to dys 3 solids and thin liquids   supervision with least restrictive diet   diet tolerance, carryover for use of swallowing precautions    ADL's     Min assist for toilet transfers and shower transfers, mod assist for toileting and LB dressing tasks.  min assist for all bathing and mod assist UB dressing.  Continues with Brunnstrum stage II in the left arm and stage I in the hand.  Increased flexor tone beginning in the elbow and pectoral.  Left  inattention as well.   supervision for bathing, dressing, and toileting   selfcare retraining, scanning to the left, balance re-training, left UE neuromuscular reoeducation, pt/family education.     Mobility     min-mod A gait up to 150 ft with R HHA, mod A bed mobility, min A stairs with 2 rails, min A transfers  supervision overall  L NMR, attending to L, safety awareness, functional mobility, balance   Communication     mild dysarthria  supervision   education and carryover of compensatory strategies    Safety/Cognition/ Behavioral Observations    mod-max assist for basic tasks   min assist   scanning to the left, attention, problem solving, recall of new information   Pain     Pt complains of headache at times- oxycodone 10mg  IR PRN to manage  3 or less out of 10  assess pain q shift; medicate as needed and prior to therapy id needed   Skin     scattered bruising to abdomen and arms; no significant skin issues present  remain free of skin breakdown with minimal assist   assess skin q shift; turn pt q3hrs HS       *See Care Plan and progress notes for long and short-term goals.    Barriers to Discharge:  Still requiring physical assistance      Possible Resolutions to Barriers:   Continue rehabilitation, identify caregivers at home will need 24 7 care post discharge     Discharge Planning/Teaching Needs:   HOme with husband and daughter where she will have 24 hr care.  Daughter has been here daily and participating in therapies with her and providing emotional support       Team Discussion:    Does well with mobility although neglect and sensation issues. Diet upgraded-Dys 3 today. Dizzy at times-wearing Teds hose now. Need cues to slow down and look to the left.   Revisions to Treatment Plan:    Diet upgraded  Continued Need for Acute Rehabilitation Level of Care: The patient requires daily medical management by a physician with specialized training in physical medicine and  rehabilitation for the following conditions: Daily direction of a multidisciplinary physical rehabilitation program to ensure safe treatment while eliciting the highest outcome that is of practical value to the patient.: Yes Daily analysis of laboratory values and/or radiology reports with any subsequent need for medication adjustment of medical intervention for : Neurological problems;Other  Sherron Mummert, Gardiner Rhyme 06/15/2014, 1:30 PM                 [name], LCSW Social Worker Signed  Patient Care Conference 06/15/2014  1:23 PM    Expand All Collapse All   Inpatient RehabilitationTeam Conference and Plan of Care Update Date: 06/15/2014   Time: 10;45 AM     Patient Name: Mary [name]      Medical Record Number: [id number]  Date of Birth: [birth year] Sex: Female         Room/Bed: 4W06C/4W06C-01 Payor Info: Payor: MEDICARE / Plan: MEDICARE PART A AND B / Product Type: *No Product type* /    Admitting Diagnosis: R CVA   Admit Date/Time:  06/08/2014  4:52 PM Admission Comments: No comment available   Primary Diagnosis:  Cerebral infarction due to occlusion of right middle cerebral artery Principal Problem: Cerebral infarction due to occlusion of right middle cerebral artery    Patient Active Problem List     Diagnosis  Date Noted   .  Left hemiparesis  06/10/2014   .  Left-sided neglect  06/10/2014   .  Thrombotic stroke involving right middle cerebral artery  06/08/2014   .  Morbid obesity- BMI 40  06/06/2014   .  Acute respiratory failure with hypoxia  06/05/2014   .  Cerebral infarction due to occlusion of right middle cerebral artery  06/03/2014   .  Cerebral infarct  06/03/2014   .  Altered mental status  06/03/2014   .  GERD (gastroesophageal reflux disease)  04/12/2014   .  Benign essential HTN  02/07/2014   .  Dyslipidemia- statin intol  02/07/2014   .  Chest pain- Low risk Myoview July 2015  02/06/2014     Expected Discharge Date: Expected Discharge Date:  05/30/15  Team Members Present: Physician leading conference: Dr. Alysia Penna Social Worker Present: Ovidio Kin, LCSW Nurse Present: Heather Roberts, RN PT Present: Raylene Everts, PT;Caroline Lacinda Axon, PT;Blair Hobble, PT OT Present: Blanchard Mane, OT SLP Present: Windell Moulding, SLP PPS Coordinator present : Daiva Nakayama, RN, CRRN        Current Status/Progress  Goal  Weekly Team Focus   Medical     CVA, left hemiparesis, left neglect, obesity   Maintain medical stability  Improve awareness to the left side    Bowel/Bladder     Pt cont. of bowel and bladder. Pt has colace 3 tabs BID  manage b/b with minimal assist  cont. medication regiemn; pt have BM q 2 days   Swallow/Nutrition/ Hydration     upgraded to dys 3 solids and thin liquids   supervision with least restrictive diet   diet tolerance, carryover for use of swallowing precautions    ADL's     Min assist for toilet transfers and shower transfers, mod assist for toileting and LB dressing tasks.  min assist for all bathing and mod assist UB dressing.  Continues with Brunnstrum stage II in the left arm and stage I in the hand.  Increased flexor tone beginning in the elbow and pectoral.  Left inattention as well.   supervision for bathing, dressing, and toileting   selfcare retraining, scanning to the left, balance re-training, left UE neuromuscular reoeducation, pt/family education.     Mobility     min-mod A gait up to 150 ft with R HHA, mod A bed mobility, min A stairs with 2 rails, min A transfers  supervision overall  L NMR, attending to L, safety awareness, functional mobility, balance   Communication     mild dysarthria  supervision   education and carryover of compensatory strategies    Safety/Cognition/ Behavioral Observations    mod-max assist for basic tasks   min assist   scanning to the left, attention, problem solving, recall of new information   Pain     Pt complains of headache at times- oxycodone 10mg  IR PRN to  manage  3 or less out of 10  assess pain q shift; medicate as needed and prior to therapy id needed   Skin     scattered bruising to abdomen and arms; no significant skin issues present  remain free of skin breakdown with minimal assist   assess skin q shift; turn pt q3hrs HS       *See Care Plan and progress notes for long and short-term goals.    Barriers to Discharge:  Still requiring physical assistance      Possible Resolutions to Barriers:   Continue rehabilitation, identify caregivers at home will need 24 7 care post discharge     Discharge Planning/Teaching Needs:   HOme with husband and daughter where she will have 24 hr care.  Daughter has been here daily and participating in therapies with her and providing emotional support       Team Discussion:    Poor endurance-doesn't sleep well and transfused yesterday.  Does what she wants and doesn't push herself. Will ask for Neuro-psych eval. Will confirm caregiver   Revisions to Treatment Plan:    Downgraded goals to min assist level    Continued Need for Acute Rehabilitation Level of Care: The patient requires daily medical management by a physician with specialized training in physical medicine and rehabilitation for the following conditions: Daily direction of a multidisciplinary physical rehabilitation program to ensure safe treatment while eliciting the highest outcome that is of practical value to the patient.: Yes Daily analysis of laboratory values and/or radiology reports with any subsequent need for medication adjustment of medical intervention for : Neurological problems;Other  [name] 06/15/2014, 1:23 PM                  Patient ID: Mary [name], female   DOB: [birth year], 68 y.o.   MRN: [id number]

## 2014-06-24 DIAGNOSIS — I10 Essential (primary) hypertension: Secondary | ICD-10-CM | POA: Diagnosis not present

## 2014-06-24 DIAGNOSIS — I69354 Hemiplegia and hemiparesis following cerebral infarction affecting left non-dominant side: Secondary | ICD-10-CM | POA: Diagnosis not present

## 2014-06-24 DIAGNOSIS — M47816 Spondylosis without myelopathy or radiculopathy, lumbar region: Secondary | ICD-10-CM | POA: Diagnosis not present

## 2014-06-24 DIAGNOSIS — E785 Hyperlipidemia, unspecified: Secondary | ICD-10-CM | POA: Diagnosis not present

## 2014-06-24 DIAGNOSIS — I6931 Cognitive deficits following cerebral infarction: Secondary | ICD-10-CM | POA: Diagnosis not present

## 2014-06-25 DIAGNOSIS — I69354 Hemiplegia and hemiparesis following cerebral infarction affecting left non-dominant side: Secondary | ICD-10-CM | POA: Diagnosis not present

## 2014-06-25 DIAGNOSIS — M47816 Spondylosis without myelopathy or radiculopathy, lumbar region: Secondary | ICD-10-CM | POA: Diagnosis not present

## 2014-06-25 DIAGNOSIS — I10 Essential (primary) hypertension: Secondary | ICD-10-CM | POA: Diagnosis not present

## 2014-06-25 DIAGNOSIS — I6931 Cognitive deficits following cerebral infarction: Secondary | ICD-10-CM | POA: Diagnosis not present

## 2014-06-25 DIAGNOSIS — E785 Hyperlipidemia, unspecified: Secondary | ICD-10-CM | POA: Diagnosis not present

## 2014-06-27 DIAGNOSIS — I6931 Cognitive deficits following cerebral infarction: Secondary | ICD-10-CM | POA: Diagnosis not present

## 2014-06-27 DIAGNOSIS — E785 Hyperlipidemia, unspecified: Secondary | ICD-10-CM | POA: Diagnosis not present

## 2014-06-27 DIAGNOSIS — I69354 Hemiplegia and hemiparesis following cerebral infarction affecting left non-dominant side: Secondary | ICD-10-CM | POA: Diagnosis not present

## 2014-06-27 DIAGNOSIS — I10 Essential (primary) hypertension: Secondary | ICD-10-CM | POA: Diagnosis not present

## 2014-06-27 DIAGNOSIS — M47816 Spondylosis without myelopathy or radiculopathy, lumbar region: Secondary | ICD-10-CM | POA: Diagnosis not present

## 2014-06-28 DIAGNOSIS — I6931 Cognitive deficits following cerebral infarction: Secondary | ICD-10-CM | POA: Diagnosis not present

## 2014-06-28 DIAGNOSIS — E785 Hyperlipidemia, unspecified: Secondary | ICD-10-CM | POA: Diagnosis not present

## 2014-06-28 DIAGNOSIS — I69354 Hemiplegia and hemiparesis following cerebral infarction affecting left non-dominant side: Secondary | ICD-10-CM | POA: Diagnosis not present

## 2014-06-28 DIAGNOSIS — M47816 Spondylosis without myelopathy or radiculopathy, lumbar region: Secondary | ICD-10-CM | POA: Diagnosis not present

## 2014-06-28 DIAGNOSIS — I10 Essential (primary) hypertension: Secondary | ICD-10-CM | POA: Diagnosis not present

## 2014-06-29 DIAGNOSIS — I10 Essential (primary) hypertension: Secondary | ICD-10-CM | POA: Diagnosis not present

## 2014-06-29 DIAGNOSIS — I69354 Hemiplegia and hemiparesis following cerebral infarction affecting left non-dominant side: Secondary | ICD-10-CM | POA: Diagnosis not present

## 2014-06-29 DIAGNOSIS — M47816 Spondylosis without myelopathy or radiculopathy, lumbar region: Secondary | ICD-10-CM | POA: Diagnosis not present

## 2014-06-29 DIAGNOSIS — E785 Hyperlipidemia, unspecified: Secondary | ICD-10-CM | POA: Diagnosis not present

## 2014-06-29 DIAGNOSIS — I6931 Cognitive deficits following cerebral infarction: Secondary | ICD-10-CM | POA: Diagnosis not present

## 2014-06-30 ENCOUNTER — Encounter (HOSPITAL_COMMUNITY): Payer: Self-pay | Admitting: Internal Medicine

## 2014-06-30 DIAGNOSIS — E785 Hyperlipidemia, unspecified: Secondary | ICD-10-CM | POA: Diagnosis not present

## 2014-06-30 DIAGNOSIS — I10 Essential (primary) hypertension: Secondary | ICD-10-CM | POA: Diagnosis not present

## 2014-06-30 DIAGNOSIS — M47816 Spondylosis without myelopathy or radiculopathy, lumbar region: Secondary | ICD-10-CM | POA: Diagnosis not present

## 2014-06-30 DIAGNOSIS — I69354 Hemiplegia and hemiparesis following cerebral infarction affecting left non-dominant side: Secondary | ICD-10-CM | POA: Diagnosis not present

## 2014-06-30 DIAGNOSIS — I6931 Cognitive deficits following cerebral infarction: Secondary | ICD-10-CM | POA: Diagnosis not present

## 2014-07-01 DIAGNOSIS — I6931 Cognitive deficits following cerebral infarction: Secondary | ICD-10-CM | POA: Diagnosis not present

## 2014-07-01 DIAGNOSIS — I10 Essential (primary) hypertension: Secondary | ICD-10-CM | POA: Diagnosis not present

## 2014-07-01 DIAGNOSIS — I69354 Hemiplegia and hemiparesis following cerebral infarction affecting left non-dominant side: Secondary | ICD-10-CM | POA: Diagnosis not present

## 2014-07-01 DIAGNOSIS — E785 Hyperlipidemia, unspecified: Secondary | ICD-10-CM | POA: Diagnosis not present

## 2014-07-01 DIAGNOSIS — M47816 Spondylosis without myelopathy or radiculopathy, lumbar region: Secondary | ICD-10-CM | POA: Diagnosis not present

## 2014-07-04 DIAGNOSIS — E785 Hyperlipidemia, unspecified: Secondary | ICD-10-CM | POA: Diagnosis not present

## 2014-07-04 DIAGNOSIS — I6931 Cognitive deficits following cerebral infarction: Secondary | ICD-10-CM | POA: Diagnosis not present

## 2014-07-04 DIAGNOSIS — I69354 Hemiplegia and hemiparesis following cerebral infarction affecting left non-dominant side: Secondary | ICD-10-CM | POA: Diagnosis not present

## 2014-07-04 DIAGNOSIS — I10 Essential (primary) hypertension: Secondary | ICD-10-CM | POA: Diagnosis not present

## 2014-07-04 DIAGNOSIS — M47816 Spondylosis without myelopathy or radiculopathy, lumbar region: Secondary | ICD-10-CM | POA: Diagnosis not present

## 2014-07-05 DIAGNOSIS — I69354 Hemiplegia and hemiparesis following cerebral infarction affecting left non-dominant side: Secondary | ICD-10-CM | POA: Diagnosis not present

## 2014-07-05 DIAGNOSIS — I6931 Cognitive deficits following cerebral infarction: Secondary | ICD-10-CM | POA: Diagnosis not present

## 2014-07-05 DIAGNOSIS — E785 Hyperlipidemia, unspecified: Secondary | ICD-10-CM | POA: Diagnosis not present

## 2014-07-05 DIAGNOSIS — I10 Essential (primary) hypertension: Secondary | ICD-10-CM | POA: Diagnosis not present

## 2014-07-05 DIAGNOSIS — M47816 Spondylosis without myelopathy or radiculopathy, lumbar region: Secondary | ICD-10-CM | POA: Diagnosis not present

## 2014-07-06 DIAGNOSIS — M47816 Spondylosis without myelopathy or radiculopathy, lumbar region: Secondary | ICD-10-CM | POA: Diagnosis not present

## 2014-07-06 DIAGNOSIS — I69354 Hemiplegia and hemiparesis following cerebral infarction affecting left non-dominant side: Secondary | ICD-10-CM | POA: Diagnosis not present

## 2014-07-06 DIAGNOSIS — I10 Essential (primary) hypertension: Secondary | ICD-10-CM | POA: Diagnosis not present

## 2014-07-06 DIAGNOSIS — E785 Hyperlipidemia, unspecified: Secondary | ICD-10-CM | POA: Diagnosis not present

## 2014-07-06 DIAGNOSIS — I6931 Cognitive deficits following cerebral infarction: Secondary | ICD-10-CM | POA: Diagnosis not present

## 2014-07-07 ENCOUNTER — Telehealth (HOSPITAL_COMMUNITY): Payer: Self-pay | Admitting: Interventional Radiology

## 2014-07-07 ENCOUNTER — Ambulatory Visit (INDEPENDENT_AMBULATORY_CARE_PROVIDER_SITE_OTHER): Payer: Medicare Other | Admitting: *Deleted

## 2014-07-07 DIAGNOSIS — I6931 Cognitive deficits following cerebral infarction: Secondary | ICD-10-CM | POA: Diagnosis not present

## 2014-07-07 DIAGNOSIS — I10 Essential (primary) hypertension: Secondary | ICD-10-CM | POA: Diagnosis not present

## 2014-07-07 DIAGNOSIS — I63411 Cerebral infarction due to embolism of right middle cerebral artery: Secondary | ICD-10-CM

## 2014-07-07 DIAGNOSIS — M47816 Spondylosis without myelopathy or radiculopathy, lumbar region: Secondary | ICD-10-CM | POA: Diagnosis not present

## 2014-07-07 DIAGNOSIS — I69354 Hemiplegia and hemiparesis following cerebral infarction affecting left non-dominant side: Secondary | ICD-10-CM | POA: Diagnosis not present

## 2014-07-07 DIAGNOSIS — E785 Hyperlipidemia, unspecified: Secondary | ICD-10-CM | POA: Diagnosis not present

## 2014-07-07 LAB — MDC_IDC_ENUM_SESS_TYPE_REMOTE
Date Time Interrogation Session: 20151117162543
MDC IDC SET ZONE DETECTION INTERVAL: 2000 ms
MDC IDC SET ZONE DETECTION INTERVAL: 3000 ms
Zone Setting Detection Interval: 370 ms

## 2014-07-07 NOTE — Progress Notes (Signed)
Loop recorder 

## 2014-07-07 NOTE — Telephone Encounter (Signed)
Called pt, left VM for her to call me back to discuss f/u MRI/MRA and visit with Deveshwar. JM

## 2014-07-08 DIAGNOSIS — I10 Essential (primary) hypertension: Secondary | ICD-10-CM | POA: Diagnosis not present

## 2014-07-08 DIAGNOSIS — I6931 Cognitive deficits following cerebral infarction: Secondary | ICD-10-CM | POA: Diagnosis not present

## 2014-07-08 DIAGNOSIS — I69354 Hemiplegia and hemiparesis following cerebral infarction affecting left non-dominant side: Secondary | ICD-10-CM | POA: Diagnosis not present

## 2014-07-08 DIAGNOSIS — E785 Hyperlipidemia, unspecified: Secondary | ICD-10-CM | POA: Diagnosis not present

## 2014-07-08 DIAGNOSIS — M47816 Spondylosis without myelopathy or radiculopathy, lumbar region: Secondary | ICD-10-CM | POA: Diagnosis not present

## 2014-07-11 DIAGNOSIS — I10 Essential (primary) hypertension: Secondary | ICD-10-CM | POA: Diagnosis not present

## 2014-07-11 DIAGNOSIS — I69354 Hemiplegia and hemiparesis following cerebral infarction affecting left non-dominant side: Secondary | ICD-10-CM | POA: Diagnosis not present

## 2014-07-11 DIAGNOSIS — E785 Hyperlipidemia, unspecified: Secondary | ICD-10-CM | POA: Diagnosis not present

## 2014-07-11 DIAGNOSIS — I6931 Cognitive deficits following cerebral infarction: Secondary | ICD-10-CM | POA: Diagnosis not present

## 2014-07-11 DIAGNOSIS — M47816 Spondylosis without myelopathy or radiculopathy, lumbar region: Secondary | ICD-10-CM | POA: Diagnosis not present

## 2014-07-13 DIAGNOSIS — I10 Essential (primary) hypertension: Secondary | ICD-10-CM | POA: Diagnosis not present

## 2014-07-13 DIAGNOSIS — I6931 Cognitive deficits following cerebral infarction: Secondary | ICD-10-CM | POA: Diagnosis not present

## 2014-07-13 DIAGNOSIS — E785 Hyperlipidemia, unspecified: Secondary | ICD-10-CM | POA: Diagnosis not present

## 2014-07-13 DIAGNOSIS — I69354 Hemiplegia and hemiparesis following cerebral infarction affecting left non-dominant side: Secondary | ICD-10-CM | POA: Diagnosis not present

## 2014-07-13 DIAGNOSIS — M47816 Spondylosis without myelopathy or radiculopathy, lumbar region: Secondary | ICD-10-CM | POA: Diagnosis not present

## 2014-07-18 ENCOUNTER — Telehealth: Payer: Self-pay | Admitting: *Deleted

## 2014-07-18 DIAGNOSIS — I10 Essential (primary) hypertension: Secondary | ICD-10-CM | POA: Diagnosis not present

## 2014-07-18 DIAGNOSIS — I6931 Cognitive deficits following cerebral infarction: Secondary | ICD-10-CM | POA: Diagnosis not present

## 2014-07-18 DIAGNOSIS — I69354 Hemiplegia and hemiparesis following cerebral infarction affecting left non-dominant side: Secondary | ICD-10-CM | POA: Diagnosis not present

## 2014-07-18 DIAGNOSIS — E785 Hyperlipidemia, unspecified: Secondary | ICD-10-CM | POA: Diagnosis not present

## 2014-07-18 DIAGNOSIS — M47816 Spondylosis without myelopathy or radiculopathy, lumbar region: Secondary | ICD-10-CM | POA: Diagnosis not present

## 2014-07-18 NOTE — Telephone Encounter (Signed)
Nurse calling to inform that pt had a fall, no injuries sustained

## 2014-07-19 DIAGNOSIS — I69354 Hemiplegia and hemiparesis following cerebral infarction affecting left non-dominant side: Secondary | ICD-10-CM | POA: Diagnosis not present

## 2014-07-19 DIAGNOSIS — E785 Hyperlipidemia, unspecified: Secondary | ICD-10-CM | POA: Diagnosis not present

## 2014-07-19 DIAGNOSIS — I6931 Cognitive deficits following cerebral infarction: Secondary | ICD-10-CM | POA: Diagnosis not present

## 2014-07-19 DIAGNOSIS — I10 Essential (primary) hypertension: Secondary | ICD-10-CM | POA: Diagnosis not present

## 2014-07-19 DIAGNOSIS — M47816 Spondylosis without myelopathy or radiculopathy, lumbar region: Secondary | ICD-10-CM | POA: Diagnosis not present

## 2014-07-20 DIAGNOSIS — I10 Essential (primary) hypertension: Secondary | ICD-10-CM | POA: Diagnosis not present

## 2014-07-20 DIAGNOSIS — E785 Hyperlipidemia, unspecified: Secondary | ICD-10-CM

## 2014-07-20 DIAGNOSIS — I69354 Hemiplegia and hemiparesis following cerebral infarction affecting left non-dominant side: Secondary | ICD-10-CM | POA: Diagnosis not present

## 2014-07-20 DIAGNOSIS — I6931 Cognitive deficits following cerebral infarction: Secondary | ICD-10-CM | POA: Diagnosis not present

## 2014-07-20 DIAGNOSIS — M47816 Spondylosis without myelopathy or radiculopathy, lumbar region: Secondary | ICD-10-CM | POA: Diagnosis not present

## 2014-07-21 ENCOUNTER — Telehealth: Payer: Self-pay | Admitting: *Deleted

## 2014-07-21 DIAGNOSIS — E785 Hyperlipidemia, unspecified: Secondary | ICD-10-CM | POA: Diagnosis not present

## 2014-07-21 DIAGNOSIS — I6931 Cognitive deficits following cerebral infarction: Secondary | ICD-10-CM | POA: Diagnosis not present

## 2014-07-21 DIAGNOSIS — M47816 Spondylosis without myelopathy or radiculopathy, lumbar region: Secondary | ICD-10-CM | POA: Diagnosis not present

## 2014-07-21 DIAGNOSIS — I10 Essential (primary) hypertension: Secondary | ICD-10-CM | POA: Diagnosis not present

## 2014-07-21 DIAGNOSIS — I69354 Hemiplegia and hemiparesis following cerebral infarction affecting left non-dominant side: Secondary | ICD-10-CM | POA: Diagnosis not present

## 2014-07-21 NOTE — Telephone Encounter (Signed)
Pt has been seen inpatient rehab, and is now receiving home health visits. Physical Therapist would like to get the process started to get the pt into outpatient PT/OT. Asking for referral

## 2014-07-25 ENCOUNTER — Encounter: Payer: Self-pay | Admitting: Physical Medicine & Rehabilitation

## 2014-07-25 ENCOUNTER — Encounter: Payer: Medicare Other | Attending: Physical Medicine & Rehabilitation

## 2014-07-25 ENCOUNTER — Ambulatory Visit (HOSPITAL_BASED_OUTPATIENT_CLINIC_OR_DEPARTMENT_OTHER): Payer: Medicare Other | Admitting: Physical Medicine & Rehabilitation

## 2014-07-25 VITALS — BP 132/72 | HR 87 | Resp 14

## 2014-07-25 DIAGNOSIS — I63311 Cerebral infarction due to thrombosis of right middle cerebral artery: Secondary | ICD-10-CM | POA: Diagnosis not present

## 2014-07-25 DIAGNOSIS — M7502 Adhesive capsulitis of left shoulder: Secondary | ICD-10-CM | POA: Diagnosis not present

## 2014-07-25 DIAGNOSIS — R414 Neurologic neglect syndrome: Secondary | ICD-10-CM | POA: Insufficient documentation

## 2014-07-25 DIAGNOSIS — G8110 Spastic hemiplegia affecting unspecified side: Secondary | ICD-10-CM

## 2014-07-25 NOTE — Progress Notes (Signed)
Subjective:    Patient ID: Mary Le, female    DOB: 01/21/1946, 69 y.o.   MRN: [id number]  HPI 69 y.o. RH female with h/o HTN, morbid obesity, dyslipidemia who was admitted on  06/03/2014 with left-sided weakness. Cranial CT scan was  negative and patient underwent CTA head/neck which revealed distal right M1 occlusion with question of low density right temporal, parietal lobe and insula.   She underwent endovacular revascularization of R-MCA with thrombectomy by Dr. Estanislado Pandy.  MRI of the brain shows acute infarct right MCA territory in the region of the insula, frontoparietal and high parietal region.  MRA of the brain with no stenosis or aneurysm and return of flow in R-MCA territory with luxury perfusion.  TEE showed normal LVF, no LAA, negative for PFO and loop recorder was placed by Dr. [name] on 11/14. She was started on dysphagia 2 with thin liquids. Patient with resultant L-HH, right gaze preference, left facial droop with mild dysarthria as well as right hemiparesis. Dr. Leonie Man recommended ASA for embolic infarct due to unknown etiology.  Admit date: 06/08/2014 Discharge date: 06/23/2014  [facility] with quad cane not using WC, Ambulates in the home with supervision.  Still requiring assistance for activities of daily living such as dressing and bathing  Discussed swelling on left side Left shoulder pain Pain Inventory Average Pain 5 Pain Right Now 4 My pain is intermittent and aching  In the last 24 hours, has pain interfered with the following? General activity 5 Relation with others 0 Enjoyment of life 5 What TIME of day is your pain at its worst? night Sleep (in general) Fair  Pain is worse with: walking, sitting and arm hanging Pain improves with: heat/ice and medication Relief from Meds: 7  Mobility walk with assistance use a cane how many minutes can you walk? 3-5 ability to climb steps?  yes do you drive?  no use a wheelchair Do you have any goals in this area?   yes  Function retired I need assistance with the following:  feeding, dressing, bathing, toileting, meal prep, household duties and shopping  Neuro/Psych spasms  Prior Studies Any changes since last visit?  no  Physicians involved in your care Any changes since last visit?  no   Family History  Problem Relation Age of Onset  . Coronary artery disease Mother 71    MI  . Kidney disease Father    History   Social History  . Marital Status: Married    Spouse Name: N/A    Number of Children: N/A  . Years of Education: N/A   Social History Main Topics  . Smoking status: Former Smoker -- 5 years  . Smokeless tobacco: Never Used  . Alcohol Use: Yes     Comment: twice a week. Wine or liquor   . Drug Use: No  . Sexual Activity: None   Other Topics Concern  . None   Social History Narrative   Lives in [place]    Married. Education: [employer].    Past Surgical History  Procedure Laterality Date  . Appendectomy    . Tonsillectomy    . Hemorroidectomy    . Tubal ligation    . Radiology with anesthesia N/A 06/03/2014    Procedure: RADIOLOGY WITH ANESTHESIA;  Surgeon: [name], MD;  Location: [place];  Service: Radiology;  Laterality: N/A;  . Tee without cardioversion N/A 06/07/2014    Procedure: TRANSESOPHAGEAL ECHOCARDIOGRAM (TEE);  Surgeon: [name], MD;  Location: [facility]  ENDOSCOPY;  Service: Cardiovascular;  Laterality: N/A;  . Loop recorder implant  06/07/2014    MDT LINQ implanted by Dr [name] for cryptogenic stroke  . Loop recorder implant N/A 06/07/2014    Procedure: LOOP RECORDER IMPLANT;  Surgeon: [name], MD;  Location: [place] CATH LAB;  Service: Cardiovascular;  Laterality: N/A;   Past Medical History  Diagnosis Date  . Hypertension   . Hypercholesteremia   . Back pain     arthritis  . CVA (cerebral infarction)    BP 132/72 mmHg  Pulse 87  Resp 14  SpO2 98%  Opioid Risk Score:   Fall Risk Score: Moderate Fall Risk (6-13 points)  (pt declined pamphlet during todays visit) Review of Systems  Neurological:       Spasms   All other systems reviewed and are negative.      Objective:   Physical Exam  Mild edema left hand, wrist, forearm, minimal in the foot and ankle area   Motor strength trace finger flexion biceps triceps 4 minus hip flexor knee extensor and ankle dorsiflexor. Left shoulder pain with external rotation forward flexion as well as abduction. No pain with hand and wrist range of motion  Modified Ashworth score to at the finger flexors and wrist flexors  Modified Ashworth if 3 at the plantar flexors left side  Sensation reduced to light touch in the left fingers and toes  Ambulates with a quad cane and supervision no evidence of toe drag or knee instability       Assessment & Plan:  1. Right MCA distribution infarct with left spastic hemiplegia. Recommend continue [place] PT OT  2.  Left frozen shoulder. Recommend corticosteroid injection. May also respond to Botox to pectoralis as well as biceps  Shoulder injection Left Without ultrasound guidance  Indication:Left Shoulder pain not relieved by medication management and other conservative care.  Informed consent was obtained after describing risks and benefits of the procedure with the patient, this includes bleeding, bruising, infection and medication side effects. The patient wishes to proceed and has given written consent. Patient was placed in a seated position. The Left shoulder was marked and prepped with betadine in the subacromial area. A 25-gauge 1-1/2 inch needle was inserted into the subacromial area. After negative draw back for blood, a solution containing 1 mL of 6 mg per ML betamethasone and 4 mL of 1% lidocaine was injected. A band aid was applied. The patient tolerated the procedure well. Post procedure instructions were given.   3. Spasticity scheduled for Botox injection biceps as well as pectoralis as well as pronator teres  muscle.300 units

## 2014-07-25 NOTE — Patient Instructions (Signed)
Shoulder injection will take full effect in 1-2 days. May need to repeat this one or 2Additional times  Botox injection for spasms as well as tightness in the left shoulder area

## 2014-07-25 NOTE — Telephone Encounter (Signed)
Refer to outpt Neuro rehab PT/OT

## 2014-07-26 DIAGNOSIS — E785 Hyperlipidemia, unspecified: Secondary | ICD-10-CM | POA: Diagnosis not present

## 2014-07-26 DIAGNOSIS — I10 Essential (primary) hypertension: Secondary | ICD-10-CM | POA: Diagnosis not present

## 2014-07-26 DIAGNOSIS — I69354 Hemiplegia and hemiparesis following cerebral infarction affecting left non-dominant side: Secondary | ICD-10-CM | POA: Diagnosis not present

## 2014-07-26 DIAGNOSIS — M47816 Spondylosis without myelopathy or radiculopathy, lumbar region: Secondary | ICD-10-CM | POA: Diagnosis not present

## 2014-07-26 DIAGNOSIS — I6931 Cognitive deficits following cerebral infarction: Secondary | ICD-10-CM | POA: Diagnosis not present

## 2014-07-27 ENCOUNTER — Telehealth: Payer: Self-pay | Admitting: *Deleted

## 2014-07-27 DIAGNOSIS — E785 Hyperlipidemia, unspecified: Secondary | ICD-10-CM | POA: Diagnosis not present

## 2014-07-27 DIAGNOSIS — M47816 Spondylosis without myelopathy or radiculopathy, lumbar region: Secondary | ICD-10-CM | POA: Diagnosis not present

## 2014-07-27 DIAGNOSIS — I10 Essential (primary) hypertension: Secondary | ICD-10-CM | POA: Diagnosis not present

## 2014-07-27 DIAGNOSIS — I69354 Hemiplegia and hemiparesis following cerebral infarction affecting left non-dominant side: Secondary | ICD-10-CM | POA: Diagnosis not present

## 2014-07-27 DIAGNOSIS — I6931 Cognitive deficits following cerebral infarction: Secondary | ICD-10-CM | POA: Diagnosis not present

## 2014-07-27 NOTE — Telephone Encounter (Signed)
Pt's daughter calling on pt's behalf asking for handicap parking placard, she is asking for a call back, pt has an appt with Dr. Letta Pate 08/22/2014

## 2014-07-28 DIAGNOSIS — E785 Hyperlipidemia, unspecified: Secondary | ICD-10-CM | POA: Diagnosis not present

## 2014-07-28 DIAGNOSIS — I6931 Cognitive deficits following cerebral infarction: Secondary | ICD-10-CM | POA: Diagnosis not present

## 2014-07-28 DIAGNOSIS — I69354 Hemiplegia and hemiparesis following cerebral infarction affecting left non-dominant side: Secondary | ICD-10-CM | POA: Diagnosis not present

## 2014-07-28 DIAGNOSIS — I10 Essential (primary) hypertension: Secondary | ICD-10-CM | POA: Diagnosis not present

## 2014-07-28 DIAGNOSIS — M47816 Spondylosis without myelopathy or radiculopathy, lumbar region: Secondary | ICD-10-CM | POA: Diagnosis not present

## 2014-07-28 NOTE — Telephone Encounter (Signed)
Corene Cornea see if you rcvd this referral. Thank you

## 2014-07-28 NOTE — Telephone Encounter (Signed)
Patient may have a handicap form filled out that they can take to [facility] I can sign tomorrow Please put on my desk

## 2014-07-29 NOTE — Telephone Encounter (Signed)
Placed on cart for signature. Notified [name] (daughter) that it will be available for pick up at front desk.

## 2014-08-01 ENCOUNTER — Encounter: Payer: Self-pay | Admitting: Family Medicine

## 2014-08-01 ENCOUNTER — Ambulatory Visit (INDEPENDENT_AMBULATORY_CARE_PROVIDER_SITE_OTHER): Payer: Medicare Other | Admitting: Family Medicine

## 2014-08-01 ENCOUNTER — Telehealth: Payer: Self-pay

## 2014-08-01 VITALS — BP 126/84 | HR 86 | Temp 98.0°F | Resp 16 | Ht 64.25 in | Wt 209.0 lb

## 2014-08-01 DIAGNOSIS — E785 Hyperlipidemia, unspecified: Secondary | ICD-10-CM | POA: Diagnosis not present

## 2014-08-01 DIAGNOSIS — M7989 Other specified soft tissue disorders: Secondary | ICD-10-CM

## 2014-08-01 DIAGNOSIS — L853 Xerosis cutis: Secondary | ICD-10-CM

## 2014-08-01 DIAGNOSIS — Z8669 Personal history of other diseases of the nervous system and sense organs: Secondary | ICD-10-CM | POA: Diagnosis not present

## 2014-08-01 DIAGNOSIS — Z8673 Personal history of transient ischemic attack (TIA), and cerebral infarction without residual deficits: Secondary | ICD-10-CM

## 2014-08-01 DIAGNOSIS — M79622 Pain in left upper arm: Secondary | ICD-10-CM | POA: Diagnosis not present

## 2014-08-01 DIAGNOSIS — I69354 Hemiplegia and hemiparesis following cerebral infarction affecting left non-dominant side: Secondary | ICD-10-CM | POA: Diagnosis not present

## 2014-08-01 DIAGNOSIS — I63311 Cerebral infarction due to thrombosis of right middle cerebral artery: Secondary | ICD-10-CM

## 2014-08-01 DIAGNOSIS — I6931 Cognitive deficits following cerebral infarction: Secondary | ICD-10-CM | POA: Diagnosis not present

## 2014-08-01 DIAGNOSIS — M47816 Spondylosis without myelopathy or radiculopathy, lumbar region: Secondary | ICD-10-CM | POA: Diagnosis not present

## 2014-08-01 DIAGNOSIS — I10 Essential (primary) hypertension: Secondary | ICD-10-CM | POA: Diagnosis not present

## 2014-08-01 NOTE — Patient Instructions (Addendum)
We will schedule the ultrasound of your leg in next 2 days.   We will clarify whether you should be on aspirin. Continue Plavix for now. If you do end up starting aspirin, and any stomach upset - let me know as may need to adjust dose.   Try lubriderm, eucerin or vaseline for dry skin areas. Let me know if this is not helping with the itching.   For sleep - temporarily can try low dose benadryl - start with 1/2 pill at bedtime as needed. This should improve as steroid from shoulder injection wears off.   Keep follow up with your other specialists and recheck with me in next 1 month. Plan on fasting bloodwork at that visit to look at cholesterol and discuss options for treatment.   Return to the clinic or go to the nearest emergency room if any of your symptoms worsen or new symptoms occur.  Drink at least 64 ounces of water daily. Consider a humidifier for the room where you sleep. Bathe once daily. Avoid using HOT water, as it dries skin.  Avoid deodorant soaps (Dial is the worst!) and stick with gentle cleansers (I like Cetaphil Liquid Cleanser for face). After bathing, dry off completely, then apply a thick emollient creamApply the cream twice daily, or more!

## 2014-08-01 NOTE — Progress Notes (Addendum)
Subjective:  This chart was scribed for Mary Agreste, MD by Mary Le, at [facility]. This patient was seen in room Room/bed info not found and the patient's care was started at 4:51 PM.   Patient ID: Mary Le, female    DOB: 10-22-1945, 69 y.o.   MRN: [id number]  HPI  HPI Comments: Mary Le is a 69 y.o. female who presents to [facility] for a hospital follow-up.    She has a history of hypertension and dyslipidemia admitted June 03 2014 with left sided weakness.  CTA of head and neck revealed distal right M1 occlusion.  She had a right MCA endovascular procedure with thrombectomy by Dr. Sallyanne Havers.  MRI of brain showed acute infarct of the right MCA territory and MRA of the brain showed return of blood flow in the right MCA territory.  TEE normal left ventricular function.  Negative PFO.  She had resultant left HH right gaze preference, left facial droop, mild dysarthria, right hemi paresis. Was continued on ASA  due to embolic infarct of unknown etiology.  Last eval with Dr. Letta Pate with RM and R on January 4th. At that time using a quad cane for ambulation and some assistance with ADLs of dressing and bathing.  She was noted to have a left frozen shoulder that was injected on the 4th with 6 mg of Beta Methasone.  Planning for botox injections for areas of spasticity.  She was continued on home health PT and OT.  Plan for four weeks follow up with Dr. Letta Pate.   Her last lipid panel in November had elevated LDL of 150 but she has been intolerant to Lipitor and Pravachol.  Lab Results  Component Value Date   CHOL 219* 06/04/2014   HDL 36* 06/04/2014   LDLCALC 150* 06/04/2014   TRIG 163* 06/04/2014   CHOLHDL 6.1 06/04/2014    Patient notes she has been having swelling on left leg and pain in her left shoulder. Last Monday she had a steroid shot on her shoulder and notes it has not loosened up yet but feels a bit better. Patient  notes she is not able to relax/sleep at night after the steroid shots.  Patients left leg was swollen during her hospital stay but notes that the swelling has worsened since she has left.  Per daughter, she states that they told her the swelling was due to lack of movement. She has been using a stationary bike to keep her legs moving at home.    She also notes of having dry skin along with itching. Patient has tried coconut butter,oil and Albolene to alleviate the itching and dryness but notes of no relief.  Patient uses water only to clean her face.    Patient notes of having an upset stomach when she takes aspirin but she was able to tolerate baby aspirin. She has never had bleeding from the stomach or ulcers.   She was admitted to hospital from Nov 13-18 and discharged to inpatient rehab from Nov 18- Dec 3rd. She has been home since that time.  Next appointment with Dr. Leonie Man as well as her botox injections is in February 2016.                             Patient Active Problem List   Diagnosis Date Noted  . Spastic hemiplegia affecting nondominant side 07/25/2014  . Spondylosis of lumbar  region without myelopathy or radiculopathy 06/20/2014  . Left hemiparesis 06/10/2014  . Left-sided neglect 06/10/2014  . 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 Morbid obesity- BMI 40 06/06/2014  . Acute respiratory failure with hypoxia 06/05/2014  . Cerebral infarction due to occlusion of right middle cerebral artery 06/03/2014  . Cerebral infarct 06/03/2014  . Altered mental status 06/03/2014  . GERD (gastroesophageal reflux disease) 04/12/2014  . Benign essential HTN 02/07/2014  . Dyslipidemia- statin intol 02/07/2014  . Chest pain- Low risk Myoview July 2015 02/06/2014   Past Medical History  Diagnosis Date  . Hypertension   . Hypercholesteremia   . Back pain     arthritis  . CVA (cerebral infarction)    Past Surgical History  Procedure  Laterality Date  . Appendectomy    . Tonsillectomy    . Hemorroidectomy    . Tubal ligation    . Radiology with anesthesia N/A 06/03/2014    Procedure: RADIOLOGY WITH ANESTHESIA;  Surgeon: [name], MD;  Location: [place];  Service: Radiology;  Laterality: N/A;  . Tee without cardioversion N/A 06/07/2014    Procedure: TRANSESOPHAGEAL ECHOCARDIOGRAM (TEE);  Surgeon: [name], MD;  Location[facility] ENDOSCOPY;  Service: Cardiovascular;  Laterality: N/A;  . Loop recorder implant  06/07/2014    MDT LINQ implanted by Dr [name] for cryptogenic stroke  . Loop recorder implant N/A 06/07/2014    Procedure: LOOP RECORDER IMPLANT;  Surgeon: [name], MD;  Location: [place] CATH LAB;  Service: Cardiovascular;  Laterality: N/A;   Allergies  Allergen Reactions  . Aspirin Nausea And Vomiting  . Statins Other (See Comments)    Myalgias and chest pain (has tried Lipitor and Pravachol)   Prior to Admission medications   Medication Sig Start Date End Date Taking? Authorizing Provider  acetaminophen (TYLENOL) 325 MG tablet Take 650 mg by mouth every 6 (six) hours as needed (pain).    Yes Historical Provider, MD  baclofen (LIORESAL) 20 MG tablet Take 1 tablet (20 mg total) by mouth 3 (three) times daily. 06/23/14  Yes [name] Love, PA-C  clopidogrel (PLAVIX) 75 MG tablet Take 1 tablet (75 mg total) by mouth daily. 06/23/14  Yes [name] Love, PA-C  famotidine (PEPCID) 10 MG tablet Take 1 tablet (10 mg total) by mouth 2 (two) times daily. 06/23/14  Yes [name] Love, PA-C  loratadine (ALAVERT) 10 MG tablet Take 10 mg by mouth daily as needed for allergies.   Yes Historical Provider, MD  methocarbamol (ROBAXIN) 500 MG tablet Take 1 tablet (500 mg total) by mouth every 8 (eight) hours as needed for muscle spasms. 06/23/14  Yes [name] Love, PA-C  metoprolol succinate (TOPROL-XL) 25 MG 24 hr tablet Take 0.5 tablets (12.5 mg total) by mouth daily. 06/23/14  Yes [name] Love, PA-C  Multiple Vitamin  (MULTIVITAMIN WITH MINERALS) TABS tablet Take 1 tablet by mouth daily.   Yes Historical Provider, MD  Polyethyl Glycol-Propyl Glycol (SYSTANE OP) Place 1 drop into both eyes daily as needed (dry eyeys).   Yes Historical Provider, MD  senna-docusate (SENOKOT-S) 8.6-50 MG per tablet Take 3 tablets by mouth 2 (two) times daily. 06/23/14  Yes [name] Love, PA-C  traMADol-acetaminophen (ULTRACET) 37.5-325 MG per tablet Take 1 tablet by mouth every 6 (six) hours as needed for moderate pain or severe pain. 06/23/14  Yes [name] Love, PA-C  B Complex Vitamins (B COMPLEX-B12 PO) Take 1 tablet by mouth daily.  Historical Provider, MD  calcium-vitamin D (OSCAL WITH D) 500-200 MG-UNIT per tablet Take 1 tablet by mouth 2 (two) times daily. Patient not taking: Reported on 08/01/2014 06/23/14   [name] Love, PA-C  fish oil-omega-3 fatty acids 1000 MG capsule Take 1 g by mouth daily.     Historical Provider, MD   History   Social History  . Marital Status: Married    Spouse Name: N/A    Number of Children: N/A  . Years of Education: N/A   Occupational History  . Not on file.   Social History Main Topics  . Smoking status: Former Smoker -- 5 years  . Smokeless tobacco: Never Used  . Alcohol Use: Yes     Comment: twice a week. Wine or liquor   . Drug Use: No  . Sexual Activity: Not on file   Other Topics Concern  . Not on file   Social History Narrative   Lives in [place]    Married. Education: [employer].      Review of Systems  Musculoskeletal: Positive for myalgias (left leg pain and left shoulder pain).  Skin:       Itching skin  Neurological: Positive for speech difficulty (subsequent to stroke. ) and weakness (subsequent to stroke. ).       Objective:   Physical Exam  Constitutional: She is oriented to person, place, and time. She appears well-developed and well-nourished.  HENT:  Head: Normocephalic and atraumatic.  Eyes: Conjunctivae and EOM are normal. Pupils are equal,  round, and reactive to light.  Neck: Carotid bruit is not present.  Cardiovascular: Normal rate, regular rhythm, normal heart sounds and intact distal pulses.   Pulmonary/Chest: Effort normal and breath sounds normal.  Abdominal: Soft. She exhibits no pulsatile midline mass. There is no tenderness.  Musculoskeletal:  Minimal tenderness along the left calf which appears to be more edematous than the right.  Circumference of left calf at 15 cm below the patella at 37 cm. Compared to right calf at 15 cm below the patella at 36 cm.    Left shoulder passive range of motion internal rotation in tact  External rotation to midline only pain with beyond that area.  flextion to approximately 30 degrees Some guarding with external rotation.     Neurological: She is alert and oriented to person, place, and time.  Minimal movement of left arm with lack of grip strength.   Skin: Skin is warm and dry.  Dry scaly skin on left lower leg greater than right as well as left upper arm.   Not seen on the right side.    Psychiatric: She has a normal mood and affect. Her behavior is normal.  Vitals reviewed.    Filed Vitals:   08/01/14 1629  BP: 126/84  Pulse: 86  Temp: 98 F (36.7 C)  TempSrc: Oral  Resp: 16  Height: 5' 4.25" (1.632 m)  Weight: 209 lb (94.802 kg)  SpO2: 98%        Assessment & Plan:  Mary Le is a 69 y.o. female Leg swelling, Calf swelling - Plan: US Venous Img Lower Unilateral Left  -reported L sided swelling since CVA that was thought to be due to non-use. Hx of embolic stroke without known source. Will check doppler, but less likely DVT. Currently on Plavix. Discuss swelling with neuro and PMandR next visit. rtc if increasing/worsening.   History of embolic stroke  -under care of PMandR, neuro, and home therapy.  Will need to  determine if should be on ASA as recommended prior or just PLavix. Suspect she will be able to tolerate at least 81mg  QD and possibly 325mg  QD as  intolerance in past was with more frequent use for pain. No known hx of PUD or true allergy - more intolerance by discussion in office  -hx of hyperlipidemia, but intolerant to multiple statins. Working on diet, but may need to consider other lipid lowering therapies.   - recheck in next month.   Dry skin  - sx care as below.  Eucerin, lubriderm.   Left upper arm pain  - bursitis vs frozen shoulder.  S/p injection and some improvement. Keep follow up with PM&R.   Insomnia - d/t steroid injection possibly. Can try low dose benadryl - SED. Let me know if sx's persist.   No orders of the defined types were placed in this encounter.   Patient Instructions  We will schedule the ultrasound of your leg in next 2 days.   We will clarify whether you should be on aspirin. Continue Plavix for now. If you do end up starting aspirin, and any stomach upset - let me know as may need to adjust dose.   Try lubriderm, eucerin or vaseline for dry skin areas. Let me know if this is not helping with the itching.   For sleep - temporarily can try low dose benadryl - start with 1/2 pill at bedtime as needed. This should improve as steroid from shoulder injection wears off.   Keep follow up with your other specialists and recheck with me in next 1 month. Plan on fasting bloodwork at that visit to look at cholesterol and discuss options for treatment.   Return to the clinic or go to the nearest emergency room if any of your symptoms worsen or new symptoms occur.  Drink at least 64 ounces of water daily. Consider a humidifier for the room where you sleep. Bathe once daily. Avoid using HOT water, as it dries skin.  Avoid deodorant soaps (Dial is the worst!) and stick with gentle cleansers (I like Cetaphil Liquid Cleanser for face). After bathing, dry off completely, then apply a thick emollient creamApply the cream twice daily, or more!      I personally performed the services described in this  documentation, which was scribed in my presence. The recorded information has been reviewed and considered, and addended by me as needed.

## 2014-08-01 NOTE — Telephone Encounter (Signed)
Korah from [place] wanted to let Dr Carlota Raspberry know patient will be discharged today from inpatient care. She will currently therapy on an outpatient basics. Call back number for Ashok Cordia is [phone]

## 2014-08-02 ENCOUNTER — Telehealth: Payer: Self-pay | Admitting: *Deleted

## 2014-08-02 ENCOUNTER — Encounter (HOSPITAL_COMMUNITY): Payer: Medicare Other

## 2014-08-02 ENCOUNTER — Other Ambulatory Visit (HOSPITAL_COMMUNITY): Payer: Self-pay | Admitting: Family Medicine

## 2014-08-02 DIAGNOSIS — M7989 Other specified soft tissue disorders: Secondary | ICD-10-CM

## 2014-08-02 NOTE — Telephone Encounter (Signed)
Spoke with pts daughter Mary Le and advised her on pt instructions per Dr. Vonna Kotyk recommendations from pts office vs on 08/01/14. Patient AVS was mailed to her home address.

## 2014-08-02 NOTE — Telephone Encounter (Signed)
Patient has been scheduled for Venous Doppler today at 2pm at [facility]. LM on cell phone with schedule information- asked for rtn call with questions.

## 2014-08-05 ENCOUNTER — Ambulatory Visit (INDEPENDENT_AMBULATORY_CARE_PROVIDER_SITE_OTHER): Payer: Medicare Other | Admitting: *Deleted

## 2014-08-05 DIAGNOSIS — I63411 Cerebral infarction due to embolism of right middle cerebral artery: Secondary | ICD-10-CM

## 2014-08-05 LAB — MDC_IDC_ENUM_SESS_TYPE_REMOTE

## 2014-08-09 ENCOUNTER — Ambulatory Visit
Payer: Medicare Other | Attending: Physical Medicine & Rehabilitation | Admitting: Rehabilitative and Restorative Service Providers"

## 2014-08-09 ENCOUNTER — Ambulatory Visit: Payer: Medicare Other | Admitting: Occupational Therapy

## 2014-08-09 ENCOUNTER — Encounter: Payer: Self-pay | Admitting: Occupational Therapy

## 2014-08-09 DIAGNOSIS — Z7409 Other reduced mobility: Secondary | ICD-10-CM | POA: Diagnosis not present

## 2014-08-09 DIAGNOSIS — H539 Unspecified visual disturbance: Secondary | ICD-10-CM | POA: Insufficient documentation

## 2014-08-09 DIAGNOSIS — R269 Unspecified abnormalities of gait and mobility: Secondary | ICD-10-CM | POA: Diagnosis not present

## 2014-08-09 DIAGNOSIS — G8114 Spastic hemiplegia affecting left nondominant side: Secondary | ICD-10-CM | POA: Insufficient documentation

## 2014-08-09 DIAGNOSIS — M25512 Pain in left shoulder: Secondary | ICD-10-CM | POA: Insufficient documentation

## 2014-08-09 DIAGNOSIS — R4189 Other symptoms and signs involving cognitive functions and awareness: Secondary | ICD-10-CM | POA: Diagnosis not present

## 2014-08-09 NOTE — Therapy (Signed)
[facility] [address] [place], [place], [address] Phone: [phone]   Fax:  [phone]  Occupational Therapy Evaluation  Patient Details  Name: Mary Le MRN: [id number] Date of Birth: 12/26/1945 Referring Provider:  Charlett Blake, MD  Encounter Date: 08/09/2014      OT End of Session - 08/09/14 1155    Visit Number 1   Number of Visits 16   Date for OT Re-Evaluation 10/04/14   Authorization Type medicare - needs G code!   OT Start Time 1100   OT Stop Time 1145   OT Time Calculation (min) 45 min   Activity Tolerance Patient tolerated treatment well      Past Medical History  Diagnosis Date  . Hypertension   . Hypercholesteremia   . Back pain     arthritis  . CVA (cerebral infarction)     Past Surgical History  Procedure Laterality Date  . Appendectomy    . Tonsillectomy    . Hemorroidectomy    . Tubal ligation    . Radiology with anesthesia N/A 06/03/2014    Procedure: RADIOLOGY WITH ANESTHESIA;  Surgeon: [name], MD;  Location: [facility];  Service: Radiology;  Laterality: N/A;  . Tee without cardioversion N/A 06/07/2014    Procedure: TRANSESOPHAGEAL ECHOCARDIOGRAM (TEE);  Surgeon: [name], MD;  Location: [facility] ENDOSCOPY;  Service: Cardiovascular;  Laterality: N/A;  . Loop recorder implant  06/07/2014    MDT LINQ implanted by Dr [name] for cryptogenic stroke  . Loop recorder implant N/A 06/07/2014    Procedure: LOOP RECORDER IMPLANT;  Surgeon: [name], MD;  Location: [facility] CATH LAB;  Service: Cardiovascular;  Laterality: N/A;    There were no vitals taken for this visit.  Visit Diagnosis:  Spastic hemiplegia affecting left nondominant side - Plan: Ot plan of care cert/re-cert  Pain in joint, shoulder region, left - Plan: Ot plan of care cert/re-cert  Impaired functional mobility and activity tolerance - Plan: Ot plan of care cert/re-cert  Impaired visual perception -  Plan: Ot plan of care cert/re-cert  Impaired cognition - Plan: Ot plan of care cert/re-cert      Subjective Assessment - 08/09/14 1104    Symptoms I am here to become more independent   Pertinent History see epic snapshot;  HTN, fluctuating BP since stroke   Currently in Pain? Yes   Pain Score 5   pt reports it can get as bad as a 10   Pain Location Shoulder   Pain Orientation Left   Pain Descriptors / Indicators Aching   Pain Type Chronic pain   Pain Onset More than a month ago   Pain Frequency Intermittent   Aggravating Factors  sleeping, sit up too long and "it hangs", therapy usually hurts   Pain Relieving Factors meds, ice, repositioning          [facility] OT Assessment - 08/09/14 1109    Assessment   Diagnosis R MCA CVA   Onset Date 06/04/15   Prior Therapy inpt rehab, home health therapies   Precautions   Precautions None   Restrictions   Weight Bearing Restrictions No   Balance Screen   Has the patient fallen in the past 6 months Yes  slide off shower seat and out of recliner chair   How many times? 2   Home  Environment   Family/patient expects to be discharged to: Private residence   Living Arrangements Spouse/significant other  and daughter  Available Help at Discharge Available 24 hours/day   Type of [place] One level   Bathroom Building control surveyor   Additional Comments uses shower seat with back   Prior Function   Level of Independence Independent with basic ADLs;Independent with homemaking with ambulation   Vocation Retired   ADL   Eating/Feeding Minimal assistance  needs assist with cutting   Grooming Set up  to put toothpaste on   Upper Body Bathing Minimal assistance  needs assist with back and RUE   Lower Body Bathing Minimal assistance  needs assist with buttocks, uses LH sponge   Upper Body Dressing Moderate assistance  needs assist to dress LUE and pull down shirt   Lower Body Dressing Moderate assistance  to don  pants on R leg/pull up. Unable to tie   Toilet Tranfer Supervision/safety  some unsteadiness at night occassionally   Toileting - Clothing Manipulation Maximal assistance  cannot consistently manage clothes up or down   Toileting -  Hygiene Moderate assistance   Tub/Shower Transfer Min guard   Tub/Shower Transfer: Patient Percentage 90%   ADL comments uses shoe horn, long handled sponge and shower seat.   IADL   Light Housekeeping Needs help with all home maintenance tasks  pt can vaccum her room with supervision   Meal Prep Needs to have meals prepared and served   [employer] on family or friends for transportation   Medication Management Is not capable of dispensing or managing own medication   Financial Management Dependent  husband does this; shared this before   Mobility   Mobility Status History of falls   Mobility Status Comments uses quad cane   Written Expression   Dominant Hand Right   Vision - History   Baseline Vision Wears glasses for distance only   Additional Comments states vision is a baseline.   Vision Assessment   Eye Alignment Impaired (comment)   Ocular Range of Motion Within Functional Limits   Tracking/Visual Pursuits Able to track stimulus in all quads without difficulty   Visual Fields --  L neglect   Activity Tolerance   Activity Tolerance Tolerates 10-20 min activity with muiltiple rests   Cognition   Attention --  impaired for attention to L side   Memory Impaired  to be further assessed   Awareness Appears intact  daughter reports overall safety is intact   Sensation   Light Touch Appears Intact   Hot/Cold Appears Intact   Proprioception Impaired by gross assessment   Coordination   Gross Motor Movements are Fluid and Coordinated No   Fine Motor Movements are Fluid and Coordinated Not tested   Coordination and Movement Description pt has no active movement in L hand   9 Hole Peg Test (p) unable   Box and Blocks unable    Coordination no active movement in L hand   Perception   Perception Impaired   Inattention/Neglect Does not attend to left visual field   Spatial Orientation impaired   Praxis   Praxis Not tested  to be further assessed   Edema   Edema moderate edema   Tone   Assessment Location Left Upper Extremity   AROM   Overall AROM Comments pt with some isolated AAROM for shoulder flexion/extension, shoulder abduction, and adduction, elbow flexion. Pt with superior subluxation   PROM   Overall PROM  Deficits   Overall PROM Comments Pt only tolerates approximately 45 degrees shoulder flexion and abduction, elbow  extension - 20 degrees, wrist extension -15 degrees   LUE Tone   LUE Tone Severe;Hypertonic  flexor tone   LUE Tone   Hypertonic Details throughout LUE   LUE Strength   Left Hand Gross Grasp Impaired  no active movement in L hand                         OT Short Term Goals - 08/09/14 1201    OT SHORT TERM GOAL #1   Title Pt and daughter will be I with HEP - 2/16/20126   Status New   OT SHORT TERM GOAL #2   Title Pt will require a with back only for UE body bathing in shower   Status New   OT [place] #3   Title Pt will be close s for LB  bathing in shower   Status New   OT SHORT TERM GOAL #4   Title  Pt will be min afor UB dressing with AE prn   Status New   OT SHORT TERM GOAL #5   Title Pt will be min a for LB dressing with AE prn   Status New   OT SHORT TERM GOAL #6   Title Pt will report no more than 4/10 pain in LUE with HEP   Status New   OT SHORT TERM GOAL #7   Title Pt will attend to L visual field during functional tasks with min vc's           OT Long Term Goals - 08/09/14 1205    OT LONG TERM GOAL #1   Title Pt and daughter will be mod I with updated HEP prn - 10/04/2014   Status New   OT LONG TERM GOAL #2   Title Pt will be supervision with simple meal prep at ambulatory level   Status New   OT LONG TERM GOAL #3   Title Pt  will be mod I with toilet hygiene   Status New   OT LONG TERM GOAL #4   Title Pt will be able to read 3 sentence paragraph with strategies with min vc's   Status New   OT LONG TERM GOAL #5   Title Pt and daughter will be able to identify 2 strategies to compensate for memory deficit.   Status New   Long Term Additional Goals   Additional Long Term Goals Yes   OT LONG TERM GOAL #6   Title Pt will report no greater than 3/10 pain in LUE with HEP   Status New   OT LONG TERM GOAL #7   Title Pt will demonstrate ability to use LUE as gross stabilizer with basic self care tasks   Status New               Plan - 08/09/14 1156    Clinical Impression Statement Pt is a 69 year old female s/p R MCA CVA. Pt was independent prior to the stroke. Pt currently presents with the following deficits which impede her ability to complete basic ADL's, simple IADL's, and leisure activities:  R spastic hemiplegia, decreased A/PROM, pain in L shoulder, impaired sensation, decreased functional use of LUE, decreased balance and impaired functional mobility, L neglect, impaired sensation.    Rehab Potential Good   Clinical Impairments Affecting Rehab Potential Pt will benefit from skilled OT services to address the above deficits.   OT Frequency 2x / week   OT Duration 8 weeks  OT Treatment/Interventions Self-care/ADL training;Ultrasound;Moist Heat;Fluidtherapy;Therapeutic exercise;Neuromuscular education;Energy conservation;Manual Therapy;Functional Mobility Training;DME and/or AE instruction;Passive range of motion;Visual/perceptual remediation/compensation;Cognitive remediation/compensation;Therapeutic activities;Splinting;Patient/family education;Balance training   Plan daugher to bring in [place] sling and home program - review and fit sling, intiate HEP if time   Consulted and Agree with Plan of Care Patient;Family member/caregiver   Family Member Consulted daugher          G-Codes - 09-06-2014 11-17-10     Functional Assessment Tool Used clinical observation and assessment - pt unable to do standardized tests   Functional Limitation Self care   Self Care Current Status [phone]) At least 80 percent but less than 100 percent impaired, limited or restricted   Self Care Goal Status ([id number]) At least 40 percent but less than 60 percent impaired, limited or restricted      Problem List Patient Active Problem List   Diagnosis Date Noted  . Spastic hemiplegia affecting nondominant side 07/25/2014  . 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 Left hemiparesis 06/10/2014  . Left-sided neglect 06/10/2014  . 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 Morbid obesity- BMI 40 06/06/2014  . Acute respiratory failure with hypoxia 06/05/2014  . Cerebral infarction due to occlusion of right middle cerebral artery 06/03/2014  . Cerebral infarct 06/03/2014  . Altered mental status 06/03/2014  . GERD (gastroesophageal reflux disease) 04/12/2014  . Benign essential HTN 02/07/2014  . Dyslipidemia- statin intol 02/07/2014  . Chest pain- Low risk Myoview July 2015 02/06/2014    Quay Burow, OTR/L 06-Sep-2014, 12:16 PM  [facility] [address] [place], [place], [address] Phone: [phone]   Fax:  [phone]

## 2014-08-09 NOTE — Therapy (Signed)
[facility] [address] [place], [place], [address] Phone: [phone]   Fax:  [phone]  Physical Therapy Evaluation  Patient Details  Name: Mary Le MRN: [id number] Date of Birth: 1945/10/03 Referring Provider:  Charlett Blake, MD  Encounter Date: 08/09/2014      PT End of Session - 08/09/14 1456    Visit Number 1  G code 1   Number of Visits 16   Date for PT Re-Evaluation 10/07/14   PT Start Time 1150   PT Stop Time 1242   PT Time Calculation (min) 52 min   Equipment Utilized During Treatment Gait belt   Activity Tolerance Patient tolerated treatment well   Behavior During Therapy [facility] for tasks assessed/performed      Past Medical History  Diagnosis Date  . Hypertension   . Hypercholesteremia   . Back pain     arthritis  . CVA (cerebral infarction)     Past Surgical History  Procedure Laterality Date  . Appendectomy    . Tonsillectomy    . Hemorroidectomy    . Tubal ligation    . Radiology with anesthesia N/A 06/03/2014    Procedure: RADIOLOGY WITH ANESTHESIA;  Surgeon: [name], MD;  Location: [facility];  Service: Radiology;  Laterality: N/A;  . Tee without cardioversion N/A 06/07/2014    Procedure: TRANSESOPHAGEAL ECHOCARDIOGRAM (TEE);  Surgeon: [name], MD;  Location: [facility] ENDOSCOPY;  Service: Cardiovascular;  Laterality: N/A;  . Loop recorder implant  06/07/2014    MDT LINQ implanted by Dr [name] for cryptogenic stroke  . Loop recorder implant N/A 06/07/2014    Procedure: LOOP RECORDER IMPLANT;  Surgeon: [name], MD;  Location: [facility] CATH LAB;  Service: Cardiovascular;  Laterality: N/A;    There were no vitals taken for this visit.  Visit Diagnosis:  Abnormality of gait - Plan: PT plan of care cert/re-cert      Subjective Assessment - 08/09/14 1149    Symptoms The patient reports since CVA, her main difficulties include: L shoulder pain, imbalance when  transferring onto/off of chairs (recliner and shower chair), being able to wipe herself after bowel movements, fatiguing easily, and imbalance.   Patient Stated Goals Improve overall mobility, increase independence.   Currently in Pain? Yes   Pain Score 5   varies and can increase to 10/10   Pain Location Shoulder   Pain Orientation Left   Pain Descriptors / Indicators Aching   Pain Type Chronic pain   Pain Onset More than a month ago   Pain Frequency Intermittent   Aggravating Factors  sleeping, sitting up and letting arm "hang"   Pain Relieving Factors meds, ice, repositioning   Multiple Pain Sites Yes   Pain Score 4   Pain Type Chronic pain   Pain Location Back   Pain Orientation Lower   Pain Radiating Towards R leg at times; worse with standing activities   Pain Descriptors / Indicators Aching   Pain Frequency Intermittent          [facility] PT Assessment - 08/09/14 1150    Assessment   Medical Diagnosis R MCA CVA   Onset Date 06/03/14   Next MD Visit 08/30/2014   Prior Therapy /2016   Precautions   Precautions None   Restrictions   Weight Bearing Restrictions No   Balance Screen   Has the patient fallen in the past 6 months Yes   How many times? --  3, all while  in a seated position or getting out of [employer] Private residence   Living Arrangements Spouse/significant other;Children;Other relatives  and grandchild with 24 hour supervision   Type of [place] to enter   Entrance Stairs-Number of Steps --  3   Entrance Stairs-Rails Right  negotiates steps to always use rail on [place] One level   [place] - quad;Toilet riser;Shower seat;Wheelchair - manual   Prior Function   Level of Independence Independent with basic ADLs;Independent with homemaking with ambulation   Vocation Retired   Observation/Other Assessments   Focus on Therapeutic Outcomes (FOTO)  40%   Other Surveys  --  SIS  mobility=33.3%   Sensation   Light Touch Appears Intact   AROM   Overall AROM  --  R UE/LE WFLs, L UE hemiplegic   Strength   Overall Strength --  R UE/LE 4/5 except knee flexion/ext 5/5   Overall Strength Comments --  L LE is 3-/5 hip flexion, 4/5 knee flex/ext, 3/5 ankle DF   Bed Mobility   Bed Mobility Supine to Sit;Sit to Supine   Ambulation/Gait   Ambulation/Gait Yes   Ambulation/Gait Assistance 5: Supervision   Ambulation Distance (Feet) --  200 ft   Gait Pattern --  Uses only 1 tip of quad cane, shuffling steps   Gait velocity 1.44 ft/sec indicating "limited community mobility"   Stairs Yes   Stairs Assistance 5: Supervision   Stair Management Technique One rail Right   Number of Stairs 4   Standardized Balance Assessment   Standardized Balance Assessment Berg Balance Test   Berg Balance Test   Sit to Stand Able to stand  independently using hands   Standing Unsupported Able to stand 30 seconds unsupported   Sitting with Back Unsupported but Feet Supported on Floor or Stool Able to sit safely and securely 2 minutes   Stand to Sit Controls descent by using hands   Transfers Able to transfer safely, definite need of hands   Standing Unsupported with Eyes Closed Able to stand 10 seconds with supervision   Standing Ubsupported with Feet Together Able to place feet together independently but unable to hold for 30 seconds   From Standing, Reach Forward with Outstretched Arm Reaches forward but needs supervision   From Standing Position, Pick up Object from Floor Able to pick up shoe, needs supervision   From Standing Position, Turn to Look Behind Over each Shoulder Turn sideways only but maintains balance   Turn 360 Degrees Needs assistance while turning   Standing Unsupported, Alternately Place Feet on Step/Stool Needs assistance to keep from falling or unable to try   Standing Unsupported, One Foot in Front Needs help to step but can hold 15 seconds   Standing on One Leg  Unable to try or needs assist to prevent fall   Total Score 27/56 indicating high fall risk                          PT Education - 08/09/14 1456    Education provided Yes   Education Details Discussed safe use of quad cane.   Person(s) Educated Patient   Methods Explanation   Comprehension Verbalized understanding;Returned demonstration          PT Short Term Goals - 08/10/14 0109    PT SHORT TERM GOAL #1   Title The patient will  return demo HEP indep with written cues.  Target date 09/09/2014   Time 4   Period Weeks   PT SHORT TERM GOAL #2   Title The patient will improve Berg score from 27/56 up to 35/56 to demo decreasing risk for falls.  Target date 09/09/2014   Time 4   Period Weeks   PT SHORT TERM GOAL #3   Title The patient will improve gait speed from 1.44 f t/sec up to 1.8 ft/sec to demo decreasing risk for falls.  Target date 09/09/2014   Time 4   Period Weeks   PT SHORT TERM GOAL #4   Title The patient will ambulate household surfaces without a device independently x 400 ft for improved independence in the home.  Target date 09/09/2014   Time 4   Period Weeks   PT SHORT TERM GOAL #5   Title The patient will move sit<>supine to the right and left sides independently.  Target date 09/09/2014   Time 4   Period Weeks           PT Long Term Goals - 08/10/14 0839    PT LONG TERM GOAL #1   Title The patient will verbalize understanding of CVA risk factors/warning signs.  [place] date 10/08/2014   Time 8   Period Weeks   PT LONG TERM GOAL #2   Title The patient will improve Berg score from 27/56 up to 40/56 to demo decreased risk for falls.  Target date 10/08/2014   Time 8   Period Weeks   PT LONG TERM GOAL #3   Title The patient will improve SIS: mobility from 33% up to 45%.  Target date 10/08/2014   Time 8   Period Weeks   PT LONG TERM GOAL #4   Title The patient will improve gait speed from 1.44 ft/sec up to 2.3 ft/sec to demo improving  functional ambulation. Target date 10/08/2014   Time 8   Period Weeks   PT LONG TERM GOAL #5   Title The patient will negotiate 4 steps modified indep with reciprocal pattern and one HR.   Target date 10/08/2014   Time 8   Period Weeks   Additional Long Term Goals   Additional Long Term Goals Yes   PT LONG TERM GOAL #6   Title The patient will negotiate level community surfaces without a device x 5 minutes independently for improved community mobility.  Target date 10/08/2014   Time 8   Period Weeks               Plan - 08/10/14 8921    Clinical Impression Statement The patient is a 69 yo female s/p CVA 06/03/2014.  She presents to OP physical therapy with limitations in functional mobility due to L LE weakness, decreased endurance, impaired balance and impaired gait.  PT to progress functional mobility to optimize current functional status.   Pt will benefit from skilled therapeutic intervention in order to improve on the following deficits Decreased balance;Difficulty walking;Postural dysfunction;Decreased strength;Decreased endurance;Decreased activity tolerance;Decreased mobility;Pain;Abnormal gait   Rehab Potential Good   PT Frequency 2x / week   PT Duration 8 weeks   PT Treatment/Interventions Gait training;Therapeutic exercise;Patient/family education;Balance training;Stair training;Neuromuscular re-education;Therapeutic activities   PT Next Visit Plan HEP: consider ankle dorsiflexion, marching, walking program, balance activities.  Progress gait without quad cane and balance activities.   Consulted and Agree with Plan of Care Patient;Family member/caregiver   Family Member Consulted daughter  G-Codes - 08/10/14 0843    Functional Assessment Tool Used Berg=27/56, Gait speed=1.44 ft/sec,stroke impact scale mobility=33.3%   Functional Limitation Mobility: Walking and moving around   Mobility: Walking and Moving Around Current Status [phone]) At least 40 percent but  less than 60 percent impaired, limited or restricted   Mobility: Walking and Moving Around Goal Status [phone]) At least 20 percent but less than 40 percent impaired, limited or restricted       Problem List Patient Active Problem List   Diagnosis Date Noted  . Spastic hemiplegia affecting nondominant side 07/25/2014  . 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 Left hemiparesis 06/10/2014  . Left-sided neglect 06/10/2014  . 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 Morbid obesity- BMI 40 06/06/2014  . Acute respiratory failure with hypoxia 06/05/2014  . Cerebral infarction due to occlusion of right middle cerebral artery 06/03/2014  . Cerebral infarct 06/03/2014  . Altered mental status 06/03/2014  . GERD (gastroesophageal reflux disease) 04/12/2014  . Benign essential HTN 02/07/2014  . Dyslipidemia- statin intol 02/07/2014  . Chest pain- Low risk Myoview July 2015 02/06/2014   Thank you for the referral of this patient.   [place], [place] 08/10/2014, 9:31 AM  [facility] [address] [place], [place], [address] Phone: [phone]   Fax:  [phone]

## 2014-08-09 NOTE — Progress Notes (Signed)
Loop recorder 

## 2014-08-11 ENCOUNTER — Telehealth: Payer: Self-pay | Admitting: *Deleted

## 2014-08-11 ENCOUNTER — Telehealth: Payer: Self-pay | Admitting: Rehabilitative and Restorative Service Providers"

## 2014-08-11 NOTE — Telephone Encounter (Signed)
Pt daughter states that she is doing a little better.  Her pain has gone down from a 8 to about a 5.  She has her leg elevated and it is swollen.  Advised that if weather permits we will schedule the venous doppler.

## 2014-08-11 NOTE — Telephone Encounter (Signed)
Spoke to [place]- advised that they need to call 911 to have mother transported to ED. [name] states they are calling now. She states she has watched her mother go downhill since this morning and is getting worse.

## 2014-08-11 NOTE — Telephone Encounter (Signed)
See other phone message - agree, needs to be seen. Called home number, unable to leave VM - mail box full.  Called provided cell number - left VM to call back if needed (if already being seen at ER - does not need to call back.

## 2014-08-11 NOTE — Telephone Encounter (Signed)
Patient's daughter called to ask PT what course of action should be taken b/c her mother is complaining of L sided pain and has LE swelling and is unable to walk.  PT recommended she phone patient's primary care MD.  She reports she does not think she can get her mother out of the house via w/c b/c of immobility.  PT recommended she call 911 if she doesn't hear back from MD quickly because this is a significant change in her mobility status (of walking with supervision at eval on 08/09/14).

## 2014-08-11 NOTE — Telephone Encounter (Signed)
Pt daughter called and states that after pt going to physical therapy she can barely get her to move.  She needs help getting to the bathroom and help to walk.  Before therapy she was able to do all these things.  Complaining of pain in legs and left arm.  No pain meds are helping.  Per Judson Roch pt needs to be seen.  Daughter cannot get pt in here to be seen because she cannot get up.  Advise to call EMS to transport her to hospital.

## 2014-08-11 NOTE — Telephone Encounter (Signed)
[name] calling for pt to speak with Clarise Cruz. Please advise. CB # [phone]

## 2014-08-14 NOTE — Telephone Encounter (Signed)
Spoke with daughter. Unable to drive today to go get a doppler. Gave daughter precautions and to call 911 if her leg gets worse or she gets SOB. Will call again tomorrow to check on status.

## 2014-08-15 ENCOUNTER — Ambulatory Visit: Payer: Medicare Other | Admitting: Occupational Therapy

## 2014-08-15 ENCOUNTER — Telehealth: Payer: Self-pay | Admitting: *Deleted

## 2014-08-15 ENCOUNTER — Ambulatory Visit: Payer: Medicare Other | Admitting: Physical Therapy

## 2014-08-15 NOTE — Telephone Encounter (Signed)
Spoke with patient's daughter to r/s 09/06/14 appointment, patient was r/s to 09/05/14. Daughter was also requesting a refill of Plavix, which was ordered by Dr Erling Cruz while patient was in the hospital, she states that patient has 7 days left of the medication.

## 2014-08-16 ENCOUNTER — Encounter: Payer: Medicare Other | Admitting: Occupational Therapy

## 2014-08-16 NOTE — Telephone Encounter (Signed)
Ok to refill plavix and keep appointment with me

## 2014-08-17 ENCOUNTER — Telehealth: Payer: Self-pay | Admitting: Family Medicine

## 2014-08-17 ENCOUNTER — Ambulatory Visit: Payer: Medicare Other | Admitting: Physical Therapy

## 2014-08-17 MED ORDER — CLOPIDOGREL BISULFATE 75 MG PO TABS: 75.0000 mg | ORAL_TABLET | Freq: Every day | ORAL | Status: DC

## 2014-08-17 NOTE — Telephone Encounter (Addendum)
Refill on Tramdol.   Please call daughter [name] when this is ready for pick up at pharmacy [phone]

## 2014-08-17 NOTE — Telephone Encounter (Signed)
Dr Greene please advise.  

## 2014-08-17 NOTE — Telephone Encounter (Signed)
There are 5 pharmacies listed in the chart.  I called back to verify which one they need Rx sent to.  She asked that we sent it to [facility].  Rx has been sent.

## 2014-08-17 NOTE — Telephone Encounter (Signed)
Jessica please refill patient's Plavix, thanks

## 2014-08-18 MED ORDER — TRAMADOL-ACETAMINOPHEN 37.5-325 MG PO TABS: 1.0000 | ORAL_TABLET | Freq: Four times a day (QID) | ORAL | Status: DC | PRN

## 2014-08-18 NOTE — Telephone Encounter (Signed)
Spoke to Daughter- Mary Le.  Pt refused to go to the ED- EMS did come and evaluate her- The EMS stated she did not need to be evaluated at the ED. Vital signs were stable. Assumption was made that PT/OT had worked pt leg too much and it was swelling from the increased activity. Please advise refill on Tramadol.   As of today- swelling is down, pain is controlled with the Tramadol. Pt is doing much better. BP has been very stable.    She wants to get the Doppler study- They would like an afternoon appt. Called [facility]- no appointments for tomorrow. Called [facility] has only on call today and we are not able to schedule an appt for a Stat study. Note has been made and this will be completed tomorrow.

## 2014-08-18 NOTE — Telephone Encounter (Signed)
Thanks for update. Glad to hear swelling is down and she is doing better. I can refill ultracet (this is what was listed in her meds, not ultram, so verify this is what she wanted a refill of) and will wait on ultrasound results to determine next follow up.

## 2014-08-18 NOTE — Telephone Encounter (Signed)
Faxed script to the [facility]. Daughter advised. Leaving message open in pool until Doppler is scheduled.

## 2014-08-18 NOTE — Telephone Encounter (Signed)
See prior messages and notes from last week. Check on status. Was she seen at an emergency room? Did she get doppler? Leg swelling still present?

## 2014-08-19 ENCOUNTER — Ambulatory Visit (HOSPITAL_COMMUNITY)
Admission: RE | Admit: 2014-08-19 | Discharge: 2014-08-19 | Disposition: A | Discharge status: Home / Self Care | Payer: Medicare Other | Source: Ambulatory Visit | Attending: Family Medicine | Admitting: Family Medicine

## 2014-08-19 DIAGNOSIS — M7989 Other specified soft tissue disorders: Secondary | ICD-10-CM | POA: Diagnosis not present

## 2014-08-19 NOTE — Telephone Encounter (Signed)
*  Preliminary Results* Left lower extremity venous duplex completed. Left lower extremity is negative for deep vein thrombosis. There is no evidence of left Baker's cyst.  08/19/2014 4:49 PM  Maudry Mayhew, RVT, RDCS, RDMS

## 2014-08-19 NOTE — Telephone Encounter (Signed)
[facility] does not have any openings for today- called [facility] and had to leave a message for a rtn call. Advised mrn/ STAT Doppler

## 2014-08-19 NOTE — Telephone Encounter (Signed)
Pt's daughter [name]) calling to follow regarding doppler for pt. Wants to know if this has been scheduled.   CB # [phone]

## 2014-08-19 NOTE — Telephone Encounter (Signed)
[facility] just returned call- appt was able to be scheduled at 4pm today. Mary Le notified and is getting ready to take her.

## 2014-08-19 NOTE — Telephone Encounter (Signed)
Noted. Please call patient and let her know negative for DVT. Continue PT, ultracet if needed, discuss these pains with PM&R provider at next appt and follow up with me in next 2 weeks. Sooner if worse.

## 2014-08-19 NOTE — Progress Notes (Signed)
*  Preliminary Results* Left lower extremity venous duplex completed. Left lower extremity is negative for deep vein thrombosis. There is no evidence of left Baker's cyst.  08/19/2014 4:49 PM  Maudry Mayhew, RVT, RDCS, RDMS

## 2014-08-20 NOTE — Telephone Encounter (Signed)
Pt's daughter notified.

## 2014-08-22 ENCOUNTER — Encounter: Payer: Self-pay | Admitting: Physical Medicine & Rehabilitation

## 2014-08-22 ENCOUNTER — Ambulatory Visit (HOSPITAL_BASED_OUTPATIENT_CLINIC_OR_DEPARTMENT_OTHER): Payer: Medicare Other | Admitting: Physical Medicine & Rehabilitation

## 2014-08-22 ENCOUNTER — Telehealth: Payer: Self-pay | Admitting: *Deleted

## 2014-08-22 ENCOUNTER — Other Ambulatory Visit: Payer: Self-pay | Admitting: *Deleted

## 2014-08-22 ENCOUNTER — Encounter: Payer: Medicare Other | Attending: Physical Medicine & Rehabilitation

## 2014-08-22 VITALS — BP 156/84 | HR 84 | Resp 14

## 2014-08-22 DIAGNOSIS — G8114 Spastic hemiplegia affecting left nondominant side: Secondary | ICD-10-CM | POA: Diagnosis not present

## 2014-08-22 DIAGNOSIS — G8110 Spastic hemiplegia affecting unspecified side: Secondary | ICD-10-CM | POA: Insufficient documentation

## 2014-08-22 DIAGNOSIS — I63311 Cerebral infarction due to thrombosis of right middle cerebral artery: Secondary | ICD-10-CM | POA: Insufficient documentation

## 2014-08-22 DIAGNOSIS — M7502 Adhesive capsulitis of left shoulder: Secondary | ICD-10-CM | POA: Insufficient documentation

## 2014-08-22 DIAGNOSIS — R414 Neurologic neglect syndrome: Secondary | ICD-10-CM | POA: Diagnosis not present

## 2014-08-22 MED ORDER — TIZANIDINE HCL 2 MG PO TABS: 2.0000 mg | ORAL_TABLET | Freq: Three times a day (TID) | ORAL | Status: DC

## 2014-08-22 NOTE — Patient Instructions (Addendum)
You received a Botox injection today. You may experience soreness at the needle injection sites. Please call us if any of the injection sites turns red after a couple days or if there is any drainage. You may experience muscle weakness as a result of Botox. This would improve with time but can take several weeks to improve. The Botox should start working in about one week. The Botox usually last 3 months. The injection can be repeated every 3 months as needed.   Started Zanaflex which is a stronger muscle relaxant then methocarbamol

## 2014-08-22 NOTE — Telephone Encounter (Signed)
Changed preferred pharmacy at patients request

## 2014-08-22 NOTE — Progress Notes (Signed)
   Botox Injection for spasticity using needle EMG guidance  Dilution: 50 Units/ml Indication: Severe spasticity which interferes with ADL,mobility and/or  hygiene and is unresponsive to medication management and other conservative care Informed consent was obtained after describing risks and benefits of the procedure with the patient. This includes bleeding, bruising, infection, excessive weakness, or medication side effects. A REMS form is on file and signed. Needle: 25g, 2in needle electrode Number of units per muscle Pectoralis100 Biceps100 FDS 50 FDP 50 All injections were done after obtaining appropriate EMG activity and after negative drawback for blood. The patient tolerated the procedure well. Post procedure instructions were given. A followup appointment was made.

## 2014-08-22 NOTE — Addendum Note (Signed)
Addended by: Geryl Rankins D on: 08/22/2014 03:54 PM   Modules accepted: Orders

## 2014-08-22 NOTE — Telephone Encounter (Signed)
Pt called, unable to p/u zanaflex rx, chart was reviewed, rx sent to mail order pharmacy, called pt, they prefer p/u at local pharmacy....i consulted with Sybil, she suggested we send a 2nd order with a 1 mo supply and the rest can come from mail order, rx sent electronically to [facility]

## 2014-08-24 ENCOUNTER — Encounter: Payer: Self-pay | Admitting: Physical Therapy

## 2014-08-24 ENCOUNTER — Ambulatory Visit: Payer: Medicare Other | Attending: Physical Medicine & Rehabilitation | Admitting: Physical Therapy

## 2014-08-24 ENCOUNTER — Ambulatory Visit: Payer: Medicare Other | Admitting: Physical Therapy

## 2014-08-24 ENCOUNTER — Ambulatory Visit: Payer: Medicare Other | Admitting: Occupational Therapy

## 2014-08-24 DIAGNOSIS — G8114 Spastic hemiplegia affecting left nondominant side: Secondary | ICD-10-CM

## 2014-08-24 DIAGNOSIS — R269 Unspecified abnormalities of gait and mobility: Secondary | ICD-10-CM | POA: Insufficient documentation

## 2014-08-24 DIAGNOSIS — Z7409 Other reduced mobility: Secondary | ICD-10-CM | POA: Diagnosis not present

## 2014-08-24 DIAGNOSIS — H539 Unspecified visual disturbance: Secondary | ICD-10-CM

## 2014-08-24 DIAGNOSIS — R4189 Other symptoms and signs involving cognitive functions and awareness: Secondary | ICD-10-CM | POA: Insufficient documentation

## 2014-08-24 DIAGNOSIS — M25512 Pain in left shoulder: Secondary | ICD-10-CM

## 2014-08-24 NOTE — Patient Instructions (Signed)
Wear giv Mohr sling when up walking around  Place both hands on a towel on the tabletop gently slide your arms forward and back 10-20 reps 1-2 x day  Slide towel side to side 10 -20 reps 1-2x day  Make circles on table top, stir the pot 10-20 reps 1-2 x day

## 2014-08-24 NOTE — Therapy (Signed)
[facility] [address] [place], [place], [address] Phone: [phone]   Fax:  [phone]  Physical Therapy Treatment  Patient Details  Name: Mary Le MRN: [id number] Date of Birth: 1946-03-30 Referring Provider:  [name], MD  Encounter Date: 08/24/2014      PT End of Session - 08/24/14 1527    Visit Number 2  G code 2   Number of Visits 16   Date for PT Re-Evaluation 10/07/14   PT Start Time 1450   PT Stop Time 1530   PT Time Calculation (min) 40 min   Equipment Utilized During Treatment Gait belt   Activity Tolerance Patient tolerated treatment well   Behavior During Therapy [facility] for tasks assessed/performed      Past Medical History  Diagnosis Date  . Hypertension   . Hypercholesteremia   . Back pain     arthritis  . CVA (cerebral infarction)     Past Surgical History  Procedure Laterality Date  . Appendectomy    . Tonsillectomy    . Hemorroidectomy    . Tubal ligation    . Radiology with anesthesia N/A 06/03/2014    Procedure: RADIOLOGY WITH ANESTHESIA;  Surgeon: [name], MD;  Location: [facility];  Service: Radiology;  Laterality: N/A;  . Tee without cardioversion N/A 06/07/2014    Procedure: TRANSESOPHAGEAL ECHOCARDIOGRAM (TEE);  Surgeon: [name], MD;  Location: [facility] ENDOSCOPY;  Service: Cardiovascular;  Laterality: N/A;  . Loop recorder implant  06/07/2014    MDT LINQ implanted by Dr [name] for cryptogenic stroke  . Loop recorder implant N/A 06/07/2014    Procedure: LOOP RECORDER IMPLANT;  Surgeon: [name], MD;  Location: [place] [facility] CATH LAB;  Service: Cardiovascular;  Laterality: N/A;    There were no vitals taken for this visit.  Visit Diagnosis:  Abnormality of gait  Impaired functional mobility and activity tolerance  Spastic hemiplegia affecting left nondominant side      Subjective Assessment - 08/24/14 1456    Symptoms No falls. Some discomfort in her  left hand. Order to continue with PT found in Dr. Vonna Kotyk most recent note. LE dopplar was negative for DVT. Pt reports left leg feels better, however still with some swelling.   Currently in Pain? Yes   Pain Score 4    Pain Location Hand   Pain Orientation Left   Pain Descriptors / Indicators Aching;Sore   Pain Type Chronic pain   Pain Onset More than a month ago   Pain Frequency Intermittent     Treatment: Issued HEP containing the following Exercise: - at counter with UE support as needed: heel/toe raises, alternating hip abduction and sit/stands x 10 reps each.  Neuro Re-ed: - at counter with UE support as needed: forward/backward marching, side stepping left/right, forward/backward toe walking, forward/backward tandem gait, all x 3 laps each. Single leg stance on left leg 10 sec x 3 reps.        PT Short Term Goals - 08/10/14 1093    PT SHORT TERM GOAL #1   Title The patient will return demo HEP indep with written cues.  Target date 09/09/2014   Time 4   Period Weeks   PT SHORT TERM GOAL #2   Title The patient will improve Berg score from 27/56 up to 35/56 to demo decreasing risk for falls.  Target date 09/09/2014   Time 4   Period Weeks   PT SHORT TERM GOAL #3  Title The patient will improve gait speed from 1.44 f t/sec up to 1.8 ft/sec to demo decreasing risk for falls.  Target date 09/09/2014   Time 4   Period Weeks   PT SHORT TERM GOAL #4   Title The patient will ambulate household surfaces without a device independently x 400 ft for improved independence in the home.  Target date 09/09/2014   Time 4   Period Weeks   PT SHORT TERM GOAL #5   Title The patient will move sit<>supine to the right and left sides independently.  Target date 09/09/2014   Time 4   Period Weeks           PT Long Term Goals - 08/10/14 0839    PT LONG TERM GOAL #1   Title The patient will verbalize understanding of CVA risk factors/warning signs.  TArget date 10/08/2014   Time 8    Period Weeks   PT LONG TERM GOAL #2   Title The patient will improve Berg score from 27/56 up to 40/56 to demo decreased risk for falls.  Target date 10/08/2014   Time 8   Period Weeks   PT LONG TERM GOAL #3   Title The patient will improve SIS: mobility from 33% up to 45%.  Target date 10/08/2014   Time 8   Period Weeks   PT LONG TERM GOAL #4   Title The patient will improve gait speed from 1.44 ft/sec up to 2.3 ft/sec to demo improving functional ambulation. Target date 10/08/2014   Time 8   Period Weeks   PT LONG TERM GOAL #5   Title The patient will negotiate 4 steps modified indep with reciprocal pattern and one HR.   Target date 10/08/2014   Time 8   Period Weeks   Additional Long Term Goals   Additional Long Term Goals Yes   PT LONG TERM GOAL #6   Title The patient will negotiate level community surfaces without a device x 5 minutes independently for improved community mobility.  Target date 10/08/2014   Time 8   Period Weeks          Plan - 08/24/14 1527    Clinical Impression Statement Pt now cleared by MD for no DVT and okay to continue PT. Issued HEP today for strengthening and balance.   Pt will benefit from skilled therapeutic intervention in order to improve on the following deficits Decreased balance;Difficulty walking;Postural dysfunction;Decreased strength;Decreased endurance;Decreased activity tolerance;Decreased mobility;Pain;Abnormal gait   Rehab Potential Good   PT Frequency 2x / week   PT Duration 8 weeks   PT Treatment/Interventions Gait training;Therapeutic exercise;Patient/family education;Balance training;Stair training;Neuromuscular re-education;Therapeutic activities   PT Next Visit Plan Progress gait without quad cane and balance activities.   PT Home Exercise Plan Issued HEP today.   Consulted and Agree with Plan of Care Patient;Family member/caregiver   Family Member Consulted daughter        Problem List Patient Active Problem List   Diagnosis  Date Noted  . Spastic hemiplegia affecting nondominant side 07/25/2014  . 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 Left hemiparesis 06/10/2014  . Left-sided neglect 06/10/2014  . 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 Morbid obesity- BMI 40 06/06/2014  . Acute respiratory failure with hypoxia 06/05/2014  . Cerebral infarction due to occlusion of right middle cerebral artery 06/03/2014  . Cerebral infarct 06/03/2014  . Altered mental status 06/03/2014  . GERD (gastroesophageal reflux disease) 04/12/2014  .  Benign essential HTN 02/07/2014  . Dyslipidemia- statin intol 02/07/2014  . Chest pain- Low risk Myoview July 2015 02/06/2014    Willow Ora 08/25/2014, 3:57 PM  Willow Ora, PTA, [facility] [address] [place], [place] [address] [phone] 08/25/2014, 3:57 PM

## 2014-08-24 NOTE — Patient Instructions (Signed)
Toe / Heel Raise (Standing)   Standing with support, raise heels. Hold 5 sec's. then rock back on heels and raise toes. Hold 5 seconds. Repeat _10___ times. Perform 1-2 times a day.  Copyright  VHI. All rights reserved.  Hip Side Kick   Holding counter for balance, keep legs shoulder width apart and toes pointed forward. Swing a leg out to side, keeping knee straight. Do not lean. Repeat using other leg, alternating legs. Repeat __10__ times each leg. Do _1-2___ sessions per day.  http://gt2.exer.us/343   Copyright  VHI. All rights reserved.  "I love a Art therapist for support with right arm: high knee marches forward and then backwards.  Repeat _3_ laps each way. Do _1-2___ sessions per day.  http://gt2.exer.us/345   Copyright  VHI. All rights reserved.  Walking on Toes   With counter for support: Walk on toes forward continuing on a straight path and then backwards. 3 laps each way. Do _1-2___ sessions per day.  Copyright  VHI. All rights reserved.  Side-Stepping   With counter for support: Walk to one side taking even steps, leading with same foot. Make sure each foot lifts off the floor. Repeat in other direction. Repeat 3 laps each way. Do _1-2__ sessions per day.   Copyright  VHI. All rights reserved.  Feet Heel-Toe "Tandem"   With counter support for right arm: walk a straight line forward bringing one foot directly in front of the other and then backward bringing one foot directly behind the other leg. Repeat for 3 laps each way. Do _1-2___ sessions per day.  Copyright  VHI. All rights reserved.  Single Leg - Eyes Open   Holding support, lift right leg while maintaining balance over other leg. Progress to removing hands from support surface for longer periods of time. Hold_10___ seconds. Repeat __3__ times per session. Do __1-2__ sessions per day.  Copyright  VHI. All rights reserved.  Functional Quadriceps: Sit to Stand   Sit on  edge of chair, feet flat on floor. Stand upright, extending knees fully. slowly sit back down, NO PLOP!!!  Repeat __5-10_ times.Do _1-2___ sessions per day.  http://orth.exer.us/735   Copyright  VHI. All rights reserved.

## 2014-08-25 NOTE — Therapy (Signed)
[facility] [address] [place], [place], [address] Phone: [phone]   Fax:  [phone]  Occupational Therapy Treatment  Patient Details  Name: Mary Le MRN: [id number] Date of Birth: 05/28/1946 Referring Provider:  [name], MD  Encounter Date: 08/24/2014      OT End of Session - 08/24/14 1620    Visit Number 2   Number of Visits 16   Date for OT Re-Evaluation 10/04/14   Authorization Type medicare - needs G code!   OT Start Time 1535   OT Stop Time 1615   OT Time Calculation (min) 40 min   Activity Tolerance Patient tolerated treatment well   Behavior During Therapy WFL for tasks assessed/performed      Past Medical History  Diagnosis Date  . Hypertension   . Hypercholesteremia   . Back pain     arthritis  . CVA (cerebral infarction)     Past Surgical History  Procedure Laterality Date  . Appendectomy    . Tonsillectomy    . Hemorroidectomy    . Tubal ligation    . Radiology with anesthesia N/A 06/03/2014    Procedure: RADIOLOGY WITH ANESTHESIA;  Surgeon: [name], MD;  Location: [facility] [facility];  Service: Radiology;  Laterality: N/A;  . Tee without cardioversion N/A 06/07/2014    Procedure: TRANSESOPHAGEAL ECHOCARDIOGRAM (TEE);  Surgeon: [name], MD;  Location: [facility] ENDOSCOPY;  Service: Cardiovascular;  Laterality: N/A;  . Loop recorder implant  06/07/2014    MDT LINQ implanted by Dr [name] for cryptogenic stroke  . Loop recorder implant N/A 06/07/2014    Procedure: LOOP RECORDER IMPLANT;  Surgeon: [name], MD;  Location: [facility] CATH LAB;  Service: Cardiovascular;  Laterality: N/A;    There were no vitals taken for this visit.  Visit Diagnosis:  Impaired functional mobility and activity tolerance  Spastic hemiplegia affecting left nondominant side  Pain in joint, shoulder region, left  Impaired visual perception  Impaired cognition      Subjective Assessment -  08/24/14 1535    Currently in Pain? Yes   Pain Score 3    Pain Location Arm   Pain Orientation Left   Pain Descriptors / Indicators Aching;Sore   Pain Type Chronic pain   Pain Onset More than a month ago   Pain Frequency Intermittent   Aggravating Factors  letting arm hang   Pain Relieving Factors meds,ice,reposistioning   Multiple Pain Sites No                         OT Education - 08/25/14 1620    Education provided Yes   Education Details Giv Mohr sling application and HEP, table slides   Person(s) Educated Patient;Child(ren)   Methods Explanation;Handout;Verbal cues;Demonstration   Comprehension Verbalized understanding;Returned demonstration;Verbal cues required;Need further instruction          OT Short Term Goals - 08/09/14 1201    OT SHORT TERM GOAL #1   Title Pt and daughter will be I with HEP - 2/16/20126   Status New   OT SHORT TERM GOAL #2   Title Pt will require a with back only for UE body bathing in shower   Status New   OT SHORT TERM GOAL #3   Title Pt will be close s for LB  bathing in shower   Status New   OT SHORT TERM GOAL #4   Title  Pt will be min afor  UB dressing with AE prn   Status New   OT SHORT TERM GOAL #5   Title Pt will be min a for LB dressing with AE prn   Status New   OT SHORT TERM GOAL #6   Title Pt will report no more than 4/10 pain in LUE with HEP   Status New   OT SHORT TERM GOAL #7   Title Pt will attend to L visual field during functional tasks with min vc's           OT Long Term Goals - 08/09/14 1205    OT LONG TERM GOAL #1   Title Pt and daughter will be mod I with updated HEP prn - 10/04/2014   Status New   OT LONG TERM GOAL #2   Title Pt will be supervision with simple meal prep at ambulatory level   Status New   OT LONG TERM GOAL #3   Title Pt will be mod I with toilet hygiene   Status New   OT LONG TERM GOAL #4   Title Pt will be able to read 3 sentence paragraph with strategies with min  vc's   Status New   OT LONG TERM GOAL #5   Title Pt and daughter will be able to identify 2 strategies to compensate for memory deficit.   Status New   Long Term Additional Goals   Additional Long Term Goals Yes   OT LONG TERM GOAL #6   Title Pt will report no greater than 3/10 pain in LUE with HEP   Status New   OT LONG TERM GOAL #7   Title Pt will demonstrate ability to use LUE as gross stabilizer with basic self care tasks   Status New     Treatment: Pt/ daughter were educated in give [facility] sling application and positioning to decrease UE pain. Pt./ daughter were instructed in HEP for tableslides, 20 reps each. Gentle P/ROM to left hand and wrist.           Plan - 08/25/14 1625    Clinical Impression Statement Pt can benefit from continued skilled occupational therapy to address LUE neuro-reed and positioning.   Rehab Potential Good   Clinical Impairments Affecting Rehab Potential Pt will benefit from skilled OT services to address the above deficits.   OT Treatment/Interventions Self-care/ADL training;Ultrasound;Moist Heat;Fluidtherapy;Therapeutic exercise;Neuromuscular education;Energy conservation;Manual Therapy;Functional Mobility Training;DME and/or AE instruction;Passive range of motion;Visual/perceptual remediation/compensation;Cognitive remediation/compensation;Therapeutic activities;Splinting;Patient/family education;Balance training   Plan review and progress HEP   OT Home Exercise Plan table slides   Consulted and Agree with Plan of Care Patient   Family Member Consulted daugher       Problem List Patient Active Problem List   Diagnosis Date Noted  . Spastic hemiplegia affecting nondominant side 07/25/2014  . 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 Left hemiparesis 06/10/2014  . Left-sided neglect 06/10/2014  . 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 Morbid obesity- BMI 40 06/06/2014  .  Acute respiratory failure with hypoxia 06/05/2014  . Cerebral infarction due to occlusion of right middle cerebral artery 06/03/2014  . Cerebral infarct 06/03/2014  . Altered mental status 06/03/2014  . GERD (gastroesophageal reflux disease) 04/12/2014  . Benign essential HTN 02/07/2014  . Dyslipidemia- statin intol 02/07/2014  . Chest pain- Low risk Myoview July 2015 02/06/2014    Chistopher Mangino 08/25/2014, 4:28 PM Theone Murdoch, OTR/L [phone] Phone: [phone] 4:28 PM 08/25/2014 [facility] [address] [place], [place], [address] Phone: [phone]   Fax:  [phone]

## 2014-08-26 ENCOUNTER — Ambulatory Visit: Payer: Medicare Other | Admitting: Occupational Therapy

## 2014-08-26 ENCOUNTER — Ambulatory Visit: Payer: Medicare Other | Admitting: Rehabilitative and Restorative Service Providers"

## 2014-08-26 DIAGNOSIS — R269 Unspecified abnormalities of gait and mobility: Secondary | ICD-10-CM | POA: Diagnosis not present

## 2014-08-26 DIAGNOSIS — Z7409 Other reduced mobility: Secondary | ICD-10-CM | POA: Diagnosis not present

## 2014-08-26 DIAGNOSIS — R4189 Other symptoms and signs involving cognitive functions and awareness: Secondary | ICD-10-CM

## 2014-08-26 DIAGNOSIS — H539 Unspecified visual disturbance: Secondary | ICD-10-CM | POA: Diagnosis not present

## 2014-08-26 DIAGNOSIS — G8114 Spastic hemiplegia affecting left nondominant side: Secondary | ICD-10-CM | POA: Diagnosis not present

## 2014-08-26 DIAGNOSIS — M25512 Pain in left shoulder: Secondary | ICD-10-CM

## 2014-08-26 NOTE — Patient Instructions (Signed)
Wear splint for 1-2 hours at a time initially during daytime, remove splint and check for pressure areas and redness that does not go away after 10-15 min, If no problems gradually increase wear time each day If no problems tolerating for 4-6 hrs during day, progress to nighttime wear If increased pain or pressure areas remove splint and Stop wearing Bring splint to your next appointment

## 2014-08-26 NOTE — Therapy (Signed)
[facility] [address] [place], [place], [address] Phone: [phone]   Fax:  [phone]  Occupational Therapy Treatment  Patient Details  Name: Mary Le MRN: [id number] Date of Birth: May 25, 1946 Referring Provider:  [name], MD  Encounter Date: 08/26/2014      OT End of Session - 08/26/14 0937    Visit Number 3   Number of Visits 16   Date for OT Re-Evaluation 10/04/14   Authorization Type medicare - needs G code!   Authorization - Visit Number 3   Authorization - Number of Visits 10   OT Start Time 0932   OT Stop Time 3474   OT Time Calculation (min) 43 min   Activity Tolerance Patient tolerated treatment well   Behavior During Therapy WFL for tasks assessed/performed      Past Medical History  Diagnosis Date  . Hypertension   . Hypercholesteremia   . Back pain     arthritis  . CVA (cerebral infarction)     Past Surgical History  Procedure Laterality Date  . Appendectomy    . Tonsillectomy    . Hemorroidectomy    . Tubal ligation    . Radiology with anesthesia N/A 06/03/2014    Procedure: RADIOLOGY WITH ANESTHESIA;  Surgeon: [name], MD;  Location: [facility];  Service: Radiology;  Laterality: N/A;  . Tee without cardioversion N/A 06/07/2014    Procedure: TRANSESOPHAGEAL ECHOCARDIOGRAM (TEE);  Surgeon: [name], MD;  Location: [facility] ENDOSCOPY;  Service: Cardiovascular;  Laterality: N/A;  . Loop recorder implant  06/07/2014    MDT LINQ implanted by Dr [name] for cryptogenic stroke  . Loop recorder implant N/A 06/07/2014    Procedure: LOOP RECORDER IMPLANT;  Surgeon: [name], MD;  Location: [facility] CATH LAB;  Service: Cardiovascular;  Laterality: N/A;    There were no vitals taken for this visit.  Visit Diagnosis:  Spastic hemiplegia affecting left nondominant side  Pain in joint, shoulder region, left  Impaired visual perception  Impaired cognition       Subjective Assessment - 08/26/14 0936    Currently in Pain? Yes   Pain Score 5    Pain Location Arm   Pain Orientation Left   Pain Descriptors / Indicators Aching   Pain Type Chronic pain   Pain Onset More than a month ago   Aggravating Factors  letting arm hand   Pain Relieving Factors meds, repositioning   Multiple Pain Sites No                         OT Education - 08/26/14 1321    Education provided Yes   Education Details resting hand splint wear, care and precautions   Person(s) Educated Patient;Child(ren)   Methods Explanation;Demonstration;Handout   Comprehension Verbalized understanding          OT Short Term Goals - 08/09/14 1201    OT SHORT TERM GOAL #1   Title Pt and daughter will be I with HEP - 2/16/20126   Status New   OT SHORT TERM GOAL #2   Title Pt will require a with back only for UE body bathing in shower   Status New   OT SHORT TERM GOAL #3   Title Pt will be close s for LB  bathing in shower   Status New   OT SHORT TERM GOAL #4   Title  Pt will be min afor UB dressing with  AE prn   Status New   OT SHORT TERM GOAL #5   Title Pt will be min a for LB dressing with AE prn   Status New   OT SHORT TERM GOAL #6   Title Pt will report no more than 4/10 pain in LUE with HEP   Status New   OT SHORT TERM GOAL #7   Title Pt will attend to L visual field during functional tasks with min vc's           OT Long Term Goals - 08/09/14 1205    OT LONG TERM GOAL #1   Title Pt and daughter will be mod I with updated HEP prn - 10/04/2014   Status New   OT LONG TERM GOAL #2   Title Pt will be supervision with simple meal prep at ambulatory level   Status New   OT LONG TERM GOAL #3   Title Pt will be mod I with toilet hygiene   Status New   OT LONG TERM GOAL #4   Title Pt will be able to read 3 sentence paragraph with strategies with min vc's   Status New   OT LONG TERM GOAL #5   Title Pt and daughter will be able to identify 2  strategies to compensate for memory deficit.   Status New   Long Term Additional Goals   Additional Long Term Goals Yes   OT LONG TERM GOAL #6   Title Pt will report no greater than 3/10 pain in LUE with HEP   Status New   OT LONG TERM GOAL #7   Title Pt will demonstrate ability to use LUE as gross stabilizer with basic self care tasks   Status New               Plan - 08/26/14 4174    Clinical Impression Statement Pt is progressing towards goals yet is limited by pain and spasticity.   Rehab Potential Good   Clinical Impairments Affecting Rehab Potential pain, spasticity   OT Frequency 2x / week   OT Duration 8 weeks   OT Treatment/Interventions Self-care/ADL training;Ultrasound;Moist Heat;Fluidtherapy;Therapeutic exercise;Neuromuscular education;Energy conservation;Manual Therapy;Functional Mobility Training;DME and/or AE instruction;Passive range of motion;Visual/perceptual remediation/compensation;Cognitive remediation/compensation;Therapeutic activities;Splinting;Patient/family education;Balance training   Plan splint check   OT Home Exercise Plan table slides   Consulted and Agree with Plan of Care Patient;Family member/caregiver   Family Member Consulted daugher     Treatment: Pt was fitted with a custom resting hand splint for decreased pain and increased postioning.  Pt performed tableslides and gentle self P/ROM supination/ pronation. Pt/ dtr were educated in splint wear and care.   Problem List Patient Active Problem List   Diagnosis Date Noted  . Spastic hemiplegia affecting nondominant side 07/25/2014  . 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 Left hemiparesis 06/10/2014  . Left-sided neglect 06/10/2014  . 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 Morbid obesity- BMI 40 06/06/2014  . Acute respiratory failure with hypoxia 06/05/2014  . Cerebral infarction due to occlusion of right middle  cerebral artery 06/03/2014  . Cerebral infarct 06/03/2014  . Altered mental status 06/03/2014  . GERD (gastroesophageal reflux disease) 04/12/2014  . Benign essential HTN 02/07/2014  . Dyslipidemia- statin intol 02/07/2014  . Chest pain- Low risk Myoview July 2015 02/06/2014    RINE,KATHRYN 08/26/2014, 1:25 PM  [facility] [address] [place], [place], [address] Phone: [phone]  Fax:  [phone]    

## 2014-08-26 NOTE — Therapy (Signed)
[facility] [address] [place], [place], [address] Phone: [phone]   Fax:  [phone]  Physical Therapy Treatment  Patient Details  Name: Mary Le MRN: [id number] Date of Birth: 04-10-1946 Referring Provider:  [name], MD  Encounter Date: 08/26/2014      PT End of Session - 08/26/14 1232    Visit Number 3  G code 3   Number of Visits 16   Date for PT Re-Evaluation 10/07/14   PT Start Time 1025   PT Stop Time 1100   PT Time Calculation (min) 35 min   Equipment Utilized During Treatment Gait belt   Activity Tolerance Patient tolerated treatment well   Behavior During Therapy [facility] for tasks assessed/performed      Past Medical History  Diagnosis Date  . Hypertension   . Hypercholesteremia   . Back pain     arthritis  . CVA (cerebral infarction)     Past Surgical History  Procedure Laterality Date  . Appendectomy    . Tonsillectomy    . Hemorroidectomy    . Tubal ligation    . Radiology with anesthesia N/A 06/03/2014    Procedure: RADIOLOGY WITH ANESTHESIA;  Surgeon: [name], MD;  Location: [facility];  Service: Radiology;  Laterality: N/A;  . Tee without cardioversion N/A 06/07/2014    Procedure: TRANSESOPHAGEAL ECHOCARDIOGRAM (TEE);  Surgeon: [name], MD;  Location: [facility] ENDOSCOPY;  Service: Cardiovascular;  Laterality: N/A;  . Loop recorder implant  06/07/2014    MDT LINQ implanted by Dr [name] for cryptogenic stroke  . Loop recorder implant N/A 06/07/2014    Procedure: LOOP RECORDER IMPLANT;  Surgeon: [name], MD;  Location: [facility] CATH LAB;  Service: Cardiovascular;  Laterality: N/A;    There were no vitals taken for this visit.  Visit Diagnosis:  Abnormality of gait      Subjective Assessment - 08/26/14 1027    Symptoms The patient reports she did 1/2 of her home exercises yesterday.     Currently in Pain? No/denies  leg was hurting when she woke up, none now      Gait: Ambulation x 78 ft, 100 ft, 75 ft and 100 ft without a device with cues on equal stride length and weight shifting. Pt requires close supervision for safety.  THERAPEUTIC EXERCISE: Supine L LE marching x 8 reps, trunk rotation and lumbar rocking within tolerable range of motion, seated L LE ankle dorsiflexion, seated L leg moving forward/backward, standing heel cord stretch with min A standing, sit<>stand x 3 reps. Bed mobility sit<>supine with min A       PT Short Term Goals - 08/10/14 6629    PT SHORT TERM GOAL #1   Title The patient will return demo HEP indep with written cues.  Target date 09/09/2014   Time 4   Period Weeks   PT SHORT TERM GOAL #2   Title The patient will improve Berg score from 27/56 up to 35/56 to demo decreasing risk for falls.  Target date 09/09/2014   Time 4   Period Weeks   PT SHORT TERM GOAL #3   Title The patient will improve gait speed from 1.44 f t/sec up to 1.8 ft/sec to demo decreasing risk for falls.  Target date 09/09/2014   Time 4   Period Weeks   PT SHORT TERM GOAL #4   Title The patient will ambulate household surfaces without a device independently x 400 ft for improved  independence in the home.  Target date 09/09/2014   Time 4   Period Weeks   PT SHORT TERM GOAL #5   Title The patient will move sit<>supine to the right and left sides independently.  Target date 09/09/2014   Time 4   Period Weeks           PT Long Term Goals - 08/10/14 0839    PT LONG TERM GOAL #1   Title The patient will verbalize understanding of CVA risk factors/warning signs.  TArget date 10/08/2014   Time 8   Period Weeks   PT LONG TERM GOAL #2   Title The patient will improve Berg score from 27/56 up to 40/56 to demo decreased risk for falls.  Target date 10/08/2014   Time 8   Period Weeks   PT LONG TERM GOAL #3   Title The patient will improve SIS: mobility from 33% up to 45%.  Target date 10/08/2014   Time 8   Period Weeks   PT LONG TERM GOAL #4    Title The patient will improve gait speed from 1.44 ft/sec up to 2.3 ft/sec to demo improving functional ambulation. Target date 10/08/2014   Time 8   Period Weeks   PT LONG TERM GOAL #5   Title The patient will negotiate 4 steps modified indep with reciprocal pattern and one HR.   Target date 10/08/2014   Time 8   Period Weeks   Additional Long Term Goals   Additional Long Term Goals Yes   PT LONG TERM GOAL #6   Title The patient will negotiate level community surfaces without a device x 5 minutes independently for improved community mobility.  Target date 10/08/2014   Time 8   Period Weeks           Plan - 08/26/14 1233    Clinical Impression Statement Patient fatigue limits activity in therapy.  PT to progress to patient tolerance working towards [place].   PT Next Visit Plan progress gait activities, endurance, bed mobility.   Consulted and Agree with Plan of Care Patient;Family member/caregiver   Family Member Consulted daughter        Problem List Patient Active Problem List   Diagnosis Date Noted  . Spastic hemiplegia affecting nondominant side 07/25/2014  . 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 Left hemiparesis 06/10/2014  . Left-sided neglect 06/10/2014  . 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 Morbid obesity- BMI 40 06/06/2014  . Acute respiratory failure with hypoxia 06/05/2014  . Cerebral infarction due to occlusion of right middle cerebral artery 06/03/2014  . Cerebral infarct 06/03/2014  . Altered mental status 06/03/2014  . GERD (gastroesophageal reflux disease) 04/12/2014  . Benign essential HTN 02/07/2014  . Dyslipidemia- statin intol 02/07/2014  . Chest pain- Low risk Myoview July 2015 02/06/2014    Khai Torbert , PT  08/26/2014, 12:34 PM  [facility] [address] [place], [place], [address] Phone: [phone]    Fax:  [phone]

## 2014-08-31 ENCOUNTER — Ambulatory Visit: Payer: Medicare Other | Admitting: Physical Therapy

## 2014-08-31 ENCOUNTER — Ambulatory Visit: Payer: Medicare Other | Admitting: Occupational Therapy

## 2014-08-31 ENCOUNTER — Encounter: Payer: Self-pay | Admitting: Physical Therapy

## 2014-08-31 DIAGNOSIS — R4189 Other symptoms and signs involving cognitive functions and awareness: Secondary | ICD-10-CM | POA: Diagnosis not present

## 2014-08-31 DIAGNOSIS — H539 Unspecified visual disturbance: Secondary | ICD-10-CM

## 2014-08-31 DIAGNOSIS — R269 Unspecified abnormalities of gait and mobility: Secondary | ICD-10-CM | POA: Diagnosis not present

## 2014-08-31 DIAGNOSIS — G8114 Spastic hemiplegia affecting left nondominant side: Secondary | ICD-10-CM | POA: Diagnosis not present

## 2014-08-31 DIAGNOSIS — Z7409 Other reduced mobility: Secondary | ICD-10-CM | POA: Diagnosis not present

## 2014-08-31 DIAGNOSIS — M25512 Pain in left shoulder: Secondary | ICD-10-CM | POA: Diagnosis not present

## 2014-08-31 NOTE — Therapy (Signed)
[facility] [address] [place], [place], [address] Phone: [phone]   Fax:  [phone] Physical Therapy Treatment  Patient Details  Name: Mary Le MRN: [id number] Date of Birth: [birth year] Referring Provider:  [name], MD  Encounter Date: 08/31/2014      PT End of Session - 08/31/14 1456    Visit Number 4  G code 4   Number of Visits 16   Date for PT Re-Evaluation 10/07/14   PT Start Time 1450   PT Stop Time 1530   PT Time Calculation (min) 40 min   Equipment Utilized During Treatment Gait belt   Activity Tolerance Patient tolerated treatment well   Behavior During Therapy [facility] for tasks assessed/performed      Past Medical History  Diagnosis Date  . Hypertension   . Hypercholesteremia   . Back pain     arthritis  . CVA (cerebral infarction)     Past Surgical History  Procedure Laterality Date  . Appendectomy    . Tonsillectomy    . Hemorroidectomy    . Tubal ligation    . Radiology with anesthesia N/A 06/03/2014    Procedure: RADIOLOGY WITH ANESTHESIA;  Surgeon: [name], MD;  Location: [facility];  Service: Radiology;  Laterality: N/A;  . Tee without cardioversion N/A 06/07/2014    Procedure: TRANSESOPHAGEAL ECHOCARDIOGRAM (TEE);  Surgeon: [name], MD;  Location: [facility] ENDOSCOPY;  Service: Cardiovascular;  Laterality: N/A;  . Loop recorder implant  06/07/2014    MDT LINQ implanted by Dr [name] for cryptogenic stroke  . Loop recorder implant N/A 06/07/2014    Procedure: LOOP RECORDER IMPLANT;  Surgeon: [name], MD;  Location: [facility] CATH LAB;  Service: Cardiovascular;  Laterality: N/A;    There were no vitals taken for this visit.  Visit Diagnosis:  Abnormality of gait  Impaired functional mobility and activity tolerance      Subjective Assessment - 08/31/14 1454    Symptoms No new complaints. No falls. Reports doing HEP at home without issues.    Currently in  Pain? Yes   Pain Score 3    Pain Location Arm   Pain Orientation Left   Pain Descriptors / Indicators Heaviness;Aching   Pain Type Chronic pain   Pain Onset More than a month ago   Pain Frequency Intermittent   Aggravating Factors  letting arm hang   Pain Relieving Factors meds, repositioning, use of arm sling     Treatment Gait - 230 ft, 345 ft without AD with min guard assist to supervision. Cues on posture, increased step/stride length and for weight shifting with gait.  Ther-activity Pt intitialy tried to lie straight back to supine from partial long sitting. Educated on energy needed to lie down this way and on the strain being placed on her back. Performed the following activities below to work on bed mobility for decreased pain and decreased care giver burdon - rolling right x 5 reps with cues and min assist to min guard assist for last 3 reps - rolling left x 5 reps with cues and min hand held assist to simulate bed rail pt has at home on this side of bed. - supine to/from sitting (using modified log roll technique as pt lies toward her weak side at home and is unable to lie completely onto her weak arm) x 3 reps with cues and min assist.   Exercise - Scifit x 3 extremities level 1.5 x 8 minutes  with goal rpm >/= 50 for strengthening and activity tolerance         PT Short Term Goals - 08/10/14 9244    PT SHORT TERM GOAL #1   Title The patient will return demo HEP indep with written cues.  Target date 09/09/2014   Time 4   Period Weeks   PT SHORT TERM GOAL #2   Title The patient will improve Berg score from 27/56 up to 35/56 to demo decreasing risk for falls.  Target date 09/09/2014   Time 4   Period Weeks   PT SHORT TERM GOAL #3   Title The patient will improve gait speed from 1.44 f t/sec up to 1.8 ft/sec to demo decreasing risk for falls.  Target date 09/09/2014   Time 4   Period Weeks   PT SHORT TERM GOAL #4   Title The patient will ambulate household surfaces  without a device independently x 400 ft for improved independence in the home.  Target date 09/09/2014   Time 4   Period Weeks   PT SHORT TERM GOAL #5   Title The patient will move sit<>supine to the right and left sides independently.  Target date 09/09/2014   Time 4   Period Weeks           PT Long Term Goals - 08/10/14 0839    PT LONG TERM GOAL #1   Title The patient will verbalize understanding of CVA risk factors/warning signs.  TArget date 10/08/2014   Time 8   Period Weeks   PT LONG TERM GOAL #2   Title The patient will improve Berg score from 27/56 up to 40/56 to demo decreased risk for falls.  Target date 10/08/2014   Time 8   Period Weeks   PT LONG TERM GOAL #3   Title The patient will improve SIS: mobility from 33% up to 45%.  Target date 10/08/2014   Time 8   Period Weeks   PT LONG TERM GOAL #4   Title The patient will improve gait speed from 1.44 ft/sec up to 2.3 ft/sec to demo improving functional ambulation. Target date 10/08/2014   Time 8   Period Weeks   PT LONG TERM GOAL #5   Title The patient will negotiate 4 steps modified indep with reciprocal pattern and one HR.   Target date 10/08/2014   Time 8   Period Weeks   Additional Long Term Goals   Additional Long Term Goals Yes   PT LONG TERM GOAL #6   Title The patient will negotiate level community surfaces without a device x 5 minutes independently for improved community mobility.  Target date 10/08/2014   Time 8   Period Weeks           Plan - 08/31/14 1456    Clinical Impression Statement Pt making steady progress toward goals. Increased activity tolerance today with session.   Pt will benefit from skilled therapeutic intervention in order to improve on the following deficits Decreased balance;Difficulty walking;Postural dysfunction;Decreased strength;Decreased endurance;Decreased activity tolerance;Decreased mobility;Pain;Abnormal gait   Rehab Potential Good   PT Frequency 2x / week   PT Duration 8  weeks   PT Treatment/Interventions Gait training;Therapeutic exercise;Patient/family education;Balance training;Stair training;Neuromuscular re-education;Therapeutic activities   PT Next Visit Plan Progress gait without quad cane and balance activities.   PT Home Exercise Plan Issued HEP today.   Consulted and Agree with Plan of Care Patient;Family member/caregiver   Family Member Consulted daughter  Problem List Patient Active Problem List   Diagnosis Date Noted  . Spastic hemiplegia affecting nondominant side 07/25/2014  . 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 Left hemiparesis 06/10/2014  . Left-sided neglect 06/10/2014  . 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 Morbid obesity- BMI 40 06/06/2014  . Acute respiratory failure with hypoxia 06/05/2014  . Cerebral infarction due to occlusion of right middle cerebral artery 06/03/2014  . Cerebral infarct 06/03/2014  . Altered mental status 06/03/2014  . GERD (gastroesophageal reflux disease) 04/12/2014  . Benign essential HTN 02/07/2014  . Dyslipidemia- statin intol 02/07/2014  . Chest pain- Low risk Myoview July 2015 02/06/2014    Willow Ora 09/01/2014, 1:09 PM  Willow Ora, PTA, [facility] [address] [place], [place] [address] [phone] 09/01/2014, 1:09 PM

## 2014-08-31 NOTE — Patient Instructions (Signed)
Edema glove Put on your edema glove after performing retrograde massage to left hand, you may wear the edema glove most of the time when not bathing  Remove glove if increased swelling or pain You may wear glove with splint. Hand wash glove only

## 2014-09-01 ENCOUNTER — Telehealth: Payer: Self-pay

## 2014-09-01 ENCOUNTER — Encounter: Payer: Self-pay | Admitting: Internal Medicine

## 2014-09-01 NOTE — Therapy (Signed)
[facility] [address] [place], [place], [address] Phone: [phone]   Fax:  [phone]  Occupational Therapy Treatment  Patient Details  Name: Mary Le MRN: [id number] Date of Birth: [birth year] Referring Provider:  [name], MD  Encounter Date: 08/31/2014      OT End of Session - 08/31/14 1702    Visit Number 4   Number of Visits 16   Date for OT Re-Evaluation 10/04/14   Authorization Type medicare - needs G code!   Authorization - Visit Number 4   Authorization - Number of Visits 10   OT Start Time 1540   OT Stop Time 1615   OT Time Calculation (min) 35 min   Activity Tolerance Patient tolerated treatment well   Behavior During Therapy WFL for tasks assessed/performed      Past Medical History  Diagnosis Date  . Hypertension   . Hypercholesteremia   . Back pain     arthritis  . CVA (cerebral infarction)     Past Surgical History  Procedure Laterality Date  . Appendectomy    . Tonsillectomy    . Hemorroidectomy    . Tubal ligation    . Radiology with anesthesia N/A 06/03/2014    Procedure: RADIOLOGY WITH ANESTHESIA;  Surgeon: [name], MD;  Location: [facility];  Service: Radiology;  Laterality: N/A;  . Tee without cardioversion N/A 06/07/2014    Procedure: TRANSESOPHAGEAL ECHOCARDIOGRAM (TEE);  Surgeon: [name], MD;  Location: [facility] ENDOSCOPY;  Service: Cardiovascular;  Laterality: N/A;  . Loop recorder implant  06/07/2014    MDT LINQ implanted by Dr [name] for cryptogenic stroke  . Loop recorder implant N/A 06/07/2014    Procedure: LOOP RECORDER IMPLANT;  Surgeon: [name], MD;  Location: [facility] CATH LAB;  Service: Cardiovascular;  Laterality: N/A;    There were no vitals taken for this visit.  Visit Diagnosis:  Impaired functional mobility and activity tolerance  Spastic hemiplegia affecting left nondominant side  Pain in joint, shoulder region, left  Impaired  visual perception  Impaired cognition      Subjective Assessment - 09/01/14 1958    Currently in Pain? Yes   Pain Score 3    Pain Location Arm   Pain Orientation Left   Pain Descriptors / Indicators Heaviness;Aching   Pain Type Chronic pain   Pain Onset More than a month ago   Pain Frequency Intermittent   Aggravating Factors  letting arm hang   Pain Relieving Factors meds repostioning and arm sling   Multiple Pain Sites No                         OT Education - 09/01/14 1959    Education Details retrograde massage, edema glove wear/ care, prec.   Person(s) Educated Patient   Methods Explanation;Verbal cues;Handout   Comprehension Verbalized understanding          OT Short Term Goals - 08/09/14 1201    OT SHORT TERM GOAL #1   Title Pt and daughter will be I with HEP - 2/16/20126   Status New   OT SHORT TERM GOAL #2   Title Pt will require a with back only for UE body bathing in shower   Status New   OT SHORT TERM GOAL #3   Title Pt will be close s for LB  bathing in shower   Status New   OT [place] #4  Title  Pt will be min afor UB dressing with AE prn   Status New   OT SHORT TERM GOAL #5   Title Pt will be min a for LB dressing with AE prn   Status New   OT SHORT TERM GOAL #6   Title Pt will report no more than 4/10 pain in LUE with HEP   Status New   OT SHORT TERM GOAL #7   Title Pt will attend to L visual field during functional tasks with min vc's           OT Long Term Goals - 08/09/14 1205    OT LONG TERM GOAL #1   Title Pt and daughter will be mod I with updated HEP prn - 10/04/2014   Status New   OT LONG TERM GOAL #2   Title Pt will be supervision with simple meal prep at ambulatory level   Status New   OT LONG TERM GOAL #3   Title Pt will be mod I with toilet hygiene   Status New   OT LONG TERM GOAL #4   Title Pt will be able to read 3 sentence paragraph with strategies with min vc's   Status New   OT LONG TERM  GOAL #5   Title Pt and daughter will be able to identify 2 strategies to compensate for memory deficit.   Status New   Long Term Additional Goals   Additional Long Term Goals Yes   OT LONG TERM GOAL #6   Title Pt will report no greater than 3/10 pain in LUE with HEP   Status New   OT LONG TERM GOAL #7   Title Pt will demonstrate ability to use LUE as gross stabilizer with basic self care tasks   Status New    Treatment:Retrograde massage and gentel finger flexion/ extension P/ROM performed with good results. Pt performed tableslides for increased shoulder flexion, abduction followed by self ROM passive supination/ pronation. Pt was issued an edema glove and pt/ husband were instructed in wear.            Plan - 09/01/14 2001    Clinical Impression Statement Pt is progressing towards goals.Sshe reports decreased LUE pain and stiffness.   Rehab Potential Good   Clinical Impairments Affecting Rehab Potential pain, spasticity   OT Frequency 2x / week   OT Duration 8 weeks   OT Treatment/Interventions Self-care/ADL training;Ultrasound;Moist Heat;Fluidtherapy;Therapeutic exercise;Neuromuscular education;Energy conservation;Manual Therapy;Functional Mobility Training;DME and/or AE instruction;Passive range of motion;Visual/perceptual remediation/compensation;Cognitive remediation/compensation;Therapeutic activities;Splinting;Patient/family education;Balance training   Plan assess edema glove,check STG's progress HEP.   OT Home Exercise Plan table slides   Consulted and Agree with Plan of Care Patient;Family member/caregiver   Family Member Consulted husband        Problem List Patient Active Problem List   Diagnosis Date Noted  . Spastic hemiplegia affecting nondominant side 07/25/2014  . 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 Left hemiparesis 06/10/2014  . Left-sided neglect 06/10/2014  . 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 Morbid obesity- BMI 40 06/06/2014  . Acute respiratory failure with hypoxia 06/05/2014  . Cerebral infarction due to occlusion of right middle cerebral artery 06/03/2014  . Cerebral infarct 06/03/2014  . Altered mental status 06/03/2014  . GERD (gastroesophageal reflux disease) 04/12/2014  . Benign essential HTN 02/07/2014  . Dyslipidemia- statin intol 02/07/2014  . Chest pain- Low risk Myoview July 2015 02/06/2014    RINE,KATHRYN 09/01/2014, 8:05  PM Theone Murdoch, OTR/L [phone] Phone: [phone] 8:05 PM 09/01/2014 [facility] [address] [place], [place], [address] Phone: [phone]   Fax:  [phone]

## 2014-09-01 NOTE — Telephone Encounter (Signed)
Pt called wanting a refill on traMADol-acetaminophen (ULTRACET) 37.5-325 MG per tablet [[id number]]. Please advise at [phone]

## 2014-09-02 ENCOUNTER — Ambulatory Visit: Payer: Medicare Other | Admitting: Rehabilitative and Restorative Service Providers"

## 2014-09-02 ENCOUNTER — Other Ambulatory Visit: Payer: Self-pay

## 2014-09-02 ENCOUNTER — Ambulatory Visit: Payer: Medicare Other | Admitting: Occupational Therapy

## 2014-09-02 MED ORDER — TRAMADOL-ACETAMINOPHEN 37.5-325 MG PO TABS: 1.0000 | ORAL_TABLET | Freq: Four times a day (QID) | ORAL | Status: DC | PRN

## 2014-09-02 NOTE — Telephone Encounter (Signed)
Rx for Ultracet sent.

## 2014-09-02 NOTE — Telephone Encounter (Signed)
Pt requesting refill on Tramadol. Also states that the new medication tiZANidine (ZANAFLEX) 2 MG tablet [[id number]]  Is making her itch. Please advise

## 2014-09-02 NOTE — Telephone Encounter (Signed)
Tizanidine prescribed by Dr. Letta Pate. Call his office today to discuss that medicine. I can refill the ultracet for now, but was also to discuss pains with Dr. Letta Pate based on last phone message in January.

## 2014-09-05 ENCOUNTER — Ambulatory Visit (INDEPENDENT_AMBULATORY_CARE_PROVIDER_SITE_OTHER): Payer: Medicare Other | Admitting: *Deleted

## 2014-09-05 ENCOUNTER — Ambulatory Visit: Payer: Self-pay | Admitting: Neurology

## 2014-09-05 ENCOUNTER — Other Ambulatory Visit: Payer: Self-pay | Admitting: *Deleted

## 2014-09-05 ENCOUNTER — Telehealth: Payer: Self-pay | Admitting: *Deleted

## 2014-09-05 DIAGNOSIS — I63411 Cerebral infarction due to embolism of right middle cerebral artery: Secondary | ICD-10-CM | POA: Diagnosis not present

## 2014-09-05 LAB — MDC_IDC_ENUM_SESS_TYPE_REMOTE

## 2014-09-05 NOTE — Telephone Encounter (Signed)
Daughter called on pt's behalf, states that pt is having a bad reaction to tizanidine that is causing her to itch uncontrollably, wondering if they can switch back to robaxin or baclofen. Also pt needs a refill on her tramadol rx. Asking for call back

## 2014-09-05 NOTE — Telephone Encounter (Signed)
D/c tizanidine, call in Robaxin 500mg  TID PRN #90 1 RF

## 2014-09-05 NOTE — Telephone Encounter (Signed)
Called the rx into pt's preferred pharmacy, called pt and notified

## 2014-09-05 NOTE — Telephone Encounter (Signed)
error 

## 2014-09-06 ENCOUNTER — Telehealth: Payer: Self-pay

## 2014-09-06 ENCOUNTER — Ambulatory Visit: Payer: Medicare Other | Admitting: Rehabilitative and Restorative Service Providers"

## 2014-09-06 ENCOUNTER — Ambulatory Visit: Payer: Medicare Other | Admitting: Occupational Therapy

## 2014-09-06 ENCOUNTER — Ambulatory Visit: Payer: Medicare Other | Admitting: Neurology

## 2014-09-06 ENCOUNTER — Telehealth: Payer: Self-pay | Admitting: *Deleted

## 2014-09-06 DIAGNOSIS — G8114 Spastic hemiplegia affecting left nondominant side: Secondary | ICD-10-CM

## 2014-09-06 DIAGNOSIS — Z7409 Other reduced mobility: Secondary | ICD-10-CM

## 2014-09-06 DIAGNOSIS — M25512 Pain in left shoulder: Secondary | ICD-10-CM | POA: Diagnosis not present

## 2014-09-06 DIAGNOSIS — R269 Unspecified abnormalities of gait and mobility: Secondary | ICD-10-CM

## 2014-09-06 DIAGNOSIS — H539 Unspecified visual disturbance: Secondary | ICD-10-CM

## 2014-09-06 DIAGNOSIS — R4189 Other symptoms and signs involving cognitive functions and awareness: Secondary | ICD-10-CM

## 2014-09-06 NOTE — Telephone Encounter (Signed)
Sister called says pharmacy did not have rx ready, I called the pharmacy and the request requires prior authorization, it is being faxed over. Sister expressed disappointment and concern as pt is in need of this medication

## 2014-09-06 NOTE — Telephone Encounter (Signed)
[name] would like a call back regarding her mother, she have a rash and would like to know if she can send a picture of it. Please call [phone]

## 2014-09-06 NOTE — Patient Instructions (Signed)
Functional Quadriceps: Sit to Stand   Sit on edge of chair, feet flat on floor. Stand upright, extending knees fully. Repeat __10_ times per set. Do _1___ sets per session. Do __2__ sessions per day.  http://orth.exer.us/735   Copyright  VHI. All rights reserved.  Ankle Bend (Dorsiflexion and Plantar Flexion)   Sitting or lying down, point toes up, keeping both heels on floor. Then press toes to floor, raising heels. Repeat __10__ times. Do ___2_ sessions per day.  http://gt2.exer.us/404   Copyright  VHI. All rights reserved.  Hamstring Stretch   Sitting with left leg straight on bed, and foot of other leg on floor, lean forward toward toes of straight leg. Hold _20___ seconds. Repeat _3___ times. Do _2___ sessions per day.  http://gt2.exer.us/303   Copyright  VHI. All rights reserved.

## 2014-09-06 NOTE — Patient Instructions (Addendum)
Laying on your back clasp both hands together, raise arms overhead, have someone help to straighten your elbow/support left arm 10-20 reps 1x day  While laying on your back, have a family member gently bend/ straighten your elbow 20 reps 1-2x day  STOP if you have increased sharp, pain, gentle stretching is ok

## 2014-09-06 NOTE — Therapy (Signed)
[facility] [address] [place], [place], [address] Phone: [phone]   Fax:  [phone]  Occupational Therapy Treatment  Patient Details  Name: Mary Le MRN: [id number] Date of Birth: 10-20-1945 Referring Provider:  [name], MD  Encounter Date: 09/06/2014      OT End of Session - 09/06/14 1307    Visit Number 5   Number of Visits 16   Date for OT Re-Evaluation 10/04/14   Authorization Type medicare - needs G code!   Authorization - Visit Number 5   Authorization - Number of Visits 10   OT Start Time 1155   OT Stop Time 1230   OT Time Calculation (min) 35 min   Activity Tolerance Patient tolerated treatment well   Behavior During Therapy WFL for tasks assessed/performed      Past Medical History  Diagnosis Date  . Hypertension   . Hypercholesteremia   . Back pain     arthritis  . CVA (cerebral infarction)     Past Surgical History  Procedure Laterality Date  . Appendectomy    . Tonsillectomy    . Hemorroidectomy    . Tubal ligation    . Radiology with anesthesia N/A 06/03/2014    Procedure: RADIOLOGY WITH ANESTHESIA;  Surgeon: [name], MD;  Location: [facility];  Service: Radiology;  Laterality: N/A;  . Tee without cardioversion N/A 06/07/2014    Procedure: TRANSESOPHAGEAL ECHOCARDIOGRAM (TEE);  Surgeon: [name], MD;  Location: [facility] ENDOSCOPY;  Service: Cardiovascular;  Laterality: N/A;  . Loop recorder implant  06/07/2014    MDT LINQ implanted by Dr [name] for cryptogenic stroke  . Loop recorder implant N/A 06/07/2014    Procedure: LOOP RECORDER IMPLANT;  Surgeon: [name], MD;  Location: [facility] CATH LAB;  Service: Cardiovascular;  Laterality: N/A;    There were no vitals taken for this visit.  Visit Diagnosis:  Impaired functional mobility and activity tolerance  Spastic hemiplegia affecting left nondominant side  Pain in joint, shoulder region, left  Impaired  visual perception  Impaired cognition      Subjective Assessment - 09/06/14 1210    Currently in Pain? Yes   Pain Score 3    Pain Location Arm   Pain Orientation Left   Pain Descriptors / Indicators Aching   Pain Type Chronic pain   Pain Frequency Intermittent   Aggravating Factors  malpositioning   Pain Relieving Factors repositioning   Multiple Pain Sites No                 OT Treatments/Exercises (OP) - 09/06/14 0001    Neurological Re-education Exercises   Other Information P/ROM shoulder flexion, elbow flexion/ extension and supination performed by therapist.   Shoulder Flexion AAROM;Self ROM;10 reps;Supine  with hands clasped together, and seated reach for floor   Elbow Extension PROM;20 reps;Supine  Pt's daughter performed after therapist demonstrated   Forearm Supination PROM;Self ROM;20 reps;Seated   Wrist Ulnar Deviation PROM;Self ROM;10 reps  pt/ daughter instructed in how to perform     Pt demonstrates only mild edema in left hand today. She reports that edema glove and resting hand splint are working well.           OT Education - 09/06/14 1307    Education provided Yes   Education Details self ROM shoulder flexion in supine, P/ROM elbow flexion / extension   Person(s) Educated Patient;Caregiver(s)   Methods Explanation;Demonstration;Handout;Verbal cues   Comprehension Verbalized understanding;Returned  demonstration;Verbal cues required          OT Short Term Goals - 09/06/14 1310    OT SHORT TERM GOAL #1   Title Pt and daughter will be I with HEP - 2/16/20126   Status Achieved   OT SHORT TERM GOAL #2   Title Pt will require a with back only for UE body bathing in shower   Status On-going   OT SHORT TERM GOAL #3   Title Pt will be close s for LB  bathing in shower   Status On-going   OT SHORT TERM GOAL #4   Title  Pt will be min afor UB dressing with AE prn   Status On-going   OT SHORT TERM GOAL #5   Title Pt will be min a for LB  dressing with AE prn   Status On-going   OT SHORT TERM GOAL #6   Title Pt will report no more than 4/10 pain in LUE with HEP   Status On-going           OT Long Term Goals - 08/09/14 1205    OT LONG TERM GOAL #1   Title Pt and daughter will be mod I with updated HEP prn - 10/04/2014   Status New   OT LONG TERM GOAL #2   Title Pt will be supervision with simple meal prep at ambulatory level   Status New   OT LONG TERM GOAL #3   Title Pt will be mod I with toilet hygiene   Status New   OT LONG TERM GOAL #4   Title Pt will be able to read 3 sentence paragraph with strategies with min vc's   Status New   OT LONG TERM GOAL #5   Title Pt and daughter will be able to identify 2 strategies to compensate for memory deficit.   Status New   Long Term Additional Goals   Additional Long Term Goals Yes   OT LONG TERM GOAL #6   Title Pt will report no greater than 3/10 pain in LUE with HEP   Status New   OT LONG TERM GOAL #7   Title Pt will demonstrate ability to use LUE as gross stabilizer with basic self care tasks   Status New               Plan - 09/06/14 1308    Clinical Impression Statement Pt is progressing towards goals with decreased left hand edema and decreased LUE pain.   Rehab Potential Good   Clinical Impairments Affecting Rehab Potential pain, spasticity   OT Frequency 2x / week   OT Duration 8 weeks   OT Treatment/Interventions Self-care/ADL training;Ultrasound;Moist Heat;Fluidtherapy;Therapeutic exercise;Neuromuscular education;Energy conservation;Manual Therapy;Functional Mobility Training;DME and/or AE instruction;Passive range of motion;Visual/perceptual remediation/compensation;Cognitive remediation/compensation;Therapeutic activities;Splinting;Patient/family education;Balance training   Plan check short term goals, neuro re-ed   OT Home Exercise Plan table slides, self ROM shoulder flexion in supine, P/ROM elbow flexion/ extension   Consulted and Agree with  Plan of Care Patient;Family member/caregiver   Family Member Consulted daughter        Problem List Patient Active Problem List   Diagnosis Date Noted  . Spastic hemiplegia affecting nondominant side 07/25/2014  . 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 Left hemiparesis 06/10/2014  . Left-sided neglect 06/10/2014  . 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 Morbid obesity- BMI 40 06/06/2014  . Acute respiratory failure with hypoxia 06/05/2014  . Cerebral infarction  due to occlusion of right middle cerebral artery 06/03/2014  . Cerebral infarct 06/03/2014  . Altered mental status 06/03/2014  . GERD (gastroesophageal reflux disease) 04/12/2014  . Benign essential HTN 02/07/2014  . Dyslipidemia- statin intol 02/07/2014  . Chest pain- Low risk Myoview July 2015 02/06/2014    RINE,KATHRYN 09/06/2014, 1:20 PM Theone Murdoch, OTR/L [phone] Phone: [phone] 1:20 PM 09/06/2014 [facility] [address] [place], [place], [address] Phone: [phone]   Fax:  [phone]

## 2014-09-06 NOTE — Telephone Encounter (Signed)
LMOM for daughter [name] (on HIPPA) with all of Dr Vonna Kotyk instr's/info below.

## 2014-09-06 NOTE — Telephone Encounter (Signed)
Called and LMOM that we are not set up to do off site evals of pts, and would have to see pt in person to evaluate rash. But gave her info that [facility] does have E-visits now for some issues and that she can call the Mychart # [phone] to set up a Mychart acct first and they may be able to instr from there.

## 2014-09-06 NOTE — Therapy (Signed)
[facility] [address] [place], [place], [address] Phone: [phone]   Fax:  [phone]  Physical Therapy Treatment  Patient Details  Name: Mary Le MRN: [id number] Date of Birth: 05/14/1946 Referring Provider:  [name], MD  Encounter Date: 09/06/2014      PT End of Session - 09/06/14 1402    Visit Number 5  G code 5   Number of Visits 16   Date for PT Re-Evaluation 10/07/14   PT Start Time 1104   PT Stop Time 1146   PT Time Calculation (min) 42 min   Equipment Utilized During Treatment Gait belt   Activity Tolerance Patient tolerated treatment well   Behavior During Therapy [facility] for tasks assessed/performed      Past Medical History  Diagnosis Date  . Hypertension   . Hypercholesteremia   . Back pain     arthritis  . CVA (cerebral infarction)     Past Surgical History  Procedure Laterality Date  . Appendectomy    . Tonsillectomy    . Hemorroidectomy    . Tubal ligation    . Radiology with anesthesia N/A 06/03/2014    Procedure: RADIOLOGY WITH ANESTHESIA;  Surgeon: [name], MD;  Location: [facility];  Service: Radiology;  Laterality: N/A;  . Tee without cardioversion N/A 06/07/2014    Procedure: TRANSESOPHAGEAL ECHOCARDIOGRAM (TEE);  Surgeon: [name], MD;  Location: [facility] ENDOSCOPY;  Service: Cardiovascular;  Laterality: N/A;  . Loop recorder implant  06/07/2014    MDT LINQ implanted by Dr [name] for cryptogenic stroke  . Loop recorder implant N/A 06/07/2014    Procedure: LOOP RECORDER IMPLANT;  Surgeon: [name], MD;  Location: [facility] CATH LAB;  Service: Cardiovascular;  Laterality: N/A;    There were no vitals taken for this visit.  Visit Diagnosis:  Abnormality of gait      Subjective Assessment - 09/06/14 1109    Symptoms The patient reports she has not been sleeping well due to allergic reaction to medication.  Her daughter discontinued med tizanadine due to  rash per discussion with MD office.   Currently in Pain? No/denies     Gait: Without device x 250 ft, x 3 reps with cues on longer step length and greater speed during gait.  THERAPEUTIC EXERCISE: Sit<>stand x 5 reps x 2 sets Standing knee flexion L x 10 repetitions Seated ankle dorsiflexion x 10 reps Hamstring stretches seated   NEUROMUSCULAR RE-EDUCATION: Standing activities including marching, sidestepping near countertop for support as needed with CGA Standing foot taps to 6" step with CGA x 10 reps Standing foot taps between 6 and 12" surface working on L foot motor control and then L leg stance when moving R LE between targets with CGA to min A.                       PT Education - 09/06/14 1145    Education Details HEP: sit<>stand, hamstring stretching, and heel/toe raises seated.   Person(s) Educated Patient;Child(ren)   Methods Explanation;Demonstration;Handout   Comprehension Returned demonstration;Verbalized understanding          PT Short Term Goals - 08/10/14 2376    PT SHORT TERM GOAL #1   Title The patient will return demo HEP indep with written cues.  Target date 09/09/2014   Time 4   Period Weeks   PT SHORT TERM GOAL #2   Title The patient will improve Berg score  from 27/56 up to 35/56 to demo decreasing risk for falls.  Target date 09/09/2014   Time 4   Period Weeks   PT SHORT TERM GOAL #3   Title The patient will improve gait speed from 1.44 f t/sec up to 1.8 ft/sec to demo decreasing risk for falls.  Target date 09/09/2014   Time 4   Period Weeks   PT SHORT TERM GOAL #4   Title The patient will ambulate household surfaces without a device independently x 400 ft for improved independence in the home.  Target date 09/09/2014   Time 4   Period Weeks   PT SHORT TERM GOAL #5   Title The patient will move sit<>supine to the right and left sides independently.  Target date 09/09/2014   Time 4   Period Weeks           PT Long Term  Goals - 08/10/14 0839    PT LONG TERM GOAL #1   Title The patient will verbalize understanding of CVA risk factors/warning signs.  [place] date 10/08/2014   Time 8   Period Weeks   PT LONG TERM GOAL #2   Title The patient will improve Berg score from 27/56 up to 40/56 to demo decreased risk for falls.  Target date 10/08/2014   Time 8   Period Weeks   PT LONG TERM GOAL #3   Title The patient will improve SIS: mobility from 33% up to 45%.  Target date 10/08/2014   Time 8   Period Weeks   PT LONG TERM GOAL #4   Title The patient will improve gait speed from 1.44 ft/sec up to 2.3 ft/sec to demo improving functional ambulation. Target date 10/08/2014   Time 8   Period Weeks   PT LONG TERM GOAL #5   Title The patient will negotiate 4 steps modified indep with reciprocal pattern and one HR.   Target date 10/08/2014   Time 8   Period Weeks   Additional Long Term Goals   Additional Long Term Goals Yes   PT LONG TERM GOAL #6   Title The patient will negotiate level community surfaces without a device x 5 minutes independently for improved community mobility.  Target date 10/08/2014   Time 8   Period Weeks               Plan - 09/06/14 1409    Clinical Impression Statement Patient limited during sessions by fatigue and being tired from recent change in sleep pattern (due to rash from med per patient report).  Continue to progress to patient tolerance.   PT Home Exercise Plan Gait, endurance training, L ankle control, L side strengthening, posture and balance.        Problem List Patient Active Problem List   Diagnosis Date Noted  . Spastic hemiplegia affecting nondominant side 07/25/2014  . 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 Left hemiparesis 06/10/2014  . Left-sided neglect 06/10/2014  . 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 Morbid obesity- BMI 40 06/06/2014  . Acute respiratory failure with hypoxia  06/05/2014  . Cerebral infarction due to occlusion of right middle cerebral artery 06/03/2014  . Cerebral infarct 06/03/2014  . Altered mental status 06/03/2014  . GERD (gastroesophageal reflux disease) 04/12/2014  . Benign essential HTN 02/07/2014  . Dyslipidemia- statin intol 02/07/2014  . Chest pain- Low risk Myoview July 2015 02/06/2014    Avaleen Brownley, PT 09/06/2014, 2:13 PM  [facility] [address] [place], [place], [address] Phone: [phone]   Fax:  [phone]

## 2014-09-07 NOTE — Progress Notes (Signed)
Loop recorder 

## 2014-09-08 ENCOUNTER — Ambulatory Visit: Payer: Medicare Other | Admitting: Occupational Therapy

## 2014-09-08 ENCOUNTER — Ambulatory Visit (INDEPENDENT_AMBULATORY_CARE_PROVIDER_SITE_OTHER): Payer: Medicare Other | Admitting: Family Medicine

## 2014-09-08 ENCOUNTER — Ambulatory Visit: Payer: Medicare Other | Admitting: Physical Therapy

## 2014-09-08 ENCOUNTER — Telehealth: Payer: Self-pay | Admitting: *Deleted

## 2014-09-08 ENCOUNTER — Encounter: Payer: Self-pay | Admitting: Physical Therapy

## 2014-09-08 ENCOUNTER — Encounter: Payer: Self-pay | Admitting: Occupational Therapy

## 2014-09-08 VITALS — BP 136/80 | HR 96 | Temp 97.7°F | Resp 20 | Ht 64.25 in | Wt 200.0 lb

## 2014-09-08 DIAGNOSIS — H539 Unspecified visual disturbance: Secondary | ICD-10-CM | POA: Diagnosis not present

## 2014-09-08 DIAGNOSIS — R269 Unspecified abnormalities of gait and mobility: Secondary | ICD-10-CM

## 2014-09-08 DIAGNOSIS — M25512 Pain in left shoulder: Secondary | ICD-10-CM

## 2014-09-08 DIAGNOSIS — G8114 Spastic hemiplegia affecting left nondominant side: Secondary | ICD-10-CM

## 2014-09-08 DIAGNOSIS — I63411 Cerebral infarction due to embolism of right middle cerebral artery: Secondary | ICD-10-CM | POA: Diagnosis not present

## 2014-09-08 DIAGNOSIS — Z7409 Other reduced mobility: Secondary | ICD-10-CM | POA: Diagnosis not present

## 2014-09-08 DIAGNOSIS — L304 Erythema intertrigo: Secondary | ICD-10-CM | POA: Diagnosis not present

## 2014-09-08 DIAGNOSIS — R4189 Other symptoms and signs involving cognitive functions and awareness: Secondary | ICD-10-CM

## 2014-09-08 MED ORDER — CLOTRIMAZOLE 1 % EX CREA: 1.0000 "application " | TOPICAL_CREAM | Freq: Two times a day (BID) | CUTANEOUS | Status: DC

## 2014-09-08 NOTE — Progress Notes (Signed)
Subjective:  This chart was scribed for [name], MD by Randa Evens, ED Scribe. This Patient was seen in room 09 and the patients care was started at 7:05 PM   Patient ID: Mary Le, female    DOB: 1946/05/29, 69 y.o.   MRN: [id number]  Chief Complaint  Patient presents with  . Rash    on stomach    Rash Pertinent negatives include no fever or shortness of breath.   HPI Comments: Mary Le is a 69 y.o. female who presents to the [facility] for improving rash on left ingunial area onset 3 days prior. Complicated recent medical Hx including stroke of right MCA with residual left sided deficit. See prior notes regarding this. Pt states that she had generalized itching about 4 days before the rash appeared but relates this to an allergic reaction. Pt states that she had an allergic reaction to Tizanidine. Pt describes the rash as "fire red." Pt states she has tried anti fungal cream (clotrimazole) that has provided relief before running out. Pt denies trouble swallowing, wheezing, or SOB. Pt states she takes Ultracet for left sided pain as needed since having CVA.    Patient Active Problem List   Diagnosis Date Noted  . Spastic hemiplegia affecting nondominant side 07/25/2014  . 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 Left hemiparesis 06/10/2014  . Left-sided neglect 06/10/2014  . 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 Morbid obesity- BMI 40 06/06/2014  . Acute respiratory failure with hypoxia 06/05/2014  . Cerebral infarction due to occlusion of right middle cerebral artery 06/03/2014  . Cerebral infarct 06/03/2014  . Altered mental status 06/03/2014  . GERD (gastroesophageal reflux disease) 04/12/2014  . Benign essential HTN 02/07/2014  . Dyslipidemia- statin intol 02/07/2014  . Chest pain- Low risk Myoview July 2015 02/06/2014   Past Medical History  Diagnosis Date  .  Hypertension   . Hypercholesteremia   . Back pain     arthritis  . CVA (cerebral infarction)    Past Surgical History  Procedure Laterality Date  . Appendectomy    . Tonsillectomy    . Hemorroidectomy    . Tubal ligation    . Radiology with anesthesia N/A 06/03/2014    Procedure: RADIOLOGY WITH ANESTHESIA;  Surgeon: [name], MD;  Location: [facility];  Service: Radiology;  Laterality: N/A;  . Tee without cardioversion N/A 06/07/2014    Procedure: TRANSESOPHAGEAL ECHOCARDIOGRAM (TEE);  Surgeon: [name], MD;  Location: [facility] ENDOSCOPY;  Service: Cardiovascular;  Laterality: N/A;  . Loop recorder implant  06/07/2014    MDT LINQ implanted by Dr [name] for cryptogenic stroke  . Loop recorder implant N/A 06/07/2014    Procedure: LOOP RECORDER IMPLANT;  Surgeon: [name], MD;  Location: [facility] CATH LAB;  Service: Cardiovascular;  Laterality: N/A;   Allergies  Allergen Reactions  . Tizanidine Itching  . Aspirin Nausea And Vomiting    Tolerated baby aspirin, but with more than once per day of full strength for aches and pains - had stomach upset.  No history of PUD/gastric bleeding known.   . Statins Other (See Comments)    Myalgias and chest pain (has tried Lipitor and Pravachol)   Prior to Admission medications   Medication Sig Start Date End Date Taking? Authorizing Provider  acetaminophen (TYLENOL) 325 MG tablet Take 650 mg by mouth every 6 (six) hours as needed (pain).    Yes Historical  Provider, MD  B Complex Vitamins (B COMPLEX-B12 PO) Take 1 tablet by mouth daily.    Yes Historical Provider, MD  calcium-vitamin D (OSCAL WITH D) 500-200 MG-UNIT per tablet Take 1 tablet by mouth 2 (two) times daily. 06/23/14  Yes [name] Love, PA-C  clopidogrel (PLAVIX) 75 MG tablet Take 1 tablet (75 mg total) by mouth daily. 08/17/14  Yes Antony Contras, MD  famotidine (PEPCID) 10 MG tablet Take 1 tablet (10 mg total) by mouth 2 (two) times daily. 06/23/14  Yes [name] Love, PA-C  fish  oil-omega-3 fatty acids 1000 MG capsule Take 1 g by mouth daily.    Yes Historical Provider, MD  loratadine (ALAVERT) 10 MG tablet Take 10 mg by mouth daily as needed for allergies.   Yes Historical Provider, MD  methocarbamol (ROBAXIN) 500 MG tablet Take 500 mg by mouth 3 (three) times daily.   Yes Historical Provider, MD  metoprolol succinate (TOPROL-XL) 25 MG 24 hr tablet Take 0.5 tablets (12.5 mg total) by mouth daily. 06/23/14  Yes [name] Love, PA-C  Multiple Vitamin (MULTIVITAMIN WITH MINERALS) TABS tablet Take 1 tablet by mouth daily.   Yes Historical Provider, MD  Polyethyl Glycol-Propyl Glycol (SYSTANE OP) Place 1 drop into both eyes daily as needed (dry eyeys).   Yes Historical Provider, MD  senna-docusate (SENOKOT-S) 8.6-50 MG per tablet Take 3 tablets by mouth 2 (two) times daily. 06/23/14  Yes [name] Love, PA-C  traMADol-acetaminophen (ULTRACET) 37.5-325 MG per tablet Take 1 tablet by mouth every 6 (six) hours as needed for moderate pain or severe pain. 09/02/14  Yes [name], MD   History   Social History  . Marital Status: Married    Spouse Name: N/A  . Number of Children: N/A  . Years of Education: N/A   Occupational History  . Not on file.   Social History Main Topics  . Smoking status: Former Smoker -- 5 years  . Smokeless tobacco: Never Used  . Alcohol Use: Yes     Comment: twice a week. Wine or liquor   . Drug Use: No  . Sexual Activity: Not on file   Other Topics Concern  . Not on file   Social History Narrative   Lives in [place]    Married. Education: [employer].     Review of Systems  Constitutional: Negative for fever.  HENT: Negative for trouble swallowing.   Respiratory: Negative for shortness of breath and wheezing.   Gastrointestinal: Negative for abdominal pain.  Skin: Positive for pallor and rash.     Objective:   BP 136/80 mmHg  Pulse 96  Temp(Src) 97.7 F (36.5 C) (Oral)  Resp 20  Ht 5' 4.25" (1.632 m)  Wt 200 lb (90.719  kg)  BMI 34.06 kg/m2  SpO2 100%   Physical Exam  Constitutional: She is oriented to person, place, and time. She appears well-developed and well-nourished. No distress.  HENT:  Head: Normocephalic and atraumatic.  Eyes: Conjunctivae and EOM are normal.  Neck: Neck supple. No tracheal deviation present.  Cardiovascular: Normal rate.   Pulmonary/Chest: Effort normal. No respiratory distress.  Musculoskeletal: Normal range of motion.  Neurological: She is alert and oriented to person, place, and time.  Skin: Skin is warm and dry. Rash noted.  Left inguinal fold more so just below the panus, hyperpigmentation with slight erythema with minimal white appearance central most aspect of rash, midline there is small area of erythema near panus.   Psychiatric: She has  a normal mood and affect. Her behavior is normal.  Nursing note and vitals reviewed.    Assessment & Plan:   Mary Le is a 69 y.o. female Intertrigo - Plan: clotrimazole (LOTRIMIN) 1 % cream  -improving on home dosing of clotrimazole.  Refilled, continue for 1 week after resolution. Drying powder if needed but allow affected area exposure to air during day. rtc if persists or new areas/worsening.   L sided pain with prior CVA. Will continue ultracet rx for now, but to discuss pain regimen with PM&R and neuro.   Meds ordered this encounter  Medications  . methocarbamol (ROBAXIN) 500 MG tablet    Sig: Take 500 mg by mouth 3 (three) times daily.  . clotrimazole (LOTRIMIN) 1 % cream    Sig: Apply 1 application topically 2 (two) times daily.    Dispense:  60 g    Refill:  1   Patient Instructions  Discuss pain medicine at next visit with physical medicine and rehab doctor or neurologist.  I can refill it for now if needed.  Continue clotrimazole cream twice per day to affected area, use for 1 weeks after rash is gone. If worsening - let me know. Drying powder can also be used if needed. Return to the clinic or go to the  nearest emergency room if any of your symptoms worsen or new symptoms occur.    Intertrigo Intertrigo is a skin condition that occurs in between folds of skin in places on the body that rub together a lot and do not get much ventilation. It is caused by heat, moisture, friction, sweat retention, and lack of air circulation, which produces red, irritated patches and, sometimes, scaling or drainage. People who have diabetes, who are obese, or who have treatment with antibiotics are at increased risk for intertrigo. The most common sites for intertrigo to occur include:  The groin.  The breasts.  The armpits.  Folds of abdominal skin.  Webbed spaces between the fingers or toes. Intertrigo may be aggravated by:  Sweat.  Feces.  Yeast or bacteria that are present near skin folds.  Urine.  Vaginal discharge. HOME CARE INSTRUCTIONS  The following steps can be taken to reduce friction and keep the affected area cool and dry:  Expose skin folds to the air.  Keep deep skin folds separated with cotton or linen cloth. Avoid tight fitting clothing that could cause chafing.  Wear open-toed shoes or sandals to help reduce moisture between the toes.  Apply absorbent powders to affected areas as directed by your caregiver.  Apply over-the-counter barrier pastes, such as zinc oxide, as directed by your caregiver.  If you develop a fungal infection in the affected area, your caregiver may have you use antifungal creams. SEEK MEDICAL CARE IF:   The rash is not improving after 1 week of treatment.  The rash is getting worse (more red, more swollen, more painful, or spreading).  You have a fever or chills. MAKE SURE YOU:   Understand these instructions.  Will watch your condition.  Will get help right away if you are not doing well or get worse. Document Released: 07/08/2005 Document Revised: 09/30/2011 Document Reviewed: 12/21/2009 [facility] Patient Information 2015 [place],  [place]. This information is not intended to replace advice given to you by your health care provider. Make sure you discuss any questions you have with your health care provider.     I personally performed the services described in this documentation, which was scribed in my  presence. The recorded information has been reviewed and considered, and addended by me as needed.

## 2014-09-08 NOTE — Patient Instructions (Signed)
Discuss pain medicine at next visit with physical medicine and rehab doctor or neurologist.  I can refill it for now if needed.  Continue clotrimazole cream twice per day to affected area, use for 1 weeks after rash is gone. If worsening - let me know. Drying powder can also be used if needed. Return to the clinic or go to the nearest emergency room if any of your symptoms worsen or new symptoms occur.    Intertrigo Intertrigo is a skin condition that occurs in between folds of skin in places on the body that rub together a lot and do not get much ventilation. It is caused by heat, moisture, friction, sweat retention, and lack of air circulation, which produces red, irritated patches and, sometimes, scaling or drainage. People who have diabetes, who are obese, or who have treatment with antibiotics are at increased risk for intertrigo. The most common sites for intertrigo to occur include:  The groin.  The breasts.  The armpits.  Folds of abdominal skin.  Webbed spaces between the fingers or toes. Intertrigo may be aggravated by:  Sweat.  Feces.  Yeast or bacteria that are present near skin folds.  Urine.  Vaginal discharge. HOME CARE INSTRUCTIONS  The following steps can be taken to reduce friction and keep the affected area cool and dry:  Expose skin folds to the air.  Keep deep skin folds separated with cotton or linen cloth. Avoid tight fitting clothing that could cause chafing.  Wear open-toed shoes or sandals to help reduce moisture between the toes.  Apply absorbent powders to affected areas as directed by your caregiver.  Apply over-the-counter barrier pastes, such as zinc oxide, as directed by your caregiver.  If you develop a fungal infection in the affected area, your caregiver may have you use antifungal creams. SEEK MEDICAL CARE IF:   The rash is not improving after 1 week of treatment.  The rash is getting worse (more red, more swollen, more painful, or  spreading).  You have a fever or chills. MAKE SURE YOU:   Understand these instructions.  Will watch your condition.  Will get help right away if you are not doing well or get worse. Document Released: 07/08/2005 Document Revised: 09/30/2011 Document Reviewed: 12/21/2009 [facility] Patient Information 2015 [place], [place]. This information is not intended to replace advice given to you by your health care provider. Make sure you discuss any questions you have with your health care provider.

## 2014-09-08 NOTE — Telephone Encounter (Signed)
Pt's sister called trying to figure whats going on with her pain medication. A prior authorization was approved yesterday, I called the pharmacy, the script is being filled, I called the pt back and informed

## 2014-09-08 NOTE — Therapy (Signed)
[facility] [address] [place], [place], [address] Phone: [phone]   Fax:  [phone]  Occupational Therapy Treatment  Patient Details  Name: Mary Le MRN: [id number] Date of Birth: Apr 24, 1946 Referring Provider:  [name], MD  Encounter Date: 09/08/2014      OT End of Session - 09/08/14 1717    Visit Number 6   Number of Visits 16   Date for OT Re-Evaluation 10/04/14   Authorization Type medicare - needs G code!   OT Start Time 1530   OT Stop Time 1613   OT Time Calculation (min) 43 min   Activity Tolerance Patient tolerated treatment well      Past Medical History  Diagnosis Date  . Hypertension   . Hypercholesteremia   . Back pain     arthritis  . CVA (cerebral infarction)     Past Surgical History  Procedure Laterality Date  . Appendectomy    . Tonsillectomy    . Hemorroidectomy    . Tubal ligation    . Radiology with anesthesia N/A 06/03/2014    Procedure: RADIOLOGY WITH ANESTHESIA;  Surgeon: [name], MD;  Location: [facility];  Service: Radiology;  Laterality: N/A;  . Tee without cardioversion N/A 06/07/2014    Procedure: TRANSESOPHAGEAL ECHOCARDIOGRAM (TEE);  Surgeon: [name], MD;  Location: [facility] ENDOSCOPY;  Service: Cardiovascular;  Laterality: N/A;  . Loop recorder implant  06/07/2014    MDT LINQ implanted by Dr [name] for cryptogenic stroke  . Loop recorder implant N/A 06/07/2014    Procedure: LOOP RECORDER IMPLANT;  Surgeon: [name], MD;  Location: [facility] CATH LAB;  Service: Cardiovascular;  Laterality: N/A;    There were no vitals taken for this visit.  Visit Diagnosis:  Spastic hemiplegia affecting left nondominant side  Pain in joint, shoulder region, left  Impaired visual perception  Impaired cognition      Subjective Assessment - 09/08/14 1534    Symptoms I am having pain between my shoulder and my elbow (LUE)   Pertinent History see epic  snapshot;  HTN, fluctuating BP since stroke   Currently in Pain? Yes   Pain Score 4    Pain Location Other (Comment)  between the elbow and the shoulder   Pain Orientation Left   Pain Descriptors / Indicators Sore   Pain Type Chronic pain   Pain Onset More than a month ago   Pain Frequency Intermittent   Aggravating Factors  hanging down, if I lay on it, movement   Pain Relieving Factors medicine, positioning                 OT Treatments/Exercises (OP) - 09/08/14 0001    ADLs   Bathing Demonstrated adaptive techniques to allow pt increased independence in bathing - pt able to verbalize and return demonstrate simulation. Also discussed strategies for donning long sleeved shirt. Pt reports daughter is fearfull of pt falling so "she won't let me wash my bottom."  Discussed safe ways for patient to stand and wash bottom in shower with close supervision - will discuss with daughter next visit.  Pt to bring in long sleeved shirt and long handled sponge in order to bend handle to increase ease in using sponge to wash back.     Home Maintenance Practiced loading and unloading dishwasher in standing with emphasis on dynamic standing balance, weight shifting, scanning to the left, improving activity tolerance.  OT Short Term Goals - 09/08/14 1537    OT SHORT TERM GOAL #1   Title Pt and daughter will be I with HEP - 2/16/20126   Status Achieved   OT SHORT TERM GOAL #2   Title Pt will require a with back only for UE body bathing in shower   Status Not Met  mod a; daughter washes R side and back   OT SHORT TERM GOAL #3   Title Pt will be close s for LB  bathing in shower   Status Not Met  daughter washes bottom   OT SHORT TERM GOAL #4   Title  Pt will be min afor UB dressing with AE prn   Status Achieved  pt reports she needs a little help with long sleeve shirts.   OT SHORT TERM GOAL #5   Title Pt will be min a for LB dressing with AE prn   Status  Achieved   OT SHORT TERM GOAL #6   Title Pt will report no more than 4/10 pain in LUE with HEP   Status Achieved   OT SHORT TERM GOAL #7   Status Achieved           OT Long Term Goals - 09/08/14 1712    OT LONG TERM GOAL #1   Title Pt and daughter will be mod I with updated HEP prn - 10/04/2014   Status On-going   OT LONG TERM GOAL #2   Title Pt will be supervision with simple meal prep at ambulatory level   Status On-going   OT LONG TERM GOAL #3   Title Pt will be mod I with toilet hygiene   Status On-going   OT LONG TERM GOAL #4   Title Pt will be able to read 3 sentence paragraph with strategies with min vc's   Status On-going   OT LONG TERM GOAL #5   Title Pt and daughter will be able to identify 2 strategies to compensate for memory deficit.   Status On-going   OT LONG TERM GOAL #6   Title Pt will report no greater than 3/10 pain in LUE with HEP   Status On-going   OT LONG TERM GOAL #7   Title Pt will demonstrate ability to use LUE as gross stabilizer with basic self care tasks   Status On-going               Plan - 09/08/14 1718    Clinical Impression Statement Pt making good progress toward STG's. Addressed ADL strategies today to increase indepence.   Rehab Potential Good   Clinical Impairments Affecting Rehab Potential pain, spasticity   OT Frequency 2x / week   OT Duration 8 weeks   OT Treatment/Interventions Self-care/ADL training;Ultrasound;Moist Heat;Fluidtherapy;Therapeutic exercise;Neuromuscular education;Energy conservation;Manual Therapy;Functional Mobility Training;DME and/or AE instruction;Passive range of motion;Visual/perceptual remediation/compensation;Cognitive remediation/compensation;Therapeutic activities;Splinting;Patient/family education;Balance training   Plan ADL strategies, functional mobility   Consulted and Agree with Plan of Care Patient        Problem List Patient Active Problem List   Diagnosis Date Noted  . Spastic  hemiplegia affecting nondominant side 07/25/2014  . 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 Left hemiparesis 06/10/2014  . Left-sided neglect 06/10/2014  . 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 Morbid obesity- BMI 40 06/06/2014  . Acute respiratory failure with hypoxia 06/05/2014  . Cerebral infarction due to occlusion of right middle cerebral artery 06/03/2014  . Cerebral infarct 06/03/2014  .  Altered mental status 06/03/2014  . GERD (gastroesophageal reflux disease) 04/12/2014  . Benign essential HTN 02/07/2014  . Dyslipidemia- statin intol 02/07/2014  . Chest pain- Low risk Myoview July 2015 02/06/2014    Quay Burow, OTR/L 09/08/2014, 5:22 PM  [facility] [address] [place], [place], [address] Phone: [phone]   Fax:  [phone]

## 2014-09-08 NOTE — Therapy (Signed)
[facility] [address] [place], [place], [address] Phone: [phone]   Fax:  [phone]  Physical Therapy Treatment  Patient Details  Name: Mary Le MRN: [id number] Date of Birth: 01/21/1946 Referring Provider:  Greene, Jeffrey R, MD  Encounter Date: 09/08/2014      PT End of Session - 09/08/14 1453    Visit Number 6  G code 6   Number of Visits 16   Date for PT Re-Evaluation 10/07/14   PT Start Time 1448   PT Stop Time 1530   PT Time Calculation (min) 42 min   Equipment Utilized During Treatment Gait belt   Activity Tolerance Patient tolerated treatment well   Behavior During Therapy WFL for tasks assessed/performed      Past Medical History  Diagnosis Date  . Hypertension   . Hypercholesteremia   . Back pain     arthritis  . CVA (cerebral infarction)     Past Surgical History  Procedure Laterality Date  . Appendectomy    . Tonsillectomy    . Hemorroidectomy    . Tubal ligation    . Radiology with anesthesia N/A 06/03/2014    Procedure: RADIOLOGY WITH ANESTHESIA;  Surgeon: Sanjeev K Deveshwar, MD;  Location: [facility] OR;  Service: Radiology;  Laterality: N/A;  . Tee without cardioversion N/A 06/07/2014    Procedure: TRANSESOPHAGEAL ECHOCARDIOGRAM (TEE);  Surgeon: Brian S Crenshaw, MD;  Location: [facility] ENDOSCOPY;  Service: Cardiovascular;  Laterality: N/A;  . Loop recorder implant  06/07/2014    MDT LINQ implanted by Dr Allred for cryptogenic stroke  . Loop recorder implant N/A 06/07/2014    Procedure: LOOP RECORDER IMPLANT;  Surgeon: James D Allred, MD;  Location: [facility] CATH LAB;  Service: Cardiovascular;  Laterality: N/A;    There were no vitals taken for this visit.  Visit Diagnosis:  Abnormality of gait  Impaired functional mobility and activity tolerance      Subjective Assessment - 09/08/14 1452    Symptoms No new complaints. Has new single point cane. Denies falls. No change in left UE pain.    Currently in Pain? Yes   Pain Score 4    Pain Location Arm   Pain Orientation Left   Pain Descriptors / Indicators Aching   Pain Type Chronic pain   Pain Onset More than a month ago   Pain Frequency Intermittent   Aggravating Factors  movement   Pain Relieving Factors medicine, good positioning                    [facility] Adult PT Treatment/Exercise - 09/08/14 1457    Ambulation/Gait   Ambulation/Gait Yes   Ambulation/Gait Assistance 5: Supervision;4: Min guard   Ambulation/Gait Assistance Details cues on posture and for increased step/stride length                                 Ambulation Distance (Feet) 355 Feet   Assistive device Straight cane   Gait Pattern Step-through pattern;Decreased step length - right;Decreased stance time - left;Shuffle   Ambulation Surface Level;Indoor   Gait velocity 27.12 sec = 1.21  ft/sec with straight cane   Berg Balance Test   Sit to Stand Able to stand without using hands and stabilize independently   Standing Unsupported Able to stand safely 2 minutes   Sitting with Back Unsupported but Feet Supported on Floor or Stool Able to sit safely and   securely 2 minutes   Stand to Sit Sits safely with minimal use of hands   Transfers Able to transfer safely, minor use of hands   Standing Unsupported with Eyes Closed Able to stand 10 seconds with supervision   Standing Ubsupported with Feet Together Able to place feet together independently and stand for 1 minute with supervision   From Standing, Reach Forward with Outstretched Arm Can reach confidently >25 cm (10")   From Standing Position, Pick up Object from [place] to pick up shoe safely and easily   From Standing Position, Turn to Look Behind Over each Shoulder Looks behind from both sides and weight shifts well   Turn 360 Degrees Able to turn 360 degrees safely but slowly  5.41 to right, 5.38 to left   Standing Unsupported, Alternately Place Feet on Step/Stool Able to complete 4  steps without aid or supervision  14.65 sec's, needed min guard assist after 4 steps   Standing Unsupported, One Foot in Front Able to take small step independently and hold 30 seconds   Standing on One Leg Able to lift leg independently and hold equal to or more than 3 seconds  right stance only   Total Score 46     Self Care Pt and daughter were able to verbally describe the exercises from pt's HEP and pt was able to demo them with minimal cues on form and technique.        PT Short Term Goals - 08/10/14 9371    PT SHORT TERM GOAL #1   Title The patient will return demo HEP indep with written cues.  Target date 09/09/2014   Time 4   Period Weeks   PT SHORT TERM GOAL #2   Title The patient will improve Berg score from 27/56 up to 35/56 to demo decreasing risk for falls.  Target date 09/09/2014   Time 4   Period Weeks   PT SHORT TERM GOAL #3   Title The patient will improve gait speed from 1.44 f t/sec up to 1.8 ft/sec to demo decreasing risk for falls.  Target date 09/09/2014   Time 4   Period Weeks   PT SHORT TERM GOAL #4   Title The patient will ambulate household surfaces without a device independently x 400 ft for improved independence in the home.  Target date 09/09/2014   Time 4   Period Weeks   PT SHORT TERM GOAL #5   Title The patient will move sit<>supine to the right and left sides independently.  Target date 09/09/2014   Time 4   Period Weeks           PT Long Term Goals - 08/10/14 0839    PT LONG TERM GOAL #1   Title The patient will verbalize understanding of CVA risk factors/warning signs.  [place]get date 10/08/2014   Time 8   Period Weeks   PT LONG TERM GOAL #2   Title The patient will improve Berg score from 27/56 up to 40/56 to demo decreased risk for falls.  Target date 10/08/2014   Time 8   Period Weeks   PT LONG TERM GOAL #3   Title The patient will improve SIS: mobility from 33% up to 45%.  Target date 10/08/2014   Time 8   Period Weeks   PT LONG TERM  GOAL #4   Title The patient will improve gait speed from 1.44 ft/sec up to 2.3 ft/sec to demo improving functional ambulation. Target date 10/08/2014  Time 8   Period Weeks   PT LONG TERM GOAL #5   Title The patient will negotiate 4 steps modified indep with reciprocal pattern and one HR.   Target date 10/08/2014   Time 8   Period Weeks   Additional Long Term Goals   Additional Long Term Goals Yes   PT LONG TERM GOAL #6   Title The patient will negotiate level community surfaces without a device x 5 minutes independently for improved community mobility.  Target date 10/08/2014   Time 8   Period Weeks            Plan - 09/08/14 1453    Clinical Impression Statement Pt met some of her STG's today (#'s 1 and 2). Did not meet #'s 3 and 4, however progressing toward them.    Pt will benefit from skilled therapeutic intervention in order to improve on the following deficits Decreased balance;Difficulty walking;Postural dysfunction;Decreased strength;Decreased endurance;Decreased activity tolerance;Decreased mobility;Pain;Abnormal gait   Rehab Potential Good   PT Frequency 2x / week   PT Duration 8 weeks   PT Treatment/Interventions Gait training;Therapeutic exercise;Patient/family education;Balance training;Stair training;Neuromuscular re-education;Therapeutic activities   PT Next Visit Plan Progress gait without quad cane and balance activities. Assess remaining STG.   PT Home Exercise Plan Issued HEP today.   Consulted and Agree with Plan of Care Patient;Family member/caregiver   Family Member Consulted daughter      Problem List Patient Active Problem List   Diagnosis Date Noted  . Spastic hemiplegia affecting nondominant side 07/25/2014  . 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 Left hemiparesis 06/10/2014  . Left-sided neglect 06/10/2014  . 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 Morbid obesity- BMI 40  06/06/2014  . Acute respiratory failure with hypoxia 06/05/2014  . Cerebral infarction due to occlusion of right middle cerebral artery 06/03/2014  . Cerebral infarct 06/03/2014  . Altered mental status 06/03/2014  . GERD (gastroesophageal reflux disease) 04/12/2014  . Benign essential HTN 02/07/2014  . Dyslipidemia- statin intol 02/07/2014  . Chest pain- Low risk Myoview July 2015 02/06/2014    Bury, Kathy 09/09/2014, 6:33 PM  Kathy Bury, PTA, CLT [facility] [address], [address] [place], [place] [address] [phone] 09/09/2014, 6:33 PM     

## 2014-09-09 ENCOUNTER — Encounter: Payer: Self-pay | Admitting: Neurology

## 2014-09-09 ENCOUNTER — Ambulatory Visit (INDEPENDENT_AMBULATORY_CARE_PROVIDER_SITE_OTHER): Payer: Medicare Other | Admitting: Neurology

## 2014-09-09 VITALS — BP 142/71 | HR 82 | Ht 64.25 in | Wt 204.4 lb

## 2014-09-09 DIAGNOSIS — I63411 Cerebral infarction due to embolism of right middle cerebral artery: Secondary | ICD-10-CM

## 2014-09-09 DIAGNOSIS — I10 Essential (primary) hypertension: Secondary | ICD-10-CM | POA: Diagnosis not present

## 2014-09-09 MED ORDER — CLOPIDOGREL BISULFATE 75 MG PO TABS: 75.0000 mg | ORAL_TABLET | Freq: Every day | ORAL | Status: DC

## 2014-09-09 MED ORDER — METOPROLOL SUCCINATE ER 25 MG PO TB24: 12.5000 mg | ORAL_TABLET | Freq: Every day | ORAL | Status: DC

## 2014-09-09 MED ORDER — TRAMADOL-ACETAMINOPHEN 37.5-325 MG PO TABS: 1.0000 | ORAL_TABLET | Freq: Four times a day (QID) | ORAL | Status: DC | PRN

## 2014-09-09 NOTE — Progress Notes (Signed)
[facility] [address] [place]. [place] [address] [phone]       OFFICE FOLLOW-UP NOTE  [name] Date of Birth:  April 28, 1946 Medical Record Number:  [id number]   HPI: 29 year African American ladywho awakened normal on 06/03/14. Went to have an MRI and returned normal. She did complain of not feeling well and laid down for a nap. When her family went to awaken her they noted a left facial droop and left sided weakness. EMS was called at that time and the patient was brought in as a code stroke. Initial NIHSS of 18.presenting with left hemiplegia, left neglect and HH, right gaze. NIH stroke scale on admission was 18. She did not receive IV t-PA due to out of window. CTA showed right M1 cut off and Dr Estanislado Pandy neuroradiologist in IR performed ENDOVASCULAR COMPLETE REVASCULARIZATION OF OCCLUDED RIGHT MIDDLE CEREBRAL ARTERY DOMINANT INFERIOR DIVISION USING SUPERSELECTIVE INTRA-ARTERIAL INTRACRANIAL INTEGRELIN AND 2 PASSES WITH THE SOLITAIRE FR [place] was monitored in intensive care unit and pulmonary critical care followed her initially as primary attending. She was subsequently extubated and found to falling commands though left upper extremity weakness and plegia persisted. Left lower extremity strength improved to 4/5. Patient's prep was tightly controlled. Postprocedure brain imaging did not reveal significant hemorrhagic transformation. Patient notes was transferred to the neurology floor where she made steady progress. She was seen by physical occupational and speech therapy. Transthoracic echo showed no significant cardiac source of embolism. TEE also showed no cardiac source of embolism. Patient had LEt venous Doppler which was negative for DVT. She had loop recorder placed to look for paroxysmal atrial fibrillation. She was felt to be a good candidate for inpatient rehabilitation and hence was transferred there in a stable condition on  06/08/14. Her neurological deficits on day of discharge included mild left facial droop but no dysarthria. Left upper extremity was plegic 0/5. There was very mild weakness of the left lower extremity without any drift. She was started on aspirin. She has subsequently been switched to Plavix for GI upset . She has made steady progress with outpatient physical and occupational therapy and is now able to move the left arm above the shoulder but has no useful function in the left fingers. She has been getting Botox injections by Dr. Barbaraann Cao which seems to have helped her pain and spasticity. She however still needs a lot of help from her daughter for bathing, using the bathroom, cocaine and changing her clothes. She is able to ambulate with a cane and has had no recent falls. Her speech and swallowing have improved. She has not had any recurrent stroke or TIA symptoms  ROS:   14 system review of systems is positive for leg swelling, eye itching, activity change, cold and heat intolerance, easy bruising, joint pain, back pain, aching muscles, muscle cramps, walking difficulty, neck pain, depression and anxiety and rash  PMH:  Past Medical History  Diagnosis Date  . Hypertension   . Hypercholesteremia   . Back pain     arthritis  . CVA (cerebral infarction)     Social History:  History   Social History  . Marital Status: Married    Spouse Name: N/A  . Number of Children: N/A  . Years of Education: N/A   Occupational History  . Not on file.   Social History Main Topics  . Smoking status: Former Smoker -- 5 years  . Smokeless tobacco: Never Used  . Alcohol Use:  No  . Drug Use: No  . Sexual Activity: Not on file   Other Topics Concern  . Not on file   Social History Narrative   Lives in [place]    Married. Education: [employer].     Medications:   Current Outpatient Prescriptions on File Prior to Visit  Medication Sig Dispense Refill  . acetaminophen (TYLENOL) 325 MG tablet  Take 650 mg by mouth every 6 (six) hours as needed (pain).     . clotrimazole (LOTRIMIN) 1 % cream Apply 1 application topically 2 (two) times daily. 60 g 1  . famotidine (PEPCID) 10 MG tablet Take 1 tablet (10 mg total) by mouth 2 (two) times daily. (Patient taking differently: Take 10 mg by mouth daily. ) 60 tablet 1  . fish oil-omega-3 fatty acids 1000 MG capsule Take 1 g by mouth daily.     Marland Kitchen loratadine (ALAVERT) 10 MG tablet Take 10 mg by mouth daily as needed for allergies.    . methocarbamol (ROBAXIN) 500 MG tablet Take 500 mg by mouth 3 (three) times daily.    . Multiple Vitamin (MULTIVITAMIN WITH MINERALS) TABS tablet Take 1 tablet by mouth daily.    Vladimir Faster Glycol-Propyl Glycol (SYSTANE OP) Place 1 drop into both eyes daily as needed (dry eyeys).    Marland Kitchen senna-docusate (SENOKOT-S) 8.6-50 MG per tablet Take 3 tablets by mouth 2 (two) times daily. 100 tablet 1   No current facility-administered medications on file prior to visit.    Allergies:   Allergies  Allergen Reactions  . Tizanidine Itching  . Aspirin Nausea And Vomiting    Tolerated baby aspirin, but with more than once per day of full strength for aches and pains - had stomach upset.  No history of PUD/gastric bleeding known.   . Statins Other (See Comments)    Myalgias and chest pain (has tried Lipitor and Pravachol)    Physical Exam General: well developed, well nourished middle aged African-American lady, seated, in no evident distress Head: head normocephalic and atraumatic.  Neck: supple with no carotid or supraclavicular bruits Cardiovascular: regular rate and rhythm, no murmurs Musculoskeletal: no deformity Skin:  no rash/petichiae Vascular:  Normal pulses all extremities Filed Vitals:   09/09/14 1617  BP: 142/71  Pulse: 82   Neurologic Exam Mental Status: Awake and fully alert. Oriented to place and time. Recent and remote memory intact. Attention span, concentration and fund of knowledge appropriate.  Mood and affect appropriate.  Cranial Nerves: Fundoscopic exam reveals sharp disc margins. Pupils equal, briskly reactive to light. Extraocular movements full without nystagmus. Visual fields full to confrontation. Hearing intact. Facial sensation intact. Mild left lower facial weakness. Tongue, palate moves normally and symmetrically.  Motor: Spastic left hemiparesis with 3/5 proximal left approximately and 0/5 distal left upper extremity strength. 4+/5 left lower extremity strength with mild weakness of ankle dorsiflexors and hip flexors. Increased tone on the left with spasticity at the left shoulder and elbow extensors. Sensory.: intact to touch ,pinprick .position and vibratory sensation.  Coordination: Impaired on the left due to weakness and normal on the right  Gait and Station:Spastic hemiplegic gait with circumduction of the left foot. DTRs: 2+ and asymmetric brisker on the left. Toes downgoing.   NIHSS  4 Modified Rankin  3   ASSESSMENT: 41 year African-American lady with embolic right middle cerebral artery cerebral infarction due to occlusion of right middle cerebral artery of embolic etiology without definite identified source s/p endovascular revascularization of an occluded right  middle cerebral artery and dominant inferior division using superselective intra-arterial intracranial Integrilin and 2 passes of solitaire Fr stent retrieval device in November 2015   Patient has made a modest recovery with residual spastic left hemiplegia. Vascular risk factors of hypertension and hyperlipidemia.    PLAN: I had a long d/w patient about his recent stroke, risk for recurrent stroke/TIAs, personally independently reviewed imaging studies and stroke evaluation results and answered questions.Continue Plavix  for secondary stroke prevention and maintain strict control of hypertension with blood pressure goal below 130/90, diabetes with hemoglobin A1c goal below 6.5% and lipids with LDL  cholesterol goal below 100 mg/dL. I also advised the patient to eat a healthy diet with plenty of whole grains, cereals, fruits and vegetables, exercise regularly and maintain ideal body weight. Patient was also advised to consider possible participation in the Jessie trial if interested. She was given information to review at home and call us back if interested. Followup in the future with me in  3 months  Antony Contras, MD Note: This document was prepared with digital dictation and possible smart phrase technology. Any transcriptional errors that result from this process are unintentional

## 2014-09-09 NOTE — Patient Instructions (Addendum)
I had a long d/w patient about his recent stroke, risk for recurrent stroke/TIAs, personally independently reviewed imaging studies and stroke evaluation results and answered questions.Continue Plavix  for secondary stroke prevention and maintain strict control of hypertension with blood pressure goal below 130/90, diabetes with hemoglobin A1c goal below 6.5% and lipids with LDL cholesterol goal below 100 mg/dL. I also advised the patient to eat a healthy diet with plenty of whole grains, cereals, fruits and vegetables, exercise regularly and maintain ideal body weight. Patient was also advised to consider possible participation in the [place] trial if interested. She was given information to review at home and call us back if interested. Followup in the future with me in  3 months. Stroke Prevention Some medical conditions and behaviors are associated with an increased chance of having a stroke. You may prevent a stroke by making healthy choices and managing medical conditions. HOW CAN I REDUCE MY RISK OF HAVING A STROKE?   Stay physically active. Get at least 30 minutes of activity on most or all days.  Do not smoke. It may also be helpful to avoid exposure to secondhand smoke.  Limit alcohol use. Moderate alcohol use is considered to be:  No more than 2 drinks per day for men.  No more than 1 drink per day for nonpregnant women.  Eat healthy foods. This involves:  Eating 5 or more servings of fruits and vegetables a day.  Making dietary changes that address high blood pressure (hypertension), high cholesterol, diabetes, or obesity.  Manage your cholesterol levels.  Making food choices that are high in fiber and low in saturated fat, trans fat, and cholesterol may control cholesterol levels.  Take any prescribed medicines to control cholesterol as directed by your health care provider.  Manage your diabetes.  Controlling your carbohydrate and sugar intake is recommended to manage  diabetes.  Take any prescribed medicines to control diabetes as directed by your health care provider.  Control your hypertension.  Making food choices that are low in salt (sodium), saturated fat, trans fat, and cholesterol is recommended to manage hypertension.  Take any prescribed medicines to control hypertension as directed by your health care provider.  Maintain a healthy weight.  Reducing calorie intake and making food choices that are low in sodium, saturated fat, trans fat, and cholesterol are recommended to manage weight.  Stop drug abuse.  Avoid taking birth control pills.  Talk to your health care provider about the risks of taking birth control pills if you are over 29 years old, smoke, get migraines, or have ever had a blood clot.  Get evaluated for sleep disorders (sleep apnea).  Talk to your health care provider about getting a sleep evaluation if you snore a lot or have excessive sleepiness.  Take medicines only as directed by your health care provider.  For some people, aspirin or blood thinners (anticoagulants) are helpful in reducing the risk of forming abnormal blood clots that can lead to stroke. If you have the irregular heart rhythm of atrial fibrillation, you should be on a blood thinner unless there is a good reason you cannot take them.  Understand all your medicine instructions.  Make sure that other conditions (such as anemia or atherosclerosis) are addressed. SEEK IMMEDIATE MEDICAL CARE IF:   You have sudden weakness or numbness of the face, arm, or leg, especially on one side of the body.  Your face or eyelid droops to one side.  You have sudden confusion.  You  have trouble speaking (aphasia) or understanding.  You have sudden trouble seeing in one or both eyes.  You have sudden trouble walking.  You have dizziness.  You have a loss of balance or coordination.  You have a sudden, severe headache with no known cause.  You have new chest  pain or an irregular heartbeat. Any of these symptoms may represent a serious problem that is an emergency. Do not wait to see if the symptoms will go away. Get medical help at once. Call your local emergency services (911 in [place] Do not drive yourself to the hospital. Document Released: 08/15/2004 Document Revised: 11/22/2013 Document Reviewed: 01/08/2013 [facility] Patient Information 2015 [place], [place]. This information is not intended to replace advice given to you by your health care provider. Make sure you discuss any questions you have with your health care provider.

## 2014-09-12 ENCOUNTER — Encounter: Payer: Self-pay | Admitting: Occupational Therapy

## 2014-09-12 ENCOUNTER — Ambulatory Visit: Payer: Medicare Other | Admitting: Occupational Therapy

## 2014-09-12 DIAGNOSIS — R4189 Other symptoms and signs involving cognitive functions and awareness: Secondary | ICD-10-CM | POA: Diagnosis not present

## 2014-09-12 DIAGNOSIS — H539 Unspecified visual disturbance: Secondary | ICD-10-CM | POA: Diagnosis not present

## 2014-09-12 DIAGNOSIS — G8114 Spastic hemiplegia affecting left nondominant side: Secondary | ICD-10-CM | POA: Diagnosis not present

## 2014-09-12 DIAGNOSIS — M25512 Pain in left shoulder: Secondary | ICD-10-CM | POA: Diagnosis not present

## 2014-09-12 DIAGNOSIS — Z7409 Other reduced mobility: Secondary | ICD-10-CM | POA: Diagnosis not present

## 2014-09-12 DIAGNOSIS — R269 Unspecified abnormalities of gait and mobility: Secondary | ICD-10-CM | POA: Diagnosis not present

## 2014-09-12 NOTE — Patient Instructions (Signed)
While in bed, lying on your back, place left arm out away from your body at about a 45 degree angle - should be pain free. Roll yourself over onto your left side so your right fingertips can touch your left fingertips. Roll yourself back onto your back, leaving your left arm in place on the bed. Repeat 5 times - complete each transition SLOWLY!  After 5 repetitions, you should be able to guide your arm up 5-10 degrees more away from your body. Repeat entire series. Complete each morning before getting up for the day. Complete each evening when returning to bed.  Reviewed with patient and daughter. Exercises should be completed with supervision and set up to guide the arm, and ensure safety when rolling in bed.

## 2014-09-12 NOTE — Therapy (Signed)
[facility] [address] [place], [place], [address] Phone: [phone]   Fax:  [phone]  Occupational Therapy Treatment  Patient Details  Name: Mary Le MRN: [id number] Date of Birth: 02-04-1946 Referring Provider:  [name], MD  Encounter Date: 09/12/2014      OT End of Session - 09/12/14 1719    Visit Number 7   Number of Visits 16   Date for OT Re-Evaluation 10/04/14   Authorization Type medicare - needs G code!   Authorization - Visit Number 7   Authorization - Number of Visits 10   OT Start Time 6962   OT Stop Time 1620   OT Time Calculation (min) 47 min   Activity Tolerance Patient tolerated treatment well   Behavior During Therapy WFL for tasks assessed/performed      Past Medical History  Diagnosis Date  . Hypertension   . Hypercholesteremia   . Back pain     arthritis  . CVA (cerebral infarction)     Past Surgical History  Procedure Laterality Date  . Appendectomy    . Tonsillectomy    . Hemorroidectomy    . Tubal ligation    . Radiology with anesthesia N/A 06/03/2014    Procedure: RADIOLOGY WITH ANESTHESIA;  Surgeon: [name], MD;  Location: [facility];  Service: Radiology;  Laterality: N/A;  . Tee without cardioversion N/A 06/07/2014    Procedure: TRANSESOPHAGEAL ECHOCARDIOGRAM (TEE);  Surgeon: [name], MD;  Location: [facility] ENDOSCOPY;  Service: Cardiovascular;  Laterality: N/A;  . Loop recorder implant  06/07/2014    MDT LINQ implanted by Dr [name] for cryptogenic stroke  . Loop recorder implant N/A 06/07/2014    Procedure: LOOP RECORDER IMPLANT;  Surgeon: [name], MD;  Location: [facility] CATH LAB;  Service: Cardiovascular;  Laterality: N/A;    There were no vitals taken for this visit.  Visit Diagnosis:  Spastic hemiplegia affecting left nondominant side  Pain in joint, shoulder region, left  Impaired visual perception  Impaired cognition       Subjective Assessment - 09/12/14 1655    Symptoms I don't feel confident in the shower   Pertinent History see epic snapshot;  HTN, fluctuating BP since stroke   Currently in Pain? Yes   Pain Score 4    Pain Location Shoulder   Pain Orientation Left   Pain Descriptors / Indicators Aching   Pain Type Chronic pain   Pain Onset More than a month ago   Pain Frequency Intermittent   Aggravating Factors  rapid movement   Pain Relieving Factors positioning, medication                 OT Treatments/Exercises (OP) - 09/12/14 0001    Bed Mobility   Bed Mobility Rolling Left;Left Sidelying to Sit   Rolling Left 4: Min assist   Left Sidelying to Sit 3: Mod assist   ADLs   UB Dressing Patient requires increased time and moderate assistance to don front opening jacket today.  Daughter indicates that they have recently discussed needing to let her experience some frustration in order to gain independence with dressing.     Bathing Patient's daughter brought long handled sponge, adapted sponge to allow increased independence with bathing, especially back.  Patient and daughter both expressing hesitation regarding reducing amount of supervision during bathing (standing). Discussed gradually stepping out of activity to increase comfort.   Neurological Re-education Exercises   Other Exercises 1 Patient with  increased muscle activation with attempts aat any active movement, much better able to grade activiation with guided movement, or in closed chain conditions.   Other Exercises 2 Encouraged "letting go" of muscle tension, and allowing movement to be more natural / less effort.     Other Weight-Bearing Exercises 1 Sidelying to sidearm prop on left, with proximal weight shifts through trunk.  Left arm stable and body mobile to increase joint mobility / range.  Followed with active assisted movement in sitting with decreased reports of discomfort   Other Weight-Bearing Exercises 2 Seated long arm  weight bearing with weight shift biased toward left - emphasis on rotating body over stable arm again to increase joint mobility in closed chain condition   Weight Bearing Technique   Weight Bearing Technique Yes                OT Education - 09/12/14 1715    Education provided Yes   Education Details Shoulder self ROM    Person(s) Educated Patient;Child(ren)   Methods Explanation;Demonstration;Tactile cues;Verbal cues   Comprehension Verbalized understanding;Tactile cues required          OT Short Term Goals - 09/08/14 1537    OT SHORT TERM GOAL #1   Title Pt and daughter will be I with HEP - 2/16/20126   Status Achieved   OT SHORT TERM GOAL #2   Title Pt will require a with back only for UE body bathing in shower   Status Not Met  mod a; daughter washes R side and back   OT SHORT TERM GOAL #3   Title Pt will be close s for LB  bathing in shower   Status Not Met  daughter washes bottom   OT SHORT TERM GOAL #4   Title  Pt will be min afor UB dressing with AE prn   Status Achieved  pt reports she needs a little help with long sleeve shirts.   OT SHORT TERM GOAL #5   Title Pt will be min a for LB dressing with AE prn   Status Achieved   OT SHORT TERM GOAL #6   Title Pt will report no more than 4/10 pain in LUE with HEP   Status Achieved   OT SHORT TERM GOAL #7   Status Achieved           OT Long Term Goals - 09/08/14 1712    OT LONG TERM GOAL #1   Title Pt and daughter will be mod I with updated HEP prn - 10/04/2014   Status On-going   OT LONG TERM GOAL #2   Title Pt will be supervision with simple meal prep at ambulatory level   Status On-going   OT LONG TERM GOAL #3   Title Pt will be mod I with toilet hygiene   Status On-going   OT LONG TERM GOAL #4   Title Pt will be able to read 3 sentence paragraph with strategies with min vc's   Status On-going   OT LONG TERM GOAL #5   Title Pt and daughter will be able to identify 2 strategies to compensate  for memory deficit.   Status On-going   OT LONG TERM GOAL #6   Title Pt will report no greater than 3/10 pain in LUE with HEP   Status On-going   OT LONG TERM GOAL #7   Title Pt will demonstrate ability to use LUE as gross stabilizer with basic self care tasks   Status On-going  Plan - 09/12/14 1719    Clinical Impression Statement Patient showing improvement with reduction in pain in left UE, needs further application to functional movements   Pt will benefit from skilled therapeutic intervention in order to improve on the following deficits (Retired) Decreased activity tolerance;Decreased balance;Decreased cognition;Decreased coordination;Decreased range of motion;Decreased safety awareness;Decreased mobility;Decreased knowledge of use of DME;Decreased strength;Increased edema;Impaired UE functional use;Impaired tone;Pain;Impaired sensation;Impaired perceived functional ability;Improper body mechanics;Obesity   Rehab Potential Good   Clinical Impairments Affecting Rehab Potential pain, spasticity   OT Frequency 2x / week   OT Duration 8 weeks   OT Treatment/Interventions Self-care/ADL training;Ultrasound;Moist Heat;Fluidtherapy;Therapeutic exercise;Neuromuscular education;Energy conservation;Manual Therapy;Functional Mobility Training;DME and/or AE instruction;Passive range of motion;Visual/perceptual remediation/compensation;Cognitive remediation/compensation;Therapeutic activities;Splinting;Patient/family education;Balance training   Plan Neuromuscular re-education closed to open chain, followed by functional use of Left UE in both sitting and standing   OT Home Exercise Plan table slides, self ROM shoulder flexion in supine, P/ROM elbow flexion/ extension   Consulted and Agree with Plan of Care Patient;Family member/caregiver   Family Member Consulted daughter        Problem List Patient Active Problem List   Diagnosis Date Noted  . Spastic hemiplegia affecting  nondominant side 07/25/2014  . 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 Left hemiparesis 06/10/2014  . Left-sided neglect 06/10/2014  . 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 Morbid obesity- BMI 40 06/06/2014  . Acute respiratory failure with hypoxia 06/05/2014  . Cerebral infarction due to occlusion of right middle cerebral artery 06/03/2014  . Cerebral infarct 06/03/2014  . Altered mental status 06/03/2014  . GERD (gastroesophageal reflux disease) 04/12/2014  . Benign essential HTN 02/07/2014  . Dyslipidemia- statin intol 02/07/2014  . Chest pain- Low risk Myoview July 2015 02/06/2014   Antony Salmon, OTR/L 09/12/2014 5:24 PM Phone: [phone] Fax: [phone]  [facility] [address] [place], [place], [address] Phone: [phone]   Fax:  [phone]

## 2014-09-14 ENCOUNTER — Ambulatory Visit: Payer: Medicare Other | Admitting: Physical Therapy

## 2014-09-14 ENCOUNTER — Encounter: Payer: Self-pay | Admitting: Physical Therapy

## 2014-09-14 DIAGNOSIS — G8114 Spastic hemiplegia affecting left nondominant side: Secondary | ICD-10-CM | POA: Diagnosis not present

## 2014-09-14 DIAGNOSIS — M25512 Pain in left shoulder: Secondary | ICD-10-CM | POA: Diagnosis not present

## 2014-09-14 DIAGNOSIS — Z7409 Other reduced mobility: Secondary | ICD-10-CM

## 2014-09-14 DIAGNOSIS — H539 Unspecified visual disturbance: Secondary | ICD-10-CM | POA: Diagnosis not present

## 2014-09-14 DIAGNOSIS — R269 Unspecified abnormalities of gait and mobility: Secondary | ICD-10-CM | POA: Diagnosis not present

## 2014-09-14 DIAGNOSIS — R4189 Other symptoms and signs involving cognitive functions and awareness: Secondary | ICD-10-CM | POA: Diagnosis not present

## 2014-09-14 NOTE — Therapy (Signed)
[facility] [address] [place], [place], [address] Phone: [phone]   Fax:  [phone]  Physical Therapy Treatment  Patient Details  Name: Mary Le MRN: [id number] Date of Birth: 06-18-1946 Referring Provider:  [name], MD  Encounter Date: 09/14/2014      PT End of Session - 09/14/14 1257    Visit Number 7   Number of Visits 16   Date for PT Re-Evaluation 10/07/14   PT Start Time 1448   PT Stop Time 1233   PT Time Calculation (min) 44 min   Equipment Utilized During Treatment Gait belt   Activity Tolerance Patient tolerated treatment well   Behavior During Therapy [facility] for tasks assessed/performed      Past Medical History  Diagnosis Date  . Hypertension   . Hypercholesteremia   . Back pain     arthritis  . CVA (cerebral infarction)     Past Surgical History  Procedure Laterality Date  . Appendectomy    . Tonsillectomy    . Hemorroidectomy    . Tubal ligation    . Radiology with anesthesia N/A 06/03/2014    Procedure: RADIOLOGY WITH ANESTHESIA;  Surgeon: [name], MD;  Location: [facility];  Service: Radiology;  Laterality: N/A;  . Tee without cardioversion N/A 06/07/2014    Procedure: TRANSESOPHAGEAL ECHOCARDIOGRAM (TEE);  Surgeon: [name], MD;  Location: [facility] ENDOSCOPY;  Service: Cardiovascular;  Laterality: N/A;  . Loop recorder implant  06/07/2014    MDT LINQ implanted by Dr [name] for cryptogenic stroke  . Loop recorder implant N/A 06/07/2014    Procedure: LOOP RECORDER IMPLANT;  Surgeon: [name], MD;  Location: [facility] CATH LAB;  Service: Cardiovascular;  Laterality: N/A;    There were no vitals taken for this visit.  Visit Diagnosis:  Abnormality of gait  Impaired functional mobility and activity tolerance      Subjective Assessment - 09/14/14 1154    Symptoms No changes or falls.   Currently in Pain? No/denies          [facility] Adult PT  Treatment/Exercise - 09/14/14 0001    Bed Mobility   Bed Mobility Rolling Right;Rolling Left;Left Sidelying to Sit   Rolling Right 5: Supervision   Rolling Right Details (indicate cue type and reason) cues for technique   Rolling Left 5: Supervision   Rolling Left Details (indicate cue type and reason) cues for technique   Left Sidelying to Sit 4: Min assist;HOB flat;Other (comment)  pt reports she can perform I at home with rail   Ambulation/Gait   Ambulation/Gait Yes   Ambulation/Gait Assistance 5: Supervision   Ambulation/Gait Assistance Details cues for heel strike and increase step length on L   Ambulation Distance (Feet) 400 Feet  120 x 1   Assistive device Straight cane   Gait Pattern Step-through pattern;Decreased step length - right;Decreased stance time - left;Shuffle   Ambulation Surface Level;Indoor   Knee/Hip Exercises: Standing   Other Standing Knee Exercises sit to stand from mat x 10 and again x 10 with RLE on 4"step working on LLE weight bearing   Knee/Hip Exercises: Supine   Heel Slides AROM;Left;10 reps   Heel Slides Limitations focused on hip flexion extension without "slide"   Hip Adduction Isometric Both;10 reps   Bridges Both;Left;10 reps;AROM   Straight Leg Raises Left;10 reps   Other Supine Knee Exercises supine L hip abd/add x 10 with slide board and pillow case to complete range  Knee/Hip Exercises: Sidelying   Clams 10 in R sidelying for L LE            PT Short Term Goals - 08/10/14 3235    PT SHORT TERM GOAL #1   Title The patient will return demo HEP indep with written cues.  Target date 09/09/2014   Time 4   Period Weeks   PT SHORT TERM GOAL #2   Title The patient will improve Berg score from 27/56 up to 35/56 to demo decreasing risk for falls.  Target date 09/09/2014   Time 4   Period Weeks   PT SHORT TERM GOAL #3   Title The patient will improve gait speed from 1.44 f t/sec up to 1.8 ft/sec to demo decreasing risk for falls.  Target date  09/09/2014   Time 4   Period Weeks   PT SHORT TERM GOAL #4   Title The patient will ambulate household surfaces without a device independently x 400 ft for improved independence in the home.  Target date 09/09/2014   Time 4   Period Weeks   PT SHORT TERM GOAL #5   Title The patient will move sit<>supine to the right and left sides independently.  Target date 09/09/2014   Time 4   Period Weeks           PT Long Term Goals - 08/10/14 0839    PT LONG TERM GOAL #1   Title The patient will verbalize understanding of CVA risk factors/warning signs.  [place] date 10/08/2014   Time 8   Period Weeks   PT LONG TERM GOAL #2   Title The patient will improve Berg score from 27/56 up to 40/56 to demo decreased risk for falls.  Target date 10/08/2014   Time 8   Period Weeks   PT LONG TERM GOAL #3   Title The patient will improve SIS: mobility from 33% up to 45%.  Target date 10/08/2014   Time 8   Period Weeks   PT LONG TERM GOAL #4   Title The patient will improve gait speed from 1.44 ft/sec up to 2.3 ft/sec to demo improving functional ambulation. Target date 10/08/2014   Time 8   Period Weeks   PT LONG TERM GOAL #5   Title The patient will negotiate 4 steps modified indep with reciprocal pattern and one HR.   Target date 10/08/2014   Time 8   Period Weeks   Additional Long Term Goals   Additional Long Term Goals Yes   PT LONG TERM GOAL #6   Title The patient will negotiate level community surfaces without a device x 5 minutes independently for improved community mobility.  Target date 10/08/2014   Time 8   Period Weeks               Plan - 09/14/14 1259    Clinical Impression Statement Pt did not meet STG # 5 as she needs verbal cues and rail.   Pt will benefit from skilled therapeutic intervention in order to improve on the following deficits Decreased balance;Difficulty walking;Postural dysfunction;Decreased strength;Decreased endurance;Decreased activity tolerance;Decreased  mobility;Pain;Abnormal gait   Rehab Potential Good   PT Frequency 2x / week   PT Duration 8 weeks   PT Treatment/Interventions Gait training;Therapeutic exercise;Patient/family education;Balance training;Stair training;Neuromuscular re-education;Therapeutic activities   PT Next Visit Plan L dorsiflexion with resistance band, gait working on heel strike on L, balance activites   Consulted and Agree with Plan of Care Patient;Family member/caregiver  Family Member Consulted daughter        Problem List Patient Active Problem List   Diagnosis Date Noted  . Spastic hemiplegia affecting nondominant side 07/25/2014  . 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 Left hemiparesis 06/10/2014  . Left-sided neglect 06/10/2014  . 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 Morbid obesity- BMI 40 06/06/2014  . Acute respiratory failure with hypoxia 06/05/2014  . Cerebral infarction due to occlusion of right middle cerebral artery 06/03/2014  . Cerebral infarct 06/03/2014  . Altered mental status 06/03/2014  . GERD (gastroesophageal reflux disease) 04/12/2014  . Benign essential HTN 02/07/2014  . Dyslipidemia- statin intol 02/07/2014  . Chest pain- Low risk Myoview July 2015 02/06/2014    Narda Bonds 09/14/2014, 1:03 PM  [facility] [address] [place], [place], [address] Phone: [phone]   Fax:  [place] 09/14/2014 1:03 PM Phone: [phone] Fax: [phone]

## 2014-09-15 ENCOUNTER — Encounter: Payer: Self-pay | Admitting: Internal Medicine

## 2014-09-16 ENCOUNTER — Encounter: Payer: Self-pay | Admitting: Occupational Therapy

## 2014-09-16 ENCOUNTER — Ambulatory Visit: Payer: Medicare Other | Admitting: Occupational Therapy

## 2014-09-16 DIAGNOSIS — H539 Unspecified visual disturbance: Secondary | ICD-10-CM | POA: Diagnosis not present

## 2014-09-16 DIAGNOSIS — R4189 Other symptoms and signs involving cognitive functions and awareness: Secondary | ICD-10-CM | POA: Diagnosis not present

## 2014-09-16 DIAGNOSIS — G8114 Spastic hemiplegia affecting left nondominant side: Secondary | ICD-10-CM

## 2014-09-16 DIAGNOSIS — R269 Unspecified abnormalities of gait and mobility: Secondary | ICD-10-CM | POA: Diagnosis not present

## 2014-09-16 DIAGNOSIS — Z7409 Other reduced mobility: Secondary | ICD-10-CM | POA: Diagnosis not present

## 2014-09-16 DIAGNOSIS — M25512 Pain in left shoulder: Secondary | ICD-10-CM | POA: Diagnosis not present

## 2014-09-16 NOTE — Therapy (Signed)
[facility] [address] [place], [place], [address] Phone: [phone]   Fax:  [phone]  Occupational Therapy Treatment  Patient Details  Name: Mary Le MRN: [id number] Date of Birth: [birth year] Referring Provider:  [name], MD  Encounter Date: 09/16/2014      OT End of Session - 09/16/14 1646    Visit Number 8   Number of Visits 16   Date for OT Re-Evaluation 10/04/14   Authorization Type medicare - needs G code!   Authorization - Visit Number 8   Authorization - Number of Visits 10   OT Start Time 0762   OT Stop Time 1620   OT Time Calculation (min) 48 min   Activity Tolerance Patient tolerated treatment well   Behavior During Therapy WFL for tasks assessed/performed      Past Medical History  Diagnosis Date  . Hypertension   . Hypercholesteremia   . Back pain     arthritis  . CVA (cerebral infarction)     Past Surgical History  Procedure Laterality Date  . Appendectomy    . Tonsillectomy    . Hemorroidectomy    . Tubal ligation    . Radiology with anesthesia N/A 06/03/2014    Procedure: RADIOLOGY WITH ANESTHESIA;  Surgeon: [name], MD;  Location: [facility];  Service: Radiology;  Laterality: N/A;  . Tee without cardioversion N/A 06/07/2014    Procedure: TRANSESOPHAGEAL ECHOCARDIOGRAM (TEE);  Surgeon: [name], MD;  Location: [facility] ENDOSCOPY;  Service: Cardiovascular;  Laterality: N/A;  . Loop recorder implant  06/07/2014    MDT LINQ implanted by Dr [name] for cryptogenic stroke  . Loop recorder implant N/A 06/07/2014    Procedure: LOOP RECORDER IMPLANT;  Surgeon: [name], MD;  Location: [facility] CATH LAB;  Service: Cardiovascular;  Laterality: N/A;    There were no vitals taken for this visit.  Visit Diagnosis:  Spastic hemiplegia affecting left nondominant side      Subjective Assessment - 09/16/14 1635    Symptoms I took a shower by myself (daughter assisted  with pericare)   Pertinent History see epic snapshot;  HTN, fluctuating BP since stroke   Currently in Pain? Yes   Pain Score 5    Pain Location Wrist   Pain Orientation Left   Pain Descriptors / Indicators Guarding;Aching;Throbbing;Tightness   Pain Type Chronic pain   Pain Onset More than a month ago   Pain Frequency Intermittent   Aggravating Factors  rapid movement, initial position change   Pain Relieving Factors sustained stretch, medication                 OT Treatments/Exercises (OP) - 09/16/14 0001    ADLs   ADL Comments Patient very pleased to report that she was able to be left unattended for the majority of her shower as she was seated on solid built in seat.  Patient still reports not yet comfortable to wash her bottom (in standing, or by leaning) without assistance   Neurological Re-education Exercises   Other Exercises 1 Patient with increased muscle tension at all joints, really restricitng PROM.  Gentle stretching followed by light weight bearing helpful in begining to establish joint mobility   Manual Therapy   Manual Therapy Edema management;Joint mobilization;Myofascial release   Edema Management Following gentle joint mobilization provided intermittent edema control   Joint Mobilization Joint mobilization of wrist to forearm and to metacarpals, jencouraged mass grasp with digits to enhance joint range  into flexion at MCP, PIP   Myofascial Release Forearm between radius and ulna to improve supination, pronation, at elbow to increase end range extension                OT Education - 09/16/14 1645    Education provided Yes   Education Details Self range of motion left hand   Person(s) Educated Patient   Methods Explanation;Demonstration   Comprehension Verbalized understanding;Returned demonstration          OT Short Term Goals - 09/08/14 1537    OT SHORT TERM GOAL #1   Title Pt and daughter will be I with HEP - 2/16/20126   Status Achieved    OT SHORT TERM GOAL #2   Title Pt will require a with back only for UE body bathing in shower   Status Not Met  mod a; daughter washes R side and back   OT SHORT TERM GOAL #3   Title Pt will be close s for LB  bathing in shower   Status Not Met  daughter washes bottom   OT SHORT TERM GOAL #4   Title  Pt will be min afor UB dressing with AE prn   Status Achieved  pt reports she needs a little help with long sleeve shirts.   OT SHORT TERM GOAL #5   Title Pt will be min a for LB dressing with AE prn   Status Achieved   OT SHORT TERM GOAL #6   Title Pt will report no more than 4/10 pain in LUE with HEP   Status Achieved   OT SHORT TERM GOAL #7   Status Achieved           OT Long Term Goals - 09/08/14 1712    OT LONG TERM GOAL #1   Title Pt and daughter will be mod I with updated HEP prn - 10/04/2014   Status On-going   OT LONG TERM GOAL #2   Title Pt will be supervision with simple meal prep at ambulatory level   Status On-going   OT LONG TERM GOAL #3   Title Pt will be mod I with toilet hygiene   Status On-going   OT LONG TERM GOAL #4   Title Pt will be able to read 3 sentence paragraph with strategies with min vc's   Status On-going   OT LONG TERM GOAL #5   Title Pt and daughter will be able to identify 2 strategies to compensate for memory deficit.   Status On-going   OT LONG TERM GOAL #6   Title Pt will report no greater than 3/10 pain in LUE with HEP   Status On-going   OT LONG TERM GOAL #7   Title Pt will demonstrate ability to use LUE as gross stabilizer with basic self care tasks   Status On-going               Plan - 09/16/14 1646    Clinical Impression Statement Patient showing steady improvement in joint motion and tolerance of movement in left upper extremity.  Prgressing well toward goals   Pt will benefit from skilled therapeutic intervention in order to improve on the following deficits (Retired) Decreased activity tolerance;Decreased  balance;Decreased cognition;Decreased coordination;Decreased range of motion;Decreased safety awareness;Decreased mobility;Decreased knowledge of use of DME;Decreased strength;Increased edema;Impaired UE functional use;Impaired tone;Pain;Impaired sensation;Impaired perceived functional ability;Improper body mechanics;Obesity   Rehab Potential Good   Clinical Impairments Affecting Rehab Potential pain, spasticity   OT Frequency 2x /  week   OT Duration 8 weeks   OT Treatment/Interventions Self-care/ADL training;Ultrasound;Moist Heat;Fluidtherapy;Therapeutic exercise;Neuromuscular education;Energy conservation;Manual Therapy;Functional Mobility Training;DME and/or AE instruction;Passive range of motion;Visual/perceptual remediation/compensation;Cognitive remediation/compensation;Therapeutic activities;Splinting;Patient/family education;Balance training   Plan Neuromuscular re-education, manual therapy for alignment and activation distal LEFT ue   OT Home Exercise Plan self ROM hand   Consulted and Agree with Plan of Care Patient        Problem List Patient Active Problem List   Diagnosis Date Noted  . Spastic hemiplegia affecting nondominant side 07/25/2014  . 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 Left hemiparesis 06/10/2014  . Left-sided neglect 06/10/2014  . 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 Morbid obesity- BMI 40 06/06/2014  . Acute respiratory failure with hypoxia 06/05/2014  . Cerebral infarction due to occlusion of right middle cerebral artery 06/03/2014  . Cerebral infarct 06/03/2014  . Altered mental status 06/03/2014  . GERD (gastroesophageal reflux disease) 04/12/2014  . Benign essential HTN 02/07/2014  . Dyslipidemia- statin intol 02/07/2014  . Chest pain- Low risk Myoview July 2015 02/06/2014    Mariah Milling, OTR/L 09/16/2014, 4:49 PM  [facility] [address] [place], [place], [address] Phone: [phone]   Fax:  [phone]

## 2014-09-16 NOTE — Patient Instructions (Signed)
Instructed patient to complete self range of motion to left hand. Complete these every hour - at least 20 seconds per stretch 1)  Thread right hand fingers through left hand fingers to increase the space between each finger.   2)  Wrap your right hand over the top of your left hand and help your fingers to all bend down toward your palm.  You should feel a stretch, but not pain.  Do not use either of these grips to raise your left hand up.

## 2014-09-22 ENCOUNTER — Ambulatory Visit: Payer: Medicare Other | Admitting: Occupational Therapy

## 2014-09-22 ENCOUNTER — Ambulatory Visit: Payer: Medicare Other | Attending: Physical Medicine & Rehabilitation | Admitting: Physical Therapy

## 2014-09-22 ENCOUNTER — Other Ambulatory Visit (HOSPITAL_COMMUNITY): Payer: Self-pay | Admitting: Interventional Radiology

## 2014-09-22 ENCOUNTER — Encounter: Payer: Self-pay | Admitting: Occupational Therapy

## 2014-09-22 DIAGNOSIS — I639 Cerebral infarction, unspecified: Secondary | ICD-10-CM

## 2014-09-22 DIAGNOSIS — R269 Unspecified abnormalities of gait and mobility: Secondary | ICD-10-CM | POA: Diagnosis not present

## 2014-09-22 DIAGNOSIS — H539 Unspecified visual disturbance: Secondary | ICD-10-CM | POA: Diagnosis not present

## 2014-09-22 DIAGNOSIS — G8114 Spastic hemiplegia affecting left nondominant side: Secondary | ICD-10-CM | POA: Diagnosis not present

## 2014-09-22 DIAGNOSIS — Z7409 Other reduced mobility: Secondary | ICD-10-CM | POA: Insufficient documentation

## 2014-09-22 DIAGNOSIS — R4189 Other symptoms and signs involving cognitive functions and awareness: Secondary | ICD-10-CM

## 2014-09-22 DIAGNOSIS — M25512 Pain in left shoulder: Secondary | ICD-10-CM | POA: Insufficient documentation

## 2014-09-22 NOTE — Patient Instructions (Signed)
Instructed patient to work on single leg stance; tandem standing and box step for HEP; she said she would do and has supervision to be safe; also reminded her to get left arm supported as much as possible to help with the shoulder pain

## 2014-09-22 NOTE — Therapy (Signed)
[facility] [address] [place], [place], [address] Phone: [phone]   Fax:  [phone]  Occupational Therapy Treatment  Patient Details  Name: Mary Le MRN: [id number] Date of Birth: [birth year] Referring Provider:  [name], MD  Encounter Date: 09/22/2014      OT End of Session - 09/22/14 1718    Visit Number 9   Number of Visits 16   Date for OT Re-Evaluation 10/04/14   Authorization Type medicare - needs G code!   OT Start Time 1400   OT Stop Time 1443   OT Time Calculation (min) 43 min   Activity Tolerance Patient tolerated treatment well      Past Medical History  Diagnosis Date  . Hypertension   . Hypercholesteremia   . Back pain     arthritis  . CVA (cerebral infarction)     Past Surgical History  Procedure Laterality Date  . Appendectomy    . Tonsillectomy    . Hemorroidectomy    . Tubal ligation    . Radiology with anesthesia N/A 06/03/2014    Procedure: RADIOLOGY WITH ANESTHESIA;  Surgeon: [name], MD;  Location: [facility];  Service: Radiology;  Laterality: N/A;  . Tee without cardioversion N/A 06/07/2014    Procedure: TRANSESOPHAGEAL ECHOCARDIOGRAM (TEE);  Surgeon: [name], MD;  Location: [facility] ENDOSCOPY;  Service: Cardiovascular;  Laterality: N/A;  . Loop recorder implant  06/07/2014    MDT LINQ implanted by Dr [name] for cryptogenic stroke  . Loop recorder implant N/A 06/07/2014    Procedure: LOOP RECORDER IMPLANT;  Surgeon: [name], MD;  Location: [facility] CATH LAB;  Service: Cardiovascular;  Laterality: N/A;    There were no vitals taken for this visit.  Visit Diagnosis:  Impaired functional mobility and activity tolerance  Impaired cognition      Subjective Assessment - 09/22/14 1407    Symptoms I can now do all my washing myself.     Pertinent History see epic snapshot;  HTN, fluctuating BP since stroke   Currently in Pain? Yes   Pain Score 4     Pain Location Shoulder   Pain Orientation Left   Pain Descriptors / Indicators Aching   Pain Type Chronic pain   Pain Onset More than a month ago   Pain Frequency Intermittent   Aggravating Factors  pt states she was out yesterday and on her feet all day and "I let it hang all day so that is why it hurts"   Pain Relieving Factors resting and supporting arm   Multiple Pain Sites No                 OT Treatments/Exercises (OP) - 09/22/14 0001    ADLs   Cooking Pt able to complete simple hot meal prep at ambulatory level without a device with supervision for balance and safety.  Pt with improvment in L neglect and with increased time is able to "find" things in her environment. Pt often independently verbalizes that "If I can't find it, it is probably on my left"  Pt tolerated approximately 25 minutes of ambulatory activity before requiring a rest break.   Neurological Re-education Exercises   Other Exercises 1 During rest from cooking activity neuro re ed to address elbow flexion/extension and PROM to decrease tone/tightness for finger flexion                  OT Short Term Goals - 09/22/14 1720  OT SHORT TERM GOAL #1   Title Pt and daughter will be I with HEP - 2/16/20126   Status Achieved   OT SHORT TERM GOAL #2   Title Pt will require a with back only for UE body bathing in shower   Status Not Met  mod a; daughter washes R side and back   OT SHORT TERM GOAL #3   Title Pt will be close s for LB  bathing in shower   Status Not Met  daughter washes bottom   OT SHORT TERM GOAL #4   Title  Pt will be min afor UB dressing with AE prn   Status Achieved  pt reports she needs a little help with long sleeve shirts.   OT SHORT TERM GOAL #5   Title Pt will be min a for LB dressing with AE prn   Status Achieved   OT SHORT TERM GOAL #6   Title Pt will report no more than 4/10 pain in LUE with HEP   Status Achieved   OT SHORT TERM GOAL #7   Status Achieved            OT Long Term Goals - 09/22/14 1720    OT LONG TERM GOAL #1   Title Pt and daughter will be mod I with updated HEP prn - 10/04/2014   Status On-going   OT LONG TERM GOAL #2   Title Pt will be supervision with simple meal prep at ambulatory level   Status Achieved   OT LONG TERM GOAL #3   Title Pt will be mod I with toilet hygiene   Status Achieved   OT LONG TERM GOAL #4   Title Pt will be able to read 3 sentence paragraph with strategies with min vc's   Status On-going   OT LONG TERM GOAL #5   Title Pt and daughter will be able to identify 2 strategies to compensate for memory deficit.   Status Achieved   OT LONG TERM GOAL #6   Title Pt will report no greater than 3/10 pain in LUE with HEP   Status On-going   OT LONG TERM GOAL #7   Title Pt will demonstrate ability to use LUE as gross stabilizer with basic self care tasks   Status Achieved               Plan - 09/22/14 1718    Clinical Impression Statement Pt with progress toward goals - reports she is bathing with supervision only now, and completing toilet hygience mod I.  Cooking today with supervision.   Pt will benefit from skilled therapeutic intervention in order to improve on the following deficits (Retired) Decreased activity tolerance;Decreased balance;Decreased cognition;Decreased coordination;Decreased range of motion;Decreased safety awareness;Decreased mobility;Decreased knowledge of use of DME;Decreased strength;Increased edema;Impaired UE functional use;Impaired tone;Pain;Impaired sensation;Impaired perceived functional ability;Improper body mechanics;Obesity   Rehab Potential Good   Clinical Impairments Affecting Rehab Potential pain, spasticity   OT Frequency 2x / week   OT Duration 8 weeks   OT Treatment/Interventions Self-care/ADL training;Ultrasound;Moist Heat;Fluidtherapy;Therapeutic exercise;Neuromuscular education;Energy conservation;Manual Therapy;Functional Mobility Training;DME and/or AE  instruction;Passive range of motion;Visual/perceptual remediation/compensation;Cognitive remediation/compensation;Therapeutic activities;Splinting;Patient/family education;Balance training   Plan Address remaining LTG's   Consulted and Agree with Plan of Care Patient   Family Member Consulted daughter        Problem List Patient Active Problem List   Diagnosis Date Noted  . Spastic hemiplegia affecting nondominant side 07/25/2014  . 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  Left hemiparesis 06/10/2014  . Left-sided neglect 06/10/2014  . 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 Morbid obesity- BMI 40 06/06/2014  . Acute respiratory failure with hypoxia 06/05/2014  . Cerebral infarction due to occlusion of right middle cerebral artery 06/03/2014  . Cerebral infarct 06/03/2014  . Altered mental status 06/03/2014  . GERD (gastroesophageal reflux disease) 04/12/2014  . Benign essential HTN 02/07/2014  . Dyslipidemia- statin intol 02/07/2014  . Chest pain- Low risk Myoview July 2015 02/06/2014    Quay Burow, OTR/L 09/22/2014, 5:23 PM  [facility] [address] [place], [place], [address] Phone: [phone]   Fax:  [phone]

## 2014-09-22 NOTE — Therapy (Signed)
[facility] [address] [place], [place], [address] Phone: [phone]   Fax:  [phone]  Physical Therapy Treatment  Patient Details  Name: Mary Le MRN: [id number] Date of Birth: 04/12/1946 Referring Provider:  [name], MD  Encounter Date: 09/22/2014      PT End of Session - 09/22/14 1315    Visit Number 8   Number of Visits 16   Date for PT Re-Evaluation 10/07/14   PT Start Time 4401   PT Stop Time 1400   PT Time Calculation (min) 45 min   Equipment Utilized During Treatment Gait belt   Activity Tolerance Patient tolerated treatment well   Behavior During Therapy [facility] for tasks assessed/performed      Past Medical History  Diagnosis Date  . Hypertension   . Hypercholesteremia   . Back pain     arthritis  . CVA (cerebral infarction)     Past Surgical History  Procedure Laterality Date  . Appendectomy    . Tonsillectomy    . Hemorroidectomy    . Tubal ligation    . Radiology with anesthesia N/A 06/03/2014    Procedure: RADIOLOGY WITH ANESTHESIA;  Surgeon: [name], MD;  Location: [facility];  Service: Radiology;  Laterality: N/A;  . Tee without cardioversion N/A 06/07/2014    Procedure: TRANSESOPHAGEAL ECHOCARDIOGRAM (TEE);  Surgeon: [name], MD;  Location: [facility] ENDOSCOPY;  Service: Cardiovascular;  Laterality: N/A;  . Loop recorder implant  06/07/2014    MDT LINQ implanted by Dr [name] for cryptogenic stroke  . Loop recorder implant N/A 06/07/2014    Procedure: LOOP RECORDER IMPLANT;  Surgeon: [name], MD;  Location: [facility] CATH LAB;  Service: Cardiovascular;  Laterality: N/A;    There were no vitals taken for this visit.  Visit Diagnosis:  Abnormality of gait  Impaired functional mobility and activity tolerance  Spastic hemiplegia affecting left nondominant side      Subjective Assessment - 09/22/14 1326    Symptoms No falls; left shoulder is hurting    Currently in Pain? Yes   Pain Score 5    Pain Location Shoulder   Pain Orientation Left   Pain Descriptors / Indicators Aching   Aggravating Factors  typical pain at lateral shoulder with poor neuro contorl arm (s/p CVA);  positive sulcus   Pain Relieving Factors support the weight of arm;       FSST; 20sec  25 sec17 sec;  progressed doing the four square step ex;s with increased step size, less UE support and vq's not to use vision; multi bouts of step reflex exercise Tandem stance at counter progress with less UE support and head mvt Single leg stance with one hand hold  30 sec bouts multi bouts; progress less UE support                     PT Education - 09/22/14 1404    Education provided Yes   Education Details sls balance ex's    Person(s) Educated Patient   Methods Demonstration   Comprehension Returned demonstration          PT Short Term Goals - 08/10/14 0832    PT SHORT TERM GOAL #1   Title The patient will return demo HEP indep with written cues.  Target date 09/09/2014   Time 4   Period Weeks   PT SHORT TERM GOAL #2   Title The patient will improve Berg score from 27/56  up to 35/56 to demo decreasing risk for falls.  Target date 09/09/2014   Time 4   Period Weeks   PT SHORT TERM GOAL #3   Title The patient will improve gait speed from 1.44 f t/sec up to 1.8 ft/sec to demo decreasing risk for falls.  Target date 09/09/2014   Time 4   Period Weeks   PT SHORT TERM GOAL #4   Title The patient will ambulate household surfaces without a device independently x 400 ft for improved independence in the home.  Target date 09/09/2014   Time 4   Period Weeks   PT SHORT TERM GOAL #5   Title The patient will move sit<>supine to the right and left sides independently.  Target date 09/09/2014   Time 4   Period Weeks           PT Long Term Goals - 08/10/14 0839    PT LONG TERM GOAL #1   Title The patient will verbalize understanding of CVA risk  factors/warning signs.  TArget date 10/08/2014   Time 8   Period Weeks   PT LONG TERM GOAL #2   Title The patient will improve Berg score from 27/56 up to 40/56 to demo decreased risk for falls.  Target date 10/08/2014   Time 8   Period Weeks   PT LONG TERM GOAL #3   Title The patient will improve SIS: mobility from 33% up to 45%.  Target date 10/08/2014   Time 8   Period Weeks   PT LONG TERM GOAL #4   Title The patient will improve gait speed from 1.44 ft/sec up to 2.3 ft/sec to demo improving functional ambulation. Target date 10/08/2014   Time 8   Period Weeks   PT LONG TERM GOAL #5   Title The patient will negotiate 4 steps modified indep with reciprocal pattern and one HR.   Target date 10/08/2014   Time 8   Period Weeks   Additional Long Term Goals   Additional Long Term Goals Yes   PT LONG TERM GOAL #6   Title The patient will negotiate level community surfaces without a device x 5 minutes independently for improved community mobility.  Target date 10/08/2014   Time 8   Period Weeks               Plan - 09/22/14 1413    Clinical Impression Statement Pt demod  a functional step reflex with FSST ; slow time at 17 seconds; mod fall risk; she is walking with a safe gait pattern using the spc   Pt will benefit from skilled therapeutic intervention in order to improve on the following deficits Decreased endurance;Decreased balance   Rehab Potential Good   PT Next Visit Plan review HEP and advance as tolerated; cont to work with L heel strike during gait   Consulted and Agree with Plan of Care Patient        Problem List Patient Active Problem List   Diagnosis Date Noted  . Spastic hemiplegia affecting nondominant side 07/25/2014  . 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 Left hemiparesis 06/10/2014  . Left-sided neglect 06/10/2014  . 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 Morbid obesity- BMI 40  06/06/2014  . Acute respiratory failure with hypoxia 06/05/2014  . Cerebral infarction due to occlusion of right middle cerebral artery 06/03/2014  . Cerebral infarct 06/03/2014  . Altered mental status 06/03/2014  . GERD (gastroesophageal  reflux disease) 04/12/2014  . Benign essential HTN 02/07/2014  . Dyslipidemia- statin intol 02/07/2014  . Chest pain- Low risk Myoview July 2015 02/06/2014    Jana Hakim 09/22/2014, 2:21 PM  [facility] [address] [place], [place], [address] Phone: [phone]   Fax:  [phone]

## 2014-09-23 ENCOUNTER — Ambulatory Visit: Payer: Medicare Other | Admitting: Physical Therapy

## 2014-09-23 ENCOUNTER — Encounter: Payer: Self-pay | Admitting: Physical Therapy

## 2014-09-23 ENCOUNTER — Ambulatory Visit: Payer: Medicare Other | Admitting: Occupational Therapy

## 2014-09-23 ENCOUNTER — Encounter: Payer: Self-pay | Admitting: Internal Medicine

## 2014-09-23 DIAGNOSIS — R4189 Other symptoms and signs involving cognitive functions and awareness: Secondary | ICD-10-CM | POA: Diagnosis not present

## 2014-09-23 DIAGNOSIS — M25512 Pain in left shoulder: Secondary | ICD-10-CM

## 2014-09-23 DIAGNOSIS — G8114 Spastic hemiplegia affecting left nondominant side: Secondary | ICD-10-CM

## 2014-09-23 DIAGNOSIS — Z7409 Other reduced mobility: Secondary | ICD-10-CM

## 2014-09-23 DIAGNOSIS — R269 Unspecified abnormalities of gait and mobility: Secondary | ICD-10-CM

## 2014-09-23 DIAGNOSIS — H539 Unspecified visual disturbance: Secondary | ICD-10-CM | POA: Diagnosis not present

## 2014-09-23 NOTE — Therapy (Signed)
[facility] [address] [place], [place], [address] Phone: [phone]   Fax:  [phone]  Physical Therapy Treatment  Patient Details  Name: Mary Le MRN: [id number] Date of Birth: May 28, 1946 Referring Provider:  [name], MD  Encounter Date: 09/23/2014      PT End of Session - 09/22/14 1315    Visit Number 8   Number of Visits 16   Date for PT Re-Evaluation 10/07/14   PT Start Time 2542   PT Stop Time 1400   PT Time Calculation (min) 45 min   Equipment Utilized During Treatment Gait belt   Activity Tolerance Patient tolerated treatment well   Behavior During Therapy [facility] for tasks assessed/performed      Past Medical History  Diagnosis Date  . Hypertension   . Hypercholesteremia   . Back pain     arthritis  . CVA (cerebral infarction)     Past Surgical History  Procedure Laterality Date  . Appendectomy    . Tonsillectomy    . Hemorroidectomy    . Tubal ligation    . Radiology with anesthesia N/A 06/03/2014    Procedure: RADIOLOGY WITH ANESTHESIA;  Surgeon: [name], MD;  Location: [facility];  Service: Radiology;  Laterality: N/A;  . Tee without cardioversion N/A 06/07/2014    Procedure: TRANSESOPHAGEAL ECHOCARDIOGRAM (TEE);  Surgeon: [name], MD;  Location: [facility] ENDOSCOPY;  Service: Cardiovascular;  Laterality: N/A;  . Loop recorder implant  06/07/2014    MDT LINQ implanted by Dr [name] for cryptogenic stroke  . Loop recorder implant N/A 06/07/2014    Procedure: LOOP RECORDER IMPLANT;  Surgeon: [name], MD;  Location: [facility] CATH LAB;  Service: Cardiovascular;  Laterality: N/A;    There were no vitals taken for this visit.  Visit Diagnosis:  No diagnosis found.      Subjective Assessment - 09/23/14 1359    Currently in Pain? Yes   Pain Score 3    Pain Location Shoulder   Pain Orientation Left   Pain Descriptors / Indicators Aching   Pain Type Chronic pain   Pain  Onset More than a month ago   Pain Frequency Intermittent   Pain Descriptors / Indicators Aching   Pain Frequency Intermittent       Neuro muscular re ed Walking along narrow foam in parallel bars ; forward and back -tandem stepping; cga/mina; Stepping forward back  And tandem standing in parallel bars with only left hand for support (min support for left hand) Rocker board in bars; x 2 bouts Four square step; x 5 min; (gait belt / cga) Sci fit x 6 min                       PT Education - 09/22/14 1404    Education provided Yes   Education Details sls balance ex's    Person(s) Educated Patient   Methods Demonstration   Comprehension Returned demonstration          PT Short Term Goals - 08/10/14 0832    PT SHORT TERM GOAL #1   Title The patient will return demo HEP indep with written cues.  Target date 09/09/2014   Time 4   Period Weeks   PT SHORT TERM GOAL #2   Title The patient will improve Berg score from 27/56 up to 35/56 to demo decreasing risk for falls.  Target date 09/09/2014   Time 4   Period  Weeks   PT SHORT TERM GOAL #3   Title The patient will improve gait speed from 1.44 f t/sec up to 1.8 ft/sec to demo decreasing risk for falls.  Target date 09/09/2014   Time 4   Period Weeks   PT SHORT TERM GOAL #4   Title The patient will ambulate household surfaces without a device independently x 400 ft for improved independence in the home.  Target date 09/09/2014   Time 4   Period Weeks   PT SHORT TERM GOAL #5   Title The patient will move sit<>supine to the right and left sides independently.  Target date 09/09/2014   Time 4   Period Weeks           PT Long Term Goals - 08/10/14 0839    PT LONG TERM GOAL #1   Title The patient will verbalize understanding of CVA risk factors/warning signs.  TArget date 10/08/2014   Time 8   Period Weeks   PT LONG TERM GOAL #2   Title The patient will improve Berg score from 27/56 up to 40/56 to demo decreased  risk for falls.  Target date 10/08/2014   Time 8   Period Weeks   PT LONG TERM GOAL #3   Title The patient will improve SIS: mobility from 33% up to 45%.  Target date 10/08/2014   Time 8   Period Weeks   PT LONG TERM GOAL #4   Title The patient will improve gait speed from 1.44 ft/sec up to 2.3 ft/sec to demo improving functional ambulation. Target date 10/08/2014   Time 8   Period Weeks   PT LONG TERM GOAL #5   Title The patient will negotiate 4 steps modified indep with reciprocal pattern and one HR.   Target date 10/08/2014   Time 8   Period Weeks   Additional Long Term Goals   Additional Long Term Goals Yes   PT LONG TERM GOAL #6   Title The patient will negotiate level community surfaces without a device x 5 minutes independently for improved community mobility.  Target date 10/08/2014   Time 8   Period Weeks               Plan - 09/22/14 1413    Clinical Impression Statement Pt demod  a functional step reflex with FSST ; slow time at 17 seconds; mod fall risk; she is walking with a safe gait pattern using the spc   Pt will benefit from skilled therapeutic intervention in order to improve on the following deficits Decreased endurance;Decreased balance   Rehab Potential Good   PT Next Visit Plan review HEP and advance as tolerated; cont to work with L heel strike during gait   Consulted and Agree with Plan of Care Patient        Problem List Patient Active Problem List   Diagnosis Date Noted  . Spastic hemiplegia affecting nondominant side 07/25/2014  . 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 Left hemiparesis 06/10/2014  . Left-sided neglect 06/10/2014  . 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 Morbid obesity- BMI 40 06/06/2014  . Acute respiratory failure with hypoxia 06/05/2014  . Cerebral infarction due to occlusion of right middle cerebral artery 06/03/2014  . Cerebral infarct 06/03/2014  .  Altered mental status 06/03/2014  . GERD (gastroesophageal reflux disease) 04/12/2014  . Benign essential HTN 02/07/2014  . Dyslipidemia- statin intol 02/07/2014  . Chest pain- Low  risk Myoview July 2015 02/06/2014    Jana Hakim PT DPT 09/23/2014, 2:26 PM  [facility] [address] [place], [place], [address] Phone: [phone]   Fax:  [phone]

## 2014-09-23 NOTE — Therapy (Signed)
[facility] [address] [place], [place], [address] Phone: [phone]   Fax:  [phone]  Occupational Therapy Treatment  Patient Details  Name: Mary Le MRN: [id number] Date of Birth: 02-03-1946 Referring Provider:  [name], MD  Encounter Date: 09/23/2014      OT End of Session - 09/23/14 1458    Visit Number 10   Number of Visits 16   Date for OT Re-Evaluation 10/04/14   Authorization Type medicare - needs G code!   Authorization - Visit Number 10   OT Start Time 1017   OT Stop Time 1530   OT Time Calculation (min) 35 min   Activity Tolerance Patient tolerated treatment well   Behavior During Therapy WFL for tasks assessed/performed      Past Medical History  Diagnosis Date  . Hypertension   . Hypercholesteremia   . Back pain     arthritis  . CVA (cerebral infarction)     Past Surgical History  Procedure Laterality Date  . Appendectomy    . Tonsillectomy    . Hemorroidectomy    . Tubal ligation    . Radiology with anesthesia N/A 06/03/2014    Procedure: RADIOLOGY WITH ANESTHESIA;  Surgeon: [name], MD;  Location: [facility];  Service: Radiology;  Laterality: N/A;  . Tee without cardioversion N/A 06/07/2014    Procedure: TRANSESOPHAGEAL ECHOCARDIOGRAM (TEE);  Surgeon: [name], MD;  Location: [facility] ENDOSCOPY;  Service: Cardiovascular;  Laterality: N/A;  . Loop recorder implant  06/07/2014    MDT LINQ implanted by Dr [name] for cryptogenic stroke  . Loop recorder implant N/A 06/07/2014    Procedure: LOOP RECORDER IMPLANT;  Surgeon: [name], MD;  Location: [facility] CATH LAB;  Service: Cardiovascular;  Laterality: N/A;    There were no vitals taken for this visit.  Visit Diagnosis:  Pain in joint, shoulder region, left  Spastic hemiplegia affecting left nondominant side      Subjective Assessment - 09/23/14 1457    Currently in Pain? Yes   Pain Score 4    Pain  Orientation Left   Pain Descriptors / Indicators Aching   Pain Type Chronic pain   Pain Onset More than a month ago   Pain Frequency Intermittent   Aggravating Factors  arm hanging down   Pain Relieving Factors supporting arm   Multiple Pain Sites No       Neuro re-ed for gentle self ROM reach for floor and AA/ROM shoulder flexion with UE Ranger mod facillitation. Weightbearing through left elbow while seated on mat, min v.c. Gentle P/ROM to left hand and wrist, and elbow. Moderate edema present in LUE today.                    OT Short Term Goals - 09/22/14 1720    OT SHORT TERM GOAL #1   Title Pt and daughter will be I with HEP - 2/16/20126   Status Achieved   OT SHORT TERM GOAL #2   Title Pt will require a with back only for UE body bathing in shower   Status Not Met  mod a; daughter washes R side and back   OT SHORT TERM GOAL #3   Title Pt will be close s for LB  bathing in shower   Status Not Met  daughter washes bottom   OT SHORT TERM GOAL #4   Title  Pt will be min afor UB dressing with AE  prn   Status Achieved  pt reports she needs a little help with long sleeve shirts.   OT SHORT TERM GOAL #5   Title Pt will be min a for LB dressing with AE prn   Status Achieved   OT SHORT TERM GOAL #6   Title Pt will report no more than 4/10 pain in LUE with HEP   Status Achieved   OT SHORT TERM GOAL #7   Status Achieved           OT Long Term Goals - 09/22/14 1720    OT LONG TERM GOAL #1   Title Pt and daughter will be mod I with updated HEP prn - 10/04/2014   Status On-going   OT LONG TERM GOAL #2   Title Pt will be supervision with simple meal prep at ambulatory level   Status Achieved   OT LONG TERM GOAL #3   Title Pt will be mod I with toilet hygiene   Status Achieved   OT LONG TERM GOAL #4   Title Pt will be able to read 3 sentence paragraph with strategies with min vc's   Status On-going   OT LONG TERM GOAL #5   Title Pt and daughter will be  able to identify 2 strategies to compensate for memory deficit.   Status Achieved   OT LONG TERM GOAL #6   Title Pt will report no greater than 3/10 pain in LUE with HEP   Status On-going   OT LONG TERM GOAL #7   Title Pt will demonstrate ability to use LUE as gross stabilizer with basic self care tasks   Status Achieved               Plan - 09/25/2014 1720    Clinical Impression Statement Pt is progressing towards goals. Pt remains limited by LUE pain and limited functional use.   Rehab Potential Good   Clinical Impairments Affecting Rehab Potential pain, spasticity   OT Frequency 2x / week   OT Duration 8 weeks   Plan continue to work towards remaining long term goals, discussed plans to renew with patient   OT Home Exercise Plan self ROM hand   Consulted and Agree with Plan of Care Patient          G-Codes - 09-25-2014 1722    Functional Assessment Tool Used Pt is now dressing with min A and demonstrates ability to perform simple cooking with supervision.   Functional Limitation Self care   Self Care Current Status [phone]) At least 40 percent but less than 60 percent impaired, limited or restricted   Self Care Goal Status ([id number]) At least 20 percent but less than 40 percent impaired, limited or restricted      Problem List Patient Active Problem List   Diagnosis Date Noted  . Spastic hemiplegia affecting nondominant side 07/25/2014  . 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 Left hemiparesis 06/10/2014  . Left-sided neglect 06/10/2014  . 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 Morbid obesity- BMI 40 06/06/2014  . Acute respiratory failure with hypoxia 06/05/2014  . Cerebral infarction due to occlusion of right middle cerebral artery 06/03/2014  . Cerebral infarct 06/03/2014  . Altered mental status 06/03/2014  . GERD (gastroesophageal reflux disease) 04/12/2014  . Benign essential HTN 02/07/2014   . Dyslipidemia- statin intol 02/07/2014  . Chest pain- Low risk Myoview July 2015 02/06/2014    RINE,KATHRYN 25-Sep-2014, 5:30 PM Curt Bears  Rine, OTR/L [phone] Phone: [phone] 5:30 PM 03/04/2016[facility] [address] [place], [place], [address] Phone: [phone]   Fax:  [phone]

## 2014-09-27 ENCOUNTER — Encounter: Payer: Self-pay | Admitting: Physical Therapy

## 2014-09-27 ENCOUNTER — Ambulatory Visit: Payer: Medicare Other | Admitting: Physical Therapy

## 2014-09-27 ENCOUNTER — Ambulatory Visit: Payer: Medicare Other | Admitting: Occupational Therapy

## 2014-09-27 DIAGNOSIS — R4189 Other symptoms and signs involving cognitive functions and awareness: Secondary | ICD-10-CM

## 2014-09-27 DIAGNOSIS — Z7409 Other reduced mobility: Secondary | ICD-10-CM

## 2014-09-27 DIAGNOSIS — G8114 Spastic hemiplegia affecting left nondominant side: Secondary | ICD-10-CM

## 2014-09-27 DIAGNOSIS — R269 Unspecified abnormalities of gait and mobility: Secondary | ICD-10-CM

## 2014-09-27 DIAGNOSIS — H539 Unspecified visual disturbance: Secondary | ICD-10-CM | POA: Diagnosis not present

## 2014-09-27 DIAGNOSIS — M25512 Pain in left shoulder: Secondary | ICD-10-CM

## 2014-09-27 NOTE — Therapy (Signed)
[facility] [address] [place], [place], [address] Phone: [phone]   Fax:  [phone]  Physical Therapy Treatment  Patient Details  Name: Mary Le MRN: [id number] Date of Birth: Oct 21, 1945 Referring Provider:  [name], MD  Encounter Date: 09/27/2014      PT End of Session - 09/27/14 1327    Visit Number 10   Number of Visits 16   Date for PT Re-Evaluation 10/07/14   PT Start Time 6962   PT Stop Time 1400   PT Time Calculation (min) 45 min   Equipment Utilized During Treatment Gait belt   Activity Tolerance Patient tolerated treatment well   Behavior During Therapy [facility] for tasks assessed/performed      Past Medical History  Diagnosis Date  . Hypertension   . Hypercholesteremia   . Back pain     arthritis  . CVA (cerebral infarction)     Past Surgical History  Procedure Laterality Date  . Appendectomy    . Tonsillectomy    . Hemorroidectomy    . Tubal ligation    . Radiology with anesthesia N/A 06/03/2014    Procedure: RADIOLOGY WITH ANESTHESIA;  Surgeon: [name], MD;  Location: [facility];  Service: Radiology;  Laterality: N/A;  . Tee without cardioversion N/A 06/07/2014    Procedure: TRANSESOPHAGEAL ECHOCARDIOGRAM (TEE);  Surgeon: [name], MD;  Location: [facility] ENDOSCOPY;  Service: Cardiovascular;  Laterality: N/A;  . Loop recorder implant  06/07/2014    MDT LINQ implanted by Dr [name] for cryptogenic stroke  . Loop recorder implant N/A 06/07/2014    Procedure: LOOP RECORDER IMPLANT;  Surgeon: [name], MD;  Location: [facility] CATH LAB;  Service: Cardiovascular;  Laterality: N/A;    There were no vitals taken for this visit.  Visit Diagnosis:  Impaired functional mobility and activity tolerance  Abnormality of gait      Subjective Assessment - 09/27/14 1324    Symptoms  No new falls. Did have some left foot/leg swelling over weekend due to being busy and not  elevating her leg up. Better today as she has been elevating her leg for a couple of days.    Currently in Pain? Yes   Pain Score 2    Pain Location Toe (Comment which one)  all of them on left foot   Pain Orientation Left   Pain Descriptors / Indicators Aching;Sore   Pain Type Acute pain   Pain Onset In the past 7 days   Pain Frequency Intermittent   Aggravating Factors  dependent positioning creating increased swelling   Pain Relieving Factors elevating foot and decreasing swelling           [facility] Adult PT Treatment/Exercise - 09/27/14 1334    Ambulation/Gait   Ambulation/Gait Yes   Ambulation/Gait Assistance 4: Min guard   Ambulation/Gait Assistance Details cues to look up and for increased left foot clearance/heel strike with gait                    Ambulation Distance (Feet) 350 Feet   Assistive device None   Gait Pattern Step-through pattern;Decreased stride length;Poor foot clearance - left;Trunk flexed   Ambulation Surface Level;Indoor   Gait velocity 17.12 sec= 1.92  ft/sec with no AD   Berg Balance Test   Sit to Stand Able to stand without using hands and stabilize independently   Standing Unsupported Able to stand 2 minutes with supervision   Sitting with Back  Unsupported but Feet Supported on Floor or Stool Able to sit safely and securely 2 minutes   Stand to Sit Sits safely with minimal use of hands   Transfers Able to transfer safely, minor use of hands   Standing Unsupported with Eyes Closed Able to stand 10 seconds with supervision   Standing Ubsupported with Feet Together Able to place feet together independently and stand 1 minute safely   From Standing, Reach Forward with Outstretched Arm Can reach forward >12 cm safely (5")  7 inches   From Standing Position, Pick up Object from Floor Able to pick up shoe safely and easily   From Standing Position, Turn to Look Behind Over each Shoulder Looks behind one side only/other side shows less weight shift  left >  right   Turn 360 Degrees Able to turn 360 degrees safely but slowly  right 6.60, left 6.97   Standing Unsupported, Alternately Place Feet on Step/Stool Able to stand independently and complete 8 steps >20 seconds  10.78 sec, min guard assist with last 2, no lob   Standing Unsupported, One Foot in Front Able to plae foot ahead of the other independently and hold 30 seconds   Standing on One Leg Able to lift leg independently and hold 5-10 seconds  right leg stance =5, left stance <3 seconds   Total Score 47     Neuro Re-ed: Single leg stance activities with foam bubbles - toe taps to 5 bubbles in semi circle form x 2 laps each with up to mod assist for balance and cues on weight shift and stance stability.       PT Short Term Goals - 08/10/14 6962    PT SHORT TERM GOAL #1   Title The patient will return demo HEP indep with written cues.  Target date 09/09/2014   Time 4   Period Weeks   PT SHORT TERM GOAL #2   Title The patient will improve Berg score from 27/56 up to 35/56 to demo decreasing risk for falls.  Target date 09/09/2014   Time 4   Period Weeks   PT SHORT TERM GOAL #3   Title The patient will improve gait speed from 1.44 f t/sec up to 1.8 ft/sec to demo decreasing risk for falls.  Target date 09/09/2014   Time 4   Period Weeks   PT SHORT TERM GOAL #4   Title The patient will ambulate household surfaces without a device independently x 400 ft for improved independence in the home.  Target date 09/09/2014   Time 4   Period Weeks   PT SHORT TERM GOAL #5   Title The patient will move sit<>supine to the right and left sides independently.  Target date 09/09/2014   Time 4   Period Weeks           PT Long Term Goals - 08/10/14 0839    PT LONG TERM GOAL #1   Title The patient will verbalize understanding of CVA risk factors/warning signs.  TArget date 10/08/2014   Time 8   Period Weeks   PT LONG TERM GOAL #2   Title The patient will improve Berg score from 27/56 up to  40/56 to demo decreased risk for falls.  Target date 10/08/2014   Time 8   Period Weeks   PT LONG TERM GOAL #3   Title The patient will improve SIS: mobility from 33% up to 45%.  Target date 10/08/2014   Time 8   Period [place]  PT LONG TERM GOAL #4   Title The patient will improve gait speed from 1.44 ft/sec up to 2.3 ft/sec to demo improving functional ambulation. Target date 10/08/2014   Time 8   Period Weeks   PT LONG TERM GOAL #5   Title The patient will negotiate 4 steps modified indep with reciprocal pattern and one HR.   Target date 10/08/2014   Time 8   Period Weeks   Additional Long Term Goals   Additional Long Term Goals Yes   PT LONG TERM GOAL #6   Title The patient will negotiate level community surfaces without a device x 5 minutes independently for improved community mobility.  Target date 10/08/2014   Time 8   Period Weeks           Plan - 10/22/2014 1327    Clinical Impression Statement Pt making steady progress toward goals. Improved with gait speed today and slight improvement on Berg Balance Test.   Pt will benefit from skilled therapeutic intervention in order to improve on the following deficits Decreased balance;Difficulty walking;Postural dysfunction;Decreased strength;Decreased endurance;Decreased activity tolerance;Decreased mobility;Pain;Abnormal gait   Rehab Potential Good   PT Frequency 2x / week   PT Duration 8 weeks   PT Treatment/Interventions Gait training;Therapeutic exercise;Patient/family education;Balance training;Stair training;Neuromuscular re-education;Therapeutic activities   PT Next Visit Plan L dorsiflexion with resistance band, gait working on heel strike on L, balance activites   Consulted and Agree with Plan of Care Patient;Family member/caregiver   Family Member Consulted daughter          G-Codes - 10-22-14 1414    Functional Assessment Tool Used Berg=47/56, Gait speed=1.92 ft/sec   Functional Limitation Mobility: Walking and moving  around   Mobility: Walking and Moving Around Current Status [phone]) At least 20 percent but less than 40 percent impaired, limited or restricted   Mobility: Walking and Moving Around Goal Status [phone]) At least 20 percent but less than 40 percent impaired, limited or restricted      Problem List Patient Active Problem List   Diagnosis Date Noted  . Spastic hemiplegia affecting nondominant side 07/25/2014  . 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 Left hemiparesis 06/10/2014  . Left-sided neglect 06/10/2014  . 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 Morbid obesity- BMI 40 06/06/2014  . Acute respiratory failure with hypoxia 06/05/2014  . Cerebral infarction due to occlusion of right middle cerebral artery 06/03/2014  . Cerebral infarct 06/03/2014  . Altered mental status 06/03/2014  . GERD (gastroesophageal reflux disease) 04/12/2014  . Benign essential HTN 02/07/2014  . Dyslipidemia- statin intol 02/07/2014  . Chest pain- Low risk Myoview July 2015 02/06/2014    Willow Ora 2014/10/22, 4:32 PM  Willow Ora, PTA, [facility] [address] [place], [place] [address] [phone] 10/22/14, 4:32 PM

## 2014-09-27 NOTE — Therapy (Signed)
[facility] [address] [place], [place], [address] Phone: [phone]   Fax:  [phone]  Physical Therapy Treatment  Patient Details  Name: Mary Le MRN: [id number] Date of Birth: Jun 19, 1946 Referring Provider:  [name], MD  Encounter Date: 09/27/2014      PT End of Session - 09/27/14 1327    Visit Number 10   Number of Visits 16   Date for PT Re-Evaluation 10/07/14   PT Start Time 1315   Equipment Utilized During Treatment Gait belt   Activity Tolerance Patient tolerated treatment well   Behavior During Therapy [facility] for tasks assessed/performed      Past Medical History  Diagnosis Date  . Hypertension   . Hypercholesteremia   . Back pain     arthritis  . CVA (cerebral infarction)     Past Surgical History  Procedure Laterality Date  . Appendectomy    . Tonsillectomy    . Hemorroidectomy    . Tubal ligation    . Radiology with anesthesia N/A 06/03/2014    Procedure: RADIOLOGY WITH ANESTHESIA;  Surgeon: [name], MD;  Location: [facility];  Service: Radiology;  Laterality: N/A;  . Tee without cardioversion N/A 06/07/2014    Procedure: TRANSESOPHAGEAL ECHOCARDIOGRAM (TEE);  Surgeon: [name], MD;  Location: [facility] ENDOSCOPY;  Service: Cardiovascular;  Laterality: N/A;  . Loop recorder implant  06/07/2014    MDT LINQ implanted by Dr [name] for cryptogenic stroke  . Loop recorder implant N/A 06/07/2014    Procedure: LOOP RECORDER IMPLANT;  Surgeon: [name], MD;  Location: [facility] CATH LAB;  Service: Cardiovascular;  Laterality: N/A;    There were no vitals taken for this visit.  Visit Diagnosis:  No diagnosis found.      Subjective Assessment - 09/27/14 1324    Symptoms  No new falls. Did have some left foot/leg swelling over weekend due to being busy and not elevating her leg up. Better today as she has been elevating her leg for a couple of days.    Currently in Pain? Yes   Pain Score 2    Pain Location Toe (Comment which one)  all of them on left foot   Pain Orientation Left   Pain Descriptors / Indicators Aching;Sore   Pain Type Acute pain   Pain Onset In the past 7 days   Pain Frequency Intermittent   Aggravating Factors  dependent positioning creating increased swelling   Pain Relieving Factors elevating foot and decreasing swelling                    [facility] Adult PT Treatment/Exercise - 09/27/14 1334    Ambulation/Gait   Ambulation/Gait Yes   Ambulation/Gait Assistance 4: Min guard   Ambulation/Gait Assistance Details cues to look up and for increased left foot clearance/heel strike with gait                    Ambulation Distance (Feet) 350 Feet   Assistive device None   Gait Pattern Step-through pattern;Decreased stride length;Poor foot clearance - left;Trunk flexed   Ambulation Surface Level;Indoor   Gait velocity 17.12 sec= 1.92  ft/sec with no AD   Berg Balance Test   Sit to Stand Able to stand without using hands and stabilize independently   Standing Unsupported Able to stand 2 minutes with supervision   Sitting with Back Unsupported but Feet Supported on Floor or Stool Able to sit safely and  securely 2 minutes   Stand to Sit Sits safely with minimal use of hands   Transfers Able to transfer safely, minor use of hands   Standing Unsupported with Eyes Closed Able to stand 10 seconds with supervision   Standing Ubsupported with Feet Together Able to place feet together independently and stand 1 minute safely   From Standing, Reach Forward with Outstretched Arm Can reach forward >12 cm safely (5")  7 inches   From Standing Position, Pick up Object from Floor Able to pick up shoe safely and easily   From Standing Position, Turn to Look Behind Over each Shoulder Looks behind one side only/other side shows less weight shift  left > right   Turn 360 Degrees Able to turn 360 degrees safely but slowly  right 6.60, left 6.97   Standing  Unsupported, Alternately Place Feet on Step/Stool Able to stand independently and complete 8 steps >20 seconds  10.78 sec, min guard assist with last 2, no lob   Standing Unsupported, One Foot in Front Able to plae foot ahead of the other independently and hold 30 seconds   Standing on One Leg Able to lift leg independently and hold 5-10 seconds  right leg stance =5, left stance <3 seconds   Total Score 47                  PT Short Term Goals - 08/10/14 0350    PT SHORT TERM GOAL #1   Title The patient will return demo HEP indep with written cues.  Target date 09/09/2014   Time 4   Period Weeks   PT SHORT TERM GOAL #2   Title The patient will improve Berg score from 27/56 up to 35/56 to demo decreasing risk for falls.  Target date 09/09/2014   Time 4   Period Weeks   PT SHORT TERM GOAL #3   Title The patient will improve gait speed from 1.44 f t/sec up to 1.8 ft/sec to demo decreasing risk for falls.  Target date 09/09/2014   Time 4   Period Weeks   PT SHORT TERM GOAL #4   Title The patient will ambulate household surfaces without a device independently x 400 ft for improved independence in the home.  Target date 09/09/2014   Time 4   Period Weeks   PT SHORT TERM GOAL #5   Title The patient will move sit<>supine to the right and left sides independently.  Target date 09/09/2014   Time 4   Period Weeks           PT Long Term Goals - 08/10/14 0839    PT LONG TERM GOAL #1   Title The patient will verbalize understanding of CVA risk factors/warning signs.  TArget date 10/08/2014   Time 8   Period Weeks   PT LONG TERM GOAL #2   Title The patient will improve Berg score from 27/56 up to 40/56 to demo decreased risk for falls.  Target date 10/08/2014   Time 8   Period Weeks   PT LONG TERM GOAL #3   Title The patient will improve SIS: mobility from 33% up to 45%.  Target date 10/08/2014   Time 8   Period Weeks   PT LONG TERM GOAL #4   Title The patient will improve gait  speed from 1.44 ft/sec up to 2.3 ft/sec to demo improving functional ambulation. Target date 10/08/2014   Time 8   Period Weeks   PT LONG TERM  GOAL #5   Title The patient will negotiate 4 steps modified indep with reciprocal pattern and one HR.   Target date 10/08/2014   Time 8   Period Weeks   Additional Long Term Goals   Additional Long Term Goals Yes   PT LONG TERM GOAL #6   Title The patient will negotiate level community surfaces without a device x 5 minutes independently for improved community mobility.  Target date 10/08/2014   Time 8   Period Weeks               Plan - 15-Oct-2014 1327    Pt will benefit from skilled therapeutic intervention in order to improve on the following deficits Decreased balance;Difficulty walking;Postural dysfunction;Decreased strength;Decreased endurance;Decreased activity tolerance;Decreased mobility;Pain;Abnormal gait   Rehab Potential Good   PT Frequency 2x / week   PT Duration 8 weeks   PT Treatment/Interventions Gait training;Therapeutic exercise;Patient/family education;Balance training;Stair training;Neuromuscular re-education;Therapeutic activities   PT Next Visit Plan L dorsiflexion with resistance band, gait working on heel strike on L, balance activites   Consulted and Agree with Plan of Care Patient;Family member/caregiver   Family Member Consulted daughter          G-Codes - October 15, 2014 1414    Functional Assessment Tool Used Berg=28/56, Gait speed=1.92 ft/sec   Functional Limitation Mobility: Walking and moving around   Mobility: Walking and Moving Around Current Status [phone]) At least 40 percent but less than 60 percent impaired, limited or restricted   Mobility: Walking and Moving Around Goal Status [phone]) At least 20 percent but less than 40 percent impaired, limited or restricted      Problem List Patient Active Problem List   Diagnosis Date Noted  . Spastic hemiplegia affecting nondominant side 07/25/2014  . Spondylosis  of lumbar region without myelopathy or radiculopathy 06/20/2014  . Left hemiparesis 06/10/2014  . Left-sided neglect 06/10/2014  . 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 Morbid obesity- BMI 40 06/06/2014  . Acute respiratory failure with hypoxia 06/05/2014  . Cerebral infarction due to occlusion of right middle cerebral artery 06/03/2014  . Cerebral infarct 06/03/2014  . Altered mental status 06/03/2014  . GERD (gastroesophageal reflux disease) 04/12/2014  . Benign essential HTN 02/07/2014  . Dyslipidemia- statin intol 02/07/2014  . Chest pain- Low risk Myoview July 2015 02/06/2014    Muskaan Smet October 15, 2014, 2:43 PM  [facility] [address] [place], [place], [address] Phone: [phone]   Fax:  [phone]

## 2014-09-28 NOTE — Therapy (Signed)
[facility] [address] [place], [place], [address] Phone: [phone]   Fax:  [phone]  Occupational Therapy Treatment  Patient Details  Name: Mary Le MRN: [id number] Date of Birth: 02/16/1946 Referring Provider:  [name], MD  Encounter Date: 09/27/2014      OT End of Session - 09/27/14 1447    Visit Number 11   Number of Visits 16   Date for OT Re-Evaluation 10/04/14   Authorization Type medicare - needs G code!   Authorization - Visit Number 11   Authorization - Number of Visits 20   OT Start Time 4580   OT Stop Time 1440   OT Time Calculation (min) 35 min      Past Medical History  Diagnosis Date  . Hypertension   . Hypercholesteremia   . Back pain     arthritis  . CVA (cerebral infarction)     Past Surgical History  Procedure Laterality Date  . Appendectomy    . Tonsillectomy    . Hemorroidectomy    . Tubal ligation    . Radiology with anesthesia N/A 06/03/2014    Procedure: RADIOLOGY WITH ANESTHESIA;  Surgeon: [name], MD;  Location: [facility];  Service: Radiology;  Laterality: N/A;  . Tee without cardioversion N/A 06/07/2014    Procedure: TRANSESOPHAGEAL ECHOCARDIOGRAM (TEE);  Surgeon: [name], MD;  Location: [facility] ENDOSCOPY;  Service: Cardiovascular;  Laterality: N/A;  . Loop recorder implant  06/07/2014    MDT LINQ implanted by Dr [name] for cryptogenic stroke  . Loop recorder implant N/A 06/07/2014    Procedure: LOOP RECORDER IMPLANT;  Surgeon: [name], MD;  Location: [facility] CATH LAB;  Service: Cardiovascular;  Laterality: N/A;    There were no vitals taken for this visit.  Visit Diagnosis:  No diagnosis found.      Subjective Assessment - 09/27/14 1406    Currently in Pain? Yes   Pain Score 3    Pain Location Toe (Comment which one)   Pain Orientation Left   Pain Descriptors / Indicators Aching;Sore   Pain Type Acute pain   Aggravating Factors   swelling   Pain Relieving Factors elevation   Pain Score 3   Pain Type Chronic pain   Pain Location Arm   Pain Descriptors / Indicators Aching   Pain Frequency Intermittent      Treatment: Gentle passive stretch to left wrist, fingers and elbow in extension. Weightbearing activities through left elbow and hand with min-mod facillitation.Neuro re-ed for AA/ROM shoulder flexion with UE ranger with mirror for visual feed back and mod facillitation. Therapy was ended a little early as pt took a laxative and needed to leave.                     OT Short Term Goals - 09/22/14 1720    OT SHORT TERM GOAL #1   Title Pt and daughter will be I with HEP - 2/16/20126   Status Achieved   OT SHORT TERM GOAL #2   Title Pt will require a with back only for UE body bathing in shower   Status Not Met  mod a; daughter washes R side and back   OT SHORT TERM GOAL #3   Title Pt will be close s for LB  bathing in shower   Status Not Met  daughter washes bottom   OT SHORT TERM GOAL #4   Title  Pt will be min  afor UB dressing with AE prn   Status Achieved  pt reports she needs a little help with long sleeve shirts.   OT SHORT TERM GOAL #5   Title Pt will be min a for LB dressing with AE prn   Status Achieved   OT SHORT TERM GOAL #6   Title Pt will report no more than 4/10 pain in LUE with HEP   Status Achieved   OT SHORT TERM GOAL #7   Status Achieved           OT Long Term Goals - 09/22/14 1720    OT LONG TERM GOAL #1   Title Pt and daughter will be mod I with updated HEP prn - 10/04/2014   Status On-going   OT LONG TERM GOAL #2   Title Pt will be supervision with simple meal prep at ambulatory level   Status Achieved   OT LONG TERM GOAL #3   Title Pt will be mod I with toilet hygiene   Status Achieved   OT LONG TERM GOAL #4   Title Pt will be able to read 3 sentence paragraph with strategies with min vc's   Status On-going   OT LONG TERM GOAL #5   Title Pt and daughter  will be able to identify 2 strategies to compensate for memory deficit.   Status Achieved   OT LONG TERM GOAL #6   Title Pt will report no greater than 3/10 pain in LUE with HEP   Status On-going   OT LONG TERM GOAL #7   Title Pt will demonstrate ability to use LUE as gross stabilizer with basic self care tasks   Status Achieved               Problem List Patient Active Problem List   Diagnosis Date Noted  . Spastic hemiplegia affecting nondominant side 07/25/2014  . 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 Left hemiparesis 06/10/2014  . Left-sided neglect 06/10/2014  . 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 Morbid obesity- BMI 40 06/06/2014  . Acute respiratory failure with hypoxia 06/05/2014  . Cerebral infarction due to occlusion of right middle cerebral artery 06/03/2014  . Cerebral infarct 06/03/2014  . Altered mental status 06/03/2014  . GERD (gastroesophageal reflux disease) 04/12/2014  . Benign essential HTN 02/07/2014  . Dyslipidemia- statin intol 02/07/2014  . Chest pain- Low risk Myoview July 2015 02/06/2014    RINE,KATHRYN 09/28/2014, 8:48 AM Theone Murdoch, OTR/L [phone] Phone: [phone] 8:49 AM 09/28/2014 [facility] [address] [place], [place], [address] Phone: [phone]   Fax:  [phone]

## 2014-09-29 ENCOUNTER — Ambulatory Visit: Payer: Medicare Other | Admitting: Physical Therapy

## 2014-09-29 ENCOUNTER — Ambulatory Visit: Payer: Medicare Other | Admitting: Occupational Therapy

## 2014-10-03 ENCOUNTER — Ambulatory Visit: Payer: Medicare Other | Admitting: Physical Medicine & Rehabilitation

## 2014-10-03 ENCOUNTER — Telehealth: Payer: Self-pay | Admitting: *Deleted

## 2014-10-03 NOTE — Telephone Encounter (Signed)
[name] called this morning for Mary Le because she was unable to make the appointment due to their car woul dnot start.  She is requesting refill on her ultram. It looks like [name] ordered it at discharge and Dr Leonie Man ordered last time.  Do you want to prescribe this for her? Please advise.

## 2014-10-03 NOTE — Telephone Encounter (Signed)
May call in Ultracet 1 by mouth twice a day when necessary #60 with 2 refills

## 2014-10-04 ENCOUNTER — Ambulatory Visit: Payer: Medicare Other | Admitting: Rehabilitative and Restorative Service Providers"

## 2014-10-04 ENCOUNTER — Ambulatory Visit: Payer: Medicare Other | Admitting: Occupational Therapy

## 2014-10-04 DIAGNOSIS — R4189 Other symptoms and signs involving cognitive functions and awareness: Secondary | ICD-10-CM | POA: Diagnosis not present

## 2014-10-04 DIAGNOSIS — M25512 Pain in left shoulder: Secondary | ICD-10-CM | POA: Diagnosis not present

## 2014-10-04 DIAGNOSIS — G8114 Spastic hemiplegia affecting left nondominant side: Secondary | ICD-10-CM | POA: Diagnosis not present

## 2014-10-04 DIAGNOSIS — H539 Unspecified visual disturbance: Secondary | ICD-10-CM

## 2014-10-04 DIAGNOSIS — R269 Unspecified abnormalities of gait and mobility: Secondary | ICD-10-CM | POA: Diagnosis not present

## 2014-10-04 DIAGNOSIS — Z7409 Other reduced mobility: Secondary | ICD-10-CM | POA: Diagnosis not present

## 2014-10-04 MED ORDER — TRAMADOL-ACETAMINOPHEN 37.5-325 MG PO TABS: 1.0000 | ORAL_TABLET | Freq: Two times a day (BID) | ORAL | Status: DC | PRN

## 2014-10-04 NOTE — Telephone Encounter (Signed)
Notified [name] and Called to [facility] order pharmacy at pt request.

## 2014-10-04 NOTE — Therapy (Signed)
[facility] [address] [place], [place], [address] Phone: [phone]   Fax:  [phone]  Occupational Therapy Treatment  Patient Details  Name: Mary Le MRN: [id number] Date of Birth: 05/20/1946 Referring Provider:  [name], MD  Encounter Date: 10/04/2014      OT End of Session - 10/04/14 1338    Visit Number 12   Number of Visits 16   Date for OT Re-Evaluation 10/04/14   Authorization Type medicare - needs G code!   Authorization - Visit Number 12   Authorization - Number of Visits 20   OT Start Time 7616   OT Stop Time 1231   OT Time Calculation (min) 38 min   Activity Tolerance Patient tolerated treatment well   Behavior During Therapy WFL for tasks assessed/performed      Past Medical History  Diagnosis Date  . Hypertension   . Hypercholesteremia   . Back pain     arthritis  . CVA (cerebral infarction)     Past Surgical History  Procedure Laterality Date  . Appendectomy    . Tonsillectomy    . Hemorroidectomy    . Tubal ligation    . Radiology with anesthesia N/A 06/03/2014    Procedure: RADIOLOGY WITH ANESTHESIA;  Surgeon: [name], MD;  Location: [facility];  Service: Radiology;  Laterality: N/A;  . Tee without cardioversion N/A 06/07/2014    Procedure: TRANSESOPHAGEAL ECHOCARDIOGRAM (TEE);  Surgeon: [name], MD;  Location: [facility] ENDOSCOPY;  Service: Cardiovascular;  Laterality: N/A;  . Loop recorder implant  06/07/2014    MDT LINQ implanted by Dr [name] for cryptogenic stroke  . Loop recorder implant N/A 06/07/2014    Procedure: LOOP RECORDER IMPLANT;  Surgeon: [name], MD;  Location: [facility] CATH LAB;  Service: Cardiovascular;  Laterality: N/A;    There were no vitals filed for this visit.  Visit Diagnosis:  Spastic hemiplegia affecting left nondominant side - Plan: Ot plan of care cert/re-cert  Pain in joint, shoulder region, left - Plan: Ot plan of care  cert/re-cert  Impaired cognition - Plan: Ot plan of care cert/re-cert  Impaired visual perception - Plan: Ot plan of care cert/re-cert  Impaired functional mobility and activity tolerance - Plan: Ot plan of care cert/re-cert      Subjective Assessment - 10/04/14 1335    Symptoms Pt reports today is her birthday and she palans to do some baking   Pertinent History see epic snapshot;  HTN, fluctuating BP since stroke   Patient Stated Goals to use LUE   Currently in Pain? Yes   Pain Score 6    Pain Location Arm   Pain Orientation Left   Pain Descriptors / Indicators Aching   Pain Type Acute pain   Pain Onset More than a month ago   Pain Frequency Intermittent   Aggravating Factors  exercise   Pain Relieving Factors repositioning      Treatment: Pt and daughter brought in resting hand splint. Therapist adjusted for improved fit and positioning, with new padding added and thumb portion modified. Pt reports improved comfort with adjustments. Therapist issued a new smaller edema glove for pt as swelling has gone down.  Gentle P/ROM wrist ulnar deviation, supination / pronation and finger extension. Pt performed self ROM table slides, supination / pronation and shoulder flexion / elbow extension reach for the floor. Therapist checked progress towards goals.  OT Short Term Goals - 10/04/14 1227    OT SHORT TERM GOAL #1   Title Pt and daughter will be I with HEP - 2/16/20126   Status Achieved   OT SHORT TERM GOAL #2   Title Pt will require a with back only for UE body bathing in shower   Status Achieved  Pt only requires assist with back   OT SHORT TERM GOAL #3   Title Pt will be close s for LB  bathing in shower   Status Achieved  Pt is supervision   OT SHORT TERM GOAL #4   Title  Pt will be min afor UB dressing with AE prn   Status Achieved  pt reports she needs a little help with long sleeve shirts.   OT SHORT TERM GOAL #5   Title Pt  will be min a for LB dressing with AE prn   Status Achieved   Additional Short Term Goals   Additional Short Term Goals Yes   OT SHORT TERM GOAL #6   Title Pt will report no more than 4/10 pain in LUE with HEP   Baseline 4-6/10   Status Partially Met   OT SHORT TERM GOAL #7   Title Pt will attend to L visual field during functional tasks with min vc's   Status Achieved   OT SHORT TERM GOAL #8   Title I with updated HEP.   Baseline ck 11/04/14   Time 4   Period Weeks   Status New           OT Long Term Goals - 10/04/14 1204    OT LONG TERM GOAL #1   Title Pt and daughter will be mod I with updated HEP prn - 10/04/2014   Status Achieved   OT LONG TERM GOAL #2   Title Pt will be supervision with simple meal prep at ambulatory level   Status Achieved   OT LONG TERM GOAL #3   Title Pt will be mod I with toilet hygiene   Status Achieved   OT LONG TERM GOAL #4   Title Pt will be able to read 3 sentence paragraph with strategies with min vc's   Status Achieved   OT LONG TERM GOAL #5   Title Pt and daughter will be able to identify 2 strategies to compensate for memory deficit.   Status Achieved   Long Term Additional Goals   Additional Long Term Goals Yes   OT LONG TERM GOAL #6   Title Pt will report no greater than 3/10 pain in LUE with HEP   Baseline pain 4-6/10 at times   Status Not Met   OT LONG TERM GOAL #7   Title Pt will demonstrate ability to use LUE as gross stabilizer with basic self care tasks   Status Achieved   OT LONG TERM GOAL #8   Title Pt will demonstrate 45 * shoulder flexion/ scaption in prep for functional reach   Baseline 10/04/14- demonstrates grossly 35-40*   Time 8   Period Weeks   Status New   OT LONG TERM GOAL  #9   Baseline Pt will report that she is using LUE as a gross assist/ stabilizer at least 25 % of the time for ADLS   Time 8   Period Weeks   Status New               Plan - 10/04/14 1339    Clinical Impression Statement Pt  is progressing  towards goals and agrees with plans to renew.   Pt will benefit from skilled therapeutic intervention in order to improve on the following deficits (Retired) Decreased activity tolerance;Decreased balance;Decreased cognition;Decreased coordination;Decreased range of motion;Decreased safety awareness;Decreased mobility;Decreased knowledge of use of DME;Decreased strength;Increased edema;Impaired UE functional use;Impaired tone;Pain;Impaired sensation;Impaired perceived functional ability;Improper body mechanics;Obesity   Rehab Potential Good   Clinical Impairments Affecting Rehab Potential pain, spasticity   OT Frequency 2x / week   OT Duration 8 weeks  may d/c after 4 weeks depending on progress   OT Treatment/Interventions Self-care/ADL training;Ultrasound;Moist Heat;Fluidtherapy;Therapeutic exercise;Neuromuscular education;Energy conservation;Manual Therapy;Functional Mobility Training;DME and/or AE instruction;Passive range of motion;Visual/perceptual remediation/compensation;Cognitive remediation/compensation;Therapeutic activities;Splinting;Patient/family education;Balance training   Plan neuro re-ed   OT Home Exercise Plan self ROM hand   Consulted and Agree with Plan of Care Patient;Family member/caregiver   Family Member Consulted daughter        Problem List Patient Active Problem List   Diagnosis Date Noted  . Spastic hemiplegia affecting nondominant side 07/25/2014  . 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 Left hemiparesis 06/10/2014  . Left-sided neglect 06/10/2014  . 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 Morbid obesity- BMI 40 06/06/2014  . Acute respiratory failure with hypoxia 06/05/2014  . Cerebral infarction due to occlusion of right middle cerebral artery 06/03/2014  . Cerebral infarct 06/03/2014  . Altered mental status 06/03/2014  . GERD (gastroesophageal reflux disease)  04/12/2014  . Benign essential HTN 02/07/2014  . Dyslipidemia- statin intol 02/07/2014  . Chest pain- Low risk Myoview July 2015 02/06/2014    RINE,KATHRYN 10/04/2014, 4:21 PM Theone Murdoch, OTR/L [phone] Phone: [phone] 4:21 PM 10/04/2014 [facility] [address] [place], [place], [address] Phone: [phone]   Fax:  [phone]

## 2014-10-04 NOTE — Patient Instructions (Signed)
Towel Curl   Sitting, crimp a towel up with toes of left foot. Relax foot by spreading towel out again.  Repeat for 3-5 minutes. Do _1-2___ sessions per day.  http://gt2.exer.us/412   Copyright  VHI. All rights reserved.

## 2014-10-04 NOTE — Therapy (Signed)
[facility] [address] [place], [place], [address] Phone: [phone]   Fax:  [phone]  Physical Therapy Treatment  Patient Details  Name: Mary Le MRN: [id number] Date of Birth: 12-12-1945 Referring Provider:  [name], MD  Encounter Date: 10/04/2014      PT End of Session - 10/04/14 1433    Visit Number 11  G code 1   Number of Visits 24   Date for PT Re-Evaluation 11/08/14   PT Start Time 1232   PT Stop Time 1318   PT Time Calculation (min) 46 min   Equipment Utilized During Treatment Gait belt   Activity Tolerance Patient tolerated treatment well   Behavior During Therapy [facility] for tasks assessed/performed      Past Medical History  Diagnosis Date  . Hypertension   . Hypercholesteremia   . Back pain     arthritis  . CVA (cerebral infarction)     Past Surgical History  Procedure Laterality Date  . Appendectomy    . Tonsillectomy    . Hemorroidectomy    . Tubal ligation    . Radiology with anesthesia N/A 06/03/2014    Procedure: RADIOLOGY WITH ANESTHESIA;  Surgeon: [name], MD;  Location: [facility];  Service: Radiology;  Laterality: N/A;  . Tee without cardioversion N/A 06/07/2014    Procedure: TRANSESOPHAGEAL ECHOCARDIOGRAM (TEE);  Surgeon: [name], MD;  Location: [facility] ENDOSCOPY;  Service: Cardiovascular;  Laterality: N/A;  . Loop recorder implant  06/07/2014    MDT LINQ implanted by Dr [name] for cryptogenic stroke  . Loop recorder implant N/A 06/07/2014    Procedure: LOOP RECORDER IMPLANT;  Surgeon: [name], MD;  Location: [facility] CATH LAB;  Service: Cardiovascular;  Laterality: N/A;    There were no vitals filed for this visit.  Visit Diagnosis:  Abnormality of gait - Plan: PT plan of care cert/re-cert  Impaired functional mobility and activity tolerance - Plan: PT plan of care cert/re-cert      Subjective Assessment - 10/04/14 1234    Symptoms "I'm trying  to move more", depending on what's going on at home. Patient reports swelling in her left foot continues.   Currently in Pain? Yes   Pain Score 8   "my back is always sore",    Pain Location Back   Pain Orientation Right;Left;Lower   Pain Descriptors / Indicators Aching   Pain Onset More than a month ago   Pain Frequency Intermittent   Aggravating Factors  worse with activity, "it's arthritis" and it has hurt for 20+ years      Gait: Ambulation without device with gait speed=1.07 ft/sec today.  Gait x 230 ft nonstop without device with patient fatiguing and then repeated (after rest) x 230 ft with SPC. Stairs with one handrail and step to pattern  THERAPEUTIC EXERCISE: Seated L ankle dorsiflexion, foot towel scrunch for intrinsic foot movement Sit<>stand exercise Standing heel cord stretch Verbally discussed prior HEP and reprinted this for patient and daughter  NEUROMUSCULAR RE-EDUCATION: Berg=39/56 today (varies from 3/8 performance)       [facility] PT Assessment - 10/04/14 1249    Standardized Balance Assessment   Standardized Balance Assessment Berg Balance Test   Berg Balance Test   Sit to Stand Able to stand  independently using hands   Standing Unsupported Able to stand safely 2 minutes   Sitting with Back Unsupported but Feet Supported on Floor or Stool Able to sit safely and securely  2 minutes   Stand to Sit Controls descent by using hands   Transfers Able to transfer safely, definite need of hands   Standing Unsupported with Eyes Closed Able to stand 10 seconds with supervision   Standing Ubsupported with Feet Together Able to place feet together independently and stand 1 minute safely   From Standing, Reach Forward with Outstretched Arm Can reach forward >12 cm safely (5")   From Standing Position, Pick up Object from Floor Able to pick up shoe, needs supervision   From Standing Position, Turn to Look Behind Over each Shoulder Turn sideways only but maintains balance    Turn 360 Degrees Able to turn 360 degrees safely but slowly   Standing Unsupported, Alternately Place Feet on Step/Stool Able to complete 4 steps without aid or supervision   Standing Unsupported, One Foot in [place] to take small step independently and hold 30 seconds   Standing on One Leg Tries to lift leg/unable to hold 3 seconds but remains standing independently   Total Score 39            PT Education - 10/04/14 1318    Education provided Yes   Education Details HEP for towel scrunch, reviewed goals and plan for Pt   Person(s) Educated Patient;Child(ren)   Methods Explanation;Demonstration;Handout   Comprehension Verbalized understanding;Returned demonstration          PT Short Term Goals - 10/04/14 1258    PT SHORT TERM GOAL #1   Title The patient will return demo HEP indep with written cues.  Target date 09/09/2014   Baseline Pt is walking, but not doing regular leg exercises.   Time 4   Period Weeks   Status On-going   PT SHORT TERM GOAL #2   Title The patient will improve Berg score from 27/56 up to 35/56 to demo decreasing risk for falls.  Target date 09/09/2014   Baseline Goal met on 10/04/2014 with patient improving to 39/56.   Time 4   Period Weeks   Status Achieved   PT SHORT TERM GOAL #3   Title The patient will improve gait speed from 1.44 f t/sec up to 1.8 ft/sec to demo decreasing risk for falls.  Target date 09/09/2014   Baseline No device=1.1 ft/sec and with SPC=   Time 4   Period Weeks   Status On-going   PT SHORT TERM GOAL #4   Title The patient will ambulate household surfaces without a device independently x 400 ft for improved independence in the home.  Target date 09/09/2014   Baseline Pt fatigues after 230 ft of gait.   Time 4   Period Weeks   Status On-going   PT SHORT TERM GOAL #5   Title The patient will move sit<>supine to the right and left sides independently.  Target date 09/09/2014   Baseline Per subjective reports from daughter that  patient independent with bed mobility.   Time 4   Period Weeks   Status Achieved           PT Long Term Goals - 10/04/14 1300    PT LONG TERM GOAL #1   Title The patient will verbalize understanding of CVA risk factors/warning signs.  TArget date 10/08/2014   Baseline Reviewed on 10/04/2014   Time 8   Period Weeks   Status Achieved   PT LONG TERM GOAL #2   Title The patient will improve Berg score from 27/56 up to 40/56 to demo decreased risk for falls.  Modified target date 11/08/2014   Baseline Berg=39/56 on 10/04/2014   Time 8   Period Weeks   Status On-going   PT LONG TERM GOAL #3   Title The patient will improve SIS: mobility from 33% up to 45%.  Modified target date 11/08/2014   Time 8   Period Weeks   Status On-going   PT LONG TERM GOAL #4   Title The patient will improve gait speed from 1.44 ft/sec up to 2.3 ft/sec to demo improving functional ambulation. Modified target date 11/08/2014   Baseline Pt's gait speed is 1.07 ft/sec without a device.  Continue emphasizing walking pace.   Time 8   Period Weeks   Status On-going   PT LONG TERM GOAL #5   Title The patient will negotiate 4 steps modified indep with reciprocal pattern and one HR.   Modified target date 11/08/2014   Baseline On 10/04/2014, patient uses step to pattern with one handrail with supervision.   Time 8   Period Weeks   Status On-going   PT LONG TERM GOAL #6   Title The patient will negotiate level community surfaces without a device x 5 minutes independently for improved community mobility.  Modified target date 11/08/2014   Time 8   Period Weeks   Status On-going               Plan - 10/04/14 1435    Clinical Impression Statement The patient met 2 STGs.  She continues with largest deficit noted in gait speed and endurance for extended ambulation. PT to continue LTGs x 1 more month modifying the target date to 11/08/2014.  The patient has variable performance on Berg and gait speed from day to  day.  PT to focus on consistency and endurance.   Pt will benefit from skilled therapeutic intervention in order to improve on the following deficits Decreased balance;Difficulty walking;Postural dysfunction;Decreased strength;Decreased endurance;Decreased activity tolerance;Decreased mobility;Pain;Abnormal gait   Rehab Potential Good   PT Frequency 2x / week   PT Duration 4 weeks   PT Treatment/Interventions Gait training;Therapeutic exercise;Patient/family education;Balance training;Stair training;Neuromuscular re-education;Therapeutic activities   PT Next Visit Plan Reciprocal gait on steps with assistance, endurance, L dorsiflexion control, balance training, focus on SPEED of movement   Consulted and Agree with Plan of Care Patient;Family member/caregiver   Family Member Consulted daughter        Problem List Patient Active Problem List   Diagnosis Date Noted  . Spastic hemiplegia affecting nondominant side 07/25/2014  . 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 Left hemiparesis 06/10/2014  . Left-sided neglect 06/10/2014  . 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 Morbid obesity- BMI 40 06/06/2014  . Acute respiratory failure with hypoxia 06/05/2014  . Cerebral infarction due to occlusion of right middle cerebral artery 06/03/2014  . Cerebral infarct 06/03/2014  . Altered mental status 06/03/2014  . GERD (gastroesophageal reflux disease) 04/12/2014  . Benign essential HTN 02/07/2014  . Dyslipidemia- statin intol 02/07/2014  . Chest pain- Low risk Myoview July 2015 02/06/2014    Juma Oxley, PT 10/04/2014, 2:47 PM  [facility] [address] [place], [place], [address] Phone: [phone]   Fax:  [phone]

## 2014-10-05 ENCOUNTER — Ambulatory Visit (INDEPENDENT_AMBULATORY_CARE_PROVIDER_SITE_OTHER): Payer: Medicare Other | Admitting: *Deleted

## 2014-10-05 DIAGNOSIS — I63411 Cerebral infarction due to embolism of right middle cerebral artery: Secondary | ICD-10-CM | POA: Diagnosis not present

## 2014-10-05 LAB — MDC_IDC_ENUM_SESS_TYPE_REMOTE

## 2014-10-06 ENCOUNTER — Ambulatory Visit: Payer: Medicare Other | Admitting: Occupational Therapy

## 2014-10-06 ENCOUNTER — Ambulatory Visit: Payer: Medicare Other

## 2014-10-06 VITALS — BP 147/87 | HR 83

## 2014-10-06 DIAGNOSIS — H539 Unspecified visual disturbance: Secondary | ICD-10-CM | POA: Diagnosis not present

## 2014-10-06 DIAGNOSIS — G8114 Spastic hemiplegia affecting left nondominant side: Secondary | ICD-10-CM

## 2014-10-06 DIAGNOSIS — R269 Unspecified abnormalities of gait and mobility: Secondary | ICD-10-CM

## 2014-10-06 DIAGNOSIS — M25512 Pain in left shoulder: Secondary | ICD-10-CM | POA: Diagnosis not present

## 2014-10-06 DIAGNOSIS — R4189 Other symptoms and signs involving cognitive functions and awareness: Secondary | ICD-10-CM

## 2014-10-06 DIAGNOSIS — Z7409 Other reduced mobility: Secondary | ICD-10-CM | POA: Diagnosis not present

## 2014-10-06 NOTE — Therapy (Signed)
[facility] [address] [place], [place], [address] Phone: [phone]   Fax:  [phone]  Physical Therapy Treatment  Patient Details  Name: Mary Le MRN: [id number] Date of Birth: 08/08/1945 Referring Provider:  Greene, Jeffrey R, MD  Encounter Date: 10/06/2014      PT End of Session - 10/06/14 1613    Visit Number 12   Number of Visits 24   Date for PT Re-Evaluation 11/08/14   PT Start Time 1402   PT Stop Time 1445   PT Time Calculation (min) 43 min   Equipment Utilized During Treatment Gait belt   Activity Tolerance Patient tolerated treatment well   Behavior During Therapy WFL for tasks assessed/performed      Past Medical History  Diagnosis Date  . Hypertension   . Hypercholesteremia   . Back pain     arthritis  . CVA (cerebral infarction)     Past Surgical History  Procedure Laterality Date  . Appendectomy    . Tonsillectomy    . Hemorroidectomy    . Tubal ligation    . Radiology with anesthesia N/A 06/03/2014    Procedure: RADIOLOGY WITH ANESTHESIA;  Surgeon: Sanjeev K Deveshwar, MD;  Location: [facility] OR;  Service: Radiology;  Laterality: N/A;  . Tee without cardioversion N/A 06/07/2014    Procedure: TRANSESOPHAGEAL ECHOCARDIOGRAM (TEE);  Surgeon: Brian S Crenshaw, MD;  Location: [facility] ENDOSCOPY;  Service: Cardiovascular;  Laterality: N/A;  . Loop recorder implant  06/07/2014    MDT LINQ implanted by Dr Allred for cryptogenic stroke  . Loop recorder implant N/A 06/07/2014    Procedure: LOOP RECORDER IMPLANT;  Surgeon: James D Allred, MD;  Location: [facility] CATH LAB;  Service: Cardiovascular;  Laterality: N/A;    Filed Vitals:   10/06/14 1419  BP: 147/87  Pulse: 83    Visit Diagnosis:  No diagnosis found.      Subjective Assessment - 10/06/14 1408    Symptoms No falls.  Taking more Tylenol now than Tramadol.  Try to walk 2-3 times a day in house, but been walking some outside on sidewalk around  house.   How long can you stand comfortably? 4-5 minutes   Currently in Pain? No/denies  only when standing too long                       [facility] Adult PT Treatment/Exercise - 10/06/14 0001    Ambulation/Gait   Ambulation/Gait Yes   Ambulation/Gait Assistance 4: Min guard;5: Supervision   Ambulation/Gait Assistance Details cues for posture, assist for safety due to one episode of catching left foot when walking into clinic   Ambulation Distance (Feet) 230 Feet   Assistive device Straight cane   Gait Pattern Step-through pattern;Decreased stride length;Poor foot clearance - left;Trunk flexed   Ambulation Surface Level;Indoor   High Level Balance   High Level Balance Activities Negotiating over obstacles  stepping through flor ladder with cane and min assist   High Level Balance Comments alternate tapping over balance bubbles with cane and min assist    Lumbar Exercises: Aerobic   Tread Mill 0.5-0.4 mph with left UE support and min assist x ttal of about 3.5 min with 2 seated rests and cues for posture, heel strike, step length                  PT Short Term Goals - 10/06/14 1617    PT SHORT TERM GOAL #1   Title   The patient will return demo HEP indep with written cues.  Target date 09/09/2014   Baseline Pt is walking, but not doing regular leg exercises.   Time 4   Period Weeks   Status On-going   PT SHORT TERM GOAL #2   Title The patient will improve Berg score from 27/56 up to 35/56 to demo decreasing risk for falls.  Target date 09/09/2014   Baseline Goal met on 10/04/2014 with patient improving to 39/56.   Time 4   Period Weeks   Status Achieved   PT SHORT TERM GOAL #3   Title The patient will improve gait speed from 1.44 f t/sec up to 1.8 ft/sec to demo decreasing risk for falls.  Target date 09/09/2014   Baseline No device=1.1 ft/sec and with SPC=   Time 4   Period Weeks   Status On-going   PT SHORT TERM GOAL #4   Title The patient will ambulate  household surfaces without a device independently x 400 ft for improved independence in the home.  Target date 09/09/2014   Baseline Pt fatigues after 230 ft of gait.   Time 4   Period Weeks   Status On-going   PT SHORT TERM GOAL #5   Title The patient will move sit<>supine to the right and left sides independently.  Target date 09/09/2014   Baseline Per subjective reports from daughter that patient independent with bed mobility.   Time 4   Period Weeks   Status Achieved           PT Long Term Goals - 10/06/14 1618    PT LONG TERM GOAL #1   Title The patient will verbalize understanding of CVA risk factors/warning signs.  TArget date 10/08/2014   Baseline Reviewed on 10/04/2014   Time 8   Period Weeks   Status Achieved   PT LONG TERM GOAL #2   Title The patient will improve Berg score from 27/56 up to 40/56 to demo decreased risk for falls.  Modified target date 11/08/2014   Baseline Berg=39/56 on 10/04/2014   Time 8   Period Weeks   Status On-going   PT LONG TERM GOAL #3   Title The patient will improve SIS: mobility from 33% up to 45%.  Modified target date 11/08/2014   Time 8   Period Weeks   Status On-going   PT LONG TERM GOAL #4   Title The patient will improve gait speed from 1.44 ft/sec up to 2.3 ft/sec to demo improving functional ambulation. Modified target date 11/08/2014   Baseline Pt's gait speed is 1.07 ft/sec without a device.  Continue emphasizing walking pace.   Time 8   Period Weeks   Status On-going   PT LONG TERM GOAL #5   Title The patient will negotiate 4 steps modified indep with reciprocal pattern and one HR.   Modified target date 11/08/2014   Baseline On 10/04/2014, patient uses step to pattern with one handrail with supervision.   Time 8   Period Weeks   Status On-going   PT LONG TERM GOAL #6   Title The patient will negotiate level community surfaces without a device x 5 minutes independently for improved community mobility.  Modified target date  11/08/2014   Time 8   Period Weeks   Status On-going               Plan - 10/06/14 1614    Clinical Impression Statement Patient with catching left foot due to wearing   shoes with stickier soles per her report.  Able to work on foot clearance activities today.  Also limited on treadmill due to novelty and pt willing to try further to work on gait speed, endurance and clearance.     Pt will benefit from skilled therapeutic intervention in order to improve on the following deficits Decreased balance;Difficulty walking;Postural dysfunction;Decreased strength;Decreased endurance;Decreased activity tolerance;Decreased mobility;Pain;Abnormal gait   Rehab Potential Good   PT Frequency 2x / week   PT Duration 4 weeks   PT Treatment/Interventions Gait training;Therapeutic exercise;Patient/family education;Balance training;Stair training;Neuromuscular re-education;Therapeutic activities   PT Next Visit Plan Continue treadmill trial with rest breaks and encouragement.  activities for left foot clearance, heel strike   PT Home Exercise Plan Issued HEP today.   Consulted and Agree with Plan of Care Patient;Family member/caregiver        Problem List Patient Active Problem List   Diagnosis Date Noted  . Spastic hemiplegia affecting nondominant side 07/25/2014  . 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 Left hemiparesis 06/10/2014  . Left-sided neglect 06/10/2014  . 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 Morbid obesity- BMI 40 06/06/2014  . Acute respiratory failure with hypoxia 06/05/2014  . Cerebral infarction due to occlusion of right middle cerebral artery 06/03/2014  . Cerebral infarct 06/03/2014  . Altered mental status 06/03/2014  . GERD (gastroesophageal reflux disease) 04/12/2014  . Benign essential HTN 02/07/2014  . Dyslipidemia- statin intol 02/07/2014  . Chest pain- Low risk Myoview July 2015 02/06/2014     Anaalicia Reimann,CYNDI 10/06/2014, 4:20 PM  [facility], [facility] [address] [place], [place], [address] Phone: [phone]   Fax:  [phone]

## 2014-10-06 NOTE — Patient Instructions (Signed)
Laying on your back in middle of bed roll to left side the right with hands clasped together.10x Laying on your right side, have someone support your left hand try to stretch hand forward and back 10x  Wear soft elbow brace at night after srtetching elbow in extension, stop wearing if increased pain.

## 2014-10-06 NOTE — Therapy (Signed)
[facility] [address] [place], [place], [address] Phone: [phone]   Fax:  [phone]  Occupational Therapy Treatment  Patient Details  Name: Mary Le MRN: [id number] Date of Birth: [birth year] Referring Provider:  [name], MD  Encounter Date: 10/06/2014      OT End of Session - 10/06/14 1453    Visit Number 12   Date for OT Re-Evaluation 10/04/14   Authorization Type medicare - needs G code!   Authorization - Visit Number 13   Authorization - Number of Visits 20   OT Start Time 4097   OT Stop Time 1530   OT Time Calculation (min) 43 min      Past Medical History  Diagnosis Date  . Hypertension   . Hypercholesteremia   . Back pain     arthritis  . CVA (cerebral infarction)     Past Surgical History  Procedure Laterality Date  . Appendectomy    . Tonsillectomy    . Hemorroidectomy    . Tubal ligation    . Radiology with anesthesia N/A 06/03/2014    Procedure: RADIOLOGY WITH ANESTHESIA;  Surgeon: [name], MD;  Location: [facility];  Service: Radiology;  Laterality: N/A;  . Tee without cardioversion N/A 06/07/2014    Procedure: TRANSESOPHAGEAL ECHOCARDIOGRAM (TEE);  Surgeon: [name], MD;  Location: [facility] ENDOSCOPY;  Service: Cardiovascular;  Laterality: N/A;  . Loop recorder implant  06/07/2014    MDT LINQ implanted by Dr [name] for cryptogenic stroke  . Loop recorder implant N/A 06/07/2014    Procedure: LOOP RECORDER IMPLANT;  Surgeon: [name], MD;  Location: [facility] CATH LAB;  Service: Cardiovascular;  Laterality: N/A;    There were no vitals filed for this visit.  Visit Diagnosis:  Pain in joint, shoulder region, left  Spastic hemiplegia affecting left nondominant side  Impaired cognition      Subjective Assessment - 10/06/14 1453    Pertinent History see epic snapshot;  HTN, fluctuating BP since stroke   Patient Stated Goals to use LUE   Currently in Pain? Yes    Pain Score 4    Pain Location Arm   Pain Orientation Left   Pain Type Acute pain   Aggravating Factors  malpositioning   Pain Relieving Factors repositioning   Multiple Pain Sites No             Treatment: Seated edge of mat, shoulder flexion reach for the floor, followed by gentle weightbearing through LUE, with mod facillitation. Supine. AA/ROM shoulder flexion in supine and sidelying, as well as  Rolling side to side with hands clasped for trunk rotation and weightbearing through LUE, mod facillitation. UBE x 3 mins with LUE wrapped and therapist facillitationg left elbow. Bean bag splint issued for increased elbow extension, gentle passive elbow extension prior to application.               OT Education - 10/06/14 1625    Education provided Yes   Education Details Supine exercises for decreasesing spasticity/ trunk rotation, and beanbag splint wear/ care   Person(s) Educated Patient;Child(ren)   Methods Explanation;Demonstration;Verbal cues;Handout   Comprehension Verbalized understanding;Returned demonstration          OT Short Term Goals - 10/04/14 1227    OT SHORT TERM GOAL #1   Title Pt and daughter will be I with HEP - 2/16/20126   Status Achieved   OT SHORT TERM GOAL #2   Title Pt will  require a with back only for UE body bathing in shower   Status Achieved  Pt only requires assist with back   OT SHORT TERM GOAL #3   Title Pt will be close s for LB  bathing in shower   Status Achieved  Pt is supervision   OT SHORT TERM GOAL #4   Title  Pt will be min afor UB dressing with AE prn   Status Achieved  pt reports she needs a little help with long sleeve shirts.   OT SHORT TERM GOAL #5   Title Pt will be min a for LB dressing with AE prn   Status Achieved   Additional Short Term Goals   Additional Short Term Goals Yes   OT SHORT TERM GOAL #6   Title Pt will report no more than 4/10 pain in LUE with HEP   Baseline 4-6/10   Status Partially Met    OT SHORT TERM GOAL #7   Title Pt will attend to L visual field during functional tasks with min vc's   Status Achieved   OT SHORT TERM GOAL #8   Title I with updated HEP.   Baseline ck 11/04/14   Time 4   Period Weeks   Status New           OT Long Term Goals - 10/04/14 1204    OT LONG TERM GOAL #1   Title Pt and daughter will be mod I with updated HEP prn - 10/04/2014   Status Achieved   OT LONG TERM GOAL #2   Title Pt will be supervision with simple meal prep at ambulatory level   Status Achieved   OT LONG TERM GOAL #3   Title Pt will be mod I with toilet hygiene   Status Achieved   OT LONG TERM GOAL #4   Title Pt will be able to read 3 sentence paragraph with strategies with min vc's   Status Achieved   OT LONG TERM GOAL #5   Title Pt and daughter will be able to identify 2 strategies to compensate for memory deficit.   Status Achieved   Long Term Additional Goals   Additional Long Term Goals Yes   OT LONG TERM GOAL #6   Title Pt will report no greater than 3/10 pain in LUE with HEP   Baseline pain 4-6/10 at times   Status Not Met   OT LONG TERM GOAL #7   Title Pt will demonstrate ability to use LUE as gross stabilizer with basic self care tasks   Status Achieved   OT LONG TERM GOAL #8   Title Pt will demonstrate 45 * shoulder flexion/ scaption in prep for functional reach   Baseline 10/04/14- demonstrates grossly 35-40*   Time 8   Period Weeks   Status New   OT LONG TERM GOAL  #9   Baseline Pt will report that she is using LUE as a gross assist/ stabilizer at least 25 % of the time for ADLS   Time 8   Period Weeks   Status New               Plan - 10/06/14 1627    Clinical Impression Statement Pt is progressing towards goals with good response to exercises in supine for trunk rotation/ LUE.   Plan neuro re-e ed   OT Home Exercise Plan supine LUE sidelying, and trunk rotation- issued 10/06/14   Consulted and Agree with Plan of Care Patient;Family  member/caregiver  Family Member Consulted daughter        Problem List Patient Active Problem List   Diagnosis Date Noted  . Spastic hemiplegia affecting nondominant side 07/25/2014  . 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 Left hemiparesis 06/10/2014  . Left-sided neglect 06/10/2014  . 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 Morbid obesity- BMI 40 06/06/2014  . Acute respiratory failure with hypoxia 06/05/2014  . Cerebral infarction due to occlusion of right middle cerebral artery 06/03/2014  . Cerebral infarct 06/03/2014  . Altered mental status 06/03/2014  . GERD (gastroesophageal reflux disease) 04/12/2014  . Benign essential HTN 02/07/2014  . Dyslipidemia- statin intol 02/07/2014  . Chest pain- Low risk Myoview July 2015 02/06/2014    RINE,KATHRYN 10/06/2014, 4:35 PM Theone Murdoch, OTR/L [phone] Phone: [phone] 4:35 PM 10/06/2014 Eugenio Saenz [address] [place], [place], [address] Phone: [phone]   Fax:  [phone]

## 2014-10-07 NOTE — Progress Notes (Signed)
Loop recorder 

## 2014-10-11 ENCOUNTER — Encounter: Payer: Self-pay | Admitting: Rehabilitative and Restorative Service Providers"

## 2014-10-11 ENCOUNTER — Ambulatory Visit: Payer: Medicare Other | Admitting: Physical Therapy

## 2014-10-11 ENCOUNTER — Ambulatory Visit: Payer: Medicare Other | Admitting: Occupational Therapy

## 2014-10-11 DIAGNOSIS — M25512 Pain in left shoulder: Secondary | ICD-10-CM

## 2014-10-11 DIAGNOSIS — R269 Unspecified abnormalities of gait and mobility: Secondary | ICD-10-CM

## 2014-10-11 DIAGNOSIS — Z7409 Other reduced mobility: Secondary | ICD-10-CM | POA: Diagnosis not present

## 2014-10-11 DIAGNOSIS — R4189 Other symptoms and signs involving cognitive functions and awareness: Secondary | ICD-10-CM

## 2014-10-11 DIAGNOSIS — G8114 Spastic hemiplegia affecting left nondominant side: Secondary | ICD-10-CM | POA: Diagnosis not present

## 2014-10-11 DIAGNOSIS — H539 Unspecified visual disturbance: Secondary | ICD-10-CM | POA: Diagnosis not present

## 2014-10-11 NOTE — Therapy (Signed)
[facility] [address] [place], [place], [address] Phone: [phone]   Fax:  [phone]  Physical Therapy Treatment  Patient Details  Name: Mary Le MRN: [id number] Date of Birth: [birth year] Referring Provider:  [name], MD  Encounter Date: 10/11/2014      PT End of Session - 10/11/14 1238    Visit Number 13   Number of Visits 24   Date for PT Re-Evaluation 11/08/14   PT Start Time 1230   PT Stop Time 1315   PT Time Calculation (min) 45 min   Equipment Utilized During Treatment Gait belt   Activity Tolerance Patient tolerated treatment well   Behavior During Therapy [facility] for tasks assessed/performed      Past Medical History  Diagnosis Date  . Hypertension   . Hypercholesteremia   . Back pain     arthritis  . CVA (cerebral infarction)     Past Surgical History  Procedure Laterality Date  . Appendectomy    . Tonsillectomy    . Hemorroidectomy    . Tubal ligation    . Radiology with anesthesia N/A 06/03/2014    Procedure: RADIOLOGY WITH ANESTHESIA;  Surgeon: [name], MD;  Location: [facility];  Service: Radiology;  Laterality: N/A;  . Tee without cardioversion N/A 06/07/2014    Procedure: TRANSESOPHAGEAL ECHOCARDIOGRAM (TEE);  Surgeon: [name], MD;  Location: [facility] ENDOSCOPY;  Service: Cardiovascular;  Laterality: N/A;  . Loop recorder implant  06/07/2014    MDT LINQ implanted by Dr [name] for cryptogenic stroke  . Loop recorder implant N/A 06/07/2014    Procedure: LOOP RECORDER IMPLANT;  Surgeon: [name], MD;  Location: [facility] CATH LAB;  Service: Cardiovascular;  Laterality: N/A;    There were no vitals filed for this visit.  Visit Diagnosis:  Abnormality of gait  Impaired functional mobility and activity tolerance      Subjective Assessment - 10/11/14 1235    Symptoms Woke up with more pain this am (foot, arm and back). Reports she has been walking more at home as  well.    Currently in Pain? Yes   Pain Score 3    Pain Location Arm  plus some in back/left foot   Pain Orientation Left   Pain Descriptors / Indicators Aching;Sore;Tender;Throbbing   Pain Type Chronic pain   Pain Onset More than a month ago   Pain Frequency Intermittent   Aggravating Factors  malpositioning   Pain Relieving Factors proper mechanics/positioning          [facility] Adult PT Treatment/Exercise - 10/11/14 1243    Ambulation/Gait   Ambulation/Gait Yes   Ambulation/Gait Assistance 4: Min guard   Ambulation Distance (Feet) 230 Feet   Assistive device Straight cane   Gait Pattern Step-through pattern;Decreased stride length;Poor foot clearance - left;Trunk flexed   Ambulation Surface Level;Indoor   High Level Balance   High Level Balance Activities Negotiating over obstacles   High Level Balance Comments stepping over 4 bolters of varied heights: x 6 laps with cane, min assist to min guard assist with cues on posture and sequence     Neuro Re-ed continued: Standing without UE support or cane - stepping over and back with left leg using black foam beam on tall side x 10 reps with cues on increased hip/knee flexion - with right rail assist: forward/backward toe taps floor-1st step-2cd step and back x 10 reps with assist to clear 2cd step and cues on hip/knee flexion  with coming back down - alternating heel taps to 4 inch box x 10 each side, to 6 inch box x 10 each side.   Exercise Scifit x 4 extremities level 2.0 x 8 minutes with goal rpm >/= 30 for strengthening and activity tolerance. Cues to increase and maintain speed needed.       PT Short Term Goals - 10/06/14 1617    PT SHORT TERM GOAL #1   Title The patient will return demo HEP indep with written cues.  Target date 09/09/2014   Baseline Pt is walking, but not doing regular leg exercises.   Time 4   Period Weeks   Status On-going   PT SHORT TERM GOAL #2   Title The patient will improve Berg score from 27/56  up to 35/56 to demo decreasing risk for falls.  Target date 09/09/2014   Baseline Goal met on 10/04/2014 with patient improving to 39/56.   Time 4   Period Weeks   Status Achieved   PT SHORT TERM GOAL #3   Title The patient will improve gait speed from 1.44 f t/sec up to 1.8 ft/sec to demo decreasing risk for falls.  Target date 09/09/2014   Baseline No device=1.1 ft/sec and with SPC=   Time 4   Period Weeks   Status On-going   PT SHORT TERM GOAL #4   Title The patient will ambulate household surfaces without a device independently x 400 ft for improved independence in the home.  Target date 09/09/2014   Baseline Pt fatigues after 230 ft of gait.   Time 4   Period Weeks   Status On-going   PT SHORT TERM GOAL #5   Title The patient will move sit<>supine to the right and left sides independently.  Target date 09/09/2014   Baseline Per subjective reports from daughter that patient independent with bed mobility.   Time 4   Period Weeks   Status Achieved           PT Long Term Goals - 10/06/14 1618    PT LONG TERM GOAL #1   Title The patient will verbalize understanding of CVA risk factors/warning signs.  TArget date 10/08/2014   Baseline Reviewed on 10/04/2014   Time 8   Period Weeks   Status Achieved   PT LONG TERM GOAL #2   Title The patient will improve Berg score from 27/56 up to 40/56 to demo decreased risk for falls.  Modified target date 11/08/2014   Baseline Berg=39/56 on 10/04/2014   Time 8   Period Weeks   Status On-going   PT LONG TERM GOAL #3   Title The patient will improve SIS: mobility from 33% up to 45%.  Modified target date 11/08/2014   Time 8   Period Weeks   Status On-going   PT LONG TERM GOAL #4   Title The patient will improve gait speed from 1.44 ft/sec up to 2.3 ft/sec to demo improving functional ambulation. Modified target date 11/08/2014   Baseline Pt's gait speed is 1.07 ft/sec without a device.  Continue emphasizing walking pace.   Time 8   Period  Weeks   Status On-going   PT LONG TERM GOAL #5   Title The patient will negotiate 4 steps modified indep with reciprocal pattern and one HR.   Modified target date 11/08/2014   Baseline On 10/04/2014, patient uses step to pattern with one handrail with supervision.   Time 8   Period Weeks   Status On-going  PT LONG TERM GOAL #6   Title The patient will negotiate level community surfaces without a device x 5 minutes independently for improved community mobility.  Modified target date 11/08/2014   Time 8   Period Weeks   Status On-going               Plan - 10/11/14 1239    Clinical Impression Statement Focued on increased hip/knee flexion on left with gait and through activities without any complaints from pt. Progressing toward goals.   Pt will benefit from skilled therapeutic intervention in order to improve on the following deficits Decreased balance;Difficulty walking;Postural dysfunction;Decreased strength;Decreased endurance;Decreased activity tolerance;Decreased mobility;Pain;Abnormal gait   Rehab Potential Good   PT Frequency 2x / week   PT Duration 4 weeks   PT Treatment/Interventions Gait training;Therapeutic exercise;Patient/family education;Balance training;Stair training;Neuromuscular re-education;Therapeutic activities   PT Next Visit Plan Continue treadmill trial with rest breaks and encouragement.  activities for left foot clearance, heel strike   PT Home Exercise Plan Issued HEP today.   Consulted and Agree with Plan of Care Patient;Family member/caregiver        Problem List Patient Active Problem List   Diagnosis Date Noted  . Spastic hemiplegia affecting nondominant side 07/25/2014  . 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 Left hemiparesis 06/10/2014  . Left-sided neglect 06/10/2014  . 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 Morbid obesity- BMI 40 06/06/2014  . Acute respiratory  failure with hypoxia 06/05/2014  . Cerebral infarction due to occlusion of right middle cerebral artery 06/03/2014  . Cerebral infarct 06/03/2014  . Altered mental status 06/03/2014  . GERD (gastroesophageal reflux disease) 04/12/2014  . Benign essential HTN 02/07/2014  . Dyslipidemia- statin intol 02/07/2014  . Chest pain- Low risk Myoview July 2015 02/06/2014    Willow Ora 10/11/2014, 3:07 PM  Willow Ora, PTA, [facility] [address] [name], [place] [address] [phone] 10/11/2014, 3:07 PM

## 2014-10-11 NOTE — Therapy (Signed)
[facility] [address] [place], [place], [address] Phone: [phone]   Fax:  [phone]  Occupational Therapy Treatment  Patient Details  Name: Mary Le MRN: [id number] Date of Birth: 04/07/1946 Referring Provider:  [name], MD  Encounter Date: 10/11/2014      OT End of Session - 10/11/14 1331    Visit Number 14   Date for OT Re-Evaluation 11/03/13  ck STG's   Authorization Type medicare - needs G code!   Authorization Time Period check STG's 11/04/14   Authorization - Visit Number 40   Authorization - Number of Visits 20   OT Start Time 3545   OT Stop Time 1400   OT Time Calculation (min) 42 min   Activity Tolerance Patient tolerated treatment well   Behavior During Therapy WFL for tasks assessed/performed      Past Medical History  Diagnosis Date  . Hypertension   . Hypercholesteremia   . Back pain     arthritis  . CVA (cerebral infarction)     Past Surgical History  Procedure Laterality Date  . Appendectomy    . Tonsillectomy    . Hemorroidectomy    . Tubal ligation    . Radiology with anesthesia N/A 06/03/2014    Procedure: RADIOLOGY WITH ANESTHESIA;  Surgeon: [name], MD;  Location: [facility];  Service: Radiology;  Laterality: N/A;  . Tee without cardioversion N/A 06/07/2014    Procedure: TRANSESOPHAGEAL ECHOCARDIOGRAM (TEE);  Surgeon: [name], MD;  Location: [facility] ENDOSCOPY;  Service: Cardiovascular;  Laterality: N/A;  . Loop recorder implant  06/07/2014    MDT LINQ implanted by Dr [name] for cryptogenic stroke  . Loop recorder implant N/A 06/07/2014    Procedure: LOOP RECORDER IMPLANT;  Surgeon: [name], MD;  Location: [facility] CATH LAB;  Service: Cardiovascular;  Laterality: N/A;    There were no vitals filed for this visit.  Visit Diagnosis:  Pain in joint, shoulder region, left  Spastic hemiplegia affecting left nondominant side  Impaired cognition       Subjective Assessment - 10/11/14 1320    Symptoms Pt reports her arm hurt when she awoke   Pertinent History see epic snapshot;  HTN, fluctuating BP since stroke   Patient Stated Goals to use LUE   Currently in Pain? Yes   Pain Score 4    Pain Location Arm   Pain Orientation Left   Pain Descriptors / Indicators Aching   Pain Type Chronic pain   Pain Onset More than a month ago   Pain Frequency Intermittent   Aggravating Factors  malpositioning   Pain Relieving Factors reposistioning   Multiple Pain Sites No         Treatment: Hotpack applied to left upper arm and elbow x 10 mins while therapist performed gentle P/ROM to wrist for ulnar deviation, wrist flexion/ extension, fingers and elbow extension. No adverse reactions. Pt performed trunk rotation side/side with hands clasped in supine to promote elbow extension/ shoulder flexion.  Seated UE Ranger for shoulder flexion AA/ROM, min facillitation/ v.c P/ROM wrist flexion followed by A/ROM wrist extension. Pt demonstrates new trace wrist extension.                   OT Education - 10/11/14 1709    Education provided Yes   Education Details wrist P/ROM flexion with A/ROM extension   Person(s) Educated Patient;Child(ren)   Methods Explanation;Demonstration;Verbal cues;Handout   Comprehension Verbalized understanding;Returned demonstration  OT Short Term Goals - 10/04/14 1227    OT SHORT TERM GOAL #1   Title Pt and daughter will be I with HEP - 2/16/20126   Status Achieved   OT SHORT TERM GOAL #2   Title Pt will require a with back only for UE body bathing in shower   Status Achieved  Pt only requires assist with back   OT SHORT TERM GOAL #3   Title Pt will be close s for LB  bathing in shower   Status Achieved  Pt is supervision   OT SHORT TERM GOAL #4   Title  Pt will be min afor UB dressing with AE prn   Status Achieved  pt reports she needs a little help with long sleeve shirts.   OT SHORT  TERM GOAL #5   Title Pt will be min a for LB dressing with AE prn   Status Achieved   Additional Short Term Goals   Additional Short Term Goals Yes   OT SHORT TERM GOAL #6   Title Pt will report no more than 4/10 pain in LUE with HEP   Baseline 4-6/10   Status Partially Met   OT SHORT TERM GOAL #7   Title Pt will attend to L visual field during functional tasks with min vc's   Status Achieved   OT SHORT TERM GOAL #8   Title I with updated HEP.   Baseline ck 11/04/14   Time 4   Period Weeks   Status New           OT Long Term Goals - 10/04/14 1204    OT LONG TERM GOAL #1   Title Pt and daughter will be mod I with updated HEP prn - 10/04/2014   Status Achieved   OT LONG TERM GOAL #2   Title Pt will be supervision with simple meal prep at ambulatory level   Status Achieved   OT LONG TERM GOAL #3   Title Pt will be mod I with toilet hygiene   Status Achieved   OT LONG TERM GOAL #4   Title Pt will be able to read 3 sentence paragraph with strategies with min vc's   Status Achieved   OT LONG TERM GOAL #5   Title Pt and daughter will be able to identify 2 strategies to compensate for memory deficit.   Status Achieved   Long Term Additional Goals   Additional Long Term Goals Yes   OT LONG TERM GOAL #6   Title Pt will report no greater than 3/10 pain in LUE with HEP   Baseline pain 4-6/10 at times   Status Not Met   OT LONG TERM GOAL #7   Title Pt will demonstrate ability to use LUE as gross stabilizer with basic self care tasks   Status Achieved   OT LONG TERM GOAL #8   Title Pt will demonstrate 45 * shoulder flexion/ scaption in prep for functional reach   Baseline 10/04/14- demonstrates grossly 35-40*   Time 8   Period Weeks   Status New   OT LONG TERM GOAL  #9   Baseline Pt will report that she is using LUE as a gross assist/ stabilizer at least 25 % of the time for ADLS   Time 8   Period Weeks   Status New               Plan - 10/11/14 1700    Clinical  Impression Statement Pt is progressing  towards goals. Elbow extension is now improved and pt demonstrates trace A/ROM wrist extension.   Pt will benefit from skilled therapeutic intervention in order to improve on the following deficits (Retired) Decreased activity tolerance;Decreased balance;Decreased cognition;Decreased coordination;Decreased range of motion;Decreased safety awareness;Decreased mobility;Decreased knowledge of use of DME;Decreased strength;Increased edema;Impaired UE functional use;Impaired tone;Pain;Impaired sensation;Impaired perceived functional ability;Improper body mechanics;Obesity   Clinical Impairments Affecting Rehab Potential pain, spasticity   OT Frequency 2x / week   OT Duration 8 weeks   OT Treatment/Interventions Self-care/ADL training;Ultrasound;Moist Heat;Fluidtherapy;Therapeutic exercise;Neuromuscular education;Energy conservation;Manual Therapy;Functional Mobility Training;DME and/or AE instruction;Passive range of motion;Visual/perceptual remediation/compensation;Cognitive remediation/compensation;Therapeutic activities;Splinting;Patient/family education;Balance training   Plan neuro re-ed   OT Home Exercise Plan supine LUE sidelying, and trunk rotation- issued 10/06/14   Consulted and Agree with Plan of Care Patient   Family Member Consulted daughter        Problem List Patient Active Problem List   Diagnosis Date Noted  . Spastic hemiplegia affecting nondominant side 07/25/2014  . 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 Left hemiparesis 06/10/2014  . Left-sided neglect 06/10/2014  . 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 Morbid obesity- BMI 40 06/06/2014  . Acute respiratory failure with hypoxia 06/05/2014  . Cerebral infarction due to occlusion of right middle cerebral artery 06/03/2014  . Cerebral infarct 06/03/2014  . Altered mental status 06/03/2014  . GERD (gastroesophageal  reflux disease) 04/12/2014  . Benign essential HTN 02/07/2014  . Dyslipidemia- statin intol 02/07/2014  . Chest pain- Low risk Myoview July 2015 02/06/2014    Samayra Hebel 10/11/2014, 5:10 PM Theone Murdoch, OTR/L [phone] Phone: [phone] 5:10 PM 03/22/2016[facility] [address] [place], [place], [address] Phone: [phone]   Fax:  [phone]

## 2014-10-11 NOTE — Patient Instructions (Addendum)
Gently bend left wrist in flexion assisting with your right hand, raise your left wrist (extension)on its own, 10 reps 1-3 x day  Practice straightening your left elbow on it's own, 10 reps 2x day

## 2014-10-13 ENCOUNTER — Ambulatory Visit: Payer: Medicare Other | Admitting: Physical Therapy

## 2014-10-13 ENCOUNTER — Ambulatory Visit: Payer: Medicare Other | Admitting: Occupational Therapy

## 2014-10-18 ENCOUNTER — Ambulatory Visit: Payer: Medicare Other | Admitting: Occupational Therapy

## 2014-10-18 ENCOUNTER — Encounter: Payer: Self-pay | Admitting: Physical Therapy

## 2014-10-18 ENCOUNTER — Ambulatory Visit: Payer: Medicare Other | Admitting: Physical Therapy

## 2014-10-18 DIAGNOSIS — M25512 Pain in left shoulder: Secondary | ICD-10-CM | POA: Diagnosis not present

## 2014-10-18 DIAGNOSIS — R4189 Other symptoms and signs involving cognitive functions and awareness: Secondary | ICD-10-CM

## 2014-10-18 DIAGNOSIS — G8114 Spastic hemiplegia affecting left nondominant side: Secondary | ICD-10-CM | POA: Diagnosis not present

## 2014-10-18 DIAGNOSIS — R269 Unspecified abnormalities of gait and mobility: Secondary | ICD-10-CM

## 2014-10-18 DIAGNOSIS — H539 Unspecified visual disturbance: Secondary | ICD-10-CM | POA: Diagnosis not present

## 2014-10-18 DIAGNOSIS — Z7409 Other reduced mobility: Secondary | ICD-10-CM | POA: Diagnosis not present

## 2014-10-18 NOTE — Therapy (Signed)
[facility] [address] [place], [place], [address] Phone: [phone]   Fax:  [phone]  Physical Therapy Treatment  Patient Details  Name: Mary Le MRN: [id number] Date of Birth: 11-30-1945 Referring Provider:  [name], MD  Encounter Date: 10/18/2014      PT End of Session - 10/18/14 1450    Visit Number 14  G4   Number of Visits 24   Date for PT Re-Evaluation 11/08/14   PT Start Time 1446   PT Stop Time 1530   PT Time Calculation (min) 44 min   Equipment Utilized During Treatment Gait belt   Activity Tolerance Patient tolerated treatment well   Behavior During Therapy [facility] for tasks assessed/performed      Past Medical History  Diagnosis Date  . Hypertension   . Hypercholesteremia   . Back pain     arthritis  . CVA (cerebral infarction)     Past Surgical History  Procedure Laterality Date  . Appendectomy    . Tonsillectomy    . Hemorroidectomy    . Tubal ligation    . Radiology with anesthesia N/A 06/03/2014    Procedure: RADIOLOGY WITH ANESTHESIA;  Surgeon: [name], MD;  Location: [facility];  Service: Radiology;  Laterality: N/A;  . Tee without cardioversion N/A 06/07/2014    Procedure: TRANSESOPHAGEAL ECHOCARDIOGRAM (TEE);  Surgeon: [name], MD;  Location: [facility] ENDOSCOPY;  Service: Cardiovascular;  Laterality: N/A;  . Loop recorder implant  06/07/2014    MDT LINQ implanted by Dr [name] for cryptogenic stroke  . Loop recorder implant N/A 06/07/2014    Procedure: LOOP RECORDER IMPLANT;  Surgeon: [name], MD;  Location: [facility] CATH LAB;  Service: Cardiovascular;  Laterality: N/A;    There were no vitals filed for this visit.  Visit Diagnosis:  Abnormality of gait  Impaired functional mobility and activity tolerance      Subjective Assessment - 10/18/14 1448    Symptoms Having some back pain, comes and goes, not constant. Also walking more at home as well.     Pain Score 3    Pain Location Back   Pain Orientation Lower   Pain Descriptors / Indicators Sore;Aching   Pain Type Chronic pain   Pain Onset More than a month ago   Pain Frequency Intermittent   Aggravating Factors  malpositioning   Pain Relieving Factors repositioning          [facility] Adult PT Treatment/Exercise - 10/18/14 1451    Ambulation/Gait   Ambulation/Gait Yes   Ambulation/Gait Assistance 4: Min guard   Ambulation/Gait Assistance Details cues on posture, increased left step length and for increased gait speed.  used counting cadence (1,2,1,2 paced equally) and pt was able to increased gait speed with on toe scuffing noted.                                  Ambulation Distance (Feet) 220 Feet  x 2 reps   Assistive device Straight cane   Gait Pattern Step-through pattern;Decreased stride length;Poor foot clearance - left;Trunk flexed   Ambulation Surface Level;Indoor   Static Standing Balance   Static Standing - Balance Support No upper extremity supported;During functional activity   Static Standing - Level of Assistance 4: Min assist;3: Mod assist   Static Standing - Comment/# of Minutes single leg stance activities: alternating forward heel taps, cross heel taps: x 10  each bil legs to both a 4 inch box and 6 inch box   High Level Balance   High Level Balance Activities Negotitating around obstacles  Ladder: large steps with cane   High Level Balance Comments step overs: stepping over 4 bolsters of vaired heights with straight cane and up to min assist. Ladder: one foot to each square while walking with straight cane x 6 laps with min guard assist to min assist                               PT Short Term Goals - 10/06/14 1617    PT SHORT TERM GOAL #1   Title The patient will return demo HEP indep with written cues.  Target date 09/09/2014   Baseline Pt is walking, but not doing regular leg exercises.   Time 4   Period Weeks   Status On-going   PT SHORT TERM GOAL #2    Title The patient will improve Berg score from 27/56 up to 35/56 to demo decreasing risk for falls.  Target date 09/09/2014   Baseline Goal met on 10/04/2014 with patient improving to 39/56.   Time 4   Period Weeks   Status Achieved   PT SHORT TERM GOAL #3   Title The patient will improve gait speed from 1.44 f t/sec up to 1.8 ft/sec to demo decreasing risk for falls.  Target date 09/09/2014   Baseline No device=1.1 ft/sec and with SPC=   Time 4   Period Weeks   Status On-going   PT SHORT TERM GOAL #4   Title The patient will ambulate household surfaces without a device independently x 400 ft for improved independence in the home.  Target date 09/09/2014   Baseline Pt fatigues after 230 ft of gait.   Time 4   Period Weeks   Status On-going   PT SHORT TERM GOAL #5   Title The patient will move sit<>supine to the right and left sides independently.  Target date 09/09/2014   Baseline Per subjective reports from daughter that patient independent with bed mobility.   Time 4   Period Weeks   Status Achieved           PT Long Term Goals - 10/06/14 1618    PT LONG TERM GOAL #1   Title The patient will verbalize understanding of CVA risk factors/warning signs.  TArget date 10/08/2014   Baseline Reviewed on 10/04/2014   Time 8   Period Weeks   Status Achieved   PT LONG TERM GOAL #2   Title The patient will improve Berg score from 27/56 up to 40/56 to demo decreased risk for falls.  Modified target date 11/08/2014   Baseline Berg=39/56 on 10/04/2014   Time 8   Period Weeks   Status On-going   PT LONG TERM GOAL #3   Title The patient will improve SIS: mobility from 33% up to 45%.  Modified target date 11/08/2014   Time 8   Period Weeks   Status On-going   PT LONG TERM GOAL #4   Title The patient will improve gait speed from 1.44 ft/sec up to 2.3 ft/sec to demo improving functional ambulation. Modified target date 11/08/2014   Baseline Pt's gait speed is 1.07 ft/sec without a device.   Continue emphasizing walking pace.   Time 8   Period Weeks   Status On-going   PT LONG TERM GOAL #5  Title The patient will negotiate 4 steps modified indep with reciprocal pattern and one HR.   Modified target date 11/08/2014   Baseline On 10/04/2014, patient uses step to pattern with one handrail with supervision.   Time 8   Period Weeks   Status On-going   PT LONG TERM GOAL #6   Title The patient will negotiate level community surfaces without a device x 5 minutes independently for improved community mobility.  Modified target date 11/08/2014   Time 8   Period Weeks   Status On-going               Plan - 10/18/14 1450    Clinical Impression Statement Pt making steady progress toward goals. Continued to focues on increased hip/knee flexion with gait and fluidity of gait.   Pt will benefit from skilled therapeutic intervention in order to improve on the following deficits Decreased balance;Difficulty walking;Postural dysfunction;Decreased strength;Decreased endurance;Decreased activity tolerance;Decreased mobility;Pain;Abnormal gait   Rehab Potential Good   PT Frequency 2x / week   PT Duration 4 weeks   PT Treatment/Interventions Gait training;Therapeutic exercise;Patient/family education;Balance training;Stair training;Neuromuscular re-education;Therapeutic activities   PT Next Visit Plan Gait on compliant surfaces/outdoor gravel as weather permints;Continue treadmill trial with rest breaks and encouragement.  activities for left foot clearance, heel strike   PT Home Exercise Plan Issued HEP today.   Consulted and Agree with Plan of Care Patient;Family member/caregiver        Problem List Patient Active Problem List   Diagnosis Date Noted  . Spastic hemiplegia affecting nondominant side 07/25/2014  . 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 Left hemiparesis 06/10/2014  . Left-sided neglect 06/10/2014  . Thrombotic stroke involving  right middle cerebral artery 06/08/2014  . Morbid obesity- BMI 40 06/06/2014  . Acute respiratory failure with hypoxia 06/05/2014  . Cerebral infarction due to occlusion of right middle cerebral artery 06/03/2014  . Cerebral infarct 06/03/2014  . Altered mental status 06/03/2014  . GERD (gastroesophageal reflux disease) 04/12/2014  . Benign essential HTN 02/07/2014  . Dyslipidemia- statin intol 02/07/2014  . Chest pain- Low risk Myoview July 2015 02/06/2014    Willow Ora 10/18/2014, 4:32 PM  Willow Ora, PTA, [facility] Neuro [facility] [address] [place], [place] [address] [phone] 10/18/2014, 4:32 PM

## 2014-10-18 NOTE — Therapy (Signed)
[facility] [address] [place], [place], [address] Phone: [phone]   Fax:  [phone]  Occupational Therapy Treatment  Patient Details  Name: Mary Le MRN: [id number] Date of Birth: July 13, 1946 Referring Provider:  [name], MD  Encounter Date: 10/18/2014      OT End of Session - 10/18/14 1413    Visit Number 15   Date for OT Re-Evaluation 11/03/13   Authorization Type medicare - needs G code!   Authorization Time Period check STG's 11/04/14   OT Start Time 1403   OT Stop Time 1445   OT Time Calculation (min) 42 min   Activity Tolerance Patient tolerated treatment well   Behavior During Therapy [facility] for tasks assessed/performed      Past Medical History  Diagnosis Date  . Hypertension   . Hypercholesteremia   . Back pain     arthritis  . CVA (cerebral infarction)     Past Surgical History  Procedure Laterality Date  . Appendectomy    . Tonsillectomy    . Hemorroidectomy    . Tubal ligation    . Radiology with anesthesia N/A 06/03/2014    Procedure: RADIOLOGY WITH ANESTHESIA;  Surgeon: [name], MD;  Location: [facility];  Service: Radiology;  Laterality: N/A;  . Tee without cardioversion N/A 06/07/2014    Procedure: TRANSESOPHAGEAL ECHOCARDIOGRAM (TEE);  Surgeon: [name], MD;  Location: [facility] ENDOSCOPY;  Service: Cardiovascular;  Laterality: N/A;  . Loop recorder implant  06/07/2014    MDT LINQ implanted by Dr [name] for cryptogenic stroke  . Loop recorder implant N/A 06/07/2014    Procedure: LOOP RECORDER IMPLANT;  Surgeon: [name], MD;  Location: [facility] CATH LAB;  Service: Cardiovascular;  Laterality: N/A;    There were no vitals filed for this visit.  Visit Diagnosis:  Spastic hemiplegia affecting left nondominant side  Pain in joint, shoulder region, left  Impaired functional mobility and activity tolerance  Impaired cognition      Subjective Assessment -  10/18/14 1413    Symptoms Pt reports no significant pain   Pertinent History see epic snapshot;  HTN, fluctuating BP since stroke   Patient Stated Goals to use LUE   Currently in Pain? No/denies       Treatment: supine clasping LUE with RUE for trunk rotation, rolling side to side, min facillitation. Weightbearing through left elbow, and hand edge of mat mod facillitation. Weightbearing through tilted stool for elbow extension/ shoulder flexion, min/mod facillitation. Hemiglide for shoulder horizontal abduction and elbow extension,min/ mod facillitation.                       OT Short Term Goals - 10/04/14 1227    OT SHORT TERM GOAL #1   Title Pt and daughter will be I with HEP - 2/16/20126   Status Achieved   OT SHORT TERM GOAL #2   Title Pt will require a with back only for UE body bathing in shower   Status Achieved  Pt only requires assist with back   OT SHORT TERM GOAL #3   Title Pt will be close s for LB  bathing in shower   Status Achieved  Pt is supervision   OT SHORT TERM GOAL #4   Title  Pt will be min afor UB dressing with AE prn   Status Achieved  pt reports she needs a little help with long sleeve shirts.   OT SHORT TERM  GOAL #5   Title Pt will be min a for LB dressing with AE prn   Status Achieved   Additional Short Term Goals   Additional Short Term Goals Yes   OT SHORT TERM GOAL #6   Title Pt will report no more than 4/10 pain in LUE with HEP   Baseline 4-6/10   Status Partially Met   OT SHORT TERM GOAL #7   Title Pt will attend to L visual field during functional tasks with min vc's   Status Achieved   OT SHORT TERM GOAL #8   Title I with updated HEP.   Baseline ck 11/04/14   Time 4   Period Weeks   Status New           OT Long Term Goals - 10/04/14 1204    OT LONG TERM GOAL #1   Title Pt and daughter will be mod I with updated HEP prn - 10/04/2014   Status Achieved   OT LONG TERM GOAL #2   Title Pt will be supervision with  simple meal prep at ambulatory level   Status Achieved   OT LONG TERM GOAL #3   Title Pt will be mod I with toilet hygiene   Status Achieved   OT LONG TERM GOAL #4   Title Pt will be able to read 3 sentence paragraph with strategies with min vc's   Status Achieved   OT LONG TERM GOAL #5   Title Pt and daughter will be able to identify 2 strategies to compensate for memory deficit.   Status Achieved   Long Term Additional Goals   Additional Long Term Goals Yes   OT LONG TERM GOAL #6   Title Pt will report no greater than 3/10 pain in LUE with HEP   Baseline pain 4-6/10 at times   Status Not Met   OT LONG TERM GOAL #7   Title Pt will demonstrate ability to use LUE as gross stabilizer with basic self care tasks   Status Achieved   OT LONG TERM GOAL #8   Title Pt will demonstrate 45 * shoulder flexion/ scaption in prep for functional reach   Baseline 10/04/14- demonstrates grossly 35-40*   Time 8   Period Weeks   Status New   OT LONG TERM GOAL  #9   Baseline Pt will report that she is using LUE as a gross assist/ stabilizer at least 25 % of the time for ADLS   Time 8   Period Weeks   Status New               Plan - 10/18/14 1650    Clinical Impression Statement Pt is progressing towards goals with improving elbow A/ROM extension.   Rehab Potential Good   Clinical Impairments Affecting Rehab Potential pain, spasticity   OT Frequency 2x / week   OT Duration 8 weeks   OT Treatment/Interventions Self-care/ADL training;Ultrasound;Moist Heat;Fluidtherapy;Therapeutic exercise;Neuromuscular education;Energy conservation;Manual Therapy;Functional Mobility Training;DME and/or AE instruction;Passive range of motion;Visual/perceptual remediation/compensation;Cognitive remediation/compensation;Therapeutic activities;Splinting;Patient/family education;Balance training   Plan neuro re-ed   OT Home Exercise Plan supine LUE sidelying, and trunk rotation- issued 10/06/14   Consulted and  Agree with Plan of Care Patient        Problem List Patient Active Problem List   Diagnosis Date Noted  . Spastic hemiplegia affecting nondominant side 07/25/2014  . 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 Left hemiparesis 06/10/2014  . Left-sided neglect 06/10/2014  . Thrombotic  stroke involving right middle cerebral artery 06/08/2014  . Morbid obesity- BMI 40 06/06/2014  . Acute respiratory failure with hypoxia 06/05/2014  . Cerebral infarction due to occlusion of right middle cerebral artery 06/03/2014  . Cerebral infarct 06/03/2014  . Altered mental status 06/03/2014  . GERD (gastroesophageal reflux disease) 04/12/2014  . Benign essential HTN 02/07/2014  . Dyslipidemia- statin intol 02/07/2014  . Chest pain- Low risk Myoview July 2015 02/06/2014    RINE,KATHRYN 10/18/2014, 4:52 PM Theone Murdoch, OTR/L [phone] Phone: [phone] 4:52 PM 10/18/2014 [facility] [address] [place], [place], [address] Phone: [phone]   Fax:  [phone]

## 2014-10-19 ENCOUNTER — Ambulatory Visit: Payer: Medicare Other

## 2014-10-19 ENCOUNTER — Ambulatory Visit: Payer: Medicare Other | Admitting: Occupational Therapy

## 2014-10-19 DIAGNOSIS — Z7409 Other reduced mobility: Secondary | ICD-10-CM | POA: Diagnosis not present

## 2014-10-19 DIAGNOSIS — M25512 Pain in left shoulder: Secondary | ICD-10-CM

## 2014-10-19 DIAGNOSIS — H539 Unspecified visual disturbance: Secondary | ICD-10-CM | POA: Diagnosis not present

## 2014-10-19 DIAGNOSIS — G8114 Spastic hemiplegia affecting left nondominant side: Secondary | ICD-10-CM | POA: Diagnosis not present

## 2014-10-19 DIAGNOSIS — R4189 Other symptoms and signs involving cognitive functions and awareness: Secondary | ICD-10-CM | POA: Diagnosis not present

## 2014-10-19 DIAGNOSIS — R269 Unspecified abnormalities of gait and mobility: Secondary | ICD-10-CM | POA: Diagnosis not present

## 2014-10-19 NOTE — Therapy (Signed)
[facility] [address] [place], [place], [address] Phone: [phone]   Fax:  [phone]  Occupational Therapy Treatment  Patient Details  Name: Mary Le MRN: [id number] Date of Birth: [birth year] Referring Provider:  [name], MD  Encounter Date: 10/19/2014      OT End of Session - 10/19/14 1632    Visit Number 16   Number of Visits 24   Date for OT Re-Evaluation 11/03/13   Authorization Type medicare - needs G code!   Authorization Time Period check STG's 11/04/14   Authorization - Visit Number 79   Authorization - Number of Visits 20   OT Start Time 1450   OT Stop Time 1530   OT Time Calculation (min) 40 min   Activity Tolerance Patient tolerated treatment well   Behavior During Therapy WFL for tasks assessed/performed      Past Medical History  Diagnosis Date  . Hypertension   . Hypercholesteremia   . Back pain     arthritis  . CVA (cerebral infarction)     Past Surgical History  Procedure Laterality Date  . Appendectomy    . Tonsillectomy    . Hemorroidectomy    . Tubal ligation    . Radiology with anesthesia N/A 06/03/2014    Procedure: RADIOLOGY WITH ANESTHESIA;  Surgeon: [name], MD;  Location: [facility];  Service: Radiology;  Laterality: N/A;  . Tee without cardioversion N/A 06/07/2014    Procedure: TRANSESOPHAGEAL ECHOCARDIOGRAM (TEE);  Surgeon: [name], MD;  Location: [facility] ENDOSCOPY;  Service: Cardiovascular;  Laterality: N/A;  . Loop recorder implant  06/07/2014    MDT LINQ implanted by Dr [name] for cryptogenic stroke  . Loop recorder implant N/A 06/07/2014    Procedure: LOOP RECORDER IMPLANT;  Surgeon: [name], MD;  Location: [facility] CATH LAB;  Service: Cardiovascular;  Laterality: N/A;    There were no vitals filed for this visit.  Visit Diagnosis:  Spastic hemiplegia affecting left nondominant side  Pain in joint, shoulder region, left  Impaired  functional mobility and activity tolerance  Impaired cognition      Subjective Assessment - 10/19/14 1523    Pertinent History see epic snapshot;  HTN, fluctuating BP since stroke   Patient Stated Goals to use LUE   Currently in Pain? Yes   Pain Score 4    Pain Location Foot   Pain Orientation Left   Pain Type Chronic pain   Pain Onset More than a month ago   Pain Frequency Intermittent   Aggravating Factors  unknown   Pain Relieving Factors soaking foot in epsom salts   Multiple Pain Sites No      Treatment: Supine pt performed rolling side to side while performing gentle shoulder flexion/ abduction self ROM, min facillitation/ v.c. Sidelying, UE Ranger for shoulder flexion, circumduction, elbow extension , with therapist facillitating scapula, shoulder and hand. Seated gentle weightbearing with LUE and P/ROM wrist flexion extension. Pt demonstrates decreased spasticity and incr. Shoulder flexion at end of session.                        OT Short Term Goals - 10/04/14 1227    OT SHORT TERM GOAL #1   Title Pt and daughter will be I with HEP - 2/16/20126   Status Achieved   OT SHORT TERM GOAL #2   Title Pt will require a with back only for UE body bathing in shower  Status Achieved  Pt only requires assist with back   OT SHORT TERM GOAL #3   Title Pt will be close s for LB  bathing in shower   Status Achieved  Pt is supervision   OT SHORT TERM GOAL #4   Title  Pt will be min afor UB dressing with AE prn   Status Achieved  pt reports she needs a little help with long sleeve shirts.   OT SHORT TERM GOAL #5   Title Pt will be min a for LB dressing with AE prn   Status Achieved   Additional Short Term Goals   Additional Short Term Goals Yes   OT SHORT TERM GOAL #6   Title Pt will report no more than 4/10 pain in LUE with HEP   Baseline 4-6/10   Status Partially Met   OT SHORT TERM GOAL #7   Title Pt will attend to L visual field during functional  tasks with min vc's   Status Achieved   OT SHORT TERM GOAL #8   Title I with updated HEP.   Baseline ck 11/04/14   Time 4   Period Weeks   Status New           OT Long Term Goals - 10/04/14 1204    OT LONG TERM GOAL #1   Title Pt and daughter will be mod I with updated HEP prn - 10/04/2014   Status Achieved   OT LONG TERM GOAL #2   Title Pt will be supervision with simple meal prep at ambulatory level   Status Achieved   OT LONG TERM GOAL #3   Title Pt will be mod I with toilet hygiene   Status Achieved   OT LONG TERM GOAL #4   Title Pt will be able to read 3 sentence paragraph with strategies with min vc's   Status Achieved   OT LONG TERM GOAL #5   Title Pt and daughter will be able to identify 2 strategies to compensate for memory deficit.   Status Achieved   Long Term Additional Goals   Additional Long Term Goals Yes   OT LONG TERM GOAL #6   Title Pt will report no greater than 3/10 pain in LUE with HEP   Baseline pain 4-6/10 at times   Status Not Met   OT LONG TERM GOAL #7   Title Pt will demonstrate ability to use LUE as gross stabilizer with basic self care tasks   Status Achieved   OT LONG TERM GOAL #8   Title Pt will demonstrate 45 * shoulder flexion/ scaption in prep for functional reach   Baseline 10/04/14- demonstrates grossly 35-40*   Time 8   Period Weeks   Status New   OT LONG TERM GOAL  #9   Baseline Pt will report that she is using LUE as a gross assist/ stabilizer at least 25 % of the time for ADLS   Time 8   Period Weeks   Status New               Plan - 10/19/14 1635    Clinical Impression Statement Pt is progressing towards goals with improving shoulder and elbow A/ROM.   Rehab Potential Good   Clinical Impairments Affecting Rehab Potential pain, spasticity   Plan neuro re-ed   OT Home Exercise Plan supine LUE sidelying, and trunk rotation- issued 10/06/14   Consulted and Agree with Plan of Care Patient  Problem  List Patient Active Problem List   Diagnosis Date Noted  . Spastic hemiplegia affecting nondominant side 07/25/2014  . 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 Left hemiparesis 06/10/2014  . Left-sided neglect 06/10/2014  . 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 Morbid obesity- BMI 40 06/06/2014  . Acute respiratory failure with hypoxia 06/05/2014  . Cerebral infarction due to occlusion of right middle cerebral artery 06/03/2014  . Cerebral infarct 06/03/2014  . Altered mental status 06/03/2014  . GERD (gastroesophageal reflux disease) 04/12/2014  . Benign essential HTN 02/07/2014  . Dyslipidemia- statin intol 02/07/2014  . Chest pain- Low risk Myoview July 2015 02/06/2014    Hank Walling 10/19/2014, 4:37 PM Theone Murdoch, OTR/L [phone] Phone: [phone] 4:37 PM 10/19/2014  [facility] [address] [place], [place], [address] Phone: [phone]   Fax:  [phone]

## 2014-10-24 ENCOUNTER — Other Ambulatory Visit: Payer: Self-pay | Admitting: Neurology

## 2014-10-25 ENCOUNTER — Ambulatory Visit: Payer: Medicare Other | Attending: Physical Medicine & Rehabilitation | Admitting: Physical Therapy

## 2014-10-25 ENCOUNTER — Encounter: Payer: Self-pay | Admitting: Physical Therapy

## 2014-10-25 ENCOUNTER — Ambulatory Visit: Payer: Medicare Other | Admitting: Occupational Therapy

## 2014-10-25 DIAGNOSIS — Z7409 Other reduced mobility: Secondary | ICD-10-CM

## 2014-10-25 DIAGNOSIS — M25512 Pain in left shoulder: Secondary | ICD-10-CM

## 2014-10-25 DIAGNOSIS — G8114 Spastic hemiplegia affecting left nondominant side: Secondary | ICD-10-CM | POA: Diagnosis not present

## 2014-10-25 DIAGNOSIS — R4189 Other symptoms and signs involving cognitive functions and awareness: Secondary | ICD-10-CM | POA: Diagnosis not present

## 2014-10-25 DIAGNOSIS — R269 Unspecified abnormalities of gait and mobility: Secondary | ICD-10-CM | POA: Diagnosis not present

## 2014-10-25 DIAGNOSIS — H539 Unspecified visual disturbance: Secondary | ICD-10-CM | POA: Diagnosis not present

## 2014-10-26 NOTE — Therapy (Signed)
[facility] [address] [place], [place], [address] Phone: [phone]   Fax:  [phone]  Occupational Therapy Treatment  Patient Details  Name: Mary Le MRN: [id number] Date of Birth: 1946/03/17 Referring Provider:  [name], MD  Encounter Date: 10/25/2014      OT End of Session - 10/26/14 0924    Visit Number 17   Number of Visits 24   Date for OT Re-Evaluation 11/03/13   Authorization Type medicare - needs G code!   Authorization Time Period check STG's 11/04/14   Authorization - Visit Number 11   Authorization - Number of Visits 20   OT Start Time 1450   OT Stop Time 1530   OT Time Calculation (min) 40 min   Activity Tolerance Patient tolerated treatment well   Behavior During Therapy WFL for tasks assessed/performed      Past Medical History  Diagnosis Date  . Hypertension   . Hypercholesteremia   . Back pain     arthritis  . CVA (cerebral infarction)     Past Surgical History  Procedure Laterality Date  . Appendectomy    . Tonsillectomy    . Hemorroidectomy    . Tubal ligation    . Radiology with anesthesia N/A 06/03/2014    Procedure: RADIOLOGY WITH ANESTHESIA;  Surgeon: [name], MD;  Location: [facility];  Service: Radiology;  Laterality: N/A;  . Tee without cardioversion N/A 06/07/2014    Procedure: TRANSESOPHAGEAL ECHOCARDIOGRAM (TEE);  Surgeon: [name], MD;  Location: [facility] ENDOSCOPY;  Service: Cardiovascular;  Laterality: N/A;  . Loop recorder implant  06/07/2014    MDT LINQ implanted by Dr [name] for cryptogenic stroke  . Loop recorder implant N/A 06/07/2014    Procedure: LOOP RECORDER IMPLANT;  Surgeon: [name], MD;  Location: [facility] CATH LAB;  Service: Cardiovascular;  Laterality: N/A;    There were no vitals filed for this visit.  Visit Diagnosis:  Pain in joint, shoulder region, left  Impaired functional mobility and activity tolerance  Impaired  cognition  Spastic hemiplegia affecting left nondominant side      Subjective Assessment - 10/25/14 1507    Subjective  Pt reports more arm/ hand pain today   Pertinent History see epic snapshot;  HTN, fluctuating BP since stroke   Patient Stated Goals to use LUE   Currently in Pain? Yes   Pain Score 4    Pain Location Arm   Pain Orientation Left   Pain Descriptors / Indicators Aching   Pain Type Chronic pain   Pain Onset More than a month ago   Aggravating Factors  malpositioning   Pain Relieving Factors repositioning   Multiple Pain Sites No      Treatment: Neuromuscular re-ed for pt self ROM shoulder flexion and trunk rotation followed by shoulder flexion and elbow extension with UE Ranger in sidelying, min facillitation. Pt with significant pain and stiffness today, Hotpack applied at end of session x 10 mins, with pt. reporting decreased pain.                        OT Short Term Goals - 10/04/14 1227    OT SHORT TERM GOAL #1   Title Pt and daughter will be I with HEP - 2/16/20126   Status Achieved   OT SHORT TERM GOAL #2   Title Pt will require a with back only for UE body bathing in shower  Status Achieved  Pt only requires assist with back   OT SHORT TERM GOAL #3   Title Pt will be close s for LB  bathing in shower   Status Achieved  Pt is supervision   OT SHORT TERM GOAL #4   Title  Pt will be min afor UB dressing with AE prn   Status Achieved  pt reports she needs a little help with long sleeve shirts.   OT SHORT TERM GOAL #5   Title Pt will be min a for LB dressing with AE prn   Status Achieved   Additional Short Term Goals   Additional Short Term Goals Yes   OT SHORT TERM GOAL #6   Title Pt will report no more than 4/10 pain in LUE with HEP   Baseline 4-6/10   Status Partially Met   OT SHORT TERM GOAL #7   Title Pt will attend to L visual field during functional tasks with min vc's   Status Achieved   OT SHORT TERM GOAL #8    Title I with updated HEP.   Baseline ck 11/04/14   Time 4   Period Weeks   Status New           OT Long Term Goals - 10/04/14 1204    OT LONG TERM GOAL #1   Title Pt and daughter will be mod I with updated HEP prn - 10/04/2014   Status Achieved   OT LONG TERM GOAL #2   Title Pt will be supervision with simple meal prep at ambulatory level   Status Achieved   OT LONG TERM GOAL #3   Title Pt will be mod I with toilet hygiene   Status Achieved   OT LONG TERM GOAL #4   Title Pt will be able to read 3 sentence paragraph with strategies with min vc's   Status Achieved   OT LONG TERM GOAL #5   Title Pt and daughter will be able to identify 2 strategies to compensate for memory deficit.   Status Achieved   Long Term Additional Goals   Additional Long Term Goals Yes   OT LONG TERM GOAL #6   Title Pt will report no greater than 3/10 pain in LUE with HEP   Baseline pain 4-6/10 at times   Status Not Met   OT LONG TERM GOAL #7   Title Pt will demonstrate ability to use LUE as gross stabilizer with basic self care tasks   Status Achieved   OT LONG TERM GOAL #8   Title Pt will demonstrate 45 * shoulder flexion/ scaption in prep for functional reach   Baseline 10/04/14- demonstrates grossly 35-40*   Time 8   Period Weeks   Status New   OT LONG TERM GOAL  #9   Baseline Pt will report that she is using LUE as a gross assist/ stabilizer at least 25 % of the time for ADLS   Time 8   Period Weeks   Status New               Plan - 10/25/14 1509    Clinical Impression Statement Pt is progressing towards goals yet was limited by pain/ stiffness.   Rehab Potential Good   Clinical Impairments Affecting Rehab Potential pain, spasticity   Plan neuro re-ed   OT Home Exercise Plan supine LUE sidelying, and trunk rotation- issued 10/06/14   Consulted and Agree with Plan of Care Patient  Problem List Patient Active Problem List   Diagnosis Date Noted  . Spastic hemiplegia  affecting nondominant side 07/25/2014  . 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 Left hemiparesis 06/10/2014  . Left-sided neglect 06/10/2014  . 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 Morbid obesity- BMI 40 06/06/2014  . Acute respiratory failure with hypoxia 06/05/2014  . Cerebral infarction due to occlusion of right middle cerebral artery 06/03/2014  . Cerebral infarct 06/03/2014  . Altered mental status 06/03/2014  . GERD (gastroesophageal reflux disease) 04/12/2014  . Benign essential HTN 02/07/2014  . Dyslipidemia- statin intol 02/07/2014  . Chest pain- Low risk Myoview July 2015 02/06/2014    [facility] [place] 10/26/2014, 9:27 AM Theone Murdoch, OTR/L [phone] Phone: [phone] 9:27 AM 10/26/2014 [facility] [address] [place], [place], [address] Phone: [phone]   Fax:  [phone]

## 2014-10-26 NOTE — Therapy (Signed)
[facility] [address] [place], [place], [address] Phone: [phone]   Fax:  [phone]  Physical Therapy Treatment  Patient Details  Name: Mary Le MRN: [id number] Date of Birth: [birth year] Referring Provider:  [name], MD  Encounter Date: 10/25/2014      PT End of Session - 10/25/14 1407    Visit Number 15  G5   Number of Visits 24   Date for PT Re-Evaluation 11/08/14   PT Start Time 2694   PT Stop Time 1445   PT Time Calculation (min) 42 min   Equipment Utilized During Treatment Gait belt   Activity Tolerance Patient tolerated treatment well   Behavior During Therapy [facility] for tasks assessed/performed      Past Medical History  Diagnosis Date  . Hypertension   . Hypercholesteremia   . Back pain     arthritis  . CVA (cerebral infarction)     Past Surgical History  Procedure Laterality Date  . Appendectomy    . Tonsillectomy    . Hemorroidectomy    . Tubal ligation    . Radiology with anesthesia N/A 06/03/2014    Procedure: RADIOLOGY WITH ANESTHESIA;  Surgeon: [name], MD;  Location: [facility];  Service: Radiology;  Laterality: N/A;  . Tee without cardioversion N/A 06/07/2014    Procedure: TRANSESOPHAGEAL ECHOCARDIOGRAM (TEE);  Surgeon: [name], MD;  Location: [facility] ENDOSCOPY;  Service: Cardiovascular;  Laterality: N/A;  . Loop recorder implant  06/07/2014    MDT LINQ implanted by Dr [name] for cryptogenic stroke  . Loop recorder implant N/A 06/07/2014    Procedure: LOOP RECORDER IMPLANT;  Surgeon: [name], MD;  Location: [facility] CATH LAB;  Service: Cardiovascular;  Laterality: N/A;    There were no vitals filed for this visit.  Visit Diagnosis:  Abnormality of gait  Impaired functional mobility and activity tolerance      Subjective Assessment - 10/25/14 1405    Pain Score 3    Pain Location Foot   Pain Orientation Left   Pain Descriptors / Indicators  Sore;Aching   Pain Type Chronic pain   Pain Onset In the past 7 days   Pain Frequency Intermittent   Aggravating Factors  unknown   Pain Relieving Factors medication, soaking in epsom salts/warm water           [facility] Adult PT Treatment/Exercise - 10/25/14 1412    Ambulation/Gait   Ambulation/Gait Yes   Ambulation/Gait Assistance 4: Min guard   Ambulation Distance (Feet) 230 Feet  115 feet   Assistive device None   Gait Pattern Step-through pattern;Decreased stride length;Poor foot clearance - left;Trunk flexed   Ambulation Surface Level;Indoor   Static Standing Balance   Static Standing - Balance Support No upper extremity supported;During functional activity   Static Standing - Level of Assistance 4: Min assist;3: Mod assist   Static Standing - Comment/# of Minutes with tall cones: alternating forward toe taps x 6-7 each side with up to mod assist and cues on weight shift, stance position and increased hip/knee flexion with left leg lifting. with 4 inch [id number] heel taps forward and cross ways x 10 each bil sides with up to min assist and cues as previously stated; with 6 inch box: heel taps as with 4 inch box, min assist with increased difficulty with left heel taps due to decreased range in hip/knee flexion.  High Level Balance   High Level Balance Activities Negotiating over obstacles   High Level Balance Comments step overs: stepping over 4 bolsters with straight cane and up to min assist, mostley min guard assist.                              PT Short Term Goals - 10/06/14 1617    PT SHORT TERM GOAL #1   Title The patient will return demo HEP indep with written cues.  Target date 09/09/2014   Baseline Pt is walking, but not doing regular leg exercises.   Time 4   Period Weeks   Status On-going   PT SHORT TERM GOAL #2   Title The patient will improve Berg score from 27/56 up to 35/56 to demo decreasing risk for falls.  Target date  09/09/2014   Baseline Goal met on 10/04/2014 with patient improving to 39/56.   Time 4   Period Weeks   Status Achieved   PT SHORT TERM GOAL #3   Title The patient will improve gait speed from 1.44 f t/sec up to 1.8 ft/sec to demo decreasing risk for falls.  Target date 09/09/2014   Baseline No device=1.1 ft/sec and with SPC=   Time 4   Period Weeks   Status On-going   PT SHORT TERM GOAL #4   Title The patient will ambulate household surfaces without a device independently x 400 ft for improved independence in the home.  Target date 09/09/2014   Baseline Pt fatigues after 230 ft of gait.   Time 4   Period Weeks   Status On-going   PT SHORT TERM GOAL #5   Title The patient will move sit<>supine to the right and left sides independently.  Target date 09/09/2014   Baseline Per subjective reports from daughter that patient independent with bed mobility.   Time 4   Period Weeks   Status Achieved           PT Long Term Goals - 10/06/14 1618    PT LONG TERM GOAL #1   Title The patient will verbalize understanding of CVA risk factors/warning signs.  TArget date 10/08/2014   Baseline Reviewed on 10/04/2014   Time 8   Period Weeks   Status Achieved   PT LONG TERM GOAL #2   Title The patient will improve Berg score from 27/56 up to 40/56 to demo decreased risk for falls.  Modified target date 11/08/2014   Baseline Berg=39/56 on 10/04/2014   Time 8   Period Weeks   Status On-going   PT LONG TERM GOAL #3   Title The patient will improve SIS: mobility from 33% up to 45%.  Modified target date 11/08/2014   Time 8   Period Weeks   Status On-going   PT LONG TERM GOAL #4   Title The patient will improve gait speed from 1.44 ft/sec up to 2.3 ft/sec to demo improving functional ambulation. Modified target date 11/08/2014   Baseline Pt's gait speed is 1.07 ft/sec without a device.  Continue emphasizing walking pace.   Time 8   Period Weeks   Status On-going   PT LONG TERM GOAL #5   Title The  patient will negotiate 4 steps modified indep with reciprocal pattern and one HR.   Modified target date 11/08/2014   Baseline On 10/04/2014, patient uses step to pattern with one handrail with supervision.   Time 8  Period Weeks   Status On-going   PT LONG TERM GOAL #6   Title The patient will negotiate level community surfaces without a device x 5 minutes independently for improved community mobility.  Modified target date 11/08/2014   Time 8   Period Weeks   Status On-going               Plan - 10/25/14 1408    Clinical Impression Statement Pt continues to make progress toward goals. No increase in foot pain was reported with todays activities.   Pt will benefit from skilled therapeutic intervention in order to improve on the following deficits Decreased balance;Difficulty walking;Postural dysfunction;Decreased strength;Decreased endurance;Decreased activity tolerance;Decreased mobility;Pain;Abnormal gait   Rehab Potential Good   PT Frequency 2x / week   PT Duration 4 weeks   PT Treatment/Interventions Gait training;Therapeutic exercise;Patient/family education;Balance training;Stair training;Neuromuscular re-education;Therapeutic activities   PT Next Visit Plan Gait on compliant surfaces/outdoor gravel as weather permints;Continue treadmill trial with rest breaks and encouragement.  activities for left foot clearance, heel strike   PT Home Exercise Plan Issued HEP today.   Consulted and Agree with Plan of Care Patient;Family member/caregiver        Problem List Patient Active Problem List   Diagnosis Date Noted  . Spastic hemiplegia affecting nondominant side 07/25/2014  . 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 Left hemiparesis 06/10/2014  . Left-sided neglect 06/10/2014  . 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 Morbid obesity- BMI 40 06/06/2014  . Acute respiratory failure with hypoxia 06/05/2014   . Cerebral infarction due to occlusion of right middle cerebral artery 06/03/2014  . Cerebral infarct 06/03/2014  . Altered mental status 06/03/2014  . GERD (gastroesophageal reflux disease) 04/12/2014  . Benign essential HTN 02/07/2014  . Dyslipidemia- statin intol 02/07/2014  . Chest pain- Low risk Myoview July 2015 02/06/2014    Willow Ora 10/26/2014, 11:52 AM  Willow Ora, PTA, [facility] [address], [place], [place] [address] [phone] 10/26/2014, 11:52 AM

## 2014-10-28 ENCOUNTER — Ambulatory Visit: Payer: Medicare Other | Admitting: Occupational Therapy

## 2014-10-28 ENCOUNTER — Encounter: Payer: Medicare Other | Attending: Physical Medicine & Rehabilitation

## 2014-10-28 ENCOUNTER — Encounter: Payer: Self-pay | Admitting: Internal Medicine

## 2014-10-28 ENCOUNTER — Encounter: Payer: Self-pay | Admitting: Physical Medicine & Rehabilitation

## 2014-10-28 ENCOUNTER — Ambulatory Visit: Payer: Medicare Other | Admitting: Physical Therapy

## 2014-10-28 ENCOUNTER — Encounter: Payer: Self-pay | Admitting: Physical Therapy

## 2014-10-28 ENCOUNTER — Ambulatory Visit (HOSPITAL_BASED_OUTPATIENT_CLINIC_OR_DEPARTMENT_OTHER): Payer: Medicare Other | Admitting: Physical Medicine & Rehabilitation

## 2014-10-28 VITALS — BP 182/95 | HR 83 | Resp 14

## 2014-10-28 DIAGNOSIS — R414 Neurologic neglect syndrome: Secondary | ICD-10-CM | POA: Insufficient documentation

## 2014-10-28 DIAGNOSIS — M7502 Adhesive capsulitis of left shoulder: Secondary | ICD-10-CM | POA: Diagnosis not present

## 2014-10-28 DIAGNOSIS — G8114 Spastic hemiplegia affecting left nondominant side: Secondary | ICD-10-CM | POA: Diagnosis not present

## 2014-10-28 DIAGNOSIS — G8110 Spastic hemiplegia affecting unspecified side: Secondary | ICD-10-CM

## 2014-10-28 DIAGNOSIS — Z7409 Other reduced mobility: Secondary | ICD-10-CM

## 2014-10-28 DIAGNOSIS — I63311 Cerebral infarction due to thrombosis of right middle cerebral artery: Secondary | ICD-10-CM

## 2014-10-28 DIAGNOSIS — R4189 Other symptoms and signs involving cognitive functions and awareness: Secondary | ICD-10-CM | POA: Diagnosis not present

## 2014-10-28 DIAGNOSIS — R269 Unspecified abnormalities of gait and mobility: Secondary | ICD-10-CM

## 2014-10-28 DIAGNOSIS — I639 Cerebral infarction, unspecified: Secondary | ICD-10-CM

## 2014-10-28 DIAGNOSIS — H539 Unspecified visual disturbance: Secondary | ICD-10-CM | POA: Diagnosis not present

## 2014-10-28 DIAGNOSIS — M25512 Pain in left shoulder: Secondary | ICD-10-CM | POA: Diagnosis not present

## 2014-10-28 NOTE — Therapy (Signed)
[facility] [address] [place], [place], [address] Phone: [phone]   Fax:  [phone]  Occupational Therapy Treatment  Patient Details  Name: Mary Le MRN: [id number] Date of Birth: [birth year] Referring Provider:  [name], MD  Encounter Date: 10/28/2014      OT End of Session - 10/28/14 1736    Visit Number 18   Number of Visits 24   Date for OT Re-Evaluation 11/03/13   Authorization Type medicare - needs G code!   Authorization Time Period check goals 11/04/14   Authorization - Visit Number 18   Authorization - Number of Visits 20   OT Start Time 5009   OT Stop Time 1446   OT Time Calculation (min) 38 min   Activity Tolerance Patient tolerated treatment well   Behavior During Therapy WFL for tasks assessed/performed      Past Medical History  Diagnosis Date  . Hypertension   . Hypercholesteremia   . Back pain     arthritis  . CVA (cerebral infarction)     Past Surgical History  Procedure Laterality Date  . Appendectomy    . Tonsillectomy    . Hemorroidectomy    . Tubal ligation    . Radiology with anesthesia N/A 06/03/2014    Procedure: RADIOLOGY WITH ANESTHESIA;  Surgeon: [name], MD;  Location: [facility];  Service: Radiology;  Laterality: N/A;  . Tee without cardioversion N/A 06/07/2014    Procedure: TRANSESOPHAGEAL ECHOCARDIOGRAM (TEE);  Surgeon: [name], MD;  Location: [facility] ENDOSCOPY;  Service: Cardiovascular;  Laterality: N/A;  . Loop recorder implant  06/07/2014    MDT LINQ implanted by Dr [name] for cryptogenic stroke  . Loop recorder implant N/A 06/07/2014    Procedure: LOOP RECORDER IMPLANT;  Surgeon: [name], MD;  Location: [facility] CATH LAB;  Service: Cardiovascular;  Laterality: N/A;    There were no vitals filed for this visit.  Visit Diagnosis:  Pain in joint, shoulder region, left  Impaired cognition  Spastic hemiplegia affecting left nondominant  side      Subjective Assessment - 10/28/14 1433    Subjective  Pt reports she saw Dr. Letta Pate and he is going to do botox in May.   Pertinent History see epic snapshot;  HTN, fluctuating BP since stroke   Patient Stated Goals to use LUE   Currently in Pain? Yes   Pain Score 4    Pain Location Arm   Pain Type Chronic pain   Aggravating Factors  malpositioning   Pain Relieving Factors repositioning        Treatment: [place] applied to LUE x 10 mins due to pain and stiffness, mild pink coloration, however no adverse reaction. Pt reports she saw Dr. Dianna Limbo and he plans to do botox in May. Will likely d/c in next several week and re-eval after botox. Neuro re-ed in supine and rolling to each side, for self ROM shoulder flexion / elbow extension. Gentle passive stretch elbow extension and finger extension, AA/ROM shoulder flexion/ elbow extension.                      OT Short Term Goals - 10/04/14 1227    OT SHORT TERM GOAL #1   Title Pt and daughter will be I with HEP - 2/16/20126   Status Achieved   OT SHORT TERM GOAL #2   Title Pt will require a with back only for UE body bathing in shower  Status Achieved  Pt only requires assist with back   OT SHORT TERM GOAL #3   Title Pt will be close s for LB  bathing in shower   Status Achieved  Pt is supervision   OT SHORT TERM GOAL #4   Title  Pt will be min afor UB dressing with AE prn   Status Achieved  pt reports she needs a little help with long sleeve shirts.   OT SHORT TERM GOAL #5   Title Pt will be min a for LB dressing with AE prn   Status Achieved   Additional Short Term Goals   Additional Short Term Goals Yes   OT SHORT TERM GOAL #6   Title Pt will report no more than 4/10 pain in LUE with HEP   Baseline 4-6/10   Status Partially Met   OT SHORT TERM GOAL #7   Title Pt will attend to L visual field during functional tasks with min vc's   Status Achieved   OT SHORT TERM GOAL #8   Title I with  updated HEP.   Baseline ck 11/04/14   Time 4   Period Weeks   Status New           OT Long Term Goals - 10/04/14 1204    OT LONG TERM GOAL #1   Title Pt and daughter will be mod I with updated HEP prn - 10/04/2014   Status Achieved   OT LONG TERM GOAL #2   Title Pt will be supervision with simple meal prep at ambulatory level   Status Achieved   OT LONG TERM GOAL #3   Title Pt will be mod I with toilet hygiene   Status Achieved   OT LONG TERM GOAL #4   Title Pt will be able to read 3 sentence paragraph with strategies with min vc's   Status Achieved   OT LONG TERM GOAL #5   Title Pt and daughter will be able to identify 2 strategies to compensate for memory deficit.   Status Achieved   Long Term Additional Goals   Additional Long Term Goals Yes   OT LONG TERM GOAL #6   Title Pt will report no greater than 3/10 pain in LUE with HEP   Baseline pain 4-6/10 at times   Status Not Met   OT LONG TERM GOAL #7   Title Pt will demonstrate ability to use LUE as gross stabilizer with basic self care tasks   Status Achieved   OT LONG TERM GOAL #8   Title Pt will demonstrate 45 * shoulder flexion/ scaption in prep for functional reach   Baseline 10/04/14- demonstrates grossly 35-40*   Time 8   Period Weeks   Status New   OT LONG TERM GOAL  #9   Baseline Pt will report that she is using LUE as a gross assist/ stabilizer at least 25 % of the time for ADLS   Time 8   Period Weeks   Status New               Plan - 10/28/14 1435    Clinical Impression Statement Pt is progressing slowly limited by pain/ stiffness.   Plan neuro re-ed   OT Home Exercise Plan supine LUE sidelying, and trunk rotation- issued 10/06/14   Consulted and Agree with Plan of Care Patient        Problem List Patient Active Problem List   Diagnosis Date Noted  . Spastic hemiplegia affecting nondominant  side 07/25/2014  . 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  Left hemiparesis 06/10/2014  . Left-sided neglect 06/10/2014  . 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 Morbid obesity- BMI 40 06/06/2014  . Acute respiratory failure with hypoxia 06/05/2014  . Cerebral infarction due to occlusion of right middle cerebral artery 06/03/2014  . Cerebral infarct 06/03/2014  . Altered mental status 06/03/2014  . GERD (gastroesophageal reflux disease) 04/12/2014  . Benign essential HTN 02/07/2014  . Dyslipidemia- statin intol 02/07/2014  . Chest pain- Low risk Myoview July 2015 02/06/2014    Naithan Delage 10/28/2014, 5:38 PM Theone Murdoch, OTR/L [phone] Phone: [phone] 5:38 PM 10/28/2014 [facility] [address] [place], [place], [address] Phone: [phone]   Fax:  [phone]

## 2014-10-28 NOTE — Progress Notes (Signed)
Subjective:    Patient ID: Mary Le, female    DOB: 1946-02-10, 69 y.o.   MRN: [id number]  HPI On 2/1 Pectoralis100  Biceps100  FDS 50  FDP 50  Improved left shoulder range of motion, improved elbow extension, decreased clenched fist, still has little return in the finger flexors and extensors. Does have increased strength in the left deltoid  Pain Inventory Average Pain 4 Pain Right Now 4 My pain is burning  In the last 24 hours, has pain interfered with the following? General activity 4 Relation with others 4 Enjoyment of life 4 What TIME of day is your pain at its worst? morning Sleep (in general) Good  Pain is worse with: inactivity Pain improves with: heat/ice and medication Relief from Meds: 9  Mobility walk with assistance use a cane how many minutes can you walk? 10 ability to climb steps?  yes do you drive?  no use a wheelchair needs help with transfers  Function not employed: date last employed . I need assistance with the following:  dressing, bathing, toileting, meal prep, household duties and shopping  Neuro/Psych weakness numbness tremor tingling trouble walking anxiety  Prior Studies Any changes since last visit?  no  Physicians involved in your care Any changes since last visit?  no   Family History  Problem Relation Age of Onset  . Coronary artery disease Mother 53    MI  . Kidney disease Father    History   Social History  . Marital Status: Married    Spouse Name: N/A  . Number of Children: N/A  . Years of Education: N/A   Social History Main Topics  . Smoking status: Former Smoker -- 5 years  . Smokeless tobacco: Never Used  . Alcohol Use: No  . Drug Use: No  . Sexual Activity: Not on file   Other Topics Concern  . None   Social History Narrative   [place]ives in [place]    Married. Education: [employer].    Past Surgical History  Procedure Laterality Date  . Appendectomy    . Tonsillectomy    . Hemorroidectomy     . Tubal ligation    . Radiology with anesthesia N/A 06/03/2014    Procedure: RADIOLOGY WITH ANESTHESIA;  Surgeon: [name], MD;  Location: [facility];  Service: Radiology;  Laterality: N/A;  . Tee without cardioversion N/A 06/07/2014    Procedure: TRANSESOPHAGEAL ECHOCARDIOGRAM (TEE);  Surgeon: [name], MD;  Location: [facility] ENDOSCOPY;  Service: Cardiovascular;  Laterality: N/A;  . Loop recorder implant  06/07/2014    MDT LINQ implanted by Dr [name] for cryptogenic stroke  . Loop recorder implant N/A 06/07/2014    Procedure: LOOP RECORDER IMPLANT;  Surgeon: [name], MD;  Location: [facility] CATH LAB;  Service: Cardiovascular;  Laterality: N/A;   Past Medical History  Diagnosis Date  . Hypertension   . Hypercholesteremia   . Back pain     arthritis  . CVA (cerebral infarction)    BP 182/95 mmHg  Pulse 83  Resp 14  SpO2 98%  Opioid Risk Score:   Fall Risk Score:  `1  Depression screen PHQ 2/9  Depression screen [facility] 2/9 10/28/2014 01/03/2014  Decreased Interest 0 0  Down, Depressed, Hopeless 0 0  PHQ - 2 Score 0 0  Altered sleeping 0 -  Change in appetite 0 -  Feeling bad or failure about yourself  1 -  Trouble concentrating 0 -  Moving slowly or fidgety/restless  0 -  Suicidal thoughts 0 -  PHQ-9 Score 1 -     Review of Systems  Constitutional:       Weight loss  Gastrointestinal: Positive for constipation.  Musculoskeletal: Positive for gait problem.  Skin: Positive for rash.  Neurological: Positive for tremors, weakness and numbness.       Tingling  All other systems reviewed and are negative.      Objective:   Physical Exam Modified Ashworth score of 3 at the finger flexors as well as the elbow flexors as well as the pectoralis Modified Ashworth score 2 at the wrist flexors Motor strength is 3 minus at the deltoid and the biceps and 0 at the finger flexors and extensors 4 minus in the hip flexor and knee extensor, 3 minus at the ankle  dorsiflexors Ambulates with a straight cane no evidence of toe drag or knee instability. Supervision assist       Assessment & Plan:  1. Right MCA infarct 06/03/2014 with left spastic hemiparesis. Still benefiting from outpatient PT and OT. Recommend repeat Botox injection however will increase to 400 units. Plan is to reinject in 1 month with the following doses  Pectoralis 100 units Biceps 100 units Brachioradialis 50 units Flexor carpi radialis 50 units Flexor digitorum longus 50 units Flexor digitorum superficialis 50 units

## 2014-10-28 NOTE — Therapy (Signed)
[facility] [address] [place], [place], [address] Phone: [phone]   Fax:  [phone]  Physical Therapy Treatment  Patient Details  Name: Mary Le MRN: [id number] Date of Birth: [birth year] Referring Provider:  [name], MD  Encounter Date: 10/28/2014      PT End of Session - 10/28/14 1452    Visit Number 16  G6   Number of Visits 24   Date for PT Re-Evaluation 11/08/14   PT Start Time 1448   PT Stop Time 1530   PT Time Calculation (min) 42 min   Equipment Utilized During Treatment Gait belt   Activity Tolerance Patient tolerated treatment well   Behavior During Therapy [facility] for tasks assessed/performed      Past Medical History  Diagnosis Date  . Hypertension   . Hypercholesteremia   . Back pain     arthritis  . CVA (cerebral infarction)     Past Surgical History  Procedure Laterality Date  . Appendectomy    . Tonsillectomy    . Hemorroidectomy    . Tubal ligation    . Radiology with anesthesia N/A 06/03/2014    Procedure: RADIOLOGY WITH ANESTHESIA;  Surgeon: [name], MD;  Location: [facility];  Service: Radiology;  Laterality: N/A;  . Tee without cardioversion N/A 06/07/2014    Procedure: TRANSESOPHAGEAL ECHOCARDIOGRAM (TEE);  Surgeon: [name], MD;  Location: [facility] ENDOSCOPY;  Service: Cardiovascular;  Laterality: N/A;  . Loop recorder implant  06/07/2014    MDT LINQ implanted by Dr [name] for cryptogenic stroke  . Loop recorder implant N/A 06/07/2014    Procedure: LOOP RECORDER IMPLANT;  Surgeon: [name], MD;  Location: [facility] CATH LAB;  Service: Cardiovascular;  Laterality: N/A;    There were no vitals filed for this visit.  Visit Diagnosis:  Abnormality of gait  Impaired functional mobility and activity tolerance      Subjective Assessment - 10/28/14 1451    Subjective Doing okay today, tired after a busy morning with doctors appointments. Was having some left  arm pain, better now after OT session. Saw Dr Aretta Nip this am, pt reports she will have another set of Botox injections next month.   Currently in Pain? No/denies   Pain Score 0-No pain           [facility] Adult PT Treatment/Exercise - 10/28/14 1505    Transfers   Transfers Stand Pivot Transfers   Stand Pivot Transfers 5: Supervision   Stand Pivot Transfer Details (indicate cue type and reason) performed in/out of car x2 reps with cues on ways to ease transitional movements. Pt with increased difficulty lifting left leg into and out of car. Pt also with poor trunk control to support herself while managing her legs. Needs hip flexor and core strengthening to assist with this transfer.                              Ambulation/Gait   Ambulation/Gait Yes   Ambulation/Gait Assistance 4: Min guard   Ambulation/Gait Assistance Details cues on posture and on step length (for equal step length)   Ambulation Distance (Feet) 500 Feet   Assistive device Straight cane   Gait Pattern Step-through pattern;Decreased stride length;Trunk flexed  occasional toe scuffing on the left foot   Ambulation Surface Indoor;Unlevel;Level;Outdoor;Paved   Dynamic Standing Balance   Dynamic Standing - Balance Support No upper extremity supported;During functional activity  Dynamic Standing - Level of Assistance 4: Min assist   Dynamic Standing - Balance Activities Alternating  foot traps   Dynamic Standing - Comments alternating heel taps forward and crossways to 4 inch box x 10 reps each;forwards x 10 reps to 6 inch box; cues on posture and weight shifting with this activity.           PT Short Term Goals - 10/06/14 1617    PT SHORT TERM GOAL #1   Title The patient will return demo HEP indep with written cues.  Target date 09/09/2014   Baseline Pt is walking, but not doing regular leg exercises.   Time 4   Period Weeks   Status On-going   PT SHORT TERM GOAL #2   Title The patient will improve Berg score from  27/56 up to 35/56 to demo decreasing risk for falls.  Target date 09/09/2014   Baseline Goal met on 10/04/2014 with patient improving to 39/56.   Time 4   Period Weeks   Status Achieved   PT SHORT TERM GOAL #3   Title The patient will improve gait speed from 1.44 f t/sec up to 1.8 ft/sec to demo decreasing risk for falls.  Target date 09/09/2014   Baseline No device=1.1 ft/sec and with SPC=   Time 4   Period Weeks   Status On-going   PT SHORT TERM GOAL #4   Title The patient will ambulate household surfaces without a device independently x 400 ft for improved independence in the home.  Target date 09/09/2014   Baseline Pt fatigues after 230 ft of gait.   Time 4   Period Weeks   Status On-going   PT SHORT TERM GOAL #5   Title The patient will move sit<>supine to the right and left sides independently.  Target date 09/09/2014   Baseline Per subjective reports from daughter that patient independent with bed mobility.   Time 4   Period Weeks   Status Achieved           PT Long Term Goals - 10/06/14 1618    PT LONG TERM GOAL #1   Title The patient will verbalize understanding of CVA risk factors/warning signs.  TArget date 10/08/2014   Baseline Reviewed on 10/04/2014   Time 8   Period Weeks   Status Achieved   PT LONG TERM GOAL #2   Title The patient will improve Berg score from 27/56 up to 40/56 to demo decreased risk for falls.  Modified target date 11/08/2014   Baseline Berg=39/56 on 10/04/2014   Time 8   Period Weeks   Status On-going   PT LONG TERM GOAL #3   Title The patient will improve SIS: mobility from 33% up to 45%.  Modified target date 11/08/2014   Time 8   Period Weeks   Status On-going   PT LONG TERM GOAL #4   Title The patient will improve gait speed from 1.44 ft/sec up to 2.3 ft/sec to demo improving functional ambulation. Modified target date 11/08/2014   Baseline Pt's gait speed is 1.07 ft/sec without a device.  Continue emphasizing walking pace.   Time 8    Period Weeks   Status On-going   PT LONG TERM GOAL #5   Title The patient will negotiate 4 steps modified indep with reciprocal pattern and one HR.   Modified target date 11/08/2014   Baseline On 10/04/2014, patient uses step to pattern with one handrail with supervision.   Time 8  Period Weeks   Status On-going   PT LONG TERM GOAL #6   Title The patient will negotiate level community surfaces without a device x 5 minutes independently for improved community mobility.  Modified target date 11/08/2014   Time 8   Period Weeks   Status On-going           Plan - 10/28/14 1453    Clinical Impression Statement Pt progressing toward goals. No foot scuffing noted with gait outdoors on paved surfaces today. Does demo weak hip and core muscles with car transfers today.   Pt will benefit from skilled therapeutic intervention in order to improve on the following deficits Decreased balance;Difficulty walking;Postural dysfunction;Decreased strength;Decreased endurance;Decreased activity tolerance;Decreased mobility;Pain;Abnormal gait   Rehab Potential Good   PT Frequency 2x / week   PT Duration 4 weeks   PT Treatment/Interventions Gait training;Therapeutic exercise;Patient/family education;Balance training;Stair training;Neuromuscular re-education;Therapeutic activities   PT Next Visit Plan Gait on compliant surfaces/outdoor gravel as weather permints; core and hip flexor stregthening (mat ex's for core, leg off/onto mat for hip, etc); assess goals   PT Home Exercise Plan Issued HEP today.   Consulted and Agree with Plan of Care Patient;Family member/caregiver      Problem List Patient Active Problem List   Diagnosis Date Noted  . Spastic hemiplegia affecting nondominant side 07/25/2014  . 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 Left hemiparesis 06/10/2014  . Left-sided neglect 06/10/2014  . 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 Morbid obesity- BMI 40 06/06/2014  . Acute respiratory failure with hypoxia 06/05/2014  . Cerebral infarction due to occlusion of right middle cerebral artery 06/03/2014  . Cerebral infarct 06/03/2014  . Altered mental status 06/03/2014  . GERD (gastroesophageal reflux disease) 04/12/2014  . Benign essential HTN 02/07/2014  . Dyslipidemia- statin intol 02/07/2014  . Chest pain- Low risk Myoview July 2015 02/06/2014    Willow Ora 10/28/2014, 3:46 PM  Willow Ora, PTA, [facility] [address] [place], [place] [address] [phone] 10/28/2014, 3:46 PM

## 2014-11-03 ENCOUNTER — Encounter: Payer: Self-pay | Admitting: Physical Therapy

## 2014-11-03 ENCOUNTER — Ambulatory Visit: Payer: Medicare Other | Admitting: Occupational Therapy

## 2014-11-03 ENCOUNTER — Ambulatory Visit: Payer: Medicare Other | Admitting: Physical Therapy

## 2014-11-03 DIAGNOSIS — R269 Unspecified abnormalities of gait and mobility: Secondary | ICD-10-CM | POA: Diagnosis not present

## 2014-11-03 DIAGNOSIS — Z7409 Other reduced mobility: Secondary | ICD-10-CM

## 2014-11-03 DIAGNOSIS — M25512 Pain in left shoulder: Secondary | ICD-10-CM

## 2014-11-03 DIAGNOSIS — H539 Unspecified visual disturbance: Secondary | ICD-10-CM | POA: Diagnosis not present

## 2014-11-03 DIAGNOSIS — R4189 Other symptoms and signs involving cognitive functions and awareness: Secondary | ICD-10-CM | POA: Diagnosis not present

## 2014-11-03 DIAGNOSIS — G8114 Spastic hemiplegia affecting left nondominant side: Secondary | ICD-10-CM

## 2014-11-03 NOTE — Therapy (Signed)
[facility] [address] [place], [place], [address] Phone: [phone]   Fax:  [phone]  Occupational Therapy Treatment  Patient Details  Name: Mary Le MRN: [id number] Date of Birth: 10/29/1945 Referring Provider:  [name], MD  Encounter Date: 11/03/2014      OT End of Session - 11/03/14 1417    Visit Number 19   Number of Visits 24   Authorization Type medicare - needs G code!   Authorization Time Period check goals 11/04/14   Authorization - Visit Number 42   Authorization - Number of Visits 20   OT Start Time 4970   OT Stop Time 2637   OT Time Calculation (min) 38 min   Activity Tolerance Patient tolerated treatment well   Behavior During Therapy WFL for tasks assessed/performed      Past Medical History  Diagnosis Date  . Hypertension   . Hypercholesteremia   . Back pain     arthritis  . CVA (cerebral infarction)     Past Surgical History  Procedure Laterality Date  . Appendectomy    . Tonsillectomy    . Hemorroidectomy    . Tubal ligation    . Radiology with anesthesia N/A 06/03/2014    Procedure: RADIOLOGY WITH ANESTHESIA;  Surgeon: [name], MD;  Location: [facility];  Service: Radiology;  Laterality: N/A;  . Tee without cardioversion N/A 06/07/2014    Procedure: TRANSESOPHAGEAL ECHOCARDIOGRAM (TEE);  Surgeon: [name], MD;  Location: [facility] ENDOSCOPY;  Service: Cardiovascular;  Laterality: N/A;  . Loop recorder implant  06/07/2014    MDT LINQ implanted by Dr [name] for cryptogenic stroke  . Loop recorder implant N/A 06/07/2014    Procedure: LOOP RECORDER IMPLANT;  Surgeon: [name], MD;  Location: [facility] CATH LAB;  Service: Cardiovascular;  Laterality: N/A;    There were no vitals filed for this visit.  Visit Diagnosis:  Spastic hemiplegia affecting left nondominant side  Pain in joint, shoulder region, left      Subjective Assessment - 11/03/14 1415    Subjective  Botox will be 11/25/14   Patient is accompained by: Family member   Pertinent History see epic snapshot;  HTN, fluctuating BP since stroke   Patient Stated Goals to use LUE   Currently in Pain? Yes   Pain Score 4    Pain Location Arm   Pain Orientation Left   Pain Descriptors / Indicators Aching   Pain Type Chronic pain   Aggravating Factors  malpositioning   Pain Relieving Factors heat, meds                    OT Treatments/Exercises (OP) - 11/03/14 0001    Neurological Re-education Exercises   Scapular Stabilization Bilateral;5 reps;Seated  with 20sec hold   Shoulder Flexion AAROM;Left;Sidelying  with mod facilitation   Shoulder ABduction AAROM;Left  w/ lat. wt. shift + arm supported on ball-mod facilitation   Elbow Extension Self ROM;Left;Seated  reach for the floor   Forearm Supination PROM;Supine  by therapist PROM finger extension by therapist   Other Exercises 1 In supine, rolling/trunk rotation with LUE stabilized for body on arm stretch.  In sitting, trunk rotation with UEs and then with LUE stabilized for body on arm movments.   Modalities   Modalities Moist Heat   Moist Heat Therapy   Number Minutes Moist Heat 10 Minutes   Moist Heat Location Elbow;Shoulder  with no adverse reactions due to pain/stiffness  OT Short Term Goals - 11/03/14 1418    OT SHORT TERM GOAL #1   Title Pt and daughter will be I with HEP - 2/16/20126   Status Achieved   OT SHORT TERM GOAL #2   Title Pt will require a with back only for UE body bathing in shower   Status Achieved  Pt only requires assist with back   OT SHORT TERM GOAL #3   Title Pt will be close s for LB  bathing in shower   Status Achieved  Pt is supervision   OT SHORT TERM GOAL #4   Title  Pt will be min afor UB dressing with AE prn   Status Achieved  pt reports she needs a little help with long sleeve shirts.   OT SHORT TERM GOAL #5   Title Pt will be min a for LB  dressing with AE prn   Status Achieved   OT SHORT TERM GOAL #6   Title Pt will report no more than 4/10 pain in LUE with HEP   Baseline 4-6/10   Status Achieved  at approx this level per pt 11/03/14   OT SHORT TERM GOAL #7   Title Pt will attend to L visual field during functional tasks with min vc's   Status Achieved   OT SHORT TERM GOAL #8   Title I with updated HEP.   Baseline ck 11/04/14   Time 4   Period Weeks   Status New           OT Long Term Goals - 10/04/14 1204    OT LONG TERM GOAL #1   Title Pt and daughter will be mod I with updated HEP prn - 10/04/2014   Status Achieved   OT LONG TERM GOAL #2   Title Pt will be supervision with simple meal prep at ambulatory level   Status Achieved   OT LONG TERM GOAL #3   Title Pt will be mod I with toilet hygiene   Status Achieved   OT LONG TERM GOAL #4   Title Pt will be able to read 3 sentence paragraph with strategies with min vc's   Status Achieved   OT LONG TERM GOAL #5   Title Pt and daughter will be able to identify 2 strategies to compensate for memory deficit.   Status Achieved   Long Term Additional Goals   Additional Long Term Goals Yes   OT LONG TERM GOAL #6   Title Pt will report no greater than 3/10 pain in LUE with HEP   Baseline pain 4-6/10 at times   Status Not Met   OT LONG TERM GOAL #7   Title Pt will demonstrate ability to use LUE as gross stabilizer with basic self care tasks   Status Achieved   OT LONG TERM GOAL #8   Title Pt will demonstrate 45 * shoulder flexion/ scaption in prep for functional reach   Baseline 10/04/14- demonstrates grossly 35-40*   Time 8   Period Weeks   Status New   OT LONG TERM GOAL  #9   Baseline Pt will report that she is using LUE as a gross assist/ stabilizer at least 25 % of the time for ADLS   Time 8   Period Weeks   Status New               Plan - 11/03/14 1417    Clinical Impression Statement Progress slowed by pain/stiffness as pt needs a lot of  stretching and heat to incr PROM   Plan *G code next visit, check remaining STGs next week, then plan to hold until s/p Botox    Consulted and Agree with Plan of Care Patient;Family member/caregiver   Family Member Consulted daughter        Problem List Patient Active Problem List   Diagnosis Date Noted  . Spastic hemiplegia affecting nondominant side 07/25/2014  . 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 Left hemiparesis 06/10/2014  . Left-sided neglect 06/10/2014  . 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 Morbid obesity- BMI 40 06/06/2014  . Acute respiratory failure with hypoxia 06/05/2014  . Cerebral infarction due to occlusion of right middle cerebral artery 06/03/2014  . Cerebral infarct 06/03/2014  . Altered mental status 06/03/2014  . GERD (gastroesophageal reflux disease) 04/12/2014  . Benign essential HTN 02/07/2014  . Dyslipidemia- statin intol 02/07/2014  . Chest pain- Low risk Myoview July 2015 02/06/2014    [facility] 11/03/2014, 5:08 PM  [facility] [address] [place], [place], [address] Phone: [phone]   Fax:  [place], OTR/L 11/03/2014 5:11 PM

## 2014-11-03 NOTE — Patient Instructions (Signed)
Hip Flexor Stretch   Lying on back near edge of bed,with both legs bent. Lift left leg up and off edge of bed to floor. Then lift left leg back up onto bed. Repeat 10 reps. 2 sets. 1-2 times a day.  http://gt2.exer.us/347   Copyright  VHI. All rights reserved.  Bridge   Lie back, legs bent with pillow between knees. Lift hips up and hold for 5 seconds. Repeat _10___ times. Repeat for 2 sets. Do _1-2___ sessions per day.  Copyright  VHI. All rights reserved.  Strengthening: Straight Leg Raise (Phase 1)   Tighten muscles on front of left thigh, then lift leg _3-4___ inches from surface, keeping knee locked/straight. Hold for 5 seconds. Repeat _10___ times per set. Do __2_ sets per session. Do 1-2__ sessions per day.  http://orth.exer.us/614   Copyright  VHI. All rights reserved.  Strengthening: Terminal Knee Extension (Supine)   With right knee over bolster/ball/rolled towel, straighten knee by tightening muscles on top of thigh. Keep bottom of knee on bolster. Hold foot in air for 5 seconds. Repeat _10___ times per set. Do __2__ sets per session. Do __1-2__ sessions per day.  http://orth.exer.us/626   Copyright  VHI. All rights reserved.  Functional Quadriceps: Sit to Stand   Sit on edge of chair, feet flat on floor. Stand upright, extending knees fully. DO NOT USE ARMS!! To ensure you are using the LEFT leg, place your RIGHT leg slightly forward. Repeat __10__ times per set. Do __2__ sets per session. Do _1-2___ sessions per day.  http://orth.exer.us/735   Copyright  VHI. All rights reserved.  FLEXION: Standing - Stable (Active)   Stand, both feet flat. Lift right left  toward ceiling. (high as you can go). Hold there for 5 seconds before lowering to ground. Complete _2__ sets of __10_ repetitions. Perform _1-2__ sessions per day.  http://gtsc.exer.us/29   Copyright  VHI. All rights reserved.

## 2014-11-04 ENCOUNTER — Ambulatory Visit: Payer: Medicare Other

## 2014-11-04 ENCOUNTER — Ambulatory Visit (INDEPENDENT_AMBULATORY_CARE_PROVIDER_SITE_OTHER): Payer: Medicare Other | Admitting: *Deleted

## 2014-11-04 DIAGNOSIS — R4189 Other symptoms and signs involving cognitive functions and awareness: Secondary | ICD-10-CM | POA: Diagnosis not present

## 2014-11-04 DIAGNOSIS — H539 Unspecified visual disturbance: Secondary | ICD-10-CM | POA: Diagnosis not present

## 2014-11-04 DIAGNOSIS — I63411 Cerebral infarction due to embolism of right middle cerebral artery: Secondary | ICD-10-CM | POA: Diagnosis not present

## 2014-11-04 DIAGNOSIS — M25512 Pain in left shoulder: Secondary | ICD-10-CM | POA: Diagnosis not present

## 2014-11-04 DIAGNOSIS — G8114 Spastic hemiplegia affecting left nondominant side: Secondary | ICD-10-CM

## 2014-11-04 DIAGNOSIS — R269 Unspecified abnormalities of gait and mobility: Secondary | ICD-10-CM | POA: Diagnosis not present

## 2014-11-04 DIAGNOSIS — Z7409 Other reduced mobility: Secondary | ICD-10-CM | POA: Diagnosis not present

## 2014-11-04 NOTE — Patient Instructions (Signed)
  Feet Together (Compliant Surface) Varied Arm Positions - Eyes Closed   Stand with your back to a corner, with a chair in front of you in case you need to hold on, and with both feet on compliant surface: two stacked pillows with feet together.Close eyes and visualize upright position. 30 seconds. Perform 2x daily Copyright  VHI. All rights reserved.    Copyright  VHI. All rights reserved.  Feet Apart (Compliant Surface) Head Motion - Eyes Closed   Stand with your back to a corner, with a chair in front of you in case you need to hold on, and with both feet on compliant surface: two stacked pillows with feet apart. Close eyes and move head slowly, up and down 10x, then side to side 10x, then diagonal each direction 10x. For now, hold onto the chair with a fingtertip for support, eventually progress to perform without holding on. Perform daily.Copyright  VHI. All rights reserved.

## 2014-11-04 NOTE — Therapy (Signed)
[facility] [address] [place], [place], [address] Phone: [phone]   Fax:  [phone]  Physical Therapy Treatment  Patient Details  Name: Mary Le MRN: [id number] Date of Birth: [birth year] Referring Provider:  [name], MD  Encounter Date: 11/03/2014      PT End of Session - 11/03/14 1453    Visit Number 35  G7   Number of Visits 24   Date for PT Re-Evaluation 11/08/14   PT Start Time 1448   PT Stop Time 1530   PT Time Calculation (min) 42 min   Equipment Utilized During Treatment Gait belt   Activity Tolerance Patient tolerated treatment well   Behavior During Therapy [facility] for tasks assessed/performed      Past Medical History  Diagnosis Date  . Hypertension   . Hypercholesteremia   . Back pain     arthritis  . CVA (cerebral infarction)     Past Surgical History  Procedure Laterality Date  . Appendectomy    . Tonsillectomy    . Hemorroidectomy    . Tubal ligation    . Radiology with anesthesia N/A 06/03/2014    Procedure: RADIOLOGY WITH ANESTHESIA;  Surgeon: [name], MD;  Location: [facility];  Service: Radiology;  Laterality: N/A;  . Tee without cardioversion N/A 06/07/2014    Procedure: TRANSESOPHAGEAL ECHOCARDIOGRAM (TEE);  Surgeon: [name], MD;  Location: [facility] ENDOSCOPY;  Service: Cardiovascular;  Laterality: N/A;  . Loop recorder implant  06/07/2014    MDT LINQ implanted by Dr [name] for cryptogenic stroke  . Loop recorder implant N/A 06/07/2014    Procedure: LOOP RECORDER IMPLANT;  Surgeon: [name], MD;  Location: [facility] CATH LAB;  Service: Cardiovascular;  Laterality: N/A;    There were no vitals filed for this visit.  Visit Diagnosis:  Impaired functional mobility and activity tolerance      Subjective Assessment - 11/03/14 1452    Subjective No new complaints. No falls.    Currently in Pain? Yes   Pain Score 4    Pain Location Arm   Pain Orientation  Left   Pain Descriptors / Indicators Sore   Pain Type Chronic pain   Pain Onset More than a month ago   Aggravating Factors  malpositioning   Pain Relieving Factors heat, meds      Treatment: Supine on mat: lifting left off mat to floor and back up onto mat 2 sets of 10 reps Bridge with pillow squeeze 5 sec hold 2 sets of 10 reps Left leg straight leg raises with assist to maintain knee extension 5 sec hold 2 sets of 10 reps Left leg short arc quads 5 sec hold 2 sets of 10 reps Sit/stand with no UE support and right leg slightly forward 2 sets of 10 reps Standing with UE support: left hip flexion 5 sec hold 2 sets of 10 reps         PT Education - 11/04/14 1224    Education provided Yes   Education Details HEP for left leg strengthening   Person(s) Educated Patient   Methods Explanation;Demonstration;Handout   Comprehension Verbalized understanding          PT Short Term Goals - 10/06/14 1617    PT SHORT TERM GOAL #1   Title The patient will return demo HEP indep with written cues.  Target date 09/09/2014   Baseline Pt is walking, but not doing regular leg exercises.   Time 4  Period Weeks   Status On-going   PT SHORT TERM GOAL #2   Title The patient will improve Berg score from 27/56 up to 35/56 to demo decreasing risk for falls.  Target date 09/09/2014   Baseline Goal met on 10/04/2014 with patient improving to 39/56.   Time 4   Period Weeks   Status Achieved   PT SHORT TERM GOAL #3   Title The patient will improve gait speed from 1.44 f t/sec up to 1.8 ft/sec to demo decreasing risk for falls.  Target date 09/09/2014   Baseline No device=1.1 ft/sec and with SPC=   Time 4   Period Weeks   Status On-going   PT SHORT TERM GOAL #4   Title The patient will ambulate household surfaces without a device independently x 400 ft for improved independence in the home.  Target date 09/09/2014   Baseline Pt fatigues after 230 ft of gait.   Time 4   Period Weeks   Status  On-going   PT SHORT TERM GOAL #5   Title The patient will move sit<>supine to the right and left sides independently.  Target date 09/09/2014   Baseline Per subjective reports from daughter that patient independent with bed mobility.   Time 4   Period Weeks   Status Achieved           PT Long Term Goals - 10/06/14 1618    PT LONG TERM GOAL #1   Title The patient will verbalize understanding of CVA risk factors/warning signs.  TArget date 10/08/2014   Baseline Reviewed on 10/04/2014   Time 8   Period Weeks   Status Achieved   PT LONG TERM GOAL #2   Title The patient will improve Berg score from 27/56 up to 40/56 to demo decreased risk for falls.  Modified target date 11/08/2014   Baseline Berg=39/56 on 10/04/2014   Time 8   Period Weeks   Status On-going   PT LONG TERM GOAL #3   Title The patient will improve SIS: mobility from 33% up to 45%.  Modified target date 11/08/2014   Time 8   Period Weeks   Status On-going   PT LONG TERM GOAL #4   Title The patient will improve gait speed from 1.44 ft/sec up to 2.3 ft/sec to demo improving functional ambulation. Modified target date 11/08/2014   Baseline Pt's gait speed is 1.07 ft/sec without a device.  Continue emphasizing walking pace.   Time 8   Period Weeks   Status On-going   PT LONG TERM GOAL #5   Title The patient will negotiate 4 steps modified indep with reciprocal pattern and one HR.   Modified target date 11/08/2014   Baseline On 10/04/2014, patient uses step to pattern with one handrail with supervision.   Time 8   Period Weeks   Status On-going   PT LONG TERM GOAL #6   Title The patient will negotiate level community surfaces without a device x 5 minutes independently for improved community mobility.  Modified target date 11/08/2014   Time 8   Period Weeks   Status On-going           Plan - 11/03/14 1453    Clinical Impression Statement Issued HEP focusing on left leg strengthening today with no issues reported  during session while performing them. Progressing toward goals.   Pt will benefit from skilled therapeutic intervention in order to improve on the following deficits Decreased balance;Difficulty walking;Postural dysfunction;Decreased strength;Decreased endurance;Decreased activity  tolerance;Decreased mobility;Pain;Abnormal gait   Rehab Potential Good   PT Frequency 2x / week   PT Duration 4 weeks   PT Treatment/Interventions Gait training;Therapeutic exercise;Patient/family education;Balance training;Stair training;Neuromuscular re-education;Therapeutic activities   PT Next Visit Plan Gait on compliant surfaces/outdoor gravel as weather permints; core and hip flexor stregthening (mat ex's for core, leg off/onto mat for hip, etc); assess goals   PT Home Exercise Plan Issued HEP today.   Consulted and Agree with Plan of Care Patient;Family member/caregiver      Problem List Patient Active Problem List   Diagnosis Date Noted  . Spastic hemiplegia affecting nondominant side 07/25/2014  . 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 Left hemiparesis 06/10/2014  . Left-sided neglect 06/10/2014  . 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 Morbid obesity- BMI 40 06/06/2014  . Acute respiratory failure with hypoxia 06/05/2014  . Cerebral infarction due to occlusion of right middle cerebral artery 06/03/2014  . Cerebral infarct 06/03/2014  . Altered mental status 06/03/2014  . GERD (gastroesophageal reflux disease) 04/12/2014  . Benign essential HTN 02/07/2014  . Dyslipidemia- statin intol 02/07/2014  . Chest pain- Low risk Myoview July 2015 02/06/2014    Willow Ora 11/04/2014, 12:25 PM  Willow Ora, PTA, [facility] [address] [place], [place] [address] [phone] 11/04/2014, 12:25 PM

## 2014-11-04 NOTE — Therapy (Signed)
[facility] [address] [place], [place], [address] Phone: [phone]   Fax:  [phone]  Physical Therapy Treatment  Patient Details  Name: Mary Le MRN: [id number] Date of Birth: [birth year] Referring Provider:  [name], MD  Encounter Date: 11/04/2014      PT End of Session - 11/04/14 1648    Visit Number 18   Number of Visits 24   Date for PT Re-Evaluation 11/08/14   PT Start Time 1448   PT Stop Time 1530   PT Time Calculation (min) 42 min      Past Medical History  Diagnosis Date  . Hypertension   . Hypercholesteremia   . Back pain     arthritis  . CVA (cerebral infarction)     Past Surgical History  Procedure Laterality Date  . Appendectomy    . Tonsillectomy    . Hemorroidectomy    . Tubal ligation    . Radiology with anesthesia N/A 06/03/2014    Procedure: RADIOLOGY WITH ANESTHESIA;  Surgeon: [name], MD;  Location: [facility];  Service: Radiology;  Laterality: N/A;  . Tee without cardioversion N/A 06/07/2014    Procedure: TRANSESOPHAGEAL ECHOCARDIOGRAM (TEE);  Surgeon: [name], MD;  Location: [facility] ENDOSCOPY;  Service: Cardiovascular;  Laterality: N/A;  . Loop recorder implant  06/07/2014    MDT LINQ implanted by Dr [name] for cryptogenic stroke  . Loop recorder implant N/A 06/07/2014    Procedure: LOOP RECORDER IMPLANT;  Surgeon: [name], MD;  Location: [facility] CATH LAB;  Service: Cardiovascular;  Laterality: N/A;    There were no vitals filed for this visit.  Visit Diagnosis:  Spastic hemiplegia affecting left nondominant side  Impaired functional mobility and activity tolerance      Subjective Assessment - 11/04/14 1451    Subjective No new complaints. No falls.    Currently in Pain? No/denies            [facility] PT Assessment - 11/04/14 0001    Berg Balance Le   Sit to Stand Able to stand without using hands and stabilize independently   Standing  Unsupported Able to stand safely 2 minutes   Sitting with Back Unsupported but Feet Supported on Floor or Stool Able to sit safely and securely 2 minutes   Stand to Sit Sits safely with minimal use of hands   Transfers Able to transfer safely, minor use of hands   Standing Unsupported with Eyes Closed Able to stand 10 seconds safely   Standing Ubsupported with Feet Together Able to place feet together independently and stand for 1 minute with supervision   From Standing, Reach Forward with Outstretched Arm Can reach forward >12 cm safely (5")   From Standing Position, Pick up Object from [place] to pick up shoe safely and easily   From Standing Position, Turn to Look Behind Over each Shoulder Turn sideways only but maintains balance   Turn 360 Degrees Able to turn 360 degrees safely but slowly   Standing Unsupported, Alternately Place Feet on Step/Stool Able to complete >2 steps/needs minimal assist   Standing Unsupported, One Foot in Front Able to plae foot ahead of the other independently and hold 30 seconds   Standing on One Leg Tries to lift leg/unable to hold 3 seconds but remains standing independently   Total Score 43     Neuro re-ed: Berg Balance Le  Due to c/o back pain with prolonged standing, performed 3x30 seconds  seated spinal flexion stretch.  Taught corner balance HEP on two stacked pillows with eyes closed: static standing and standing with fingertip support and head turns. See pt instructions for details. These were taught due to pt complaints of unsteadiness in the dark.  Stair training initially with single hand rail and reciprocal pattern to ascend but with step-to pattern to descend. Pt tends to descend leading with her hemiparetic limb. Repeated practice with step downs leading with the strong leg with visual and tactile cues to allow heel rise with anterior weight shift and knee flexion. Then pt demonstrated ability to ascend and descend stairs with step over step  pattern and single hand rail and supervision.                        PT Education - 11/04/14 1528    Education provided Yes   Education Details HEP for balance on compliant surfaces with eyes closed (due to pt reports of feeling unsteady in the dark)   Person(s) Educated Patient   Methods Explanation;Demonstration;Handout   Comprehension Verbalized understanding;Returned demonstration          PT Short Term Goals - 10/06/14 1617    PT SHORT TERM GOAL #1   Title The patient will return demo HEP indep with written cues.  Target date 09/09/2014   Baseline Pt is walking, but not doing regular leg exercises.   Time 4   Period Weeks   Status On-going   PT SHORT TERM GOAL #2   Title The patient will improve Berg score from 27/56 up to 35/56 to demo decreasing risk for falls.  Target date 09/09/2014   Baseline Goal met on 10/04/2014 with patient improving to 39/56.   Time 4   Period Weeks   Status Achieved   PT SHORT TERM GOAL #3   Title The patient will improve gait speed from 1.44 f t/sec up to 1.8 ft/sec to demo decreasing risk for falls.  Target date 09/09/2014   Baseline No device=1.1 ft/sec and with SPC=   Time 4   Period Weeks   Status On-going   PT SHORT TERM GOAL #4   Title The patient will ambulate household surfaces without a device independently x 400 ft for improved independence in the home.  Target date 09/09/2014   Baseline Pt fatigues after 230 ft of gait.   Time 4   Period Weeks   Status On-going   PT SHORT TERM GOAL #5   Title The patient will move sit<>supine to the right and left sides independently.  Target date 09/09/2014   Baseline Per subjective reports from daughter that patient independent with bed mobility.   Time 4   Period Weeks   Status Achieved           PT Long Term Goals - 11/04/14 1452    PT LONG TERM GOAL #1   Title The patient will verbalize understanding of CVA risk factors/warning signs.  TArget date 10/08/2014   Baseline  Reviewed on 10/04/2014   Time 8   Period Weeks   Status Achieved   PT LONG TERM GOAL #2   Title The patient will improve Berg score from 27/56 up to 40/56 to demo decreased risk for falls.  Modified target date 11/08/2014   Baseline Berg=39/56 on 10/04/2014   Time 8   Period Weeks   Status Achieved  Scored 43/56 on 11/04/14   PT LONG TERM GOAL #3   Title The patient  will improve SIS: mobility from 33% up to 45%.  Modified target date 11/08/2014   Time 8   Period Weeks   Status On-going   PT LONG TERM GOAL #4   Title The patient will improve gait speed from 1.44 ft/sec up to 2.3 ft/sec to demo improving functional ambulation. Modified target date 11/08/2014   Baseline Pt's gait speed is 1.07 ft/sec without a device.  Continue emphasizing walking pace.   Time 8   Period Weeks   Status On-going   PT LONG TERM GOAL #5   Title The patient will negotiate 4 steps modified indep with reciprocal pattern and one HR.   Modified target date 11/08/2014   Baseline On 10/04/2014, patient uses step to pattern with one handrail with supervision.   Time 8   Period Weeks   Status On-going  on 11/04/14 pt uses one HR and step over step (reciprocal) and supervision   PT LONG TERM GOAL #6   Title The patient will negotiate level community surfaces without a device x 5 minutes independently for improved community mobility.  Modified target date 11/08/2014   Time 8   Period Weeks   Status On-going               Plan - 11/04/14 1649    Clinical Impression Statement Pt met and exceeded Berg goal today and is progressing with stair negotiation but continues to require supervision with reciprocal pattern. Pt expected to continue to make progress; however will likely hold therapy until after botox injections.    PT Next Visit Plan Gait on compliant surfaces/outdoor gravel as weather permints; core and hip flexor stregthening (mat ex's for core, leg off/onto mat for hip, etc); assess goals        Problem  List Patient Active Problem List   Diagnosis Date Noted  . Spastic hemiplegia affecting nondominant side 07/25/2014  . 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 Left hemiparesis 06/10/2014  . Left-sided neglect 06/10/2014  . 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 Morbid obesity- BMI 40 06/06/2014  . Acute respiratory failure with hypoxia 06/05/2014  . Cerebral infarction due to occlusion of right middle cerebral artery 06/03/2014  . Cerebral infarct 06/03/2014  . Altered mental status 06/03/2014  . GERD (gastroesophageal reflux disease) 04/12/2014  . Benign essential HTN 02/07/2014  . Dyslipidemia- statin intol 02/07/2014  . Chest pain- Low risk Myoview July 2015 02/06/2014   Delrae Sawyers, PT,DPT,NCS 11/04/2014 4:52 PM Phone [phone] FAX [phone]         [facility] [address] [place], [place], [address] Phone: [phone]   Fax:  [phone]

## 2014-11-08 ENCOUNTER — Ambulatory Visit: Payer: Medicare Other | Admitting: Occupational Therapy

## 2014-11-08 ENCOUNTER — Ambulatory Visit: Payer: Medicare Other | Admitting: Physical Therapy

## 2014-11-08 ENCOUNTER — Encounter: Payer: Self-pay | Admitting: Physical Therapy

## 2014-11-08 DIAGNOSIS — G8114 Spastic hemiplegia affecting left nondominant side: Secondary | ICD-10-CM | POA: Diagnosis not present

## 2014-11-08 DIAGNOSIS — M25512 Pain in left shoulder: Secondary | ICD-10-CM | POA: Diagnosis not present

## 2014-11-08 DIAGNOSIS — R269 Unspecified abnormalities of gait and mobility: Secondary | ICD-10-CM | POA: Diagnosis not present

## 2014-11-08 DIAGNOSIS — R4189 Other symptoms and signs involving cognitive functions and awareness: Secondary | ICD-10-CM

## 2014-11-08 DIAGNOSIS — Z7409 Other reduced mobility: Secondary | ICD-10-CM

## 2014-11-08 DIAGNOSIS — H539 Unspecified visual disturbance: Secondary | ICD-10-CM | POA: Diagnosis not present

## 2014-11-08 NOTE — Therapy (Signed)
[facility] [address] [place], [place], [address] Phone: [phone]   Fax:  [phone]  Occupational Therapy Treatment  Patient Details  Name: Mary Le MRN: [id number] Date of Birth: [birth year] Referring Provider:  [name], MD  Encounter Date: 11/08/2014      OT End of Session - 11/08/14 1601    Visit Number 20   Number of Visits 21   Date for OT Re-Evaluation 11/11/14   Authorization Type medicare - needs G code!   Authorization - Visit Number 20   Authorization - Number of Visits 20   OT Start Time 9528   OT Stop Time 1445   OT Time Calculation (min) 42 min   Activity Tolerance Patient tolerated treatment well   Behavior During Therapy WFL for tasks assessed/performed      Past Medical History  Diagnosis Date  . Hypertension   . Hypercholesteremia   . Back pain     arthritis  . CVA (cerebral infarction)     Past Surgical History  Procedure Laterality Date  . Appendectomy    . Tonsillectomy    . Hemorroidectomy    . Tubal ligation    . Radiology with anesthesia N/A 06/03/2014    Procedure: RADIOLOGY WITH ANESTHESIA;  Surgeon: [name], MD;  Location: [facility];  Service: Radiology;  Laterality: N/A;  . Tee without cardioversion N/A 06/07/2014    Procedure: TRANSESOPHAGEAL ECHOCARDIOGRAM (TEE);  Surgeon: [name], MD;  Location: [facility] ENDOSCOPY;  Service: Cardiovascular;  Laterality: N/A;  . Loop recorder implant  06/07/2014    MDT LINQ implanted by Dr [name] for cryptogenic stroke  . Loop recorder implant N/A 06/07/2014    Procedure: LOOP RECORDER IMPLANT;  Surgeon: [name], MD;  Location: [facility] CATH LAB;  Service: Cardiovascular;  Laterality: N/A;    There were no vitals filed for this visit.  Visit Diagnosis:  Spastic hemiplegia affecting left nondominant side  Impaired functional mobility and activity tolerance  Pain in joint, shoulder region, left  Impaired  cognition      Subjective Assessment - 11/08/14 1404    Subjective  Botox will be 11/25/14   Pertinent History see epic snapshot;  HTN, fluctuating BP since stroke   Patient Stated Goals to use LUE   Currently in Pain? Yes   Pain Score 4    Pain Location Arm   Pain Orientation Left   Pain Descriptors / Indicators Sore   Pain Type Chronic pain   Pain Onset More than a month ago   Pain Frequency Intermittent   Aggravating Factors  malpositioning   Pain Relieving Factors heat, meds   Multiple Pain Sites No       treatment: supine, trunk rotation with self ROM, followed by gentle P/ROM to left shoulder elbow and hand. Seated weightbearing through left elbow over ball with lateral weight shifts, then UE Ranger for AA/ROM shoulder flexion and cricumbduction. Therapist started checking goals, anticipate d/c next visit.                         OT Short Term Goals - 11/08/14 1438    OT SHORT TERM GOAL #1   Title Pt and daughter will be I with HEP - 2/16/20126   Status Achieved   OT SHORT TERM GOAL #2   Title Pt will require a with back only for UE body bathing in shower   Status Achieved  Pt only  requires assist with back   OT SHORT TERM GOAL #3   Title Pt will be close s for LB  bathing in shower   Status Achieved  Pt is supervision   OT SHORT TERM GOAL #4   Title  Pt will be min afor UB dressing with AE prn   Status Achieved  pt reports she needs a little help with long sleeve shirts.   OT SHORT TERM GOAL #5   Title Pt will be min a for LB dressing with AE prn   Status Achieved   OT SHORT TERM GOAL #6   Title Pt will report no more than 4/10 pain in LUE with HEP   Baseline 4-6/10   Status Achieved  at approx this level per pt 11/03/14   OT SHORT TERM GOAL #7   Title Pt will attend to L visual field during functional tasks with min vc's   Status Achieved   OT SHORT TERM GOAL #8   Title I with updated HEP.   Baseline ck 11/04/14   Time 4   Period  Weeks   Status Achieved           OT Long Term Goals - 12/06/14 1442    OT LONG TERM GOAL #1   Title Pt and daughter will be mod I with updated HEP prn - 10/04/2014   Status Achieved   OT LONG TERM GOAL #2   Title Pt will be supervision with simple meal prep at ambulatory level   Status Achieved   OT LONG TERM GOAL #3   Title Pt will be mod I with toilet hygiene   Status Achieved   OT LONG TERM GOAL #4   Title Pt will be able to read 3 sentence paragraph with strategies with min vc's   Status Achieved   OT LONG TERM GOAL #5   Title Pt and daughter will be able to identify 2 strategies to compensate for memory deficit.   Status Achieved   OT LONG TERM GOAL #6   Title Pt will report no greater than 3/10 pain in LUE with HEP   Baseline pain 4-6/10 at times   Status Not Met   OT LONG TERM GOAL #7   Title Pt will demonstrate ability to use LUE as gross stabilizer with basic self care tasks   Status Achieved   OT LONG TERM GOAL #8   Title Pt will demonstrate 45 * shoulder flexion/ scaption in prep for functional reach   Baseline 45-50 following stretching   Time 8   Period Weeks   Status Achieved   OT LONG TERM GOAL  #9   Baseline Pt will report that she is using LUE as a gross assist/ stabilizer at least 25 % of the time for ADLS   Time 8  pt only uses only 10 % of the time.   Period Weeks   Status Not Met               Plan - 2014/12/06 1426    Clinical Impression Statement Pt is progressing slowly limited by pain and spasticity.   Clinical Impairments Affecting Rehab Potential pain, spasticity   Plan check remianing Goals, reinforce HEP, G-code, d/c   OT Home Exercise Plan supine LUE sidelying, and trunk rotation- issued 10/06/14   Consulted and Agree with Plan of Care Patient          G-Codes - Dec 06, 2014 1435    Functional Assessment Tool Used Pt reports dressing  modified independently when she alots enough time. supervision with bathing   Functional  Limitation Self care   Self Care Current Status [phone]) At least 20 percent but less than 40 percent impaired, limited or restricted   Self Care Goal Status (G2836) At least 20 percent but less than 40 percent impaired, limited or restricted      Problem List Patient Active Problem List   Diagnosis Date Noted  . Spastic hemiplegia affecting nondominant side 07/25/2014  . 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 Left hemiparesis 06/10/2014  . Left-sided neglect 06/10/2014  . 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 Morbid obesity- BMI 40 06/06/2014  . Acute respiratory failure with hypoxia 06/05/2014  . Cerebral infarction due to occlusion of right middle cerebral artery 06/03/2014  . Cerebral infarct 06/03/2014  . Altered mental status 06/03/2014  . GERD (gastroesophageal reflux disease) 04/12/2014  . Benign essential HTN 02/07/2014  . Dyslipidemia- statin intol 02/07/2014  . Chest pain- Low risk Myoview July 2015 02/06/2014    RINE,KATHRYN 11/08/2014, 4:07 PM Theone Murdoch, OTR/L [phone] Phone: [phone] 4:09 PM 11/08/2014 [facility] [address] [place], [place], [address] Phone: [phone]   Fax:  [phone]

## 2014-11-09 NOTE — Therapy (Signed)
[facility] [address] [place], [place], [address] Phone: [phone]   Fax:  [phone]  Physical Therapy Treatment  Patient Details  Name: Mary Le MRN: [id number] Date of Birth: [birth year] Referring Provider:  [name], MD  Encounter Date: 11/08/2014      PT End of Session - 11/08/14 1450    Visit Number 19   Number of Visits 24   Date for PT Re-Evaluation 11/08/14   PT Start Time 2947   PT Stop Time 1528   PT Time Calculation (min) 41 min      Past Medical History  Diagnosis Date  . Hypertension   . Hypercholesteremia   . Back pain     arthritis  . CVA (cerebral infarction)     Past Surgical History  Procedure Laterality Date  . Appendectomy    . Tonsillectomy    . Hemorroidectomy    . Tubal ligation    . Radiology with anesthesia N/A 06/03/2014    Procedure: RADIOLOGY WITH ANESTHESIA;  Surgeon: [name], MD;  Location: [facility];  Service: Radiology;  Laterality: N/A;  . Tee without cardioversion N/A 06/07/2014    Procedure: TRANSESOPHAGEAL ECHOCARDIOGRAM (TEE);  Surgeon: [name], MD;  Location: [facility] ENDOSCOPY;  Service: Cardiovascular;  Laterality: N/A;  . Loop recorder implant  06/07/2014    MDT LINQ implanted by Dr [name] for cryptogenic stroke  . Loop recorder implant N/A 06/07/2014    Procedure: LOOP RECORDER IMPLANT;  Surgeon: [name], MD;  Location: [facility] CATH LAB;  Service: Cardiovascular;  Laterality: N/A;    There were no vitals filed for this visit.  Visit Diagnosis:  Abnormality of gait      Subjective Assessment - 11/08/14 1449    Subjective No new complaints. No falls.    Currently in Pain? Yes   Pain Score 4    Pain Location Arm   Pain Orientation Left   Pain Descriptors / Indicators Sore   Pain Type Chronic pain   Pain Onset More than a month ago   Aggravating Factors  malpositioning   Pain Relieving Factors heat, meds             [facility] Adult PT Treatment/Exercise - 11/08/14 1504    Ambulation/Gait   Ambulation/Gait Yes   Ambulation/Gait Assistance 4: Min guard;5: Supervision   Ambulation/Gait Assistance Details occasional cues to increase left foot clearance with swing phase   Ambulation Distance (Feet) 500 Feet  215x1, 335 x1 no device   Assistive device Straight cane   Gait Pattern Step-through pattern;Decreased stride length;Trunk flexed   Ambulation Surface Level;Unlevel;Indoor;Outdoor;Paved   Gait velocity 24.85 with cane (normal pace)= 1.32 ft/sec. 10.72 with cane (fastest pace)= 3.06 ft/sec.   22.32 without device (normal pace)= 1.47 ft/sec.   11.28 without device (fastest pace) = 2.91 ft/sec.                                     PT Short Term Goals - 10/06/14 1617    PT SHORT TERM GOAL #1   Title The patient will return demo HEP indep with written cues.  Target date 09/09/2014   Baseline Pt is walking, but not doing regular leg exercises.   Time 4   Period Weeks   Status On-going   PT SHORT TERM GOAL #2   Title The patient will improve Berg score from 27/56  up to 35/56 to demo decreasing risk for falls.  Target date 09/09/2014   Baseline Goal met on 10/04/2014 with patient improving to 39/56.   Time 4   Period Weeks   Status Achieved   PT SHORT TERM GOAL #3   Title The patient will improve gait speed from 1.44 f t/sec up to 1.8 ft/sec to demo decreasing risk for falls.  Target date 09/09/2014   Baseline No device=1.1 ft/sec and with SPC=   Time 4   Period Weeks   Status On-going   PT SHORT TERM GOAL #4   Title The patient will ambulate household surfaces without a device independently x 400 ft for improved independence in the home.  Target date 09/09/2014   Baseline Pt fatigues after 230 ft of gait.   Time 4   Period Weeks   Status On-going   PT SHORT TERM GOAL #5   Title The patient will move sit<>supine to the right and left sides independently.  Target date 09/09/2014   Baseline Per subjective  reports from daughter that patient independent with bed mobility.   Time 4   Period Weeks   Status Achieved           PT Long Term Goals - 11/08/14 1451    PT LONG TERM GOAL #1   Title The patient will verbalize understanding of CVA risk factors/warning signs.  TArget date 10/08/2014   Baseline Reviewed on 10/04/2014   Status Achieved   PT LONG TERM GOAL #2   Title The patient will improve Berg score from 27/56 up to 40/56 to demo decreased risk for falls.  Modified target date 11/08/2014   Baseline Berg=39/56 on 10/04/2014   Status Achieved  Scored 43/56 on 11/04/14   PT LONG TERM GOAL #3   Title The patient will improve SIS: mobility from 33% up to 45%.  Modified target date 11/08/2014   Status On-going   PT LONG TERM GOAL #4   Title The patient will improve gait speed from 1.44 ft/sec up to 2.3 ft/sec to demo improving functional ambulation. Modified target date 11/08/2014   Status On-going   PT LONG TERM GOAL #5   Title The patient will negotiate 4 steps modified indep with reciprocal pattern and one HR.   Modified target date 11/08/2014   Status On-going  on 11/04/14 pt uses one HR and step over step (reciprocal) and supervision   PT LONG TERM GOAL #6   Title The patient will negotiate level community surfaces without a device x 5 minutes independently for improved community mobility.  Modified target date 11/08/2014   Status On-going           Plan - 11/08/14 1450    Clinical Impression Statement Pt able to meet her gait speed goal when cued to walk faster. Demo'd safe gait speed with decreased foot scuffing during faster gait speeds. Progressing toward goals.   Pt will benefit from skilled therapeutic intervention in order to improve on the following deficits Decreased balance;Difficulty walking;Postural dysfunction;Decreased strength;Decreased endurance;Decreased activity tolerance;Decreased mobility;Pain;Abnormal gait   Rehab Potential Good   PT Frequency 2x / week   PT  Duration 4 weeks   PT Treatment/Interventions Gait training;Therapeutic exercise;Patient/family education;Balance training;Stair training;Neuromuscular re-education;Therapeutic activities   PT Next Visit Plan Discharge next visit. G-code due as well (10th visit and discharge).   PT Home Exercise Plan Issued HEP today.   Consulted and Agree with Plan of Care Patient;Family member/caregiver  Problem List Patient Active Problem List   Diagnosis Date Noted  . Spastic hemiplegia affecting nondominant side 07/25/2014  . 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 Left hemiparesis 06/10/2014  . Left-sided neglect 06/10/2014  . 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 Morbid obesity- BMI 40 06/06/2014  . Acute respiratory failure with hypoxia 06/05/2014  . Cerebral infarction due to occlusion of right middle cerebral artery 06/03/2014  . Cerebral infarct 06/03/2014  . Altered mental status 06/03/2014  . GERD (gastroesophageal reflux disease) 04/12/2014  . Benign essential HTN 02/07/2014  . Dyslipidemia- statin intol 02/07/2014  . Chest pain- Low risk Myoview July 2015 02/06/2014    Willow Ora 11/09/2014, 12:31 PM  Willow Ora, PTA, [facility] [address] [place], [place] [address] [phone] 11/09/2014, 12:31 PM

## 2014-11-09 NOTE — Progress Notes (Signed)
Loop recorder 

## 2014-11-11 ENCOUNTER — Ambulatory Visit: Payer: Medicare Other | Admitting: Physical Therapy

## 2014-11-11 ENCOUNTER — Ambulatory Visit: Payer: Medicare Other | Admitting: Occupational Therapy

## 2014-11-11 ENCOUNTER — Encounter: Payer: Self-pay | Admitting: Physical Therapy

## 2014-11-11 DIAGNOSIS — R269 Unspecified abnormalities of gait and mobility: Secondary | ICD-10-CM

## 2014-11-11 DIAGNOSIS — Z7409 Other reduced mobility: Secondary | ICD-10-CM | POA: Diagnosis not present

## 2014-11-11 DIAGNOSIS — R4189 Other symptoms and signs involving cognitive functions and awareness: Secondary | ICD-10-CM | POA: Diagnosis not present

## 2014-11-11 DIAGNOSIS — H539 Unspecified visual disturbance: Secondary | ICD-10-CM

## 2014-11-11 DIAGNOSIS — G8114 Spastic hemiplegia affecting left nondominant side: Secondary | ICD-10-CM | POA: Diagnosis not present

## 2014-11-11 DIAGNOSIS — M25512 Pain in left shoulder: Secondary | ICD-10-CM | POA: Diagnosis not present

## 2014-11-11 NOTE — Therapy (Signed)
[facility] [address] [place], [place], [address] Phone: [phone]   Fax:  [phone]  Occupational Therapy Treatment  Patient Details  Name: Mary Le MRN: [id number] Date of Birth: 08/24/1945 Referring Provider:  [name], MD  Encounter Date: 11/11/2014      OT End of Session - 11/11/14 1435    Visit Number 21   Number of Visits 21   Date for OT Re-Evaluation 11/11/14   Authorization Time Period check goals 11/04/14   OT Start Time 1404   OT Stop Time 1445   OT Time Calculation (min) 41 min   Activity Tolerance Patient tolerated treatment well   Behavior During Therapy [facility] for tasks assessed/performed      Past Medical History  Diagnosis Date  . Hypertension   . Hypercholesteremia   . Back pain     arthritis  . CVA (cerebral infarction)     Past Surgical History  Procedure Laterality Date  . Appendectomy    . Tonsillectomy    . Hemorroidectomy    . Tubal ligation    . Radiology with anesthesia N/A 06/03/2014    Procedure: RADIOLOGY WITH ANESTHESIA;  Surgeon: [name], MD;  Location: [facility];  Service: Radiology;  Laterality: N/A;  . Tee without cardioversion N/A 06/07/2014    Procedure: TRANSESOPHAGEAL ECHOCARDIOGRAM (TEE);  Surgeon: [name], MD;  Location: [facility] ENDOSCOPY;  Service: Cardiovascular;  Laterality: N/A;  . Loop recorder implant  06/07/2014    MDT LINQ implanted by Dr [name] for cryptogenic stroke  . Loop recorder implant N/A 06/07/2014    Procedure: LOOP RECORDER IMPLANT;  Surgeon: [name], MD;  Location: [facility] CATH LAB;  Service: Cardiovascular;  Laterality: N/A;    There were no vitals filed for this visit.  Visit Diagnosis:  Spastic hemiplegia affecting left nondominant side  Pain in joint, shoulder region, left  Impaired cognition  Impaired functional mobility and activity tolerance  Impaired visual perception      Subjective Assessment  - 11/11/14 1428    Subjective  Botox will be 11/25/14   Pertinent History see epic snapshot;  HTN, fluctuating BP since stroke   Patient Stated Goals to use LUE   Currently in Pain? Yes   Pain Score 3    Pain Orientation Left   Pain Descriptors / Indicators Sore   Pain Type Chronic pain   Pain Onset More than a month ago   Pain Frequency Intermittent   Aggravating Factors  malpositioning   Pain Relieving Factors heat, meds   Effect of Pain on Daily Activities limits functional use   Multiple Pain Sites No   Multiple Pain Sites No        Treatment: Hotpack x 10 mins, to left upper arm and elbow due to tightness, no adverse reaction. Supine rolling side to side, with shoulder flexion, elbow extension, self A/ROM   Gentle weightbearing through left elbow while disengaging RUE, followed by reaching for the floor. Therapist performed P/ROM for  wrist , hand and forearm. Therapist finished checking goals, pt agrees with d/c until after botox.                         OT Short Term Goals - 11/11/14 1432    OT SHORT TERM GOAL #1   Title Pt and daughter will be I with HEP - 2/16/20126   Status Achieved   OT SHORT TERM GOAL #2  Title Pt will require a with back only for UE body bathing in shower   Status Achieved  Pt only requires assist with back   OT SHORT TERM GOAL #3   Title Pt will be close s for LB  bathing in shower   Status Achieved  Pt is supervision   OT SHORT TERM GOAL #4   Title  Pt will be min afor UB dressing with AE prn   Status Achieved  pt reports she needs a little help with long sleeve shirts.   OT SHORT TERM GOAL #5   Title Pt will be min a for LB dressing with AE prn   Status Achieved   OT SHORT TERM GOAL #6   Title Pt will report no more than 4/10 pain in LUE with HEP   Baseline 4-6/10   Status Achieved  at approx this level per pt 11/03/14   OT SHORT TERM GOAL #7   Title Pt will attend to L visual field during functional tasks with  min vc's   Status Achieved   OT SHORT TERM GOAL #8   Title I with updated HEP.   Baseline ck 11/04/14   Time 4   Period Weeks   Status Achieved           OT Long Term Goals - 11/11/14 1433    OT LONG TERM GOAL #1   Title Pt and daughter will be mod I with updated HEP prn - 10/04/2014   Status Achieved   OT LONG TERM GOAL #2   Title Pt will be supervision with simple meal prep at ambulatory level   Status Achieved   OT LONG TERM GOAL #3   Title Pt will be mod I with toilet hygiene   Status Achieved   OT LONG TERM GOAL #4   Title Pt will be able to read 3 sentence paragraph with strategies with min vc's   Status Achieved   OT LONG TERM GOAL #5   Title Pt and daughter will be able to identify 2 strategies to compensate for memory deficit.   Status Achieved   OT LONG TERM GOAL #6   Title Pt will report no greater than 3/10 pain in LUE with HEP   Baseline pain 4-6/10 at times   Status Not Met   OT LONG TERM GOAL #7   Title Pt will demonstrate ability to use LUE as gross stabilizer with basic self care tasks   Status Achieved   OT LONG TERM GOAL #8   Title Pt will demonstrate 45 * shoulder flexion/ scaption in prep for functional reach   Baseline 45-50 following stretching   Time 8   Period Weeks   Status Achieved   OT LONG TERM GOAL  #9   Baseline Pt will report that she is using LUE as a gross assist/ stabilizer at least 25 % of the time for ADLS   Time 8  pt only uses only 10 % -20 %   Period Weeks   Status Not Met               Plan - 11/11/14 1430    Clinical Impression Statement Pt agrees with palns to discharge.   Pt will benefit from skilled therapeutic intervention in order to improve on the following deficits (Retired) Decreased activity tolerance;Decreased balance;Decreased cognition;Decreased coordination;Decreased range of motion;Decreased safety awareness;Decreased mobility;Decreased knowledge of use of DME;Decreased strength;Increased edema;Impaired  UE functional use;Impaired tone;Pain;Impaired sensation;Impaired perceived functional ability;Improper body mechanics;Obesity  Rehab Potential Good   Clinical Impairments Affecting Rehab Potential pain, spasticity   OT Frequency 2x / week   OT Duration 8 weeks   OT Treatment/Interventions Self-care/ADL training;Ultrasound;Moist Heat;Fluidtherapy;Therapeutic exercise;Neuromuscular education;Energy conservation;Manual Therapy;Functional Mobility Training;DME and/or AE instruction;Passive range of motion;Visual/perceptual remediation/compensation;Cognitive remediation/compensation;Therapeutic activities;Splinting;Patient/family education;Balance training   Plan d/c OT   OT Home Exercise Plan supine LUE sidelying, and trunk rotation- issued 10/06/14   Consulted and Agree with Plan of Care Patient        Problem List Patient Active Problem List   Diagnosis Date Noted  . Spastic hemiplegia affecting nondominant side 07/25/2014  . 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 Left hemiparesis 06/10/2014  . Left-sided neglect 06/10/2014  . 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 Morbid obesity- BMI 40 06/06/2014  . Acute respiratory failure with hypoxia 06/05/2014  . Cerebral infarction due to occlusion of right middle cerebral artery 06/03/2014  . Cerebral infarct 06/03/2014  . Altered mental status 06/03/2014  . GERD (gastroesophageal reflux disease) 04/12/2014  . Benign essential HTN 02/07/2014  . Dyslipidemia- statin intol 02/07/2014  . Chest pain- Low risk Myoview July 2015 02/06/2014  OCCUPATIONAL THERAPY DISCHARGE SUMMARY    Current functional level related to goals / functional outcomes: 7/9 long term goals met. Pt demonstrates excellent progress yet she was limited by pain and spasticity.   Remaining deficits: LUE weakness, pain, spasticity, decreased coordination, cognitive deficits   Education / Equipment: Pt  was instructed in HEP, splint wear and care, edema control and safety for IADLs. Pt verbalizes understanding of all education. Pt is d/c until after her next botox injection. Plan: Patient agrees to discharge.  Patient goals were partially met. Patient is being discharged due to meeting the stated rehab goals.  ?????      Essa Malachi 11/11/2014, 2:40 PM Theone Murdoch, OTR/L [phone] Phone: [phone] 2:41 PM 11/11/2014 [facility] [address] [place], [place], [address] Phone: [phone]   Fax:  [phone]

## 2014-11-13 NOTE — Therapy (Signed)
[facility] [address] [place], [place], [address] Phone: [phone]   Fax:  [phone]  Physical Therapy Treatment  Patient Details  Name: Mary Le MRN: [id number] Date of Birth: [birth year] Referring Provider:  [name], MD  Encounter Date: 11/11/2014   11/11/14 1448  PT Visits / Re-Eval  Visit Number 20  Number of Visits 24  Date for PT Re-Evaluation 11/08/14  PT Time Calculation  PT Start Time 0762  PT Stop Time 1530  PT Time Calculation (min) 43 min  PT - End of Session  Equipment Utilized During Treatment Gait belt  Activity Tolerance Patient tolerated treatment well  Behavior During Therapy [facility] for tasks assessed/performed      Past Medical History  Diagnosis Date  . Hypertension   . Hypercholesteremia   . Back pain     arthritis  . CVA (cerebral infarction)     Past Surgical History  Procedure Laterality Date  . Appendectomy    . Tonsillectomy    . Hemorroidectomy    . Tubal ligation    . Radiology with anesthesia N/A 06/03/2014    Procedure: RADIOLOGY WITH ANESTHESIA;  Surgeon: [name], MD;  Location: [place];  Service: Radiology;  Laterality: N/A;  . Tee without cardioversion N/A 06/07/2014    Procedure: TRANSESOPHAGEAL ECHOCARDIOGRAM (TEE);  Surgeon: [name], MD;  Location: [facility] ENDOSCOPY;  Service: Cardiovascular;  Laterality: N/A;  . Loop recorder implant  06/07/2014    MDT LINQ implanted by Dr [name] for cryptogenic stroke  . Loop recorder implant N/A 06/07/2014    Procedure: LOOP RECORDER IMPLANT;  Surgeon: [name], MD;  Location: [facility] CATH LAB;  Service: Cardiovascular;  Laterality: N/A;    There were no vitals filed for this visit.  Visit Diagnosis:  Abnormality of gait  Impaired functional mobility and activity tolerance     11/11/14 1451  Ambulation/Gait  Ambulation/Gait Yes  Ambulation/Gait Assistance 4: Min guard;5: Supervision   Ambulation/Gait Assistance Details cues on equal step length. pt with good posture and foot clearance.  Ambulation Distance (Feet) 440 Feet (220 x 1)  Assistive device None  Gait Pattern Step-through pattern;Decreased stride length;Trunk flexed  Ambulation Surface Level;Indoor  Stairs Yes  Stairs Assistance 6: Modified independent (Device/Increase time)  Stairs Assistance Details (indicate cue type and reason) one rail both up and down stairs  Stair Management Technique One rail Right;Alternating pattern;Forwards;One rail Left  Number of Stairs 4    Self care Reviewed all issued HEP with pt. Patient without any questions or issues with them.          PT Short Term Goals - 10/06/14 1617    PT SHORT TERM GOAL #1   Title The patient will return demo HEP indep with written cues.  Target date 09/09/2014   Baseline Pt is walking, but not doing regular leg exercises.   Time 4   Period Weeks   Status On-going   PT SHORT TERM GOAL #2   Title The patient will improve Berg score from 27/56 up to 35/56 to demo decreasing risk for falls.  Target date 09/09/2014   Baseline Goal met on 10/04/2014 with patient improving to 39/56.   Time 4   Period Weeks   Status Achieved   PT SHORT TERM GOAL #3   Title The patient will improve gait speed from 1.44 f t/sec up to 1.8 ft/sec to demo decreasing risk for falls.  Target date 09/09/2014   Baseline  No device=1.1 ft/sec and with SPC=   Time 4   Period Weeks   Status On-going   PT SHORT TERM GOAL #4   Title The patient will ambulate household surfaces without a device independently x 400 ft for improved independence in the home.  Target date 09/09/2014   Baseline Pt fatigues after 230 ft of gait.   Time 4   Period Weeks   Status On-going   PT SHORT TERM GOAL #5   Title The patient will move sit<>supine to the right and left sides independently.  Target date 09/09/2014   Baseline Per subjective reports from daughter that patient independent with bed  mobility.   Time 4   Period Weeks   Status Achieved           PT Long Term Goals - 11/11/14 1449    PT LONG TERM GOAL #1   Title The patient will verbalize understanding of CVA risk factors/warning signs.  TArget date 10/08/2014   Baseline Reviewed on 10/04/2014   Status Achieved   PT LONG TERM GOAL #2   Title The patient will improve Berg score from 27/56 up to 40/56 to demo decreased risk for falls.  Modified target date 11/08/2014   Baseline Berg=39/56 on 10/04/2014   Status Achieved  Scored 43/56 on 11/04/14   PT LONG TERM GOAL #3   Title The patient will improve SIS: mobility from 33% up to 45%.  Modified target date 11/08/2014   Baseline 4/22- scored 80.6% today.   Status Achieved   PT LONG TERM GOAL #4   Title The patient will improve gait speed from 1.44 ft/sec up to 2.3 ft/sec to demo improving functional ambulation. Modified target date 11/08/2014   Status Achieved   PT LONG TERM GOAL #5   Title The patient will negotiate 4 steps modified indep with reciprocal pattern and one HR.   Modified target date 11/08/2014   Baseline on 4/22- pt able to peform stairs with one rail and reciprocal pattern.   Status Achieved  on 11/04/14 pt uses one HR and step over step (reciprocal) and supervision   PT LONG TERM GOAL #6   Title The patient will negotiate level community surfaces without a device x 5 minutes independently for improved community mobility.  Modified target date 11/08/2014   Status Achieved        11/11/14 1449  Plan  Clinical Impression Statement Pt has met goals. Plan to discharge today per PT plan of care.  Pt will benefit from skilled therapeutic intervention in order to improve on the following deficits Decreased balance;Difficulty walking;Postural dysfunction;Decreased strength;Decreased endurance;Decreased activity tolerance;Decreased mobility;Pain;Abnormal gait  Rehab Potential Good  PT Frequency 2x / week  PT Duration 4 weeks  PT Treatment/Interventions Gait  training;Therapeutic exercise;Patient/family education;Balance training;Stair training;Neuromuscular re-education;Therapeutic activities  PT Next Visit Plan Discharge today per PT plan of care.  PT Home Exercise Plan Issued HEP today.  Consulted and Agree with Plan of Care Patient;Family member/caregiver      Problem List Patient Active Problem List   Diagnosis Date Noted  . Spastic hemiplegia affecting nondominant side 07/25/2014  . 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 Left hemiparesis 06/10/2014  . Left-sided neglect 06/10/2014  . 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 Morbid obesity- BMI 40 06/06/2014  . Acute respiratory failure with hypoxia 06/05/2014  . Cerebral infarction due to occlusion of right middle cerebral artery 06/03/2014  . Cerebral infarct 06/03/2014  .  Altered mental status 06/03/2014  . GERD (gastroesophageal reflux disease) 04/12/2014  . Benign essential HTN 02/07/2014  . Dyslipidemia- statin intol 02/07/2014  . Chest pain- Low risk Myoview July 2015 02/06/2014    Willow Ora 11/13/2014, 9:52 PM  Willow Ora, PTA, [facility] [address], [place], [place] [address] [phone] 11/13/2014, 9:52 PM

## 2014-11-16 ENCOUNTER — Encounter: Payer: Self-pay | Admitting: Rehabilitative and Restorative Service Providers"

## 2014-11-16 NOTE — Therapy (Signed)
[facility] [address] [place], [place], [address] Phone: [phone]   Fax:  [phone]  Patient Details  Name: Mary Le MRN: [id number] Date of Birth: 21-Jan-1946 Referring Provider:  No ref. provider found  Encounter Date: 11/16/2014  PHYSICAL THERAPY DISCHARGE SUMMARY  Visits from Start of Care: 20  Current functional level related to goals / functional outcomes:     PT Short Term Goals - 10/06/14 1617    PT SHORT TERM GOAL #1   Title The patient will return demo HEP indep with written cues.  Target date 09/09/2014   Baseline Pt is walking, but not doing regular leg exercises.   Time 4   Period Weeks   Status On-going   PT SHORT TERM GOAL #2   Title The patient will improve Berg score from 27/56 up to 35/56 to demo decreasing risk for falls.  Target date 09/09/2014   Baseline Goal met on 10/04/2014 with patient improving to 39/56.   Time 4   Period Weeks   Status Achieved   PT SHORT TERM GOAL #3   Title The patient will improve gait speed from 1.44 f t/sec up to 1.8 ft/sec to demo decreasing risk for falls.  Target date 09/09/2014   Baseline No device=1.1 ft/sec and with SPC=   Time 4   Period Weeks   Status On-going   PT SHORT TERM GOAL #4   Title The patient will ambulate household surfaces without a device independently x 400 ft for improved independence in the home.  Target date 09/09/2014   Baseline Pt fatigues after 230 ft of gait.   Time 4   Period Weeks   Status On-going   PT SHORT TERM GOAL #5   Title The patient will move sit<>supine to the right and left sides independently.  Target date 09/09/2014   Baseline Per subjective reports from daughter that patient independent with bed mobility.   Time 4   Period Weeks   Status Achieved         PT Long Term Goals - 11/11/14 1449    PT LONG TERM GOAL #1   Title The patient will verbalize understanding of CVA risk factors/warning signs.  TArget  date 10/08/2014   Baseline Reviewed on 10/04/2014   Status Achieved   PT LONG TERM GOAL #2   Title The patient will improve Berg score from 27/56 up to 40/56 to demo decreased risk for falls.  Modified target date 11/08/2014   Baseline Berg=39/56 on 10/04/2014   Status Achieved  Scored 43/56 on 11/04/14   PT LONG TERM GOAL #3   Title The patient will improve SIS: mobility from 33% up to 45%.  Modified target date 11/08/2014   Baseline 4/22- scored 80.6% today.   Status Achieved   PT LONG TERM GOAL #4   Title The patient will improve gait speed from 1.44 ft/sec up to 2.3 ft/sec to demo improving functional ambulation. Modified target date 11/08/2014   Status Achieved   PT LONG TERM GOAL #5   Title The patient will negotiate 4 steps modified indep with reciprocal pattern and one HR.   Modified target date 11/08/2014   Baseline on 4/22- pt able to peform stairs with one rail and reciprocal pattern.   Status Achieved  on 11/04/14 pt uses one HR and step over step (reciprocal) and supervision   PT LONG TERM GOAL #6   Title The patient will negotiate level community surfaces without a device x  5 minutes independently for improved community mobility.  Modified target date 11/08/2014   Status Achieved        Remaining deficits: Fatigue Decreased balance for high level tasks Decreased community mobility   Education / Equipment: HEP to continue strengthening, balance training post d/c.  Plan: Patient agrees to discharge.  Patient goals were met. Patient is being discharged due to meeting the stated rehab goals.  ?????    Thank you for the referral of this patient.             [place], [place] 11/16/2014, 2:58 PM  [facility] [address] [place], [place], [address] Phone: [phone]   Fax:  [phone]

## 2014-11-20 DIAGNOSIS — W19XXXA Unspecified fall, initial encounter: Secondary | ICD-10-CM

## 2014-11-20 HISTORY — DX: Unspecified fall, initial encounter: W19.XXXA

## 2014-11-24 ENCOUNTER — Telehealth: Payer: Self-pay | Admitting: Neurology

## 2014-11-24 ENCOUNTER — Other Ambulatory Visit: Payer: Self-pay | Admitting: *Deleted

## 2014-11-24 DIAGNOSIS — I10 Essential (primary) hypertension: Secondary | ICD-10-CM

## 2014-11-24 MED ORDER — METHOCARBAMOL 500 MG PO TABS: 500.0000 mg | ORAL_TABLET | Freq: Three times a day (TID) | ORAL | Status: DC

## 2014-11-24 MED ORDER — METOPROLOL SUCCINATE ER 25 MG PO TB24: 12.5000 mg | ORAL_TABLET | Freq: Every day | ORAL | Status: DC

## 2014-11-24 NOTE — Telephone Encounter (Signed)
Patients daughter called and requested a refill of Rx. metoprolol succinate (TOPROL-XL) 25 MG 24 hr tablet. Please call and advise. Would like to use [facility].

## 2014-11-24 NOTE — Telephone Encounter (Signed)
Patient's daughter called for a refill on patient's methocarbamol 500 mg tablet.  They would like a 90 day supply sent to [facility] mail order, which is on file. Will send in a 90 day supply for a total of 270 pills with 1 refill

## 2014-11-24 NOTE — Telephone Encounter (Signed)
This was prescribed at last OV on 02/19.  Rx has been sent.  I called back to advise.  Got no answer.  Left message.

## 2014-11-25 ENCOUNTER — Ambulatory Visit (HOSPITAL_BASED_OUTPATIENT_CLINIC_OR_DEPARTMENT_OTHER): Payer: Medicare Other | Admitting: Physical Medicine & Rehabilitation

## 2014-11-25 ENCOUNTER — Encounter: Payer: Medicare Other | Attending: Physical Medicine & Rehabilitation

## 2014-11-25 ENCOUNTER — Encounter: Payer: Self-pay | Admitting: Physical Medicine & Rehabilitation

## 2014-11-25 VITALS — BP 138/60 | HR 89 | Resp 14

## 2014-11-25 DIAGNOSIS — M7502 Adhesive capsulitis of left shoulder: Secondary | ICD-10-CM | POA: Diagnosis not present

## 2014-11-25 DIAGNOSIS — G8110 Spastic hemiplegia affecting unspecified side: Secondary | ICD-10-CM | POA: Insufficient documentation

## 2014-11-25 DIAGNOSIS — I63311 Cerebral infarction due to thrombosis of right middle cerebral artery: Secondary | ICD-10-CM | POA: Insufficient documentation

## 2014-11-25 DIAGNOSIS — R414 Neurologic neglect syndrome: Secondary | ICD-10-CM | POA: Insufficient documentation

## 2014-11-25 DIAGNOSIS — G8114 Spastic hemiplegia affecting left nondominant side: Secondary | ICD-10-CM

## 2014-11-25 NOTE — Progress Notes (Signed)
Botox Injection for spasticity using needle EMG guidance  Dilution: 50 Units/ml Indication: Severe spasticity which interferes with ADL,mobility and/or  hygiene and is unresponsive to medication management and other conservative care Informed consent was obtained after describing risks and benefits of the procedure with the patient. This includes bleeding, bruising, infection, excessive weakness, or medication side effects. A REMS form is on file and signed. Needle: 2" 25g needle electrode Number of units per muscle Pectoralis 100 units Biceps 100 units Brachioradialis 50 units Pronator teres 50 units Flexor digitorum longus 50 units Flexor digitorum superficialis 50 units All injections were done after obtaining appropriate EMG activity and after negative drawback for blood. The patient tolerated the procedure well. Post procedure instructions were given. A followup appointment was made.

## 2014-12-05 ENCOUNTER — Ambulatory Visit (INDEPENDENT_AMBULATORY_CARE_PROVIDER_SITE_OTHER): Payer: Medicare Other | Admitting: *Deleted

## 2014-12-05 DIAGNOSIS — I63411 Cerebral infarction due to embolism of right middle cerebral artery: Secondary | ICD-10-CM

## 2014-12-06 ENCOUNTER — Observation Stay (HOSPITAL_COMMUNITY)
Admission: EM | Admit: 2014-12-06 | Discharge: 2014-12-06 | Disposition: A | Discharge status: Home / Self Care | Payer: Medicare Other | Attending: Family Medicine | Admitting: Family Medicine

## 2014-12-06 ENCOUNTER — Emergency Department (HOSPITAL_COMMUNITY): Payer: Medicare Other

## 2014-12-06 ENCOUNTER — Encounter (HOSPITAL_COMMUNITY): Payer: Self-pay | Admitting: Emergency Medicine

## 2014-12-06 DIAGNOSIS — R109 Unspecified abdominal pain: Secondary | ICD-10-CM

## 2014-12-06 DIAGNOSIS — X58XXXA Exposure to other specified factors, initial encounter: Secondary | ICD-10-CM | POA: Diagnosis not present

## 2014-12-06 DIAGNOSIS — N289 Disorder of kidney and ureter, unspecified: Secondary | ICD-10-CM | POA: Diagnosis not present

## 2014-12-06 DIAGNOSIS — S31119A Laceration without foreign body of abdominal wall, unspecified quadrant without penetration into peritoneal cavity, initial encounter: Secondary | ICD-10-CM

## 2014-12-06 DIAGNOSIS — S31114A Laceration without foreign body of abdominal wall, left lower quadrant without penetration into peritoneal cavity, initial encounter: Secondary | ICD-10-CM | POA: Diagnosis not present

## 2014-12-06 DIAGNOSIS — R55 Syncope and collapse: Secondary | ICD-10-CM | POA: Diagnosis not present

## 2014-12-06 DIAGNOSIS — R1012 Left upper quadrant pain: Secondary | ICD-10-CM | POA: Diagnosis not present

## 2014-12-06 DIAGNOSIS — R404 Transient alteration of awareness: Secondary | ICD-10-CM | POA: Diagnosis not present

## 2014-12-06 DIAGNOSIS — R531 Weakness: Secondary | ICD-10-CM | POA: Diagnosis not present

## 2014-12-06 DIAGNOSIS — R1032 Left lower quadrant pain: Secondary | ICD-10-CM | POA: Diagnosis not present

## 2014-12-06 LAB — CBC WITH DIFFERENTIAL/PLATELET
Basophils Absolute: 0 10*3/uL (ref 0.0–0.1)
Basophils Relative: 0 % (ref 0–1)
Eosinophils Absolute: 0.1 10*3/uL (ref 0.0–0.7)
Eosinophils Relative: 1 % (ref 0–5)
HCT: 49.6 % — ABNORMAL HIGH (ref 36.0–46.0)
Hemoglobin: 16.4 g/dL — ABNORMAL HIGH (ref 12.0–15.0)
Lymphocytes Relative: 12 % (ref 12–46)
Lymphs Abs: 1.5 10*3/uL (ref 0.7–4.0)
MCH: 27.8 pg (ref 26.0–34.0)
MCHC: 33.1 g/dL (ref 30.0–36.0)
MCV: 84.2 fL (ref 78.0–100.0)
Monocytes Absolute: 0.5 10*3/uL (ref 0.1–1.0)
Monocytes Relative: 4 % (ref 3–12)
Neutro Abs: 10.4 10*3/uL — ABNORMAL HIGH (ref 1.7–7.7)
Neutrophils Relative %: 83 % — ABNORMAL HIGH (ref 43–77)
Platelets: 287 10*3/uL (ref 150–400)
RBC: 5.89 MIL/uL — ABNORMAL HIGH (ref 3.87–5.11)
RDW: 14.5 % (ref 11.5–15.5)
WBC: 12.5 10*3/uL — ABNORMAL HIGH (ref 4.0–10.5)

## 2014-12-06 LAB — CUP PACEART REMOTE DEVICE CHECK: MDC IDC SESS DTM: 20160517153014

## 2014-12-06 LAB — COMPREHENSIVE METABOLIC PANEL
ALBUMIN: 3.4 g/dL — AB (ref 3.5–5.0)
ALK PHOS: 59 U/L (ref 38–126)
ALT: 24 U/L (ref 14–54)
ANION GAP: 12 (ref 5–15)
AST: 24 U/L (ref 15–41)
BUN: 11 mg/dL (ref 6–20)
CO2: 20 mmol/L — ABNORMAL LOW (ref 22–32)
Calcium: 9.1 mg/dL (ref 8.9–10.3)
Chloride: 110 mmol/L (ref 101–111)
Creatinine, Ser: 0.84 mg/dL (ref 0.44–1.00)
GFR calc Af Amer: >60 mL/min (ref >60)
GFR calc non Af Amer: >60 mL/min (ref >60)
Glucose, Bld: 92 mg/dL (ref 65–99)
Potassium: 4.3 mmol/L (ref 3.5–5.1)
SODIUM: 142 mmol/L (ref 135–145)
TOTAL PROTEIN: 6.8 g/dL (ref 6.5–8.1)
Total Bilirubin: 0.5 mg/dL (ref 0.3–1.2)

## 2014-12-06 LAB — URINALYSIS, ROUTINE W REFLEX MICROSCOPIC
Bilirubin Urine: NEGATIVE
GLUCOSE, UA: NEGATIVE mg/dL
HGB URINE DIPSTICK: NEGATIVE
Ketones, ur: NEGATIVE mg/dL
Leukocytes, UA: NEGATIVE
Nitrite: NEGATIVE
PH: 6 (ref 5.0–8.0)
Protein, ur: NEGATIVE mg/dL
SPECIFIC GRAVITY, URINE: 1.029 (ref 1.005–1.030)
UROBILINOGEN UA: 0.2 mg/dL (ref 0.0–1.0)

## 2014-12-06 LAB — I-STAT TROPONIN, ED: TROPONIN I, POC: 0.01 ng/mL (ref 0.00–0.08)

## 2014-12-06 MED ORDER — FENTANYL CITRATE (PF) 100 MCG/2ML IJ SOLN
50.0000 ug | Freq: Once | INTRAMUSCULAR | Status: AC
  Administered 2014-12-06: 50 ug via INTRAVENOUS
  Filled 2014-12-06: qty 2

## 2014-12-06 MED ORDER — IOHEXOL 300 MG/ML  SOLN
25.0000 mL | Freq: Once | INTRAMUSCULAR | Status: AC | PRN
  Administered 2014-12-06: 25 mL via ORAL

## 2014-12-06 MED ORDER — SODIUM CHLORIDE 0.9 % IV BOLUS (SEPSIS): 500.0000 mL | Freq: Once | INTRAVENOUS | Status: DC

## 2014-12-06 MED ORDER — LIDOCAINE-EPINEPHRINE 2 %-1:100000 IJ SOLN
20.0000 mL | Freq: Once | INTRAMUSCULAR | Status: AC
  Administered 2014-12-06: 20 mL via INTRADERMAL
  Filled 2014-12-06: qty 20

## 2014-12-06 MED ORDER — SODIUM CHLORIDE 0.9 % IV BOLUS (SEPSIS)
1000.0000 mL | INTRAVENOUS | Status: AC
  Administered 2014-12-06: 1000 mL via INTRAVENOUS

## 2014-12-06 MED ORDER — IOHEXOL 300 MG/ML  SOLN
100.0000 mL | Freq: Once | INTRAMUSCULAR | Status: AC | PRN
  Administered 2014-12-06: 100 mL via INTRAVENOUS

## 2014-12-06 NOTE — ED Provider Notes (Signed)
CSN: [id number]     Arrival date & time 12/06/14  0845 History   First MD Initiated Contact with Patient 12/06/14 [phone]     Chief Complaint  Patient presents with  . Fall     (Consider location/radiation/quality/duration/timing/severity/associated sxs/prior Treatment) Patient is a 69 y.o. female presenting with fall and syncope. The history is provided by the patient.  Fall Pertinent negatives include no chest pain, no abdominal pain, no headaches and no shortness of breath.  Loss of Consciousness Episode history:  Single Most recent episode:  Today Timing: once. Progression:  Resolved Chronicity:  New Context comment:   after getting up from sleep this morning Witnessed: no   Relieved by:  Nothing Worsened by:  Nothing tried Ineffective treatments:  None tried Associated symptoms: no chest pain, no dizziness, no fever, no headaches, no nausea, no shortness of breath and no vomiting     Past Medical History  Diagnosis Date  . Hypertension   . Hypercholesteremia   . Back pain     arthritis  . CVA (cerebral infarction)    Past Surgical History  Procedure Laterality Date  . Appendectomy    . Tonsillectomy    . Hemorroidectomy    . Tubal ligation    . Radiology with anesthesia N/A 06/03/2014    Procedure: RADIOLOGY WITH ANESTHESIA;  Surgeon: [name], MD;  Location: [facility];  Service: Radiology;  Laterality: N/A;  . Tee without cardioversion N/A 06/07/2014    Procedure: TRANSESOPHAGEAL ECHOCARDIOGRAM (TEE);  Surgeon: [name], MD;  Location: [facility] ENDOSCOPY;  Service: Cardiovascular;  Laterality: N/A;  . Loop recorder implant  06/07/2014    MDT LINQ implanted by Dr [name] for cryptogenic stroke  . Loop recorder implant N/A 06/07/2014    Procedure: LOOP RECORDER IMPLANT;  Surgeon: [name], MD;  Location: [facility] CATH LAB;  Service: Cardiovascular;  Laterality: N/A;   Family History  Problem Relation Age of Onset  . Coronary artery disease Mother 15    MI   . Kidney disease Father    History  Substance Use Topics  . Smoking status: Former Smoker -- 5 years  . Smokeless tobacco: Never Used  . Alcohol Use: No   OB History    No data available     Review of Systems  Constitutional: Negative for fever and fatigue.  HENT: Negative for congestion and drooling.   Eyes: Negative for pain.  Respiratory: Negative for cough and shortness of breath.   Cardiovascular: Positive for syncope. Negative for chest pain.  Gastrointestinal: Negative for nausea, vomiting, abdominal pain and diarrhea.  Genitourinary: Negative for dysuria and hematuria.  Musculoskeletal: Negative for back pain, gait problem and neck pain.  Skin: Negative for color change.  Neurological: Positive for syncope. Negative for dizziness and headaches.  Hematological: Negative for adenopathy.  Psychiatric/Behavioral: Negative for behavioral problems.  All other systems reviewed and are negative.     Allergies  Tizanidine; Aspirin; and Statins  Home Medications   Prior to Admission medications   Medication Sig Start Date End Date Taking? Authorizing Provider  acetaminophen (TYLENOL) 325 MG tablet Take 650 mg by mouth every 6 (six) hours as needed (pain).     Historical Provider, MD  clopidogrel (PLAVIX) 75 MG tablet TAKE ONE TABLET BY MOUTH ONCE DAILY 10/24/14   Garvin Fila, MD  clotrimazole (LOTRIMIN) 1 % cream Apply 1 application topically 2 (two) times daily. 09/08/14   [name], MD  famotidine (PEPCID) 10 MG tablet  Take 1 tablet (10 mg total) by mouth 2 (two) times daily. Patient taking differently: Take 10 mg by mouth daily.  06/23/14   Bary Leriche, PA-C  fish oil-omega-3 fatty acids 1000 MG capsule Take 1 g by mouth daily.     Historical Provider, MD  loratadine (ALAVERT) 10 MG tablet Take 10 mg by mouth daily as needed for allergies.    Historical Provider, MD  methocarbamol (ROBAXIN) 500 MG tablet Take 1 tablet (500 mg total) by mouth 3 (three) times  daily. 11/24/14   Charlett Blake, MD  metoprolol succinate (TOPROL-XL) 25 MG 24 hr tablet Take 0.5 tablets (12.5 mg total) by mouth daily. 11/24/14   Garvin Fila, MD  Multiple Vitamin (MULTIVITAMIN WITH MINERALS) TABS tablet Take 1 tablet by mouth daily.    Historical Provider, MD  Polyethyl Glycol-Propyl Glycol (SYSTANE OP) Place 1 drop into both eyes daily as needed (dry eyeys).    Historical Provider, MD  senna-docusate (SENOKOT-S) 8.6-50 MG per tablet Take 3 tablets by mouth 2 (two) times daily. 06/23/14   Bary Leriche, PA-C  traMADol-acetaminophen (ULTRACET) 37.5-325 MG per tablet Take 1 tablet by mouth 2 (two) times daily as needed for moderate pain or severe pain. 10/04/14   Charlett Blake, MD   BP 117/53 mmHg  Pulse 72  Temp(Src) 97.7 F (36.5 C) (Oral)  Resp 14  SpO2 97% Physical Exam  Constitutional: She is oriented to person, place, and time. She appears well-developed and well-nourished.  HENT:  Head: Normocephalic.  Mouth/Throat: No oropharyngeal exudate.  Eyes: Conjunctivae and EOM are normal. Pupils are equal, round, and reactive to light.  Neck: Normal range of motion. Neck supple.   No focal vertebral tenderness.  Cardiovascular: Normal rate, regular rhythm, normal heart sounds and intact distal pulses.  Exam reveals no gallop and no friction rub.   No murmur heard. Pulmonary/Chest: Effort normal and breath sounds normal. No respiratory distress. She has no wheezes.  Abdominal: Soft. Bowel sounds are normal. There is tenderness. There is no rebound and no guarding.   4 cm linear horizontal laceration to the left lower flank. Upon inspection of the wound does seem to be penetrating about 4 cm. Hemostatic.   Musculoskeletal: Normal range of motion. She exhibits no edema or tenderness.   Normal range of motion of bilateral hips. Mild left lateral hip tenderness.  Neurological: She is alert and oriented to person, place, and time.  alert, oriented x3 speech: normal in  context and clarity memory: intact grossly cranial nerves II-XII: slight asymmetry in smile on the left which is unchanged from baseline, otherwise CN's intact motor strength: 5/5 in RUE/RLE, 4/5 in LLE, 3/5 in LUE Sensation: slightly altered sensation to light touch in LUE, LLE, otherwise intact to light touch diffusely  cerebellar: could not perform finger to nose w/ LUE, but otherwise finger-to-nose and heel-to-shin intact gait: ambulates w/ minimal assistance,  Normally ambulatory with a cane.  Skin: Skin is warm and dry.  Psychiatric: She has a normal mood and affect. Her behavior is normal.  Nursing note and vitals reviewed.   ED Course  Procedures (including critical care time) Labs Review Labs Reviewed  CBC WITH DIFFERENTIAL/PLATELET - Abnormal; Notable for the following:    WBC 12.5 (*)    RBC 5.89 (*)    Hemoglobin 16.4 (*)    HCT 49.6 (*)    Neutrophils Relative % 83 (*)    Neutro Abs 10.4 (*)    All other  components within normal limits  COMPREHENSIVE METABOLIC PANEL - Abnormal; Notable for the following:    CO2 20 (*)    Albumin 3.4 (*)    All other components within normal limits  URINALYSIS, ROUTINE W REFLEX MICROSCOPIC  I-STAT TROPOININ, ED    Imaging Review Dg Chest 1 View  12/06/2014   CLINICAL DATA:  69 year old female with a history of syncope.  EXAM: CHEST  1 VIEW  COMPARISON:  Chest x-ray 06/03/2014  FINDINGS: Cardiomediastinal silhouette is unchanged.  Interval placement of implantable Holter monitor on the left chest wall.  No pneumothorax or pleural effusion.  No confluent airspace disease.  No pulmonary vascular congestion.  No displaced fracture.  Endotracheal tube has been removed.  IMPRESSION: No radiographic evidence of acute cardiopulmonary disease.  Interval placement of implantable Holter monitor.  Interval removal of endotracheal tube.  Signed,  Dulcy Fanny. Earleen Newport, DO  [facility]  [facility]    Electronically Signed   By: Corrie Mckusick D.O.   On: 12/06/2014 11:07     EKG Interpretation   Date/Time:  Tuesday Dec 06 2014 08:56:51 EDT Ventricular Rate:  71 PR Interval:  156 QRS Duration: 77 QT Interval:  542 QTC Calculation: 589 R Axis:   -3 Text Interpretation:  Sinus rhythm Borderline T wave abnormalities  Prolonged QT interval Otherwise no significant change Confirmed by  Hady Niemczyk  MD, Dior Dominik ([id number]) on 12/06/2014 9:34:33 AM     Procedure note: Ultrasound Guided Peripheral IV Ultrasound guided 20 g peripheral 1.88 inch angiocath IV placement performed by me. Indications: Nursing unable to place IV. Details: The right antecubital fossa and upper arm was evaluated with a multifrequency linear probe. Several patent brachial veins are noted. 1 attempts were made to cannulate a antecubital vein under realtime US guidance with successful cannulation of the vein and catheter placement. There is return of non-pulsatile dark red blood. The patient tolerated the procedure well without complications,   MDM   Final diagnoses:  Syncope  Left flank pain  Syncope  Left flank pain    9:26 AM 69 y.o. female w hx of CVA (left sided deficits) on plavix  Who presents with a syncopal episode which occurred around 7:30 AM this morning. The patient states that she got up from sleep and walked to the bathroom and syncopized. She fell to the ground and hit her left flank on a plunger  Leading to a 4 cm laceration on her left flank.  This wound does appear to penetrate about 4 cm. She states that after she syncopized her family picked her up and put her on the bed and she felt dizzy and hot for about 2 minutes. Her symptoms have since resolved and she is currently feeling much better. She is afebrile and vital signs are unremarkable here. We'll get screening labs and imaging including CT of abdomen to rule out any significant penetrating injury. She denies any HA, cp, or sob.   Had loop recorder  interrogated. Laceration repaired by PA, see their note.   2:50 PM: I interpreted/reviewed the labs and/or imaging which were non-contributory.  Initial prolonged QT now gone on repeat ecg at end of workup. I had arranged admission to [facility], but family and pt decided they would rather go home and did not want admission. Pt was able to ambulate around the dept. D/c home is reasonable. I have discussed the diagnosis/risks/treatment options with the patient and family and believe the pt to be eligible for discharge  home to follow-up with her pcp this week. We also discussed returning to the ED immediately if new or worsening sx occur. We discussed the sx which are most concerning (e.g., further syncopal episodes, sob, cp, fever, HA) that necessitate immediate return. Medications administered to the patient during their visit and any new prescriptions provided to the patient are listed below.  Medications given during this visit Medications  sodium chloride 0.9 % bolus 1,000 mL (1,000 mLs Intravenous New Bag/Given 12/06/14 1112)  iohexol (OMNIPAQUE) 300 MG/ML solution 25 mL (25 mLs Oral Contrast Given 12/06/14 1000)  fentaNYL (SUBLIMAZE) injection 50 mcg (50 mcg Intravenous Given 12/06/14 1113)  iohexol (OMNIPAQUE) 300 MG/ML solution 100 mL (100 mLs Intravenous Contrast Given 12/06/14 1125)  lidocaine-EPINEPHrine (XYLOCAINE W/EPI) 2 %-1:100000 (with pres) injection 20 mL (20 mLs Intradermal Given 12/06/14 1300)    Discharge Medication List as of 12/06/2014  2:50 PM       Pamella Pert, MD 12/07/14 1220

## 2014-12-06 NOTE — ED Notes (Signed)
IV Ultrasound machine at bedside.  MD Aline Brochure made aware patient is a hard stick and unable to obtain access.

## 2014-12-06 NOTE — ED Notes (Signed)
Patient was ambulatory in the hallway with this RN and family.  Patient had stable, steady gait during ambulation.  Patient denied feeling dizzy or lightheaded.  MD Aline Brochure made aware.

## 2014-12-06 NOTE — ED Notes (Signed)
Patient transported to CT 

## 2014-12-06 NOTE — ED Notes (Signed)
Phlebotomy at bedside.

## 2014-12-06 NOTE — ED Notes (Signed)
MD Harrison at bedside. 

## 2014-12-06 NOTE — ED Notes (Signed)
RN Charge, [place] made aware patients Loop recorder needs to be interrogated.

## 2014-12-06 NOTE — ED Provider Notes (Signed)
Asked by Dr. Aline Brochure repair the patient's laceration.  LACERATION REPAIR Performed by: Montine Circle Authorized by: Montine Circle Consent: Verbal consent obtained. Risks and benefits: risks, benefits and alternatives were discussed Consent given by: patient Patient identity confirmed: provided demographic data Prepped and Draped in normal sterile fashion Wound explored  Laceration Location: Left flank  Laceration Length: 4 cm  No Foreign Bodies seen or palpated  Anesthesia: local infiltration  Local anesthetic: lidocaine 2 % with epinephrine  Anesthetic total: 5 ml  Irrigation method: syringe Amount of cleaning: Copiously irrigated   Skin closure: 3-0 prolene   Number of sutures: 2   Technique: Horizontal mattress   Patient tolerance: Patient tolerated the procedure well with no immediate complications.   Montine Circle, PA-C 12/06/14 [place], MD 12/06/14 [phone]

## 2014-12-06 NOTE — ED Notes (Signed)
EMS - Patient was at home going in to the bathroom when the patient thinks she either tripped or slipped on something in the bathroom (tile surface).  Patient denies hitting head in the bathroom and no neck or back pain.  Patient was assisted to the bedroom by husband when the patient had a 1-2 minute episode of dizziness and LOC.  Patient has a laceration to the left lower side.  History of left sided weakness from a stroke in Nov. '15.  138/54, 72 HR, 97% room air, and 117 CBG.

## 2014-12-09 NOTE — Progress Notes (Signed)
Loop recorder 

## 2014-12-15 ENCOUNTER — Encounter: Payer: Self-pay | Admitting: Neurology

## 2014-12-15 ENCOUNTER — Ambulatory Visit (INDEPENDENT_AMBULATORY_CARE_PROVIDER_SITE_OTHER): Payer: Medicare Other | Admitting: Neurology

## 2014-12-15 VITALS — BP 150/88 | HR 80 | Wt 193.2 lb

## 2014-12-15 DIAGNOSIS — I639 Cerebral infarction, unspecified: Secondary | ICD-10-CM | POA: Diagnosis not present

## 2014-12-15 DIAGNOSIS — E785 Hyperlipidemia, unspecified: Secondary | ICD-10-CM | POA: Diagnosis not present

## 2014-12-15 DIAGNOSIS — G8112 Spastic hemiplegia affecting left dominant side: Secondary | ICD-10-CM

## 2014-12-15 DIAGNOSIS — E0949 Drug or chemical induced diabetes mellitus with neurological complications with other diabetic neurological complication: Secondary | ICD-10-CM

## 2014-12-15 DIAGNOSIS — W19XXXD Unspecified fall, subsequent encounter: Secondary | ICD-10-CM

## 2014-12-15 NOTE — Patient Instructions (Signed)
I had a long d/w patient about her remote stroke, risk for recurrent stroke/TIAs, personally independently reviewed imaging studies and stroke evaluation results and answered questions.Continue Plavix  for secondary stroke prevention and maintain strict control of hypertension with blood pressure goal below 130/90, diabetes with hemoglobin A1c goal below 6.5% and lipids with LDL cholesterol goal below 70 mg/dL. check follow-up carotid and transcranial Doppler studies. Refer to physical therapy for gait and balance training. Patient was advised fall prevention precautions. Check fasting lipid profile and hemoglobin A1c. Followup in the future with me in 6 months or call earlier if necessary.  Fall Prevention and Home Safety Falls cause injuries and can affect all age groups. It is possible to use preventive measures to significantly decrease the likelihood of falls. There are many simple measures which can make your home safer and prevent falls. OUTDOORS  Repair cracks and edges of walkways and driveways.  Remove high doorway thresholds.  Trim shrubbery on the main path into your home.  Have good outside lighting.  Clear walkways of tools, rocks, debris, and clutter.  Check that handrails are not broken and are securely fastened. Both sides of steps should have handrails.  Have leaves, snow, and ice cleared regularly.  Use sand or salt on walkways during winter months.  In the garage, clean up grease or oil spills. BATHROOM  Install night lights.  Install grab bars by the toilet and in the tub and shower.  Use non-skid mats or decals in the tub or shower.  Place a plastic non-slip stool in the shower to sit on, if needed.  Keep floors dry and clean up all water on the floor immediately.  Remove soap buildup in the tub or shower on a regular basis.  Secure bath mats with non-slip, double-sided rug tape.  Remove throw rugs and tripping hazards from the floors. BEDROOMS  Install  night lights.  Make sure a bedside light is easy to reach.  Do not use oversized bedding.  Keep a telephone by your bedside.  Have a firm chair with side arms to use for getting dressed.  Remove throw rugs and tripping hazards from the floor. KITCHEN  Keep handles on pots and pans turned toward the center of the stove. Use back burners when possible.  Clean up spills quickly and allow time for drying.  Avoid walking on wet floors.  Avoid hot utensils and knives.  Position shelves so they are not too high or low.  Place commonly used objects within easy reach.  If necessary, use a sturdy step stool with a grab bar when reaching.  Keep electrical cables out of the way.  Do not use floor polish or wax that makes floors slippery. If you must use wax, use non-skid floor wax.  Remove throw rugs and tripping hazards from the floor. STAIRWAYS  Never leave objects on stairs.  Place handrails on both sides of stairways and use them. Fix any loose handrails. Make sure handrails on both sides of the stairways are as long as the stairs.  Check carpeting to make sure it is firmly attached along stairs. Make repairs to worn or loose carpet promptly.  Avoid placing throw rugs at the top or bottom of stairways, or properly secure the rug with carpet tape to prevent slippage. Get rid of throw rugs, if possible.  Have an electrician put in a light switch at the top and bottom of the stairs. OTHER FALL PREVENTION TIPS  Wear low-heel or rubber-soled shoes that  are supportive and fit well. Wear closed toe shoes.  When using a stepladder, make sure it is fully opened and both spreaders are firmly locked. Do not climb a closed stepladder.  Add color or contrast paint or tape to grab bars and handrails in your home. Place contrasting color strips on first and last steps.  Learn and use mobility aids as needed. Install an electrical emergency response system.  Turn on lights to avoid dark  areas. Replace light bulbs that burn out immediately. Get light switches that glow.  Arrange furniture to create clear pathways. Keep furniture in the same place.  Firmly attach carpet with non-skid or double-sided tape.  Eliminate uneven floor surfaces.  Select a carpet pattern that does not visually hide the edge of steps.  Be aware of all pets. OTHER HOME SAFETY TIPS  Set the water temperature for 120 F (48.8 C).  Keep emergency numbers on or near the telephone.  Keep smoke detectors on every level of the home and near sleeping areas. Document Released: 06/28/2002 Document Revised: 01/07/2012 Document Reviewed: 09/27/2011 [facility] Patient Information 2015 [place], [place]. This information is not intended to replace advice given to you by your health care provider. Make sure you discuss any questions you have with your health care provider.

## 2014-12-15 NOTE — Progress Notes (Signed)
[facility] [address] [place]. [place] [address] [phone]       OFFICE FOLLOW-UP NOTE  [name] Date of Birth:  1946-03-11 Medical Record Number:  [id number]   HPI: 66 year African American ladywho awakened normal on 06/03/14. Went to have an MRI and returned normal. She did complain of not feeling well and laid down for a nap. When her family went to awaken her they noted a left facial droop and left sided weakness. EMS was called at that time and the patient was brought in as a code stroke. Initial NIHSS of 18.presenting with left hemiplegia, left neglect and HH, right gaze. NIH stroke scale on admission was 18. She did not receive IV t-PA due to out of window. CTA showed right M1 cut off and Dr Estanislado Pandy neuroradiologist in IR performed ENDOVASCULAR COMPLETE REVASCULARIZATION OF OCCLUDED RIGHT MIDDLE CEREBRAL ARTERY DOMINANT INFERIOR DIVISION USING SUPERSELECTIVE INTRA-ARTERIAL INTRACRANIAL INTEGRELIN AND 2 PASSES WITH THE SOLITAIRE FR Fuig was monitored in intensive care unit and pulmonary critical care followed her initially as primary attending. She was subsequently extubated and found to falling commands though left upper extremity weakness and plegia persisted. Left lower extremity strength improved to 4/5. Patient's prep was tightly controlled. Postprocedure brain imaging did not reveal significant hemorrhagic transformation. Patient notes was transferred to the neurology floor where she made steady progress. She was seen by physical occupational and speech therapy. Transthoracic echo showed no significant cardiac source of embolism. TEE also showed no cardiac source of embolism. Patient had LEt venous Doppler which was negative for DVT. She had loop recorder placed to look for paroxysmal atrial fibrillation. She was felt to be a good candidate for inpatient rehabilitation and hence was transferred there in a stable condition on  06/08/14. Her neurological deficits on day of discharge included mild left facial droop but no dysarthria. Left upper extremity was plegic 0/5. There was very mild weakness of the left lower extremity without any drift. She was started on aspirin. She has subsequently been switched to Plavix for GI upset . She has made steady progress with outpatient physical and occupational therapy and is now able to move the left arm above the shoulder but has no useful function in the left fingers. She has been getting Botox injections by Dr. Barbaraann Cao which seems to have helped her pain and spasticity. She however still needs a lot of help from her daughter for bathing, using the bathroom, cocaine and changing her clothes. She is able to ambulate with a cane and has had no recent falls. Her speech and swallowing have improved. She has not had any recurrent stroke or TIA symptoms Update 12/15/2014 : She returns for follow-up after last visit 4 months ago. She is accompanied by her daughter. Patient had a fall in the bathroom a month ago and needed several stitches. She states she slipped in the bathroom. She is walking with a cane and does reasonably well most of the time. She still has pain and spasticity in the left shoulder and arm for which she gets Botox injections by Dr. Dianna Limbo which seem to help her. She remains on Plavix which is tolerating well without bleeding or bruising. She states her blood pressure is well controlled though it is slightly elevated today at 150/88 in office. She has had no recurrent stroke or TIA symptoms but feels that after her fall her gait and balance is slightly off and on not to back to baseline. She would  like to be referred back to physical therapy for gait and balance training. She has not had any recent lipid profile checked but remains on fish oil. ROS:   14 system review of systems is positive for eye itching, constipation, joint pain, weakness, gait difficulty, arm pain, fall and  all the systems negative. PMH:  Past Medical History  Diagnosis Date  . Hypertension   . Hypercholesteremia   . Back pain     arthritis  . CVA (cerebral infarction)   . Fall 11/2014    fell in bathroom, uses a cane  . Joint pain     back, history of   . Weakness     Social History:  History   Social History  . Marital Status: Married    Spouse Name: N/A  . Number of Children: N/A  . Years of Education: N/A   Occupational History  . Not on file.   Social History Main Topics  . Smoking status: Former Smoker -- 5 years  . Smokeless tobacco: Never Used  . Alcohol Use: No  . Drug Use: No  . Sexual Activity: Not on file   Other Topics Concern  . Not on file   Social History Narrative   Lives in [place]    Married. Education: [employer].     Medications:   Current Outpatient Prescriptions on File Prior to Visit  Medication Sig Dispense Refill  . acetaminophen (TYLENOL) 325 MG tablet Take 650 mg by mouth every 6 (six) hours as needed (pain).     Marland Kitchen clopidogrel (PLAVIX) 75 MG tablet TAKE ONE TABLET BY MOUTH ONCE DAILY 90 tablet 1  . clotrimazole (LOTRIMIN) 1 % cream Apply 1 application topically 2 (two) times daily. 60 g 1  . famotidine (PEPCID) 10 MG tablet Take 1 tablet (10 mg total) by mouth 2 (two) times daily. (Patient taking differently: Take 10 mg by mouth daily as needed. ) 60 tablet 1  . fish oil-omega-3 fatty acids 1000 MG capsule Take 1 g by mouth 2 (two) times daily.     Marland Kitchen loratadine (ALAVERT) 10 MG tablet Take 10 mg by mouth daily as needed for allergies.    . methocarbamol (ROBAXIN) 500 MG tablet Take 1 tablet (500 mg total) by mouth 3 (three) times daily. (Patient taking differently: Take 500 mg by mouth 2 (two) times daily. ) 270 tablet 1  . metoprolol succinate (TOPROL-XL) 25 MG 24 hr tablet Take 0.5 tablets (12.5 mg total) by mouth daily. 45 tablet 0  . Multiple Vitamin (MULTIVITAMIN WITH MINERALS) TABS tablet Take 1 tablet by mouth daily.    Vladimir Faster  Glycol-Propyl Glycol (SYSTANE OP) Place 1 drop into both eyes daily as needed (dry eyeys).    Marland Kitchen senna-docusate (SENOKOT-S) 8.6-50 MG per tablet Take 3 tablets by mouth 2 (two) times daily. (Patient taking differently: Take 2 tablets by mouth 2 (two) times daily. ) 100 tablet 1  . traMADol-acetaminophen (ULTRACET) 37.5-325 MG per tablet Take 1 tablet by mouth 2 (two) times daily as needed for moderate pain or severe pain. 60 tablet 2   No current facility-administered medications on file prior to visit.    Allergies:   Allergies  Allergen Reactions  . Tizanidine Itching  . Aspirin Nausea And Vomiting    Tolerated baby aspirin, but with more than once per day of full strength for aches and pains - had stomach upset.  No history of PUD/gastric bleeding known.   . Statins Other (See Comments)  Myalgias and chest pain (has tried Lipitor and Pravachol)    Physical Exam General: well developed, well nourished middle aged African-American lady, seated, in no evident distress Head: head normocephalic and atraumatic.  Neck: supple with no carotid or supraclavicular bruits Cardiovascular: regular rate and rhythm, no murmurs Musculoskeletal: no deformity Skin:  no rash/petichiae Vascular:  Normal pulses all extremities Filed Vitals:   12/15/14 1313  BP: 150/88  Pulse: 80   Neurologic Exam Mental Status: Awake and fully alert. Oriented to place and time. Recent and remote memory intact. Attention span, concentration and fund of knowledge appropriate. Mood and affect appropriate.  Cranial Nerves: Fundoscopic exam  Not done.   Pupils equal, briskly reactive to light. Extraocular movements full without nystagmus. Visual fields full to confrontation. Hearing intact. Facial sensation intact. Mild left lower facial weakness. Tongue, palate moves normally and symmetrically.  Motor: Spastic left hemiparesis with 3/5 proximal left approximately and 0/5 distal left upper extremity strength. 4+/5 left  lower extremity strength with mild weakness of ankle dorsiflexors and hip flexors. Increased tone on the left with spasticity at the left shoulder and elbow extensors. Sensory.: intact to touch ,pinprick .position and vibratory sensation.  Coordination: Impaired on the left due to weakness and normal on the right  Gait and Station:Spastic hemiplegic gait with circumduction of the left foot. DTRs: 2+ and asymmetric brisker on the left. Toes downgoing.   NIHSS  4 Modified Rankin  3   ASSESSMENT: 52 year African-American lady with embolic right middle cerebral artery cerebral infarction due to occlusion of right middle cerebral artery of embolic etiology without definite identified source s/p endovascular revascularization of an occluded right middle cerebral artery and dominant inferior division using superselective intra-arterial intracranial Integrilin and 2 passes of solitaire Fr stent retrieval device in November 2015   Patient has made a modest recovery with residual spastic left hemiplegia. Vascular risk factors of hypertension and hyperlipidemia.    PLAN:  I had a long d/w patient about her remote stroke, risk for recurrent stroke/TIAs, personally independently reviewed imaging studies and stroke evaluation results and answered questions.Continue Plavix  for secondary stroke prevention and maintain strict control of hypertension with blood pressure goal below 130/90, diabetes with hemoglobin A1c goal below 6.5% and lipids with LDL cholesterol goal below 70 mg/dL. check follow-up carotid and transcranial Doppler studies. Refer to physical therapy for gait and balance training. Patient was advised fall prevention precautions. Check fasting lipid profile and hemoglobin A1c. Followup in the future with me in 6 months or call earlier if necessary. Antony Contras, MD Note: This document was prepared with digital dictation and possible smart phrase technology. Any transcriptional errors that result  from this process are unintentional

## 2014-12-21 ENCOUNTER — Telehealth: Payer: Self-pay

## 2014-12-21 LAB — CUP PACEART REMOTE DEVICE CHECK
Date Time Interrogation Session: 20160517125129
Zone Setting Detection Interval: 2000 ms
Zone Setting Detection Interval: 3000 ms
Zone Setting Detection Interval: 370 ms

## 2014-12-21 NOTE — Telephone Encounter (Signed)
VM left to inform patient to call office to schedule appointment for [place] TCD Complete.   She has been asked to call office back

## 2014-12-22 ENCOUNTER — Encounter: Payer: Self-pay | Admitting: Internal Medicine

## 2014-12-28 ENCOUNTER — Ambulatory Visit: Payer: Medicare Other | Admitting: Physical Therapy

## 2014-12-28 ENCOUNTER — Ambulatory Visit: Payer: Medicare Other | Attending: Physical Medicine & Rehabilitation | Admitting: Occupational Therapy

## 2014-12-28 ENCOUNTER — Encounter: Payer: Self-pay | Admitting: Physical Therapy

## 2014-12-28 DIAGNOSIS — G8114 Spastic hemiplegia affecting left nondominant side: Secondary | ICD-10-CM | POA: Diagnosis not present

## 2014-12-28 DIAGNOSIS — M25512 Pain in left shoulder: Secondary | ICD-10-CM | POA: Diagnosis not present

## 2014-12-28 DIAGNOSIS — R4189 Other symptoms and signs involving cognitive functions and awareness: Secondary | ICD-10-CM | POA: Insufficient documentation

## 2014-12-28 DIAGNOSIS — Z7409 Other reduced mobility: Secondary | ICD-10-CM | POA: Diagnosis not present

## 2014-12-28 DIAGNOSIS — H539 Unspecified visual disturbance: Secondary | ICD-10-CM | POA: Diagnosis not present

## 2014-12-28 DIAGNOSIS — R531 Weakness: Secondary | ICD-10-CM | POA: Diagnosis not present

## 2014-12-28 DIAGNOSIS — I63411 Cerebral infarction due to embolism of right middle cerebral artery: Secondary | ICD-10-CM | POA: Diagnosis not present

## 2014-12-28 DIAGNOSIS — I69898 Other sequelae of other cerebrovascular disease: Secondary | ICD-10-CM | POA: Insufficient documentation

## 2014-12-28 DIAGNOSIS — R269 Unspecified abnormalities of gait and mobility: Secondary | ICD-10-CM | POA: Diagnosis not present

## 2014-12-28 DIAGNOSIS — R293 Abnormal posture: Secondary | ICD-10-CM | POA: Insufficient documentation

## 2014-12-28 NOTE — Telephone Encounter (Signed)
Calling patient to schedule US carotid Bilateral and TCD [place], patients contact number is disconnected.

## 2014-12-28 NOTE — Therapy (Signed)
[facility] [address] [place], [place], [address] Phone: [phone]   Fax:  [phone]  Occupational Therapy Evaluation  Patient Details  Name: Mary Le MRN: [id number] Date of Birth: 03-29-1946 Referring Provider:  Charlett Blake, MD  Encounter Date: 12/28/2014      OT End of Session - 12/28/14 1544    Visit Number 1   Number of Visits 17   Date for OT Re-Evaluation 02/25/15   Authorization Type medicare - needs G code!   Authorization Time Period 60 days ( anticipate d/c after 6 weeks depending on progress)   Authorization - Visit Number 1   Authorization - Number of Visits 10   OT Start Time 1534   OT Stop Time 1615   OT Time Calculation (min) 41 min   Activity Tolerance Patient tolerated treatment well   Behavior During Therapy WFL for tasks assessed/performed      Past Medical History  Diagnosis Date  . Hypertension   . Hypercholesteremia   . Back pain     arthritis  . CVA (cerebral infarction)   . Fall 11/2014    fell in bathroom, uses a cane  . Joint pain     back, history of   . Weakness     Past Surgical History  Procedure Laterality Date  . Appendectomy    . Tonsillectomy    . Hemorroidectomy    . Tubal ligation    . Radiology with anesthesia N/A 06/03/2014    Procedure: RADIOLOGY WITH ANESTHESIA;  Surgeon: [name], MD;  Location: [facility];  Service: Radiology;  Laterality: N/A;  . Tee without cardioversion N/A 06/07/2014    Procedure: TRANSESOPHAGEAL ECHOCARDIOGRAM (TEE);  Surgeon: [name], MD;  Location: [facility] ENDOSCOPY;  Service: Cardiovascular;  Laterality: N/A;  . Loop recorder implant  06/07/2014    MDT LINQ implanted by Dr [name] for cryptogenic stroke  . Loop recorder implant N/A 06/07/2014    Procedure: LOOP RECORDER IMPLANT;  Surgeon: [name], MD;  Location: [facility] CATH LAB;  Service: Cardiovascular;  Laterality: N/A;    There were no vitals filed  for this visit.  Visit Diagnosis:  Spastic hemiplegia affecting left nondominant side  Pain in joint, shoulder region, left  Impaired cognition  Impaired functional mobility and activity tolerance  Impaired visual perception      Subjective Assessment - 12/28/14 1542    Subjective  Pt /sp CVA Novemever 2015, underwent botox injection in early May.   Patient is accompained by: Family member   Pertinent History see epic snapshot;  HTN, fluctuating BP since stroke   Patient Stated Goals to get my hand working, button clothes   Currently in Pain? Yes   Pain Score 3    Pain Location Arm   Pain Orientation Left   Pain Descriptors / Indicators Jabbing   Pain Type Neuropathic pain   Pain Onset More than a month ago   Pain Frequency Intermittent   Aggravating Factors  unknown   Pain Relieving Factors medicine   Multiple Pain Sites No           [facility] OT Assessment - 12/28/14 1546    Assessment   Diagnosis R MCA CVA   Onset Date 06/04/15   Assessment Pt s/p CVA in November was previoulsy seen by O.T. Pt returns s/p recent botox injections in early and and decline in functional mobility/ activity tolerance s/p recent fall.   Prior Therapy outpatient rehab  Precautions   Precautions Fall   Restrictions   Weight Bearing Restrictions No   Balance Screen   Has the patient fallen in the past 6 months Yes   How many times? 2   Has the patient had a decrease in activity level because of a fear of falling?  Yes   Is the patient reluctant to leave their home because of a fear of falling?  No   Home  Environment   Family/patient expects to be discharged to: Private residence   Living Arrangements Spouse/significant other   Available Help at Discharge Family   Type of [place] One level   Lives With Spouse   Prior Function   Level of Independence Needs assistance with ADLs;Needs assistance with homemaking   ADL   Eating/Feeding Set up    Grooming Set up   Upper Body Bathing Supervision/safety   Lower Body Bathing Supervision/safety   Upper Body Dressing Minimal assistance  dependent for bra, min a shirt   Lower Body Dressing Moderate assistance   Toilet Tranfer Independent  with cane   Tub/Shower Transfer Min guard   IADL   Shopping Assistance for transportation;Needs to be accompanied on any shopping trip   Light Housekeeping Performs light daily tasks but cannot maintain acceptable level of cleanliness  Pt fixes her own cereal   Meal Prep Able to complete simple cold meal and snack prep  has not been cooking   Education officer, environmental on family or friends for transportation   Mobility   Mobility Status History of falls   Mobility Status Comments supervision to modified indpendent   Written Expression   Dominant Hand Right   Vision - History   Baseline Vision Wears glasses for distance only   Vision Assessment   Vision Assessment Vision not tested   Activity Tolerance   Activity Tolerance Tolerates < 10 min activity with changes in vital signs  Tolerates grossly < 10 mins in standing prior to rest   Cognition   Overall Cognitive Status Impaired/Different from baseline  not formally tested   Sensation   Light Touch Impaired by gross assessment   Coordination   Gross Motor Movements are Fluid and Coordinated No   Fine Motor Movements are Fluid and Coordinated Not tested   Coordination and Movement Description no consitent movement in left hand, only trace index finger movement   Box and Blocks unable   Perception   Inattention/Neglect --  decreased attention to left side of body   Tone   Assessment Location Left Upper Extremity   ROM / Strength   AROM / PROM / Strength AROM   AROM   Overall AROM  Deficits  uses LUE less than 5% x to assist with ADLS   Right/Left Shoulder Left   Left Shoulder Flexion 48 Degrees   Left Shoulder ABduction 50 Degrees   Right/Left Elbow Left   Left Elbow Flexion 78    Left Elbow Extension -35   PROM   Overall PROM  Deficits   Overall PROM Comments finger flexion 90%, extension 80% passively, supination grossly 60%  shoulder P/ROM grossly 80 seated   Strength   Overall Strength Deficits   Left Hand Gross Grasp Impaired  no functional use left hand,    LUE Tone   LUE Tone Hypertonic;Moderate   LUE Tone   Hypertonic Details throughout LUE  OT Short Term Goals - Jan 16, 2015 1645    OT SHORT TERM GOAL #1   Title I with updated HEP   Baseline due 12/27/14   Period Weeks   Status New   OT SHORT TERM GOAL #2   Title Pt will donn shirt with setup consistently   Time 4   Period Weeks   Status New   OT SHORT TERM GOAL #3   Title Pt will incorporate LUE into ADLS/ IADLS at least 75% of the time as a stabilizer/ gross A with pain less than or equal to 3/10   Time 4   Period Weeks   Status New                                   ---------------------------------------           OT Long Term Goals - January 16, 2015 1648    OT LONG TERM GOAL #1   Title Pt will demonstrate ability to stand modified indpendently for light home management/ cooking x 10 mins without LOB or rest break   Baseline due 01/25/12   Status New   OT LONG TERM GOAL #2   Title Pt will increase A/ROM shoulder flexion to 60* consistently for increased ease with bathing and dressing.   Time 8   Period Weeks   Status New   OT LONG TERM GOAL #3   Title Pt will demonstrate A/ROM elbow extension to -25 for increased ease with ADLs.   Time 8   Period Weeks   Status New   OT LONG TERM GOAL #4   Title Pt will donn pants with set up only on a consistent basis.   Time 8   Period Weeks   Status New                                                  Plan - 01/16/15 1637    Clinical Impression Statement Pt s/p CVA in November 2015, underwent botox injections on 11/25/14 to LUE due to spasticity. Pt was previously seen by O.T yet  she returnes to therapy s/p botox and s/p recnt fall with a decline in overall activity tolerance and home management.   Pt will benefit from skilled therapeutic intervention in order to improve on the following deficits (Retired) Decreased activity tolerance;Decreased balance;Decreased cognition;Decreased coordination;Decreased range of motion;Decreased safety awareness;Decreased mobility;Decreased knowledge of use of DME;Decreased strength;Increased edema;Impaired UE functional use;Impaired tone;Pain;Impaired sensation;Impaired perceived functional ability;Improper body mechanics;Obesity   Clinical Impairments Affecting Rehab Potential pain, spasticity, decreased balance, decrease activity tolerance   OT Frequency 2x / week   OT Duration 8 weeks   OT Treatment/Interventions Self-care/ADL training;Ultrasound;Moist Heat;Fluidtherapy;Therapeutic exercise;Neuromuscular education;Energy conservation;Manual Therapy;Functional Mobility Training;DME and/or AE instruction;Passive range of motion;Visual/perceptual remediation/compensation;Cognitive remediation/compensation;Therapeutic activities;Splinting;Patient/family education;Balance training   Plan initiate updated stretching / weightbearing HEP, light home management with LUE as gross A/ stabilizer   Consulted and Agree with Plan of Care Patient   Family Member Consulted daughter          G-Codes - 01/16/2015 1641    Functional Assessment Tool Used Pt requires mod A for donning shirt, underpants and pants, incorporates LUE less than 5% of the time.   Functional Limitation Self care   Self Care Current Status [phone]) At least  40 percent but less than 60 percent impaired, limited or restricted   Self Care Goal Status ([id number]) At least 20 percent but less than 40 percent impaired, limited or restricted      Problem List Patient Active Problem List   Diagnosis Date Noted  . Syncope 12/06/2014  . Spastic hemiplegia affecting nondominant side  07/25/2014  . 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 Left hemiparesis 06/10/2014  . Left-sided neglect 06/10/2014  . 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 Morbid obesity- BMI 40 06/06/2014  . Acute respiratory failure with hypoxia 06/05/2014  . Cerebral infarction due to occlusion of right middle cerebral artery 06/03/2014  . Cerebral infarct 06/03/2014  . Altered mental status 06/03/2014  . GERD (gastroesophageal reflux disease) 04/12/2014  . Benign essential HTN 02/07/2014  . Dyslipidemia- statin intol 02/07/2014  . Chest pain- Low risk Myoview July 2015 02/06/2014    RINE,KATHRYN 12/28/2014, 5:13 PM Theone Murdoch, OTR/L [phone] Phone: [phone] 5:13 PM 12/28/2014  [facility] [address] [place], [place], [address] Phone: [phone]   Fax:  [phone]

## 2014-12-28 NOTE — Therapy (Signed)
[facility] [address] [place], [place], [address] Phone: [phone]   Fax:  [phone]  Physical Therapy Evaluation  Patient Details  Name: Mary Le MRN: [id number] Date of Birth: [birth year] Referring Provider:  Garvin Fila, MD  Encounter Date: 12/28/2014      PT End of Session - 12/28/14 2242    Visit Number 1   Number of Visits 17   Date for PT Re-Evaluation 02/26/15   Authorization Type Medicare - G Codes required every 10 visits   PT Start Time 1450   PT Stop Time 1530   PT Time Calculation (min) 40 min   Equipment Utilized During Treatment Gait belt   Activity Tolerance Patient tolerated treatment well   Behavior During Therapy [facility] for tasks assessed/performed      Past Medical History  Diagnosis Date  . Hypertension   . Hypercholesteremia   . Back pain     arthritis  . CVA (cerebral infarction)   . Fall 11/2014    fell in bathroom, uses a cane  . Joint pain     back, history of   . Weakness     Past Surgical History  Procedure Laterality Date  . Appendectomy    . Tonsillectomy    . Hemorroidectomy    . Tubal ligation    . Radiology with anesthesia N/A 06/03/2014    Procedure: RADIOLOGY WITH ANESTHESIA;  Surgeon: [name], MD;  Location: [facility];  Service: Radiology;  Laterality: N/A;  . Tee without cardioversion N/A 06/07/2014    Procedure: TRANSESOPHAGEAL ECHOCARDIOGRAM (TEE);  Surgeon: [name], MD;  Location: [facility] ENDOSCOPY;  Service: Cardiovascular;  Laterality: N/A;  . Loop recorder implant  06/07/2014    MDT LINQ implanted by Dr [name] for cryptogenic stroke  . Loop recorder implant N/A 06/07/2014    Procedure: LOOP RECORDER IMPLANT;  Surgeon: [name], MD;  Location: [facility] CATH LAB;  Service: Cardiovascular;  Laterality: N/A;    There were no vitals filed for this visit.  Visit Diagnosis:  Spastic hemiplegia affecting left nondominant side  Impaired  functional mobility and activity tolerance  Abnormality of gait      Subjective Assessment - 12/28/14 1459    Subjective Pt reports having sustained a fall 11/29/14. Pt believes she may have lost consciousness. Pt had sutures on L lower abdomen. Following said fall, pt believes she has had functional decline. Pt/daughter report less confidence in stability,  difficulty getting into/out of car, forward flexed posture, decreased endurance, and decreased stability with multitasking. Since sustaining fall, pt reports dizziness (MD aware) which increases with "looking too far ahead" while walking, per patient. Increased soft tissue swelling on LLE.   Patient is accompained by: Family member  daughter, [name]   Currently in Pain? No/denies            [facility] PT Assessment - 12/28/14 0001    Assessment   Medical Diagnosis spastic hemiplegia affecting L dominant side   Onset Date/Surgical Date 01/15/15   Hand Dominance Right   Next MD Visit December 2016  with Dr. Leonie Man   Prior Therapy Yes; D/C'ed from [place] on 11/16/14   Precautions   Precautions Fall   Restrictions   Weight Bearing Restrictions No   Balance Screen   Has the patient fallen in the past 6 months Yes   How many times? 4   Has the patient had a decrease in activity level because of a fear  of falling?  Yes   Is the patient reluctant to leave their home because of a fear of falling?  No   Home Social worker Private residence   Living Arrangements Spouse/significant other   Type of [place] to enter   Entrance Stairs-Number of Steps 3   Entrance Stairs-Rails Right   [employer] One level   [place] - quad;Cane - single point;Wheelchair - manual;Shower seat - built in;Toilet riser   Prior Function   Level of Independence Requires assistive device for independence;Needs assistance with ADLs;Needs assistance with homemaking;Independent with transfers   Sensation   Light  Touch Impaired by gross assessment   Proprioception Impaired by gross assessment   Coordination   Gross Motor Movements are Fluid and Coordinated No   Strength   Overall Strength Deficits   Left Hand Gross Grasp Impaired   Transfers   Transfers Sit to Stand;Stand to Sit;Stand Pivot Transfers   Sit to Stand 5: Supervision;4: Min guard   Stand to Sit 5: Supervision   Stand Pivot Transfers 4: Min guard;5: Supervision   Ambulation/Gait   Ambulation/Gait Yes   Ambulation/Gait Assistance 4: Min guard;4: Min assist   Ambulation/Gait Assistance Details Min A to recover from single L toe catch   Ambulation Distance (Feet) 130 Feet   Assistive device Straight cane   Gait Pattern Step-to pattern;Lateral trunk lean to right;Poor foot clearance - left;Trunk rotated posteriorly on left;Left circumduction;Decreased stance time - left;Decreased step length - right   Ambulation Surface Level;Indoor   Gait velocity 1.17 ft/sec  using SPC   Berg Balance Test   Sit to Stand Able to stand using hands after several tries   Standing Unsupported Able to stand safely 2 minutes   Sitting with Back Unsupported but Feet Supported on Floor or Stool Able to sit safely and securely 2 minutes   Stand to Sit Sits safely with minimal use of hands   Transfers Able to transfer safely, definite need of hands   Standing Unsupported with Eyes Closed Able to stand 10 seconds safely   Standing Ubsupported with Feet Together Able to place feet together independently and stand for 1 minute with supervision  Feet together limited by body habitus   From Standing, Reach Forward with Outstretched Arm Can reach forward >12 cm safely (5")   From Standing Position, Pick up Object from Floor Able to pick up shoe, needs supervision   From Standing Position, Turn to Look Behind Over each Shoulder Needs supervision when turning   Turn 360 Degrees Needs close supervision or verbal cueing   Standing Unsupported, Alternately Place Feet  on Step/Stool Able to complete >2 steps/needs minimal assist   Standing Unsupported, One Foot in Front Able to take small step independently and hold 30 seconds   Standing on One Leg Tries to lift leg/unable to hold 3 seconds but remains standing independently   Total Score 36           PT Education - 12/28/14 2252    Education provided Yes   Education Details PT evaluation findings, goals, POC.   Person(s) Educated Patient;Caregiver(s)   Methods Explanation   Comprehension Verbalized understanding          PT Short Term Goals - 12/28/14 2256    PT SHORT TERM GOAL #1   Title Pt will perform HEP with mod I using paper handout to facilitate carryover of home exercises. Target date: 01/25/15.   Time  4   Period Weeks   Status New   PT SHORT TERM GOAL #2   Title Pt will increase Berg Balance Scale score from 36/56 to 39/56 to progress toward significant improvement in functional standing balance.  Target date: 01/25/15   Time 4   Period Weeks   Status New   PT SHORT TERM GOAL #3   Title Pt will improve gait speed from 1.17 ft/sec to 1.47 ft/sec to demonstrate improved efficiency of ambulation.  Target date: 01/25/15.   Time 4   Period Days   Status New           PT Long Term Goals - 12/28/14 2300    PT LONG TERM GOAL #1   Title Pt will improve Berg score from 36/56 to 42/56 to demonstrate significant improvement in functional standing balance. Target: 02/22/15.   Time 8   Period Weeks   Status New   PT LONG TERM GOAL #2   Title Pt will improve gait speed from 1.17 ft/sec to 1.77 ft/sec to indicate decreased falls risk. Target: 02/22/15.   Time 8   Period Weeks   Status New   PT LONG TERM GOAL #3   Title Pt will ambulate >300' over unlevel surfaces with LRAD and mod I without overt LOB to indicate safety with community ambulation. Target: 02/22/15.   PT LONG TERM GOAL #4   Title Pt will traverse community surfaces (standard curb step, inclined/declined sidewalk) with mod I  using LRAD to indicate increased stability with community mobility. Target: 02/22/15.   Time 8   Period Days   Status New   PT LONG TERM GOAL #5   Title Pt will transfer into/out of personal car with mod I using LRAD. Target: 02/22/15.   Period Weeks   Status New          Plan - 12/28/14 2245    Clinical Impression Statement Pt is a 69 y/o F presenting to outpatient PT due to recent functional recline after fall sustained 12/06/14. Pt unsure as to whether she sustained LOC with fall, but pt does report intermittent dizziness/disequilibrium since fall.  Upon DC from previous about of outpatient PT (11/16/14), Berg Balance Scale score was 43/56 and self-selected gait speed was 3.06 ft/sec. On this PT evaluation, pt exhibited gait speed of 1.17 ft/sec and Berg Balance score of 36/56, both of which suggest increase fall risk and significant decline in stability and efficiency with functional mobility as compared with outcome measures described above. Pt will therefore benefit from skilled PT 2x/week for up to 8 weeks (total of 16 sessions) to address these impairments.   Pt will benefit from skilled therapeutic intervention in order to improve on the following deficits Abnormal gait;Decreased coordination;Decreased activity tolerance;Decreased balance;Decreased mobility;Decreased endurance;Decreased strength;Increased edema;Impaired sensation;Impaired flexibility;Impaired perceived functional ability;Impaired UE functional use;Impaired tone;Dizziness;Decreased range of motion;Postural dysfunction   Rehab Potential Good   PT Frequency 2x / week   PT Duration 8 weeks  Anticipate LTG's will be met before/at 6 weeks   PT Treatment/Interventions Gait training;Therapeutic exercise;Patient/family education;Balance training;Stair training;Neuromuscular re-education;Therapeutic activities;Functional mobility training;Manual techniques;Vestibular;ADLs/Self Care Home Management;Orthotic Fit/Training   PT Next Visit  Plan Initiate HEP. Postural control, dynamic standing balance, LLE strengthening, gait training.   PT Home Exercise Plan Standing balance, LE strengthening   Recommended Other Services None at this time.   Consulted and Agree with Plan of Care Patient;Family member/caregiver   Family Member Consulted daughter          G-Codes -  12/28/14 2253    Functional Assessment Tool Used Merrilee Jansky Balance Scale: 36/56   Functional Limitation Mobility: Walking and moving around   Mobility: Walking and Moving Around Current Status [phone]) At least 20 percent but less than 40 percent impaired, limited or restricted   Mobility: Walking and Moving Around Goal Status [phone]) At least 20 percent but less than 40 percent impaired, limited or restricted       Problem List Patient Active Problem List   Diagnosis Date Noted  . Syncope 12/06/2014  . Spastic hemiplegia affecting nondominant side 07/25/2014  . 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 Left hemiparesis 06/10/2014  . Left-sided neglect 06/10/2014  . 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 Morbid obesity- BMI 40 06/06/2014  . Acute respiratory failure with hypoxia 06/05/2014  . Cerebral infarction due to occlusion of right middle cerebral artery 06/03/2014  . Cerebral infarct 06/03/2014  . Altered mental status 06/03/2014  . GERD (gastroesophageal reflux disease) 04/12/2014  . Benign essential HTN 02/07/2014  . Dyslipidemia- statin intol 02/07/2014  . Chest pain- Low risk Myoview July 2015 02/06/2014   Billie Ruddy, PT, DPT [facility] [address] [place], [place], [address] Phone: [phone]   Fax:  [phone] 12/28/2014, 11:19 PM

## 2015-01-03 ENCOUNTER — Ambulatory Visit (INDEPENDENT_AMBULATORY_CARE_PROVIDER_SITE_OTHER): Payer: Medicare Other | Admitting: *Deleted

## 2015-01-03 DIAGNOSIS — I63411 Cerebral infarction due to embolism of right middle cerebral artery: Secondary | ICD-10-CM

## 2015-01-04 ENCOUNTER — Ambulatory Visit: Payer: Medicare Other | Admitting: Occupational Therapy

## 2015-01-04 ENCOUNTER — Ambulatory Visit: Payer: Medicare Other | Admitting: Physical Therapy

## 2015-01-04 ENCOUNTER — Encounter: Payer: Self-pay | Admitting: Occupational Therapy

## 2015-01-04 ENCOUNTER — Encounter: Payer: Self-pay | Admitting: Internal Medicine

## 2015-01-04 DIAGNOSIS — Z7409 Other reduced mobility: Secondary | ICD-10-CM

## 2015-01-04 DIAGNOSIS — R269 Unspecified abnormalities of gait and mobility: Secondary | ICD-10-CM

## 2015-01-04 DIAGNOSIS — R293 Abnormal posture: Secondary | ICD-10-CM

## 2015-01-04 DIAGNOSIS — H539 Unspecified visual disturbance: Secondary | ICD-10-CM | POA: Diagnosis not present

## 2015-01-04 DIAGNOSIS — G8114 Spastic hemiplegia affecting left nondominant side: Secondary | ICD-10-CM

## 2015-01-04 DIAGNOSIS — R4189 Other symptoms and signs involving cognitive functions and awareness: Secondary | ICD-10-CM | POA: Diagnosis not present

## 2015-01-04 DIAGNOSIS — M25512 Pain in left shoulder: Secondary | ICD-10-CM | POA: Diagnosis not present

## 2015-01-04 NOTE — Patient Instructions (Addendum)
Abduction: Clam (Eccentric) - Side-Lying   Lie on the RIGHT side with knees bent. Lift top LEFT knee, keeping feet together. Keep trunk steady. Slowly lower for 3-4 seconds. Perform 20 repetitions, 3-5  Times per day.  Functional Quadriceps: Sit to Stand   Sit with your full-length mirror in front of you. Make sure your feet and hips are even (symmetrical). Stand up without using your hands to push off from the chair. While transitioning from seated to standing, look at yourself in the mirror, making sure you're using the right and left side equally.  Do this 10 times. Perform this at least 3 times per day, every day.

## 2015-01-04 NOTE — Progress Notes (Signed)
Loop recorder 

## 2015-01-04 NOTE — Therapy (Signed)
[facility] [address] [place], [place], [address] Phone: [phone]   Fax:  [phone]  Occupational Therapy Treatment  Patient Details  Name: Mary Le MRN: [id number] Date of Birth: 1946-02-28 Referring Provider:  [name], MD  Encounter Date: 01/04/2015      OT End of Session - 01/04/15 1744    Visit Number 2   Number of Visits 17   Date for OT Re-Evaluation 02/25/15   Authorization Type medicare - needs G code!   Authorization Time Period 60 days ( anticipate d/c after 6 weeks depending on progress)   Authorization - Visit Number 2   Authorization - Number of Visits 10   OT Start Time 2119   OT Stop Time 1530   OT Time Calculation (min) 45 min   Activity Tolerance Patient tolerated treatment well   Behavior During Therapy WFL for tasks assessed/performed      Past Medical History  Diagnosis Date  . Hypertension   . Hypercholesteremia   . Back pain     arthritis  . CVA (cerebral infarction)   . Fall 11/2014    fell in bathroom, uses a cane  . Joint pain     back, history of   . Weakness     Past Surgical History  Procedure Laterality Date  . Appendectomy    . Tonsillectomy    . Hemorroidectomy    . Tubal ligation    . Radiology with anesthesia N/A 06/03/2014    Procedure: RADIOLOGY WITH ANESTHESIA;  Surgeon: [name], MD;  Location: [facility];  Service: Radiology;  Laterality: N/A;  . Tee without cardioversion N/A 06/07/2014    Procedure: TRANSESOPHAGEAL ECHOCARDIOGRAM (TEE);  Surgeon: [name], MD;  Location: [facility] ENDOSCOPY;  Service: Cardiovascular;  Laterality: N/A;  . Loop recorder implant  06/07/2014    MDT LINQ implanted by Dr [name] for cryptogenic stroke  . Loop recorder implant N/A 06/07/2014    Procedure: LOOP RECORDER IMPLANT;  Surgeon: [name], MD;  Location: [facility] CATH LAB;  Service: Cardiovascular;  Laterality: N/A;    There were no vitals filed for  this visit.  Visit Diagnosis:  Spastic hemiplegia affecting left nondominant side      Subjective Assessment - 01/04/15 1737    Subjective  I still wear the hand splint.  I am not sure if the elbow brace is still effective.   Pertinent History see epic snapshot;  HTN, fluctuating BP since stroke   Patient Stated Goals to get my hand working, button clothes   Currently in Pain? No/denies   Pain Score 0-No pain                      OT Treatments/Exercises (OP) - 01/04/15 0001    Bed Mobility   Bed Mobility Rolling Right;Rolling Left   Rolling Right Details (indicate cue type and reason) Used gemtle rolling toward and away from stationary left arm to increase range of motion in left shoulder, and stretch tight pectoralis and bicep muscle   Neurological Re-education Exercises   Other Exercises 1 Completed preparatory stretching and passive range of motion to left shoulder and elbow.  Patient with minimal report of discomfort, especially when stretches performed in slow and controlled manner.  Worked to first increase muscle length, especially biceps and triceps, then worked to recruit muscle activation of triceps.  Patient had difficulty actively flexing elbow without resultant internal rotation of humeral head, or  forearm pronation.   Other Exercises 2 Patient showing improved ability to activate external rotators, and elbow extension.  Patient has difficulty with two joint muscles, and with combinign motion as needed for reach, e.g. shoulder flexion with elbow extension.  Patient very attentive and able to follow verbal and or  physical cueing.                  OT Education - 01/04/15 1744    Education provided Yes   Education Details use of rolling to help reduce muscle tone in left upper extremity   Person(s) Educated Patient   Methods Explanation;Demonstration   Comprehension Tactile cues required          OT Short Term Goals - 01/04/15 1747    OT SHORT  TERM GOAL #1   Title I with updated HEP   Baseline due 12/27/14   Time 4   Period Weeks   Status On-going   OT SHORT TERM GOAL #2   Title Pt will donn shirt with setup consistently   Time 4   Period Weeks   Status On-going   OT SHORT TERM GOAL #3   Title Pt will incorporate LUE into ADLS/ IADLS at least 89% of the time as a stabilizer/ gross A with pain less than or equal to 3/10   Time 4   Period Weeks   Status On-going   OT SHORT TERM GOAL #4   Title Pt will assist with light home management / meal prep at least 3x week.   Time 4   Period Weeks   Status On-going           OT Long Term Goals - 01/04/15 1748    OT LONG TERM GOAL #1   Title Pt will demonstrate ability to stand modified indpendently for light home management/ cooking x 20 mins without LOB or rest break   Baseline due 01/25/12   Time 8   Period Weeks   Status On-going   OT LONG TERM GOAL #2   Title Pt will increase A/ROM shoulder flexion to 60* consistently for increased ease with bathing and dressing.   Time 8   Period Weeks   Status On-going   OT LONG TERM GOAL #3   Title Pt will demonstrate A/ROM elbow extension to -25 for increased ease with ADLs.   Time 8   Period Weeks   Status On-going   OT LONG TERM GOAL #4   Title Pt will donn pants with set up only on a consistent basis.   Time 8   Period Weeks   Status On-going               Plan - 01/04/15 1745    Clinical Impression Statement Patient with strong desire to improve movement and functional use of left upper extremity following recent Botox injection.     Pt will benefit from skilled therapeutic intervention in order to improve on the following deficits (Retired) Decreased activity tolerance;Decreased balance;Decreased cognition;Decreased coordination;Decreased range of motion;Decreased safety awareness;Decreased mobility;Decreased knowledge of use of DME;Decreased strength;Increased edema;Impaired UE functional use;Impaired  tone;Pain;Impaired sensation;Impaired perceived functional ability;Improper body mechanics;Obesity   Rehab Potential Good   Clinical Impairments Affecting Rehab Potential pain, spasticity, decreased balance, decrease activity tolerance   OT Frequency 2x / week   OT Duration 8 weeks   OT Treatment/Interventions Self-care/ADL training;Ultrasound;Moist Heat;Fluidtherapy;Therapeutic exercise;Neuromuscular education;Energy conservation;Manual Therapy;Functional Mobility Training;DME and/or AE instruction;Passive range of motion;Visual/perceptual remediation/compensation;Cognitive remediation/compensation;Therapeutic activities;Splinting;Patient/family education;Balance training   Plan assess  effectivenesss of elbow brace after botox.  stretching program, weight bearing and neuro re-ed left UE   Consulted and Agree with Plan of Care Patient        Problem List Patient Active Problem List   Diagnosis Date Noted  . Syncope 12/06/2014  . Spastic hemiplegia affecting nondominant side 07/25/2014  . 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 Left hemiparesis 06/10/2014  . Left-sided neglect 06/10/2014  . 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 Morbid obesity- BMI 40 06/06/2014  . Acute respiratory failure with hypoxia 06/05/2014  . Cerebral infarction due to occlusion of right middle cerebral artery 06/03/2014  . Cerebral infarct 06/03/2014  . Altered mental status 06/03/2014  . GERD (gastroesophageal reflux disease) 04/12/2014  . Benign essential HTN 02/07/2014  . Dyslipidemia- statin intol 02/07/2014  . Chest pain- Low risk Myoview July 2015 02/06/2014    Mariah Milling, OTR/L 01/04/2015, 5:49 PM  [facility] [address] [place], [place], [address] Phone: [phone]   Fax:  [phone]

## 2015-01-04 NOTE — Therapy (Signed)
[facility] [address] [place], [place], [address] Phone: [phone]   Fax:  [phone]  Physical Therapy Treatment  Patient Details  Name: Mary Le MRN: [id number] Date of Birth: 10/02/1945 Referring Provider:  [name], MD  Encounter Date: 01/04/2015      PT End of Session - 01/04/15 1451    Visit Number 2   Number of Visits 17   Date for PT Re-Evaluation 02/26/15   Authorization Type Medicare - G Codes required every 10 visits   PT Start Time 1400   PT Stop Time 1445   PT Time Calculation (min) 45 min   Equipment Utilized During Treatment Gait belt   Activity Tolerance Patient tolerated treatment well   Behavior During Therapy [facility] for tasks assessed/performed      Past Medical History  Diagnosis Date  . Hypertension   . Hypercholesteremia   . Back pain     arthritis  . CVA (cerebral infarction)   . Fall 11/2014    fell in bathroom, uses a cane  . Joint pain     back, history of   . Weakness     Past Surgical History  Procedure Laterality Date  . Appendectomy    . Tonsillectomy    . Hemorroidectomy    . Tubal ligation    . Radiology with anesthesia N/A 06/03/2014    Procedure: RADIOLOGY WITH ANESTHESIA;  Surgeon: [name], MD;  Location: [place];  Service: Radiology;  Laterality: N/A;  . Tee without cardioversion N/A 06/07/2014    Procedure: TRANSESOPHAGEAL ECHOCARDIOGRAM (TEE);  Surgeon: [name], MD;  Location: [facility] ENDOSCOPY;  Service: Cardiovascular;  Laterality: N/A;  . Loop recorder implant  06/07/2014    MDT LINQ implanted by Dr [name] for cryptogenic stroke  . Loop recorder implant N/A 06/07/2014    Procedure: LOOP RECORDER IMPLANT;  Surgeon: [name], MD;  Location: [place] CATH LAB;  Service: Cardiovascular;  Laterality: N/A;    There were no vitals filed for this visit.  Visit Diagnosis:  Spastic hemiplegia affecting left nondominant side  Impaired  functional mobility and activity tolerance  Abnormality of gait  Posture abnormality      Subjective Assessment - 01/04/15 1407    Subjective Pt reports no falls. Pt denies dizziness but reports "kind of uncomfortable when I have to take a step backwards".   Patient Stated Goals Improve overall mobility, increase independence.   Currently in Pain? No/denies          Treatment   Gait Training: - Pt performed gait 2 x115' over level surfaces without AD requiring min guard to min A, pt demonstrated decreased lateral weight shift to L side, L hip retraction, L Trendelenberg, limited trunk rotation, decreased L arm swing, L midfoot strike, and decreased control of eccentric control of L ankle dorsiflexion during weight acceptance. Multimodal cueing during second gait trial focused on lateral weight shift to L side, L hip protraction/increased L single limb stance stability.   Therapeutic Exercises:  - L clamshells 2 x20 reps with cueing for alignment; added to HEP.  Neuro Re-ed: - Pt performed blocked practice of sit <> stand from EOM without UE's requiring min guard, use of mirror for visual feedback/postural awareness; verbal cueing for setup, symmetrical alignment; tactile cueing at L knee for increased weightbearing/proprioceptive input; multimodal cueing for neutral pelvic tilt, erect trunk flexion, full anterior weight shift, and LLE motor control (focus on stand > sit). Added to  HEP.       PT Education - 01/04/15 1458    Education provided Yes   Education Details Effect of compensatory movement strategies of safety/efficiency of transfers, gait. Initiated HEP.   Person(s) Educated Patient   Methods Explanation;Demonstration;Handout;Tactile cues   Comprehension Verbalized understanding;Returned demonstration          PT Short Term Goals - 01/04/15 1500    PT SHORT TERM GOAL #1   Title Pt will perform HEP with mod I using paper handout to facilitate carryover of home  exercises. Target date: 01/25/15.   Time 4   Period Weeks   Status On-going   PT SHORT TERM GOAL #2   Title Pt will increase Berg Balance Scale score from 36/56 to 39/56 to progress toward significant improvement in functional standing balance.  Target date: 01/25/15   Time 4   Period Weeks   Status On-going   PT SHORT TERM GOAL #3   Title Pt will improve gait speed from 1.17 ft/sec to 1.47 ft/sec to demonstrate improved efficiency of ambulation.  Target date: 01/25/15.   Time 4   Period Days   Status On-going   PT SHORT TERM GOAL #4   Title _____________________________________________________________   Baseline _______________________________________________   PT SHORT TERM GOAL #5   Title ______________________________________________________________________   Baseline ____________________________________________           PT Long Term Goals - 01/04/15 1501    PT LONG TERM GOAL #1   Title Pt will improve Berg score from 36/56 to 42/56 to demonstrate significant improvement in functional standing balance. Target: 02/22/15.   Time 8   Period Weeks   Status On-going   PT LONG TERM GOAL #2   Title Pt will improve gait speed from 1.17 ft/sec to 1.77 ft/sec to indicate decreased falls risk. Target: 02/22/15.   Time 8   Period Weeks   Status On-going   PT LONG TERM GOAL #3   Title Pt will ambulate >300' over unlevel surfaces with LRAD and mod I without overt LOB to indicate safety with community ambulation. Target: 02/22/15.   Time 8   Period Weeks   Status On-going   PT LONG TERM GOAL #4   Title Pt will traverse community surfaces (standard curb step, inclined/declined sidewalk) with mod I using LRAD to indicate increased stability with community mobility. Target: 02/22/15.   Time 8   Period Weeks   Status On-going   PT LONG TERM GOAL #5   Title Pt will transfer into/out of personal car with mod I using LRAD. Target: 02/22/15.   Period Weeks   Status On-going   PT LONG TERM GOAL #6    Title ______________________________________________               Plan - 01/04/15 1453    Clinical Impression Statement Session focused on increased symmetry during transitional movements to promote LE weightbearing/activation and address abnormal postural alignment and compensatory gait pattern. Pt responded well to use of mirror for visual feedback and demonstrated effective within-session carryover of cueing. Pt will continue to benefit from skilled PT to address said impairments.   Rehab Potential Good   PT Frequency 2x / week   PT Duration 8 weeks   PT Treatment/Interventions Gait training;Therapeutic exercise;Patient/family education;Balance training;Stair training;Neuromuscular re-education;Therapeutic activities;Functional mobility training;Manual techniques;Vestibular;ADLs/Self Care Home Management;Orthotic Fit/Training   PT Next Visit Plan Initiate HEP. Postural control, dynamic standing balance, LLE strengthening, gait training.   PT Home Exercise Plan LE strengthening, symmetrical weightbearing,  standing balance.   Consulted and Agree with Plan of Care Patient        Problem List Patient Active Problem List   Diagnosis Date Noted  . Syncope 12/06/2014  . Spastic hemiplegia affecting nondominant side 07/25/2014  . 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 Left hemiparesis 06/10/2014  . Left-sided neglect 06/10/2014  . 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 Morbid obesity- BMI 40 06/06/2014  . Acute respiratory failure with hypoxia 06/05/2014  . Cerebral infarction due to occlusion of right middle cerebral artery 06/03/2014  . Cerebral infarct 06/03/2014  . Altered mental status 06/03/2014  . GERD (gastroesophageal reflux disease) 04/12/2014  . Benign essential HTN 02/07/2014  . Dyslipidemia- statin intol 02/07/2014  . Chest pain- Low risk Myoview July 2015 02/06/2014   Billie Ruddy, PT,  DPT [facility] [address] [place], [place], [address] Phone: [phone]   Fax:  [phone] 01/04/2015, 3:10 PM

## 2015-01-05 ENCOUNTER — Ambulatory Visit: Payer: Medicare Other | Admitting: Physical Therapy

## 2015-01-05 ENCOUNTER — Encounter: Payer: Self-pay | Admitting: Physical Therapy

## 2015-01-05 DIAGNOSIS — G8114 Spastic hemiplegia affecting left nondominant side: Secondary | ICD-10-CM

## 2015-01-05 DIAGNOSIS — R269 Unspecified abnormalities of gait and mobility: Secondary | ICD-10-CM | POA: Diagnosis not present

## 2015-01-05 DIAGNOSIS — H539 Unspecified visual disturbance: Secondary | ICD-10-CM | POA: Diagnosis not present

## 2015-01-05 DIAGNOSIS — R4189 Other symptoms and signs involving cognitive functions and awareness: Secondary | ICD-10-CM | POA: Diagnosis not present

## 2015-01-05 DIAGNOSIS — Z7409 Other reduced mobility: Secondary | ICD-10-CM

## 2015-01-05 DIAGNOSIS — M25512 Pain in left shoulder: Secondary | ICD-10-CM | POA: Diagnosis not present

## 2015-01-05 NOTE — Therapy (Signed)
[facility] [address] [place], [place], [address] Phone: [phone]   Fax:  [phone]  Physical Therapy Treatment  Patient Details  Name: Mary Le MRN: [id number] Date of Birth: 02-Sep-1945 Referring Provider:  [name], MD  Encounter Date: 01/05/2015      PT End of Session - 01/05/15 1407    Visit Number 3   Number of Visits 17   Date for PT Re-Evaluation 02/26/15   Authorization Type Medicare - G Codes required every 10 visits   PT Start Time 1400   PT Stop Time 1445   PT Time Calculation (min) 45 min   Equipment Utilized During Treatment Gait belt   Activity Tolerance Patient tolerated treatment well   Behavior During Therapy [facility] for tasks assessed/performed      Past Medical History  Diagnosis Date  . Hypertension   . Hypercholesteremia   . Back pain     arthritis  . CVA (cerebral infarction)   . Fall 11/2014    fell in bathroom, uses a cane  . Joint pain     back, history of   . Weakness     Past Surgical History  Procedure Laterality Date  . Appendectomy    . Tonsillectomy    . Hemorroidectomy    . Tubal ligation    . Radiology with anesthesia N/A 06/03/2014    Procedure: RADIOLOGY WITH ANESTHESIA;  Surgeon: [name], MD;  Location: [place];  Service: Radiology;  Laterality: N/A;  . Tee without cardioversion N/A 06/07/2014    Procedure: TRANSESOPHAGEAL ECHOCARDIOGRAM (TEE);  Surgeon: [name], MD;  [facility] ENDOSCOPY;  Service: Cardiovascular;  Laterality: N/A;  . Loop recorder implant  06/07/2014    MDT LINQ implanted by Dr [name] for cryptogenic stroke  . Loop recorder implant N/A 06/07/2014    Procedure: LOOP RECORDER IMPLANT;  Surgeon: [name], MD;  Location: [place] CATH LAB;  Service: Cardiovascular;  Laterality: N/A;    There were no vitals filed for this visit.  Visit Diagnosis:  Impaired functional mobility and activity tolerance  Spastic  hemiplegia affecting left nondominant side      Subjective Assessment - 01/05/15 1406    Subjective No new complaints. No falls to report. No pain currently, did have some left UE pain this am however pain medicine took it away.   Currently in Pain? No/denies   Pain Score 0-No pain     Treatment: Reviewed pt's current HEP from last episode of care and one's given this episode to date. Advanced/added the following exercises: Bridge with ball squeeze, hook lying- left hip off/onto mat with knee flexion, green thera band resisted DF/PF, left single leg stance with left UE support to none, left singe leg stance heel raises with bil UE support, bil feet inversion/eversion rolling with bil UE support.         PT Education - 01/05/15 1443    Education provided Yes   Education Details HEP: hip and ankle strengthening/stability exercises   Person(s) Educated Patient;Child(ren)  daughter   Methods Explanation;Demonstration;Handout   Comprehension Verbalized understanding;Verbal cues required;Need further instruction          PT Short Term Goals - 01/04/15 1500    PT SHORT TERM GOAL #1   Title Pt will perform HEP with mod I using paper handout to facilitate carryover of home exercises. Target date: 01/25/15.   Time 4   Period Weeks   Status On-going  PT SHORT TERM GOAL #2   Title Pt will increase Berg Balance Scale score from 36/56 to 39/56 to progress toward significant improvement in functional standing balance.  Target date: 01/25/15   Time 4   Period Weeks   Status On-going   PT SHORT TERM GOAL #3   Title Pt will improve gait speed from 1.17 ft/sec to 1.47 ft/sec to demonstrate improved efficiency of ambulation.  Target date: 01/25/15.   Time 4   Period Days   Status On-going   PT SHORT TERM GOAL #4   Title _____________________________________________________________   Baseline _______________________________________________   PT SHORT TERM GOAL #5   Title  ______________________________________________________________________   Baseline ____________________________________________           PT Long Term Goals - 01/04/15 1501    PT LONG TERM GOAL #1   Title Pt will improve Berg score from 36/56 to 42/56 to demonstrate significant improvement in functional standing balance. Target: 02/22/15.   Time 8   Period Weeks   Status On-going   PT LONG TERM GOAL #2   Title Pt will improve gait speed from 1.17 ft/sec to 1.77 ft/sec to indicate decreased falls risk. Target: 02/22/15.   Time 8   Period Weeks   Status On-going   PT LONG TERM GOAL #3   Title Pt will ambulate >300' over unlevel surfaces with LRAD and mod I without overt LOB to indicate safety with community ambulation. Target: 02/22/15.   Time 8   Period Weeks   Status On-going   PT LONG TERM GOAL #4   Title Pt will traverse community surfaces (standard curb step, inclined/declined sidewalk) with mod I using LRAD to indicate increased stability with community mobility. Target: 02/22/15.   Time 8   Period Weeks   Status On-going   PT LONG TERM GOAL #5   Title Pt will transfer into/out of personal car with mod I using LRAD. Target: 02/22/15.   Period Weeks   Status On-going   PT LONG TERM GOAL #6   Title ______________________________________________           Plan - 01/05/15 1407    Clinical Impression Statement Advanced pt's current HEP from previous episode for lower extremity strengthening without any issues reported in session. Daughter present and educated as well. Pt progressing toward goals.    Pt will benefit from skilled therapeutic intervention in order to improve on the following deficits Abnormal gait;Decreased coordination;Decreased activity tolerance;Decreased balance;Decreased mobility;Decreased endurance;Decreased strength;Increased edema;Impaired sensation;Impaired flexibility;Impaired perceived functional ability;Impaired UE functional use;Impaired  tone;Dizziness;Decreased range of motion;Postural dysfunction   Rehab Potential Good   PT Frequency 2x / week   PT Duration 8 weeks  Anticipate LTG's will be met before/at 6 weeks   PT Treatment/Interventions Gait training;Therapeutic exercise;Patient/family education;Balance training;Stair training;Neuromuscular re-education;Therapeutic activities;Functional mobility training;Manual techniques;Vestibular;ADLs/Self Care Home Management;Orthotic Fit/Training   PT Next Visit Plan Postural control, dynamic standing balance, LLE strengthening, gait training.   PT Home Exercise Plan Standing balance, LE strengthening   Consulted and Agree with Plan of Care Patient;Family member/caregiver   Family Member Consulted daughter        Problem List Patient Active Problem List   Diagnosis Date Noted  . Syncope 12/06/2014  . Spastic hemiplegia affecting nondominant side 07/25/2014  . 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 Left hemiparesis 06/10/2014  . Left-sided neglect 06/10/2014  . 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 Morbid obesity- BMI 40 06/06/2014  . Acute respiratory  failure with hypoxia 06/05/2014  . Cerebral infarction due to occlusion of right middle cerebral artery 06/03/2014  . Cerebral infarct 06/03/2014  . Altered mental status 06/03/2014  . GERD (gastroesophageal reflux disease) 04/12/2014  . Benign essential HTN 02/07/2014  . Dyslipidemia- statin intol 02/07/2014  . Chest pain- Low risk Myoview July 2015 02/06/2014    Willow Ora 01/06/2015, 11:08 AM  Willow Ora, PTA, [facility] [address], [place], [place] [address] [phone] 01/06/2015, 11:08 AM

## 2015-01-05 NOTE — Patient Instructions (Signed)
Therapeutic - Bridging   Lift buttocks, keeping back straight and arms on floor. Squeeze ball between knees with each lift.  Hold __5__ seconds. Repeat __10 times. 2 sets. Copyright  VHI. All rights reserved.  Hip Flexor Stretch   Lying on back with left side near edge of bed, bend both legs, feet flat. Left left leg off, then back onto the bed. 2 sets of 10 reps. 1-2 times a day.  http://[email or web address].exer.us/347   Copyright  VHI. All rights reserved.  Dorsiflexion: Resisted   With green band around left foot, pull toward face. Hold 5 seconds. Repeat _10___ times per set. Do _1_ sets per session. Do _1-2___ sessions per day.  http://orth.exer.us/8   Copyright  VHI. All rights reserved.  Plantar Flexion: Resisted   With green band around left foot, press down. Hold for 5 seconds. Repeat __10__ times per set. Do __1__ sets per session. Do _1-2___ sessions per day.  http://orth.exer.us/10   Copyright  VHI. All rights reserved.  Balance: Unilateral   Attempt to balance on left leg, eyes open. Hold _5-10___ seconds. With no UE or left UE support only. Repeat __10__ times per set. Do _1___ sets per session. Do __1-2 sessions per day.  http://orth.exer.us/28   Copyright  VHI. All rights reserved.  Balance: Unilateral   Heel Raise: Unilateral (Standing)   Balance on left foot, then rise on ball of foot. Hold with both hands, light support. Repeat _10___ times per set. Do __1__ sets per session. Do ___1-2_ sessions per day.  http://orth.exer.us/40   Copyright  VHI. All rights reserved.   Additional ankle exercise Standing with both UE's support: feet hip width apart 1. Push inner foot down, rolling ankles toward each other 2. Then push outer foot down, rolling ankles away from each other.  Small movements only! 10 times, 1 set, 1-2 times a day

## 2015-01-06 ENCOUNTER — Ambulatory Visit: Payer: Medicare Other | Admitting: Physical Medicine & Rehabilitation

## 2015-01-11 ENCOUNTER — Ambulatory Visit: Payer: Medicare Other | Admitting: Physical Therapy

## 2015-01-11 ENCOUNTER — Ambulatory Visit: Payer: Medicare Other | Admitting: *Deleted

## 2015-01-11 DIAGNOSIS — R269 Unspecified abnormalities of gait and mobility: Secondary | ICD-10-CM | POA: Diagnosis not present

## 2015-01-11 DIAGNOSIS — R4189 Other symptoms and signs involving cognitive functions and awareness: Secondary | ICD-10-CM | POA: Diagnosis not present

## 2015-01-11 DIAGNOSIS — R293 Abnormal posture: Secondary | ICD-10-CM

## 2015-01-11 DIAGNOSIS — IMO0002 Reserved for concepts with insufficient information to code with codable children: Secondary | ICD-10-CM

## 2015-01-11 DIAGNOSIS — G8114 Spastic hemiplegia affecting left nondominant side: Secondary | ICD-10-CM

## 2015-01-11 DIAGNOSIS — Z7409 Other reduced mobility: Secondary | ICD-10-CM | POA: Diagnosis not present

## 2015-01-11 DIAGNOSIS — M25512 Pain in left shoulder: Secondary | ICD-10-CM

## 2015-01-11 DIAGNOSIS — H539 Unspecified visual disturbance: Secondary | ICD-10-CM | POA: Diagnosis not present

## 2015-01-11 LAB — CUP PACEART REMOTE DEVICE CHECK: Date Time Interrogation Session: 20160622154947

## 2015-01-11 NOTE — Therapy (Signed)
[facility] [address] [place], [place], [address] Phone: [phone]   Fax:  [phone]  Occupational Therapy Treatment  Patient Details  Name: Mary Le MRN: [id number] Date of Birth: [birth year] Referring Provider:  [name], MD  Encounter Date: 01/11/2015      OT End of Session - 01/11/15 1543    Visit Number 3   Number of Visits 17   Date for OT Re-Evaluation 02/25/15   Authorization Type medicare - needs G code!   Authorization Time Period 60 days ( anticipate d/c after 6 weeks depending on progress)   Authorization - Visit Number 3   Authorization - Number of Visits 10   OT Start Time 1450   OT Stop Time 1530   OT Time Calculation (min) 40 min   Activity Tolerance Patient tolerated treatment well   Behavior During Therapy W[facility] for tasks assessed/performed      Past Medical History  Diagnosis Date  . Hypertension   . Hypercholesteremia   . Back pain     arthritis  . CVA (cerebral infarction)   . Fall 11/2014    fell in bathroom, uses a cane  . Joint pain     back, history of   . Weakness     Past Surgical History  Procedure Laterality Date  . Appendectomy    . Tonsillectomy    . Hemorroidectomy    . Tubal ligation    . Radiology with anesthesia N/A 06/03/2014    Procedure: RADIOLOGY WITH ANESTHESIA;  Surgeon: [name], MD;  Location: [place];  Service: Radiology;  Laterality: N/A;  . Tee without cardioversion N/A 06/07/2014    Procedure: TRANSESOPHAGEAL ECHOCARDIOGRAM (TEE);  Surgeon: [name], MD;  Location: [facility] ENDOSCOPY;  Service: Cardiovascular;  Laterality: N/A;  . Loop recorder implant  06/07/2014    MDT LINQ implanted by Dr [name] for cryptogenic stroke  . Loop recorder implant N/A 06/07/2014    Procedure: LOOP RECORDER IMPLANT;  Surgeon: [name], MD;  Location: [place] CATH LAB;  Service: Cardiovascular;  Laterality: N/A;    There were no vitals filed for  this visit.  Visit Diagnosis:  Impaired functional mobility and activity tolerance  Spastic hemiplegia affecting left nondominant side  Posture abnormality  Pain in joint, shoulder region, left      Subjective Assessment - 01/11/15 1455    Subjective  I've been doing pretty good I think, I've been trying to move more. My sleep patterns have been a little off. Been walking more.    Patient is accompained by: Family member  daughter   Pertinent History see epic snapshot;  HTN, fluctuating BP since stroke   Patient Stated Goals get in and out of the tub, I want to take a bath   Currently in Pain? No/denies        Focused skilled intervention on bed mobility and NMR to LUE in supine, side lying, and seated edge of mat (shoulder flexion/extension -supine/side lying, elbow flexion/extension -supine/side lying, shoulder shrugs -edge of mat, shoulder protraction/retraction -edge of mat). Pt with increased tightness/stiffness throughout LUE, some pain with NMR and PROM. Gave patient handout on vinegar soaks due to increased dry skin > left hand for personal hygiene, see under Patient instructions for more information. Daughter present during session and supportive.                         OT Short Term  Goals - 01/11/15 1542    OT SHORT TERM GOAL #1   Title I with updated HEP   Time 4   Period Weeks   Status On-going   OT SHORT TERM GOAL #2   Title Pt will donn shirt with setup consistently   Time 4   Period Weeks   Status On-going   OT SHORT TERM GOAL #3   Title Pt will incorporate LUE into ADLS/ IADLS at least 44% of the time as a stabilizer/ gross A with pain less than or equal to 3/10   Time 4   Period Weeks   Status On-going   OT SHORT TERM GOAL #4   Title Pt will assist with light home management / meal prep at least 3x week.   Time 4   Period Weeks   Status On-going           OT Long Term Goals - 01/11/15 1543    OT LONG TERM GOAL #1   Title Pt  will demonstrate ability to stand modified indpendently for light home management/ cooking x 20 mins without LOB or rest break   Time 8   Period Weeks   Status On-going   OT LONG TERM GOAL #2   Title Pt will increase A/ROM shoulder flexion to 60* consistently for increased ease with bathing and dressing.   Time 8   Period Weeks   Status On-going   OT LONG TERM GOAL #3   Title Pt will demonstrate A/ROM elbow extension to -25 for increased ease with ADLs.   Time 8   Period Weeks   Status On-going   OT LONG TERM GOAL #4   Title Pt will donn pants with set up only on a consistent basis.   Time 8   Period Weeks   Status On-going               Plan - 01/11/15 1539    Clinical Impression Statement Patient continues to be motivated post Botox injection, continue plan of care for now.    Pt will benefit from skilled therapeutic intervention in order to improve on the following deficits (Retired) Decreased activity tolerance;Decreased balance;Decreased cognition;Decreased coordination;Decreased range of motion;Decreased safety awareness;Decreased mobility;Decreased knowledge of use of DME;Decreased strength;Increased edema;Impaired UE functional use;Impaired tone;Pain;Impaired sensation;Impaired perceived functional ability;Improper body mechanics;Obesity   Rehab Potential Good   Clinical Impairments Affecting Rehab Potential pain, spasticity, decreased balance, decrease activity tolerance   OT Frequency 2x / week   OT Duration 8 weeks   OT Treatment/Interventions Self-care/ADL training;Ultrasound;Moist Heat;Fluidtherapy;Therapeutic exercise;Neuromuscular education;Energy conservation;Manual Therapy;Functional Mobility Training;DME and/or AE instruction;Passive range of motion;Visual/perceptual remediation/compensation;Cognitive remediation/compensation;Therapeutic activities;Splinting;Patient/family education;Balance training   Plan Continue to assess effectivenss of elbow brace after  botox, stretching to LUE, weight bearing and neuro re-edu to LUE. Pt also interested in getting in/out of tub, talked with PT regarding floor transfers.    Consulted and Agree with Plan of Care Patient;Family member/caregiver   Family Member Consulted daughter        Problem List Patient Active Problem List   Diagnosis Date Noted  . Syncope 12/06/2014  . Spastic hemiplegia affecting nondominant side 07/25/2014  . 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 Left hemiparesis 06/10/2014  . Left-sided neglect 06/10/2014  . 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 Morbid obesity- BMI 40 06/06/2014  . Acute respiratory failure with hypoxia 06/05/2014  . Cerebral infarction due to occlusion of right middle cerebral artery 06/03/2014  .  Cerebral infarct 06/03/2014  . Altered mental status 06/03/2014  . GERD (gastroesophageal reflux disease) 04/12/2014  . Benign essential HTN 02/07/2014  . Dyslipidemia- statin intol 02/07/2014  . Chest pain- Low risk Myoview July 2015 02/06/2014    Karra Pink , MS, OTR/L, CLT  01/11/2015, 3:45 PM  [facility] [address] [place], [place], [address] Phone: [phone]   Fax:  [phone]

## 2015-01-11 NOTE — Therapy (Signed)
[facility] [address] [place], [place], [address] Phone: [phone]   Fax:  [phone]  Physical Therapy Treatment  Patient Details  Name: Mary Le MRN: [id number] Date of Birth: 04-29-1946 Referring Provider:  [name], MD  Encounter Date: 01/11/2015      PT End of Session - 01/11/15 1452    Visit Number 4   Number of Visits 17   Date for PT Re-Evaluation 02/26/15   Authorization Type Medicare - G Codes required every 10 visits   PT Start Time 1400   PT Stop Time 1445   PT Time Calculation (min) 45 min   Equipment Utilized During Treatment Gait belt   Activity Tolerance Patient tolerated treatment well   Behavior During Therapy [facility] for tasks assessed/performed      Past Medical History  Diagnosis Date  . Hypertension   . Hypercholesteremia   . Back pain     arthritis  . CVA (cerebral infarction)   . Fall 11/2014    fell in bathroom, uses a cane  . Joint pain     back, history of   . Weakness     Past Surgical History  Procedure Laterality Date  . Appendectomy    . Tonsillectomy    . Hemorroidectomy    . Tubal ligation    . Radiology with anesthesia N/A 06/03/2014    Procedure: RADIOLOGY WITH ANESTHESIA;  Surgeon: [name], MD;  Location: [place];  Service: Radiology;  Laterality: N/A;  . Tee without cardioversion N/A 06/07/2014    Procedure: TRANSESOPHAGEAL ECHOCARDIOGRAM (TEE);  Surgeon: [name], MD;  [facility] ENDOSCOPY;  Service: Cardiovascular;  Laterality: N/A;  . Loop recorder implant  06/07/2014    MDT LINQ implanted by Dr [name] for cryptogenic stroke  . Loop recorder implant N/A 06/07/2014    Procedure: LOOP RECORDER IMPLANT;  Surgeon: [name], MD;  Location: [facility] CATH LAB;  Service: Cardiovascular;  Laterality: N/A;    There were no vitals filed for this visit.  Visit Diagnosis:  Impaired functional mobility and activity tolerance  Abnormality  of gait  Posture abnormality  Weakness due to cerebrovascular accident      Subjective Assessment - 01/11/15 1408    Subjective No falls; minimal soreness in L hip/groin. Pt reports increased ease of sit > stand at home.  Pt report needing assist of husband to lift L leg into personal car (SUV).   Patient Stated Goals Improve overall mobility, increase independence.   Currently in Pain? Yes   Pain Score 4    Pain Location Hip   Pain Orientation Left;Anterior;Proximal   Pain Descriptors / Indicators Sore   Pain Type Acute pain   Pain Onset Yesterday   Aggravating Factors  Pt unsure   Pain Relieving Factors Pt unsure         Treatment   Therapeutic Exercises: - Supine, hook lying: pt performed prolonged passive stretch of L iliopsoas, L rectus femoris x2 minutes per muscle group (4 minutes total) to promote more normalized postural alignment, gait pattern. Pt reported decreased soreness in R hip following stretch.  Neuro Re-ed: - In tall kneeling with RUE support at physioball, pt performed squats x12 reps for proximal stability, to address L pelvic retraction during standing/gait. Mirror utilized for visual feedback, postural awareness; cueing focused on full B hip extension. Increased time required for transition into/out of tall kneeling position.  Therapeutic Activities: - Pt performed functional ambulation 2 x100'  over indoor, level surfaces and over uneven asphalt with SPC and supervision to min guard secondary to intermittent L toe drag.  - Outside of PT clinic, pt performed transfer into personal car x1 trial (number of trials limited by hot weather) with RUE support at handle (above door) and using small aerobics step and requiring mod A of daughter (provided steadying assist and managed LLE using personal gait belt). Noted minimal distance between seat and dash board, which contributed to pt difficulty bringing LE's into car. When cued to scoot posteriorly in seat, pt  demonstrated difficulty with pelvic dissociation, reciprocal scooting technique. Approximate distance from ground to floor board: 13"; from ground to seat: 31".                         PT Short Term Goals - 01/11/15 1516    PT SHORT TERM GOAL #1   Title Pt will perform HEP with mod I using paper handout to facilitate carryover of home exercises. Target date: 01/25/15.   Time --   Period --   Status On-going   PT SHORT TERM GOAL #2   Title Pt will increase Berg Balance Scale score from 36/56 to 39/56 to progress toward significant improvement in functional standing balance.  Target date: 01/25/15   Time --   Period --   Status On-going   PT SHORT TERM GOAL #3   Title Pt will improve gait speed from 1.17 ft/sec to 1.47 ft/sec to demonstrate improved efficiency of ambulation.  Target date: 01/25/15.   Time --   Period --   Status On-going   PT SHORT TERM GOAL #4   Title --   Baseline --   PT SHORT TERM GOAL #5   Title --   Baseline --           PT Long Term Goals - 01/11/15 1517    PT LONG TERM GOAL #1   Title Pt will improve Berg score from 36/56 to 42/56 to demonstrate significant improvement in functional standing balance. Target: 02/22/15.   Status On-going   PT LONG TERM GOAL #2   Title Pt will improve gait speed from 1.17 ft/sec to 1.77 ft/sec to indicate decreased falls risk. Target: 02/22/15.   Status On-going   PT LONG TERM GOAL #3   Title Pt will ambulate >300' over unlevel surfaces with LRAD and mod I without overt LOB to indicate safety with community ambulation. Target: 02/22/15.   Status On-going   PT LONG TERM GOAL #4   Title Pt will traverse community surfaces (standard curb step, inclined/declined sidewalk) with mod I using LRAD to indicate increased stability with community mobility. Target: 02/22/15.   Status On-going   PT LONG TERM GOAL #5   Title Pt will transfer into/out of personal car with mod I using LRAD. Target: 02/22/15.   Baseline Distance  from ground to floor board: 13"; from ground to seat: 31".   Status On-going   PT LONG TERM GOAL #6   Title --               Plan - 01/11/15 1510    Clinical Impression Statement Pt required mod A of daughter for transfer into/out of personal SUV during this session. Independence with car transfer limited by pt difficulty with reciprocal scooting (backwards into seat) and L hip/knee flexion for LLE management. Continue per POC.   Pt will benefit from skilled therapeutic intervention in order to improve on the  following deficits Abnormal gait;Decreased coordination;Decreased activity tolerance;Decreased balance;Decreased mobility;Decreased endurance;Decreased strength;Increased edema;Impaired sensation;Impaired flexibility;Impaired perceived functional ability;Impaired UE functional use;Impaired tone;Dizziness;Decreased range of motion;Postural dysfunction   Rehab Potential Good   PT Frequency 2x / week   PT Duration 8 weeks   PT Treatment/Interventions Gait training;Therapeutic exercise;Patient/family education;Balance training;Stair training;Neuromuscular re-education;Therapeutic activities;Functional mobility training;Manual techniques;Vestibular;ADLs/Self Care Home Management;Orthotic Fit/Training   PT Next Visit Plan Address pelvic dissociation, reciprocal scooting, and L hip/knee flexion to address car transfer. May also simulate car transfer (distance ground to floor board: 13"; from ground to seat: 31"). L glute med/max activation, selective control in LLE.   Consulted and Agree with Plan of Care Patient;Family member/caregiver   Family Member Consulted daughter, Delphia Grates        Problem List Patient Active Problem List   Diagnosis Date Noted  . Syncope 12/06/2014  . Spastic hemiplegia affecting nondominant side 07/25/2014  . 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 Left hemiparesis 06/10/2014  . Left-sided neglect 06/10/2014  .  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 Morbid obesity- BMI 40 06/06/2014  . Acute respiratory failure with hypoxia 06/05/2014  . Cerebral infarction due to occlusion of right middle cerebral artery 06/03/2014  . Cerebral infarct 06/03/2014  . Altered mental status 06/03/2014  . GERD (gastroesophageal reflux disease) 04/12/2014  . Benign essential HTN 02/07/2014  . Dyslipidemia- statin intol 02/07/2014  . Chest pain- Low risk Myoview July 2015 02/06/2014    Billie Ruddy, PT, DPT [facility] [address] [place], [place], [address] Phone: [phone]   Fax:  [phone] 01/11/2015, 3:30 PM

## 2015-01-11 NOTE — Patient Instructions (Signed)
      Vinegar Soak for Soft Feet & Hands  . Mix 1 cup white distilled vinegar and 2 gallons warm water together.   Domingo Madeira your feet for 45 minutes in this mixture.   . If unable to soak feet, soak towels in vinegar water and wrap feet with towels soaked in vinegar water.  . Now use a pumice stone or dry towel to remove the dead skin from heels and callused areas of your feet.   You should have soft and supple feet now.

## 2015-01-13 ENCOUNTER — Ambulatory Visit: Payer: Medicare Other | Admitting: Occupational Therapy

## 2015-01-13 ENCOUNTER — Ambulatory Visit: Payer: Medicare Other | Admitting: Physical Therapy

## 2015-01-13 ENCOUNTER — Encounter: Payer: Self-pay | Admitting: Occupational Therapy

## 2015-01-13 DIAGNOSIS — R293 Abnormal posture: Secondary | ICD-10-CM

## 2015-01-13 DIAGNOSIS — G8114 Spastic hemiplegia affecting left nondominant side: Secondary | ICD-10-CM | POA: Diagnosis not present

## 2015-01-13 DIAGNOSIS — H539 Unspecified visual disturbance: Secondary | ICD-10-CM | POA: Diagnosis not present

## 2015-01-13 DIAGNOSIS — Z7409 Other reduced mobility: Secondary | ICD-10-CM

## 2015-01-13 DIAGNOSIS — R269 Unspecified abnormalities of gait and mobility: Secondary | ICD-10-CM | POA: Diagnosis not present

## 2015-01-13 DIAGNOSIS — M25512 Pain in left shoulder: Secondary | ICD-10-CM | POA: Diagnosis not present

## 2015-01-13 DIAGNOSIS — R4189 Other symptoms and signs involving cognitive functions and awareness: Secondary | ICD-10-CM | POA: Diagnosis not present

## 2015-01-13 NOTE — Therapy (Signed)
[facility] [address] [place], [place], [address] Phone: [phone]   Fax:  [phone]  Occupational Therapy Treatment  Patient Details  Name: Mary Le MRN: [id number] Date of Birth: 05/21/1946 Referring Provider:  [name], MD  Encounter Date: 01/13/2015      OT End of Session - 01/13/15 1427    Visit Number 4   Number of Visits 17   Date for OT Re-Evaluation 02/25/15   Authorization Type medicare - needs G code!   Authorization Time Period 60 days ( anticipate d/c after 6 weeks depending on progress)   Authorization - Visit Number 4   Authorization - Number of Visits 10   OT Start Time 1315   OT Stop Time 1400   OT Time Calculation (min) 45 min   Activity Tolerance Patient tolerated treatment well   Behavior During Therapy W[facility] for tasks assessed/performed      Past Medical History  Diagnosis Date  . Hypertension   . Hypercholesteremia   . Back pain     arthritis  . CVA (cerebral infarction)   . Fall 11/2014    fell in bathroom, uses a cane  . Joint pain     back, history of   . Weakness     Past Surgical History  Procedure Laterality Date  . Appendectomy    . Tonsillectomy    . Hemorroidectomy    . Tubal ligation    . Radiology with anesthesia N/A 06/03/2014    Procedure: RADIOLOGY WITH ANESTHESIA;  Surgeon: [name], MD;  Location: [facility];  Service: Radiology;  Laterality: N/A;  . Tee without cardioversion N/A 06/07/2014    Procedure: TRANSESOPHAGEAL ECHOCARDIOGRAM (TEE);  Surgeon: [name], MD;  Location: [facility] ENDOSCOPY;  Service: Cardiovascular;  Laterality: N/A;  . Loop recorder implant  06/07/2014    MDT LINQ implanted by Dr [name] for cryptogenic stroke  . Loop recorder implant N/A 06/07/2014    Procedure: LOOP RECORDER IMPLANT;  Surgeon: [name], MD;  Location: [facility] [place] CATH LAB;  Service: Cardiovascular;  Laterality: N/A;    There were no vitals filed for  this visit.  Visit Diagnosis:  Spastic hemiplegia affecting left nondominant side      Subjective Assessment - 01/13/15 1417    Subjective  I think my arm is looser in the elbow and shoulder.  I really want to see my hand move.   Pertinent History see epic snapshot;  HTN, fluctuating BP since stroke   Patient Stated Goals get in and out of the tub, I want to take a bath, use left arm   Currently in Pain? No/denies                      OT Treatments/Exercises (OP) - 01/13/15 0001    ADLs   Bathing Patient asking about the possibility of getting into and out of the tub.  Practiced simulated tub transfer.  Patient able to safely transition form sitting to down in half kneeling, and then tall kneeling.  Patient unable to transfer from half kneeling to standing without significant (max) physical assistance.  Patient able to see this is an unsafe task at this point in time.  Patient joked, 'if I tried this at home, this would become a family affair."    Neurological Re-education Exercises   Other Exercises 1 Neuromuscular reeducation following active stretch to left shoulder.  Patient able to maintain position in sidelying, and  isolate elbow flexion and extension, and slight wrist flexion and extension.  No discernible movement in fingers, except thumb able to flex to complete lateral pinch against index finger.                  OT Education - 01/13/15 1426    Education provided Yes   Education Details gentle weight bearing through heel of hand   Person(s) Educated Patient   Methods Explanation;Demonstration   Comprehension Verbalized understanding;Returned demonstration          OT Short Term Goals - 01/11/15 1542    OT SHORT TERM GOAL #1   Title I with updated HEP   Time 4   Period Weeks   Status On-going   OT SHORT TERM GOAL #2   Title Pt will donn shirt with setup consistently   Time 4   Period Weeks   Status On-going   OT SHORT TERM GOAL #3   Title Pt  will incorporate LUE into ADLS/ IADLS at least 16% of the time as a stabilizer/ gross A with pain less than or equal to 3/10   Time 4   Period Weeks   Status On-going   OT SHORT TERM GOAL #4   Title Pt will assist with light home management / meal prep at least 3x week.   Time 4   Period Weeks   Status On-going           OT Long Term Goals - 01/11/15 1543    OT LONG TERM GOAL #1   Title Pt will demonstrate ability to stand modified indpendently for light home management/ cooking x 20 mins without LOB or rest break   Time 8   Period Weeks   Status On-going   OT LONG TERM GOAL #2   Title Pt will increase A/ROM shoulder flexion to 60* consistently for increased ease with bathing and dressing.   Time 8   Period Weeks   Status On-going   OT LONG TERM GOAL #3   Title Pt will demonstrate A/ROM elbow extension to -25 for increased ease with ADLs.   Time 8   Period Weeks   Status On-going   OT LONG TERM GOAL #4   Title Pt will donn pants with set up only on a consistent basis.   Time 8   Period Weeks   Status On-going               Plan - 01/13/15 1427    Clinical Impression Statement Patient showing improved isolated elbow and shoulder motion following facilitation.  Patient eager for functional improvement s/p botox injection in left UE.   Pt will benefit from skilled therapeutic intervention in order to improve on the following deficits (Retired) Decreased activity tolerance;Decreased balance;Decreased cognition;Decreased coordination;Decreased range of motion;Decreased safety awareness;Decreased mobility;Decreased knowledge of use of DME;Decreased strength;Increased edema;Impaired UE functional use;Impaired tone;Pain;Impaired sensation;Impaired perceived functional ability;Improper body mechanics;Obesity   Rehab Potential Good   Clinical Impairments Affecting Rehab Potential pain, spasticity, decreased balance, decrease activity tolerance   OT Frequency 2x / week   OT  Duration 8 weeks   OT Treatment/Interventions Self-care/ADL training;Ultrasound;Moist Heat;Fluidtherapy;Therapeutic exercise;Neuromuscular education;Energy conservation;Manual Therapy;Functional Mobility Training;DME and/or AE instruction;Passive range of motion;Visual/perceptual remediation/compensation;Cognitive remediation/compensation;Therapeutic activities;Splinting;Patient/family education;Balance training   Plan left UE alignment and neuro re-ed, assess splint and elbow brace - she should bring   Consulted and Agree with Plan of Care Patient        Problem List Patient Active Problem List  Diagnosis Date Noted  . Syncope 12/06/2014  . Spastic hemiplegia affecting nondominant side 07/25/2014  . 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 Left hemiparesis 06/10/2014  . Left-sided neglect 06/10/2014  . 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 Morbid obesity- BMI 40 06/06/2014  . Acute respiratory failure with hypoxia 06/05/2014  . Cerebral infarction due to occlusion of right middle cerebral artery 06/03/2014  . Cerebral infarct 06/03/2014  . Altered mental status 06/03/2014  . GERD (gastroesophageal reflux disease) 04/12/2014  . Benign essential HTN 02/07/2014  . Dyslipidemia- statin intol 02/07/2014  . Chest pain- Low risk Myoview July 2015 02/06/2014    Mariah Milling, OTR/L 01/13/2015, 2:31 PM  [facility] [address] [place], [place], [address] Phone: [phone]   Fax:  [phone]

## 2015-01-13 NOTE — Therapy (Signed)
[facility] [address] [place], [place], [address] Phone: [phone]   Fax:  [phone]  Physical Therapy Treatment  Patient Details  Name: Mary Le MRN: [id number] Date of Birth: October 06, 1945 Referring Provider:  [name], MD   01/13/15 1426  PT Visits / Re-Eval  Visit Number 5  Number of Visits 17  Date for PT Re-Evaluation 02/26/15  Authorization  Authorization Type Medicare - G Codes required every 10 visits  PT Time Calculation  PT Start Time 9622  PT Stop Time 1445  PT Time Calculation (min) 40 min  PT - End of Session  Equipment Utilized During Treatment Gait belt  Activity Tolerance Patient tolerated treatment well  Behavior During Therapy [facility] [place] [facility] for tasks assessed/performed    Encounter Date: 01/13/2015     01/13/15 1413  Symptoms/Limitations  Subjective No falls or pain to report.  Pain Assessment  Currently in Pain? No/denies  Pain Score 0    Past Medical History  Diagnosis Date  . Hypertension   . Hypercholesteremia   . Back pain     arthritis  . CVA (cerebral infarction)   . Fall 11/2014    fell in bathroom, uses a cane  . Joint pain     back, history of   . Weakness     Past Surgical History  Procedure Laterality Date  . Appendectomy    . Tonsillectomy    . Hemorroidectomy    . Tubal ligation    . Radiology with anesthesia N/A 06/03/2014    Procedure: RADIOLOGY WITH ANESTHESIA;  Surgeon: [name], MD;  Location: [facility];  Service: Radiology;  Laterality: N/A;  . Tee without cardioversion N/A 06/07/2014    Procedure: TRANSESOPHAGEAL ECHOCARDIOGRAM (TEE);  Surgeon: [name], MD;  Location: [facility] ENDOSCOPY;  Service: Cardiovascular;  Laterality: N/A;  . Loop recorder implant  06/07/2014    MDT LINQ implanted by Dr [name] for cryptogenic stroke  . Loop recorder implant N/A 06/07/2014    Procedure: LOOP RECORDER IMPLANT;  Surgeon: [name], MD;   Location: [place] CATH LAB;  Service: Cardiovascular;  Laterality: N/A;    There were no vitals filed for this visit.  Visit Diagnosis:  Abnormality of gait  Posture abnormality  Impaired functional mobility and activity tolerance   Treatment: Neuro Re-ed: Seated edge of mat: Reciprocal scooting fwd/bwd Problem solving appropirate stool to used in/out of car  Quadruped over p-roll: Walking forward/backward on knees, cues/emphasis on lifting off mat with adavancement and on reciprocal pattern.  Forearm prop on eleveated mat Hip/knee flex/ext with foot on pillow case on slide board: sliding foot forward/backward's with emphasis on hip/knee flexion and extension.  Seated in chair without arm rest. PWR moves: seated tepping out/in with emphasis on lifting leg each time for increased hip strengthening and to further work on motions needed to step in/out of car/tub.        PT Short Term Goals - 01/11/15 1516    PT SHORT TERM GOAL #1   Title Pt will perform HEP with mod I using paper handout to facilitate carryover of home exercises. Target date: 01/25/15.   Time --   Period --   Status On-going   PT SHORT TERM GOAL #2   Title Pt will increase Berg Balance Scale score from 36/56 to 39/56 to progress toward significant improvement in functional standing balance.  Target date: 01/25/15   Time --   Period --   Status On-going  PT SHORT TERM GOAL #3   Title Pt will improve gait speed from 1.17 ft/sec to 1.47 ft/sec to demonstrate improved efficiency of ambulation.  Target date: 01/25/15.   Time --   Period --   Status On-going   PT SHORT TERM GOAL #4   Title --   Baseline --   PT SHORT TERM GOAL #5   Title --   Baseline --           PT Long Term Goals - 01/11/15 1517    PT LONG TERM GOAL #1   Title Pt will improve Berg score from 36/56 to 42/56 to demonstrate significant improvement in functional standing balance. Target: 02/22/15.   Status On-going   PT LONG TERM GOAL #2    Title Pt will improve gait speed from 1.17 ft/sec to 1.77 ft/sec to indicate decreased falls risk. Target: 02/22/15.   Status On-going   PT LONG TERM GOAL #3   Title Pt will ambulate >300' over unlevel surfaces with LRAD and mod I without overt LOB to indicate safety with community ambulation. Target: 02/22/15.   Status On-going   PT LONG TERM GOAL #4   Title Pt will traverse community surfaces (standard curb step, inclined/declined sidewalk) with mod I using LRAD to indicate increased stability with community mobility. Target: 02/22/15.   Status On-going   PT LONG TERM GOAL #5   Title Pt will transfer into/out of personal car with mod I using LRAD. Target: 02/22/15.   Baseline Distance from ground to floor board: 13"; from ground to seat: 31".   Status On-going   PT LONG TERM GOAL #6   Title --        01/13/15 1426  Plan  Clinical Impression Statement Continued to work on isolating her pelvic movement's from each other and her trunk to improve her abiltiy to get in/out of her familes car's. Problem solved this further by discussing use of higher step stool to allow her increased use of her legs to scoot back into the car. Pt making steady progress toward goals.                 Pt will benefit from skilled therapeutic intervention in order to improve on the following deficits Abnormal gait;Decreased coordination;Decreased activity tolerance;Decreased balance;Decreased mobility;Decreased endurance;Decreased strength;Increased edema;Impaired sensation;Impaired flexibility;Impaired perceived functional ability;Impaired UE functional use;Impaired tone;Dizziness;Decreased range of motion;Postural dysfunction  Rehab Potential Good  PT Frequency 2x / week  PT Duration 8 weeks  PT Treatment/Interventions Gait training;Therapeutic exercise;Patient/family education;Balance training;Stair training;Neuromuscular re-education;Therapeutic activities;Functional mobility training;Manual  techniques;Vestibular;ADLs/Self Care Home Management;Orthotic Fit/Training  PT Next Visit Plan Continue toward goals: gait, balance, and strengthening. continue to work on dissociation of pelvic movements as well  Consulted and Agree with Plan of Care Patient;Family member/caregiver  Family Member Consulted daughter, Mary Le     Problem List Patient Active Problem List   Diagnosis Date Noted  . Syncope 12/06/2014  . Spastic hemiplegia affecting nondominant side 07/25/2014  . 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 Left hemiparesis 06/10/2014  . Left-sided neglect 06/10/2014  . 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 Morbid obesity- BMI 40 06/06/2014  . Acute respiratory failure with hypoxia 06/05/2014  . Cerebral infarction due to occlusion of right middle cerebral artery 06/03/2014  . Cerebral infarct 06/03/2014  . Altered mental status 06/03/2014  . GERD (gastroesophageal reflux disease) 04/12/2014  . Benign essential HTN 02/07/2014  . Dyslipidemia- statin intol 02/07/2014  . Chest  pain- Low risk Myoview July 2015 02/06/2014    Willow Ora 01/14/2015, 11:54 PM  [facility] [address] [place], [place], [address] Phone: [phone]   Fax:  [phone]

## 2015-01-15 ENCOUNTER — Ambulatory Visit (INDEPENDENT_AMBULATORY_CARE_PROVIDER_SITE_OTHER): Payer: Medicare Other | Admitting: Internal Medicine

## 2015-01-15 ENCOUNTER — Ambulatory Visit (INDEPENDENT_AMBULATORY_CARE_PROVIDER_SITE_OTHER): Payer: Medicare Other

## 2015-01-15 VITALS — BP 138/98 | HR 80 | Temp 97.4°F | Resp 16 | Ht 64.0 in | Wt 188.4 lb

## 2015-01-15 DIAGNOSIS — R5383 Other fatigue: Secondary | ICD-10-CM

## 2015-01-15 DIAGNOSIS — R3589 Other polyuria: Secondary | ICD-10-CM

## 2015-01-15 DIAGNOSIS — R0609 Other forms of dyspnea: Secondary | ICD-10-CM | POA: Diagnosis not present

## 2015-01-15 DIAGNOSIS — E8881 Metabolic syndrome: Secondary | ICD-10-CM | POA: Diagnosis not present

## 2015-01-15 DIAGNOSIS — R358 Other polyuria: Secondary | ICD-10-CM | POA: Diagnosis not present

## 2015-01-15 DIAGNOSIS — Z79899 Other long term (current) drug therapy: Secondary | ICD-10-CM | POA: Diagnosis not present

## 2015-01-15 DIAGNOSIS — E785 Hyperlipidemia, unspecified: Secondary | ICD-10-CM | POA: Diagnosis not present

## 2015-01-15 DIAGNOSIS — R0600 Dyspnea, unspecified: Secondary | ICD-10-CM

## 2015-01-15 DIAGNOSIS — I639 Cerebral infarction, unspecified: Secondary | ICD-10-CM

## 2015-01-15 DIAGNOSIS — R634 Abnormal weight loss: Secondary | ICD-10-CM

## 2015-01-15 DIAGNOSIS — R351 Nocturia: Secondary | ICD-10-CM | POA: Diagnosis not present

## 2015-01-15 LAB — POCT CBC
Granulocyte percent: 69.3 %G (ref 37–80)
HCT, POC: 49.1 % — AB (ref 37.7–47.9)
Hemoglobin: 15.4 g/dL (ref 12.2–16.2)
Lymph, poc: 1.9 (ref 0.6–3.4)
MCH, POC: 26.4 pg — AB (ref 27–31.2)
MCHC: 31.4 g/dL — AB (ref 31.8–35.4)
MCV: 83.9 fL (ref 80–97)
MID (CBC): 0.4 (ref 0–0.9)
MPV: 7.7 fL (ref 0–99.8)
PLATELET COUNT, POC: 344 10*3/uL (ref 142–424)
POC Granulocyte: 5.2 (ref 2–6.9)
POC LYMPH %: 25.9 % (ref 10–50)
POC MID %: 4.8 %M (ref 0–12)
RBC: 5.85 M/uL — AB (ref 4.04–5.48)
RDW, POC: 14.5 %
WBC: 7.5 10*3/uL (ref 4.6–10.2)

## 2015-01-15 LAB — COMPREHENSIVE METABOLIC PANEL
ALT: 22 U/L (ref 0–35)
AST: 12 U/L (ref 0–37)
Albumin: 4 g/dL (ref 3.5–5.2)
Alkaline Phosphatase: 57 U/L (ref 39–117)
BILIRUBIN TOTAL: 0.5 mg/dL (ref 0.2–1.2)
BUN: 10 mg/dL (ref 6–23)
CO2: 29 mEq/L (ref 19–32)
Calcium: 9.5 mg/dL (ref 8.4–10.5)
Chloride: 104 mEq/L (ref 96–112)
Creat: 0.76 mg/dL (ref 0.50–1.10)
Glucose, Bld: 89 mg/dL (ref 70–99)
POTASSIUM: 4.5 meq/L (ref 3.5–5.3)
Sodium: 141 mEq/L (ref 135–145)
Total Protein: 7.1 g/dL (ref 6.0–8.3)

## 2015-01-15 LAB — POCT GLYCOSYLATED HEMOGLOBIN (HGB A1C): HEMOGLOBIN A1C: 5.2

## 2015-01-15 LAB — TSH: TSH: 1.348 u[IU]/mL (ref 0.350–4.500)

## 2015-01-15 LAB — POCT SEDIMENTATION RATE: POCT SED RATE: 30 mm/h — AB (ref 0–22)

## 2015-01-15 LAB — T4, FREE: Free T4: 0.98 ng/dL (ref 0.80–1.80)

## 2015-01-15 NOTE — Progress Notes (Signed)
Subjective:    Patient ID: Mary Le, female    DOB: 1945-08-26, 69 y.o.   MRN: [id number]  HPI  Chief Complaint  Patient presents with  . Fatigue    2 months   post CVA 6 months ago and then a fall secondary to unstable gait 2 months ago she has had marked fatigue. She has easy fatigability with shortness of breath with any activity. She requires more sleep. She often complains of exhaustion. Her daughter/caretaker has her on a diet following the stroke and she has lost weight which was intentional. There is no loss of appetite or other GI symptoms. No cough. No palpitations or tachyarrhythmias. No abdominal pain. She notes nocturia 2 or 3 and some daytime frequency but has been greatly increasing her fluids recently. She has had some peripheral edema but mainly in the left leg which was affected by her stroke.  Dr Martyn Malay Dr Carlota Raspberry PCP Patient Active Problem List   Diagnosis Date Noted  . Syncope 12/06/2014  . Spastic hemiplegia affecting nondominant side 07/25/2014  . 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 Left hemiparesis 06/10/2014  . Left-sided neglect 06/10/2014  . 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 Morbid obesity- BMI 40 06/06/2014  . Acute respiratory failure with hypoxia 06/05/2014  . Cerebral infarction due to occlusion of right middle cerebral artery 06/03/2014  . Cerebral infarct 06/03/2014  . Altered mental status 06/03/2014  . GERD (gastroesophageal reflux disease) 04/12/2014  . Benign essential HTN 02/07/2014  . Dyslipidemia- statin intol 02/07/2014  . Chest pain- Low risk Myoview July 2015 02/06/2014    Current outpatient prescriptions:  .  acetaminophen (TYLENOL) 325 MG tablet, Take 650 mg by mouth every 6 (six) hours as needed (pain). , Disp: , Rfl:  .  clopidogrel (PLAVIX) 75 MG tablet, TAKE ONE TABLET BY MOUTH ONCE DAILY, Disp: 90 tablet, Rfl: 1 .  clotrimazole (LOTRIMIN) 1  % cream, Apply 1 application topically 2 (two) times daily., Disp: 60 g, Rfl: 1 .  famotidine (PEPCID) 10 MG tablet, Take 1 tablet (10 mg total) by mouth 2 (two) times daily. (Patient taking differently: Take 10 mg by mouth daily as needed. ), Disp: 60 tablet, Rfl: 1 .  loratadine (ALAVERT) 10 MG tablet, Take 10 mg by mouth daily as needed for allergies., Disp: , Rfl:  .  methocarbamol (ROBAXIN) 500 MG tablet, Take 1 tablet (500 mg total) by mouth 3 (three) times daily. (Patient taking differently: Take 500 mg by mouth 2 (two) times daily. ), Disp: 270 tablet, Rfl: 1 .  metoprolol succinate (TOPROL-XL) 25 MG 24 hr tablet, Take 0.5 tablets (12.5 mg total) by mouth daily., Disp: 45 tablet, Rfl: 0 .  Multiple Vitamin (MULTIVITAMIN WITH MINERALS) TABS tablet, Take 1 tablet by mouth daily., Disp: , Rfl:  .  Polyethyl Glycol-Propyl Glycol (SYSTANE OP), Place 1 drop into both eyes daily as needed (dry eyeys)., Disp: , Rfl:  .  senna-docusate (SENOKOT-S) 8.6-50 MG per tablet, Take 3 tablets by mouth 2 (two) times daily. (Patient taking differently: Take 2 tablets by mouth 2 (two) times daily. ), Disp: 100 tablet, Rfl: 1 .  traMADol-acetaminophen (ULTRACET) 37.5-325 MG per tablet, Take 1 tablet by mouth 2 (two) times daily as needed for moderate pain or severe pain., Disp: 60 tablet, Rfl: 2 .  fish oil-omega-3 fatty acids 1000 MG capsule, Take 1 g by mouth 2 (two) times daily. , Disp: , Rfl:  Review of Systems  Constitutional: Positive for activity change and fatigue. Negative for fever, chills, appetite change and unexpected weight change.  HENT: Negative for trouble swallowing.   Eyes: Negative for visual disturbance.  Respiratory: Negative for cough, chest tightness and wheezing.   Cardiovascular: Positive for leg swelling. Negative for chest pain and palpitations.  Gastrointestinal: Negative for abdominal pain.  Endocrine: Positive for polyuria.  Genitourinary: Positive for frequency. Negative for  dysuria.  Musculoskeletal: Negative for back pain.  Neurological: Negative for dizziness and headaches.  Psychiatric/Behavioral: Negative for confusion, sleep disturbance and dysphoric mood.       Objective:   Physical Exam BP 138/98 mmHg  Pulse 80  Temp(Src) 97.4 F (36.3 C) (Oral)  Resp 16  Ht 5\' 4"  (1.626 m)  Wt 188 lb 6.4 oz (85.458 kg)  BMI 32.32 kg/m2  SpO2 95%  HEENT clear without thyromegaly or lymphadenopathy Heart regular without murmur Lungs clear to auscultation Left-sided contraction deformity in the arm with some weakness in both left arm and left leg//spasticity left upper extremity Trace edema left ankle Good peripheral pulses Cranial nerves II through XII intact Mood good and affect appropriate  [facility] reading (PRIMARY) by  Dr. Laney Pastor no infiltrate or effusion  Wt Readings from Last 3 Encounters:  01/15/15 188 lb 6.4 oz (85.458 kg)  12/15/14 193 lb 3.2 oz (87.635 kg)  09/09/14 204 lb 6.4 oz (92.715 kg)   Results for orders placed or performed in visit on 01/15/15  POCT CBC  Result Value Ref Range   WBC 7.5 4.6 - 10.2 K/uL   Lymph, poc 1.9 0.6 - 3.4   POC LYMPH PERCENT 25.9 10 - 50 %L   MID (cbc) 0.4 0 - 0.9   POC MID % 4.8 0 - 12 %M   POC Granulocyte 5.2 2 - 6.9   Granulocyte percent 69.3 37 - 80 %G   RBC 5.85 (A) 4.04 - 5.48 M/uL   Hemoglobin 15.4 12.2 - 16.2 g/dL   HCT, POC 49.1 (A) 37.7 - 47.9 %   MCV 83.9 80 - 97 fL   MCH, POC 26.4 (A) 27 - 31.2 pg   MCHC 31.4 (A) 31.8 - 35.4 g/dL   RDW, POC 14.5 %   Platelet Count, POC 344 142 - 424 K/uL   MPV 7.7 0 - 99.8 fL  POCT SEDIMENTATION RATE  Result Value Ref Range   POCT SED RATE 30 (A) 0 - 22 mm/hr  POCT glycosylated hemoglobin (Hb A1C)  Result Value Ref Range   Hemoglobin A1C 5.2        Assessment & Plan:  Other fatigue - Plan: Comprehensive metabolic panel/TSH  DOE (dyspnea on exertion) - Plan: Brain natriuretic peptide,   Weight loss - Plan:  POCT SEDIMENTATION RATE, TSH, T4,  free,  Nocturia - Plan: POCT glycosylated hemoglobin (Hb A1C)  CVA (cerebral vascular accident)-s/p  Dyslipidemia  Metabolic syndrome - improved by wt loss  Drug therapy - she does not take medication on a sustained basis that should make her have easy fatigability with dyspnea on exertion  Plan Contact with lab results and next step in workup Try to establish home medical services under the state plan for further stroke recovery

## 2015-01-16 LAB — BRAIN NATRIURETIC PEPTIDE: Brain Natriuretic Peptide: 21.2 pg/mL (ref 0.0–100.0)

## 2015-01-17 ENCOUNTER — Telehealth: Payer: Self-pay

## 2015-01-17 NOTE — Telephone Encounter (Signed)
Dr Laney Pastor asked me to see if I can find out what post-stroke home health services might be available for pt d/t left sided weakness and risk for falls. Info was printed out from [facility] for me to check on, but I don't see that pt is a medicaid pt, only Medicare and [employer]. Tried to call [facility] and they were closed for the day (only open until 1:30 pm CST). I will try again tomorrow.

## 2015-01-18 ENCOUNTER — Ambulatory Visit: Payer: Medicare Other | Admitting: Physical Therapy

## 2015-01-18 ENCOUNTER — Encounter: Payer: Self-pay | Admitting: Occupational Therapy

## 2015-01-18 ENCOUNTER — Ambulatory Visit: Payer: Medicare Other | Admitting: Occupational Therapy

## 2015-01-18 VITALS — BP 126/68 | HR 82

## 2015-01-18 DIAGNOSIS — G8114 Spastic hemiplegia affecting left nondominant side: Secondary | ICD-10-CM

## 2015-01-18 DIAGNOSIS — H539 Unspecified visual disturbance: Secondary | ICD-10-CM | POA: Diagnosis not present

## 2015-01-18 DIAGNOSIS — M25512 Pain in left shoulder: Secondary | ICD-10-CM | POA: Diagnosis not present

## 2015-01-18 DIAGNOSIS — R4189 Other symptoms and signs involving cognitive functions and awareness: Secondary | ICD-10-CM | POA: Diagnosis not present

## 2015-01-18 DIAGNOSIS — Z7409 Other reduced mobility: Secondary | ICD-10-CM | POA: Diagnosis not present

## 2015-01-18 DIAGNOSIS — R269 Unspecified abnormalities of gait and mobility: Secondary | ICD-10-CM

## 2015-01-18 NOTE — Telephone Encounter (Signed)
Dr Laney Pastor, I tried [place] again and their system would NOT let me talk to a representative - after trying several different ways I was hung up on by their automated phone tree. Called two different Medicare #s and finally was able to speak to someone. She stated the provider wound need to declare pt "homebound" (which by their definition means that they are not able to leave home without it being difficult and taxing. Pt would not be considered home bound if she goes shopping, out to eat, etc. She should basically be leaving home for very little ie: MD visits.). If pt is homebound, she would be eligible for whatever [facility] services that provider thinks would be appropriate (normally either RN or PT would have to be ordered to be the case manager, and then they could probably get other services in also ie: speech, OT, CNA). This would be for a temporary/limited time frame, but Medicare pt's usually are covered longer than commercial pt's (from my understanding from past [facility] employee). You would have to just put in order for [facility], which I can help with if you need it. BTW, the state assistance info that pt printed off looks like it is just for Medicaid pts, and I don't see that this pt has Medicaid.

## 2015-01-18 NOTE — Patient Instructions (Addendum)
ANKLE: Eversion, Unilateral (Band)   Place band around left foot. Keeping heel in place, raise toes of banded foot up and away from body. Do not move hip. Hold _2__ seconds. Use __green______ band. __15_ reps per set, _3__ sets per day.  Ankle Bend: Dorsiflexion / Plantar Flexion, Sitting   Sit with feet on floor. Point toes up, keeping both heels on floor. Then press toes to floor raising heels. Hold each position ___ seconds. Repeat _20__ times per session. Do __3-4_ sessions per day.

## 2015-01-18 NOTE — Therapy (Signed)
[facility] [address] [place], [place], [address] Phone: [phone]   Fax:  [phone]  Physical Therapy Treatment  Patient Details  Name: Mary Le MRN: [id number] Date of Birth: 09-29-1945 Referring Provider:  [name], MD  Encounter Date: 01/18/2015      PT End of Session - 01/18/15 1657    Visit Number 6   Number of Visits 17   Date for PT Re-Evaluation 02/26/15   Authorization Type Medicare - G Codes required every 10 visits   PT Start Time 3557   PT Stop Time 1445   PT Time Calculation (min) 40 min   Equipment Utilized During Treatment Gait belt   Activity Tolerance Patient tolerated treatment well   Behavior During Therapy [facility] for tasks assessed/performed      Past Medical History  Diagnosis Date  . Hypertension   . Hypercholesteremia   . Back pain     arthritis  . CVA (cerebral infarction)   . Fall 11/2014    fell in bathroom, uses a cane  . Joint pain     back, history of   . Weakness     Past Surgical History  Procedure Laterality Date  . Appendectomy    . Tonsillectomy    . Hemorroidectomy    . Tubal ligation    . Radiology with anesthesia N/A 06/03/2014    Procedure: RADIOLOGY WITH ANESTHESIA;  Surgeon: [name], MD;  Location: [facility];  Service: Radiology;  Laterality: N/A;  . Tee without cardioversion N/A 06/07/2014    Procedure: TRANSESOPHAGEAL ECHOCARDIOGRAM (TEE);  Surgeon: [name], MD;  Location: [facility] ENDOSCOPY;  Service: Cardiovascular;  Laterality: N/A;  . Loop recorder implant  06/07/2014    MDT LINQ implanted by Dr [name] for cryptogenic stroke  . Loop recorder implant N/A 06/07/2014    Procedure: LOOP RECORDER IMPLANT;  Surgeon: [name], MD;  Location: [facility] CATH LAB;  Service: Cardiovascular;  Laterality: N/A;    Filed Vitals:   01/18/15 1408  BP: 126/68  Pulse: 82    Visit Diagnosis:  Spastic hemiplegia affecting left nondominant  side  Abnormality of gait      Subjective Assessment - 01/18/15 1408    Subjective "It was easier to get into my car today," per pt. Pt reports no falls, no pain.    Currently in Pain? Yes   Pain Score 2    Pain Location Head   Pain Orientation Anterior   Pain Descriptors / Indicators Headache   Pain Type Acute pain   Pain Onset Today      Treatment   Checked vital signs due to pt-reported headache. Vitals (see above) WNL.  Therapeutic Exercises: - Standing on 6" step with RUE support, pt performed L pelvic depression/elevation x20 reps, x10 reps with tactile cueing at L quadratus lumborum.   Reviewed the following home exercises:  - Supine: bridging with ball squeeze for hip extensor, adductor strengthening x10 reps (x5-second holds each) with min cueing for L foot placement, L hip extensor activation - Supine, hook lying L hip flexion (from extension to neutral) x15 reps. Progressed HEP to from 10 reps to 15 reps, 3x/day to increase challenge. - When pt performing L ankle dorsiflexion resisted by Tband, noted pt had been performing resisted L ankle plantarflexion. Modified to L ankle AROM against gravity with focus on increasing dorsiflexion ROM prior to strengthening, as pt able to achieve only neutral dorsiflexion during this session. - Seated:  L ankle eversion resisted by yellow Tband x20 reps; due to ease, added L ankle eversion resisted by green Tband to HEP.                               PT Education - 01/18/15 1629    Education provided Yes   Education Details Reviewed, progressed HEP.   Person(s) Educated Patient   Methods Explanation;Demonstration;Handout   Comprehension Verbalized understanding;Returned demonstration          PT Short Term Goals - 01/18/15 1702    PT SHORT TERM GOAL #1   Title Pt will perform HEP with mod I using paper handout to facilitate carryover of home exercises. Target date: 01/25/15.   Baseline Met 01/18/15.    Status Achieved   PT SHORT TERM GOAL #2   Title Pt will increase Berg Balance Scale score from 36/56 to 39/56 to progress toward significant improvement in functional standing balance.  Target date: 01/25/15   Status On-going   PT SHORT TERM GOAL #3   Title Pt will improve gait speed from 1.17 ft/sec to 1.47 ft/sec to demonstrate improved efficiency of ambulation.  Target date: 01/25/15.   Status On-going           PT Long Term Goals - 01/11/15 1517    PT LONG TERM GOAL #1   Title Pt will improve Berg score from 36/56 to 42/56 to demonstrate significant improvement in functional standing balance. Target: 02/22/15.   Status On-going   PT LONG TERM GOAL #2   Title Pt will improve gait speed from 1.17 ft/sec to 1.77 ft/sec to indicate decreased falls risk. Target: 02/22/15.   Status On-going   PT LONG TERM GOAL #3   Title Pt will ambulate >300' over unlevel surfaces with LRAD and mod I without overt LOB to indicate safety with community ambulation. Target: 02/22/15.   Status On-going   PT LONG TERM GOAL #4   Title Pt will traverse community surfaces (standard curb step, inclined/declined sidewalk) with mod I using LRAD to indicate increased stability with community mobility. Target: 02/22/15.   Status On-going   PT LONG TERM GOAL #5   Title Pt will transfer into/out of personal car with mod I using LRAD. Target: 02/22/15.   Baseline Distance from ground to floor board: 13"; from ground to seat: 31".   Status On-going   PT LONG TERM GOAL #6   Title --               Plan - 01/18/15 1658    Clinical Impression Statement Reviewed/progressed HEP and addressed pelvic dissociation, L trunk shortening to increased pt independence with mobility (emphasis on scooting). Continue per POC.   Pt will benefit from skilled therapeutic intervention in order to improve on the following deficits Abnormal gait;Decreased coordination;Decreased activity tolerance;Decreased balance;Decreased mobility;Decreased  endurance;Decreased strength;Increased edema;Impaired sensation;Impaired flexibility;Impaired perceived functional ability;Impaired UE functional use;Impaired tone;Dizziness;Decreased range of motion;Postural dysfunction   Rehab Potential Good   PT Frequency 2x / week   PT Duration 8 weeks   PT Treatment/Interventions Gait training;Therapeutic exercise;Patient/family education;Balance training;Stair training;Neuromuscular re-education;Therapeutic activities;Functional mobility training;Manual techniques;Vestibular;ADLs/Self Care Home Management;Orthotic Fit/Training   PT Next Visit Plan Finish assessing STG's Merrilee Jansky amd gait speed). Retro walking.   Consulted and Agree with Plan of Care Patient        Problem List Patient Active Problem List   Diagnosis Date Noted  . Syncope 12/06/2014  . Spastic hemiplegia  affecting nondominant side 07/25/2014  . 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 Left hemiparesis 06/10/2014  . Left-sided neglect 06/10/2014  . 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 Morbid obesity- BMI 40 06/06/2014  . Acute respiratory failure with hypoxia 06/05/2014  . Cerebral infarction due to occlusion of right middle cerebral artery 06/03/2014  . Cerebral infarct 06/03/2014  . Altered mental status 06/03/2014  . GERD (gastroesophageal reflux disease) 04/12/2014  . Benign essential HTN 02/07/2014  . Dyslipidemia- statin intol 02/07/2014  . Chest pain- Low risk Myoview July 2015 02/06/2014    .Billie Ruddy, PT, DPT [facility] [address] [place], [place], [address] Phone: [phone]   Fax:  [phone] 01/18/2015, 5:08 PM

## 2015-01-18 NOTE — Therapy (Signed)
[facility] [address] [place], [place], [address] Phone: [phone]   Fax:  [phone]  Occupational Therapy Treatment  Patient Details  Name: Mary Le MRN: [id number] Date of Birth: 1945-09-18 Referring Provider:  [name], MD  Encounter Date: 01/18/2015      OT End of Session - 01/18/15 1641    Visit Number 5   Number of Visits 17   Date for OT Re-Evaluation 02/25/15   Authorization Type medicare - needs G code!   Authorization Time Period 60 days ( anticipate d/c after 6 weeks depending on progress)   Authorization - Visit Number 5   Authorization - Number of Visits 10   OT Start Time 1450   OT Stop Time 1540   OT Time Calculation (min) 50 min   Activity Tolerance Patient tolerated treatment well   Behavior During Therapy WFL for tasks assessed/performed      Past Medical History  Diagnosis Date  . Hypertension   . Hypercholesteremia   . Back pain     arthritis  . CVA (cerebral infarction)   . Fall 11/2014    fell in bathroom, uses a cane  . Joint pain     back, history of   . Weakness     Past Surgical History  Procedure Laterality Date  . Appendectomy    . Tonsillectomy    . Hemorroidectomy    . Tubal ligation    . Radiology with anesthesia N/A 06/03/2014    Procedure: RADIOLOGY WITH ANESTHESIA;  Surgeon: [name], MD;  Location: [facility];  Service: Radiology;  Laterality: N/A;  . Tee without cardioversion N/A 06/07/2014    Procedure: TRANSESOPHAGEAL ECHOCARDIOGRAM (TEE);  Surgeon: [name], MD;  Location: [facility] ENDOSCOPY;  Service: Cardiovascular;  Laterality: N/A;  . Loop recorder implant  06/07/2014    MDT LINQ implanted by Dr [name] for cryptogenic stroke  . Loop recorder implant N/A 06/07/2014    Procedure: LOOP RECORDER IMPLANT;  Surgeon: [name], MD;  Location: [facility] CATH LAB;  Service: Cardiovascular;  Laterality: N/A;    There were no vitals filed for  this visit.  Visit Diagnosis:  Spastic hemiplegia affecting left nondominant side  Pain in joint, shoulder region, left      Subjective Assessment - 01/18/15 1606    Subjective  My splint is good not sure if the elbow brace is still helping,.   Currently in Pain? No/denies   Pain Score 0-No pain                      OT Treatments/Exercises (OP) - 01/18/15 0001    Neurological Re-education Exercises   Other Exercises 1 Neuromuscular reeducation to address activation of left UE musculature  out of her synergistic pattern while perfoming functional mobility.  With the effort of walking, squatting, turning, patient has greater tendency to demonstrate increased tension in her left shoulder (adduction and internal rotation) and elbow flexion.  Wrist tends toward radial deviation with pronation.  With faciltiation, and slowed movement, patient abe to override synergy pattern during function.     Other Exercises 2 Patient asked to bring in left elbow brace (bean bag type) and splint (resting hand splint) to assess effectiveness currently.  Both seem to be effective at this time, and encouraged patient to continue use, and even to increase frequency with hand splint to prevent deformity in wrist and hand.  Patient able to isolate eklbow flexion  and extension, forearm pronation and supination.  Patient with flickers of thumb flexion and adduction.     Other Weight-Bearing Exercises 1 Seated - long arm weight bearing through heel of hand with careful alignment of carpal bones.  Patient with wrist pain, if alignment not preserved during weight bearing activity..  Followed with forearm, wrist and attempts at digit movement.                  OT Education - 01/18/15 1639    Education provided Yes   Education Details Assessed splint / brace effectiveness for left UE.  Patient reports wearing splint every other day currently, encouraged her to increase this to every day at least 2x/day  for 2-3 hours at a time.   Person(s) Educated Patient   Methods Explanation;Demonstration   Comprehension Verbalized understanding          OT Short Term Goals - 01/11/15 1542    OT SHORT TERM GOAL #1   Title I with updated HEP   Time 4   Period Weeks   Status On-going   OT SHORT TERM GOAL #2   Title Pt will donn shirt with setup consistently   Time 4   Period Weeks   Status On-going   OT SHORT TERM GOAL #3   Title Pt will incorporate LUE into ADLS/ IADLS at least 09% of the time as a stabilizer/ gross A with pain less than or equal to 3/10   Time 4   Period Weeks   Status On-going   OT SHORT TERM GOAL #4   Title Pt will assist with light home management / meal prep at least 3x week.   Time 4   Period Weeks   Status On-going           OT Long Term Goals - 01/11/15 1543    OT LONG TERM GOAL #1   Title Pt will demonstrate ability to stand modified indpendently for light home management/ cooking x 20 mins without LOB or rest break   Time 8   Period Weeks   Status On-going   OT LONG TERM GOAL #2   Title Pt will increase A/ROM shoulder flexion to 60* consistently for increased ease with bathing and dressing.   Time 8   Period Weeks   Status On-going   OT LONG TERM GOAL #3   Title Pt will demonstrate A/ROM elbow extension to -25 for increased ease with ADLs.   Time 8   Period Weeks   Status On-going   OT LONG TERM GOAL #4   Title Pt will donn pants with set up only on a consistent basis.   Time 8   Period Weeks   Status On-going               Plan - 01/18/15 1701    Clinical Impression Statement Patient continues to show improved range of motion and some isolated motor control in left UE.  Progressing toward short term goals.   Pt will benefit from skilled therapeutic intervention in order to improve on the following deficits (Retired) Decreased activity tolerance;Decreased balance;Decreased cognition;Decreased coordination;Decreased range of  motion;Decreased safety awareness;Decreased mobility;Decreased knowledge of use of DME;Decreased strength;Increased edema;Impaired UE functional use;Impaired tone;Pain;Impaired sensation;Impaired perceived functional ability;Improper body mechanics;Obesity   Rehab Potential Good   Clinical Impairments Affecting Rehab Potential pain, spasticity, decreased balance, decrease activity tolerance   OT Frequency 2x / week   OT Duration 8 weeks   OT Treatment/Interventions Self-care/ADL training;Ultrasound;Moist Heat;Fluidtherapy;Therapeutic  exercise;Neuromuscular education;Energy conservation;Manual Therapy;Functional Mobility Training;DME and/or AE instruction;Passive range of motion;Visual/perceptual remediation/compensation;Cognitive remediation/compensation;Therapeutic activities;Splinting;Patient/family education;Balance training   Plan weight bearing through left UE, neuromuscular reeducation left UE    Consulted and Agree with Plan of Care Patient   Family Member Consulted daughter        Problem List Patient Active Problem List   Diagnosis Date Noted  . Syncope 12/06/2014  . Spastic hemiplegia affecting nondominant side 07/25/2014  . 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 Left hemiparesis 06/10/2014  . Left-sided neglect 06/10/2014  . 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 Morbid obesity- BMI 40 06/06/2014  . Acute respiratory failure with hypoxia 06/05/2014  . Cerebral infarction due to occlusion of right middle cerebral artery 06/03/2014  . Cerebral infarct 06/03/2014  . Altered mental status 06/03/2014  . GERD (gastroesophageal reflux disease) 04/12/2014  . Benign essential HTN 02/07/2014  . Dyslipidemia- statin intol 02/07/2014  . Chest pain- Low risk Myoview July 2015 02/06/2014    Mariah Milling, OTR/L 01/18/2015, 5:04 PM  [facility] [address] [place], [place], [address] Phone: [phone]   Fax:  [phone]

## 2015-01-19 NOTE — Telephone Encounter (Signed)
Based on our nurses interaction with this program it appears that she is not eligible because she is able to do outpatient rehabilitation and is not restricted to the home. Please inform the patient's daughter that we have been unable to find a way to access this program for her mother and as far as we can tell its best to continue with her outpatient rehabilitation program  she may want to ask them about any additional things that can be done

## 2015-01-20 ENCOUNTER — Ambulatory Visit: Payer: Medicare Other | Admitting: Physical Therapy

## 2015-01-20 ENCOUNTER — Encounter: Payer: Self-pay | Admitting: Occupational Therapy

## 2015-01-20 ENCOUNTER — Ambulatory Visit: Payer: Medicare Other | Attending: Physical Medicine & Rehabilitation | Admitting: Occupational Therapy

## 2015-01-20 DIAGNOSIS — R293 Abnormal posture: Secondary | ICD-10-CM

## 2015-01-20 DIAGNOSIS — H539 Unspecified visual disturbance: Secondary | ICD-10-CM | POA: Diagnosis not present

## 2015-01-20 DIAGNOSIS — R4189 Other symptoms and signs involving cognitive functions and awareness: Secondary | ICD-10-CM | POA: Insufficient documentation

## 2015-01-20 DIAGNOSIS — M21372 Foot drop, left foot: Secondary | ICD-10-CM | POA: Diagnosis not present

## 2015-01-20 DIAGNOSIS — I69898 Other sequelae of other cerebrovascular disease: Secondary | ICD-10-CM | POA: Insufficient documentation

## 2015-01-20 DIAGNOSIS — G8114 Spastic hemiplegia affecting left nondominant side: Secondary | ICD-10-CM

## 2015-01-20 DIAGNOSIS — R531 Weakness: Secondary | ICD-10-CM | POA: Insufficient documentation

## 2015-01-20 DIAGNOSIS — M25512 Pain in left shoulder: Secondary | ICD-10-CM | POA: Diagnosis not present

## 2015-01-20 DIAGNOSIS — R269 Unspecified abnormalities of gait and mobility: Secondary | ICD-10-CM | POA: Diagnosis not present

## 2015-01-20 DIAGNOSIS — Z7409 Other reduced mobility: Secondary | ICD-10-CM | POA: Insufficient documentation

## 2015-01-20 NOTE — Therapy (Signed)
[facility] [address] [place], [place], [address] Phone: [phone]   Fax:  [phone]  Occupational Therapy Treatment  Patient Details  Name: Mary Le MRN: [id number] Date of Birth: August 24, 1945 Referring Provider:  [name], MD  Encounter Date: 01/20/2015      OT End of Session - 01/20/15 1623    Visit Number 6   Number of Visits 17   Date for OT Re-Evaluation 02/25/15   Authorization Type medicare - needs G code!   Authorization Time Period 60 days ( anticipate d/c after 6 weeks depending on progress)   Authorization - Visit Number 6   Authorization - Number of Visits 10   OT Start Time 1450   OT Stop Time 1537   OT Time Calculation (min) 47 min   Activity Tolerance Patient tolerated treatment well   Behavior During Therapy WFL for tasks assessed/performed      Past Medical History  Diagnosis Date  . Hypertension   . Hypercholesteremia   . Back pain     arthritis  . CVA (cerebral infarction)   . Fall 11/2014    fell in bathroom, uses a cane  . Joint pain     back, history of   . Weakness     Past Surgical History  Procedure Laterality Date  . Appendectomy    . Tonsillectomy    . Hemorroidectomy    . Tubal ligation    . Radiology with anesthesia N/A 06/03/2014    Procedure: RADIOLOGY WITH ANESTHESIA;  Surgeon: [name], MD;  Location: [facility];  Service: Radiology;  Laterality: N/A;  . Tee without cardioversion N/A 06/07/2014    Procedure: TRANSESOPHAGEAL ECHOCARDIOGRAM (TEE);  Surgeon: [name], MD;  Location: [facility] ENDOSCOPY;  Service: Cardiovascular;  Laterality: N/A;  . Loop recorder implant  06/07/2014    MDT LINQ implanted by Dr [name] for cryptogenic stroke  . Loop recorder implant N/A 06/07/2014    Procedure: LOOP RECORDER IMPLANT;  Surgeon: [name], MD;  Location: [facility] CATH LAB;  Service: Cardiovascular;  Laterality: N/A;    There were no vitals filed for  this visit.  Visit Diagnosis:  Spastic hemiplegia affecting left nondominant side      Subjective Assessment - 01/20/15 1608    Subjective  I have definitely been wearing the splint more often.  I try to wear it 2-3 times/day, for at least 2 hours at a time   Pertinent History see epic snapshot;  HTN, fluctuating BP since stroke   Patient Stated Goals use left arm, move fingers   Currently in Pain? No/denies   Pain Score 0-No pain                      OT Treatments/Exercises (OP) - 01/20/15 0001    ADLs   Bathing Performed shoulder and elbow stretch in supine to help achieve hand to face, hand to right arm.  Patient finally able to make contact with hand to face.  Encouraged patient to begin to incorporate her left arm into washing her right arm, abdomen and thighs as a means to enahnce active movement during each day.   Neurological Re-education Exercises   Other Exercises 1 Neuromuscular reeducation to address alignment and then isolated muscle control in one joint at a time, then 2 joints (shoulder and elbow.   Other Exercises 2 In sidelying, addressed isolated control of pro/supination, elbow flex/extension   Other Weight-Bearing Exercises 1  Seated with long arm weight bearing through heel of right hand, encouraging increased trunk activation throughout left side.  Followed with attempts to facilitate wrist flexion/extension, or mass grasp                OT Education - 01/20/15 1622    Education provided Yes   Education Details Alignment of head and neck with attempts at shoulder movement, breathing throughout exercise.   Person(s) Educated Patient   Methods Explanation;Demonstration   Comprehension Verbalized understanding          OT Short Term Goals - 01/20/15 1625    OT SHORT TERM GOAL #1   Title I with updated HEP   Status Achieved   OT SHORT TERM GOAL #2   Title Pt will donn shirt with setup consistently   Status Partially Met   OT SHORT TERM  GOAL #3   Title Pt will incorporate LUE into ADLS/ IADLS at least 97% of the time as a stabilizer/ gross A with pain less than or equal to 3/10   Status Partially Met   OT SHORT TERM GOAL #4   Title Pt will assist with light home management / meal prep at least 3x week.   Status On-going           OT Long Term Goals - 01/11/15 1543    OT LONG TERM GOAL #1   Title Pt will demonstrate ability to stand modified indpendently for light home management/ cooking x 20 mins without LOB or rest break   Time 8   Period Weeks   Status On-going   OT LONG TERM GOAL #2   Title Pt will increase A/ROM shoulder flexion to 60* consistently for increased ease with bathing and dressing.   Time 8   Period Weeks   Status On-going   OT LONG TERM GOAL #3   Title Pt will demonstrate A/ROM elbow extension to -25 for increased ease with ADLs.   Time 8   Period Weeks   Status On-going   OT LONG TERM GOAL #4   Title Pt will donn pants with set up only on a consistent basis.   Time 8   Period Weeks   Status On-going               Plan - 01/20/15 1624    Clinical Impression Statement Patient continues to show improved range of motion and some isolated motor control in left UE.  She is progressing toward short term goals   Pt will benefit from skilled therapeutic intervention in order to improve on the following deficits (Retired) Decreased activity tolerance;Decreased balance;Decreased cognition;Decreased coordination;Decreased range of motion;Decreased safety awareness;Decreased mobility;Decreased knowledge of use of DME;Decreased strength;Increased edema;Impaired UE functional use;Impaired tone;Pain;Impaired sensation;Impaired perceived functional ability;Improper body mechanics;Obesity   Rehab Potential Good   Clinical Impairments Affecting Rehab Potential pain, spasticity, decreased balance, decrease activity tolerance   OT Frequency 2x / week   OT Duration 8 weeks   OT Treatment/Interventions  Self-care/ADL training;Ultrasound;Moist Heat;Fluidtherapy;Therapeutic exercise;Neuromuscular education;Energy conservation;Manual Therapy;Functional Mobility Training;DME and/or AE instruction;Passive range of motion;Visual/perceptual remediation/compensation;Cognitive remediation/compensation;Therapeutic activities;Splinting;Patient/family education;Balance training   Plan neuromuscular reeducation. weight bearing through left UE, ?ESTIM for wrist / digit   Consulted and Agree with Plan of Care Patient        Problem List Patient Active Problem List   Diagnosis Date Noted  . Syncope 12/06/2014  . Spastic hemiplegia affecting nondominant side 07/25/2014  . 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  Left hemiparesis 06/10/2014  . Left-sided neglect 06/10/2014  . 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 Morbid obesity- BMI 40 06/06/2014  . Acute respiratory failure with hypoxia 06/05/2014  . Cerebral infarction due to occlusion of right middle cerebral artery 06/03/2014  . Cerebral infarct 06/03/2014  . Altered mental status 06/03/2014  . GERD (gastroesophageal reflux disease) 04/12/2014  . Benign essential HTN 02/07/2014  . Dyslipidemia- statin intol 02/07/2014  . Chest pain- Low risk Myoview July 2015 02/06/2014    Mariah Milling, OTR/L 01/20/2015, 4:28 PM  [facility] [address] [place], [place], [address] Phone: [phone]   Fax:  [phone]

## 2015-01-20 NOTE — Patient Instructions (Signed)
Stand in front of a stable bed (or massage table) with a stable surface in front of you in case you lose your balance. Try not to hold on while placing feet together. Try to hold for 60 seconds without losing balance. Try not to touch the surface in front of you unless you lose your balance. Try to do this 5 times per day.  AMBULATION: Walk Backward   Stand with countertop on RIGHT side. Walk backward. When stepping with RIGHT foot, be sure not to take huge steps. Try to do this exercise for the length of your countertop 6-8 times per day.  Copyright  V[employer]. All rights reserved.

## 2015-01-20 NOTE — Telephone Encounter (Signed)
Spoke to daughter and gave her info from Dr Laney Pastor. She stated pt is specifically looking for home health aide. I suggested she call a company who specializes in this such as [employer] or similar group and see if pt would qualify for any ins compensation or not. I also went over pt's labs with her and Dr's comments. She agreed to tell pt.

## 2015-01-20 NOTE — Therapy (Signed)
[facility] [address] , [place], [address] Phone: [phone]   Fax:  [phone]  Physical Therapy Treatment  Patient Details  Name: Mary Le MRN: [id number] Date of Birth: 04/01/1946 Referring Provider:  Greene, Jeffrey R, MD  Encounter Date: 01/20/2015      PT End of Session - 01/20/15 1458    Visit Number 7   Number of Visits 17   Date for PT Re-Evaluation 02/26/15   Authorization Type Medicare - G Codes required every 10 visits   PT Start Time 1401   PT Stop Time 1445   PT Time Calculation (min) 44 min   Equipment Utilized During Treatment Gait belt   Activity Tolerance Patient tolerated treatment well   Behavior During Therapy WFL for tasks assessed/performed      Past Medical History  Diagnosis Date  . Hypertension   . Hypercholesteremia   . Back pain     arthritis  . CVA (cerebral infarction)   . Fall 11/2014    fell in bathroom, uses a cane  . Joint pain     back, history of   . Weakness     Past Surgical History  Procedure Laterality Date  . Appendectomy    . Tonsillectomy    . Hemorroidectomy    . Tubal ligation    . Radiology with anesthesia N/A 06/03/2014    Procedure: RADIOLOGY WITH ANESTHESIA;  Surgeon: Sanjeev K Deveshwar, MD;  Location: [facility] OR;  Service: Radiology;  Laterality: N/A;  . Tee without cardioversion N/A 06/07/2014    Procedure: TRANSESOPHAGEAL ECHOCARDIOGRAM (TEE);  Surgeon: Brian S Crenshaw, MD;  Location: [facility] ENDOSCOPY;  Service: Cardiovascular;  Laterality: N/A;  . Loop recorder implant  06/07/2014    MDT LINQ implanted by Dr Allred for cryptogenic stroke  . Loop recorder implant N/A 06/07/2014    Procedure: LOOP RECORDER IMPLANT;  Surgeon: James D Allred, MD;  Location: [facility] CATH LAB;  Service: Cardiovascular;  Laterality: N/A;    There were no vitals filed for this visit.  Visit Diagnosis:  Abnormality of gait  Spastic hemiplegia affecting left  nondominant side  Posture abnormality   Treatment   Neuro Re-ed: - Completed Berg Balance Scale; see below for detailed findings. With increased challenge in standing (most notable with narrow BOS), pt exhibited anterior preference. - Assessed self-selected gait speed for ambulation with SPC; see below for findings.  - Retro gait with countertop at R side for safety; multimodal cueing for increased length of retro step on L. Noted significant lateral trunk lean to R side, L pelvic retraction, minimal lateral weight shift to RLE, R pelvic depression (L Trendelenburg vs passive elongation of trunk on L). Visual/verbal cueing focused on increasing RLE weightbearing and improved alignment during standing then retro gait. - Standing on foam beam (to address COG control, ankle strategy) with intermittent RUE support at parallel bars and CGA to min A for stability/balance, pt performed L anterior/posterior stepping onto/off of foam beam x 20 reps for increased lateral weight shift to R side; switched to LLE forward/retro stepping onto/off of foam beam x15 reps with multimodal cueing to decrease pelvic obliquity described above.          [facility] PT Assessment - 01/20/15 0001    Berg Balance Test   Sit to Stand Able to stand without using hands and stabilize independently   Standing Unsupported Able to stand safely 2 minutes   Sitting with Back Unsupported but Feet Supported on Floor   or Stool Able to sit safely and securely 2 minutes   Stand to Sit Sits safely with minimal use of hands   Transfers Able to transfer safely, minor use of hands   Standing Unsupported with Eyes Closed Able to stand 10 seconds safely   Standing Ubsupported with Feet Together Able to place feet together independently but unable to hold for 30 seconds  able to achieve feet together position; anterior preference   From Standing, Reach Forward with Outstretched Arm Can reach forward >12 cm safely (5")   From Standing Position,  Pick up Object from Floor Able to pick up shoe, needs supervision   From Standing Position, Turn to Look Behind Over each Shoulder Looks behind one side only/other side shows less weight shift  decreased weight shift to R side   Turn 360 Degrees Able to turn 360 degrees safely but slowly  > 6 seconds bilaterally   Standing Unsupported, Alternately Place Feet on Step/Stool Able to stand independently and safely and complete 8 steps in 20 seconds   Standing Unsupported, One Foot in Front Able to take small step independently and hold 30 seconds   Standing on One Leg Able to lift leg independently and hold equal to or more than 3 seconds   Total Score 45                              PT Education - 01/20/15 1449    Education provided Yes   Education Details STG progress.   Person(s) Educated Patient   Methods Explanation   Comprehension Verbalized understanding          PT Short Term Goals - 01/20/15 1407    PT SHORT TERM GOAL #1   Title Pt will perform HEP with mod I using paper handout to facilitate carryover of home exercises. Target date: 01/25/15.   Baseline Met 01/18/15.   Status Achieved   PT SHORT TERM GOAL #2   Title Pt will increase Berg Balance Scale score from 36/56 to 39/56 to progress toward significant improvement in functional standing balance.  Target date: 01/25/15   Baseline 7/1: 45/56 on Berg   Status Achieved   PT SHORT TERM GOAL #3   Title Pt will improve gait speed from 1.17 ft/sec to 1.47 ft/sec to demonstrate improved efficiency of ambulation.  Target date: 01/25/15.   Baseline 1.64 ft/sec on 01/20/15   Status Achieved           PT Long Term Goals - 01/20/15 1500    PT LONG TERM GOAL #1   Title Pt will improve Berg score from 36/56 to 42/56 to demonstrate significant improvement in functional standing balance. Target: 02/22/15.   Baseline Achieved 01/20/15 with score of 45/56.   Status Achieved   PT LONG TERM GOAL #2   Title Pt will improve  gait speed from 1.17 ft/sec to 1.77 ft/sec to indicate decreased falls risk. Target: 02/22/15.   Status On-going   PT LONG TERM GOAL #3   Title Pt will ambulate >300' over unlevel surfaces with LRAD and mod I without overt LOB to indicate safety with community ambulation. Target: 02/22/15.   Status On-going   PT LONG TERM GOAL #4   Title Pt will traverse community surfaces (standard curb step, inclined/declined sidewalk) with mod I using LRAD to indicate increased stability with community mobility. Target: 02/22/15.   Status On-going   PT LONG TERM GOAL #5  Title Pt will transfer into/out of personal car with mod I using LRAD. Target: 02/22/15.   Baseline Distance from ground to floor board: 13"; from ground to seat: 31".   Status On-going               Plan - 01/20/15 1548    Clinical Impression Statement Pt met all STG's and surpassed LTG for Berg score. Pt very motivated and demonstrating excellent HEP compliance. Continue per POC.   Pt will benefit from skilled therapeutic intervention in order to improve on the following deficits Abnormal gait;Decreased coordination;Decreased activity tolerance;Decreased balance;Decreased mobility;Decreased endurance;Decreased strength;Increased edema;Impaired sensation;Impaired flexibility;Impaired perceived functional ability;Impaired UE functional use;Impaired tone;Dizziness;Decreased range of motion;Postural dysfunction   Rehab Potential Good   PT Frequency 2x / week   PT Duration 8 weeks   PT Treatment/Interventions Gait training;Therapeutic exercise;Patient/family education;Balance training;Stair training;Neuromuscular re-education;Therapeutic activities;Functional mobility training;Manual techniques;Vestibular;ADLs/Self Care Home Management;Orthotic Fit/Training   PT Next Visit Plan Assess source of postural alignment (MMT); increased lateral weight shift to R side.   Consulted and Agree with Plan of Care Patient        Problem List Patient  Active Problem List   Diagnosis Date Noted  . Syncope 12/06/2014  . Spastic hemiplegia affecting nondominant side 07/25/2014  . 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 Left hemiparesis 06/10/2014  . Left-sided neglect 06/10/2014  . 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 Morbid obesity- BMI 40 06/06/2014  . Acute respiratory failure with hypoxia 06/05/2014  . Cerebral infarction due to occlusion of right middle cerebral artery 06/03/2014  . Cerebral infarct 06/03/2014  . Altered mental status 06/03/2014  . GERD (gastroesophageal reflux disease) 04/12/2014  . Benign essential HTN 02/07/2014  . Dyslipidemia- statin intol 02/07/2014  . Chest pain- Low risk Myoview July 2015 02/06/2014    Billie Ruddy, PT, DPT [facility] [address] [place], [place], [address] Phone: [phone]   Fax:  [phone] 01/20/2015, 3:51 PM

## 2015-01-24 ENCOUNTER — Ambulatory Visit: Payer: Medicare Other | Admitting: Occupational Therapy

## 2015-01-24 ENCOUNTER — Ambulatory Visit: Payer: Medicare Other | Admitting: Physical Therapy

## 2015-01-24 DIAGNOSIS — R269 Unspecified abnormalities of gait and mobility: Secondary | ICD-10-CM | POA: Diagnosis not present

## 2015-01-24 DIAGNOSIS — I69898 Other sequelae of other cerebrovascular disease: Secondary | ICD-10-CM | POA: Diagnosis not present

## 2015-01-24 DIAGNOSIS — Z7409 Other reduced mobility: Secondary | ICD-10-CM | POA: Diagnosis not present

## 2015-01-24 DIAGNOSIS — R293 Abnormal posture: Secondary | ICD-10-CM

## 2015-01-24 DIAGNOSIS — IMO0002 Reserved for concepts with insufficient information to code with codable children: Secondary | ICD-10-CM

## 2015-01-24 DIAGNOSIS — R531 Weakness: Secondary | ICD-10-CM | POA: Diagnosis not present

## 2015-01-24 DIAGNOSIS — G8114 Spastic hemiplegia affecting left nondominant side: Secondary | ICD-10-CM | POA: Diagnosis not present

## 2015-01-24 DIAGNOSIS — M25512 Pain in left shoulder: Secondary | ICD-10-CM

## 2015-01-24 NOTE — Patient Instructions (Addendum)
Hip Flexion / Knee Extension: Straight-Leg Raise (Eccentric)   Lie on back with right knee bent, left knee straight. Lift LEFT leg with knee straight. Slowly lower leg for 3-5 seconds. _10-12_ reps per set, _2-3__ sets per day. Make sure the back of your knee and the back of your heel touch the bed at the same time.

## 2015-01-24 NOTE — Therapy (Signed)
[facility] [address] [place], [place], [address] Phone: [phone]   Fax:  [phone]  Physical Therapy Treatment  Patient Details  Name: Mary Le MRN: [id number] Date of Birth: [birth year] Referring Provider:  [name], MD  Encounter Date: 01/24/2015      PT End of Session - 01/24/15 1631    Visit Number 8   Number of Visits 17   Date for PT Re-Evaluation 02/26/15   Authorization Type Medicare - G Codes required every 10 visits   PT Start Time 5277   PT Stop Time 1445   PT Time Calculation (min) 46 min   Equipment Utilized During Treatment Gait belt   Activity Tolerance Patient tolerated treatment well   Behavior During Therapy [facility] for tasks assessed/performed      Past Medical History  Diagnosis Date  . Hypertension   . Hypercholesteremia   . Back pain     arthritis  . CVA (cerebral infarction)   . Fall 11/2014    fell in bathroom, uses a cane  . Joint pain     back, history of   . Weakness     Past Surgical History  Procedure Laterality Date  . Appendectomy    . Tonsillectomy    . Hemorroidectomy    . Tubal ligation    . Radiology with anesthesia N/A 06/03/2014    Procedure: RADIOLOGY WITH ANESTHESIA;  Surgeon: [name], MD;  Location: [facility];  Service: Radiology;  Laterality: N/A;  . Tee without cardioversion N/A 06/07/2014    Procedure: TRANSESOPHAGEAL ECHOCARDIOGRAM (TEE);  Surgeon: [name], MD;  Location: [facility] ENDOSCOPY;  Service: Cardiovascular;  Laterality: N/A;  . Loop recorder implant  06/07/2014    MDT LINQ implanted by Dr [name] for cryptogenic stroke  . Loop recorder implant N/A 06/07/2014    Procedure: LOOP RECORDER IMPLANT;  Surgeon: [name], MD;  Location: [facility] CATH LAB;  Service: Cardiovascular;  Laterality: N/A;    There were no vitals filed for this visit.  Visit Diagnosis:  Abnormality of gait  Spastic hemiplegia affecting left  nondominant side  Posture abnormality      Subjective Assessment - 01/24/15 1400    Subjective "It only took about 10 seconds to get into the car today. It usually takes me about 5 minutes. I was able to put my left leg in first by myself, whereas Mary Le used to have to lift it up."   Patient Stated Goals Improve overall mobility, increase independence.   Currently in Pain? No/denies            [name] PT Assessment - 01/24/15 1629    Posture/Postural Control   Posture/Postural Control Postural limitations   Postural Limitations Rounded Shoulders;Forward head;Weight shift left;Right pelvic obliquity   Posture Comments Passive elongation of trunk on L side; lateral trunk lean to R side   Ambulation/Gait   Ambulation/Gait Yes   Ambulation/Gait Assistance 6: Modified independent (Device/Increase time);5: Supervision;4: Min guard   Ambulation/Gait Assistance Details Supervision to Min guard without AD; Mod I using SPC   Ambulation Distance (Feet) 260 Feet   Assistive device None;Straight cane   Gait Pattern Step-through pattern;Decreased step length - left;Poor foot clearance - left;Trunk rotated posteriorly on left;Lateral trunk lean to right;Decreased dorsiflexion - left     Treatment    Therapeutic Activities: - Pt performed functional ambulation x260' total; initial 45' with SPC and mod I. Remaining ambulation without AD with supervision to min  guard, intermittent L toe catch. See below for detailed description of postural/gait deviations. - Supine <> sit to L side for increased L trunk shortening; cueing focused on logroll transfer for increased efficiency of movement, to increase trunk shortening on L with transition from L side lying > sit. Transitioned to - Seated <> L forearm propping for increased L trunk shortening. Performed x6 reps prior to onset of pain in L forearm. - See Patient Education above.  Therapeutic Exercises: - L clamshells 2 x8 reps (to patient fatigue) with  verbal/tactile cueing for hip alignment, technique. Instructed pt to perform clamshells bilaterally at home, as pt reports having been performing R clamshells only. - Upon further assessment of origin of intermittent L toe drag during LLE advancement, noted 4/5 strength in L dorsiflexors; however, pt able to perform 1-2 degrees of L ankle dorsiflexion dur e to heel cord tightness. Therefore, explained/demonstrated L plantar flexor stretch in seated with sheet; however, pt demonstrated difficulty utilizing RUE to pull L ankle into enough dorsiflexion to achieve adequate stretch. Transitioned to standing gastrocnemius stretch with RUE support; however, pt unable to maintain L knee extension during stretch. Finally, attempted seated L gastroc stretch using step, but pt unable to maintain safe/neutral foot/ankle positioning. - Supine: L straight leg raise x10 reps then x5 reps with verbal/tactile cueing focused on avoiding L quadriceps lag. Added to HEP.                          PT Education - 01/24/15 1626    Education provided Yes   Education Details HEP: added L straight leg raise for hip flexor strengthening. Instructed pt to perform clamshells bilaterally (as she has been performing only on R side).   Person(s) Educated Patient   Methods Explanation;Demonstration;Handout   Comprehension Verbalized understanding;Returned demonstration          PT Short Term Goals - 01/20/15 1407    PT SHORT TERM GOAL #1   Title Pt will perform HEP with mod I using paper handout to facilitate carryover of home exercises. Target date: 01/25/15.   Baseline Met 01/18/15.   Status Achieved   PT SHORT TERM GOAL #2   Title Pt will increase Berg Balance Scale score from 36/56 to 39/56 to progress toward significant improvement in functional standing balance.  Target date: 01/25/15   Baseline 7/1: 45/56 on Berg   Status Achieved   PT SHORT TERM GOAL #3   Title Pt will improve gait speed from 1.17  ft/sec to 1.47 ft/sec to demonstrate improved efficiency of ambulation.  Target date: 01/25/15.   Baseline 1.64 ft/sec on 01/20/15   Status Achieved           PT Long Term Goals - 01/20/15 1500    PT LONG TERM GOAL #1   Title Pt will improve Berg score from 36/56 to 42/56 to demonstrate significant improvement in functional standing balance. Target: 02/22/15.   Baseline Achieved 01/20/15 with score of 45/56.   Status Achieved   PT LONG TERM GOAL #2   Title Pt will improve gait speed from 1.17 ft/sec to 1.77 ft/sec to indicate decreased falls risk. Target: 02/22/15.   Status On-going   PT LONG TERM GOAL #3   Title Pt will ambulate >300' over unlevel surfaces with LRAD and mod I without overt LOB to indicate safety with community ambulation. Target: 02/22/15.   Status On-going   PT LONG TERM GOAL #4   Title Pt will  traverse community surfaces (standard curb step, inclined/declined sidewalk) with mod I using LRAD to indicate increased stability with community mobility. Target: 02/22/15.   Status On-going   PT LONG TERM GOAL #5   Title Pt will transfer into/out of personal car with mod I using LRAD. Target: 02/22/15.   Baseline Distance from ground to floor board: 13"; from ground to seat: 31".   Status On-going               Plan - 01/24/15 1632    Clinical Impression Statement Addressed postural/gait abnormalities to increase stability/independence with functional mobility. Noted that intermittent L toe catch during gait due to limited L ankle dorsiflexion appears to be attributed to decreased selective control of L ankle dorsiflexion and decreased extensibility in L heel cord. Attempted to initiate L plantarflexor stretch; however, had difficulty performing all stretches. Will likely need to start with caregiver-assisted stretch. Continue per POC.    Pt will benefit from skilled therapeutic intervention in order to improve on the following deficits Abnormal gait;Decreased coordination;Decreased  activity tolerance;Decreased balance;Decreased mobility;Decreased endurance;Decreased strength;Increased edema;Impaired sensation;Impaired flexibility;Impaired perceived functional ability;Impaired UE functional use;Impaired tone;Dizziness;Decreased range of motion;Postural dysfunction   Rehab Potential Good   PT Frequency 2x / week   PT Duration 8 weeks   PT Treatment/Interventions Gait training;Therapeutic exercise;Patient/family education;Balance training;Stair training;Neuromuscular re-education;Therapeutic activities;Functional mobility training;Manual techniques;Vestibular;ADLs/Self Care Home Management;Orthotic Fit/Training   PT Next Visit Plan Trial L FootUp brace. Community obstacles (curb step, incline, decline).    PT Home Exercise Plan Current HEP: removed L ankle dorsiflexion and eversion exercises due to pt inability to pt difficulty performing correctly. For increased L heel cord extensibility, consider either 1) caregiver-assisted stretch or 2) standing on incline.Added supine L SLR.   Consulted and Agree with Plan of Care Patient        Problem List Patient Active Problem List   Diagnosis Date Noted  . Syncope 12/06/2014  . Spastic hemiplegia affecting nondominant side 07/25/2014  . 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 Left hemiparesis 06/10/2014  . Left-sided neglect 06/10/2014  . 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 Morbid obesity- BMI 40 06/06/2014  . Acute respiratory failure with hypoxia 06/05/2014  . Cerebral infarction due to occlusion of right middle cerebral artery 06/03/2014  . Cerebral infarct 06/03/2014  . Altered mental status 06/03/2014  . GERD (gastroesophageal reflux disease) 04/12/2014  . Benign essential HTN 02/07/2014  . Dyslipidemia- statin intol 02/07/2014  . Chest pain- Low risk Myoview July 2015 02/06/2014    Billie Ruddy, PT, DPT [facility] [address] [place], [place], [address] Phone: [phone]   Fax:  [phone] 01/24/2015, 4:43 PM

## 2015-01-24 NOTE — Therapy (Signed)
[facility] [address] [place], [place], [address] Phone: [phone]   Fax:  [phone]  Occupational Therapy Treatment  Patient Details  Name: Mary Le MRN: [id number] Date of Birth: February 13, 1946 Referring Provider:  [name], MD  Encounter Date: 01/24/2015      OT End of Session - 01/24/15 1718    Visit Number 7   Number of Visits 17   Authorization Type medicare - needs G code!   Authorization Time Period 60 days ( anticipate d/c after 6 weeks depending on progress)   Authorization - Visit Number 7   Authorization - Number of Visits 10   OT Start Time 1451   OT Stop Time 1530   OT Time Calculation (min) 39 min   Activity Tolerance Patient tolerated treatment well   Behavior During Therapy [facility] for tasks assessed/performed      Past Medical History  Diagnosis Date  . Hypertension   . Hypercholesteremia   . Back pain     arthritis  . CVA (cerebral infarction)   . Fall 11/2014    fell in bathroom, uses a cane  . Joint pain     back, history of   . Weakness     Past Surgical History  Procedure Laterality Date  . Appendectomy    . Tonsillectomy    . Hemorroidectomy    . Tubal ligation    . Radiology with anesthesia N/A 06/03/2014    Procedure: RADIOLOGY WITH ANESTHESIA;  Surgeon: [name], MD;  Location: [facility];  Service: Radiology;  Laterality: N/A;  . Tee without cardioversion N/A 06/07/2014    Procedure: TRANSESOPHAGEAL ECHOCARDIOGRAM (TEE);  Surgeon: [name], MD;  Location: [facility] ENDOSCOPY;  Service: Cardiovascular;  Laterality: N/A;  . Loop recorder implant  06/07/2014    MDT LINQ implanted by Dr [name] for cryptogenic stroke  . Loop recorder implant N/A 06/07/2014    Procedure: LOOP RECORDER IMPLANT;  Surgeon: [name], MD;  Location: [facility] CATH LAB;  Service: Cardiovascular;  Laterality: N/A;    There were no vitals filed for this visit.  Visit Diagnosis:   Weakness due to cerebrovascular accident  Impaired functional mobility and activity tolerance  Pain in joint, shoulder region, left  Spastic hemiplegia affecting left nondominant side      Subjective Assessment - 01/24/15 1728    Subjective  Pt reports wearing resting hand splint more.   Patient is accompained by: Family member   Pertinent History see epic snapshot;  HTN, fluctuating BP since stroke   Patient Stated Goals use left arm, move fingers   Currently in Pain? No/denies                Treatment: Neuro musccular re-ed in supine for AA/ROM shoulder flexion elbow extension followed by trunk rotation with diagonals, min v.c.facilitation.  Seated shoulder flexion/ elbow extension AA/ROM with UE Ranger and weightbearing through tilted stool, min v.c./ facilitation. Gentle P/ROM finger extension and wrist ulnar deviation.  Discussed with pt/ daughter inability to use estim due to loop recorder. Therapist plans to make paddle splint next visit. Pt demonstrates improved elbow extension/ shoulder flexion A/ROM.                 OT Short Term Goals - 01/20/15 1625    OT SHORT TERM GOAL #1   Title I with updated HEP   Status Achieved   OT SHORT TERM GOAL #2   Title Pt will donn  shirt with setup consistently   Status Partially Met   OT SHORT TERM GOAL #3   Title Pt will incorporate LUE into ADLS/ IADLS at least 19% of the time as a stabilizer/ gross A with pain less than or equal to 3/10   Status Partially Met   OT SHORT TERM GOAL #4   Title Pt will assist with light home management / meal prep at least 3x week.   Status On-going           OT Long Term Goals - 01/11/15 1543    OT LONG TERM GOAL #1   Title Pt will demonstrate ability to stand modified indpendently for light home management/ cooking x 20 mins without LOB or rest break   Time 8   Period Weeks   Status On-going   OT LONG TERM GOAL #2   Title Pt will increase A/ROM shoulder flexion to 60*  consistently for increased ease with bathing and dressing.   Time 8   Period Weeks   Status On-going   OT LONG TERM GOAL #3   Title Pt will demonstrate A/ROM elbow extension to -25 for increased ease with ADLs.   Time 8   Period Weeks   Status On-going   OT LONG TERM GOAL #4   Title Pt will donn pants with set up only on a consistent basis.   Time 8   Period Weeks   Status On-going               Plan - 01/24/15 1717    Clinical Impression Statement Pt is progressing towards goals with improving A/ROM and control in LUE.   Rehab Potential Good   Clinical Impairments Affecting Rehab Potential pain, spasticity, decreased balance, decrease activity tolerance   OT Frequency 2x / week   OT Duration 8 weeks   OT Treatment/Interventions Self-care/ADL training;Ultrasound;Moist Heat;Fluidtherapy;Therapeutic exercise;Neuromuscular education;Energy conservation;Manual Therapy;Functional Mobility Training;DME and/or AE instruction;Passive range of motion;Visual/perceptual remediation/compensation;Cognitive remediation/compensation;Therapeutic activities;Splinting;Patient/family education;Balance training   Plan neuro muscular re-ed   OT Home Exercise Plan supine LUE sidelying, and trunk rotation- issued 10/06/14   Consulted and Agree with Plan of Care Patient   Family Member Consulted daughter        Problem List Patient Active Problem List   Diagnosis Date Noted  . Syncope 12/06/2014  . Spastic hemiplegia affecting nondominant side 07/25/2014  . 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 Left hemiparesis 06/10/2014  . Left-sided neglect 06/10/2014  . 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 Morbid obesity- BMI 40 06/06/2014  . Acute respiratory failure with hypoxia 06/05/2014  . Cerebral infarction due to occlusion of right middle cerebral artery 06/03/2014  . Cerebral infarct 06/03/2014  . Altered mental status  06/03/2014  . GERD (gastroesophageal reflux disease) 04/12/2014  . Benign essential HTN 02/07/2014  . Dyslipidemia- statin intol 02/07/2014  . Chest pain- Low risk Myoview July 2015 02/06/2014    Damarrion Mimbs 01/24/2015, 5:30 PM Theone Murdoch, OTR/L [phone] Phone: [phone] 5:30 PM 01/24/2015 [facility] [address] [place], [place], [address] Phone: [phone]   Fax:  [phone]

## 2015-01-25 ENCOUNTER — Encounter: Payer: Self-pay | Admitting: Internal Medicine

## 2015-01-27 ENCOUNTER — Telehealth: Payer: Self-pay | Admitting: Physical Therapy

## 2015-01-27 ENCOUNTER — Ambulatory Visit: Payer: Medicare Other | Admitting: Physical Therapy

## 2015-01-27 ENCOUNTER — Ambulatory Visit: Payer: Medicare Other | Admitting: Occupational Therapy

## 2015-01-27 DIAGNOSIS — G8114 Spastic hemiplegia affecting left nondominant side: Secondary | ICD-10-CM | POA: Diagnosis not present

## 2015-01-27 DIAGNOSIS — M25512 Pain in left shoulder: Secondary | ICD-10-CM | POA: Diagnosis not present

## 2015-01-27 DIAGNOSIS — IMO0002 Reserved for concepts with insufficient information to code with codable children: Secondary | ICD-10-CM

## 2015-01-27 DIAGNOSIS — R531 Weakness: Secondary | ICD-10-CM | POA: Diagnosis not present

## 2015-01-27 DIAGNOSIS — R269 Unspecified abnormalities of gait and mobility: Secondary | ICD-10-CM | POA: Diagnosis not present

## 2015-01-27 DIAGNOSIS — Z7409 Other reduced mobility: Secondary | ICD-10-CM

## 2015-01-27 DIAGNOSIS — M21372 Foot drop, left foot: Secondary | ICD-10-CM

## 2015-01-27 DIAGNOSIS — I69898 Other sequelae of other cerebrovascular disease: Secondary | ICD-10-CM | POA: Diagnosis not present

## 2015-01-27 NOTE — Therapy (Signed)
[facility] [address] [place], [place], [address] Phone: [phone]   Fax:  [phone]  Physical Therapy Treatment  Patient Details  Name: Mary Le MRN: [id number] Date of Birth: 05-31-1946 Referring Provider:  [name], MD  Encounter Date: 01/27/2015      PT End of Session - 01/27/15 1715    Visit Number 9   Number of Visits 17   Date for PT Re-Evaluation 02/26/15   Authorization Type Medicare - G Codes required every 10 visits   PT Start Time 1536   PT Stop Time 1615   PT Time Calculation (min) 39 min   Equipment Utilized During Treatment Gait belt   Activity Tolerance Patient tolerated treatment well   Behavior During Therapy [facility] for tasks assessed/performed      Past Medical History  Diagnosis Date  . Hypertension   . Hypercholesteremia   . Back pain     arthritis  . CVA (cerebral infarction)   . Fall 11/2014    fell in bathroom, uses a cane  . Joint pain     back, history of   . Weakness     Past Surgical History  Procedure Laterality Date  . Appendectomy    . Tonsillectomy    . Hemorroidectomy    . Tubal ligation    . Radiology with anesthesia N/A 06/03/2014    Procedure: RADIOLOGY WITH ANESTHESIA;  Surgeon: [name], MD;  Location: [facility];  Service: Radiology;  Laterality: N/A;  . Tee without cardioversion N/A 06/07/2014    Procedure: TRANSESOPHAGEAL ECHOCARDIOGRAM (TEE);  Surgeon: [name], MD;  Location: [facility] ENDOSCOPY;  Service: Cardiovascular;  Laterality: N/A;  . Loop recorder implant  06/07/2014    MDT LINQ implanted by Dr [name] for cryptogenic stroke  . Loop recorder implant N/A 06/07/2014    Procedure: LOOP RECORDER IMPLANT;  Surgeon: [name], MD;  Location: [facility] CATH LAB;  Service: Cardiovascular;  Laterality: N/A;    There were no vitals filed for this visit.  Visit Diagnosis:  Abnormality of gait  Spastic hemiplegia affecting left  nondominant side      Subjective Assessment - 01/27/15 1539    Subjective Pt reports no falls, no changes. In reference to getting into/out of car, pt states, "It's getting easier every time." Pt reports having gone out to [employer].   Patient Stated Goals Improve overall mobility, increase independence.   Currently in Pain? No/denies                         [facility] Adult PT Treatment/Exercise - 01/27/15 0001    Ambulation/Gait   Ambulation/Gait Yes   Ambulation/Gait Assistance 6: Modified independent (Device/Increase time);5: Supervision  using L FootUp brace for dorsiflexion assist   Ambulation/Gait Assistance Details Supervision without AD   Ambulation Distance (Feet) 315 Feet   Assistive device 4-wheeled walker;Straight cane;None   Gait Pattern Step-through pattern;Decreased arm swing - left;Decreased weight shift to right;Lateral trunk lean to left;Trunk rotated posteriorly on left   Ambulation Surface Level;Indoor   Gait velocity 1.23 ft/sec with L FootUp brace using SPC  1.49 ft/sec using L FootUp brace using SPC   Curb 4: Min assist   Curb Details (indicate cue type and reason) Blocked practice of curb step negotiation (to promote pt carryover) without SPC. Cueing focused on sequencing. Pt with effective within-session carryover.  PT Education - 01/27/15 1700    Education provided Yes   Education Details Change in gait speed/quality with use of L FootUp brace.   Person(s) Educated Patient;Child(ren)   Methods Explanation   Comprehension Verbalized understanding          PT Short Term Goals - 01/20/15 1407    PT SHORT TERM GOAL #1   Title Pt will perform HEP with mod I using paper handout to facilitate carryover of home exercises. Target date: 01/25/15.   Baseline Met 01/18/15.   Status Achieved   PT SHORT TERM GOAL #2   Title Pt will increase Berg Balance Scale score from 36/56 to 39/56 to progress toward significant improvement  in functional standing balance.  Target date: 01/25/15   Baseline 7/1: 45/56 on Berg   Status Achieved   PT SHORT TERM GOAL #3   Title Pt will improve gait speed from 1.17 ft/sec to 1.47 ft/sec to demonstrate improved efficiency of ambulation.  Target date: 01/25/15.   Baseline 1.64 ft/sec on 01/20/15   Status Achieved           PT Long Term Goals - 01/20/15 1500    PT LONG TERM GOAL #1   Title Pt will improve Berg score from 36/56 to 42/56 to demonstrate significant improvement in functional standing balance. Target: 02/22/15.   Baseline Achieved 01/20/15 with score of 45/56.   Status Achieved   PT LONG TERM GOAL #2   Title Pt will improve gait speed from 1.17 ft/sec to 1.77 ft/sec to indicate decreased falls risk. Target: 02/22/15.   Status On-going   PT LONG TERM GOAL #3   Title Pt will ambulate >300' over unlevel surfaces with LRAD and mod I without overt LOB to indicate safety with community ambulation. Target: 02/22/15.   Status On-going   PT LONG TERM GOAL #4   Title Pt will traverse community surfaces (standard curb step, inclined/declined sidewalk) with mod I using LRAD to indicate increased stability with community mobility. Target: 02/22/15.   Status On-going   PT LONG TERM GOAL #5   Title Pt will transfer into/out of personal car with mod I using LRAD. Target: 02/22/15.   Baseline Distance from ground to floor board: 13"; from ground to seat: 31".   Status On-going               Plan - 01/27/15 1716    Clinical Impression Statement With gait trial using L FootUp brace, pt demonstrated improved L ankle dorsiflexion during LLE advancement, consistent LLE clearance, and increased self-selective gait speed suggestive of increased safety with limited community ambulation. When using Footup brace, pt met STG addressing gait speed. Plan to request MD order for L FootUp brace to incrase safety with functional mobility, ambulation.  Continue per POC.   Pt will benefit from skilled  therapeutic intervention in order to improve on the following deficits Abnormal gait;Decreased coordination;Decreased activity tolerance;Decreased balance;Decreased mobility;Decreased endurance;Decreased strength;Increased edema;Impaired sensation;Impaired flexibility;Impaired perceived functional ability;Impaired UE functional use;Impaired tone;Dizziness;Decreased range of motion;Postural dysfunction   Rehab Potential Good   PT Frequency 2x / week   PT Duration 8 weeks   PT Treatment/Interventions Gait training;Therapeutic exercise;Patient/family education;Balance training;Stair training;Neuromuscular re-education;Therapeutic activities;Functional mobility training;Manual techniques;Vestibular;ADLs/Self Care Home Management;Orthotic Fit/Training   PT Next Visit Plan Check to see if Dr. Letta Pate submitted order for L Foot Up brace and, if so, fax to orthotist. Continue to address L hip strengthening, proximal stability/control, dynamic standing balance.   PT Home Exercise Plan Current HEP:  removed L ankle dorsiflexion and eversion exercises due to pt inability to pt difficulty performing correctly. For increased L heel cord extensibility, consider either 1) caregiver-assisted stretch or 2) standing on incline.Added supine L SLR.   Consulted and Agree with Plan of Care Patient;Family member/caregiver   Family Member Consulted daughter, Delphia Grates        Problem List Patient Active Problem List   Diagnosis Date Noted  . Syncope 12/06/2014  . Spastic hemiplegia affecting nondominant side 07/25/2014  . 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 Left hemiparesis 06/10/2014  . Left-sided neglect 06/10/2014  . 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 Morbid obesity- BMI 40 06/06/2014  . Acute respiratory failure with hypoxia 06/05/2014  . Cerebral infarction due to occlusion of right middle cerebral artery 06/03/2014  . Cerebral  infarct 06/03/2014  . Altered mental status 06/03/2014  . GERD (gastroesophageal reflux disease) 04/12/2014  . Benign essential HTN 02/07/2014  . Dyslipidemia- statin intol 02/07/2014  . Chest pain- Low risk Myoview July 2015 02/06/2014    Billie Ruddy, PT, DPT [facility] [address] [place], [place], [address] Phone: [phone]   Fax:  [phone] 01/27/2015, 5:25 PM

## 2015-01-27 NOTE — Therapy (Signed)
[facility] [address] [place], [place], [address] Phone: [phone]   Fax:  [phone]  Occupational Therapy Treatment  Patient Details  Name: Mary Le MRN: [id number] Date of Birth: 04/01/1946 Referring Provider:  Greene, Jeffrey R, MD  Encounter Date: 01/27/2015      OT End of Session - 01/27/15 1710    Visit Number 8   Number of Visits 17   Date for OT Re-Evaluation 02/25/15   Authorization Type medicare - needs G code!   Authorization - Visit Number 8   Authorization - Number of Visits 10   OT Start Time 1454   OT Stop Time 1533   OT Time Calculation (min) 39 min   Activity Tolerance Patient tolerated treatment well   Behavior During Therapy WFL for tasks assessed/performed      Past Medical History  Diagnosis Date  . Hypertension   . Hypercholesteremia   . Back pain     arthritis  . CVA (cerebral infarction)   . Fall 11/2014    fell in bathroom, uses a cane  . Joint pain     back, history of   . Weakness     Past Surgical History  Procedure Laterality Date  . Appendectomy    . Tonsillectomy    . Hemorroidectomy    . Tubal ligation    . Radiology with anesthesia N/A 06/03/2014    Procedure: RADIOLOGY WITH ANESTHESIA;  Surgeon: Sanjeev K Deveshwar, MD;  Location: [facility] OR;  Service: Radiology;  Laterality: N/A;  . Tee without cardioversion N/A 06/07/2014    Procedure: TRANSESOPHAGEAL ECHOCARDIOGRAM (TEE);  Surgeon: Brian S Crenshaw, MD;  Location: [facility] ENDOSCOPY;  Service: Cardiovascular;  Laterality: N/A;  . Loop recorder implant  06/07/2014    MDT LINQ implanted by Dr Allred for cryptogenic stroke  . Loop recorder implant N/A 06/07/2014    Procedure: LOOP RECORDER IMPLANT;  Surgeon: James D Allred, MD;  Location: [facility] CATH LAB;  Service: Cardiovascular;  Laterality: N/A;    There were no vitals filed for this visit.  Visit Diagnosis:  Weakness due to cerebrovascular accident  Spastic  hemiplegia affecting left nondominant side  Impaired functional mobility and activity tolerance      Subjective Assessment - 01/27/15 1708    Patient is accompained by: Family member   Pertinent History see epic snapshot;  HTN, fluctuating BP since stroke   Patient Stated Goals use left arm, move fingers   Currently in Pain? No/denies   Multiple Pain Sites No       Treatment: Neuro muscular re-ed supine with trunk rotation and weightbearing through LUE, min v.c. Facilitation. Seated stretching reach for the floor and gentle P/ROM stretch to digits in extension. Orthotic fit:Pt was fitted with a custom paddle splint to promote MP extension and improve weightbearing through LUE. Pt and dtr were instructed in wear, care and precautions.  Wearing splint pt performed weightbearing edge of mat and tableslides, min facilitation.                         OT Short Term Goals - 01/20/15 1625    OT SHORT TERM GOAL #1   Title I with updated HEP   Status Achieved   OT SHORT TERM GOAL #2   Title Pt will donn shirt with setup consistently   Status Partially Met   OT SHORT TERM GOAL #3   Title Pt will incorporate LUE into ADLS/ IADLS at   least 10% of the time as a stabilizer/ gross A with pain less than or equal to 3/10   Status Partially Met   OT SHORT TERM GOAL #4   Title Pt will assist with light home management / meal prep at least 3x week.   Status On-going           OT Long Term Goals - 01/11/15 1543    OT LONG TERM GOAL #1   Title Pt will demonstrate ability to stand modified indpendently for light home management/ cooking x 20 mins without LOB or rest break   Time 8   Period Weeks   Status On-going   OT LONG TERM GOAL #2   Title Pt will increase A/ROM shoulder flexion to 60* consistently for increased ease with bathing and dressing.   Time 8   Period Weeks   Status On-going   OT LONG TERM GOAL #3   Title Pt will demonstrate A/ROM elbow extension to -25  for increased ease with ADLs.   Time 8   Period Weeks   Status On-going   OT LONG TERM GOAL #4   Title Pt will donn pants with set up only on a consistent basis.   Time 8   Period Weeks   Status On-going               Plan - 01/27/15 1709    Clinical Impression Statement Pt is progressing towards goals with improving LUE A/ROM.   Pt will benefit from skilled therapeutic intervention in order to improve on the following deficits (Retired) Decreased activity tolerance;Decreased balance;Decreased cognition;Decreased coordination;Decreased range of motion;Decreased safety awareness;Decreased mobility;Decreased knowledge of use of DME;Decreased strength;Increased edema;Impaired UE functional use;Impaired tone;Pain;Impaired sensation;Impaired perceived functional ability;Improper body mechanics;Obesity   Rehab Potential Good   Clinical Impairments Affecting Rehab Potential pain, spasticity, decreased balance, decrease activity tolerance   OT Frequency 2x / week   OT Duration 8 weeks   OT Treatment/Interventions Self-care/ADL training;Ultrasound;Moist Heat;Fluidtherapy;Therapeutic exercise;Neuromuscular education;Energy conservation;Manual Therapy;Functional Mobility Training;DME and/or AE instruction;Passive range of motion;Visual/perceptual remediation/compensation;Cognitive remediation/compensation;Therapeutic activities;Splinting;Patient/family education;Balance training   Plan neuro muscular re-ed   OT Home Exercise Plan supine LUE sidelying, and trunk rotation- issued 10/06/14   Consulted and Agree with Plan of Care Patient   Family Member Consulted daughter        Problem List Patient Active Problem List   Diagnosis Date Noted  . Syncope 12/06/2014  . Spastic hemiplegia affecting nondominant side 07/25/2014  . 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 Left hemiparesis 06/10/2014  . Left-sided neglect 06/10/2014  . 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 Morbid obesity- BMI 40 06/06/2014  . Acute respiratory failure with hypoxia 06/05/2014  . Cerebral infarction due to occlusion of right middle cerebral artery 06/03/2014  . Cerebral infarct 06/03/2014  . Altered mental status 06/03/2014  . GERD (gastroesophageal reflux disease) 04/12/2014  . Benign essential HTN 02/07/2014  . Dyslipidemia- statin intol 02/07/2014  . Chest pain- Low risk Myoview July 2015 02/06/2014    RINE,KATHRYN 01/27/2015, 5:11 PM Theone Murdoch, OTR/L [phone] Phone: [phone] 5:12 PM 01/27/2015 [facility] [address] [place], [place], [address] Phone: [phone]   Fax:  [phone]

## 2015-01-27 NOTE — Telephone Encounter (Signed)
Dr. Letta Pate,  When ambulating with L FootUp brace, Mary Le exhibits improved L foot clearance and increased gait speed. I would recommend a L Foot Up brace, as it would decrease Mary Le's fall risk.   If you agree, please submit an order for a left Foot Up brace.  Thanks so much,  Billie Ruddy, PT, DPT [facility] [address] [place], [place], [address] Phone: [phone]   Fax:  [phone] 01/27/2015, 5:31 PM

## 2015-01-30 NOTE — Addendum Note (Signed)
Addended by: Charlett Blake on: 01/30/2015 08:57 AM   Modules accepted: Orders

## 2015-01-31 ENCOUNTER — Ambulatory Visit: Payer: Medicare Other | Admitting: Physical Therapy

## 2015-01-31 ENCOUNTER — Ambulatory Visit: Payer: Medicare Other | Admitting: Occupational Therapy

## 2015-01-31 DIAGNOSIS — I69898 Other sequelae of other cerebrovascular disease: Secondary | ICD-10-CM | POA: Diagnosis not present

## 2015-01-31 DIAGNOSIS — G8114 Spastic hemiplegia affecting left nondominant side: Secondary | ICD-10-CM

## 2015-01-31 DIAGNOSIS — R269 Unspecified abnormalities of gait and mobility: Secondary | ICD-10-CM

## 2015-01-31 DIAGNOSIS — M25512 Pain in left shoulder: Secondary | ICD-10-CM | POA: Diagnosis not present

## 2015-01-31 DIAGNOSIS — R531 Weakness: Secondary | ICD-10-CM | POA: Diagnosis not present

## 2015-01-31 DIAGNOSIS — Z7409 Other reduced mobility: Secondary | ICD-10-CM | POA: Diagnosis not present

## 2015-01-31 DIAGNOSIS — IMO0002 Reserved for concepts with insufficient information to code with codable children: Secondary | ICD-10-CM

## 2015-01-31 NOTE — Therapy (Signed)
[facility] [address] [place], [place], [address] Phone: [phone]   Fax:  [phone]  Occupational Therapy Treatment  Patient Details  Name: Mary Le MRN: [id number] Date of Birth: 1945-11-26 Referring Provider:  [name], MD  Encounter Date: 01/31/2015      OT End of Session - 01/31/15 1523    Visit Number 9   Number of Visits 17   Date for OT Re-Evaluation 02/25/15   Authorization Type medicare - needs G code!   Authorization Time Period 60 days ( anticipate d/c after 6 weeks depending on progress) end of certification period is 02/25/15   Authorization - Visit Number 9   Authorization - Number of Visits 10   OT Start Time 1450   OT Stop Time 1530   OT Time Calculation (min) 40 min   Activity Tolerance Patient tolerated treatment well   Behavior During Therapy [facility] for tasks assessed/performed      Past Medical History  Diagnosis Date  . Hypertension   . Hypercholesteremia   . Back pain     arthritis  . CVA (cerebral infarction)   . Fall 11/2014    fell in bathroom, uses a cane  . Joint pain     back, history of   . Weakness     Past Surgical History  Procedure Laterality Date  . Appendectomy    . Tonsillectomy    . Hemorroidectomy    . Tubal ligation    . Radiology with anesthesia N/A 06/03/2014    Procedure: RADIOLOGY WITH ANESTHESIA;  Surgeon: [name], MD;  Location: [facility];  Service: Radiology;  Laterality: N/A;  . Tee without cardioversion N/A 06/07/2014    Procedure: TRANSESOPHAGEAL ECHOCARDIOGRAM (TEE);  Surgeon: [name], MD;  Location: [facility] ENDOSCOPY;  Service: Cardiovascular;  Laterality: N/A;  . Loop recorder implant  06/07/2014    MDT LINQ implanted by Dr [name] for cryptogenic stroke  . Loop recorder implant N/A 06/07/2014    Procedure: LOOP RECORDER IMPLANT;  Surgeon: [name], MD;  Location: [facility] CATH LAB;  Service: Cardiovascular;  Laterality:  N/A;    There were no vitals filed for this visit.  Visit Diagnosis:  Weakness due to cerebrovascular accident  Spastic hemiplegia affecting left nondominant side  Impaired functional mobility and activity tolerance      Subjective Assessment - 01/31/15 1551    Pertinent History see epic snapshot;  HTN, fluctuating BP since stroke   Patient Stated Goals use left arm, move fingers   Currently in Pain? No/denies      Treatment: Neuro muscular re-ed in supine for shoulder diagonals/ flexion with trunk rotation followed by AA/ROM shoulder flexion with LUE and triceps extension, bilateral UE shoulder flexion with UE Ranger, min facillitation. Gentle P/ROM stretch of digits in extension, followed by weightbearing through LUE edge of mat while reaching into various planes with RUE for body on arm movements, min-mod facillitation. Pt reports paddle splint is working fine. She forgot today as her  Husband is in the hospital.                          OT Short Term Goals - 01/20/15 1625    OT SHORT TERM GOAL #1   Title I with updated HEP   Status Achieved   OT SHORT TERM GOAL #2   Title Pt will donn shirt with setup consistently   Status Partially Met  OT SHORT TERM GOAL #3   Title Pt will incorporate LUE into ADLS/ IADLS at least 81% of the time as a stabilizer/ gross A with pain less than or equal to 3/10   Status Partially Met   OT SHORT TERM GOAL #4   Title Pt will assist with light home management / meal prep at least 3x week.   Status On-going           OT Long Term Goals - 01/11/15 1543    OT LONG TERM GOAL #1   Title Pt will demonstrate ability to stand modified indpendently for light home management/ cooking x 20 mins without LOB or rest break   Time 8   Period Weeks   Status On-going   OT LONG TERM GOAL #2   Title Pt will increase A/ROM shoulder flexion to 60* consistently for increased ease with bathing and dressing.   Time 8   Period Weeks    Status On-going   OT LONG TERM GOAL #3   Title Pt will demonstrate A/ROM elbow extension to -25 for increased ease with ADLs.   Time 8   Period Weeks   Status On-going   OT LONG TERM GOAL #4   Title Pt will donn pants with set up only on a consistent basis.   Time 8   Period Weeks   Status On-going               Plan - 01/31/15 1543    Clinical Impression Statement Pt is progressing towards goals with improving LUE A/ROM in arm and decreased spasticity.   Pt will benefit from skilled therapeutic intervention in order to improve on the following deficits (Retired) Decreased activity tolerance;Decreased balance;Decreased cognition;Decreased coordination;Decreased range of motion;Decreased safety awareness;Decreased mobility;Decreased knowledge of use of DME;Decreased strength;Increased edema;Impaired UE functional use;Impaired tone;Pain;Impaired sensation;Impaired perceived functional ability;Improper body mechanics;Obesity   Rehab Potential Good   Clinical Impairments Affecting Rehab Potential pain, spasticity, decreased balance, decrease activity tolerance   OT Frequency 2x / week   OT Duration 8 weeks   OT Treatment/Interventions Self-care/ADL training;Ultrasound;Moist Heat;Fluidtherapy;Therapeutic exercise;Neuromuscular education;Energy conservation;Manual Therapy;Functional Mobility Training;DME and/or AE instruction;Passive range of motion;Visual/perceptual remediation/compensation;Cognitive remediation/compensation;Therapeutic activities;Splinting;Patient/family education;Balance training   Plan neuro re-ed   OT Home Exercise Plan supine LUE sidelying, and trunk rotation- issued 10/06/14   Consulted and Agree with Plan of Care Patient        Problem List Patient Active Problem List   Diagnosis Date Noted  . Syncope 12/06/2014  . Spastic hemiplegia affecting nondominant side 07/25/2014  . 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 Left  hemiparesis 06/10/2014  . Left-sided neglect 06/10/2014  . 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 Morbid obesity- BMI 40 06/06/2014  . Acute respiratory failure with hypoxia 06/05/2014  . Cerebral infarction due to occlusion of right middle cerebral artery 06/03/2014  . Cerebral infarct 06/03/2014  . Altered mental status 06/03/2014  . GERD (gastroesophageal reflux disease) 04/12/2014  . Benign essential HTN 02/07/2014  . Dyslipidemia- statin intol 02/07/2014  . Chest pain- Low risk Myoview July 2015 02/06/2014    RINE,KATHRYN 01/31/2015, 3:52 PM Theone Murdoch, OTR/L [phone] Phone: [phone] 3:52 PM 01/31/2015 [facility] [address] [place], [place], [address] Phone: [phone]   Fax:  [phone]

## 2015-01-31 NOTE — Therapy (Signed)
[facility] [address] [place], [place], [address] Phone: [phone]   Fax:  [phone]  Physical Therapy Treatment  Patient Details  Name: Mary Le MRN: [id number] Date of Birth: [birth year] Referring Provider:  [name], MD  Encounter Date: 01/31/2015      PT End of Session - 01/31/15 1640    Visit Number 10   Number of Visits 17   Date for PT Re-Evaluation 02/26/15   Authorization Type Medicare - G Codes required every 10 visits   PT Start Time 1779   PT Stop Time 1445   PT Time Calculation (min) 41 min   Equipment Utilized During Treatment Gait belt   Activity Tolerance Patient tolerated treatment well   Behavior During Therapy [facility] for tasks assessed/performed      Past Medical History  Diagnosis Date  . Hypertension   . Hypercholesteremia   . Back pain     arthritis  . CVA (cerebral infarction)   . Fall 11/2014    fell in bathroom, uses a cane  . Joint pain     back, history of   . Weakness     Past Surgical History  Procedure Laterality Date  . Appendectomy    . Tonsillectomy    . Hemorroidectomy    . Tubal ligation    . Radiology with anesthesia N/A 06/03/2014    Procedure: RADIOLOGY WITH ANESTHESIA;  Surgeon: [name], MD;  Location: [facility];  Service: Radiology;  Laterality: N/A;  . Tee without cardioversion N/A 06/07/2014    Procedure: TRANSESOPHAGEAL ECHOCARDIOGRAM (TEE);  Surgeon: [name], MD;  Location: [facility] ENDOSCOPY;  Service: Cardiovascular;  Laterality: N/A;  . Loop recorder implant  06/07/2014    MDT LINQ implanted by Dr [name] for cryptogenic stroke  . Loop recorder implant N/A 06/07/2014    Procedure: LOOP RECORDER IMPLANT;  Surgeon: [name], MD;  Location: [facility] CATH LAB;  Service: Cardiovascular;  Laterality: N/A;    There were no vitals filed for this visit.  Visit Diagnosis:  Spastic hemiplegia affecting left nondominant side  Abnormality  of gait      Subjective Assessment - 01/31/15 1410    Subjective Pt reporting having walked "a lot" today from hospital room to/from parking lot. Husband currently hospitalized for elevated blood glucose.    Patient Stated Goals Improve overall mobility, increase independence.   Currently in Pain? No/denies          Treatment   Therapeutic Exercises: - B clamshells 2 x15 reps per side. Verbal/tactile cueing focused on technique, pelvic alignment for isolation of gluteus medius with effective within-session carryover. Pt exhibiting increased difficulty on R side as compared with L.  Neuro Re-ed: Attempted tall kneeling for increased proximal stability; however, pt unable to tolerate WB on R knee. Therefore, transitioned to the following activities in L half tall kneeling (standing with L knee on mat table, R foot on floor) requiring min A, R HHA: forward/retro walking with mod A and multimodal cueing for full anterior/lateral weight shift to initiate RLE advancement; R anterior reaching to promote L pelvic protraction, activation of L gluteus medius.                 [facility] Adult PT Treatment/Exercise - 01/31/15 0001    Ambulation/Gait   Ambulation/Gait Yes   Ambulation/Gait Assistance 5: Supervision;4: Min guard;4: Min assist   Ambulation/Gait Assistance Details Supervision for majority of gait trials; min A to recover from  single LOB due to L toe catch when pt turned head   Ambulation Distance (Feet) 280 Feet  2 x115', 1 x50'   Assistive device None   Ambulation Surface Level;Indoor   Posture/Postural Control   Posture/Postural Control Postural limitations   Postural Limitations Rounded Shoulders;Forward head;Weight shift left;Right pelvic obliquity                  PT Short Term Goals - 01/20/15 1407    PT SHORT TERM GOAL #1   Title Pt will perform HEP with mod I using paper handout to facilitate carryover of home exercises. Target date: 01/25/15.   Baseline  Met 01/18/15.   Status Achieved   PT SHORT TERM GOAL #2   Title Pt will increase Berg Balance Scale score from 36/56 to 39/56 to progress toward significant improvement in functional standing balance.  Target date: 01/25/15   Baseline 7/1: 45/56 on Berg   Status Achieved   PT SHORT TERM GOAL #3   Title Pt will improve gait speed from 1.17 ft/sec to 1.47 ft/sec to demonstrate improved efficiency of ambulation.  Target date: 01/25/15.   Baseline 1.64 ft/sec on 01/20/15   Status Achieved           PT Long Term Goals - 01/20/15 1500    PT LONG TERM GOAL #1   Title Pt will improve Berg score from 36/56 to 42/56 to demonstrate significant improvement in functional standing balance. Target: 02/22/15.   Baseline Achieved 01/20/15 with score of 45/56.   Status Achieved   PT LONG TERM GOAL #2   Title Pt will improve gait speed from 1.17 ft/sec to 1.77 ft/sec to indicate decreased falls risk. Target: 02/22/15.   Status On-going   PT LONG TERM GOAL #3   Title Pt will ambulate >300' over unlevel surfaces with LRAD and mod I without overt LOB to indicate safety with community ambulation. Target: 02/22/15.   Status On-going   PT LONG TERM GOAL #4   Title Pt will traverse community surfaces (standard curb step, inclined/declined sidewalk) with mod I using LRAD to indicate increased stability with community mobility. Target: 02/22/15.   Status On-going   PT LONG TERM GOAL #5   Title Pt will transfer into/out of personal car with mod I using LRAD. Target: 02/22/15.   Baseline Distance from ground to floor board: 13"; from ground to seat: 31".   Status On-going               Plan - 01/31/15 1644    Clinical Impression Statement Interventions focused on increased L pelvic protraction to address L Trendelenburg gait pattern to improve stability/independence with, energy efficiency of gait. Continue per POC.   Pt will benefit from skilled therapeutic intervention in order to improve on the following deficits  Abnormal gait;Decreased coordination;Decreased activity tolerance;Decreased balance;Decreased mobility;Decreased endurance;Decreased strength;Increased edema;Impaired sensation;Impaired flexibility;Impaired perceived functional ability;Impaired UE functional use;Impaired tone;Dizziness;Decreased range of motion;Postural dysfunction   Rehab Potential Good   PT Frequency 2x / week   PT Duration 8 weeks   PT Treatment/Interventions Gait training;Therapeutic exercise;Patient/family education;Balance training;Stair training;Neuromuscular re-education;Therapeutic activities;Functional mobility training;Manual techniques;Vestibular;ADLs/Self Care Home Management;Orthotic Fit/Training   PT Next Visit Plan Continue to address L pelvic retrction, hip strengthening, proximal stability/control, dynamic standing balance.   PT Home Exercise Plan Current HEP: removed L ankle dorsiflexion and eversion exercises due to pt inability to pt difficulty performing correctly. For increased L heel cord extensibility, consider either 1) caregiver-assisted stretch or 2) standing on incline.Added  supine L SLR.   Recommended Other Services Faxed MD order to [facility] for L Foot Up brace on February 11, 2023.   Consulted and Agree with Plan of Care Patient   Family Member Consulted --          G-Codes - 2015/02/11 1642    Functional Assessment Tool Used Merrilee Jansky Balance Scale: 45/56   Functional Limitation Mobility: Walking and moving around   Mobility: Walking and Moving Around Current Status [phone]) At least 1 percent but less than 20 percent impaired, limited or restricted   Mobility: Walking and Moving Around Goal Status [phone]) At least 20 percent but less than 40 percent impaired, limited or restricted      Problem List Patient Active Problem List   Diagnosis Date Noted  . Syncope 12/06/2014  . Spastic hemiplegia affecting nondominant side 07/25/2014  . 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  Left hemiparesis 06/10/2014  . Left-sided neglect 06/10/2014  . 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 Morbid obesity- BMI 40 06/06/2014  . Acute respiratory failure with hypoxia 06/05/2014  . Cerebral infarction due to occlusion of right middle cerebral artery 06/03/2014  . Cerebral infarct 06/03/2014  . Altered mental status 06/03/2014  . GERD (gastroesophageal reflux disease) 04/12/2014  . Benign essential HTN 02/07/2014  . Dyslipidemia- statin intol 02/07/2014  . Chest pain- Low risk Myoview July 2015 02/06/2014    Physical Therapy Progress Note  Dates of Reporting Period: 12/28/14 to 02/11/15   Objective Reports of Subjective Statement: See Subjective above.  Objective Measurements: Berg Balance Scale.  Goal Update: See above.  Plan: Continue per POC.  Reason Skilled Services are Required: To increase stability, independence with functional mobility, decrease risk of falling.   Billie Ruddy, PT, DPT [facility] [address] [place], [place], [address] Phone: [phone]   Fax:  [phone] 11-Feb-2015, 4:53 PM

## 2015-02-02 ENCOUNTER — Encounter: Payer: Self-pay | Admitting: Internal Medicine

## 2015-02-02 ENCOUNTER — Ambulatory Visit (INDEPENDENT_AMBULATORY_CARE_PROVIDER_SITE_OTHER): Payer: Medicare Other | Admitting: *Deleted

## 2015-02-02 DIAGNOSIS — I63411 Cerebral infarction due to embolism of right middle cerebral artery: Secondary | ICD-10-CM

## 2015-02-02 NOTE — Progress Notes (Signed)
Loop recorder 

## 2015-02-03 ENCOUNTER — Ambulatory Visit: Payer: Medicare Other | Admitting: Physical Therapy

## 2015-02-03 ENCOUNTER — Ambulatory Visit: Payer: Medicare Other | Admitting: Occupational Therapy

## 2015-02-03 ENCOUNTER — Encounter: Payer: Self-pay | Admitting: Physical Therapy

## 2015-02-03 DIAGNOSIS — Z7409 Other reduced mobility: Secondary | ICD-10-CM | POA: Diagnosis not present

## 2015-02-03 DIAGNOSIS — G8114 Spastic hemiplegia affecting left nondominant side: Secondary | ICD-10-CM

## 2015-02-03 DIAGNOSIS — R531 Weakness: Secondary | ICD-10-CM | POA: Diagnosis not present

## 2015-02-03 DIAGNOSIS — IMO0002 Reserved for concepts with insufficient information to code with codable children: Secondary | ICD-10-CM

## 2015-02-03 DIAGNOSIS — I69898 Other sequelae of other cerebrovascular disease: Secondary | ICD-10-CM | POA: Diagnosis not present

## 2015-02-03 DIAGNOSIS — R4189 Other symptoms and signs involving cognitive functions and awareness: Secondary | ICD-10-CM

## 2015-02-03 DIAGNOSIS — R269 Unspecified abnormalities of gait and mobility: Secondary | ICD-10-CM

## 2015-02-03 DIAGNOSIS — H539 Unspecified visual disturbance: Secondary | ICD-10-CM

## 2015-02-03 DIAGNOSIS — M21372 Foot drop, left foot: Secondary | ICD-10-CM

## 2015-02-03 DIAGNOSIS — M25512 Pain in left shoulder: Secondary | ICD-10-CM | POA: Diagnosis not present

## 2015-02-03 NOTE — Therapy (Signed)
[facility] [address] [place], [place], [address] Phone: [phone]   Fax:  [phone]  Physical Therapy Treatment  Patient Details  Name: Mary Le MRN: [id number] Date of Birth: Apr 21, 1946 Referring Provider:  [name], MD  Encounter Date: 02/03/2015    02/03/15 1404  PT Visits / Re-Eval  Visit Number 11  Number of Visits 17  Date for PT Re-Evaluation 02/26/15  Authorization  Authorization Type Medicare - G Codes required every 10 visits  PT Time Calculation  PT Start Time 1402  PT Stop Time 1445  PT Time Calculation (min) 43 min  PT - End of Session  Equipment Utilized During Treatment Gait belt  Activity Tolerance Patient tolerated treatment well  Behavior During Therapy [facility] for tasks assessed/performed     Past Medical History  Diagnosis Date  . Hypertension   . Hypercholesteremia   . Back pain     arthritis  . CVA (cerebral infarction)   . Fall 11/2014    fell in bathroom, uses a cane  . Joint pain     back, history of   . Weakness     Past Surgical History  Procedure Laterality Date  . Appendectomy    . Tonsillectomy    . Hemorroidectomy    . Tubal ligation    . Radiology with anesthesia N/A 06/03/2014    Procedure: RADIOLOGY WITH ANESTHESIA;  Surgeon: [name], MD;  Location: [place];  Service: Radiology;  Laterality: N/A;  . Tee without cardioversion N/A 06/07/2014    Procedure: TRANSESOPHAGEAL ECHOCARDIOGRAM (TEE);  Surgeon: [name], MD;  Location: [facility] ENDOSCOPY;  Service: Cardiovascular;  Laterality: N/A;  . Loop recorder implant  06/07/2014    MDT LINQ implanted by Dr [name] for cryptogenic stroke  . Loop recorder implant N/A 06/07/2014    Procedure: LOOP RECORDER IMPLANT;  Surgeon: [name], MD;  Location: [place] CATH LAB;  Service: Cardiovascular;  Laterality: N/A;    There were no vitals filed for this visit.  Visit Diagnosis:  Weakness due to  cerebrovascular accident  Left foot drop  Impaired functional mobility and activity tolerance  Abnormality of gait  Spastic hemiplegia affecting left nondominant side    02/03/15 1403  Symptoms/Limitations  Subjective No new compaints. No falls or pain to report. Doing HEP issued at last visit with daughter's help without issues. Going Tuesday to [employer] to get foot up brace.  Pain Assessment  Currently in Pain? No/denies  Pain Score 0    Treatment: Exercises Side lying clam shells 2 sets of 10 reps each leg with 5 sec holds. Cues on correct form/posture (not to roll hips).  Prone glut kick backs, 2 sets of 10 reps each leg with active assist for correct form. Limited motion on left leg noted vs right leg. Increased time/assist needed for pt to get into prone without left UE pain.  Supine with left side on edge of mat: right leg extended out and left leg with knee flexion and hip extension off edge of mat with foot on 6 inch box. Pt pushing down onto box for increased hip extension and glut med activation. 2 sets of 10 reps.  Seated on green air disc Pelvic rocks laterally with cues/emphasis on upright posture, decreased shoulder movements Pelvic rocks forward/backwards with emphasis as above  Seated on blue pball: Bouncing 1 minute x 2 reps Pelvic rocking all directions with emphasis on isolated pelvic movements form shoulders.  PT Short Term Goals - 01/20/15 1407    PT SHORT TERM GOAL #1   Title Pt will perform HEP with mod I using paper handout to facilitate carryover of home exercises. Target date: 01/25/15.   Baseline Met 01/18/15.   Status Achieved   PT SHORT TERM GOAL #2   Title Pt will increase Berg Balance Scale score from 36/56 to 39/56 to progress toward significant improvement in functional standing balance.  Target date: 01/25/15   Baseline 7/1: 45/56 on Berg   Status Achieved   PT SHORT TERM GOAL #3   Title Pt will improve gait speed from 1.17  ft/sec to 1.47 ft/sec to demonstrate improved efficiency of ambulation.  Target date: 01/25/15.   Baseline 1.64 ft/sec on 01/20/15   Status Achieved           PT Long Term Goals - 01/20/15 1500    PT LONG TERM GOAL #1   Title Pt will improve Berg score from 36/56 to 42/56 to demonstrate significant improvement in functional standing balance. Target: 02/22/15.   Baseline Achieved 01/20/15 with score of 45/56.   Status Achieved   PT LONG TERM GOAL #2   Title Pt will improve gait speed from 1.17 ft/sec to 1.77 ft/sec to indicate decreased falls risk. Target: 02/22/15.   Status On-going   PT LONG TERM GOAL #3   Title Pt will ambulate >300' over unlevel surfaces with LRAD and mod I without overt LOB to indicate safety with community ambulation. Target: 02/22/15.   Status On-going   PT LONG TERM GOAL #4   Title Pt will traverse community surfaces (standard curb step, inclined/declined sidewalk) with mod I using LRAD to indicate increased stability with community mobility. Target: 02/22/15.   Status On-going   PT LONG TERM GOAL #5   Title Pt will transfer into/out of personal car with mod I using LRAD. Target: 02/22/15.   Baseline Distance from ground to floor board: 13"; from ground to seat: 31".   Status On-going        02/03/15 1405  Plan  Clinical Impression Statement Focued on core/hip/leg strengthening and pelvic mobility without any issues reported. Pt making steady progress toward goals.  Pt will benefit from skilled therapeutic intervention in order to improve on the following deficits Abnormal gait;Decreased coordination;Decreased activity tolerance;Decreased balance;Decreased mobility;Decreased endurance;Decreased strength;Increased edema;Impaired sensation;Impaired flexibility;Impaired perceived functional ability;Impaired UE functional use;Impaired tone;Dizziness;Decreased range of motion;Postural dysfunction  Rehab Potential Good  PT Frequency 2x / week  PT Duration 8 weeks  PT  Treatment/Interventions Gait training;Therapeutic exercise;Patient/family education;Balance training;Stair training;Neuromuscular re-education;Therapeutic activities;Functional mobility training;Manual techniques;Vestibular;ADLs/Self Care Home Management;Orthotic Fit/Training  PT Next Visit Plan Continue to address L pelvic retrction, hip strengthening, proximal stability/control, dynamic standing balance.  Consulted and Agree with Plan of Care Patient      Problem List Patient Active Problem List   Diagnosis Date Noted  . Syncope 12/06/2014  . Spastic hemiplegia affecting nondominant side 07/25/2014  . 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 Left hemiparesis 06/10/2014  . Left-sided neglect 06/10/2014  . 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 Morbid obesity- BMI 40 06/06/2014  . Acute respiratory failure with hypoxia 06/05/2014  . Cerebral infarction due to occlusion of right middle cerebral artery 06/03/2014  . Cerebral infarct 06/03/2014  . Altered mental status 06/03/2014  . GERD (gastroesophageal reflux disease) 04/12/2014  . Benign essential HTN 02/07/2014  . Dyslipidemia- statin intol 02/07/2014  . Chest pain- Low risk Myoview  July 2015 02/06/2014    Willow Ora 02/07/2015, 9:40 AM  Willow Ora, PTA, [facility] [address], [place], [place] [address] [phone] 02/07/2015, 9:40 AM

## 2015-02-03 NOTE — Therapy (Signed)
[facility] [address] [place], [place], [address] Phone: [phone]   Fax:  [phone]  Occupational Therapy Treatment  Patient Details  Name: Mary Le MRN: [id number] Date of Birth: June 03, 1946 Referring Provider:  [name], MD  Encounter Date: 02/03/2015      OT End of Session - 02/03/15 1551    Visit Number 10   Number of Visits 17   Date for OT Re-Evaluation 02/25/15   Authorization Type medicare - needs G code!   Authorization Time Period 60 days - end of certification period is 02/25/15   Authorization - Visit Number 10   Authorization - Number of Visits 10   OT Start Time 5102   OT Stop Time 1528   OT Time Calculation (min) 41 min   Activity Tolerance Patient tolerated treatment well   Behavior During Therapy [facility] for tasks assessed/performed      Past Medical History  Diagnosis Date  . Hypertension   . Hypercholesteremia   . Back pain     arthritis  . CVA (cerebral infarction)   . Fall 11/2014    fell in bathroom, uses a cane  . Joint pain     back, history of   . Weakness     Past Surgical History  Procedure Laterality Date  . Appendectomy    . Tonsillectomy    . Hemorroidectomy    . Tubal ligation    . Radiology with anesthesia N/A 06/03/2014    Procedure: RADIOLOGY WITH ANESTHESIA;  Surgeon: [name], MD;  Location: [facility];  Service: Radiology;  Laterality: N/A;  . Tee without cardioversion N/A 06/07/2014    Procedure: TRANSESOPHAGEAL ECHOCARDIOGRAM (TEE);  Surgeon: [name], MD;  Location: [facility] ENDOSCOPY;  Service: Cardiovascular;  Laterality: N/A;  . Loop recorder implant  06/07/2014    MDT LINQ implanted by Dr [name] for cryptogenic stroke  . Loop recorder implant N/A 06/07/2014    Procedure: LOOP RECORDER IMPLANT;  Surgeon: [name], MD;  Location: [facility] CATH LAB;  Service: Cardiovascular;  Laterality: N/A;    There were no vitals filed for this  visit.  Visit Diagnosis:  Weakness due to cerebrovascular accident  Impaired cognition  Impaired visual perception  Pain in joint, shoulder region, left      Subjective Assessment - 02/03/15 1559    Pertinent History see epic snapshot;  HTN, fluctuating BP since stroke   Patient Stated Goals use left arm, move fingers   Currently in Pain? Yes   Pain Score 3    Pain Location Arm   Pain Descriptors / Indicators Aching   Pain Type Acute pain   Pain Onset More than a month ago   Aggravating Factors  malpositioning   Pain Relieving Factors repositioning   Multiple Pain Sites No           Treatment: supine, LUE in abduction with pt, performing trunk rotation and reaching across body for gentle elbow extension, passive stretch and weight bearing through LUE. Weightbearing through left elbow followed by weightbearing edge of mat, then in standing with paddle splint on left hand. (Therapist added foam padding to splint and stretched digits in passive extension prior to application. )shoulder flexion and elbow extension pushing through therapists hand with LUE, while wearing splint, min-mod facilitation. Pt reported significant fatigue at end of session.                   OT Education - 02/03/15  41    Education provided Yes   Education Details Therapist practiced with pt use of paddle splint for gentle weightbearing through LUE seated, standing   Person(s) Educated Patient   Methods Explanation;Demonstration;Verbal cues   Comprehension Verbalized understanding;Returned demonstration          OT Short Term Goals - 2015-02-18 1555    OT SHORT TERM GOAL #1   Title I with updated HEP   Status Achieved   OT SHORT TERM GOAL #2   Title Pt will donn shirt with setup consistently   Status Partially Met   OT SHORT TERM GOAL #3   Title Pt will incorporate LUE into ADLS/ IADLS at least 54% of the time as a stabilizer/ gross A with pain less than or equal to 3/10    Status Partially Met   OT SHORT TERM GOAL #4   Title Pt will assist with light home management / meal prep at least 3x week.   Status On-going           OT Long Term Goals - 02-18-15 1556    OT LONG TERM GOAL #1   Title Pt will demonstrate ability to stand modified indpendently for light home management/ cooking x 20 mins without LOB or rest break   Time 8   Period Weeks   Status On-going   OT LONG TERM GOAL #2   Title Pt will increase A/ROM shoulder flexion to 60* consistently for increased ease with bathing and dressing.   Baseline improved yet not fully achieved   Time 8   Period Weeks   Status On-going   OT LONG TERM GOAL #3   Title Pt will demonstrate A/ROM elbow extension to -25 for increased ease with ADLs.   Baseline Pt demonstrates progress with increased A/ROM elbow extension   Time 8   Period Weeks   Status On-going   OT LONG TERM GOAL #4   Title Pt will donn pants with set up only on a consistent basis.   Time 8   Period Weeks   Status On-going               Plan - 02-18-2015 1547    Clinical Impression Statement Pt is progressing towards goals with improving A/ROM in LUE and improving passive extension in fingers.   Pt will benefit from skilled therapeutic intervention in order to improve on the following deficits (Retired) Decreased activity tolerance;Decreased balance;Decreased cognition;Decreased coordination;Decreased range of motion;Decreased safety awareness;Decreased mobility;Decreased knowledge of use of DME;Decreased strength;Increased edema;Impaired UE functional use;Impaired tone;Pain;Impaired sensation;Impaired perceived functional ability;Improper body mechanics;Obesity   Rehab Potential Good   Clinical Impairments Affecting Rehab Potential pain, spasticity, decreased balance, decrease activity tolerance   OT Frequency 2x / week   OT Duration 8 weeks   OT Treatment/Interventions Self-care/ADL training;Ultrasound;Moist  Heat;Fluidtherapy;Therapeutic exercise;Neuromuscular education;Energy conservation;Manual Therapy;Functional Mobility Training;DME and/or AE instruction;Passive range of motion;Visual/perceptual remediation/compensation;Cognitive remediation/compensation;Therapeutic activities;Splinting;Patient/family education;Balance training   Plan kitchen task incorporate LUE   OT Home Exercise Plan supine LUE sidelying, and trunk rotation- issued 10/06/14, paddle splint use   Consulted and Agree with Plan of Care Patient          G-Codes - 02/18/15 1553    Functional Assessment Tool Used Pt is incorporating LUE into functional activities more, she demonstrates improved A/ROM shoulder flexion and elbow extension, 1/4 STG's met, and another 2 goals partially met   Functional Limitation Self care   Self Care Current Status (U9811) At least 40 percent but less than  60 percent impaired, limited or restricted   Self Care Goal Status (P5361) At least 20 percent but less than 40 percent impaired, limited or restricted      Problem List Patient Active Problem List   Diagnosis Date Noted  . Syncope 12/06/2014  . Spastic hemiplegia affecting nondominant side 07/25/2014  . 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 Left hemiparesis 06/10/2014  . Left-sided neglect 06/10/2014  . 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 Morbid obesity- BMI 40 06/06/2014  . Acute respiratory failure with hypoxia 06/05/2014  . Cerebral infarction due to occlusion of right middle cerebral artery 06/03/2014  . Cerebral infarct 06/03/2014  . Altered mental status 06/03/2014  . GERD (gastroesophageal reflux disease) 04/12/2014  . Benign essential HTN 02/07/2014  . Dyslipidemia- statin intol 02/07/2014  . Chest pain- Low risk Myoview July 2015 02/06/2014  Occupational Therapy Progress Note  Dates of Reporting Period: 12/27/14 to 02/25/15  Objective Reports of  Subjective Statement: Pt is progressing towards goals, with decreased overall LUE spasticity, pain and increased A/ROM in LUE.  Objective Measurements: Pt demonstrates increased A/ROM shoulder flexion, and pt demonstrates improved elbow A/ROM extension to grossly -35 (or better). Pt demonstrates decreased spasticity in MP joints after wearing paddle splint.  Goal Update: Pt has met 1/4 short term goals, and partially met an additional 2.  Plan: continue skilled occupational therapy to address remaining goals.  Reason Skilled Services are Required: Pt requires skilled OT to maximize functional use of LUE and independence with ADLs/ IADLs.  RINE,KATHRYN 02/03/2015, 4:14 PM Theone Murdoch, OTR/L [phone] Phone: [phone] 4:14 PM 02/03/2015 [facility] [address] [place], [place], [address] Phone: [phone]   Fax:  [phone]

## 2015-02-06 ENCOUNTER — Ambulatory Visit: Payer: Medicare Other | Admitting: Physical Medicine & Rehabilitation

## 2015-02-06 ENCOUNTER — Encounter: Payer: Medicare Other | Attending: Physical Medicine & Rehabilitation

## 2015-02-06 DIAGNOSIS — I63311 Cerebral infarction due to thrombosis of right middle cerebral artery: Secondary | ICD-10-CM | POA: Insufficient documentation

## 2015-02-06 DIAGNOSIS — M7502 Adhesive capsulitis of left shoulder: Secondary | ICD-10-CM | POA: Insufficient documentation

## 2015-02-06 DIAGNOSIS — R414 Neurologic neglect syndrome: Secondary | ICD-10-CM | POA: Insufficient documentation

## 2015-02-06 DIAGNOSIS — G8110 Spastic hemiplegia affecting unspecified side: Secondary | ICD-10-CM | POA: Insufficient documentation

## 2015-02-07 ENCOUNTER — Ambulatory Visit: Payer: Medicare Other | Admitting: Physical Therapy

## 2015-02-07 ENCOUNTER — Ambulatory Visit: Payer: Medicare Other | Admitting: Occupational Therapy

## 2015-02-07 ENCOUNTER — Encounter: Payer: Self-pay | Admitting: Physical Therapy

## 2015-02-07 DIAGNOSIS — M25512 Pain in left shoulder: Secondary | ICD-10-CM | POA: Diagnosis not present

## 2015-02-07 DIAGNOSIS — R531 Weakness: Secondary | ICD-10-CM | POA: Diagnosis not present

## 2015-02-07 DIAGNOSIS — G8114 Spastic hemiplegia affecting left nondominant side: Secondary | ICD-10-CM

## 2015-02-07 DIAGNOSIS — I69898 Other sequelae of other cerebrovascular disease: Secondary | ICD-10-CM | POA: Diagnosis not present

## 2015-02-07 DIAGNOSIS — M21372 Foot drop, left foot: Secondary | ICD-10-CM

## 2015-02-07 DIAGNOSIS — R4189 Other symptoms and signs involving cognitive functions and awareness: Secondary | ICD-10-CM

## 2015-02-07 DIAGNOSIS — H539 Unspecified visual disturbance: Secondary | ICD-10-CM

## 2015-02-07 DIAGNOSIS — R269 Unspecified abnormalities of gait and mobility: Secondary | ICD-10-CM | POA: Diagnosis not present

## 2015-02-07 DIAGNOSIS — Z7409 Other reduced mobility: Secondary | ICD-10-CM

## 2015-02-07 DIAGNOSIS — IMO0002 Reserved for concepts with insufficient information to code with codable children: Secondary | ICD-10-CM

## 2015-02-07 NOTE — Therapy (Signed)
[facility] [address] [place], [place], [address] Phone: [phone]   Fax:  [phone]  Occupational Therapy Treatment  Patient Details  Name: Mary Le MRN: [id number] Date of Birth: 08-Feb-1946 Referring Provider:  [name], MD  Encounter Date: 02/07/2015      OT End of Session - 02/07/15 1700    Visit Number 11   Number of Visits 17   Date for OT Re-Evaluation 02/25/15   Authorization Type medicare - needs G code!   Authorization Time Period 60 days - end of certification period is 02/25/15   Authorization - Visit Number 11   Authorization - Number of Visits 20   OT Start Time 3704   OT Stop Time 1615   OT Time Calculation (min) 40 min   Activity Tolerance Patient tolerated treatment well   Behavior During Therapy [facility] for tasks assessed/performed      Past Medical History  Diagnosis Date  . Hypertension   . Hypercholesteremia   . Back pain     arthritis  . CVA (cerebral infarction)   . Fall 11/2014    fell in bathroom, uses a cane  . Joint pain     back, history of   . Weakness     Past Surgical History  Procedure Laterality Date  . Appendectomy    . Tonsillectomy    . Hemorroidectomy    . Tubal ligation    . Radiology with anesthesia N/A 06/03/2014    Procedure: RADIOLOGY WITH ANESTHESIA;  Surgeon: [name], MD;  Location: [facility];  Service: Radiology;  Laterality: N/A;  . Tee without cardioversion N/A 06/07/2014    Procedure: TRANSESOPHAGEAL ECHOCARDIOGRAM (TEE);  Surgeon: [name], MD;  Location: [facility] ENDOSCOPY;  Service: Cardiovascular;  Laterality: N/A;  . Loop recorder implant  06/07/2014    MDT LINQ implanted by Dr [name] for cryptogenic stroke  . Loop recorder implant N/A 06/07/2014    Procedure: LOOP RECORDER IMPLANT;  Surgeon: [name], MD;  Location: [facility] CATH LAB;  Service: Cardiovascular;  Laterality: N/A;    There were no vitals filed for this  visit.  Visit Diagnosis:  Weakness due to cerebrovascular accident  Spastic hemiplegia affecting left nondominant side  Pain in joint, shoulder region, left  Impaired cognition  Impaired visual perception      Subjective Assessment - 02/07/15 1538    Subjective  Pt reports wearing her resting hand splint   Patient is accompained by: Family member   Pertinent History see epic snapshot;  HTN, fluctuating BP since stroke   Patient Stated Goals use left arm, move fingers   Currently in Pain? Yes   Pain Score 2    Pain Location Arm   Pain Descriptors / Indicators Aching   Pain Type Neuropathic pain   Pain Frequency Intermittent   Aggravating Factors  malposistioning   Pain Relieving Factors reposistioning   Effect of Pain on Daily Activities limits functional use   Multiple Pain Sites No      Treatment:Supine on mat neuromuscular re-ed for trunk rotation, with gentle weightbearing through LUE, and passive stretch to digits and elbow. AA/ROM shoulder flexion in supine, followed by weightbearing edge of mat and then through tilted stool, min facilitation, transitioning to pt performing by herself.  Donning doffing shirt x 2 using hemi- techniniques, min A and mod v.c. First trial, supervision and min-mod v.c. second trial. Pt encouraged to perform at home. Pt's daughter present.  OT Education - 02/07/15 1655    Education provided Yes   Education Details donning shirt with hemi techniques, HEP for shoulder flexion/ elbow extension with tilted stool   Person(s) Educated Patient;Child(ren)   Methods Explanation;Demonstration;Verbal cues;Handout   Comprehension Verbalized understanding;Returned demonstration;Verbal cues required          OT Short Term Goals - 02/07/15 1559    OT SHORT TERM GOAL #1   Title I with updated HEP   Status Achieved   OT SHORT TERM GOAL #2   Title Pt will donn shirt with setup consistently   Baseline min  A-min v.c.   Status On-going   OT SHORT TERM GOAL #3   Title Pt will incorporate LUE into ADLS/ IADLS at least 88% of the time as a stabilizer/ gross A with pain less than or equal to 3/10   Status Partially Met   OT SHORT TERM GOAL #4   Title Pt will assist with light home management / meal prep at least 3x week.   Status On-going           OT Long Term Goals - 02/07/15 1621    OT LONG TERM GOAL #1   Title Pt will demonstrate ability to stand modified indpendently for light home management/ cooking x 20 mins without LOB or rest break   Time 8   Period Weeks   Status On-going   OT LONG TERM GOAL #2   Title Pt will increase A/ROM shoulder flexion to 60* consistently for increased ease with bathing and dressing.   Baseline met 65* with min-mod compensation   Time 8   Period Weeks   Status Achieved   OT LONG TERM GOAL #3   Title Pt will demonstrate A/ROM elbow extension to -25 for increased ease with ADLs.   Baseline Pt demonstrates progress with increased A/ROM elbow extension -20   Time 8   Period Weeks   Status Achieved   OT LONG TERM GOAL #4   Title Pt will donn pants with set up only on a consistent basis.   Time 8   Period Weeks   Status On-going               Plan - 02/07/15 1657    Clinical Impression Statement pt is progressing towards goals. she demonstrates improved A/ROM elbow extension and shoulder flexion following stretching/ neuro re-ed. Pt achieved LTG's 2, 3.   Pt will benefit from skilled therapeutic intervention in order to improve on the following deficits (Retired) Decreased activity tolerance;Decreased balance;Decreased cognition;Decreased coordination;Decreased range of motion;Decreased safety awareness;Decreased mobility;Decreased knowledge of use of DME;Decreased strength;Increased edema;Impaired UE functional use;Impaired tone;Pain;Impaired sensation;Impaired perceived functional ability;Improper body mechanics;Obesity   Rehab Potential Good    Clinical Impairments Affecting Rehab Potential pain, spasticity, decreased balance, decrease activity tolerance   OT Frequency 2x / week   OT Duration 8 weeks   OT Treatment/Interventions Self-care/ADL training;Ultrasound;Moist Heat;Fluidtherapy;Therapeutic exercise;Neuromuscular education;Energy conservation;Manual Therapy;Functional Mobility Training;DME and/or AE instruction;Passive range of motion;Visual/perceptual remediation/compensation;Cognitive remediation/compensation;Therapeutic activities;Splinting;Patient/family education;Balance training   Plan kitchen task incorporate LUE   OT Home Exercise Plan supine LUE sidelying, and trunk rotation- issued 10/06/14, paddle splint use, weightbearing through tilted stool with LUE-02/07/15   Consulted and Agree with Plan of Care Patient   Family Member Consulted daughter        Problem List Patient Active Problem List   Diagnosis Date Noted  . Syncope 12/06/2014  . Spastic hemiplegia affecting nondominant side 07/25/2014  . Spondylosis of lumbar region  without myelopathy or radiculopathy 06/20/2014  . Left hemiparesis 06/10/2014  . Left-sided neglect 06/10/2014  . 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 Morbid obesity- BMI 40 06/06/2014  . Acute respiratory failure with hypoxia 06/05/2014  . Cerebral infarction due to occlusion of right middle cerebral artery 06/03/2014  . Cerebral infarct 06/03/2014  . Altered mental status 06/03/2014  . GERD (gastroesophageal reflux disease) 04/12/2014  . Benign essential HTN 02/07/2014  . Dyslipidemia- statin intol 02/07/2014  . Chest pain- Low risk Myoview July 2015 02/06/2014    RINE,KATHRYN 02/07/2015, 5:03 PM Theone Murdoch, OTR/L [phone] Phone: [phone] 5:04 PM 02/07/2015 [facility] [address] [place], [place], [address] Phone: [phone]   Fax:  [phone]

## 2015-02-07 NOTE — Therapy (Signed)
[facility] [address] [place], [place], [address] Phone: [phone]   Fax:  [phone]  Physical Therapy Treatment  Patient Details  Name: Mary Le MRN: [id number] Date of Birth: 03/12/1946 Referring Provider:  [name], MD  Encounter Date: 02/07/2015      PT End of Session - 02/07/15 1452    Visit Number 12   Number of Visits 17   Date for PT Re-Evaluation 02/26/15   Authorization Type Medicare - G Codes required every 10 visits   PT Start Time 1448   PT Stop Time 1530   PT Time Calculation (min) 42 min   Equipment Utilized During Treatment Gait belt   Activity Tolerance Patient tolerated treatment well   Behavior During Therapy [facility] for tasks assessed/performed      Past Medical History  Diagnosis Date  . Hypertension   . Hypercholesteremia   . Back pain     arthritis  . CVA (cerebral infarction)   . Fall 11/2014    fell in bathroom, uses a cane  . Joint pain     back, history of   . Weakness     Past Surgical History  Procedure Laterality Date  . Appendectomy    . Tonsillectomy    . Hemorroidectomy    . Tubal ligation    . Radiology with anesthesia N/A 06/03/2014    Procedure: RADIOLOGY WITH ANESTHESIA;  Surgeon: [name], MD;  Location: [place];  Service: Radiology;  Laterality: N/A;  . Tee without cardioversion N/A 06/07/2014    Procedure: TRANSESOPHAGEAL ECHOCARDIOGRAM (TEE);  Surgeon: [name], MD;  Location: [facility] ENDOSCOPY;  Service: Cardiovascular;  Laterality: N/A;  . Loop recorder implant  06/07/2014    MDT LINQ implanted by Dr [name] for cryptogenic stroke  . Loop recorder implant N/A 06/07/2014    Procedure: LOOP RECORDER IMPLANT;  Surgeon: [name], MD;  Location: [place] CATH LAB;  Service: Cardiovascular;  Laterality: N/A;    There were no vitals filed for this visit.  Visit Diagnosis:  Weakness due to cerebrovascular accident  Left foot  drop  Impaired functional mobility and activity tolerance  Abnormality of gait  Spastic hemiplegia affecting left nondominant side      Subjective Assessment - 02/07/15 1451    Subjective No new complaints or falls to report. Denies pain. Went to [employer] today to get fitted for foot up brace.   Patient Stated Goals Improve overall mobility, increase independence.   Currently in Pain? No/denies      Treatment: Therapeutic Exercise (verbal/tactile cues for technique and ex form): -Bridging with 5 sec hold 2 x 10 -L SLR 2 x 10 -Hook lying hip adduction with ball squeeze 2 x 30 sec -Alt standing hip extension with forearms supported on mat table 2 x 10 -Seated on green balance disc (supervision for safety): anterior/posterior and lateral weight shifting 2 x 10 each (significant difficulty with isolating pelvic motions); alt knee lifts 2 x 10 -Attempted alt hip hiking in standing for pelvic mobility, but pt unable to perform correctly despite several cues/demonstration          PT Short Term Goals - 01/20/15 1407    PT SHORT TERM GOAL #1   Title Pt will perform HEP with mod I using paper handout to facilitate carryover of home exercises. Target date: 01/25/15.   Baseline Met 01/18/15.   Status Achieved   PT SHORT TERM GOAL #2   Title Pt will  increase Berg Balance Scale score from 36/56 to 39/56 to progress toward significant improvement in functional standing balance.  Target date: 01/25/15   Baseline 7/1: 45/56 on Berg   Status Achieved   PT SHORT TERM GOAL #3   Title Pt will improve gait speed from 1.17 ft/sec to 1.47 ft/sec to demonstrate improved efficiency of ambulation.  Target date: 01/25/15.   Baseline 1.64 ft/sec on 01/20/15   Status Achieved           PT Long Term Goals - 01/20/15 1500    PT LONG TERM GOAL #1   Title Pt will improve Berg score from 36/56 to 42/56 to demonstrate significant improvement in functional standing balance. Target: 02/22/15.   Baseline Achieved  01/20/15 with score of 45/56.   Status Achieved   PT LONG TERM GOAL #2   Title Pt will improve gait speed from 1.17 ft/sec to 1.77 ft/sec to indicate decreased falls risk. Target: 02/22/15.   Status On-going   PT LONG TERM GOAL #3   Title Pt will ambulate >300' over unlevel surfaces with LRAD and mod I without overt LOB to indicate safety with community ambulation. Target: 02/22/15.   Status On-going   PT LONG TERM GOAL #4   Title Pt will traverse community surfaces (standard curb step, inclined/declined sidewalk) with mod I using LRAD to indicate increased stability with community mobility. Target: 02/22/15.   Status On-going   PT LONG TERM GOAL #5   Title Pt will transfer into/out of personal car with mod I using LRAD. Target: 02/22/15.   Baseline Distance from ground to floor board: 13"; from ground to seat: 31".   Status On-going           Plan - 02/07/15 1540    Clinical Impression Statement Focused on hip strengthening, core stability, and pelvic mobility exercises. Pt reported increased L hip discomfort after hip strengthening exercises. Continues to demonstrate some difficulty with pelvic mobility. Making steady progress toward goals.   Pt will benefit from skilled therapeutic intervention in order to improve on the following deficits Abnormal gait;Decreased coordination;Decreased activity tolerance;Decreased balance;Decreased mobility;Decreased endurance;Decreased strength;Increased edema;Impaired sensation;Impaired flexibility;Impaired perceived functional ability;Impaired UE functional use;Impaired tone;Dizziness;Decreased range of motion;Postural dysfunction   Rehab Potential Good   PT Frequency 2x / week   PT Duration 8 weeks   PT Treatment/Interventions Gait training;Therapeutic exercise;Patient/family education;Balance training;Stair training;Neuromuscular re-education;Therapeutic activities;Functional mobility training;Manual techniques;Vestibular;ADLs/Self Care Home  Management;Orthotic Fit/Training   PT Next Visit Plan Continue to address L pelvic retrction, hip strengthening, proximal stability/control, dynamic standing balance.   Consulted and Agree with Plan of Care Patient        Problem List Patient Active Problem List   Diagnosis Date Noted  . Syncope 12/06/2014  . Spastic hemiplegia affecting nondominant side 07/25/2014  . 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 Left hemiparesis 06/10/2014  . Left-sided neglect 06/10/2014  . 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 Morbid obesity- BMI 40 06/06/2014  . Acute respiratory failure with hypoxia 06/05/2014  . Cerebral infarction due to occlusion of right middle cerebral artery 06/03/2014  . Cerebral infarct 06/03/2014  . Altered mental status 06/03/2014  . GERD (gastroesophageal reflux disease) 04/12/2014  . Benign essential HTN 02/07/2014  . Dyslipidemia- statin intol 02/07/2014  . Chest pain- Low risk Myoview July 2015 02/06/2014    Rubye Oaks 02/07/2015, 3:50 PM  Rubye Oaks, [facility] [address] [place],  [place], [address] Phone: [phone]   Fax:  [phone]

## 2015-02-07 NOTE — Patient Instructions (Signed)
Place stool in a tilted position while seated, wear paddle splint, push stool forwards and backwards, straightening your elbow, 2 sets of 10 reps 1-2 x day.  Try to put your shirt on by yourself!!! :)

## 2015-02-08 ENCOUNTER — Telehealth: Payer: Self-pay | Admitting: Physical Medicine & Rehabilitation

## 2015-02-10 ENCOUNTER — Ambulatory Visit: Payer: Medicare Other | Admitting: Occupational Therapy

## 2015-02-10 ENCOUNTER — Ambulatory Visit: Payer: Medicare Other | Admitting: Physical Therapy

## 2015-02-10 ENCOUNTER — Encounter: Payer: Self-pay | Admitting: Physical Therapy

## 2015-02-10 DIAGNOSIS — R269 Unspecified abnormalities of gait and mobility: Secondary | ICD-10-CM

## 2015-02-10 DIAGNOSIS — M21372 Foot drop, left foot: Secondary | ICD-10-CM

## 2015-02-10 DIAGNOSIS — IMO0002 Reserved for concepts with insufficient information to code with codable children: Secondary | ICD-10-CM

## 2015-02-10 LAB — CUP PACEART REMOTE DEVICE CHECK: MDC IDC SESS DTM: 20160722135815

## 2015-02-10 NOTE — Therapy (Signed)
[facility] [address] [place], [place], [address] Phone: [phone]   Fax:  [phone]  Physical Therapy Treatment  Patient Details  Name: Mary Le MRN: [id number] Date of Birth: Jan 18, 1946 Referring Provider:  [name], MD  Encounter Date: 02/10/2015      PT End of Session - 02/10/15 1700    Visit Number 12   Number of Visits 17   Date for PT Re-Evaluation 02/26/15   Authorization Type Medicare - G Codes required every 10 visits   PT Start Time 1330   PT Stop Time 1337   PT Time Calculation (min) 7 min   Behavior During Therapy [facility] for tasks assessed/performed      Past Medical History  Diagnosis Date  . Hypertension   . Hypercholesteremia   . Back pain     arthritis  . CVA (cerebral infarction)   . Fall 11/2014    fell in bathroom, uses a cane  . Joint pain     back, history of   . Weakness     Past Surgical History  Procedure Laterality Date  . Appendectomy    . Tonsillectomy    . Hemorroidectomy    . Tubal ligation    . Radiology with anesthesia N/A 06/03/2014    Procedure: RADIOLOGY WITH ANESTHESIA;  Surgeon: [name], MD;  Location: [place];  Service: Radiology;  Laterality: N/A;  . Tee without cardioversion N/A 06/07/2014    Procedure: TRANSESOPHAGEAL ECHOCARDIOGRAM (TEE);  Surgeon: [name], MD;  Location: [facility] ENDOSCOPY;  Service: Cardiovascular;  Laterality: N/A;  . Loop recorder implant  06/07/2014    MDT LINQ implanted by Dr [name] for cryptogenic stroke  . Loop recorder implant N/A 06/07/2014    Procedure: LOOP RECORDER IMPLANT;  Surgeon: [name], MD;  Location: [place] CATH LAB;  Service: Cardiovascular;  Laterality: N/A;    There were no vitals filed for this visit.  Visit Diagnosis:  Abnormality of gait  Left foot drop      Subjective Assessment - 02/10/15 1411    Subjective Pt not feeling good and wishes to forgo PT session today; however, pt  requesting assistance with placing new L FootUp brace on shoe. Both pt and daughter express understanding of PT recommendation for discharge at this time due to all goals met. Pt replied, "Yeah, I've been thinking about that myself. I've gotten a lot better."   Patient is accompained by: Family member  daughter, [name]   Patient Stated Goals Improve overall mobility, increase independence.   Currently in Pain? No/denies                         [facility] Adult PT Treatment/Exercise - 02/10/15 0001    Ambulation/Gait   Ambulation/Gait Yes   Ambulation/Gait Assistance 6: Modified independent (Device/Increase time)   Ambulation/Gait Assistance Details L FootUp brace   Ambulation Distance (Feet) 300 Feet   Assistive device None;Straight cane  No AD over level indoor surfaces, SPC outdoors   Gait Pattern Step-through pattern;Decreased arm swing - left;Decreased hip/knee flexion - left   Ambulation Surface Level;Outdoor;Unlevel;Paved;Indoor   Gait velocity 1.79 ft/sec  without AD; L FootUp brace   Curb 6: Modified independent (Device/increase time)  using [facility]                PT Education - 02/10/15 1414    Education provided Yes   Education Details Goals, progress, and discharge plan.  Person(s) Educated Patient;Child(ren)   Methods Explanation   Comprehension Verbalized understanding          PT Short Term Goals - 01/20/15 1407    PT SHORT TERM GOAL #1   Title Pt will perform HEP with mod I using paper handout to facilitate carryover of home exercises. Target date: 01/25/15.   Baseline Met 01/18/15.   Status Achieved   PT SHORT TERM GOAL #2   Title Pt will increase Berg Balance Scale score from 36/56 to 39/56 to progress toward significant improvement in functional standing balance.  Target date: 01/25/15   Baseline 7/1: 45/56 on Berg   Status Achieved   PT SHORT TERM GOAL #3   Title Pt will improve gait speed from 1.17 ft/sec to 1.47 ft/sec to demonstrate  improved efficiency of ambulation.  Target date: 01/25/15.   Baseline 1.64 ft/sec on 01/20/15   Status Achieved           PT Long Term Goals - 02/10/15 1657    PT LONG TERM GOAL #1   Title Pt will improve Berg score from 36/56 to 42/56 to demonstrate significant improvement in functional standing balance. Target: 02/22/15.   Baseline Achieved 01/20/15 with score of 45/56.   Status Achieved   PT LONG TERM GOAL #2   Title Pt will improve gait speed from 1.17 ft/sec to 1.77 ft/sec to indicate decreased falls risk. Target: 02/22/15.   Baseline 7/22: 1.79 ft/sec without AD using L FootUp brace   Status Achieved   PT LONG TERM GOAL #3   Title Pt will ambulate >300' over unlevel surfaces with LRAD and mod I without overt LOB to indicate safety with community ambulation. Target: 02/22/15.   Baseline Achieved 7/22.   Status Achieved   PT LONG TERM GOAL #4   Title Pt will traverse community surfaces (standard curb step, inclined/declined sidewalk) with mod I using LRAD to indicate increased stability with community mobility. Target: 02/22/15.   Baseline Achieved 7/22 with L FootUp brace and SPC.   Status Achieved   PT LONG TERM GOAL #5   Title Pt will transfer into/out of personal car with mod I using LRAD. Target: 02/22/15.   Baseline Achieved 7/22/   Status Achieved               Plan - 02/10/15 1701    Clinical Impression Statement No billable services today secondary to pt feeling tired/ill. However, engaged in discussion with pt and daughter regarding pt progress. Notified pt/daughter that if pt able to traverse community obstacles and get into/out of car with mod I, then all goals met. Therefore, pt performed community ambulation, traversed community obstacles, and transferring into personal car with mod I using (daughter's) step. Pt, daughter in full agreement with D/C from outpatient PT today due to pt having met all short and long-term goals.   Pt will benefit from skilled therapeutic  intervention in order to improve on the following deficits Abnormal gait;Decreased coordination;Decreased activity tolerance;Decreased balance;Decreased mobility;Decreased endurance;Decreased strength;Increased edema;Impaired sensation;Impaired flexibility;Impaired perceived functional ability;Impaired UE functional use;Impaired tone;Dizziness;Decreased range of motion;Postural dysfunction   Rehab Potential Good   PT Frequency 2x / week   PT Duration 8 weeks   PT Treatment/Interventions Gait training;Therapeutic exercise;Patient/family education;Balance training;Stair training;Neuromuscular re-education;Therapeutic activities;Functional mobility training;Manual techniques;Vestibular;ADLs/Self Care Home Management;Orthotic Fit/Training   PT Home Exercise Plan Discharged from outpatient PT today with all goals met.   Recommended Other Services Pt received L FootUp brace today.   Consulted and Agree with Plan  of Care Patient   Family Member Consulted daughter, Maudie Mercury - February 24, 2015 1659    Functional Assessment Tool Used Merrilee Jansky Balance Scale: 45/56   Functional Limitation Mobility: Walking and moving around   Mobility: Walking and Moving Around Goal Status [phone]) At least 20 percent but less than 40 percent impaired, limited or restricted   Mobility: Walking and Moving Around Discharge Status [id number]) At least 1 percent but less than 20 percent impaired, limited or restricted      Problem List Patient Active Problem List   Diagnosis Date Noted  . Syncope 12/06/2014  . Spastic hemiplegia affecting nondominant side 07/25/2014  . 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 Left hemiparesis 06/10/2014  . Left-sided neglect 06/10/2014  . 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 Morbid obesity- BMI 40 06/06/2014  . Acute respiratory failure with hypoxia 06/05/2014  . Cerebral infarction due to occlusion of right  middle cerebral artery 06/03/2014  . Cerebral infarct 06/03/2014  . Altered mental status 06/03/2014  . GERD (gastroesophageal reflux disease) 04/12/2014  . Benign essential HTN 02/07/2014  . Dyslipidemia- statin intol 02/07/2014  . Chest pain- Low risk Myoview July 2015 02/06/2014    Billie Ruddy, PT, DPT [facility] [address] [place], [place], [address] Phone: [phone]   Fax:  [phone] 2015-02-24, 5:08 PM

## 2015-02-10 NOTE — Therapy (Signed)
[facility] [address] [place], [place], [address] Phone: [phone]   Fax:  [phone]  Patient Details  Name: Mary Le MRN: [id number] Date of Birth: 10/28/1945 Referring Provider:  [name], MD  Encounter Date: 02/10/2015 Pt was not feeling well today and she chose not to stay for OT.  RINE,KATHRYN 02/10/2015, 3:45 PM Theone Murdoch, OTR/L [phone] Phone: [phone] 3:46 PM 02/10/2015 [facility] [address] [place], [place], [address] Phone: [phone]   Fax:  [phone]

## 2015-02-10 NOTE — Therapy (Signed)
[facility] [address] [place], [place], [address] Phone: [phone]   Fax:  [phone]  Patient Details  Name: Mary Le MRN: [id number] Date of Birth: 1945-10-25 Referring Provider:  No ref. provider found  Encounter Date: 02/10/2015   PHYSICAL THERAPY DISCHARGE SUMMARY  Visits from Start of Care: 12  Current functional level related to goals / functional outcomes:     PT Long Term Goals - 02/10/15 1657    PT LONG TERM GOAL #1   Title Pt will improve Berg score from 36/56 to 42/56 to demonstrate significant improvement in functional standing balance. Target: 02/22/15.   Baseline Achieved 01/20/15 with score of 45/56.   Status Achieved   PT LONG TERM GOAL #2   Title Pt will improve gait speed from 1.17 ft/sec to 1.77 ft/sec to indicate decreased falls risk. Target: 02/22/15.   Baseline 7/22: 1.79 ft/sec without AD using L FootUp brace   Status Achieved   PT LONG TERM GOAL #3   Title Pt will ambulate >300' over unlevel surfaces with LRAD and mod I without overt LOB to indicate safety with community ambulation. Target: 02/22/15.   Baseline Achieved 7/22.   Status Achieved   PT LONG TERM GOAL #4   Title Pt will traverse community surfaces (standard curb step, inclined/declined sidewalk) with mod I using LRAD to indicate increased stability with community mobility. Target: 02/22/15.   Baseline Achieved 7/22 with L FootUp brace and SPC.   Status Achieved   PT LONG TERM GOAL #5   Title Pt will transfer into/out of personal car with mod I using LRAD. Target: 02/22/15.   Baseline Achieved 7/22/   Status Achieved        Remaining deficits: L hemiparesis and associated gait impairments; however, pt has maximized functional improvement at this time.   Education / Equipment: L FootUp brace received today. Education provided for HEP, techniques for getting into/out of personal car. Plan: Patient agrees to discharge.  Patient  goals were met. Patient is being discharged due to meeting the stated rehab goals.  ?????

## 2015-02-13 ENCOUNTER — Ambulatory Visit: Payer: Medicare Other | Admitting: Occupational Therapy

## 2015-02-13 NOTE — Telephone Encounter (Signed)
Called patient and advised her to call her cardiologist to get her blood pressure medications refilled. This is out of the scope of our practice

## 2015-02-14 ENCOUNTER — Ambulatory Visit: Payer: Medicare Other | Admitting: Occupational Therapy

## 2015-02-14 DIAGNOSIS — G8114 Spastic hemiplegia affecting left nondominant side: Secondary | ICD-10-CM

## 2015-02-14 DIAGNOSIS — I69898 Other sequelae of other cerebrovascular disease: Secondary | ICD-10-CM | POA: Diagnosis not present

## 2015-02-14 DIAGNOSIS — R531 Weakness: Secondary | ICD-10-CM | POA: Diagnosis not present

## 2015-02-14 DIAGNOSIS — Z7409 Other reduced mobility: Secondary | ICD-10-CM | POA: Diagnosis not present

## 2015-02-14 DIAGNOSIS — M25512 Pain in left shoulder: Secondary | ICD-10-CM | POA: Diagnosis not present

## 2015-02-14 DIAGNOSIS — IMO0002 Reserved for concepts with insufficient information to code with codable children: Secondary | ICD-10-CM

## 2015-02-14 DIAGNOSIS — R269 Unspecified abnormalities of gait and mobility: Secondary | ICD-10-CM | POA: Diagnosis not present

## 2015-02-14 NOTE — Therapy (Signed)
[facility] [address] [place], [place], [address] Phone: [phone]   Fax:  [phone]  Occupational Therapy Treatment  Patient Details  Name: Mary Le MRN: [id number] Date of Birth: [birth year] Referring Provider:  [name], MD  Encounter Date: 02/14/2015    Past Medical History  Diagnosis Date  . Hypertension   . Hypercholesteremia   . Back pain     arthritis  . CVA (cerebral infarction)   . Fall 11/2014    fell in bathroom, uses a cane  . Joint pain     back, history of   . Weakness     Past Surgical History  Procedure Laterality Date  . Appendectomy    . Tonsillectomy    . Hemorroidectomy    . Tubal ligation    . Radiology with anesthesia N/A 06/03/2014    Procedure: RADIOLOGY WITH ANESTHESIA;  Surgeon: [name], MD;  Location: [facility];  Service: Radiology;  Laterality: N/A;  . Tee without cardioversion N/A 06/07/2014    Procedure: TRANSESOPHAGEAL ECHOCARDIOGRAM (TEE);  Surgeon: [name], MD;  Location: [facility] ENDOSCOPY;  Service: Cardiovascular;  Laterality: N/A;  . Loop recorder implant  06/07/2014    MDT LINQ implanted by Dr [name] for cryptogenic stroke  . Loop recorder implant N/A 06/07/2014    Procedure: LOOP RECORDER IMPLANT;  Surgeon: [name], MD;  Location: [facility] CATH LAB;  Service: Cardiovascular;  Laterality: N/A;    There were no vitals filed for this visit.  Visit Diagnosis:  Weakness due to cerebrovascular accident  Impaired functional mobility and activity tolerance  Spastic hemiplegia affecting left nondominant side  Pain in joint, shoulder region, left      Subjective Assessment - 02/14/15 1405    Subjective  Pt reports wearing her splint   Patient is accompained by: Family member   Pertinent History see epic snapshot;  HTN, fluctuating BP since stroke   Currently in Pain? No/denies      Treatment: Patient was seen today only to check  progress towards goals(per pt request).  Patient agrees with plans to discharge and she feels comfortable with home program/ splint wear. Pt reports feeling discouraged regarding the lack of movement in her left hand, and the discussion  with therapist that return to the hand is less likely the further out from initial CVA she gets. Patient agrees with d/c.                         OT Short Term Goals - 02/14/15 1406    OT SHORT TERM GOAL #1   Title I with updated HEP   Status Achieved   OT SHORT TERM GOAL #2   Title Pt will donn shirt with setup consistently   Baseline met following setup   Status Achieved   OT SHORT TERM GOAL #3   Title Pt will incorporate LUE into ADLS/ IADLS at least 69% of the time as a stabilizer/ gross A with pain less than or equal to 3/10   Baseline 5% of the time   Status Not Met   OT SHORT TERM GOAL #4   Title Pt will assist with light home management / meal prep at least 3x week.   Baseline met per pt report   Status Achieved           OT Long Term Goals - 02/14/15 1408    OT LONG TERM GOAL #1  Title Pt will demonstrate ability to stand modified indpendently for light home management/ cooking x 20 mins without LOB or rest break   Baseline per pt report   Status Achieved   OT LONG TERM GOAL #2   Title Pt will increase A/ROM shoulder flexion to 60* consistently for increased ease with bathing and dressing.   Baseline met 65* with min-mod compensation   Status Achieved   OT LONG TERM GOAL #3   Title Pt will demonstrate A/ROM elbow extension to -25 for increased ease with ADLs.   Status Achieved   OT LONG TERM GOAL #4   Title Pt will donn pants with set up only on a consistent basis.   Baseline occasionally needs assistance  but sometimes she does with set up only   Status Partially Met               Plan - Mar 15, 2015 1730    Clinical Impression Statement Pt agrees with plans to discharge today. Pt reports she has been  discouraged since discussion regarding decreasing potential for movement return to the hand as she gets closer to a year post CVA.   Pt will benefit from skilled therapeutic intervention in order to improve on the following deficits (Retired) Decreased activity tolerance;Decreased balance;Decreased cognition;Decreased coordination;Decreased range of motion;Decreased safety awareness;Decreased mobility;Decreased knowledge of use of DME;Decreased strength;Increased edema;Impaired UE functional use;Impaired tone;Pain;Impaired sensation;Impaired perceived functional ability;Improper body mechanics;Obesity   Rehab Potential Good   Clinical Impairments Affecting Rehab Potential pain, spasticity, decreased balance, decrease activity tolerance   OT Duration 8 weeks   OT Treatment/Interventions Self-care/ADL training;Ultrasound;Moist Heat;Fluidtherapy;Therapeutic exercise;Neuromuscular education;Energy conservation;Manual Therapy;Functional Mobility Training;DME and/or AE instruction;Passive range of motion;Visual/perceptual remediation/compensation;Cognitive remediation/compensation;Therapeutic activities;Splinting;Patient/family education;Balance training   Plan discharge   OT Home Exercise Plan supine LUE sidelying, and trunk rotation- issued 10/06/14, paddle splint use, weightbearing through tilted stool with LUE-02/07/15   Consulted and Agree with Plan of Care Patient          G-Codes - 03/15/15 1742    Functional Assessment Tool Used 3/4 long term goals met, Pt reports cooking or performing home managment 3x week, and she is donning shirt with set up   Functional Limitation Self care   Self Care Goal Status (L5726) At least 20 percent but less than 40 percent impaired, limited or restricted   Self Care Discharge Status [phone]) At least 20 percent but less than 40 percent impaired, limited or restricted      Problem List Patient Active Problem List   Diagnosis Date Noted  . Syncope 12/06/2014  .  Spastic hemiplegia affecting nondominant side 07/25/2014  . 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 Left hemiparesis 06/10/2014  . Left-sided neglect 06/10/2014  . 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 Morbid obesity- BMI 40 06/06/2014  . Acute respiratory failure with hypoxia 06/05/2014  . Cerebral infarction due to occlusion of right middle cerebral artery 06/03/2014  . Cerebral infarct 06/03/2014  . Altered mental status 06/03/2014  . GERD (gastroesophageal reflux disease) 04/12/2014  . Benign essential HTN 02/07/2014  . Dyslipidemia- statin intol 02/07/2014  . Chest pain- Low risk Myoview July 2015 02/06/2014  OCCUPATIONAL THERAPY DISCHARGE SUMMARY    Current functional level related to goals / functional outcomes: Pt met 3/4 long term goals. Pt progress was limited by spasticity and lack of active movement in left hand.   Remaining deficits: Decreased strength, spasticity, cognitive deficits   Education / Equipment: Pt was educated  regarding HEP and splint wear, care and precautions. She verbalizes understanding of all education.  Plan: Patient agrees to discharge.  Patient goals were not met. Patient is being discharged due to not returning since the last visit.  ?????      Arelie Kuzel 02/14/2015, 5:43 PM Theone Murdoch, OTR/L [phone] Phone: [phone] 5:43 PM 02/14/2015 [facility] [address] [place], [place], [address] Phone: [phone]   Fax:  [phone]

## 2015-03-01 ENCOUNTER — Ambulatory Visit (INDEPENDENT_AMBULATORY_CARE_PROVIDER_SITE_OTHER): Payer: Medicare Other | Admitting: *Deleted

## 2015-03-01 ENCOUNTER — Encounter: Payer: Self-pay | Admitting: Internal Medicine

## 2015-03-01 DIAGNOSIS — I63411 Cerebral infarction due to embolism of right middle cerebral artery: Secondary | ICD-10-CM

## 2015-03-01 LAB — CUP PACEART INCLINIC DEVICE CHECK
MDC IDC SESS DTM: 20160810111257
MDC IDC SET ZONE DETECTION INTERVAL: 2000 ms
MDC IDC SET ZONE DETECTION INTERVAL: 370 ms
Zone Setting Detection Interval: 3000 ms

## 2015-03-01 NOTE — Progress Notes (Signed)
Loop check in clinic.  Pt with 0 tachy episodes; 0 brady episodes; (8) asystole---max dur. 6 sec---undersensing; 0 symptom episodes; 0 AF episodes. Sensitivity increased from 0.09mV to 0.057mV, brady/pause detection d/c'd per JA. Plan to follow up via [facility] QMO and with JA prn.

## 2015-03-03 ENCOUNTER — Ambulatory Visit (INDEPENDENT_AMBULATORY_CARE_PROVIDER_SITE_OTHER): Payer: Medicare Other | Admitting: *Deleted

## 2015-03-03 DIAGNOSIS — I63411 Cerebral infarction due to embolism of right middle cerebral artery: Secondary | ICD-10-CM | POA: Diagnosis not present

## 2015-03-07 NOTE — Progress Notes (Signed)
Loop recorder 

## 2015-03-17 ENCOUNTER — Encounter: Payer: Self-pay | Admitting: Physical Medicine & Rehabilitation

## 2015-03-17 ENCOUNTER — Encounter: Payer: Medicare Other | Attending: Physical Medicine & Rehabilitation

## 2015-03-17 ENCOUNTER — Ambulatory Visit (HOSPITAL_BASED_OUTPATIENT_CLINIC_OR_DEPARTMENT_OTHER): Payer: Medicare Other | Admitting: Physical Medicine & Rehabilitation

## 2015-03-17 VITALS — BP 136/58 | HR 77 | Resp 14

## 2015-03-17 DIAGNOSIS — I63311 Cerebral infarction due to thrombosis of right middle cerebral artery: Secondary | ICD-10-CM | POA: Insufficient documentation

## 2015-03-17 DIAGNOSIS — M7502 Adhesive capsulitis of left shoulder: Secondary | ICD-10-CM | POA: Insufficient documentation

## 2015-03-17 DIAGNOSIS — G8114 Spastic hemiplegia affecting left nondominant side: Secondary | ICD-10-CM

## 2015-03-17 DIAGNOSIS — G8110 Spastic hemiplegia affecting unspecified side: Secondary | ICD-10-CM

## 2015-03-17 DIAGNOSIS — R414 Neurologic neglect syndrome: Secondary | ICD-10-CM | POA: Diagnosis not present

## 2015-03-17 NOTE — Progress Notes (Signed)
Botox Injection for spasticity using needle EMG guidance  Dilution: 50 Units/ml Indication: Severe spasticity which interferes with ADL,mobility and/or  hygiene and is unresponsive to medication management and other conservative care Informed consent was obtained after describing risks and benefits of the procedure with the patient. This includes bleeding, bruising, infection, excessive weakness, or medication side effects. A REMS form is on file and signed. Needle: 2" 25g needle electrode Number of units per muscle Pectoralis 50 units Biceps 100 units Brachioradialis 50 units Pronator teres 25 units Flexor digitorum longus 50 units Flexor digitorum superficialis 50 units Flexor carpi ulnaris 50 units All injections were done after obtaining appropriate EMG activity and after negative drawback for blood. The patient tolerated the procedure well. Post procedure instructions were given. A followup appointment was made.

## 2015-03-17 NOTE — Patient Instructions (Signed)

## 2015-03-20 LAB — CUP PACEART REMOTE DEVICE CHECK: MDC IDC SESS DTM: 20160829105927

## 2015-03-23 ENCOUNTER — Other Ambulatory Visit: Payer: Self-pay | Admitting: Neurology

## 2015-03-23 NOTE — Telephone Encounter (Signed)
Prescribed at [facility] on 02/19.  Pt has follow up appt scheduled

## 2015-03-29 ENCOUNTER — Encounter: Payer: Self-pay | Admitting: Internal Medicine

## 2015-04-03 ENCOUNTER — Ambulatory Visit (INDEPENDENT_AMBULATORY_CARE_PROVIDER_SITE_OTHER): Payer: Medicare Other | Admitting: *Deleted

## 2015-04-03 DIAGNOSIS — I63411 Cerebral infarction due to embolism of right middle cerebral artery: Secondary | ICD-10-CM | POA: Diagnosis not present

## 2015-04-03 LAB — CUP PACEART REMOTE DEVICE CHECK: MDC IDC SESS DTM: 20160916074656

## 2015-04-04 NOTE — Progress Notes (Signed)
Loop recorder 

## 2015-04-07 NOTE — Progress Notes (Signed)
[facility] summary report received. Battery status OK. Normal device function. No new symptom episodes, tachy episodes, brady, or pause episodes. No new AF episodes. Monthly summary reports and ROV with JA PRN. 

## 2015-04-14 ENCOUNTER — Encounter: Payer: Self-pay | Admitting: Internal Medicine

## 2015-04-28 ENCOUNTER — Encounter: Payer: Self-pay | Admitting: Physical Medicine & Rehabilitation

## 2015-04-28 ENCOUNTER — Encounter: Payer: Medicare Other | Attending: Physical Medicine & Rehabilitation

## 2015-04-28 ENCOUNTER — Ambulatory Visit (HOSPITAL_BASED_OUTPATIENT_CLINIC_OR_DEPARTMENT_OTHER): Payer: Medicare Other | Admitting: Physical Medicine & Rehabilitation

## 2015-04-28 VITALS — BP 155/66 | HR 84

## 2015-04-28 DIAGNOSIS — I63311 Cerebral infarction due to thrombosis of right middle cerebral artery: Secondary | ICD-10-CM | POA: Diagnosis not present

## 2015-04-28 DIAGNOSIS — G8110 Spastic hemiplegia affecting unspecified side: Secondary | ICD-10-CM | POA: Insufficient documentation

## 2015-04-28 DIAGNOSIS — M7502 Adhesive capsulitis of left shoulder: Secondary | ICD-10-CM | POA: Insufficient documentation

## 2015-04-28 DIAGNOSIS — R414 Neurologic neglect syndrome: Secondary | ICD-10-CM | POA: Diagnosis not present

## 2015-04-28 DIAGNOSIS — I639 Cerebral infarction, unspecified: Secondary | ICD-10-CM | POA: Diagnosis not present

## 2015-04-28 NOTE — Patient Instructions (Signed)
Plan for spasticity  Management Resume Repeat Botox in 6 weeks will fine-tune the doses and muscles injected  Left musculocutaneous block with phenol to reduce left biceps spasticity

## 2015-04-28 NOTE — Progress Notes (Signed)
Subjective:    Patient ID: Mary Le, female    DOB: 1946-01-30, 69 y.o.   MRN: [id number]  HPI 03/17/2015 Botox injection Pectoralis 50 units Biceps 100 units Brachioradialis 50 units Pronator teres 25 units Flexor digitorum longus 50 units Flexor digitorum superficialis 50 units Flexor carpi ulnaris 50 units  Patient had good effect except the left hand is still somewhat flexed at the fingers No pain from injections Able to lift up the left arm better. Able to extend the left arm and wrist better Left hand is more open  Pain Inventory Average Pain 4 Pain Right Now 0 My pain is stabbing  In the last 24 hours, has pain interfered with the following? General activity 0 Relation with others 0 Enjoyment of life 0 What TIME of day is your pain at its worst? morning Sleep (in general) Good  Pain is worse with: inactivity Pain improves with: medication Relief from Meds: 7  Mobility walk without assistance walk with assistance use a cane how many minutes can you walk? 7 ability to climb steps?  yes do you drive?  no needs help with transfers  Function retired I need assistance with the following:  feeding, dressing and bathing  Neuro/Psych depression anxiety  Prior Studies Any changes since last visit?  no  Physicians involved in your care Any changes since last visit?  no   Family History  Problem Relation Age of Onset  . Coronary artery disease Mother 54    MI  . Kidney disease Father    Social History   Social History  . Marital Status: Married    Spouse Name: N/A  . Number of Children: N/A  . Years of Education: N/A   Social History Main Topics  . Smoking status: Former Smoker -- 5 years  . Smokeless tobacco: Never Used  . Alcohol Use: No  . Drug Use: No  . Sexual Activity: Not Asked   Other Topics Concern  . None   Social History Narrative   Lives in [place]    Married. Education: [employer].    Past Surgical History  Procedure  Laterality Date  . Appendectomy    . Tonsillectomy    . Hemorroidectomy    . Tubal ligation    . Radiology with anesthesia N/A 06/03/2014    Procedure: RADIOLOGY WITH ANESTHESIA;  Surgeon: [name], MD;  Location: [facility];  Service: Radiology;  Laterality: N/A;  . Tee without cardioversion N/A 06/07/2014    Procedure: TRANSESOPHAGEAL ECHOCARDIOGRAM (TEE);  Surgeon: [name], MD;  Location: [facility] ENDOSCOPY;  Service: Cardiovascular;  Laterality: N/A;  . Loop recorder implant  06/07/2014    MDT LINQ implanted by Dr [name] for cryptogenic stroke  . Loop recorder implant N/A 06/07/2014    Procedure: LOOP RECORDER IMPLANT;  Surgeon: [name], MD;  Location: [facility] CATH LAB;  Service: Cardiovascular;  Laterality: N/A;   Past Medical History  Diagnosis Date  . Hypertension   . Hypercholesteremia   . Back pain     arthritis  . CVA (cerebral infarction)   . Fall 11/2014    fell in bathroom, uses a cane  . Joint pain     back, history of   . Weakness    BP 155/66 mmHg  Pulse 84  SpO2 98%  Opioid Risk Score:   Fall Risk Score:  `1  Depression screen PHQ 2/9  Depression screen [facility] 2/9 03/17/2015 01/15/2015 10/28/2014 01/03/2014  Decreased Interest 0 0 0 0  Down, Depressed, Hopeless 0 0 0 0  PHQ - 2 Score 0 0 0 0  Altered sleeping - - 0 -  Change in appetite - - 0 -  Feeling bad or failure about yourself  - - 1 -  Trouble concentrating - - 0 -  Moving slowly or fidgety/restless - - 0 -  Suicidal thoughts - - 0 -  PHQ-9 Score - - 1 -     Review of Systems  Musculoskeletal: Positive for gait problem.  Psychiatric/Behavioral: Positive for dysphoric mood. The patient is nervous/anxious.   All other systems reviewed and are negative.      Objective:   Physical Exam  Ashworth grade 2 at the left finger flexors Ashworth grade 1 at the wrist flexor Ashworth grade 2 at the pectoralis Ashworth grade 2 at the biceps  Motor strength 3 minus at the deltoid and biceps  2 minus at the triceps 2 minus at the wrist flexors, 0 at the finger extensors 0 at the finger flexors      Assessment & Plan:  1. Left spastic hemiplegia due to right CVA. Overall good effect from Botox. She received the maximum dosage of 400 units. Recommended following treatment next time in 6 weeks increase pectoralis 100 units Continue brachioradialis 50 units Increase pronator to 50 units Continue flexor digitorum sublimis 50 units Continue flexor digitorum profundus 50 units Continue flexor carpi ulnaris 50 units Increase flexor carpi radialis 50 units  Referral to outpatient OT  Also discussed musculocutaneous nerve block with phenol after the next Botox injection

## 2015-05-03 ENCOUNTER — Ambulatory Visit (INDEPENDENT_AMBULATORY_CARE_PROVIDER_SITE_OTHER): Payer: Medicare Other | Admitting: *Deleted

## 2015-05-03 DIAGNOSIS — I63411 Cerebral infarction due to embolism of right middle cerebral artery: Secondary | ICD-10-CM | POA: Diagnosis not present

## 2015-05-04 NOTE — Progress Notes (Signed)
Loop recorder 

## 2015-05-08 LAB — CUP PACEART REMOTE DEVICE CHECK: Date Time Interrogation Session: 20161012183621

## 2015-05-08 NOTE — Progress Notes (Signed)
[facility] summary report received. Battery status OK. Normal device function. No new symptom episodes, tachy episodes, brady, or pause episodes. No new AF episodes. Monthly summary reports and ROV with JA PRN. 

## 2015-05-16 ENCOUNTER — Encounter: Payer: Self-pay | Admitting: Occupational Therapy

## 2015-05-16 ENCOUNTER — Ambulatory Visit: Payer: Medicare Other | Attending: Physical Medicine & Rehabilitation | Admitting: Occupational Therapy

## 2015-05-16 DIAGNOSIS — I69898 Other sequelae of other cerebrovascular disease: Secondary | ICD-10-CM | POA: Diagnosis not present

## 2015-05-16 DIAGNOSIS — M25512 Pain in left shoulder: Secondary | ICD-10-CM | POA: Insufficient documentation

## 2015-05-16 DIAGNOSIS — R531 Weakness: Secondary | ICD-10-CM | POA: Diagnosis not present

## 2015-05-16 DIAGNOSIS — R4189 Other symptoms and signs involving cognitive functions and awareness: Secondary | ICD-10-CM | POA: Insufficient documentation

## 2015-05-16 DIAGNOSIS — IMO0002 Reserved for concepts with insufficient information to code with codable children: Secondary | ICD-10-CM

## 2015-05-16 DIAGNOSIS — G8114 Spastic hemiplegia affecting left nondominant side: Secondary | ICD-10-CM | POA: Diagnosis not present

## 2015-05-16 NOTE — Therapy (Signed)
[facility] [address] [place], [place], [address] Phone: [phone]   Fax:  [phone]  Occupational Therapy Evaluation  Patient Details  Name: Mary Le MRN: [id number] Date of Birth: 1946-05-18 Referring Provider: Dr. Alysia Penna  Encounter Date: 05/16/2015      OT End of Session - 05/16/15 1421    Visit Number 1   Number of Visits 9   Date for OT Re-Evaluation 06/16/15   Authorization Type medicare - needs G code and KX modifier   Activity Tolerance Patient tolerated treatment well      Past Medical History  Diagnosis Date  . Hypertension   . Hypercholesteremia   . Back pain     arthritis  . CVA (cerebral infarction)   . Fall 11/2014    fell in bathroom, uses a cane  . Joint pain     back, history of   . Weakness     Past Surgical History  Procedure Laterality Date  . Appendectomy    . Tonsillectomy    . Hemorroidectomy    . Tubal ligation    . Radiology with anesthesia N/A 06/03/2014    Procedure: RADIOLOGY WITH ANESTHESIA;  Surgeon: [name], MD;  Location: [facility];  Service: Radiology;  Laterality: N/A;  . Tee without cardioversion N/A 06/07/2014    Procedure: TRANSESOPHAGEAL ECHOCARDIOGRAM (TEE);  Surgeon: [name], MD;  Location[facility] ENDOSCOPY;  Service: Cardiovascular;  Laterality: N/A;  . Loop recorder implant  06/07/2014    MDT LINQ implanted by Dr [name] for cryptogenic stroke  . Loop recorder implant N/A 06/07/2014    Procedure: LOOP RECORDER IMPLANT;  Surgeon: [name], MD;  Location: [facility] CATH LAB;  Service: Cardiovascular;  Laterality: N/A;    There were no vitals filed for this visit.  Visit Diagnosis:  Spastic hemiplegia affecting left nondominant side ([place]) - Plan: Ot plan of care cert/re-cert  Pain in joint, shoulder region, left - Plan: Ot plan of care cert/re-cert  Weakness due to cerebrovascular accident - Plan: Ot plan of care cert/re-cert      Subjective Assessment - 05/16/15 1324    Patient is accompained by: Family member   Pertinent History see epic snapshot;  HTN, fluctuating BP since stroke   Limitations LOOP RECORDER   Patient Stated Goals use of fingers and hand   Currently in Pain? Yes   Pain Score 3    Pain Location Hand   Pain Orientation Left   Pain Descriptors / Indicators Sore   Pain Type Chronic pain   Pain Onset More than a month ago   Pain Frequency Intermittent   Aggravating Factors  immobolized too long, early morning   Pain Relieving Factors stretches, massage           [facility] OT Assessment - 05/16/15 0001    Assessment   Diagnosis R MCA CVA   Referring Provider Dr. Alysia Penna   Onset Date 06/04/15   Assessment Pt s/p CVA in November and this is patient's 3rd admission to O.T.    Prior Therapy outpatient rehab   Precautions   Precautions --  Loop recorder, no driving   Balance Screen   Has the patient fallen in the past 6 months Yes   How many times? 1   Has the patient had a decrease in activity level because of a fear of falling?  No   Is the patient reluctant to leave their home because of a fear of  falling?  No   Home  Environment   Family/patient expects to be discharged to: Private residence   Living Arrangements Spouse/significant other  and daughter   Available Help at Discharge Family   Type of [place] One level   Bathroom Shower/Tub [place]  with built in [place] - single point;Bedside commode;Shower seat  w/c   Lives With Spouse  and daughter   Prior Function   Level of Independence Needs assistance with ADLs;Needs assistance with homemaking   Vocation Retired   ADL   ADL comments Independent with eating/grooming. Min assist for UB dressing, mod to max assist for LB dressing. Supervision to min assist for shower transfer, but bathing mod I seated. Pt requires assist for perineal care during toileting.  Dependent for IADLS except easy snack/cereal   Mobility   Mobility Status --  uses cane outside home   Written Expression   Dominant Hand Right   Vision - History   Baseline Vision Wears glasses all the time   Sensation   Light Touch Impaired by gross assessment   Additional Comments abscent Lt hand/forearm, inconsistent in locating stimuli more proximal   Coordination   Box and Blocks unable   Edema   Edema moderate edema in LT arm and leg   Tone   Assessment Location Left Upper Extremity   ROM / Strength   AROM / PROM / Strength AROM;Strength   Strength   Overall Strength Comments Did not test strength due to tone/spasticity LUE. ROM as follows: pt has minimal shoulder movement (approx. 45* scaption) dominated by synergy pattern, min elbow flexion, elbow ext to approx. -25. Pt has no active finger extension and rest in flexion at MP joints. Pt can only tolerate approx. 75* PROM  shoulder flexion and 60* sh. abduction due to pain   Hand Function   Left Hand Grip (lbs) unable                           OT Short Term Goals - 05/16/15 1434    OT SHORT TERM GOAL #1   Title none           OT Long Term Goals - 05/16/15 1425    OT LONG TERM GOAL #1   Title Pt/family to be independent with splint wear and care   Time 4   Period Weeks   Status New   OT LONG TERM GOAL #2   Title Pt/family will be independent with updated HEP   Time 4   Period Weeks   Status New   OT LONG TERM GOAL #3   Title Pt/family to verbalize understanding with hemi techniques and A/E use to increase independence with dressing               Plan - 05/16/15 1422    Clinical Impression Statement Pt is a 69 y.o. female who presents to outpatient rehab (3rd admission) s/p CVA  on 06/03/14 with residual Lt spastic hemiplegia. Pt recently had botox injections to LUE with reports of increased P/ROM   OT Frequency 2x / week   OT Duration 4 weeks  plus evaluation   OT  Treatment/Interventions Self-care/ADL training;Moist Heat;Electrical Stimulation;Therapeutic exercise;Patient/family education;Neuromuscular education;Manual Therapy;Splinting;DME and/or AE instruction;Therapeutic activities;Fluidtherapy;Scar mobilization;Passive range of motion   Plan Splint adjustments prn, initiate HEP   Consulted and Agree with Plan of Care Patient  Family Member Consulted daughter          G-Codes - 2015/05/18 10/22/1425    Functional Assessment Tool Used clinical judgement    Functional Limitation Changing and maintaining body position   Changing and Maintaining Body Position Current Status [phone]) At least 40 percent but less than 60 percent impaired, limited or restricted   Changing and Maintaining Body Position Goal Status ([id number]) At least 20 percent but less than 40 percent impaired, limited or restricted              Problem List Patient Active Problem List   Diagnosis Date Noted  . Syncope 12/06/2014  . Spastic hemiplegia affecting nondominant side ([place]) 07/25/2014  . 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 Left hemiparesis ([place]) 06/10/2014  . Left-sided neglect 06/10/2014  . 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 Morbid obesity- BMI 40 06/06/2014  . Acute respiratory failure with hypoxia ([place]) 06/05/2014  . Cerebral infarction due to occlusion of right middle cerebral artery ([place]) 06/03/2014  . Cerebral infarct ([place]) 06/03/2014  . Altered mental status 06/03/2014  . GERD (gastroesophageal reflux disease) 04/12/2014  . Benign essential HTN 02/07/2014  . Dyslipidemia- statin intol 02/07/2014  . Chest pain- Low risk Myoview July 2015 02/06/2014    Carey Bullocks, OTR/L 18-May-2015, 2:38 PM  [facility] [address] [place], [place], [address] Phone: [phone]   Fax:  [phone]  Name: Mary Le MRN:  [id number] Date of Birth: [birth year]

## 2015-05-18 ENCOUNTER — Encounter: Payer: Self-pay | Admitting: Occupational Therapy

## 2015-05-18 ENCOUNTER — Telehealth: Payer: Self-pay | Admitting: Occupational Therapy

## 2015-05-18 ENCOUNTER — Ambulatory Visit: Payer: Medicare Other | Admitting: Occupational Therapy

## 2015-05-18 DIAGNOSIS — G8114 Spastic hemiplegia affecting left nondominant side: Secondary | ICD-10-CM | POA: Diagnosis not present

## 2015-05-18 DIAGNOSIS — R531 Weakness: Secondary | ICD-10-CM | POA: Diagnosis not present

## 2015-05-18 DIAGNOSIS — I69398 Other sequelae of cerebral infarction: Principal | ICD-10-CM

## 2015-05-18 DIAGNOSIS — R4189 Other symptoms and signs involving cognitive functions and awareness: Secondary | ICD-10-CM

## 2015-05-18 DIAGNOSIS — I69359 Hemiplegia and hemiparesis following cerebral infarction affecting unspecified side: Secondary | ICD-10-CM

## 2015-05-18 DIAGNOSIS — I69898 Other sequelae of other cerebrovascular disease: Secondary | ICD-10-CM | POA: Diagnosis not present

## 2015-05-18 DIAGNOSIS — M25512 Pain in left shoulder: Secondary | ICD-10-CM | POA: Diagnosis not present

## 2015-05-18 NOTE — Therapy (Signed)
[facility] [address] [place], [place], [address] Phone: [phone]   Fax:  [phone]  Occupational Therapy Treatment  Patient Details  Name: Mary Le MRN: [id number] Date of Birth: 04-21-1946 Referring Provider: Dr. Alysia Penna  Encounter Date: 05/18/2015      OT End of Session - 05/18/15 1545    Visit Number 2   Number of Visits 9   Date for OT Re-Evaluation 06/16/15   Authorization Type medicare - needs G code and KX modifier   OT Start Time 1317   OT Stop Time 1400   OT Time Calculation (min) 43 min   Activity Tolerance Patient tolerated treatment well      Past Medical History  Diagnosis Date  . Hypertension   . Hypercholesteremia   . Back pain     arthritis  . CVA (cerebral infarction)   . Fall 11/2014    fell in bathroom, uses a cane  . Joint pain     back, history of   . Weakness     Past Surgical History  Procedure Laterality Date  . Appendectomy    . Tonsillectomy    . Hemorroidectomy    . Tubal ligation    . Radiology with anesthesia N/A 06/03/2014    Procedure: RADIOLOGY WITH ANESTHESIA;  Surgeon: [name], MD;  Location: [facility];  Service: Radiology;  Laterality: N/A;  . Tee without cardioversion N/A 06/07/2014    Procedure: TRANSESOPHAGEAL ECHOCARDIOGRAM (TEE);  Surgeon: [name], MD;  Location: [facility] ENDOSCOPY;  Service: Cardiovascular;  Laterality: N/A;  . Loop recorder implant  06/07/2014    MDT LINQ implanted by Dr [name] for cryptogenic stroke  . Loop recorder implant N/A 06/07/2014    Procedure: LOOP RECORDER IMPLANT;  Surgeon: [name], MD;  Location: [facility] CATH LAB;  Service: Cardiovascular;  Laterality: N/A;    There were no vitals filed for this visit.  Visit Diagnosis:  Spastic hemiplegia affecting left nondominant side (HCC)  Impaired cognition      Subjective Assessment - 05/18/15 1322    Subjective  this splint hurts my thumb a little  bit   Pertinent History see epic snapshot;  HTN, fluctuating BP since stroke   Limitations LOOP RECORDER   Patient Stated Goals use of fingers and hand   Currently in Pain? No/denies                      OT Treatments/Exercises (OP) - 05/18/15 1401    Splinting   Splinting Pt wearing splint today and reports that the splint is uncomfortable around her thumb and that splint is "not really stretching out my fingers right now"  Modifications made to splint to reduce tension on web space slightly as well as to encourage more finger extension with wrist in slight extension. All straps and padding replaced. Pt stated splint was much more comfortable and that she could feel the stretch on her fingers and it "felt really good."  Instucted dtr and pt to monitor splint for pressure areas or discomcomfort due to adjustments.  Also instructed dtr on how tp don splint to get best fit. Dtr to bring in HEP next visit to review and make adjustments as needed.                   OT Short Term Goals - 05/18/15 1543    OT SHORT TERM GOAL #1   Title none  OT Long Term Goals - 05/18/15 1543    OT LONG TERM GOAL #1   Title Pt/family to be independent with splint wear and care   Time 4   Period Weeks   Status On-going   OT LONG TERM GOAL #2   Title Pt/family will be independent with updated HEP   Time 4   Period Weeks   Status On-going   OT LONG TERM GOAL #3   Title Pt/family to verbalize understanding with hemi techniques and A/E use to increase independence with dressing   Status On-going               Plan - 05/18/15 1543    Clinical Impression Statement Pt and dtr progressing toward goals. Modified resting hand splint today and pt and dtr able to verbalize wearing schedule and care of splint.   Pt will benefit from skilled therapeutic intervention in order to improve on the following deficits (Retired) Decreased activity tolerance;Decreased  balance;Decreased cognition;Decreased coordination;Decreased range of motion;Decreased safety awareness;Decreased mobility;Decreased knowledge of use of DME;Decreased strength;Increased edema;Impaired UE functional use;Impaired tone;Pain;Impaired sensation;Impaired perceived functional ability;Improper body mechanics;Obesity   Rehab Potential Good   Clinical Impairments Affecting Rehab Potential pain, spasticity, decreased balance, decrease activity tolerance   OT Frequency 2x / week   OT Duration 4 weeks   OT Treatment/Interventions Self-care/ADL training;Moist Heat;Electrical Stimulation;Therapeutic exercise;Patient/family education;Neuromuscular education;Manual Therapy;Splinting;DME and/or AE instruction;Therapeutic activities;Fluidtherapy;Scar mobilization;Passive range of motion   Plan check splint, dtr to bring in HEP -adjust as necessary   Consulted and Agree with Plan of Care Patient   Family Member Consulted daughter        Problem List Patient Active Problem List   Diagnosis Date Noted  . Syncope 12/06/2014  . Spastic hemiplegia affecting nondominant side ([place]) 07/25/2014  . 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 Left hemiparesis ([place]) 06/10/2014  . Left-sided neglect 06/10/2014  . 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 Morbid obesity- BMI 40 06/06/2014  . Acute respiratory failure with hypoxia ([place]) 06/05/2014  . Cerebral infarction due to occlusion of right middle cerebral artery ([place]) 06/03/2014  . Cerebral infarct ([place]) 06/03/2014  . Altered mental status 06/03/2014  . GERD (gastroesophageal reflux disease) 04/12/2014  . Benign essential HTN 02/07/2014  . Dyslipidemia- statin intol 02/07/2014  . Chest pain- Low risk Myoview July 2015 02/06/2014    Quay Burow, OTR/L 05/18/2015, 4:19 PM  [facility] [address] [place], [place], [address] Phone: [phone]   Fax:  [phone]  Name: Mary Le MRN: [id number] Date of Birth: Feb 03, 1946

## 2015-05-18 NOTE — Telephone Encounter (Signed)
Dr. Letta Pate,   Nevin Bloodgood Paige's daughter had said you were okay with therapy doing estim to the hand with a loop recorder. Is this correct? If this is ok, please send a referral for estim to [facility] via [facility] or fax to [phone]. Thank you  Redmond Baseman, OTR/L

## 2015-05-23 ENCOUNTER — Ambulatory Visit: Payer: Medicare Other | Attending: Physical Medicine & Rehabilitation | Admitting: Occupational Therapy

## 2015-05-23 ENCOUNTER — Encounter: Payer: Self-pay | Admitting: Occupational Therapy

## 2015-05-23 DIAGNOSIS — I69898 Other sequelae of other cerebrovascular disease: Secondary | ICD-10-CM | POA: Diagnosis not present

## 2015-05-23 DIAGNOSIS — M25512 Pain in left shoulder: Secondary | ICD-10-CM | POA: Diagnosis not present

## 2015-05-23 DIAGNOSIS — G8114 Spastic hemiplegia affecting left nondominant side: Secondary | ICD-10-CM | POA: Diagnosis not present

## 2015-05-23 DIAGNOSIS — IMO0002 Reserved for concepts with insufficient information to code with codable children: Secondary | ICD-10-CM

## 2015-05-23 DIAGNOSIS — R531 Weakness: Secondary | ICD-10-CM | POA: Diagnosis not present

## 2015-05-23 DIAGNOSIS — R4189 Other symptoms and signs involving cognitive functions and awareness: Secondary | ICD-10-CM | POA: Diagnosis not present

## 2015-05-23 NOTE — Patient Instructions (Addendum)
Home Exercise Review for Left Arm:  Mary Le and I reviewed her current home program today via the computer. Please keep all current exercises for her arm and add the following exercise daily (1-2 times per day). [name] did not have any pain with this activity. Please monitor all exercises at home and always work within her level of comfort (avoid pain)  1. Sit on a firm surface with feet flat on the floor.  Clasp your hands and interlace your fingers.  Bend over and reach for the floor with elbows straight. Relax your head into the stretch.  HOLD FOR A  SLOW COUNT OF 5. Then sit back up. Repeat 10 times.  This will work on reducing the tightness, as well as the range of motion for the shoulder, the elbow, the wrist and fingers. This is also a good stretch for her lower back and her left leg.

## 2015-05-23 NOTE — Therapy (Signed)
[facility] [address] [place], [place], [address] Phone: [phone]   Fax:  [phone]  Occupational Therapy Treatment  Patient Details  Name: Mary Le MRN: [id number] Date of Birth: October 30, 1945 Referring Provider: Dr. Alysia Penna  Encounter Date: 05/23/2015      OT End of Session - 05/23/15 1255    Visit Number 3   Number of Visits 9   Date for OT Re-Evaluation 06/16/15   Authorization Type medicare - needs G code and KX modifier   Authorization Time Period 60 days - end of certification period is 02/25/15   Authorization - Visit Number 3   Authorization - Number of Visits 9   OT Start Time 1017   OT Stop Time 1059   OT Time Calculation (min) 42 min   Activity Tolerance Patient tolerated treatment well      Past Medical History  Diagnosis Date  . Hypertension   . Hypercholesteremia   . Back pain     arthritis  . CVA (cerebral infarction)   . Fall 11/2014    fell in bathroom, uses a cane  . Joint pain     back, history of   . Weakness     Past Surgical History  Procedure Laterality Date  . Appendectomy    . Tonsillectomy    . Hemorroidectomy    . Tubal ligation    . Radiology with anesthesia N/A 06/03/2014    Procedure: RADIOLOGY WITH ANESTHESIA;  Surgeon: [name], MD;  Location: [facility];  Service: Radiology;  Laterality: N/A;  . Tee without cardioversion N/A 06/07/2014    Procedure: TRANSESOPHAGEAL ECHOCARDIOGRAM (TEE);  Surgeon: [name], MD;  Location: [facility] ENDOSCOPY;  Service: Cardiovascular;  Laterality: N/A;  . Loop recorder implant  06/07/2014    MDT LINQ implanted by Dr [name] for cryptogenic stroke  . Loop recorder implant N/A 06/07/2014    Procedure: LOOP RECORDER IMPLANT;  Surgeon: [name], MD;  Location: [facility] CATH LAB;  Service: Cardiovascular;  Laterality: N/A;    There were no vitals filed for this visit.  Visit Diagnosis:  Spastic hemiplegia affecting  left nondominant side (HCC)  Impaired cognition  Pain in joint, shoulder region, left  Weakness due to cerebrovascular accident      Subjective Assessment - 05/23/15 1025    Subjective  My daughter just dropped me off - I think she forgot to to give me my HEP for you to look at.   Pertinent History see epic snapshot;  HTN, fluctuating BP since stroke   Limitations LOOP RECORDER   Patient Stated Goals use of fingers and hand   Currently in Pain? No/denies                      OT Treatments/Exercises (OP) - 05/23/15 0001    Exercises   Exercises Shoulder   Shoulder Exercises: Seated   Other Seated Exercises Reviewed entire HEP from prior visits with pt - pt to continue with previous program with addition of one exercise - see pt instruction for details. Dtr was supposed to bring in exercises and stay for session however did not. Accessed current program via Epic and pt given new exercise in writing. Also practiced exercise and dtr informed of this when she arrived to pick up pt.                 OT Education - 05/23/15 1253    Education provided Yes  Education Details reveiwed and updated HEP for LUE   Person(s) Educated Patient   Methods Explanation;Demonstration;Verbal cues;Handout   Comprehension Verbalized understanding;Returned demonstration  dtr informed of changes when she arrived to pick up pt - also given in writing          OT Short Term Goals - 05/18/15 1543    OT SHORT TERM GOAL #1   Title none           OT Long Term Goals - 05/23/15 1253    OT LONG TERM GOAL #1   Title Pt/family to be independent with splint wear and care   Time 4   Period Weeks   Status Achieved   OT LONG TERM GOAL #2   Title Pt/family will be independent with updated HEP   Time 4   Period Weeks   Status Achieved   OT LONG TERM GOAL #3   Title Pt/family to verbalize understanding with hemi techniques and A/E use to increase independence with dressing    Status On-going               Plan - 05/23/15 1254    Clinical Impression Statement Pt has met 2/3 LTG's and is progressing per plan. Pt reports she is tolerating modified splint without any problems.    Pt will benefit from skilled therapeutic intervention in order to improve on the following deficits (Retired) Decreased activity tolerance;Decreased balance;Decreased cognition;Decreased coordination;Decreased range of motion;Decreased safety awareness;Decreased mobility;Decreased knowledge of use of DME;Decreased strength;Increased edema;Impaired UE functional use;Impaired tone;Pain;Impaired sensation;Impaired perceived functional ability;Improper body mechanics;Obesity   Rehab Potential Good   Clinical Impairments Affecting Rehab Potential pain, spasticity, decreased balance, decrease activity tolerance   OT Treatment/Interventions Self-care/ADL training;Moist Heat;Electrical Stimulation;Therapeutic exercise;Patient/family education;Neuromuscular education;Manual Therapy;Splinting;DME and/or AE instruction;Therapeutic activities;Fluidtherapy;Scar mobilization;Passive range of motion   Plan address ADL's   OT Home Exercise Plan supine LUE sidelying, and trunk rotation- issued 10/06/14, paddle splint use, weightbearing through tilted stool with LUE-02/07/15; added one exercise 05/23/2015   Consulted and Agree with Plan of Care Patient   Family Member Consulted daughter        Problem List Patient Active Problem List   Diagnosis Date Noted  . Syncope 12/06/2014  . Spastic hemiplegia affecting nondominant side ([place]) 07/25/2014  . 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 Left hemiparesis ([place]) 06/10/2014  . Left-sided neglect 06/10/2014  . 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 Morbid obesity- BMI 40 06/06/2014  . Acute respiratory failure with hypoxia ([place]) 06/05/2014  . Cerebral infarction due to occlusion of  right middle cerebral artery ([place]) 06/03/2014  . Cerebral infarct ([place]) 06/03/2014  . Altered mental status 06/03/2014  . GERD (gastroesophageal reflux disease) 04/12/2014  . Benign essential HTN 02/07/2014  . Dyslipidemia- statin intol 02/07/2014  . Chest pain- Low risk Myoview July 2015 02/06/2014    Quay Burow, OTR/L 05/23/2015, 12:57 PM  [facility] [address] [place], [place], [address] Phone: [phone]   Fax:  [phone]  Name: Traeh Milroy MRN: [id number] Date of Birth: 1946/05/26

## 2015-05-25 ENCOUNTER — Ambulatory Visit: Payer: Medicare Other | Admitting: Occupational Therapy

## 2015-05-29 ENCOUNTER — Ambulatory Visit: Payer: Medicare Other | Admitting: Occupational Therapy

## 2015-06-01 ENCOUNTER — Ambulatory Visit: Payer: Medicare Other | Admitting: Occupational Therapy

## 2015-06-02 ENCOUNTER — Ambulatory Visit (INDEPENDENT_AMBULATORY_CARE_PROVIDER_SITE_OTHER): Payer: Medicare Other | Admitting: *Deleted

## 2015-06-02 DIAGNOSIS — I63411 Cerebral infarction due to embolism of right middle cerebral artery: Secondary | ICD-10-CM

## 2015-06-02 NOTE — Progress Notes (Signed)
[facility] Summary Report / Loop recorder 

## 2015-06-05 ENCOUNTER — Ambulatory Visit: Payer: Medicare Other | Admitting: Occupational Therapy

## 2015-06-06 ENCOUNTER — Other Ambulatory Visit (HOSPITAL_COMMUNITY): Payer: Self-pay | Admitting: Interventional Radiology

## 2015-06-06 DIAGNOSIS — I639 Cerebral infarction, unspecified: Secondary | ICD-10-CM

## 2015-06-08 ENCOUNTER — Encounter: Payer: Self-pay | Admitting: Occupational Therapy

## 2015-06-08 ENCOUNTER — Ambulatory Visit: Payer: Medicare Other | Admitting: Occupational Therapy

## 2015-06-08 DIAGNOSIS — R531 Weakness: Secondary | ICD-10-CM | POA: Diagnosis not present

## 2015-06-08 DIAGNOSIS — IMO0002 Reserved for concepts with insufficient information to code with codable children: Secondary | ICD-10-CM

## 2015-06-08 DIAGNOSIS — G8114 Spastic hemiplegia affecting left nondominant side: Secondary | ICD-10-CM

## 2015-06-08 DIAGNOSIS — R4189 Other symptoms and signs involving cognitive functions and awareness: Secondary | ICD-10-CM | POA: Diagnosis not present

## 2015-06-08 DIAGNOSIS — I69898 Other sequelae of other cerebrovascular disease: Secondary | ICD-10-CM | POA: Diagnosis not present

## 2015-06-08 DIAGNOSIS — M25512 Pain in left shoulder: Secondary | ICD-10-CM | POA: Diagnosis not present

## 2015-06-08 NOTE — Therapy (Signed)
[facility] [address] [place], [place], [address] Phone: [phone]   Fax:  [phone]  Occupational Therapy Treatment  Patient Details  Name: Mary Le MRN: [id number] Date of Birth: March 08, 1946 Referring Provider: Dr. Alysia Penna  Encounter Date: 06/08/2015      OT End of Session - 06/08/15 1629    Visit Number 4   Number of Visits 9   Date for OT Re-Evaluation 06/16/15   Authorization Type medicare - needs G code and KX modifier   Authorization Time Period 60 days - end of certification period is 02/25/15   Authorization - Visit Number 4   Authorization - Number of Visits 9   OT Start Time 1450   OT Stop Time 1530   OT Time Calculation (min) 40 min   Activity Tolerance Patient tolerated treatment well      Past Medical History  Diagnosis Date  . Hypertension   . Hypercholesteremia   . Back pain     arthritis  . CVA (cerebral infarction)   . Fall 11/2014    fell in bathroom, uses a cane  . Joint pain     back, history of   . Weakness     Past Surgical History  Procedure Laterality Date  . Appendectomy    . Tonsillectomy    . Hemorroidectomy    . Tubal ligation    . Radiology with anesthesia N/A 06/03/2014    Procedure: RADIOLOGY WITH ANESTHESIA;  Surgeon: [name], MD;  Location: [facility];  Service: Radiology;  Laterality: N/A;  . Tee without cardioversion N/A 06/07/2014    Procedure: TRANSESOPHAGEAL ECHOCARDIOGRAM (TEE);  Surgeon: [name], MD;  Location: [facility] ENDOSCOPY;  Service: Cardiovascular;  Laterality: N/A;  . Loop recorder implant  06/07/2014    MDT LINQ implanted by Dr [name] for cryptogenic stroke  . Loop recorder implant N/A 06/07/2014    Procedure: LOOP RECORDER IMPLANT;  Surgeon: [name], MD;  Location: [facility] CATH LAB;  Service: Cardiovascular;  Laterality: N/A;    There were no vitals filed for this visit.  Visit Diagnosis:  Spastic hemiplegia  affecting left nondominant side (HCC)  Weakness due to cerebrovascular accident  Pain in joint, shoulder region, left      Subjective Assessment - 06/08/15 1454    Subjective  My pain now feels more like nerve pain vs. muscle pain   Patient is accompained by: Family member   Pertinent History see epic snapshot;  HTN, fluctuating BP since stroke   Limitations LOOP RECORDER   Patient Stated Goals use of fingers and hand   Currently in Pain? Yes   Pain Score 5    Pain Location Arm   Pain Orientation Left   Pain Descriptors / Indicators Sharp;Pins and needles   Pain Type Acute pain   Pain Onset More than a month ago   Pain Frequency Intermittent   Aggravating Factors  early morning   Pain Relieving Factors stretches, massage                      OT Treatments/Exercises (OP) - 06/08/15 0001    ADLs   UB Dressing Reviewed hemi dressing techniques and letting gravity assist with donning sleeve over LUE   Shoulder Exercises: Seated   Other Seated Exercises Performed previously issued HEP including table slides, shoulder/elbow stretch into gravity and pt/daughter also shown "rock the baby" stretch for some shoulder abduction   Modalities  Modalities Education officer, environmental Stimulation Location dorsal forearm  already approved by MD with loop recorder - SEE REFERRAL   Electrical Stimulation Action wrist/finger extension   Electrical Stimulation Parameters 50 pps, 248 pw, 10 sec. on/off cycle, x 10 minutes    Electrical Stimulation Goals Neuromuscular facilitation                  OT Short Term Goals - 05/18/15 1543    OT SHORT TERM GOAL #1   Title none           OT Long Term Goals - 05/23/15 1253    OT LONG TERM GOAL #1   Title Pt/family to be independent with splint wear and care   Time 4   Period Weeks   Status Achieved   OT LONG TERM GOAL #2   Title Pt/family will be independent with updated HEP   Time  4   Period Weeks   Status Achieved   OT LONG TERM GOAL #3   Title Pt/family to verbalize understanding with hemi techniques and A/E use to increase independence with dressing   Status On-going               Plan - 06/08/15 1630    Clinical Impression Statement Pt responds to estim well and wants to pursue for home use.    Plan P/ROM and self ROM LUE, continue estim (try atrophy default setting to see if pt responds wiith some wrist and finger extension)   OT Home Exercise Plan supine LUE sidelying, and trunk rotation- issued 10/06/14, paddle splint use, weightbearing through tilted stool with LUE-02/07/15; added one exercise 05/23/2015   Consulted and Agree with Plan of Care Patient;Family member/caregiver   Family Member Consulted daughter        Problem List Patient Active Problem List   Diagnosis Date Noted  . Syncope 12/06/2014  . Spastic hemiplegia affecting nondominant side ([place]) 07/25/2014  . 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 Left hemiparesis ([place]) 06/10/2014  . Left-sided neglect 06/10/2014  . 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 Morbid obesity- BMI 40 06/06/2014  . Acute respiratory failure with hypoxia ([place]) 06/05/2014  . Cerebral infarction due to occlusion of right middle cerebral artery ([place]) 06/03/2014  . Cerebral infarct ([place]) 06/03/2014  . Altered mental status 06/03/2014  . GERD (gastroesophageal reflux disease) 04/12/2014  . Benign essential HTN 02/07/2014  . Dyslipidemia- statin intol 02/07/2014  . Chest pain- Low risk Myoview July 2015 02/06/2014    Carey Bullocks, OTR/L 06/08/2015, 4:34 PM  [facility] [address] [place], [place], [address] Phone: [phone]   Fax:  [phone]  Name: Mary Le MRN: [id number] Date of Birth: 10-Mar-1946

## 2015-06-09 ENCOUNTER — Ambulatory Visit (HOSPITAL_BASED_OUTPATIENT_CLINIC_OR_DEPARTMENT_OTHER): Payer: Medicare Other | Admitting: Physical Medicine & Rehabilitation

## 2015-06-09 ENCOUNTER — Encounter: Payer: Medicare Other | Attending: Physical Medicine & Rehabilitation

## 2015-06-09 ENCOUNTER — Encounter: Payer: Self-pay | Admitting: Physical Medicine & Rehabilitation

## 2015-06-09 VITALS — BP 135/65 | HR 55

## 2015-06-09 DIAGNOSIS — M7502 Adhesive capsulitis of left shoulder: Secondary | ICD-10-CM | POA: Insufficient documentation

## 2015-06-09 DIAGNOSIS — G8110 Spastic hemiplegia affecting unspecified side: Secondary | ICD-10-CM | POA: Insufficient documentation

## 2015-06-09 DIAGNOSIS — G8114 Spastic hemiplegia affecting left nondominant side: Secondary | ICD-10-CM

## 2015-06-09 DIAGNOSIS — I63311 Cerebral infarction due to thrombosis of right middle cerebral artery: Secondary | ICD-10-CM | POA: Diagnosis not present

## 2015-06-09 DIAGNOSIS — R414 Neurologic neglect syndrome: Secondary | ICD-10-CM | POA: Insufficient documentation

## 2015-06-09 NOTE — Progress Notes (Signed)
Botox Injection for spasticity using needle EMG guidance  Dilution: 50 Units/ml Indication: Severe spasticity which interferes with ADL,mobility and/or  hygiene and is unresponsive to medication management and other conservative care Informed consent was obtained after describing risks and benefits of the procedure with the patient. This includes bleeding, bruising, infection, excessive weakness, or medication side effects. A REMS form is on file and signed. Needle: 27g 1" needle electrode Number of units per muscle Pectoralis100 Brachioradialis50 Biceps 50 FDS50 FDP50 FPL50 Pronator 50 All injections were done after obtaining appropriate EMG activity and after negative drawback for blood. The patient tolerated the procedure well. Post procedure instructions were given. A followup appointment was made.

## 2015-06-09 NOTE — Patient Instructions (Signed)

## 2015-06-12 ENCOUNTER — Ambulatory Visit: Payer: Medicare Other | Admitting: Occupational Therapy

## 2015-06-13 ENCOUNTER — Telehealth: Payer: Self-pay | Admitting: Occupational Therapy

## 2015-06-13 ENCOUNTER — Ambulatory Visit: Payer: Medicare Other | Admitting: Occupational Therapy

## 2015-06-13 NOTE — Telephone Encounter (Signed)
Called patient and spoke with patient's daughter re: pt's follow up therapy. Discussed use of estim for home, however EMPI devices are no longer in service and therefore, pt unable to have insurance cover device. Pt's daughter still wishes to order one on her own and pay out of pocket. Therapist did make recommendations on parameters and what type of device to purchase. Once estim unit has been purchased for home, pt/family have agreed to return for 1 week of therapy (prior to 07/15/15) to properly train in device and how to apply correctly. We will place pt on hold until further notice due to no other therapy needs at this time; all other LTG's have been addressed. Pt's daughter agreed to plan of care.

## 2015-06-19 ENCOUNTER — Encounter: Payer: Medicare Other | Admitting: Occupational Therapy

## 2015-06-20 ENCOUNTER — Other Ambulatory Visit: Payer: Self-pay

## 2015-06-20 ENCOUNTER — Encounter: Payer: Medicare Other | Admitting: Occupational Therapy

## 2015-06-20 DIAGNOSIS — Z1231 Encounter for screening mammogram for malignant neoplasm of breast: Secondary | ICD-10-CM

## 2015-06-22 ENCOUNTER — Ambulatory Visit (HOSPITAL_COMMUNITY): Payer: Medicare Other

## 2015-06-22 ENCOUNTER — Ambulatory Visit (HOSPITAL_COMMUNITY)
Admission: RE | Admit: 2015-06-22 | Discharge: 2015-06-22 | Disposition: A | Discharge status: Home / Self Care | Payer: Medicare Other | Source: Ambulatory Visit | Attending: Interventional Radiology | Admitting: Interventional Radiology

## 2015-06-22 DIAGNOSIS — I639 Cerebral infarction, unspecified: Secondary | ICD-10-CM | POA: Diagnosis not present

## 2015-06-22 DIAGNOSIS — I672 Cerebral atherosclerosis: Secondary | ICD-10-CM | POA: Insufficient documentation

## 2015-06-22 DIAGNOSIS — R531 Weakness: Secondary | ICD-10-CM | POA: Diagnosis not present

## 2015-06-22 LAB — CREATININE, SERUM
CREATININE: 0.73 mg/dL (ref 0.44–1.00)
GFR calc Af Amer: >60 mL/min (ref >60)

## 2015-06-22 MED ORDER — GADOBENATE DIMEGLUMINE 529 MG/ML IV SOLN
17.0000 mL | Freq: Once | INTRAVENOUS | Status: AC | PRN
  Administered 2015-06-22: 17 mL via INTRAVENOUS

## 2015-06-22 MED ORDER — DIAZEPAM 5 MG PO TABS
ORAL_TABLET | ORAL | Status: AC
  Administered 2015-06-22: 5 mg via ORAL
  Filled 2015-06-22: qty 1

## 2015-06-22 MED ORDER — DIAZEPAM 5 MG PO TABS
5.0000 mg | ORAL_TABLET | Freq: Once | ORAL | Status: AC
  Administered 2015-06-22: 5 mg via ORAL
  Filled 2015-06-22: qty 1

## 2015-06-26 ENCOUNTER — Encounter: Payer: Medicare Other | Admitting: Occupational Therapy

## 2015-06-26 ENCOUNTER — Encounter: Payer: Self-pay | Admitting: Neurology

## 2015-06-26 ENCOUNTER — Ambulatory Visit (INDEPENDENT_AMBULATORY_CARE_PROVIDER_SITE_OTHER): Payer: Medicare Other | Admitting: Neurology

## 2015-06-26 VITALS — BP 125/60 | HR 72 | Ht 63.0 in | Wt 196.0 lb

## 2015-06-26 DIAGNOSIS — I63031 Cerebral infarction due to thrombosis of right carotid artery: Secondary | ICD-10-CM

## 2015-06-26 DIAGNOSIS — I639 Cerebral infarction, unspecified: Secondary | ICD-10-CM | POA: Diagnosis not present

## 2015-06-26 MED ORDER — CLOPIDOGREL BISULFATE 75 MG PO TABS: 75.0000 mg | ORAL_TABLET | Freq: Every day | ORAL | Status: DC

## 2015-06-26 NOTE — Progress Notes (Signed)
[facility] [address] [place]. [place] [address] [phone]       OFFICE FOLLOW-UP NOTE  [name] Date of Birth:  1946-03-11 Medical Record Number:  [id number]   HPI: 66 year African American ladywho awakened normal on 06/03/14. Went to have an MRI and returned normal. She did complain of not feeling well and laid down for a nap. When her family went to awaken her they noted a left facial droop and left sided weakness. EMS was called at that time and the patient was brought in as a code stroke. Initial NIHSS of 18.presenting with left hemiplegia, left neglect and HH, right gaze. NIH stroke scale on admission was 18. She did not receive IV t-PA due to out of window. CTA showed right M1 cut off and Dr Estanislado Pandy neuroradiologist in IR performed ENDOVASCULAR COMPLETE REVASCULARIZATION OF OCCLUDED RIGHT MIDDLE CEREBRAL ARTERY DOMINANT INFERIOR DIVISION USING SUPERSELECTIVE INTRA-ARTERIAL INTRACRANIAL INTEGRELIN AND 2 PASSES WITH THE SOLITAIRE FR Fuig was monitored in intensive care unit and pulmonary critical care followed her initially as primary attending. She was subsequently extubated and found to falling commands though left upper extremity weakness and plegia persisted. Left lower extremity strength improved to 4/5. Patient's prep was tightly controlled. Postprocedure brain imaging did not reveal significant hemorrhagic transformation. Patient notes was transferred to the neurology floor where she made steady progress. She was seen by physical occupational and speech therapy. Transthoracic echo showed no significant cardiac source of embolism. TEE also showed no cardiac source of embolism. Patient had LEt venous Doppler which was negative for DVT. She had loop recorder placed to look for paroxysmal atrial fibrillation. She was felt to be a good candidate for inpatient rehabilitation and hence was transferred there in a stable condition on  06/08/14. Her neurological deficits on day of discharge included mild left facial droop but no dysarthria. Left upper extremity was plegic 0/5. There was very mild weakness of the left lower extremity without any drift. She was started on aspirin. She has subsequently been switched to Plavix for GI upset . She has made steady progress with outpatient physical and occupational therapy and is now able to move the left arm above the shoulder but has no useful function in the left fingers. She has been getting Botox injections by Dr. Barbaraann Cao which seems to have helped her pain and spasticity. She however still needs a lot of help from her daughter for bathing, using the bathroom, cocaine and changing her clothes. She is able to ambulate with a cane and has had no recent falls. Her speech and swallowing have improved. She has not had any recurrent stroke or TIA symptoms Update 12/15/2014 : She returns for follow-up after last visit 4 months ago. She is accompanied by her daughter. Patient had a fall in the bathroom a month ago and needed several stitches. She states she slipped in the bathroom. She is walking with a cane and does reasonably well most of the time. She still has pain and spasticity in the left shoulder and arm for which she gets Botox injections by Dr. Dianna Limbo which seem to help her. She remains on Plavix which is tolerating well without bleeding or bruising. She states her blood pressure is well controlled though it is slightly elevated today at 150/88 in office. She has had no recurrent stroke or TIA symptoms but feels that after her fall her gait and balance is slightly off and on not to back to baseline. She would  like to be referred back to physical therapy for gait and balance training. She has not had any recent lipid profile checked but remains on fish oil. Update 06/26/2015 : She returns for follow-up after last visit with me 6 months ago. She is accompanied by her daughter. Patient continues  to do well and has not had any recurrent stroke or TIA symptoms. However she did not undergo lab work or carotid dopplers that I had ordered at last visit and is not clear as to why. She however underwent an MRI scan of the brain done on 06/22/15 ordered by Dr. Luanne Bras which I personally reviewed shows encephalomalacia in the right MCA territory but no acute infarct. MRA of the brain shows moderate right middle cerebral artery restenosis in the M1 segment. Patient remains on aspirin and Plavix just she is tolerating well without significant bleeding and only minor bruising. She is had fortunately no falls or serious injuries. She continues to see Dr. Kayren Eaves for Botox into her left upper extremity which seems to be helping her. She is living at home with her daughter and needs only help with activities like taking a shower or dressing herself. She is able to walk independently without cane with a fairly steady gait and has not had any major falls. ROS:   14 system review of systems is positive for  constipation, itching, depression and all the systems negative. PMH:  Past Medical History  Diagnosis Date  . Hypertension   . Hypercholesteremia   . Back pain     arthritis  . CVA (cerebral infarction)   . Fall 11/2014    fell in bathroom, uses a cane  . Joint pain     back, history of   . Weakness   . Stroke [facility])     Social History:  Social History   Social History  . Marital Status: Married    Spouse Name: N/A  . Number of Children: N/A  . Years of Education: N/A   Occupational History  . Not on file.   Social History Main Topics  . Smoking status: Former Smoker -- 5 years  . Smokeless tobacco: Never Used  . Alcohol Use: 0.0 oz/week    0 Standard drinks or equivalent per week     Comment: occasionally wine  . Drug Use: No  . Sexual Activity: Not on file   Other Topics Concern  . Not on file   Social History Narrative   Lives in [place]    Married. Education:  [employer].     Medications:   Current Outpatient Prescriptions on File Prior to Visit  Medication Sig Dispense Refill  . acetaminophen (TYLENOL) 325 MG tablet Take 650 mg by mouth every 6 (six) hours as needed (pain).     . clotrimazole (LOTRIMIN) 1 % cream Apply 1 application topically 2 (two) times daily. 60 g 1  . famotidine (PEPCID) 10 MG tablet Take 1 tablet (10 mg total) by mouth 2 (two) times daily. (Patient taking differently: Take 10 mg by mouth daily as needed. ) 60 tablet 1  . loratadine (ALAVERT) 10 MG tablet Take 10 mg by mouth daily as needed for allergies.    . metoprolol succinate (TOPROL-XL) 25 MG 24 hr tablet TAKE 1/2 TABLET EVERY DAY 45 tablet 1  . Multiple Vitamin (MULTIVITAMIN WITH MINERALS) TABS tablet Take 1 tablet by mouth daily.    Vladimir Faster Glycol-Propyl Glycol (SYSTANE OP) Place 1 drop into both eyes daily as needed (dry eyeys).    Marland Kitchen  senna-docusate (SENOKOT-S) 8.6-50 MG per tablet Take 3 tablets by mouth 2 (two) times daily. (Patient taking differently: Take 2 tablets by mouth 2 (two) times daily. ) 100 tablet 1   No current facility-administered medications on file prior to visit.    Allergies:   Allergies  Allergen Reactions  . Tizanidine Itching  . Aspirin Nausea And Vomiting    Tolerated baby aspirin, but with more than once per day of full strength for aches and pains - had stomach upset.  No history of PUD/gastric bleeding known.   . Statins Other (See Comments)    Myalgias and chest pain (has tried Lipitor and Pravachol)    Physical Exam General: well developed, well nourished middle aged African-American lady, seated, in no evident distress Head: head normocephalic and atraumatic.  Neck: supple with no carotid or supraclavicular bruits Cardiovascular: regular rate and rhythm, no murmurs Musculoskeletal: no deformity Skin:  no rash/petichiae Vascular:  Normal pulses all extremities Filed Vitals:   06/26/15 1123  BP: 125/60  Pulse: 72    Neurologic Exam Mental Status: Awake and fully alert. Oriented to place and time. Recent and remote memory intact. Attention span, concentration and fund of knowledge appropriate. Mood and affect appropriate.  Cranial Nerves: Fundoscopic exam  not done.   Pupils equal, briskly reactive to light. Extraocular movements full without nystagmus. Visual fields full to confrontation. Hearing intact. Facial sensation intact. Mild left lower facial weakness. Tongue, palate moves normally and symmetrically.  Motor: Spastic left hemiparesis with 3/5 proximal left approximately and 0/5 distal left upper extremity strength. 4+/5 left lower extremity strength with mild weakness of ankle dorsiflexors and hip flexors. Increased tone on the left with spasticity at the left shoulder and elbow extensors. Sensory.: intact to touch ,pinprick .position and vibratory sensation.  Coordination: Impaired on the left due to weakness and normal on the right  Gait and Station:Spastic hemiplegic gait with circumduction of the left foot. DTRs: 2+ and asymmetric brisker on the left. Toes downgoing.   NIHSS  4 Modified Rankin  3   ASSESSMENT: 51 year African-American lady with embolic right middle cerebral artery cerebral infarction due to occlusion of right middle cerebral artery of embolic etiology without definite identified source s/p endovascular revascularization of an occluded right middle cerebral artery and dominant inferior division using superselective intra-arterial intracranial Integrilin and 2 passes of solitaire Fr stent retrieval device in November 2015   Patient has made a modest recovery with residual spastic left hemiplegia. Vascular risk factors of hypertension and hyperlipidemia.    PLAN: I had a long d/w patient and her daughter about her remote stroke, risk for recurrent stroke/TIAs, personally independently reviewed imaging studies and stroke evaluation results and answered questions.Continue aspirin  81 mg daily and clopidogrel 75 mg daily  for secondary stroke prevention given intracranial  Rt MCA restenosis and maintain strict control of hypertension with blood pressure goal below 130/90, diabetes with hemoglobin A1c goal below 6.5% and lipids with LDL cholesterol goal below 70 mg/dL. I also advised the patient to eat a healthy diet with plenty of whole grains, cereals, fruits and vegetables, exercise regularly and maintain ideal body weight .check transcranial Doppler studies and carotid ultrasound as well as fasting lipid profile and hemoglobin A1c. Continue ongoing follow-up with Dr. Letta Pate for Botox for left shoulder spasticity. She was given a refill for Plavix. Greater than 50% time during this 30 minute visit was spent on counseling and coordination of care. Followup in the future with me in  6 months or call earlier if necessary.     Antony Contras, MD Note: This document was prepared with digital dictation and possible smart phrase technology. Any transcriptional errors that result from this process are unintentional

## 2015-06-26 NOTE — Patient Instructions (Addendum)
I had a long d/w patient and her daughter about her remote stroke, risk for recurrent stroke/TIAs, personally independently reviewed imaging studies and stroke evaluation results and answered questions.Continue aspirin 81 mg daily and clopidogrel 75 mg daily  for secondary stroke prevention given intracranial  Rt MCA restenosis and maintain strict control of hypertension with blood pressure goal below 130/90, diabetes with hemoglobin A1c goal below 6.5% and lipids with LDL cholesterol goal below 70 mg/dL. I also advised the patient to eat a healthy diet with plenty of whole grains, cereals, fruits and vegetables, exercise regularly and maintain ideal body weight .check transcranial Doppler studies and carotid ultrasound as well as fasting lipid profile and hemoglobin A1c. Continue ongoing follow-up with Dr. Letta Pate for Botox for left shoulder spasticity. She was given a refill for Plavix. Followup in the future with me in  6 months or call earlier if necessary.

## 2015-06-28 ENCOUNTER — Other Ambulatory Visit: Payer: Self-pay

## 2015-06-28 ENCOUNTER — Other Ambulatory Visit (INDEPENDENT_AMBULATORY_CARE_PROVIDER_SITE_OTHER): Payer: Self-pay

## 2015-06-28 DIAGNOSIS — Z79899 Other long term (current) drug therapy: Secondary | ICD-10-CM | POA: Diagnosis not present

## 2015-06-28 DIAGNOSIS — I639 Cerebral infarction, unspecified: Secondary | ICD-10-CM

## 2015-06-28 DIAGNOSIS — M7989 Other specified soft tissue disorders: Secondary | ICD-10-CM

## 2015-06-28 DIAGNOSIS — Z0289 Encounter for other administrative examinations: Secondary | ICD-10-CM

## 2015-06-29 ENCOUNTER — Other Ambulatory Visit: Payer: Self-pay | Admitting: Neurology

## 2015-06-29 ENCOUNTER — Other Ambulatory Visit (HOSPITAL_COMMUNITY): Payer: Self-pay | Admitting: Interventional Radiology

## 2015-06-29 ENCOUNTER — Telehealth: Payer: Self-pay

## 2015-06-29 DIAGNOSIS — I639 Cerebral infarction, unspecified: Secondary | ICD-10-CM

## 2015-06-29 LAB — LIPID PANEL
CHOL/HDL RATIO: 5.4 ratio — AB (ref 0.0–4.4)
Cholesterol, Total: 275 mg/dL — ABNORMAL HIGH (ref 100–199)
HDL: 51 mg/dL (ref >39)
LDL Calculated: 204 mg/dL — ABNORMAL HIGH (ref 0–99)
Triglycerides: 101 mg/dL (ref 0–149)
VLDL CHOLESTEROL CAL: 20 mg/dL (ref 5–40)

## 2015-06-29 LAB — HEMOGLOBIN A1C
Est. average glucose Bld gHb Est-mCnc: 123 mg/dL
HEMOGLOBIN A1C: 5.9 % — AB (ref 4.8–5.6)

## 2015-06-29 MED ORDER — ROSUVASTATIN CALCIUM 20 MG PO TABS: 20.0000 mg | ORAL_TABLET | Freq: Every day | ORAL | Status: DC

## 2015-06-29 NOTE — Telephone Encounter (Signed)
LFt vm for patient about lab work and medication prescribed for it. Pt cannot take lipitor. Dr.Sethi stated to tell patient that he prescribed crestor and it was sent to the pharmacy.

## 2015-06-29 NOTE — Telephone Encounter (Signed)
-----   Message from Garvin Fila, MD sent at 06/29/2015  8:37 AM EST ----- Kindly call the patient and let her know that her total and bad cholesterol are both significantly elevated and she needs to take Lipitor 40 mg daily to reduce her cholesterol and stroke risk

## 2015-06-29 NOTE — Telephone Encounter (Signed)
Rn receiving incoming call from [place] patients daughter on [place] list. Rn stated her moms lipid panel levels were elevated. Rn stated Dr.Sethi stated he put the patient on crestor 20mg . PT stated her mom cant take Lipitor it makes her nausea and sick, and she has major side effects from. Rn stated Dr.Sethi would like for patient to try crestor. RN stated if patient has any side effects from the crestor to notify us. Pts daughter verbalized understanding.

## 2015-06-30 LAB — CUP PACEART REMOTE DEVICE CHECK: MDC IDC SESS DTM: 20161111190635

## 2015-06-30 NOTE — Progress Notes (Signed)
[facility] summary report received. Battery status OK. Normal device function. No new symptom episodes, tachy episodes, brady, or pause episodes. No new AF episodes. Monthly summary reports and ROV with JA PRN. 

## 2015-07-03 ENCOUNTER — Ambulatory Visit (INDEPENDENT_AMBULATORY_CARE_PROVIDER_SITE_OTHER): Payer: Medicare Other | Admitting: *Deleted

## 2015-07-03 ENCOUNTER — Ambulatory Visit (HOSPITAL_COMMUNITY)
Admission: RE | Admit: 2015-07-03 | Discharge: 2015-07-03 | Disposition: A | Discharge status: Home / Self Care | Payer: Medicare Other | Source: Ambulatory Visit | Attending: Neurology | Admitting: Neurology

## 2015-07-03 DIAGNOSIS — I63411 Cerebral infarction due to embolism of right middle cerebral artery: Secondary | ICD-10-CM | POA: Diagnosis not present

## 2015-07-03 DIAGNOSIS — I63031 Cerebral infarction due to thrombosis of right carotid artery: Secondary | ICD-10-CM | POA: Diagnosis not present

## 2015-07-03 NOTE — Progress Notes (Signed)
*  PRELIMINARY RESULTS* Vascular Ultrasound Carotid Duplex (Doppler) has been completed.  Preliminary findings: Bilateral: No significant (1-39%) ICA stenosis. Antegrade vertebral flow.    Landry Mellow, RDMS, RVT  07/03/2015, 10:19 AM

## 2015-07-03 NOTE — Progress Notes (Signed)
[facility] Summary Report / Loop Recorder 

## 2015-07-04 ENCOUNTER — Encounter: Payer: Self-pay | Admitting: Occupational Therapy

## 2015-07-04 ENCOUNTER — Ambulatory Visit: Payer: Medicare Other | Attending: Physical Medicine & Rehabilitation | Admitting: Occupational Therapy

## 2015-07-04 DIAGNOSIS — G8114 Spastic hemiplegia affecting left nondominant side: Secondary | ICD-10-CM | POA: Diagnosis not present

## 2015-07-04 NOTE — Therapy (Signed)
[facility] [address] [place], [place], [address] Phone: [phone]   Fax:  [phone]  Occupational Therapy Treatment  Patient Details  Name: Mary Le MRN: [id number] Date of Birth: 1945-12-12 Referring Provider: Dr. Alysia Penna  Encounter Date: 07/04/2015      OT End of Session - 07/04/15 1335    Visit Number 5   Number of Visits 9   Date for OT Re-Evaluation 06/16/15   Authorization Type medicare - needs G code and KX modifier   Authorization Time Period 60 days - end of certification period is 02/25/15   Authorization - Visit Number 5   Authorization - Number of Visits 9   OT Start Time 1100   OT Stop Time 1145   OT Time Calculation (min) 45 min   Activity Tolerance Patient tolerated treatment well      Past Medical History  Diagnosis Date  . Hypertension   . Hypercholesteremia   . Back pain     arthritis  . CVA (cerebral infarction)   . Fall 11/2014    fell in bathroom, uses a cane  . Joint pain     back, history of   . Weakness   . Stroke [facility])     Past Surgical History  Procedure Laterality Date  . Appendectomy    . Tonsillectomy    . Hemorroidectomy    . Tubal ligation    . Radiology with anesthesia N/A 06/03/2014    Procedure: RADIOLOGY WITH ANESTHESIA;  Surgeon: [name], MD;  Location: [facility];  Service: Radiology;  Laterality: N/A;  . Tee without cardioversion N/A 06/07/2014    Procedure: TRANSESOPHAGEAL ECHOCARDIOGRAM (TEE);  Surgeon: [name], MD;  Location: [facility] ENDOSCOPY;  Service: Cardiovascular;  Laterality: N/A;  . Loop recorder implant  06/07/2014    MDT LINQ implanted by Dr [name] for cryptogenic stroke  . Loop recorder implant N/A 06/07/2014    Procedure: LOOP RECORDER IMPLANT;  Surgeon: [name], MD;  Location: [facility] CATH LAB;  Service: Cardiovascular;  Laterality: N/A;    There were no vitals filed for this visit.  Visit Diagnosis:  Spastic  hemiplegia affecting left nondominant side (HCC)      Subjective Assessment - 07/04/15 1119    Subjective  Per pt and daughter, they feel comfortable with using NMES home device at home now   Patient is accompained by: Family member   Pertinent History see epic snapshot;  HTN, fluctuating BP since stroke   Limitations LOOP RECORDER   Patient Stated Goals use of fingers and hand   Currently in Pain? No/denies                      OT Treatments/Exercises (OP) - 07/04/15 0001    ADLs   ADL Comments Pt/daughter education in how to properly set up and use home estim unit for wrist and finger extension at home. (see pt instructions for details). Reviewed proper placement of electrodes, skin care, safety considerations, precautions, and settings. Pt/daughter also instructed never to use if pt should ever get pacemaker or defibrillator in the future. Pt/daughter agreed. See pt instructions for other precautions   Electrical Stimulation   Electrical Stimulation Location dorsal forearm  approved by MD with loop recorder - see referral   Electrical Stimulation Action wrist/finger extension   Electrical Stimulation Parameters 50 pps, 248 pw, 10 sec. on/off cycle x 15 minutes   Electrical Stimulation Goals Neuromuscular facilitation  OT Education - 2015/08/01 1144    Education provided Yes   Education Details Home estim unit - how to apply, precautions, setting; review of previously issued HEP (Self ROM)   Person(s) Educated Patient;Child(ren)   Methods Explanation;Demonstration;Handout   Comprehension Verbalized understanding;Returned demonstration          OT Short Term Goals - 05/18/15 1543    OT SHORT TERM GOAL #1   Title none           OT Long Term Goals - 08/01/2015 1336    OT LONG TERM GOAL #1   Title Pt/family to be independent with splint wear and care   Time 4   Period Weeks   Status Achieved   OT LONG TERM GOAL #2   Title Pt/family  will be independent with updated HEP   Time 4   Period Weeks   Status Achieved   OT LONG TERM GOAL #3   Title Pt/family to verbalize understanding with hemi techniques and A/E use to increase independence with dressing   Status Achieved               Plan - 01-Aug-2015 1337    Clinical Impression Statement Pt met all 3 LTG's and now has home unit NMES device for wrist and finger extension   Plan d/c O.T.    OT Home Exercise Plan supine LUE sidelying, and trunk rotation- issued 10/06/14, paddle splint use, weightbearing through tilted stool with LUE-02/07/15; added one exercise 05/23/2015   Consulted and Agree with Plan of Care Patient;Family member/caregiver   Family Member Consulted daughter          G-Codes - August 01, 2015 1341    Functional Assessment Tool Used clinical judgement    Functional Limitation Changing and maintaining body position   Changing and Maintaining Body Position Goal Status [phone]) At least 20 percent but less than 40 percent impaired, limited or restricted   Changing and Maintaining Body Position Discharge Status [phone]) At least 20 percent but less than 40 percent impaired, limited or restricted      Problem List Patient Active Problem List   Diagnosis Date Noted  . Syncope 12/06/2014  . Spastic hemiplegia affecting nondominant side ([place]) 07/25/2014  . 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 Left hemiparesis ([place]) 06/10/2014  . Left-sided neglect 06/10/2014  . Thrombotic stroke involving right middle cerebral artery ([place]) 06/08/2014  . Morbid obesity- BMI 40 06/06/2014  . Acute respiratory failure with hypoxia ([place]) 06/05/2014  . Cerebral infarction due to occlusion of right middle cerebral artery ([place]) 06/03/2014  . Cerebral infarct ([place]) 06/03/2014  . Altered mental status 06/03/2014  . GERD (gastroesophageal reflux disease) 04/12/2014  . Benign essential HTN 02/07/2014  . Dyslipidemia- statin intol 02/07/2014   . Chest pain- Low risk Myoview July 2015 02/06/2014   OCCUPATIONAL THERAPY DISCHARGE SUMMARY  Visits from Start of Care: 5  Current functional level related to goals / functional outcomes: SEE ABOVE   Remaining deficits: SPASTICITY DENSE HEMIPLEGIA   Education / Equipment: NMES home device training, HEP, splint wear and care  Plan: Patient agrees to discharge.  Patient goals were met. Patient is being discharged due to meeting the stated rehab goals.  ?????       Carey Bullocks, OTR/L August 01, 2015, 1:42 PM  [facility] [address] [place], [place], [address] Phone: [phone]   Fax:  [phone]  Name: Mary Le MRN: [id number] Date of Birth: Jul 17, 1946

## 2015-07-04 NOTE — Patient Instructions (Signed)
ESTIM INSTRUCTIONS:   Okay to do 3x/day for 15 minutes each session, but spread the sessions out at least 2 hours apart Clean area of skin with rubbing alcohol prior to placing electrodes Place 2 electrodes as instructed by therapist Only use one channel to plug into device and electrodes  Setting:   MODE: Synchronous SET  RAMP: 2-3 SEC SET ON TIME: 10 SEC SET  OFF TIME: 10 SEC SET  RATE: 50 hz SET WIDTH: 250 [place] SET  TIME: 15 mintues (total time) SET  Then gradually increase intensity to desired effect  **NEVER TURN UP INTENSITY DURING "OFF" CYCLE, ONLY DURING "ON" CYCLE ** NEVER CHANGE ELECTRODE PLACEMENT OR TAKE OFF ELECTRODES WITH DEVICE ON/RUNNING.

## 2015-07-06 ENCOUNTER — Telehealth: Payer: Self-pay

## 2015-07-06 NOTE — Telephone Encounter (Signed)
Rn call patient about her carotid ultrasound study results.Pts daughter Mary Le who is on the [facility] form stated that pt was not available. Rn notified Mary Le that her moms carotid ultrasound study showed no major blockages in the neck. Pts daughter will tell her mom and she verify understanding of the test results.

## 2015-07-06 NOTE — Telephone Encounter (Signed)
-----   Message from Garvin Fila, MD sent at 07/06/2015  1:01 PM EST ----- Kindly inform the patient that carotid ultrasound study showed no significant blockages in the neck to worry about

## 2015-07-07 ENCOUNTER — Ambulatory Visit
Admission: RE | Admit: 2015-07-07 | Discharge: 2015-07-07 | Disposition: A | Discharge status: Home / Self Care | Payer: Medicare Other | Source: Ambulatory Visit

## 2015-07-07 DIAGNOSIS — Z1231 Encounter for screening mammogram for malignant neoplasm of breast: Secondary | ICD-10-CM

## 2015-07-12 ENCOUNTER — Other Ambulatory Visit (HOSPITAL_COMMUNITY): Payer: Self-pay | Admitting: Interventional Radiology

## 2015-07-12 ENCOUNTER — Ambulatory Visit (HOSPITAL_COMMUNITY)
Admission: RE | Admit: 2015-07-12 | Discharge: 2015-07-12 | Disposition: A | Discharge status: Home / Self Care | Payer: Medicare Other | Source: Ambulatory Visit | Attending: Interventional Radiology | Admitting: Interventional Radiology

## 2015-07-12 DIAGNOSIS — I638 Other cerebral infarction: Secondary | ICD-10-CM | POA: Diagnosis not present

## 2015-07-12 DIAGNOSIS — I639 Cerebral infarction, unspecified: Secondary | ICD-10-CM

## 2015-07-20 ENCOUNTER — Telehealth: Payer: Self-pay

## 2015-07-21 ENCOUNTER — Ambulatory Visit: Payer: Medicare Other | Admitting: Physical Medicine & Rehabilitation

## 2015-07-24 ENCOUNTER — Ambulatory Visit (INDEPENDENT_AMBULATORY_CARE_PROVIDER_SITE_OTHER): Payer: Medicare Other | Admitting: Family Medicine

## 2015-07-24 ENCOUNTER — Encounter: Payer: Self-pay | Admitting: Family Medicine

## 2015-07-24 VITALS — BP 130/92 | HR 64 | Temp 97.8°F | Resp 18 | Ht 63.0 in | Wt 192.0 lb

## 2015-07-24 DIAGNOSIS — K5900 Constipation, unspecified: Secondary | ICD-10-CM

## 2015-07-24 DIAGNOSIS — I10 Essential (primary) hypertension: Secondary | ICD-10-CM

## 2015-07-24 DIAGNOSIS — E669 Obesity, unspecified: Secondary | ICD-10-CM

## 2015-07-24 DIAGNOSIS — Z23 Encounter for immunization: Secondary | ICD-10-CM | POA: Diagnosis not present

## 2015-07-24 DIAGNOSIS — E66811 Obesity, class 1: Secondary | ICD-10-CM

## 2015-07-24 NOTE — Patient Instructions (Addendum)
Take 2 doses of miralax a day, if you have not had a bowel movement in several days, try to take 2-3 dulcolax followed by 3-4 doses of miralax (one every hour) Some high fiber cereals include Fiber one, Kashi Eat lost of vegetables and drink 6-8 glasses of water daily  Constipation, Adult Constipation is when a person has fewer than three bowel movements a week, has difficulty having a bowel movement, or has stools that are dry, hard, or larger than normal. As people grow older, constipation is more common. A low-fiber diet, not taking in enough fluids, and taking certain medicines may make constipation worse.  CAUSES   Certain medicines, such as antidepressants, pain medicine, iron supplements, antacids, and water pills.   Certain diseases, such as diabetes, irritable bowel syndrome (IBS), thyroid disease, or depression.   Not drinking enough water.   Not eating enough fiber-rich foods.   Stress or travel.   Lack of physical activity or exercise.   Ignoring the urge to have a bowel movement.   Using laxatives too much.  SIGNS AND SYMPTOMS   Having fewer than three bowel movements a week.   Straining to have a bowel movement.   Having stools that are hard, dry, or larger than normal.   Feeling full or bloated.   Pain in the lower abdomen.   Not feeling relief after having a bowel movement.  DIAGNOSIS  Your health care provider will take a medical history and perform a physical exam. Further testing may be done for severe constipation. Some tests may include:  A barium enema X-ray to examine your rectum, colon, and, sometimes, your small intestine.   A sigmoidoscopy to examine your lower colon.   A colonoscopy to examine your entire colon. TREATMENT  Treatment will depend on the severity of your constipation and what is causing it. Some dietary treatments include drinking more fluids and eating more fiber-rich foods. Lifestyle treatments may include regular  exercise. If these diet and lifestyle recommendations do not help, your health care provider may recommend taking over-the-counter laxative medicines to help you have bowel movements. Prescription medicines may be prescribed if over-the-counter medicines do not work.  HOME CARE INSTRUCTIONS   Eat foods that have a lot of fiber, such as fruits, vegetables, whole grains, and beans.  Limit foods high in fat and processed sugars, such as french fries, hamburgers, cookies, candies, and soda.   A fiber supplement may be added to your diet if you cannot get enough fiber from foods.   Drink enough fluids to keep your urine clear or pale yellow.   Exercise regularly or as directed by your health care provider.   Go to the restroom when you have the urge to go. Do not hold it.   Only take over-the-counter or prescription medicines as directed by your health care provider. Do not take other medicines for constipation without talking to your health care provider first.  Chicken IF:   You have bright red blood in your stool.   Your constipation lasts for more than 4 days or gets worse.   You have abdominal or rectal pain.   You have thin, pencil-like stools.   You have unexplained weight loss. MAKE SURE YOU:   Understand these instructions.  Will watch your condition.  Will get help right away if you are not doing well or get worse.   This information is not intended to replace advice given to you by your health care provider.  Make sure you discuss any questions you have with your health care provider.   Document Released: 04/05/2004 Document Revised: 07/29/2014 Document Reviewed: 04/19/2013 Elsevier Interactive Patient Education [employer].

## 2015-07-24 NOTE — Progress Notes (Signed)
Subjective:    Patient ID: Mary Le, female    DOB: 1946-05-26, 70 y.o.   MRN: [id number]  HPI This is a pleasant 70 yo female who presents today for follow up of HTN, cerebral infarct (06/03/14). She is followed by neurology, cardiology and physical medicine rehabilitation. A family friend is accompanying her. She likes to do puzzles and watch TV. She lives with her husband and her daughter and her family.   She had a fall a couple of days ago, she fell on the stairs and landed on her left arm and left leg. Iced/heat to area with relief. Pain mostly resolved, no bruising, no bleeding, no swelling, no increased weakness or limited mobility.  Has noticed more constipation with starting crestor. Has been taking Senekot daily and miralax every other day. She drinks 4-5 glasses of water daily. No abdominal pain, no dark stools or blood in stools.   Her husband is currently at the ED for work up of possible stroke- he started having symptoms this morning. She reports feeling very nervous and anxious about this. Prior to today, her mood and sleep have been good.   Past Medical History  Diagnosis Date  . Hypertension   . Hypercholesteremia   . Back pain     arthritis  . CVA (cerebral infarction)   . Fall 11/2014    fell in bathroom, uses a cane  . Joint pain     back, history of   . Weakness   . Stroke [facility])    Past Surgical History  Procedure Laterality Date  . Appendectomy    . Tonsillectomy    . Hemorroidectomy    . Tubal ligation    . Radiology with anesthesia N/A 06/03/2014    Procedure: RADIOLOGY WITH ANESTHESIA;  Surgeon: [name], MD;  Location: [place];  Service: Radiology;  Laterality: N/A;  . Tee without cardioversion N/A 06/07/2014    Procedure: TRANSESOPHAGEAL ECHOCARDIOGRAM (TEE);  Surgeon: [name], MD;  Location: [facility] ENDOSCOPY;  Service: Cardiovascular;  Laterality: N/A;  . Loop recorder implant  06/07/2014    MDT LINQ implanted by Dr [name] for  cryptogenic stroke  . Loop recorder implant N/A 06/07/2014    Procedure: LOOP RECORDER IMPLANT;  Surgeon: [name], MD;  Location: [place] CATH LAB;  Service: Cardiovascular;  Laterality: N/A;   Family History  Problem Relation Age of Onset  . Coronary artery disease Mother 89    MI  . Kidney disease Father    Social History  Substance Use Topics  . Smoking status: Former Smoker -- 5 years  . Smokeless tobacco: Never Used  . Alcohol Use: 0.0 oz/week    0 Standard drinks or equivalent per week     Comment: occasionally wine     Review of Systems Per HPI    Objective:   Physical Exam  Constitutional: She is oriented to person, place, and time. She appears well-developed and well-nourished. No distress.  HENT:  Head: Normocephalic and atraumatic.  Eyes: Conjunctivae are normal.  Neck: Normal range of motion. Neck supple.  Cardiovascular: Normal rate, regular rhythm and normal heart sounds.   Pulmonary/Chest: Effort normal and breath sounds normal.  Abdominal: Soft. Bowel sounds are normal. She exhibits no distension. There is no tenderness. There is no rebound and no guarding.  Musculoskeletal:  Left arm without redness, swelling or bruising. She has chronic spasticity and mild hand swelling. No pain with palpation of shoulder or elbow.   Neurological: She  is alert and oriented to person, place, and time.  Skin: Skin is warm and dry. She is not diaphoretic.  Psychiatric: She has a normal mood and affect. Her behavior is normal. Judgment and thought content normal.  Vitals reviewed.   BP 160/100 mmHg  Pulse 64  Temp(Src) 97.8 F (36.6 C) (Oral)  Resp 18  Ht 5\' 3"  (1.6 m)  Wt 192 lb (87.091 kg)  BMI 34.02 kg/m2  SpO2 99% Wt Readings from Last 3 Encounters:  07/24/15 192 lb (87.091 kg)  06/26/15 196 lb (88.905 kg)  01/15/15 188 lb 6.4 oz (85.458 kg)  Recheck blood pressure- 130/92 TSH 01/15/15- 1.348    Assessment & Plan:  1. Need for prophylactic vaccination and  inoculation against influenza - Flu Vaccine QUAD 36+ mos IM  2. Essential hypertension - blood pressure elevated today with first reading and significantly improved with recheck. Will continue metoprolol xl 25 mg  3. Obesity (BMI 30.0-34.9) - encouraged healthy food choices and walking/stretching as she can tolerate.  4. Constipation, unspecified constipation type - could be related to starting Crestor. Patient has failed other statin medications and needs to continue Crestor if possible.  - provided written and verbal instructions regarding dietary and pharmacologic interventions. She is only taking Miralax every other day, suggested she increase this to daily and can take 2-3 doses daily if needed. OK to use glycerin suppository and can add dulcolax. Increase fluids.  - She will let me know if she does not get relief or if she develops fever/pain/vomiting.   - follow up with me or Dr. Carlota Raspberry in 3- 4 months  Clarene Reamer, FNP-BC  [facility]  07/26/2015 10:05 PM

## 2015-07-26 DIAGNOSIS — E669 Obesity, unspecified: Secondary | ICD-10-CM | POA: Insufficient documentation

## 2015-07-26 DIAGNOSIS — I10 Essential (primary) hypertension: Secondary | ICD-10-CM | POA: Insufficient documentation

## 2015-08-01 ENCOUNTER — Ambulatory Visit (INDEPENDENT_AMBULATORY_CARE_PROVIDER_SITE_OTHER): Payer: Medicare Other | Admitting: *Deleted

## 2015-08-01 DIAGNOSIS — I63411 Cerebral infarction due to embolism of right middle cerebral artery: Secondary | ICD-10-CM

## 2015-08-02 NOTE — Progress Notes (Signed)
[facility] Summary Report / Loop Recorder 

## 2015-08-19 LAB — CUP PACEART REMOTE DEVICE CHECK: MDC IDC SESS DTM: 20161211190845

## 2015-08-31 ENCOUNTER — Ambulatory Visit (INDEPENDENT_AMBULATORY_CARE_PROVIDER_SITE_OTHER): Payer: Medicare Other | Admitting: *Deleted

## 2015-08-31 DIAGNOSIS — I63411 Cerebral infarction due to embolism of right middle cerebral artery: Secondary | ICD-10-CM | POA: Diagnosis not present

## 2015-09-01 NOTE — Progress Notes (Signed)
[facility] Summary Report / Loop Recorder 

## 2015-09-20 LAB — CUP PACEART REMOTE DEVICE CHECK: MDC IDC SESS DTM: 20170110193700

## 2015-09-20 NOTE — Progress Notes (Signed)
Carelink summary report received. Battery status OK. Normal device function. No new symptom episodes, tachy episodes, brady, or pause episodes. No new AF episodes. Monthly summary reports and ROV/PRN 

## 2015-09-24 ENCOUNTER — Emergency Department (HOSPITAL_COMMUNITY)
Admission: EM | Admit: 2015-09-24 | Discharge: 2015-09-24 | Disposition: A | Discharge status: Home / Self Care | Payer: Medicare Other

## 2015-09-26 LAB — CUP PACEART REMOTE DEVICE CHECK: Date Time Interrogation Session: 20170209201010

## 2015-09-26 NOTE — Progress Notes (Signed)
Carelink summary report received. Battery status OK. Normal device function. No new symptom episodes, tachy episodes, brady, or pause episodes. No new AF episodes. Monthly summary reports and ROV/PRN 

## 2015-10-02 ENCOUNTER — Ambulatory Visit (INDEPENDENT_AMBULATORY_CARE_PROVIDER_SITE_OTHER): Payer: Medicare Other | Admitting: *Deleted

## 2015-10-02 DIAGNOSIS — I63411 Cerebral infarction due to embolism of right middle cerebral artery: Secondary | ICD-10-CM

## 2015-10-02 NOTE — Progress Notes (Signed)
[facility] Summary Report / Loop Recorder 

## 2015-10-30 ENCOUNTER — Ambulatory Visit (INDEPENDENT_AMBULATORY_CARE_PROVIDER_SITE_OTHER): Payer: Medicare Other | Admitting: *Deleted

## 2015-10-30 DIAGNOSIS — I63411 Cerebral infarction due to embolism of right middle cerebral artery: Secondary | ICD-10-CM | POA: Diagnosis not present

## 2015-10-31 NOTE — Progress Notes (Signed)
[facility] Summary Report / Loop Recorder 

## 2015-11-22 ENCOUNTER — Encounter: Payer: Self-pay | Admitting: Family Medicine

## 2015-11-22 ENCOUNTER — Ambulatory Visit (INDEPENDENT_AMBULATORY_CARE_PROVIDER_SITE_OTHER): Payer: Medicare Other | Admitting: Family Medicine

## 2015-11-22 VITALS — BP 133/71 | HR 83 | Temp 97.5°F | Resp 16 | Ht 64.0 in | Wt 186.0 lb

## 2015-11-22 DIAGNOSIS — I10 Essential (primary) hypertension: Secondary | ICD-10-CM

## 2015-11-22 DIAGNOSIS — I63411 Cerebral infarction due to embolism of right middle cerebral artery: Secondary | ICD-10-CM

## 2015-11-22 DIAGNOSIS — M7989 Other specified soft tissue disorders: Secondary | ICD-10-CM | POA: Diagnosis not present

## 2015-11-22 DIAGNOSIS — R21 Rash and other nonspecific skin eruption: Secondary | ICD-10-CM

## 2015-11-22 DIAGNOSIS — E785 Hyperlipidemia, unspecified: Secondary | ICD-10-CM

## 2015-11-22 MED ORDER — CLOTRIMAZOLE 1 % EX CREA: 1.0000 "application " | TOPICAL_CREAM | Freq: Two times a day (BID) | CUTANEOUS | Status: DC

## 2015-11-22 MED ORDER — METOPROLOL SUCCINATE ER 25 MG PO TB24: 12.5000 mg | ORAL_TABLET | Freq: Every day | ORAL | Status: DC

## 2015-11-22 NOTE — Patient Instructions (Addendum)
     IF you received an x-ray today, you will receive an invoice from [facility]. Please contact [facility] at [phone] with questions or concerns regarding your invoice.   IF you received labwork today, you will receive an invoice from [employer]. Please contact [facility] at [phone] with questions or concerns regarding your invoice.   Our billing staff will not be able to assist you with questions regarding bills from these companies.  You will be contacted with the lab results as soon as they are available. The fastest way to get your results is to activate your My Chart account. Instructions are located on the last page of this paperwork. If you have not heard from [place] regarding the results in 2 weeks, please contact this office.    Increase Lubriderm or other lotion to at least 2-3 times per day. If patches on back increase - can try antifungal cream again, but I will be referring you to dermatology.   If you would like the ultrasound, let me know, but I will be referring you to vascular specialist.   Follow up in next 3-6 months.   Return to the clinic or go to the nearest emergency room if any of your symptoms worsen or new symptoms occur.  Drink at least 64 ounces of water daily. Consider a humidifier for the room where you sleep. Bathe once daily. Avoid using HOT water, as it dries skin.  Avoid deodorant soaps (Dial is the worst!) and stick with gentle cleansers (I like Cetaphil Liquid Cleanser). After bathing, dry off completely, then apply a thick emollient cream (I like Cetaphil Moisturizing Cream). Apply the cream twice daily, or more!

## 2015-11-22 NOTE — Progress Notes (Signed)
By signing my name below, I, Mary Le, attest that this documentation has been prepared under the direction and in the presence of Mary Ray, MD.  Electronically Signed: [name], Medical Scribe. 11/22/2015. 11:34 AM.  Subjective:    Patient ID: Mary Le, female    DOB: 08/16/1945, 70 y.o.   MRN: [id number]  HPI Chief Complaint  Patient presents with  . itching all over  . spots on back    x 3 mos  . left leg swollen and left foot has a bruise on it x 1 month  . Medication Refill   HPI Comments: Mary Le is a 70 y.o. female who was brought in by her daughter presents to the [facility] complaining of multiple concerns. Her last visit was Feb 2016.  Rash on back: Pt complains of worsening itchy, and dry skin. If she sits for a long time then she gets a deep itch in her groin that's describes as "down to the bone". Pt also mentions itch in her shoulder, and head.  Pt reports using Lubriderm Gold and baby oil to relieve her of dry skin QD, bathing QD with dove, and puts baby oil on her back without relief. She mentions uses antifungal cream for itch relief For some of the patches on the back. Those have improved some.  Left leg and foot edema: Has hx of CVA of right MCA. In Jan 2016 she was having some leg swelling, had a doppler/ultrasound that was neg for DVT. She has had some persistent swelling since her stroke, left side weakness since stroke in Nov 2015. She has been undergoing outpatient rehab since that time. Most recent visit with neurology Nov 2016. Continued on ASA and Plavix. Goal of ldl less than 70. bp bleow 130/90 and a1c less than 6.5%. contiued to follow up with Dr. Read Drivers for botox injections for spasticity in the left shoulder and arm. Pts daughter mentions swelling in leg has been getting worse over the last 3 months. She complains of consistent leg and foot pain with associated symptoms of discoloring in her foot, mostly rhinitis not  elevated. Pt mentions that pain gets worse over time if she doesn't have a place to put her feet. Pt reports dropping a phone over her foot with pain afterwards, and bruise from the past month or 2 in that area. No new calf pain.  Hyperlipidemia: She is not taking lipitor or crestor. She doesn't tolerate either one and has tried on a few occasions.   Medication refill for metoprolol  HTN: Was on lipitor 40 mg QD last Dec Lab Results  Component Value Date   HGBA1C 5.9* 06/28/2015   Lab Results  Component Value Date   CREATININE 0.73 06/22/2015   Lab Results  Component Value Date   CHOL 275* 06/28/2015   HDL 51 06/28/2015   LDLCALC 204* 06/28/2015   TRIG 101 06/28/2015   CHOLHDL 5.4* 06/28/2015   Patient Active Problem List   Diagnosis Date Noted  . HTN (hypertension) 07/26/2015  . Obesity (BMI 30.0-34.9) 07/26/2015  . Syncope 12/06/2014  . Spastic hemiplegia affecting nondominant side ([place]) 07/25/2014  . 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 Left hemiparesis ([place]) 06/10/2014  . Left-sided neglect 06/10/2014  . 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 Acute respiratory failure with hypoxia ([place]) 06/05/2014  . Cerebral infarction due to occlusion of right middle cerebral artery ([place]) 06/03/2014  . Cerebral infarct (  [place]) 06/03/2014  . Altered mental status 06/03/2014  . GERD (gastroesophageal reflux disease) 04/12/2014  . Dyslipidemia- statin intol 02/07/2014  . Chest pain- Low risk Myoview July 2015 02/06/2014   Past Medical History  Diagnosis Date  . Hypertension   . Hypercholesteremia   . Back pain     arthritis  . CVA (cerebral infarction)   . Fall 11/2014    fell in bathroom, uses a cane  . Joint pain     back, history of   . Weakness   . Stroke [facility])    Past Surgical History  Procedure Laterality Date  . Appendectomy    . Tonsillectomy    . Hemorroidectomy    . Tubal ligation      . Radiology with anesthesia N/A 06/03/2014    Procedure: RADIOLOGY WITH ANESTHESIA;  Surgeon: [name], MD;  Location: [place];  Service: Radiology;  Laterality: N/A;  . Tee without cardioversion N/A 06/07/2014    Procedure: TRANSESOPHAGEAL ECHOCARDIOGRAM (TEE);  Surgeon: [name], MD;  Location: [facility] ENDOSCOPY;  Service: Cardiovascular;  Laterality: N/A;  . Loop recorder implant  06/07/2014    MDT LINQ implanted by Dr [name] for cryptogenic stroke  . Loop recorder implant N/A 06/07/2014    Procedure: LOOP RECORDER IMPLANT;  Surgeon: [name], MD;  Location: [place] CATH LAB;  Service: Cardiovascular;  Laterality: N/A;   Allergies  Allergen Reactions  . Tizanidine Itching  . Aspirin Nausea And Vomiting    Tolerated baby aspirin, but with more than once per day of full strength for aches and pains - had stomach upset.  No history of PUD/gastric bleeding known.   . Statins Other (See Comments)    Myalgias and chest pain (has tried Lipitor and Pravachol)   Prior to Admission medications   Medication Sig Start Date End Date Taking? Authorizing Provider  acetaminophen (TYLENOL) 325 MG tablet Take 650 mg by mouth every 6 (six) hours as needed (pain).    Yes Historical Provider, MD  aspirin 81 MG tablet Take 81 mg by mouth daily.   Yes Historical Provider, MD  clopidogrel (PLAVIX) 75 MG tablet Take 1 tablet (75 mg total) by mouth daily. 06/26/15  Yes Garvin Fila, MD  clotrimazole (LOTRIMIN) 1 % cream Apply 1 application topically 2 (two) times daily. 09/08/14  Yes [name], MD  famotidine (PEPCID) 10 MG tablet Take 1 tablet (10 mg total) by mouth 2 (two) times daily. Patient taking differently: Take 10 mg by mouth daily as needed.  06/23/14  Yes [name] Love, PA-C  loratadine (ALAVERT) 10 MG tablet Take 10 mg by mouth daily as needed for allergies.   Yes Historical Provider, MD  metoprolol succinate (TOPROL-XL) 25 MG 24 hr tablet TAKE 1/2 TABLET EVERY DAY 03/23/15  Yes  Garvin Fila, MD  Polyethyl Glycol-Propyl Glycol (SYSTANE OP) Place 1 drop into both eyes daily as needed (dry eyeys).   Yes Historical Provider, MD  senna-docusate (SENOKOT-S) 8.6-50 MG per tablet Take 3 tablets by mouth 2 (two) times daily. Patient taking differently: Take 2 tablets by mouth 2 (two) times daily.  06/23/14  Yes [name] Love, PA-C  Multiple Vitamin (MULTIVITAMIN WITH MINERALS) TABS tablet Take 1 tablet by mouth daily. Reported on 11/22/2015    Historical Provider, MD  rosuvastatin (CRESTOR) 20 MG tablet Take 1 tablet (20 mg total) by mouth daily. Patient not taking: Reported on 11/22/2015 06/29/15   Garvin Fila, MD   Social  History   Social History  . Marital Status: Married    Spouse Name: N/A  . Number of Children: N/A  . Years of Education: N/A   Occupational History  . Not on file.   Social History Main Topics  . Smoking status: Former Smoker -- 5 years  . Smokeless tobacco: Never Used  . Alcohol Use: 0.0 oz/week    0 Standard drinks or equivalent per week     Comment: occasionally wine  . Drug Use: No  . Sexual Activity: Not on file   Other Topics Concern  . Not on file   Social History Narrative   Lives in [place]    Married. Education: [employer].    Review of Systems  Skin: Positive for color change (left foot/ankle). Negative for wound.       Dry skin all over   Objective:   Physical Exam  Constitutional: She is oriented to person, place, and time. She appears well-developed and well-nourished.  HENT:  Head: Normocephalic and atraumatic.  Eyes: Conjunctivae and EOM are normal. Pupils are equal, round, and reactive to light.  Neck: Carotid bruit is not present.  Cardiovascular: Normal rate, regular rhythm, normal heart sounds and intact distal pulses.   Pulmonary/Chest: Effort normal and breath sounds normal.  Abdominal: Soft. She exhibits no pulsatile midline mass. There is no tenderness.  Musculoskeletal: She exhibits edema.       Left  upper leg: She exhibits swelling.  Tenderness of the posterior thigh predominately with swelling left more than right  slight prominence of left calf over right. Left calf is non tender Slight darkening in toes Faint DP pulse over the left foot Toes are cool but equal temp to right Capillary refill one second  Neurological: She is alert and oriented to person, place, and time.  Skin: Skin is warm and dry. There is erythema.  Few scattered faint hyperpigmented patches on back. No vesicles or open wounds Dry apearennce on back Arms and legs with dry scaly skin few areas of excorition, but no open wounds seen. Hyperpigmentation of left ankle and foot compared to right with rounded area of more Hyperpigmentation vs faint eccymosis over dorsal of the foot.  Psychiatric: She has a normal mood and affect. Her behavior is normal.  Vitals reviewed.  Filed Vitals:   11/22/15 1126  Pulse: 83  Temp: 97.5 F (36.4 C)  TempSrc: Oral  Resp: 16  Height: 5\' 4"  (1.626 m)  Weight: 186 lb (84.369 kg)  SpO2: 97%   Assessment & Plan:   Gisel Dewinter is a 70 y.o. female Essential hypertension - Plan: metoprolol succinate (TOPROL-XL) 25 MG 24 hr tablet, COMPLETE METABOLIC PANEL WITH GFR, Lipid panel  -Stable. No change in medications for now. Refilled metoprolol 25 mg daily.  Rash and nonspecific skin eruption - Plan: clotrimazole (LOTRIMIN) 1 % cream, Ambulatory referral to Dermatology  -Primarily appears to be dry skin on upper arms, legs, back. Dry skin care reviewed with Lubriderm or other hydrating lotion multiple times per day, other information per after visit summary.  -patches on the back may have been possible tinea corporis as improved with clotrimazole, but excoriation from dry skin also possible. Refilled clotrimazole of these areas flare again, but will have evaluation with dermatology.  -Lower leg hyperpigmentation likely due to stasis dermatitis, but some bruising on top of foot. May have  venous stasis versus PVD. Will also refer to vascular surgeon for evaluation.  Leg swelling - Plan: Ambulatory referral to Vascular  Surgery  -Persistent since CVA. Now with subjective increased swelling past few months. Calf is nontender, discussed repeat ultrasound, but declined at this time. Advised that they changed her mind, I can order that without an office visit. Will also refer to vascular surgeon for further evaluation for possible  PVD. RTC precautions if worse.  Hyperlipidemia - Plan: COMPLETE METABOLIC PANEL WITH GFR, Lipid panel  -Fasting lab order provided, intolerant to statins. If elevated, can discuss options with neuro as statin recommended with previous CVA.  Meds ordered this encounter  Medications  . clotrimazole (LOTRIMIN) 1 % cream    Sig: Apply 1 application topically 2 (two) times daily.    Dispense:  30 g    Refill:  0  . metoprolol succinate (TOPROL-XL) 25 MG 24 hr tablet    Sig: Take 0.5 tablets (12.5 mg total) by mouth daily.    Dispense:  45 tablet    Refill:  1   Patient Instructions       IF you received an x-Le today, you will receive an invoice from [facility]. Please contact [facility] at [phone] with questions or concerns regarding your invoice.   IF you received labwork today, you will receive an invoice from [employer]. Please contact [facility] at [phone] with questions or concerns regarding your invoice.   Our billing staff will not be able to assist you with questions regarding bills from these companies.  You will be contacted with the lab results as soon as they are available. The fastest way to get your results is to activate your My Chart account. Instructions are located on the last page of this paperwork. If you have not heard from [place] regarding the results in 2 weeks, please contact this office.    Increase Lubriderm or other lotion to at least 2-3 times per day. If patches on back  increase - can try antifungal cream again, but I will be referring you to dermatology.   If you would like the ultrasound, let me know, but I will be referring you to vascular specialist.   Follow up in next 3-6 months.   Return to the clinic or go to the nearest emergency room if any of your symptoms worsen or new symptoms occur.  Drink at least 64 ounces of water daily. Consider a humidifier for the room where you sleep. Bathe once daily. Avoid using HOT water, as it dries skin.  Avoid deodorant soaps (Dial is the worst!) and stick with gentle cleansers (I like Cetaphil Liquid Cleanser). After bathing, dry off completely, then apply a thick emollient cream (I like Cetaphil Moisturizing Cream). Apply the cream twice daily, or more!       I personally performed the services described in this documentation, which was scribed in my presence. The recorded information has been reviewed and considered, and addended by me as needed.

## 2015-11-27 ENCOUNTER — Other Ambulatory Visit: Payer: Self-pay | Admitting: Family Medicine

## 2015-11-27 ENCOUNTER — Other Ambulatory Visit: Payer: Self-pay | Admitting: Vascular Surgery

## 2015-11-27 DIAGNOSIS — M7989 Other specified soft tissue disorders: Secondary | ICD-10-CM

## 2015-11-29 ENCOUNTER — Ambulatory Visit (INDEPENDENT_AMBULATORY_CARE_PROVIDER_SITE_OTHER): Payer: Medicare Other | Admitting: *Deleted

## 2015-11-29 DIAGNOSIS — I63411 Cerebral infarction due to embolism of right middle cerebral artery: Secondary | ICD-10-CM | POA: Diagnosis not present

## 2015-11-30 NOTE — Progress Notes (Signed)
[facility] Summary Report / Loop Recorder 

## 2015-12-07 DIAGNOSIS — L300 Nummular dermatitis: Secondary | ICD-10-CM | POA: Diagnosis not present

## 2015-12-13 ENCOUNTER — Telehealth: Payer: Self-pay

## 2015-12-13 DIAGNOSIS — I10 Essential (primary) hypertension: Secondary | ICD-10-CM

## 2015-12-13 LAB — CUP PACEART REMOTE DEVICE CHECK: Date Time Interrogation Session: 20170311203737

## 2015-12-13 NOTE — Telephone Encounter (Signed)
Pt was seen by dr Carlota Raspberry in early may and was supposed to have called in a refill on blood pressure meds but it is not at the pharmacy and patient on ly has a week left   Best number [phone]

## 2015-12-14 MED ORDER — METOPROLOL SUCCINATE ER 25 MG PO TB24: 12.5000 mg | ORAL_TABLET | Freq: Every day | ORAL | Status: DC

## 2015-12-14 NOTE — Telephone Encounter (Signed)
Spoke with daughter, Rx sent to [facility].

## 2015-12-17 LAB — CUP PACEART REMOTE DEVICE CHECK: MDC IDC SESS DTM: 20170410210824

## 2015-12-17 NOTE — Progress Notes (Signed)
Carelink summary report received. Battery status OK. Normal device function. No new symptom episodes, tachy episodes, brady, or pause episodes. No new AF episodes. Monthly summary reports and ROV/PRN 

## 2015-12-26 ENCOUNTER — Ambulatory Visit: Payer: Medicare Other | Admitting: Neurology

## 2015-12-27 ENCOUNTER — Encounter: Payer: Self-pay | Admitting: Neurology

## 2015-12-28 ENCOUNTER — Telehealth (HOSPITAL_COMMUNITY): Payer: Self-pay

## 2015-12-28 NOTE — Telephone Encounter (Signed)
Called to schedule, left message for pt to call back. AW 

## 2015-12-29 ENCOUNTER — Ambulatory Visit (INDEPENDENT_AMBULATORY_CARE_PROVIDER_SITE_OTHER): Payer: Medicare Other | Admitting: *Deleted

## 2015-12-29 DIAGNOSIS — I63411 Cerebral infarction due to embolism of right middle cerebral artery: Secondary | ICD-10-CM | POA: Diagnosis not present

## 2016-01-01 NOTE — Progress Notes (Signed)
[facility] Summary Report / Loop Recorder 

## 2016-01-02 ENCOUNTER — Other Ambulatory Visit (HOSPITAL_COMMUNITY): Payer: Self-pay | Admitting: Interventional Radiology

## 2016-01-02 DIAGNOSIS — I639 Cerebral infarction, unspecified: Secondary | ICD-10-CM

## 2016-01-05 LAB — CUP PACEART REMOTE DEVICE CHECK
Date Time Interrogation Session: 20170510213908
Date Time Interrogation Session: 20170609213831

## 2016-01-16 ENCOUNTER — Ambulatory Visit (HOSPITAL_COMMUNITY)
Admission: RE | Admit: 2016-01-16 | Discharge: 2016-01-16 | Disposition: A | Discharge status: Home / Self Care | Payer: Medicare Other | Source: Ambulatory Visit | Attending: Interventional Radiology | Admitting: Interventional Radiology

## 2016-01-16 ENCOUNTER — Ambulatory Visit (HOSPITAL_COMMUNITY): Payer: Medicare Other

## 2016-01-28 IMAGING — CT CT ANGIO HEAD
1 of 10 series · 5 of 47 positions shown · IV contrast (OMNI)
Comparison: Head CT 06/03/2014

CLINICAL DATA: Code stroke. Left-sided weakness and slurred speech.

EXAM:
CT ANGIOGRAPHY HEAD AND NECK
TECHNIQUE: Multidetector CT imaging of the head and neck was performed using
the standard protocol during bolus administration of intravenous
contrast. Multiplanar CT image reconstructions and MIPs were
obtained to evaluate the vascular anatomy. Carotid stenosis
measurements (when applicable) are obtained utilizing NASCET
criteria, using the distal internal carotid diameter as the
denominator.
CONTRAST:  50mL OMNIPAQUE IOHEXOL 350 MG/ML SOLN

[Series 5: carotid/brain 2.0 i30f 3 · axial · 0.49mm/px · z∈[-257,-35]mm · 5 of 167 slices shown]
[im 28/167  brain]
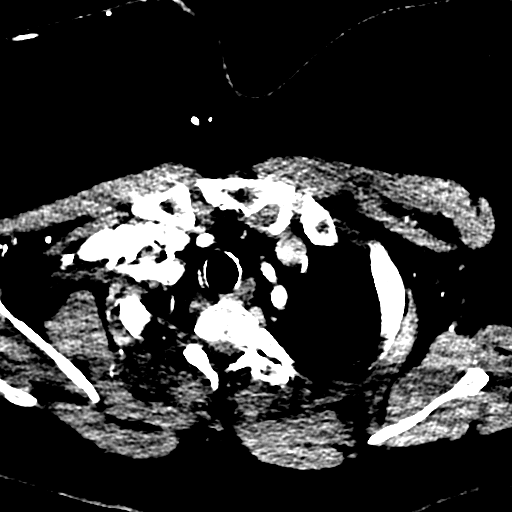
[im 56/167  bone]
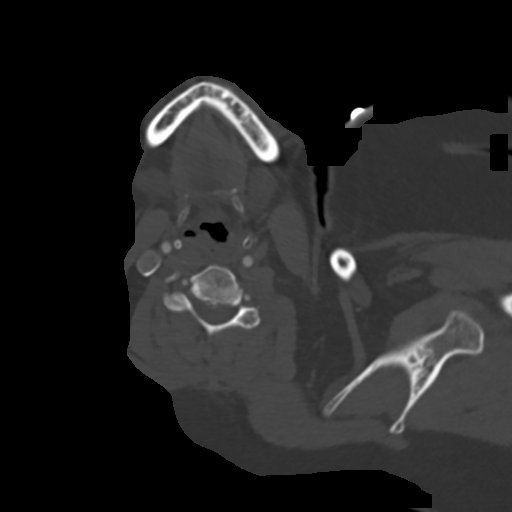
[im 84/167  brain]
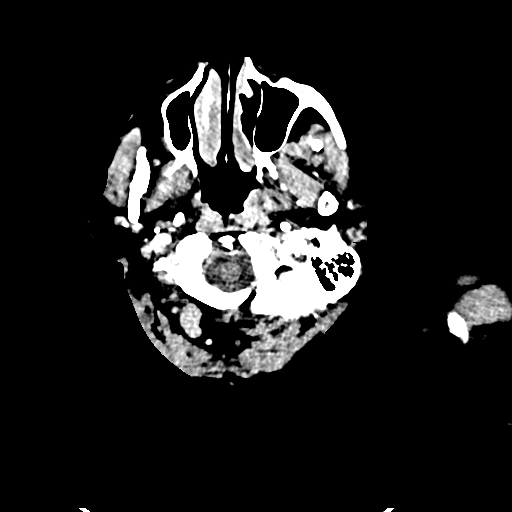
[im 111/167  bone]
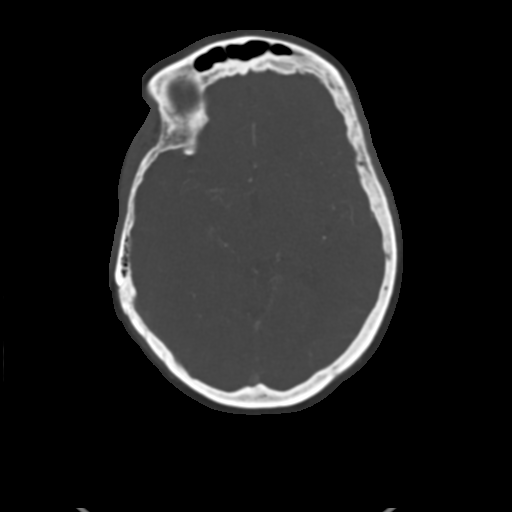
[im 139/167  brain]
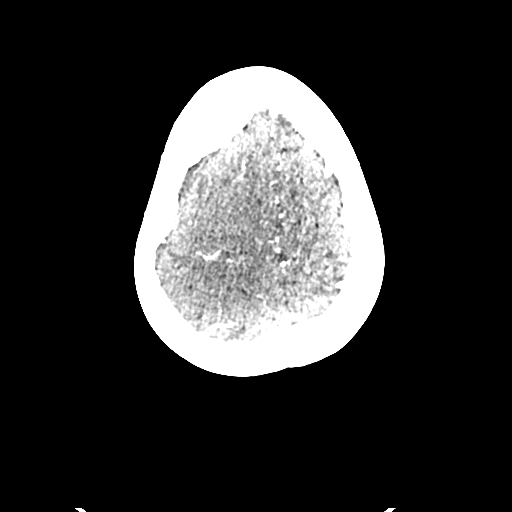

[5 of 47 positions shown; findings below may reference images not displayed]

FINDINGS: CTA HEAD FINDINGS

On postcontrast head CT images, there is question of developing
hypoattenuation in the right temporal lobe, right insula, and
possibly right parietal lobe. Ventricles and sulci are within normal
limits. No acute intracranial hemorrhage, mass, midline shift, or
extra-axial fluid collection is seen. There is no abnormal
enhancement. Orbits are unremarkable. Mastoid air cells and
paranasal sinuses are clear.

Internal carotid arteries are patent from skullbase to carotid
termini. There is mild bilateral carotid siphon calcification
without evidence of significant stenosis. There is occlusion of the
distal right M1 segment. Only minimal flow is present in some distal
right MCA branches. Left MCA is unremarkable. ACAs are unremarkable.

Intracranial vertebral arteries are patent to the basilar. PICA
origins are patent. AICAs and SCAs are not clearly identified.
Basilar artery is small in caliber diffusely, likely on a congenital
basis, without focal stenosis. There are patent posterior
communicating arteries bilaterally. No PCA stenosis is identified.
No intracranial aneurysm is identified.

Review of the MIP images confirms the above findings.

CTA NECK FINDINGS

Three vessel aortic arch. The brachiocephalic and subclavian
arteries are unremarkable. Common carotid and cervical internal
carotid arteries are patent without stenosis. Proximal left ICA is
tortuous. There is suggestion of a slightly beaded appearance to the
mid and distal cervical ICAs, however evaluation is mildly limited
by motion artifact through this region. Vertebral arteries are
patent and codominant without evidence of stenosis.

Subsegmental atelectasis is present dependently in the visualized
lungs. Subcentimeter nodule is incidentally noted in the left
thyroid lobe. Mild multilevel cervical spondylosis is noted.

Review of the MIP images confirms the above findings.
IMPRESSION: 1. Distal right M1 occlusion.
2. Mild carotid siphon calcification without significant stenosis.
3. No cervical carotid stenosis. Question bilateral cervical ICA
fibromuscular dysplasia.
4. Possible developing low density in the right temporal lobe,
parietal lobe, and insula, which may reflect acute infarct.

Critical Value/emergent results (preliminary finding of right M1
occlusion) were called by telephone at the time of interpretation on
06/03/2014 at [DATE] to Dr. JARVIS JIM , who verbally
acknowledged these results.

## 2016-01-29 ENCOUNTER — Ambulatory Visit (INDEPENDENT_AMBULATORY_CARE_PROVIDER_SITE_OTHER): Payer: Medicare Other | Admitting: *Deleted

## 2016-01-29 DIAGNOSIS — I63411 Cerebral infarction due to embolism of right middle cerebral artery: Secondary | ICD-10-CM | POA: Diagnosis not present

## 2016-01-29 NOTE — Progress Notes (Signed)
[facility] Summary Report / Loop Recorder 

## 2016-01-30 ENCOUNTER — Other Ambulatory Visit: Payer: Self-pay | Admitting: Neurology

## 2016-02-01 ENCOUNTER — Telehealth (HOSPITAL_COMMUNITY): Payer: Self-pay

## 2016-02-01 NOTE — Telephone Encounter (Signed)
Called to reschedule CT scan, left message for daughter to call back. AW

## 2016-02-21 ENCOUNTER — Ambulatory Visit (INDEPENDENT_AMBULATORY_CARE_PROVIDER_SITE_OTHER): Payer: Medicare Other | Admitting: Neurology

## 2016-02-21 ENCOUNTER — Encounter: Payer: Self-pay | Admitting: Neurology

## 2016-02-21 ENCOUNTER — Telehealth: Payer: Self-pay | Admitting: Neurology

## 2016-02-21 VITALS — BP 117/60 | HR 78 | Ht 64.0 in | Wt 189.4 lb

## 2016-02-21 DIAGNOSIS — G8110 Spastic hemiplegia affecting unspecified side: Secondary | ICD-10-CM

## 2016-02-21 DIAGNOSIS — I63411 Cerebral infarction due to embolism of right middle cerebral artery: Secondary | ICD-10-CM

## 2016-02-21 NOTE — Telephone Encounter (Signed)
Hi Mary Le Patient is ready to be scheduled for Doppler. NO PA needed. Thanks Hinton Dyer.

## 2016-02-21 NOTE — Patient Instructions (Signed)
I had a long d/w patient and daughter about her remote stroke, spastic hemiplegia, risk for recurrent stroke/TIAs, personally independently reviewed imaging studies and stroke evaluation results and answered questions.Continue aspirin 81 mg daily and clopidogrel 75 mg daily  for secondary stroke prevention and maintain strict control of hypertension with blood pressure goal below 130/90, diabetes with hemoglobin A1c goal below 6.5% and lipids with LDL cholesterol goal below 70 mg/dL. I also advised the patient to eat a healthy diet with plenty of whole grains, cereals, fruits and vegetables, exercise regularly and maintain ideal body weight. I recommend she discuss with her primary physician starting the new PCP SK9 inhibitor for her hyperlipidemia as she has statin intolerance. Check follow-up carotid ultrasound and transcranial Doppler study. Continue follow-up with Dr. Letta Pate for Botox. Followup in the future with me in one year or call earlier if necessary Alirocumab injection What is this medicine? ALIROCUMAB (al i ROC ue mab) is known as a PCSK9 inhibitor. It is used to lower the level of cholesterol in the blood. This medicine is only for patients whose cholesterol is not controlled by diet and statin therapy. This medicine may be used for other purposes; ask your health care provider or pharmacist if you have questions. What should I tell my health care provider before I take this medicine? They need to know if you have any of these conditions: -any unusual or allergic reaction to alirocumab, other medicines, foods, dyes, or preservatives -pregnant or trying to get pregnant -breast-feeding How should I use this medicine? This medicine is for injection under the skin. You will be taught how to prepare and give this medicine. Use exactly as directed. Take your medicine at regular intervals. Do not take your medicine more often than directed. It is important that you put your used needles and  syringes in a special sharps container. Do not put them in a trash can. If you do not have a sharps container, call your pharmacist or healthcare provider to get one. Talk to your pediatrician regarding the use of this medicine in children. Special care may be needed. Overdosage: If you think you have taken too much of this medicine contact a poison control center or emergency room at once. NOTE: This medicine is only for you. Do not share this medicine with others. What if I miss a dose? If you miss a dose, take it as soon as you can. If your next dose is to be taken in less than 7 days, then do not take the missed dose. Take the next dose at your regular time. Do not take double or extra doses. What may interact with this medicine? Interactions are not expected. This list may not describe all possible interactions. Give your health care provider a list of all the medicines, herbs, non-prescription drugs, or dietary supplements you use. Also tell them if you smoke, drink alcohol, or use illegal drugs. Some items may interact with your medicine. What should I watch for while using this medicine? You may need blood work done while you are taking this medicine. What side effects may I notice from receiving this medicine? Side effects that you should report to your doctor or health care professional as soon as possible: -allergic reactions like skin rash, itching or hives, swelling of the face, lips, or tongue -signs and symptoms of infection like fever or chills; cough; sore throat; pain or trouble passing urine -signs and symptoms of liver injury like dark yellow or brown urine; general ill  feeling or flu-like symptoms; light-colored stools; loss of appetite; nausea; right upper belly pain; unusually weak or tired; yellowing of the eyes or skin Side effects that usually do not require medical attention (report these to your doctor or health care professional if they continue or are  bothersome): -diarrhea -muscle cramps -muscle pain -pain, redness, or irritation at site where injected This list may not describe all possible side effects. Call your doctor for medical advice about side effects. You may report side effects to FDA at 1-800-FDA-1088. Where should I keep my medicine? Keep out of the reach of children. You will be instructed on how to store this medicine. Throw away any unused medicine after the expiration date on the label. NOTE: This sheet is a summary. It may not cover all possible information. If you have questions about this medicine, talk to your doctor, pharmacist, or health care provider.    2016, Elsevier/Gold Standard. (2014-02-17 12:33:47)

## 2016-02-21 NOTE — Progress Notes (Signed)
[facility] [address] [place]. [place] [address] [phone]       OFFICE FOLLOW-UP NOTE  [name] Date of Birth:  1946-03-11 Medical Record Number:  [id number]   HPI: 66 year African American ladywho awakened normal on 06/03/14. Went to have an MRI and returned normal. She did complain of not feeling well and laid down for a nap. When her family went to awaken her they noted a left facial droop and left sided weakness. EMS was called at that time and the patient was brought in as a code stroke. Initial NIHSS of 18.presenting with left hemiplegia, left neglect and HH, right gaze. NIH stroke scale on admission was 18. She did not receive IV t-PA due to out of window. CTA showed right M1 cut off and Dr Estanislado Pandy neuroradiologist in IR performed ENDOVASCULAR COMPLETE REVASCULARIZATION OF OCCLUDED RIGHT MIDDLE CEREBRAL ARTERY DOMINANT INFERIOR DIVISION USING SUPERSELECTIVE INTRA-ARTERIAL INTRACRANIAL INTEGRELIN AND 2 PASSES WITH THE SOLITAIRE FR Fuig was monitored in intensive care unit and pulmonary critical care followed her initially as primary attending. She was subsequently extubated and found to falling commands though left upper extremity weakness and plegia persisted. Left lower extremity strength improved to 4/5. Patient's prep was tightly controlled. Postprocedure brain imaging did not reveal significant hemorrhagic transformation. Patient notes was transferred to the neurology floor where she made steady progress. She was seen by physical occupational and speech therapy. Transthoracic echo showed no significant cardiac source of embolism. TEE also showed no cardiac source of embolism. Patient had LEt venous Doppler which was negative for DVT. She had loop recorder placed to look for paroxysmal atrial fibrillation. She was felt to be a good candidate for inpatient rehabilitation and hence was transferred there in a stable condition on  06/08/14. Her neurological deficits on day of discharge included mild left facial droop but no dysarthria. Left upper extremity was plegic 0/5. There was very mild weakness of the left lower extremity without any drift. She was started on aspirin. She has subsequently been switched to Plavix for GI upset . She has made steady progress with outpatient physical and occupational therapy and is now able to move the left arm above the shoulder but has no useful function in the left fingers. She has been getting Botox injections by Dr. Barbaraann Cao which seems to have helped her pain and spasticity. She however still needs a lot of help from her daughter for bathing, using the bathroom, cocaine and changing her clothes. She is able to ambulate with a cane and has had no recent falls. Her speech and swallowing have improved. She has not had any recurrent stroke or TIA symptoms Update 12/15/2014 : She returns for follow-up after last visit 4 months ago. She is accompanied by her daughter. Patient had a fall in the bathroom a month ago and needed several stitches. She states she slipped in the bathroom. She is walking with a cane and does reasonably well most of the time. She still has pain and spasticity in the left shoulder and arm for which she gets Botox injections by Dr. Dianna Limbo which seem to help her. She remains on Plavix which is tolerating well without bleeding or bruising. She states her blood pressure is well controlled though it is slightly elevated today at 150/88 in office. She has had no recurrent stroke or TIA symptoms but feels that after her fall her gait and balance is slightly off and on not to back to baseline. She would  like to be referred back to physical therapy for gait and balance training. She has not had any recent lipid profile checked but remains on fish oil. Update 06/26/2015 : She returns for follow-up after last visit with me 6 months ago. She is accompanied by her daughter. Patient continues  to do well and has not had any recurrent stroke or TIA symptoms. However she did not undergo lab work or carotid dopplers that I had ordered at last visit and is not clear as to why. She however underwent an MRI scan of the brain done on 06/22/15 ordered by Dr. Luanne Bras which I personally reviewed shows encephalomalacia in the right MCA territory but no acute infarct. MRA of the brain shows moderate right middle cerebral artery restenosis in the M1 segment. Patient remains on aspirin and Plavix just she is tolerating well without significant bleeding and only minor bruising. She is had fortunately no falls or serious injuries. She continues to see Dr. Kayren Eaves for Botox into her left upper extremity which seems to be helping her. She is living at home with her daughter and needs only help with activities like taking a shower or dressing herself. She is able to walk independently without cane with a fairly steady gait and has not had any major falls. Update 02/21/2016 : She returns for follow-up today accompanied by her daughter after last visit 7 months ago. She continues to do well and has not had any recurrent stroke or TIA symptoms. She continues to have spastic left hemiparesis. She needs help with activities like taking a shower and dressing herself but is able to ambulate independently. She is tolerating aspirin and Plavix without bruising or bleeding. She states her blood pressure is usually quite good and today it is 117/60. She had to discontinue Crestor due to muscle aches and pains. She has a long standing history of statin intolerance and has tried all possible statins. She has not seen Dr. Kayren Eaves for several months now for Botox but plans to do so soon. She has not had follow-up carotid ultrasound and Doppler studies done for more than a year now. ROS:   14 system review of systems is positive for   fatigue, eye itching, constipation, back pain and all the systems negative. PMH:  Past  Medical History:  Diagnosis Date  . Back pain    arthritis  . CVA (cerebral infarction)   . Fall 11/2014   fell in bathroom, uses a cane  . Hypercholesteremia   . Hypertension   . Joint pain    back, history of   . Stroke ([place])   . Weakness     Social History:  Social History   Social History  . Marital status: Married    Spouse name: N/A  . Number of children: N/A  . Years of education: N/A   Occupational History  . Not on file.   Social History Main Topics  . Smoking status: Former Smoker    Years: 5.00  . Smokeless tobacco: Never Used  . Alcohol use 0.6 oz/week    1 Glasses of wine per week     Comment: occasionally wine  . Drug use: No  . Sexual activity: Not on file   Other Topics Concern  . Not on file   Social History Narrative   Lives in [place]    Married. Education: [employer].     Medications:   Current Outpatient Prescriptions on File Prior to Visit  Medication Sig Dispense Refill  .  acetaminophen (TYLENOL) 325 MG tablet Take 650 mg by mouth every 6 (six) hours as needed (pain).     Marland Kitchen aspirin 81 MG tablet Take 81 mg by mouth daily.    . clopidogrel (PLAVIX) 75 MG tablet TAKE 1 TABLET EVERY DAY 90 tablet 2  . clotrimazole (LOTRIMIN) 1 % cream Apply 1 application topically 2 (two) times daily. 30 g 0  . famotidine (PEPCID) 10 MG tablet Take 1 tablet (10 mg total) by mouth 2 (two) times daily. (Patient taking differently: Take 10 mg by mouth daily as needed. ) 60 tablet 1  . loratadine (ALAVERT) 10 MG tablet Take 10 mg by mouth daily as needed for allergies.    . metoprolol succinate (TOPROL-XL) 25 MG 24 hr tablet Take 0.5 tablets (12.5 mg total) by mouth daily. 45 tablet 1  . Multiple Vitamin (MULTIVITAMIN WITH MINERALS) TABS tablet Take 1 tablet by mouth daily. Reported on 11/22/2015    . Polyethyl Glycol-Propyl Glycol (SYSTANE OP) Place 1 drop into both eyes daily as needed (dry eyeys).    Marland Kitchen senna-docusate (SENOKOT-S) 8.6-50 MG per tablet Take 3  tablets by mouth 2 (two) times daily. (Patient taking differently: Take 2 tablets by mouth 2 (two) times daily. ) 100 tablet 1   No current facility-administered medications on file prior to visit.     Allergies:   Allergies  Allergen Reactions  . Tizanidine Itching  . Aspirin Nausea And Vomiting    Tolerated baby aspirin, but with more than once per day of full strength for aches and pains - had stomach upset.  No history of PUD/gastric bleeding known.   . Statins Other (See Comments)    Myalgias and chest pain (has tried Lipitor and Pravachol)    Physical Exam General: well developed, well nourished middle aged African-American lady, seated, in no evident distress Head: head normocephalic and atraumatic.  Neck: supple with no carotid or supraclavicular bruits Cardiovascular: regular rate and rhythm, no murmurs Musculoskeletal: no deformity Skin:  no rash/petichiae Vascular:  Normal pulses all extremities Vitals:   02/21/16 1055  BP: 117/60  Pulse: 78   Neurologic Exam Mental Status: Awake and fully alert. Oriented to place and time. Recent and remote memory intact. Attention span, concentration and fund of knowledge appropriate. Mood and affect appropriate.  Cranial Nerves: Fundoscopic exam  not done.   Pupils equal, briskly reactive to light. Extraocular movements full without nystagmus. Visual fields full to confrontation. Hearing intact. Facial sensation intact. Mild left lower facial weakness. Tongue, palate moves normally and symmetrically.  Motor: Spastic left hemiparesis with 3/5 proximal left approximately and 0/5 distal left upper extremity strength. 4+/5 left lower extremity strength with mild weakness of ankle dorsiflexors and hip flexors. Increased tone on the left with spasticity at the left shoulder and elbow extensors. Forced flexion contracture of the left hand and fingers. Sensory.: intact to touch ,pinprick .position and vibratory sensation.  Coordination:  Impaired on the left due to weakness and normal on the right  Gait and Station:Spastic hemiplegic gait with circumduction of the left foot. DTRs: 2+ and asymmetric brisker on the left. Toes downgoing.   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 Vascular risk factors of hypertension and hyperlipidemia.    PLAN: II had a long d/w patient and daughter about her remote stroke, spastic hemiplegia, risk for recurrent stroke/TIAs, personally independently reviewed imaging studies and stroke evaluation results and answered questions.Continue aspirin 81 mg daily and clopidogrel 75 mg daily  for secondary stroke prevention and maintain strict control of hypertension with blood pressure goal below 130/90, diabetes with hemoglobin A1c goal below 6.5% and lipids with LDL cholesterol goal below 70 mg/dL. I also advised the patient to eat a healthy diet with plenty of whole grains, cereals, fruits and vegetables, exercise regularly and maintain ideal body weight. I recommend she discuss with her primary physician starting the new PCSK9 inhibitor for her hyperlipidemia as she has statin intolerance. Check follow-up carotid ultrasound and transcranial Doppler study. Continue follow-up with Dr. Letta Pate for Botox. Greater than 50% time during this 25 minute visit was spent on counseling and coordination of care about stroke and spasticity. Followup in the future with me in one year or call earlier if necessary      Antony Contras, MD Note: This document was prepared with digital dictation and possible smart phrase technology. Any transcriptional errors that result from this process are unintentional

## 2016-02-22 ENCOUNTER — Encounter: Payer: Self-pay | Admitting: Vascular Surgery

## 2016-02-27 ENCOUNTER — Ambulatory Visit (INDEPENDENT_AMBULATORY_CARE_PROVIDER_SITE_OTHER): Payer: Medicare Other | Admitting: *Deleted

## 2016-02-27 DIAGNOSIS — I63411 Cerebral infarction due to embolism of right middle cerebral artery: Secondary | ICD-10-CM

## 2016-02-28 ENCOUNTER — Ambulatory Visit (INDEPENDENT_AMBULATORY_CARE_PROVIDER_SITE_OTHER): Payer: Medicare Other | Admitting: Vascular Surgery

## 2016-02-28 ENCOUNTER — Encounter: Payer: Self-pay | Admitting: Vascular Surgery

## 2016-02-28 ENCOUNTER — Ambulatory Visit (HOSPITAL_COMMUNITY)
Admission: RE | Admit: 2016-02-28 | Discharge: 2016-02-28 | Disposition: A | Discharge status: Home / Self Care | Payer: Medicare Other | Source: Ambulatory Visit | Attending: Vascular Surgery | Admitting: Vascular Surgery

## 2016-02-28 VITALS — BP 140/66 | HR 69 | Temp 97.6°F | Resp 14 | Wt 188.0 lb

## 2016-02-28 DIAGNOSIS — I8392 Asymptomatic varicose veins of left lower extremity: Secondary | ICD-10-CM | POA: Insufficient documentation

## 2016-02-28 DIAGNOSIS — I70209 Unspecified atherosclerosis of native arteries of extremities, unspecified extremity: Secondary | ICD-10-CM

## 2016-02-28 DIAGNOSIS — I10 Essential (primary) hypertension: Secondary | ICD-10-CM | POA: Insufficient documentation

## 2016-02-28 DIAGNOSIS — M7989 Other specified soft tissue disorders: Secondary | ICD-10-CM

## 2016-02-28 DIAGNOSIS — E7800 Pure hypercholesterolemia, unspecified: Secondary | ICD-10-CM | POA: Insufficient documentation

## 2016-02-28 DIAGNOSIS — I872 Venous insufficiency (chronic) (peripheral): Secondary | ICD-10-CM | POA: Diagnosis not present

## 2016-02-28 NOTE — Progress Notes (Signed)
Vascular and Vein Specialist of [facility]  Patient name: Mary Le MRN: [id number] DOB: May 03, 1946 Sex: female  REASON FOR CONSULT: left leg and foot edema and pain. Referred by Dr. Carlota Raspberry.  HPI: Mary Le is a 70 y.o. female, who has been having left sided swelling since she had a right brain stroke in 2015. The left leg swelling has been gradually getting worse and she sent for a vascular evaluation. She is unaware of any history of DVT or phlebitis. She has paralysis of the left upper extremity. She did get some strength back and the left leg and has been ambulatory although lately her activity has been more limited according to the daughter. I do not get any history of claudication although again her activity is limited. She denies any history of rest pain or nonhealing ulcers.  I have reviewed the records that were sent from urgent medical and family care. This patient had a previous stroke in November 2015 associate with left-sided weakness. The patient had been having some leg swelling after her stroke but a duplex was reportedly negative for DVT.  Past Medical History:  Diagnosis Date  . Back pain    arthritis  . CVA (cerebral infarction)   . Fall 11/2014   fell in bathroom, uses a cane  . Hypercholesteremia   . Hypertension   . Joint pain    back, history of   . Stroke ([place])   . Weakness     Family History  Problem Relation Age of Onset  . Coronary artery disease Mother 12    MI  . Kidney disease Father     SOCIAL HISTORY: Social History   Social History  . Marital status: Married    Spouse name: N/A  . Number of children: N/A  . Years of education: N/A   Occupational History  . Not on file.   Social History Main Topics  . Smoking status: Former Smoker    Years: 5.00  . Smokeless tobacco: Never Used  . Alcohol use 0.6 oz/week    1 Glasses of wine per week     Comment: occasionally wine  . Drug use: No  . Sexual activity: Not on file   Other Topics  Concern  . Not on file   Social History Narrative   Lives in [place]    Married. Education: [employer].     Allergies  Allergen Reactions  . Tizanidine Itching  . Aspirin Nausea And Vomiting    Tolerated baby aspirin, but with more than once per day of full strength for aches and pains - had stomach upset.  No history of PUD/gastric bleeding known.   . Statins Other (See Comments)    Myalgias and chest pain (has tried Lipitor and Pravachol)    Current Outpatient Prescriptions  Medication Sig Dispense Refill  . acetaminophen (TYLENOL) 325 MG tablet Take 650 mg by mouth every 6 (six) hours as needed (pain).     Marland Kitchen aspirin 81 MG tablet Take 81 mg by mouth daily.    . clopidogrel (PLAVIX) 75 MG tablet TAKE 1 TABLET EVERY DAY 90 tablet 2  . clotrimazole (LOTRIMIN) 1 % cream Apply 1 application topically 2 (two) times daily. 30 g 0  . famotidine (PEPCID) 10 MG tablet Take 1 tablet (10 mg total) by mouth 2 (two) times daily. (Patient taking differently: Take 10 mg by mouth daily as needed. ) 60 tablet 1  . loratadine (ALAVERT) 10 MG tablet Take 10 mg by mouth daily  as needed for allergies.    . metoprolol succinate (TOPROL-XL) 25 MG 24 hr tablet Take 0.5 tablets (12.5 mg total) by mouth daily. 45 tablet 1  . Multiple Vitamin (MULTIVITAMIN WITH MINERALS) TABS tablet Take 1 tablet by mouth daily. Reported on 11/22/2015    . Polyethyl Glycol-Propyl Glycol (SYSTANE OP) Place 1 drop into both eyes daily as needed (dry eyeys).    Marland Kitchen senna-docusate (SENOKOT-S) 8.6-50 MG per tablet Take 3 tablets by mouth 2 (two) times daily. (Patient taking differently: Take 2 tablets by mouth 2 (two) times daily. ) 100 tablet 1   No current facility-administered medications for this visit.     REVIEW OF SYSTEMS:  [X]  denotes positive finding, [ ]  denotes negative finding Cardiac  Comments:  Chest pain or chest pressure:    Shortness of breath upon exertion: X   Short of breath when lying flat:    Irregular  heart rhythm:        Vascular    Pain in calf, thigh, or hip brought on by ambulation: X   Pain in feet at night that wakes you up from your sleep:     Blood clot in your veins:    Leg swelling:         Pulmonary    Oxygen at home:    Productive cough:     Wheezing:         Neurologic    Sudden weakness in arms or legs:     Sudden numbness in arms or legs:     Sudden onset of difficulty speaking or slurred speech:    Temporary loss of vision in one eye:     Problems with dizziness:         Gastrointestinal    Blood in stool:     Vomited blood:         Genitourinary    Burning when urinating:     Blood in urine:        Psychiatric    Major depression:  X       Hematologic    Bleeding problems:    Problems with blood clotting too easily:        Skin    Rashes or ulcers:        Constitutional    Fever or chills:      PHYSICAL EXAM: Vitals:   02/28/16 1241  BP: 140/66  Pulse: 69  Resp: 14  Temp: 97.6 F (36.4 C)  TempSrc: Oral  SpO2: 96%  Weight: 188 lb (85.3 kg)    GENERAL: The patient is a well-nourished female, in no acute distress. The vital signs are documented above. CARDIAC: There is a regular rate and rhythm.  VASCULAR: do not detect carotid bruits. She has palpable femoral pulses, I cannot palpate pedal pulses. He has a monophasic dorsalis pedis and posterior tibial signal bilaterally. She has mild left lower extremity swelling. She has mild left upper extremity swelling. PULMONARY: There is good air exchange bilaterally without wheezing or rales. ABDOMEN: Soft and non-tender with normal pitched bowel sounds.  MUSCULOSKELETAL: There are no major deformities or cyanosis. NEUROLOGIC: she has paralysis of the left upper extremity. SKIN: There are no ulcers or rashes noted. PSYCHIATRIC: The patient has a normal affect.  DATA:   LEFT LOWER EXTREMITY VENOUS DUPLEX: I have independently interpreted her left lower extremity venous duplex scan. There  is no evidence of DVT or superficial thrombophlebitis in the left leg. She has deep vein reflux  involving the left common femoral vein. There is some reflux at the saphenofemoral junction but no reflux in the left great saphenous vein or small saphenous vein.  MEDICAL ISSUES:  CHRONIC VENOUS INSUFFICIENCY: this patient does have some reflux in the common femoral vein on the left which could potentially explain her swelling. Importantly however she has no evidence of DVT. We have discussed the importance of intermittent leg elevation. I do not think she is a good candidate for compression stockings as it would be difficult for her to get these on. I have encouraged her to stay as active as possible to try to avoid prolonged sitting and standing. Given that I think she does have peripheral vascular disease she may not be able to elevate her legs as high as I would normally recommend.  PERIPHERAL VASCULAR DISEASE: based on her exam, she does have evidence of infrainguinal arterial occlusive disease. She has dampened monophasic Doppler signals in both feet. She has no rest pain and no ulcers. However think it would be reasonable to obtain a follow up study in 1 year and I have ordered ABIs in 1 year. I'll see her back at that time. I have encouraged her to stay as active as possible. Fortunately she is not a smoker. She is to call sooner she has problems.  Deitra Mayo [facility] [place] [phone]

## 2016-02-28 NOTE — Progress Notes (Signed)
[facility] Summary Report / Loop Recorder 

## 2016-02-29 LAB — CUP PACEART REMOTE DEVICE CHECK: Date Time Interrogation Session: 20170709224036

## 2016-03-01 ENCOUNTER — Encounter (HOSPITAL_COMMUNITY): Payer: Self-pay

## 2016-03-01 ENCOUNTER — Ambulatory Visit (HOSPITAL_COMMUNITY)
Admission: RE | Admit: 2016-03-01 | Discharge: 2016-03-01 | Disposition: A | Discharge status: Home / Self Care | Payer: Medicare Other | Source: Ambulatory Visit | Attending: Interventional Radiology | Admitting: Interventional Radiology

## 2016-03-01 DIAGNOSIS — Z8673 Personal history of transient ischemic attack (TIA), and cerebral infarction without residual deficits: Secondary | ICD-10-CM | POA: Insufficient documentation

## 2016-03-01 DIAGNOSIS — I739 Peripheral vascular disease, unspecified: Secondary | ICD-10-CM | POA: Insufficient documentation

## 2016-03-01 DIAGNOSIS — M47892 Other spondylosis, cervical region: Secondary | ICD-10-CM | POA: Insufficient documentation

## 2016-03-01 DIAGNOSIS — I639 Cerebral infarction, unspecified: Secondary | ICD-10-CM

## 2016-03-01 DIAGNOSIS — I6601 Occlusion and stenosis of right middle cerebral artery: Secondary | ICD-10-CM | POA: Diagnosis not present

## 2016-03-01 LAB — POCT I-STAT CREATININE: Creatinine, Ser: 0.8 mg/dL (ref 0.44–1.00)

## 2016-03-01 MED ORDER — IOPAMIDOL (ISOVUE-370) INJECTION 76%
INTRAVENOUS | Status: AC
  Administered 2016-03-01: 50 mL
  Filled 2016-03-01: qty 50

## 2016-03-05 LAB — CUP PACEART REMOTE DEVICE CHECK: Date Time Interrogation Session: 20170808223908

## 2016-03-07 ENCOUNTER — Other Ambulatory Visit: Payer: Medicare Other

## 2016-03-12 ENCOUNTER — Telehealth (HOSPITAL_COMMUNITY): Payer: Self-pay

## 2016-03-12 NOTE — Telephone Encounter (Signed)
Pt agreed to f/u in 6 months with CTA. AW

## 2016-03-13 ENCOUNTER — Ambulatory Visit (INDEPENDENT_AMBULATORY_CARE_PROVIDER_SITE_OTHER): Payer: Medicare Other

## 2016-03-13 DIAGNOSIS — G8110 Spastic hemiplegia affecting unspecified side: Secondary | ICD-10-CM

## 2016-03-13 DIAGNOSIS — Z0289 Encounter for other administrative examinations: Secondary | ICD-10-CM

## 2016-03-22 ENCOUNTER — Encounter: Payer: Self-pay | Admitting: Physical Medicine & Rehabilitation

## 2016-03-22 ENCOUNTER — Ambulatory Visit (HOSPITAL_BASED_OUTPATIENT_CLINIC_OR_DEPARTMENT_OTHER): Payer: Medicare Other | Admitting: Physical Medicine & Rehabilitation

## 2016-03-22 ENCOUNTER — Encounter: Payer: Medicare Other | Attending: Physical Medicine & Rehabilitation

## 2016-03-22 VITALS — BP 127/77 | HR 78 | Resp 14

## 2016-03-22 DIAGNOSIS — Z87891 Personal history of nicotine dependence: Secondary | ICD-10-CM | POA: Insufficient documentation

## 2016-03-22 DIAGNOSIS — G8110 Spastic hemiplegia affecting unspecified side: Secondary | ICD-10-CM

## 2016-03-22 DIAGNOSIS — I70209 Unspecified atherosclerosis of native arteries of extremities, unspecified extremity: Secondary | ICD-10-CM | POA: Diagnosis not present

## 2016-03-22 DIAGNOSIS — E785 Hyperlipidemia, unspecified: Secondary | ICD-10-CM | POA: Insufficient documentation

## 2016-03-22 DIAGNOSIS — I69354 Hemiplegia and hemiparesis following cerebral infarction affecting left non-dominant side: Secondary | ICD-10-CM | POA: Insufficient documentation

## 2016-03-22 NOTE — Progress Notes (Signed)
Subjective:    Patient ID: Angeliah Le, female    DOB: [birth year], 70 y.o.   MRN: [id number] 70 y.o. RH female with h/o HTN, morbid obesity, dyslipidemia who was admitted on  06/03/2014 with left-sided weakness. Cranial CT scan was  negative and patient underwent CTA head/neck which revealed distal right M1 occlusion with question of low density right temporal, parietal lobe and insula.   She underwent endovacular revascularization of R-MCA with thrombectomy by Dr. Estanislado Pandy.  MRI of the brain shows acute infarct right MCA territory in the region of the insula, frontoparietal and high parietal region. HPI Patient was last seen in physical medicine and rehabilitation clinic approximately 10 months ago. We performed botulinum toxin injection in November 2016, a total of 400 units into the pectoralis, elbow flexors, wrist and finger flexors on the left side. Unfortunately, she was lost to follow-up. Her husband sustained a stroke and ended up on the inpatient rehabilitation unit.  According to the patient and her daughter. She feels like her arm has been getting tighter over last several months. She has not been stretching her arm. She does use the resting hand splint at night on the left hand. She continues to wear a foot up orthosis. She uses a cane for ambulation. Still requires some assistance for bathing, dressing and cooking She had a fall a couple months ago but none recently. She had no severe injuries  Pain Inventory Average Pain 6 Pain Right Now 4 My pain is dull  In the last 24 hours, has pain interfered with the following? General activity 0 Relation with others 0 Enjoyment of life 0 What TIME of day is your pain at its worst? morning Sleep (in general) Good  Pain is worse with: sitting and inactivity Pain improves with: medication Relief from Meds: 5  Mobility walk with assistance use a cane how many minutes can you walk? 2-3 ability to climb steps?  yes do you drive?   no use a wheelchair Do you have any goals in this area?  yes  Function retired I need assistance with the following:  feeding, dressing, bathing, toileting, meal prep, household duties and shopping Do you have any goals in this area?  no  Neuro/Psych trouble walking depression  Prior Studies Any changes since last visit?  no  Physicians involved in your care Any changes since last visit?  no   Family History  Problem Relation Age of Onset  . Coronary artery disease Mother 65    MI  . Kidney disease Father    Social History   Social History  . Marital status: Married    Spouse name: N/A  . Number of children: N/A  . Years of education: N/A   Social History Main Topics  . Smoking status: Former Smoker    Years: 5.00  . Smokeless tobacco: Never Used  . Alcohol use 0.6 oz/week    1 Glasses of wine per week     Comment: occasionally wine  . Drug use: No  . Sexual activity: Not Asked   Other Topics Concern  . None   Social History Narrative   Lives in [place]    Married. Education: [employer].    Past Surgical History:  Procedure Laterality Date  . APPENDECTOMY    . HEMORROIDECTOMY    . LOOP RECORDER IMPLANT  06/07/2014   MDT LINQ implanted by Dr [name] for cryptogenic stroke  . LOOP RECORDER IMPLANT N/A 06/07/2014   Procedure: LOOP RECORDER IMPLANT;  Surgeon:  [name], MD;  Location: [facility] CATH LAB;  Service: Cardiovascular;  Laterality: N/A;  . RADIOLOGY WITH ANESTHESIA N/A 06/03/2014   Procedure: RADIOLOGY WITH ANESTHESIA;  Surgeon: [name], MD;  Location: [facility];  Service: Radiology;  Laterality: N/A;  . TEE WITHOUT CARDIOVERSION N/A 06/07/2014   Procedure: TRANSESOPHAGEAL ECHOCARDIOGRAM (TEE);  Surgeon: [name], MD;  Location[facility] ENDOSCOPY;  Service: Cardiovascular;  Laterality: N/A;  . TONSILLECTOMY    . TUBAL LIGATION     Past Medical History:  Diagnosis Date  . Back pain    arthritis  . CVA (cerebral infarction)   . Fall  11/2014   fell in bathroom, uses a cane  . Hypercholesteremia   . Hypertension   . Joint pain    back, history of   . Stroke ([place])   . Weakness    BP 127/77 (BP Location: Right Arm, Patient Position: Sitting, Cuff Size: Normal)   Pulse 78   Resp 14   SpO2 95%   Opioid Risk Score:   Fall Risk Score:  `1  Depression screen PHQ 2/9  Depression screen [facility] 2/9 11/22/2015 11/22/2015 07/24/2015 03/17/2015 01/15/2015 10/28/2014 01/03/2014  Decreased Interest 0 0 0 0 0 0 0  Down, Depressed, Hopeless 0 0 0 0 0 0 0  PHQ - 2 Score 0 0 0 0 0 0 0  Altered sleeping - - - - - 0 -  Change in appetite - - - - - 0 -  Feeling bad or failure about yourself  - - - - - 1 -  Trouble concentrating - - - - - 0 -  Moving slowly or fidgety/restless - - - - - 0 -  Suicidal thoughts - - - - - 0 -  PHQ-9 Score - - - - - 1 -    Review of Systems  Eyes: Negative.   Respiratory: Negative.   Cardiovascular: Positive for leg swelling.  Gastrointestinal: Positive for constipation.  Endocrine: Negative.   Genitourinary: Negative.   Musculoskeletal: Positive for arthralgias.  Skin: Negative.   Allergic/Immunologic: Negative.   Neurological: Negative.   Hematological: Negative.   Psychiatric/Behavioral: Positive for dysphoric mood.  All other systems reviewed and are negative.      Objective:   Physical Exam  Constitutional: She is oriented to person, place, and time. She appears well-developed and well-nourished.  HENT:  Head: Normocephalic and atraumatic.  Eyes: Conjunctivae and EOM are normal. Pupils are equal, round, and reactive to light.  Neck: Normal range of motion.  Neurological: She is alert and oriented to person, place, and time.  Psychiatric: She has a normal mood and affect.  Nursing note and vitals reviewed.   3 minus of the deltoid, trace at the biceps, 0 at the finger flexors, extensors Ashworth grade 3 at the elbow flexors, Ashworth 3 at the finger flexors. The elbow cannot be ranged  fully. Question if this is an Ashworth grade 4 spasticity versus and range contracture. Patient with limited external rotation at the left shoulder with pain with attempted external rotation. She has a negative impingement sign, however.  Patient ambulates with a cane and foot up orthosis. She does have some plantar flexor spasticity which is reducing her heel strike.     Assessment & Plan:  1.  Left spastic hemiplegia secondary to right MCA distribution infarct has had good results with botulinum toxin injections in the past. However, has not kept up with these and is now having some increased tightness.  The patient wishes to resume treatment. Because of the duration of time since her last injection. It is possible that she may have developed a shoulder contracture as well as an elbow contracture. I believe her wrist and fingers can be ranged fully, however. Pectoralis100 Brachioradialis50 Biceps 74 FDS50 FDP50 FPL50 Pronator 50  Would plan to follow this with some occupational therapy and possibly physical therapy May consider tibial nerve block for ankle plantar flexor spasticity.

## 2016-03-22 NOTE — Patient Instructions (Signed)
We'll get you started back on Botox injections.

## 2016-03-22 NOTE — Addendum Note (Signed)
Addended by: Thresa Ross C on: 03/22/2016 04:03 PM   Modules accepted: Orders

## 2016-03-28 ENCOUNTER — Ambulatory Visit (INDEPENDENT_AMBULATORY_CARE_PROVIDER_SITE_OTHER): Payer: Medicare Other | Admitting: *Deleted

## 2016-03-28 DIAGNOSIS — I63411 Cerebral infarction due to embolism of right middle cerebral artery: Secondary | ICD-10-CM | POA: Diagnosis not present

## 2016-03-29 NOTE — Progress Notes (Signed)
[facility] Summary Report / Loop Recorder 

## 2016-04-05 ENCOUNTER — Ambulatory Visit (HOSPITAL_BASED_OUTPATIENT_CLINIC_OR_DEPARTMENT_OTHER): Payer: Medicare Other | Admitting: Physical Medicine & Rehabilitation

## 2016-04-05 ENCOUNTER — Encounter: Payer: Self-pay | Admitting: Physical Medicine & Rehabilitation

## 2016-04-05 VITALS — BP 133/81 | HR 79 | Resp 14

## 2016-04-05 DIAGNOSIS — G8114 Spastic hemiplegia affecting left nondominant side: Secondary | ICD-10-CM

## 2016-04-05 DIAGNOSIS — Z87891 Personal history of nicotine dependence: Secondary | ICD-10-CM | POA: Diagnosis not present

## 2016-04-05 DIAGNOSIS — E785 Hyperlipidemia, unspecified: Secondary | ICD-10-CM | POA: Diagnosis not present

## 2016-04-05 DIAGNOSIS — G8110 Spastic hemiplegia affecting unspecified side: Secondary | ICD-10-CM

## 2016-04-05 DIAGNOSIS — I69354 Hemiplegia and hemiparesis following cerebral infarction affecting left non-dominant side: Secondary | ICD-10-CM | POA: Diagnosis not present

## 2016-04-05 NOTE — Patient Instructions (Signed)
You received a Botox injection today. You may experience soreness at the needle injection sites. Please call Mary Le if any of the injection sites turns red after a couple days or if there is any drainage. You may experience muscle weakness as a result of Botox. This would improve with time but can take several weeks to improve. The Botox should start working in about one week. The Botox usually last 3 months. The injection can be repeated every 3 months as needed.  Return in 6 wk We may consider injections to receive excessive tone in Left leg

## 2016-04-05 NOTE — Progress Notes (Signed)
Botox Injection for spasticity using needle EMG guidance  Dilution: 50 Units/ml Indication: Severe spasticity which interferes with ADL,mobility and/or  hygiene and is unresponsive to medication management and other conservative care Informed consent was obtained after describing risks and benefits of the procedure with the patient. This includes bleeding, bruising, infection, excessive weakness, or medication side effects. A REMS form is on file and signed. Needle: 27g 1" needle electrode Number of units per muscle Pectoralis100 Brachioradialis50 Biceps 50 FDS50 FDP50 FPL50 Pronator 50 All injections were done after obtaining appropriate EMG activity and after negative drawback for blood. The patient tolerated the procedure well. Post procedure instructions were given. A followup appointment was made.   May consider Dysport in future to allow both UE and LE treatment (also has knee extensor tone

## 2016-04-25 ENCOUNTER — Ambulatory Visit: Payer: Medicare Other | Admitting: Occupational Therapy

## 2016-04-25 ENCOUNTER — Ambulatory Visit: Payer: Medicare Other | Attending: Physical Medicine & Rehabilitation | Admitting: Physical Therapy

## 2016-04-25 ENCOUNTER — Encounter: Payer: Self-pay | Admitting: Occupational Therapy

## 2016-04-25 DIAGNOSIS — R2689 Other abnormalities of gait and mobility: Secondary | ICD-10-CM

## 2016-04-25 DIAGNOSIS — I69354 Hemiplegia and hemiparesis following cerebral infarction affecting left non-dominant side: Secondary | ICD-10-CM

## 2016-04-25 DIAGNOSIS — IMO0002 Reserved for concepts with insufficient information to code with codable children: Secondary | ICD-10-CM

## 2016-04-25 DIAGNOSIS — G8114 Spastic hemiplegia affecting left nondominant side: Secondary | ICD-10-CM | POA: Diagnosis not present

## 2016-04-25 DIAGNOSIS — R2681 Unsteadiness on feet: Secondary | ICD-10-CM | POA: Diagnosis not present

## 2016-04-25 DIAGNOSIS — M6281 Muscle weakness (generalized): Secondary | ICD-10-CM | POA: Insufficient documentation

## 2016-04-25 DIAGNOSIS — I639 Cerebral infarction, unspecified: Secondary | ICD-10-CM | POA: Insufficient documentation

## 2016-04-25 DIAGNOSIS — R293 Abnormal posture: Secondary | ICD-10-CM | POA: Diagnosis not present

## 2016-04-25 NOTE — Therapy (Signed)
[facility] [address] [place], [place], [address] Phone: [phone]   Fax:  [phone]  Occupational Therapy Evaluation  Patient Details  Name: Mary Le MRN: [id number] Date of Birth: Jan 08, 1946 Referring Provider: Dr. Letta Pate  Encounter Date: 04/25/2016      OT End of Session - 04/25/16 1525    Visit Number 1   Number of Visits 4   Date for OT Re-Evaluation 05/23/16   Authorization Type medicare will need G code   Authorization Time Period 60 days   Authorization - Visit Number 1   Authorization - Number of Visits 10   OT Start Time 1402   OT Stop Time 1430   OT Time Calculation (min) 28 min   Activity Tolerance Patient tolerated treatment well      Past Medical History:  Diagnosis Date  . Back pain    arthritis  . CVA (cerebral infarction)   . Fall 11/2014   fell in bathroom, uses a cane  . Hypercholesteremia   . Hypertension   . Joint pain    back, history of   . Stroke ([place])   . Weakness     Past Surgical History:  Procedure Laterality Date  . APPENDECTOMY    . HEMORROIDECTOMY    . LOOP RECORDER IMPLANT  06/07/2014   MDT LINQ implanted by Dr [name] for cryptogenic stroke  . LOOP RECORDER IMPLANT N/A 06/07/2014   Procedure: LOOP RECORDER IMPLANT;  Surgeon: [name], MD;  Location: [facility] CATH LAB;  Service: Cardiovascular;  Laterality: N/A;  . RADIOLOGY WITH ANESTHESIA N/A 06/03/2014   Procedure: RADIOLOGY WITH ANESTHESIA;  Surgeon: [name], MD;  Location: [facility];  Service: Radiology;  Laterality: N/A;  . TEE WITHOUT CARDIOVERSION N/A 06/07/2014   Procedure: TRANSESOPHAGEAL ECHOCARDIOGRAM (TEE);  Surgeon: [name], MD;  Location: [facility] ENDOSCOPY;  Service: Cardiovascular;  Laterality: N/A;  . TONSILLECTOMY    . TUBAL LIGATION      There were no vitals filed for this visit.      Subjective Assessment - 04/25/16 1407    Subjective  I stopped doing my stretching  exercises because I am depressed.    Patient is accompained by: Family member  dtr   Pertinent History see epic snapshot. Pt with recent botox injection and referred for splint check and HEP update prn   Patient Stated Goals My arm was getting tighter   Currently in Pain? Yes   Pain Location Leg   Pain Orientation Left   Pain Descriptors / Indicators Sore   Pain Type Chronic pain   Pain Onset More than a month ago   Pain Frequency Intermittent   Aggravating Factors  not sure   Pain Relieving Factors not sure   Multiple Pain Sites No           [facility] OT Assessment - 04/25/16 1409      Assessment   Diagnosis R CVA   Referring Provider Dr. Letta Pate   Onset Date 06/03/14   Prior Therapy Pt has signficant therapy for PT, OT and ST - inpt rehab, [place] and outpatient     Precautions   Precautions Fall     Restrictions   Weight Bearing Restrictions No     Balance Screen   Has the patient fallen in the past 6 months Yes  Pt had PT eval today   How many times? 3     Home  Environment   Family/patient expects to be  discharged to: Private residence   Living Arrangements Spouse/significant other  dtr and granddtr   Available Help at Discharge Available 24 hours/day   Type of [place] Multi-level  3 steps to get into bedroom and bath   Bathroom Shower/Tub Walk-in Musician   Additional Comments 3 in 1 commode and shower seat; grab bars in the shower      Prior Function   Level of Independence Independent with household mobility with device;Needs assistance with ADLs     ADL   Eating/Feeding Minimal assistance  cutting   Grooming Modified independent   Upper Body Bathing Minimal assistance  for drying only   Lower Body Bathing Minimal assistance  for drying only   Upper Body Dressing Minimal assistance  with bra   Lower Body Dressing Minimal assistance   Toilet Tranfer Modified independent   Toileting - Clothing Manipulation  Modified independent   [place] Transfer Supervision/safety     IADL   Shopping Needs to be accompanied on any shopping trip   [employer] Housekeeping Does not participate in any housekeeping tasks   Meal Prep Needs to have meals prepared and served   [employer] on family or friends for transportation   Medication Management Is not capable of dispensing or managing own medication   Financial Management Dependent     Mobility   Mobility Status History of falls     Written Expression   Dominant Hand Right     Vision - History   Baseline Vision Wears glasses all the time   Additional Comments Pt denies changes in vision     Activity Tolerance   Activity Tolerance Tolerates 30 min activity with muliple rests     Cognition   Overall Cognitive Status History of cognitive impairments - at baseline     Sensation   Light Touch Impaired by gross assessment   Hot/Cold Impaired by gross assessment   Proprioception Impaired by gross assessment     Coordination   Gross Motor Movements are Fluid and Coordinated No   Fine Motor Movements are Fluid and Coordinated No     Perception   Perception Impaired   Inattention/Neglect Does not attend to left visual field     Tone   Assessment Location Left Upper Extremity     ROM / Strength   AROM / PROM / Strength AROM;PROM;Strength     AROM   Overall AROM  Deficits   Overall AROM Comments Pt with no isolated shoulder flexion or abduction; pt has partial range for elbow flexion/extension influenced by flexor tone. Pt has no other isolated active movement     PROM   Overall PROM  Deficits   Overall PROM Comments Pt tolerates 90* of  shoulder flexion and abduction as well as full elbow extension. Pt has flexion contracture in L hand at MCP's and 75% composite passive flexion in L hand.  Pt has no ER and limited supination.     Strength   Overall Strength Deficits;Unable to assess    Overall Strength Comments Unable to assess due to altered tone     Hand Function   Right Hand Gross Grasp Functional   Left Hand Gross Grasp Impaired   Comment Pt has no active movement in L hand.  Pt has flexion contracture at MCP's, and only has 75% of finger composite flexion due to contracture.  Pt also with limited passive  thumb flexion at DIP.       LUE Tone   LUE Tone Moderate  botox injection 3 weeks ago                              OT Long Term Goals - 04/25/16 1520      OT LONG TERM GOAL #1   Title Pt and dtr will verbalize understanding of importance of resuming and maintaining HEP for stretching of LUE. 05/23/2016   Status New     OT LONG TERM GOAL #2   Title Pt will demonstrate understanding of wear and care of splint for L hand.   Status New               Plan - 04/25/16 1521    Clinical Impression Statement Pt is a 70 year old female s/p R CVA 06/03/2014.  Pt has received significant therapy since stroke including acute, inpt rehab, HH and outpatient therapies.  PMH: spinal spondylosis, HTN, obesity, GERD, hypoxia with stroke, syncope.  Pt with recent botox injection and referred for splint check and HEP check prn.  Pt and dtr report that pt stopped doing her HEP due to depression.  Pt's current HEP is comprehensive and pt/dtr are educated on how to do program.  WIll continue to reinforce need to do HEP consistently in order to address ROM, pain and tone.  Pt will benefit from splint check with possible modification or fabrication of new splint as dtr and pt both report current splint is no longer fitting pt well.    Rehab Potential Fair   OT Frequency 2x / week   OT Duration 4 weeks   OT Treatment/Interventions Therapeutic exercises;Splinting   Consulted and Agree with Plan of Care Patient;Family member/caregiver   Family Member Consulted dtr      Patient will benefit from skilled therapeutic intervention in order to improve the  following deficits and impairments:  Decreased range of motion  Visit Diagnosis: Spastic hemiplegia of left nondominant side due to infarction of brain [facility]) - Plan: Ot plan of care cert/re-cert      G-Codes - [id number]    Functional Assessment Tool Used skilled clinical obsevation   Functional Limitation Self care  for HEP and splint   Self Care Current Status [id number]) 100 percent impaired, limited or restricted   Self Care Goal Status [id number]) At least 1 percent but less than 20 percent impaired, limited or restricted      Problem List Patient Active Problem List   Diagnosis Date Noted  . HTN (hypertension) 07/26/2015  . Obesity (BMI 30.0-34.9) 07/26/2015  . Syncope 12/06/2014  . Spastic hemiplegia affecting nondominant side ([place]) 07/25/2014  . 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 Left hemiparesis ([place]) 06/10/2014  . Left-sided neglect 06/10/2014  . 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 Acute respiratory failure with hypoxia ([place]) 06/05/2014  . Cerebral infarction due to occlusion of right middle cerebral artery ([place]) 06/03/2014  . Cerebral infarct ([place]) 06/03/2014  . Altered mental status 06/03/2014  . GERD (gastroesophageal reflux disease) 04/12/2014  . Dyslipidemia- statin intol 02/07/2014  . Chest pain- Low risk Myoview July 2015 02/06/2014    Quay Burow, OTR/L 04/25/2016, 3:28 PM  [facility] [address] [place], [place], [address] Phone: [phone]   Fax:  [phone]  Name: Mary Le MRN: [id number] Date  of Birth: [birth year]

## 2016-04-25 NOTE — Therapy (Signed)
[facility] [address] [place], [place], [address] Phone: [phone]   Fax:  [phone]  Physical Therapy Evaluation  Patient Details  Name: Mary Le MRN: [id number] Date of Birth: August 28, 1945 Referring Provider: Dr. Alysia Penna  Encounter Date: 04/25/2016      PT End of Session - 04/25/16 2145    Visit Number 1   Number of Visits 17   Date for PT Re-Evaluation 06/24/16   Authorization Type Medicare   Authorization Time Period 04-25-16 - 06-24-16   PT Start Time 1329  pt arrived 15" late   PT Stop Time 1401   PT Time Calculation (min) 32 min      Past Medical History:  Diagnosis Date  . Back pain    arthritis  . CVA (cerebral infarction)   . Fall 11/2014   fell in bathroom, uses a cane  . Hypercholesteremia   . Hypertension   . Joint pain    back, history of   . Stroke ([place])   . Weakness     Past Surgical History:  Procedure Laterality Date  . APPENDECTOMY    . HEMORROIDECTOMY    . LOOP RECORDER IMPLANT  06/07/2014   MDT LINQ implanted by Dr [name] for cryptogenic stroke  . LOOP RECORDER IMPLANT N/A 06/07/2014   Procedure: LOOP RECORDER IMPLANT;  Surgeon: [name], MD;  Location: [facility] CATH LAB;  Service: Cardiovascular;  Laterality: N/A;  . RADIOLOGY WITH ANESTHESIA N/A 06/03/2014   Procedure: RADIOLOGY WITH ANESTHESIA;  Surgeon: [name], MD;  Location: [facility];  Service: Radiology;  Laterality: N/A;  . TEE WITHOUT CARDIOVERSION N/A 06/07/2014   Procedure: TRANSESOPHAGEAL ECHOCARDIOGRAM (TEE);  Surgeon: [name], MD;  Location: [facility] ENDOSCOPY;  Service: Cardiovascular;  Laterality: N/A;  . TONSILLECTOMY    . TUBAL LIGATION      There were no vitals filed for this visit.       Subjective Assessment - 04/25/16 2124    Subjective Pt is accompanied to PT by her daughter (arrived 32" late for eval); pt reports she received Botox in her L arm on 04-05-16; was previously  seen at this facility in 2016 for PT and OT - has not been doing exercises since her husband had a stroke in March 2017   Patient is accompained by: Family member  daughter [name]   Pertinent History loop recorder implant 06-07-14:  syncope:  lumbar spondylosis:  HTN:  cerebral infarct; acute respiratory failure with hypoxia   Patient Stated Goals "regain strength that I've lost"     "be able to walk without having to look down"            [facility] PT Assessment - 04/25/16 1335      Assessment   Medical Diagnosis R CVA with L spastic hemiplegia   Referring Provider Dr. Alysia Penna   Onset Date/Surgical Date 06/03/14  Botox received 04-05-16 (LUE); fall in Jan. 2017   Prior Therapy --  2016 - PT and OT at this facility     Precautions   Precautions Fall     Restrictions   Weight Bearing Restrictions No     Balance Screen   Has the patient fallen in the past 6 months Yes   How many times? 2-3   Has the patient had a decrease in activity level because of a fear of falling?  Yes   Is the patient reluctant to leave their home because of a fear of  falling?  Yes     [place] residence   Type of [place] Access Level entry   Home Layout Multi-level   Alternate Level Stairs-Number of Steps 3   Alternate Level Stairs-Rails Right     Prior Function   Level of Independence Independent with household mobility with device;Needs assistance with ADLs     Strength   Overall Strength Deficits   Right/Left Hip Left   Left Hip Flexion 3+/5   Right/Left Knee Left   Left Knee Flexion 3+/5   Left Knee Extension 5/5   Left Ankle Dorsiflexion 3-/5   Left Ankle Plantar Flexion 3-/5  tight Achilles tendon LLE     Transfers   Transfers Sit to Stand   Number of Reps Other reps (comment)  1   Comments RUE support with mod assist needed to stand from mat surface in room 1     Ambulation/Gait   Ambulation/Gait Yes   Ambulation/Gait  Assistance 5: Supervision   Ambulation Distance (Feet) 60 Feet   Assistive device Straight cane   Gait Pattern Decreased dorsiflexion - left;Decreased hip/knee flexion - left;Decreased arm swing - left;Decreased step length - left;Decreased stance time - left;Trunk flexed   Ambulation Surface Level;Indoor   Gait velocity 58.81 secs = .56 ft/sec                             PT Short Term Goals - 04/25/16 2158      PT SHORT TERM GOAL #1   Title Increase TUG score to </= 34 secs with SPC to demo improved functional mobility.  (05-26-16)   Baseline 42.94 secs with SPC   Time 4   Period Weeks   Status New     PT SHORT TERM GOAL #2   Title Complete Berg balance test and establish goal as appropriate.  (05-26-16)   Time 4   Period Weeks   Status New     PT SHORT TERM GOAL #3   Title Increase gait speed from .56 ft/sec to >/= .90 ft/sec with SPC for incr. gait efficiency.  (05-26-16)   Baseline .71 ft/sec with SPC on 04-25-16   Time 4   Period Weeks   Status New     PT SHORT TERM GOAL #4   Title Pt will transfer sit to stand from 18" mat surface with RUE with min assist.  (05-26-16)   Baseline RUE required with mod assist - 04-25-16   Time 4   Period Weeks   Status New     PT SHORT TERM GOAL #5   Title Independent in HEP for LLE strengthening and stretching.  (05-26-16)   Time 4   Period Weeks   Status New           PT Long Term Goals - 04/26/16 0912      PT LONG TERM GOAL #1   Title Increase Berg score by at least 5 points to demo improvement in standing balance.  (06-24-16)   Baseline TBA   Time 8   Period Weeks   Status New     PT LONG TERM GOAL #2   Title Pt will improve gait speed from .56 ft/sec to 1.2 ft/sec with cane to indicate decreased fall risk. Target:  06-24-16   Baseline .56 ft/sec with cane with L Foot up brace on 04-25-16   Time 8   Period Weeks  Status New     PT LONG TERM GOAL #3   Title Pt will amb. 360' with SPC with S for  incr. community ambulation.  (06-24-16)   Baseline 100' with CGA on 04-25-16   Time 8   Period Weeks   Status New     PT LONG TERM GOAL #4   Title Negotiate 4 steps with R hand rail using step by step sequence with S.  (06-24-16)   Baseline Step negotiation TBA   Time 8   Period Weeks   Status New     PT LONG TERM GOAL #5   Title Independent in updated HEP as appropriate.  (06-24-16)   Time 8   Period Weeks   Status New               Plan - 04/25/16 2147    Clinical Impression Statement Pt is a 70 year old lady s/p R CVA with L spastic hemiplegia which occurred in November 2015.  Pt received PT and OT at this facility in 2016: pt is accompanied to PT by her daugher, [name], who reports that pt has not been doing exercises since her father (pt's husband) had a stroke in March 2017.  Pt is wearing a foot up brace on her LLE and using a SPC for assistance with ambulation.  Pt presents with very slow gait velocity with significant gait deviations.  Pt also presents with LLE weakness with increased tone, abnormal posture, and gait and balance deficits.  PMH includes back pain due to lumbar spondylosis, HTN, s/p loop recorder implant Nov. 2015, CVA and joint pain.  Pt's status is evolving with moderate decision-making required in POC.                                                                                   Rehab Potential Fair   Clinical Impairments Affecting Rehab Potential severity of impairments and length of time since CVA   PT Frequency 2x / week   PT Duration 8 weeks   PT Treatment/Interventions ADLs/Self Care Home Management;Gait training;DME Instruction;Functional mobility training;Stair training;Therapeutic activities;Therapeutic exercise;Balance training;Neuromuscular re-education;Patient/family education;Orthotic Fit/Training;Passive range of motion   PT Next Visit Plan begin HEP - heel cord stretch in standing, LLE strengthening (supine); do 6" walk test   PT Home  Exercise Plan LLE strengthening and stretching   Recommended Other Services pt is receiving OT services   Consulted and Agree with Plan of Care Patient;Family member/caregiver   Family Member Consulted daughter      Patient will benefit from skilled therapeutic intervention in order to improve the following deficits and impairments:  Abnormal gait, Decreased activity tolerance, Decreased balance, Decreased cognition, Decreased coordination, Decreased range of motion, Decreased strength, Impaired tone, Impaired flexibility, Impaired sensation, Postural dysfunction, Impaired UE functional use, Pain  Visit Diagnosis: Abnormal posture - Plan: PT plan of care cert/re-cert  Hemiplegia and hemiparesis following cerebral infarction affecting left non-dominant side ([place]) - Plan: PT plan of care cert/re-cert  Other abnormalities of gait and mobility - Plan: PT plan of care cert/re-cert  Unsteadiness on feet - Plan: PT plan of care cert/re-cert  Muscle weakness (generalized) - Plan: PT plan  of care cert/re-cert      G-Codes - [id number]    Functional Assessment Tool Used TUG score 42.94 secs with SPC with Foot Up brace:  Gait velocity .61 ft/sec with SPC   Functional Limitation Mobility: Walking and moving around   Mobility: Walking and Moving Around Current Status [phone]) At least 80 percent but less than 100 percent impaired, limited or restricted   Mobility: Walking and Moving Around Goal Status [phone]) At least 60 percent but less than 80 percent impaired, limited or restricted       Problem List Patient Active Problem List   Diagnosis Date Noted  . HTN (hypertension) 07/26/2015  . Obesity (BMI 30.0-34.9) 07/26/2015  . Syncope 12/06/2014  . Spastic hemiplegia affecting nondominant side ([place]) 07/25/2014  . 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 Left hemiparesis ([place]) 06/10/2014  . Left-sided neglect 06/10/2014  . Thrombotic stroke involving  right middle cerebral artery ([place]) 06/08/2014  . Acute respiratory failure with hypoxia ([place]) 06/05/2014  . Cerebral infarction due to occlusion of right middle cerebral artery ([place]) 06/03/2014  . Cerebral infarct ([place]) 06/03/2014  . Altered mental status 06/03/2014  . GERD (gastroesophageal reflux disease) 04/12/2014  . Dyslipidemia- statin intol 02/07/2014  . Chest pain- Low risk Myoview July 2015 02/06/2014    Alda Lea, PT 04/26/2016, 9:48 AM  [facility] [address] [place], [place], [address] Phone: [phone]   Fax:  [phone]  Name: Mary Le MRN: [id number] Date of Birth: 07-27-1945

## 2016-04-27 LAB — CUP PACEART REMOTE DEVICE CHECK: MDC IDC SESS DTM: 20170907230538

## 2016-04-27 NOTE — Progress Notes (Signed)
Carelink summary report received. Battery status OK. Normal device function. No new symptom episodes, tachy episodes, brady, or pause episodes. No new AF episodes. Monthly summary reports and ROV/PRN 

## 2016-04-29 ENCOUNTER — Ambulatory Visit (INDEPENDENT_AMBULATORY_CARE_PROVIDER_SITE_OTHER): Payer: Medicare Other | Admitting: *Deleted

## 2016-04-29 ENCOUNTER — Encounter: Payer: Self-pay | Admitting: Occupational Therapy

## 2016-04-29 ENCOUNTER — Ambulatory Visit: Payer: Medicare Other | Admitting: Occupational Therapy

## 2016-04-29 DIAGNOSIS — R2689 Other abnormalities of gait and mobility: Secondary | ICD-10-CM | POA: Diagnosis not present

## 2016-04-29 DIAGNOSIS — I69354 Hemiplegia and hemiparesis following cerebral infarction affecting left non-dominant side: Secondary | ICD-10-CM | POA: Diagnosis not present

## 2016-04-29 DIAGNOSIS — M6281 Muscle weakness (generalized): Secondary | ICD-10-CM | POA: Diagnosis not present

## 2016-04-29 DIAGNOSIS — R293 Abnormal posture: Secondary | ICD-10-CM | POA: Diagnosis not present

## 2016-04-29 DIAGNOSIS — R2681 Unsteadiness on feet: Secondary | ICD-10-CM | POA: Diagnosis not present

## 2016-04-29 DIAGNOSIS — G8114 Spastic hemiplegia affecting left nondominant side: Secondary | ICD-10-CM | POA: Diagnosis not present

## 2016-04-29 DIAGNOSIS — IMO0002 Reserved for concepts with insufficient information to code with codable children: Secondary | ICD-10-CM

## 2016-04-29 DIAGNOSIS — I63411 Cerebral infarction due to embolism of right middle cerebral artery: Secondary | ICD-10-CM

## 2016-04-29 NOTE — Patient Instructions (Signed)
Reinforced importance compliance with HEP for LUE

## 2016-04-29 NOTE — Therapy (Signed)
[facility] [address] [place], [place], [address] Phone: [phone]   Fax:  [phone]  Occupational Therapy Treatment  Patient Details  Name: Mary Le MRN: [id number] Date of Birth: 12-05-1945 Referring Provider: Dr. Letta Pate  Encounter Date: 04/29/2016      OT End of Session - 04/29/16 1142    Visit Number 2   Number of Visits 4   Date for OT Re-Evaluation 05/23/16   Authorization Type medicare will need G code   Authorization Time Period 60 days   Authorization - Visit Number 2   Authorization - Number of Visits 10   OT Start Time 1102   OT Stop Time 1145   OT Time Calculation (min) 43 min   Activity Tolerance Patient tolerated treatment well      Past Medical History:  Diagnosis Date  . Back pain    arthritis  . CVA (cerebral infarction)   . Fall 11/2014   fell in bathroom, uses a cane  . Hypercholesteremia   . Hypertension   . Joint pain    back, history of   . Stroke ([place])   . Weakness     Past Surgical History:  Procedure Laterality Date  . APPENDECTOMY    . HEMORROIDECTOMY    . LOOP RECORDER IMPLANT  06/07/2014   MDT LINQ implanted by Dr [name] for cryptogenic stroke  . LOOP RECORDER IMPLANT N/A 06/07/2014   Procedure: LOOP RECORDER IMPLANT;  Surgeon: [name], MD;  Location: [facility] CATH LAB;  Service: Cardiovascular;  Laterality: N/A;  . RADIOLOGY WITH ANESTHESIA N/A 06/03/2014   Procedure: RADIOLOGY WITH ANESTHESIA;  Surgeon: [name], MD;  Location: [facility];  Service: Radiology;  Laterality: N/A;  . TEE WITHOUT CARDIOVERSION N/A 06/07/2014   Procedure: TRANSESOPHAGEAL ECHOCARDIOGRAM (TEE);  Surgeon: [name], MD;  Location: [facility] ENDOSCOPY;  Service: Cardiovascular;  Laterality: N/A;  . TONSILLECTOMY    . TUBAL LIGATION      There were no vitals filed for this visit.      Subjective Assessment - 04/29/16 1111    Subjective  I feel sluggish today - I got up  late and didn't eat breakfast.   Patient is accompained by: Family member  dtr   Pertinent History see epic snapshot. Pt with recent botox injection and referred for splint check and HEP update prn   Currently in Pain? No/denies                      OT Treatments/Exercises (OP) - 04/29/16 0001      Splinting   Splinting Adjusted and modified current resting hand splint as pt and dtr both stated that pt has pain in thumb and occassionally across MCP's. Adjusted web space to decrease stretch and pt reported that splint was much more comfortable.  Also replaced padding and straps as well.  Encouraged pt to make sure that that she is consistent with HEP in order to aovid losing ROM as well as to keep tone reduced and to reduce tightness in LUE. Pt and dtr both verbalized understanding.  Dtr states pt's HEP for LUE is on boards at home and pt and she and pt know what they are supposed to be doing. Current HEP is comprehensive and appropriate for pt.                  OT Education - 04/29/16 1140    Education provided Yes   Education  Details reinforced importance of compliance with HEP for LUE   Person(s) Educated Patient;Child(ren)   Methods Explanation  pt has handout and pt and dtr both verbalize they are familiar with HEP and have no questions   Comprehension Verbalized understanding             OT Long Term Goals - 04/29/16 1141      OT LONG TERM GOAL #1   Title Pt and dtr will verbalize understanding of importance of resuming and maintaining HEP for stretching of LUE. 05/23/2016   Status Achieved     OT LONG TERM GOAL #2   Title Pt will demonstrate understanding of wear and care of splint for L hand.   Status Achieved               Plan - 04/29/16 1141    Clinical Impression Statement Pt has met both LTG's and now states splint is much more comfortable. Pt and dtr both verbalize understanding of importance of HEP for LUE and state they will  resume.    Rehab Potential Fair   OT Frequency 2x / week   OT Duration 4 weeks   OT Treatment/Interventions Therapeutic exercises;Splinting   Plan d/c from OT - pt to continue with PT for functional mobility   Consulted and Agree with Plan of Care Patient;Family member/caregiver   Family Member Consulted dtr      Patient will benefit from skilled therapeutic intervention in order to improve the following deficits and impairments:  Decreased range of motion  Visit Diagnosis: Spastic hemiplegia of left nondominant side due to infarction of brain [facility])    Problem List Patient Active Problem List   Diagnosis Date Noted  . HTN (hypertension) 07/26/2015  . Obesity (BMI 30.0-34.9) 07/26/2015  . Syncope 12/06/2014  . Spastic hemiplegia affecting nondominant side ([place]) 07/25/2014  . 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 Left hemiparesis ([place]) 06/10/2014  . Left-sided neglect 06/10/2014  . 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 Acute respiratory failure with hypoxia ([place]) 06/05/2014  . Cerebral infarction due to occlusion of right middle cerebral artery ([place]) 06/03/2014  . Cerebral infarct ([place]) 06/03/2014  . Altered mental status 06/03/2014  . GERD (gastroesophageal reflux disease) 04/12/2014  . Dyslipidemia- statin intol 02/07/2014  . Chest pain- Low risk Myoview July 2015 02/06/2014    [facility] 04/29/2016, 11:51 AM  [facility] [address] [place], [place], [address] Phone: [phone]   Fax:  [phone]  Name: Mary Le MRN: [id number] Date of Birth: 1945/12/28

## 2016-04-29 NOTE — Progress Notes (Signed)
[facility] Summary Report / Loop Recorder 

## 2016-05-07 ENCOUNTER — Encounter: Payer: Medicare Other | Admitting: Occupational Therapy

## 2016-05-13 ENCOUNTER — Ambulatory Visit: Payer: Medicare Other | Admitting: Physical Therapy

## 2016-05-15 ENCOUNTER — Ambulatory Visit: Payer: Medicare Other | Admitting: Physical Therapy

## 2016-05-15 ENCOUNTER — Encounter: Payer: Self-pay | Admitting: Physical Therapy

## 2016-05-15 DIAGNOSIS — G8114 Spastic hemiplegia affecting left nondominant side: Secondary | ICD-10-CM | POA: Diagnosis not present

## 2016-05-15 DIAGNOSIS — R2681 Unsteadiness on feet: Secondary | ICD-10-CM

## 2016-05-15 DIAGNOSIS — R293 Abnormal posture: Secondary | ICD-10-CM

## 2016-05-15 DIAGNOSIS — R2689 Other abnormalities of gait and mobility: Secondary | ICD-10-CM | POA: Diagnosis not present

## 2016-05-15 DIAGNOSIS — M6281 Muscle weakness (generalized): Secondary | ICD-10-CM | POA: Diagnosis not present

## 2016-05-15 DIAGNOSIS — I69354 Hemiplegia and hemiparesis following cerebral infarction affecting left non-dominant side: Secondary | ICD-10-CM | POA: Diagnosis not present

## 2016-05-15 NOTE — Therapy (Signed)
[facility] [address] [place], [place], [address] Phone: [phone]   Fax:  [phone]  Physical Therapy Treatment  Patient Details  Name: Mary Le MRN: [id number] Date of Birth: [birth year] Referring Provider: Dr. Alysia Penna  Encounter Date: 05/15/2016      PT End of Session - 05/15/16 1240    Visit Number 2   Number of Visits 17   Date for PT Re-Evaluation 06/24/16   Authorization Type Medicare   Authorization Time Period 04-25-16 - 06-24-16   PT Start Time 1233   PT Stop Time 1319   PT Time Calculation (min) 46 min   Equipment Utilized During Treatment Gait belt   Activity Tolerance Patient tolerated treatment well;Patient limited by fatigue   Behavior During Therapy [facility] for tasks assessed/performed      Past Medical History:  Diagnosis Date  . Back pain    arthritis  . CVA (cerebral infarction)   . Fall 11/2014   fell in bathroom, uses a cane  . Hypercholesteremia   . Hypertension   . Joint pain    back, history of   . Stroke ([place])   . Weakness     Past Surgical History:  Procedure Laterality Date  . APPENDECTOMY    . HEMORROIDECTOMY    . LOOP RECORDER IMPLANT  06/07/2014   MDT LINQ implanted by Dr [name] for cryptogenic stroke  . LOOP RECORDER IMPLANT N/A 06/07/2014   Procedure: LOOP RECORDER IMPLANT;  Surgeon: [name], MD;  Location: [facility] CATH LAB;  Service: Cardiovascular;  Laterality: N/A;  . RADIOLOGY WITH ANESTHESIA N/A 06/03/2014   Procedure: RADIOLOGY WITH ANESTHESIA;  Surgeon: [name], MD;  Location: [facility];  Service: Radiology;  Laterality: N/A;  . TEE WITHOUT CARDIOVERSION N/A 06/07/2014   Procedure: TRANSESOPHAGEAL ECHOCARDIOGRAM (TEE);  Surgeon: [name], MD;  Location: [facility] ENDOSCOPY;  Service: Cardiovascular;  Laterality: N/A;  . TONSILLECTOMY    . TUBAL LIGATION      There were no vitals filed for this visit.      Subjective Assessment -  05/15/16 1239    Subjective No new complaints. No falls or pain to report at this time. Is going for botox to left leg on Friday with Dr Letta Pate.    Patient is accompained by: Family member  daughter, [name]   Pertinent History loop recorder implant 06-07-14:  syncope:  lumbar spondylosis:  HTN:  cerebral infarct; acute respiratory failure with hypoxia   Patient Stated Goals "regain strength that I've lost"     "be able to walk without having to look down"   Currently in Pain? No/denies   Pain Score 0-No pain            [facility] PT Assessment - 05/15/16 1242      Transfers   Transfers Sit to Stand;Stand to Sit   Sit to Stand 5: Supervision;4: Min guard;With upper extremity assist;From bed;From chair/3-in-1   Stand to Sit 5: Supervision;With upper extremity assist;With armrests;To bed;To chair/3-in-1     Ambulation/Gait   Ambulation/Gait Yes   Ambulation/Gait Assistance 4: Min guard;4: Min assist   Ambulation/Gait Assistance Details increased assistance needed as gait distance progressed. increased left toe scuffing and knee genu recuvatum noted as well as gait distance progressed.   Ambulation Distance (Feet) 184 Feet   Assistive device Straight cane   Gait Pattern Decreased dorsiflexion - left;Decreased hip/knee flexion - left;Decreased arm swing - left;Decreased step length - left;Decreased stance time -  left;Trunk flexed;Left genu recurvatum     6 Minute Walk- Baseline   6 Minute Walk- Baseline yes   BP (mmHg) (!)  143/91   HR (bpm) 80   02 Sat (%RA) 91 %   Modified Borg Scale for Dyspnea 0- Nothing at all   Perceived Rate of Exertion (Borg) 6-     6 Minute walk- Post Test   6 Minute Walk Post Test yes   BP (mmHg) 117/72   HR (bpm) 83   02 Sat (%RA) 99 %   Modified Borg Scale for Dyspnea 2- Mild shortness of breath   Perceived Rate of Exertion (Borg) 11- Fairly light     6 minute walk test results    Aerobic Endurance Distance Walked 148   Endurance additional  comments with straight cane, min guard to min assist. 1 episode of left toe scuffing needing min assist to correct balance. left knee genu recurvatum noted toward end of 6 minute walk test.     Merrilee Jansky Balance Test   Sit to Stand Able to stand using hands after several tries   Standing Unsupported Able to stand 2 minutes with supervision   Sitting with Back Unsupported but Feet Supported on Floor or Stool Able to sit safely and securely 2 minutes   Stand to Sit Controls descent by using hands   Transfers Able to transfer safely, definite need of hands   Standing Unsupported with Eyes Closed Able to stand 10 seconds with supervision   Standing Ubsupported with Feet Together Able to place feet together independently and stand for 1 minute with supervision   From Standing, Reach Forward with Outstretched Arm Can reach forward >5 cm safely (2")  3 inches   From Standing Position, Pick up Object from Floor Able to pick up shoe, needs supervision   From Standing Position, Turn to Look Behind Over each Shoulder Turn sideways only but maintains balance   Turn 360 Degrees Able to turn 360 degrees safely but slowly  > 9 sec's each way   Standing Unsupported, Alternately Place Feet on Step/Stool Needs assistance to keep from falling or unable to try   Standing Unsupported, One Foot in Front Able to take small step independently and hold 30 seconds   Standing on One Leg Unable to try or needs assist to prevent fall   Total Score 32   Berg comment: 32/56= high risk for falls (close to 100%)             PT Short Term Goals - 05/15/16 1339      PT SHORT TERM GOAL #1   Title Increase TUG score to </= 34 secs with SPC to demo improved functional mobility.  (05-26-16)   Baseline 42.94 secs with SPC   Time 4   Period Weeks   Status On-going     PT SHORT TERM GOAL #2   Title Complete Berg balance test and establish goal as appropriate.  (05-26-16)   Baseline 05/15/16: 32/56 baseline value established  today- PT to set goals as appropriate.   Status Achieved     PT SHORT TERM GOAL #3   Title Increase gait speed from .56 ft/sec to >/= .90 ft/sec with SPC for incr. gait efficiency.  (05-26-16)   Baseline .97 ft/sec with SPC on 04-25-16   Time 4   Period Weeks   Status On-going     PT SHORT TERM GOAL #4   Title Pt will transfer sit to stand from 18" mat surface  with RUE with min assist.  (05-26-16)   Baseline RUE required with mod assist - 04-25-16   Time 4   Period Weeks   Status On-going     PT SHORT TERM GOAL #5   Title Independent in HEP for LLE strengthening and stretching.  (05-26-16)   Time 4   Period Weeks   Status On-going           PT Long Term Goals - 04/26/16 0912      PT LONG TERM GOAL #1   Title Increase Berg score by at least 5 points to demo improvement in standing balance.  (06-24-16)   Baseline TBA   Time 8   Period Weeks   Status New     PT LONG TERM GOAL #2   Title Pt will improve gait speed from .56 ft/sec to 1.2 ft/sec with cane to indicate decreased fall risk. Target:  06-24-16   Baseline .56 ft/sec with cane with L Foot up brace on 04-25-16   Time 8   Period Weeks   Status New     PT LONG TERM GOAL #3   Title Pt will amb. 360' with SPC with S for incr. community ambulation.  (06-24-16)   Baseline 100' with CGA on 04-25-16   Time 8   Period Weeks   Status New     PT LONG TERM GOAL #4   Title Negotiate 4 steps with R hand rail using step by step sequence with S.  (06-24-16)   Baseline Step negotiation TBA   Time 8   Period Weeks   Status New     PT LONG TERM GOAL #5   Title Independent in updated HEP as appropriate.  (06-24-16)   Time 8   Period Weeks   Status New            Plan - 05/15/16 1241    Clinical Impression Statement Today's skilled session focused on establishment of base line values for 6 minute walk test and Berg balance test. Pt's berg score does place her in the high falls risk category. Pt also noted to have decreased  left foot clearance with gait and knee genu recurvatum as well. Currently using foot up brace only on left leg for foot drop. May need more strudy brace, however she is also scheduled to have botox on this Friday 05/17/16, so most likley bracing will be assessed after Botox. Pt should benefit from continued PT to progress toward goals.                       Rehab Potential Fair   Clinical Impairments Affecting Rehab Potential severity of impairments and length of time since CVA   PT Frequency 2x / week   PT Duration 8 weeks   PT Treatment/Interventions ADLs/Self Care Home Management;Gait training;DME Instruction;Functional mobility training;Stair training;Therapeutic activities;Therapeutic exercise;Balance training;Neuromuscular re-education;Patient/family education;Orthotic Fit/Training;Passive range of motion   PT Next Visit Plan begin HEP - heel cord stretch in standing, LLE strengthening (supine);PT to set STG for Berg balance test if indicated.   PT Home Exercise Plan LLE strengthening and stretching   Consulted and Agree with Plan of Care Patient;Family member/caregiver   Family Member Consulted daughter      Patient will benefit from skilled therapeutic intervention in order to improve the following deficits and impairments:  Abnormal gait, Decreased activity tolerance, Decreased balance, Decreased cognition, Decreased coordination, Decreased range of motion, Decreased strength, Impaired tone, Impaired flexibility, Impaired sensation,  Postural dysfunction, Impaired UE functional use, Pain  Visit Diagnosis: Other abnormalities of gait and mobility  Unsteadiness on feet  Muscle weakness (generalized)  Abnormal posture  Hemiplegia and hemiparesis following cerebral infarction affecting left non-dominant side [facility])     Problem List Patient Active Problem List   Diagnosis Date Noted  . HTN (hypertension) 07/26/2015  . Obesity (BMI 30.0-34.9) 07/26/2015  . Syncope 12/06/2014  .  Spastic hemiplegia affecting nondominant side ([place]) 07/25/2014  . 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 Left hemiparesis ([place]) 06/10/2014  . Left-sided neglect 06/10/2014  . 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 Acute respiratory failure with hypoxia ([place]) 06/05/2014  . Cerebral infarction due to occlusion of right middle cerebral artery ([place]) 06/03/2014  . Cerebral infarct ([place]) 06/03/2014  . Altered mental status 06/03/2014  . GERD (gastroesophageal reflux disease) 04/12/2014  . Dyslipidemia- statin intol 02/07/2014  . Chest pain- Low risk Myoview July 2015 02/06/2014    Willow Ora, PTA, [facility] [address] [place], [place] [address] [phone] 05/15/16, 1:41 PM   Name: Mary Le MRN: [id number] Date of Birth: 30-Aug-1945

## 2016-05-16 ENCOUNTER — Ambulatory Visit: Payer: Medicare Other | Admitting: Physical Therapy

## 2016-05-17 ENCOUNTER — Encounter: Payer: Self-pay | Admitting: Physical Medicine & Rehabilitation

## 2016-05-17 ENCOUNTER — Encounter: Payer: Medicare Other | Attending: Physical Medicine & Rehabilitation

## 2016-05-17 ENCOUNTER — Ambulatory Visit (HOSPITAL_BASED_OUTPATIENT_CLINIC_OR_DEPARTMENT_OTHER): Payer: Medicare Other | Admitting: Physical Medicine & Rehabilitation

## 2016-05-17 VITALS — BP 121/81 | HR 98 | Resp 14

## 2016-05-17 DIAGNOSIS — I70209 Unspecified atherosclerosis of native arteries of extremities, unspecified extremity: Secondary | ICD-10-CM | POA: Diagnosis not present

## 2016-05-17 DIAGNOSIS — I69354 Hemiplegia and hemiparesis following cerebral infarction affecting left non-dominant side: Secondary | ICD-10-CM | POA: Diagnosis not present

## 2016-05-17 DIAGNOSIS — E785 Hyperlipidemia, unspecified: Secondary | ICD-10-CM | POA: Insufficient documentation

## 2016-05-17 DIAGNOSIS — G8110 Spastic hemiplegia affecting unspecified side: Secondary | ICD-10-CM | POA: Diagnosis not present

## 2016-05-17 DIAGNOSIS — Z87891 Personal history of nicotine dependence: Secondary | ICD-10-CM | POA: Insufficient documentation

## 2016-05-17 NOTE — Progress Notes (Signed)
Subjective:    Patient ID: Mary Le, female    DOB: 1946-01-26, 70 y.o.   MRN: [id number]  HPI Patient returns today after Botox injection 6 weeks ago. Total of 400 units were injected targeting the left pectoralis left biceps, brachial radialis, wrist and finger flexors. She's had good relaxation of left upper extremity. She's had outpatient OT go over exercise program as well as splinting. We discussed that the Botox does not improve. Motor strength or necessarily function, but can aid with hygiene as well as positioning and ease of ADLs Pain Inventory Average Pain 4 Pain Right Now 0 My pain is dull  In the last 24 hours, has pain interfered with the following? General activity 4 Relation with others 3 Enjoyment of life 2 What TIME of day is your pain at its worst? morning Sleep (in general) Good  Pain is worse with: inactivity Pain improves with: medication Relief from Meds: 10  Mobility walk with assistance use a cane how many minutes can you walk? 7 ability to climb steps?  yes do you drive?  no transfers alone Do you have any goals in this area?  yes  Function retired I need assistance with the following:  meal prep, household duties and shopping  Neuro/Psych No problems in this area  Prior Studies Any changes since last visit?  no  Physicians involved in your care Any changes since last visit?  no   Family History  Problem Relation Age of Onset  . Coronary artery disease Mother 40    MI  . Kidney disease Father    Social History   Social History  . Marital status: Married    Spouse name: N/A  . Number of children: N/A  . Years of education: N/A   Social History Main Topics  . Smoking status: Former Smoker    Years: 5.00  . Smokeless tobacco: Never Used  . Alcohol use 0.6 oz/week    1 Glasses of wine per week     Comment: occasionally wine  . Drug use: No  . Sexual activity: Not Asked   Other Topics Concern  . None   Social History  Narrative   Lives in [place]    Married. Education: [employer].    Past Surgical History:  Procedure Laterality Date  . APPENDECTOMY    . HEMORROIDECTOMY    . LOOP RECORDER IMPLANT  06/07/2014   MDT LINQ implanted by Dr [name] for cryptogenic stroke  . LOOP RECORDER IMPLANT N/A 06/07/2014   Procedure: LOOP RECORDER IMPLANT;  Surgeon: [name], MD;  Location: [facility] CATH LAB;  Service: Cardiovascular;  Laterality: N/A;  . RADIOLOGY WITH ANESTHESIA N/A 06/03/2014   Procedure: RADIOLOGY WITH ANESTHESIA;  Surgeon: [name], MD;  Location: [facility];  Service: Radiology;  Laterality: N/A;  . TEE WITHOUT CARDIOVERSION N/A 06/07/2014   Procedure: TRANSESOPHAGEAL ECHOCARDIOGRAM (TEE);  Surgeon: [name], MD;  Location: [facility] ENDOSCOPY;  Service: Cardiovascular;  Laterality: N/A;  . TONSILLECTOMY    . TUBAL LIGATION     Past Medical History:  Diagnosis Date  . Back pain    arthritis  . CVA (cerebral infarction)   . Fall 11/2014   fell in bathroom, uses a cane  . Hypercholesteremia   . Hypertension   . Joint pain    back, history of   . Stroke ([place])   . Weakness    BP 121/81   Pulse 98   Resp 14   SpO2 96%  Opioid Risk Score:   Fall Risk Score:  `1  Depression screen PHQ 2/9  Depression screen [facility] 2/9 11/22/2015 11/22/2015 07/24/2015 03/17/2015 01/15/2015 10/28/2014 01/03/2014  Decreased Interest 0 0 0 0 0 0 0  Down, Depressed, Hopeless 0 0 0 0 0 0 0  PHQ - 2 Score 0 0 0 0 0 0 0  Altered sleeping - - - - - 0 -  Change in appetite - - - - - 0 -  Feeling bad or failure about yourself  - - - - - 1 -  Trouble concentrating - - - - - 0 -  Moving slowly or fidgety/restless - - - - - 0 -  Suicidal thoughts - - - - - 0 -  PHQ-9 Score - - - - - 1 -     Review of Systems  Musculoskeletal:       Limb swelling  Skin: Positive for rash.  All other systems reviewed and are negative.      Objective:   Physical Exam  Constitutional: She is oriented to person, place, and  time. She appears well-developed and well-nourished.  HENT:  Head: Normocephalic and atraumatic.  Eyes: Pupils are equal, round, and reactive to light.  Neurological: She is alert and oriented to person, place, and time.  Skin: Skin is warm and dry.  Psychiatric: She has a normal mood and affect.  Nursing note and vitals reviewed.    Lumbricals Ashworth grade 3 Biceps Ashworth grade 3, finger flexors, Ashworth grade 1, wrist flexors, Ashworth grade 1 Motor strength to minus. Left grip to minus biceps and triceps, trace deltoid Right upper extremity 5/5 in the deltoid, biceps, triceps, grip     Assessment & Plan:  1. Left spastic hemiplegia secondary to right CVA If the next injection is with Botox. The following muscles will be injected,  if it is Dysport Pectoralis100,             200 Brachioradialis50         100 Biceps 50                     200 FDS50                          200 FDP50                          200 FPL50                           100 Lumbricals     100 Pronator 50                   200 Tibialis posterior   200 If we decide to do the leg in December, we would have to cut back on the dose of the upper extremity

## 2016-05-17 NOTE — Patient Instructions (Signed)
We discussed that the Botox can only loosen the muscles and not provide any increased strength. Goals would be to avoid hygiene problems under the armpit or in the left hand. Also discussed that the goal of treatment would be loosening the elbow to assist with dressing. The other medicine besides Botox is called Dysport and can last a couple weeks longer than the Botox. We could also use a higher dose.

## 2016-05-21 ENCOUNTER — Ambulatory Visit: Payer: Medicare Other | Admitting: Physical Therapy

## 2016-05-23 ENCOUNTER — Ambulatory Visit: Payer: Medicare Other | Admitting: Physical Therapy

## 2016-05-27 ENCOUNTER — Ambulatory Visit (INDEPENDENT_AMBULATORY_CARE_PROVIDER_SITE_OTHER): Payer: Medicare Other | Admitting: *Deleted

## 2016-05-27 ENCOUNTER — Ambulatory Visit: Payer: Medicare Other | Admitting: Physical Therapy

## 2016-05-27 DIAGNOSIS — I63411 Cerebral infarction due to embolism of right middle cerebral artery: Secondary | ICD-10-CM

## 2016-05-28 NOTE — Progress Notes (Signed)
[facility] Summary Report / Loop Recorder 

## 2016-05-30 ENCOUNTER — Ambulatory Visit: Payer: Medicare Other | Admitting: Family Medicine

## 2016-05-30 ENCOUNTER — Ambulatory Visit: Payer: Medicare Other | Admitting: Physical Therapy

## 2016-06-02 LAB — CUP PACEART REMOTE DEVICE CHECK
Implantable Pulse Generator Implant Date: 20151117
MDC IDC SESS DTM: 20171008000947

## 2016-06-02 NOTE — Progress Notes (Signed)
Carelink summary report received. Battery status OK. Normal device function. No new symptom episodes, tachy episodes, brady, or pause episodes. No new AF episodes. Monthly summary reports and ROV/PRN 

## 2016-06-03 ENCOUNTER — Ambulatory Visit: Payer: Medicare Other | Attending: Physical Medicine & Rehabilitation | Admitting: Physical Therapy

## 2016-06-03 DIAGNOSIS — R2689 Other abnormalities of gait and mobility: Secondary | ICD-10-CM | POA: Diagnosis not present

## 2016-06-03 DIAGNOSIS — M6281 Muscle weakness (generalized): Secondary | ICD-10-CM | POA: Diagnosis not present

## 2016-06-03 NOTE — Therapy (Signed)
[facility] [address] [place], [place], [address] Phone: [phone]   Fax:  [phone]  Physical Therapy Treatment  Patient Details  Name: Mary Le MRN: [id number] Date of Birth: 04/17/1946 Referring Provider: Dr. Alysia Penna  Encounter Date: 06/03/2016      PT End of Session - 06/03/16 2045    Visit Number 3   Number of Visits 17   Date for PT Re-Evaluation 06/24/16   Authorization Type Medicare   Authorization Time Period 04-25-16 - 06-24-16   PT Start Time 1316   PT Stop Time 1404   PT Time Calculation (min) 48 min      Past Medical History:  Diagnosis Date  . Back pain    arthritis  . CVA (cerebral infarction)   . Fall 11/2014   fell in bathroom, uses a cane  . Hypercholesteremia   . Hypertension   . Joint pain    back, history of   . Stroke ([place])   . Weakness     Past Surgical History:  Procedure Laterality Date  . APPENDECTOMY    . HEMORROIDECTOMY    . LOOP RECORDER IMPLANT  06/07/2014   MDT LINQ implanted by Dr [name] for cryptogenic stroke  . LOOP RECORDER IMPLANT N/A 06/07/2014   Procedure: LOOP RECORDER IMPLANT;  Surgeon: [name], MD;  Location: [facility];  Service: Cardiovascular;  Laterality: N/A;  . RADIOLOGY WITH ANESTHESIA N/A 06/03/2014   Procedure: RADIOLOGY WITH ANESTHESIA;  Surgeon: [name], MD;  Location: [facility];  Service: Radiology;  Laterality: N/A;  . TEE WITHOUT CARDIOVERSION N/A 06/07/2014   Procedure: TRANSESOPHAGEAL ECHOCARDIOGRAM (TEE);  Surgeon: [name], MD;  Location: [facility] ENDOSCOPY;  Service: Cardiovascular;  Laterality: N/A;  . TONSILLECTOMY    . TUBAL LIGATION      There were no vitals filed for this visit.      Subjective Assessment - 06/03/16 2035    Subjective Pt reports she went to MD appt but states "they didn't do the Botox because I'm scheduled to get it in December and I couldn't get it both times"   Patient is  accompained by: Family member   Pertinent History loop recorder implant 06-07-14:  syncope:  lumbar spondylosis:  HTN:  cerebral infarct; acute respiratory failure with hypoxia   Patient Stated Goals "regain strength that I've lost"     "be able to walk without having to look down"   Currently in Pain? No/denies                         [facility] Adult PT Treatment/Exercise - 06/03/16 1325      Transfers   Transfers Sit to Stand   Sit to Stand 5: Supervision   Number of Reps Other reps (comment)  5 reps   Comments no UE support used - from hi/lo mat table     Ambulation/Gait   Ambulation/Gait Yes   Ambulation/Gait Assistance 4: Min guard   Ambulation/Gait Assistance Details 1 occurrence of L toe scuffing floor; heel wedge in L shoe for 1st lap   Ambulation Distance (Feet) 120 Feet  2 reps - seated rest break between laps   Assistive device Straight cane  tripod based cane used on 2nd lap   Gait Pattern Decreased hip/knee flexion - left;Decreased step length - left   Ambulation Surface Level;Indoor     Lumbar Exercises: Seated   Long Arc Quad on Chair Left;1  set;10 reps     Lumbar Exercises: Supine   Bridge 10 reps   Other Supine Lumbar Exercises L hip extension control exercise 10 reps off side of hi/lo mat table     Knee/Hip Exercises: Supine   Other Supine Knee/Hip Exercises L hip abductio in supine position with manual moderate resistance x 10 reps     Ottobock AFO's attempted to be trialed but did not fit inside pt's shoe - heel wedge only used in L shoe to reduce hyperextension  Of L knee but did not appear to be of significant benefit in reducing genu recurvatum  Foot up brace donned after AFO's were attempted to be placed in L shoe (both were too large and would not fit in shoe)           PT Short Term Goals - 06/03/16 2052      PT SHORT TERM GOAL #1   Title Increase TUG score to </= 34 secs with SPC to demo improved functional mobility.   (05-26-16)   Baseline 42.94 secs with SPC   Status On-going     PT SHORT TERM GOAL #2   Title Complete Berg balance test and establish goal as appropriate.  (05-26-16)  Berg score >/= 36/56  (set by [id number], PT on 06-03-16)   Baseline 05/15/16: 32/56 baseline value established today- PT to set goals as appropriate.     PT SHORT TERM GOAL #3   Title Increase gait speed from .56 ft/sec to >/= .90 ft/sec with SPC for incr. gait efficiency.  (05-26-16)   Baseline .65 ft/sec with SPC on 04-25-16     PT SHORT TERM GOAL #4   Title Pt will transfer sit to stand from 18" mat surface with RUE with min assist.  (05-26-16)   Baseline RUE required with mod assist - 04-25-16     PT SHORT TERM GOAL #5   Title Independent in HEP for LLE strengthening and stretching.  (05-26-16)   Status On-going           PT Long Term Goals - 04/26/16 0912      PT LONG TERM GOAL #1   Title Increase Berg score by at least 5 points to demo improvement in standing balance.  (06-24-16)   Baseline TBA   Time 8   Period Weeks   Status New     PT LONG TERM GOAL #2   Title Pt will improve gait speed from .56 ft/sec to 1.2 ft/sec with cane to indicate decreased fall risk. Target:  06-24-16   Baseline .56 ft/sec with cane with L Foot up brace on 04-25-16   Time 8   Period Weeks   Status New     PT LONG TERM GOAL #3   Title Pt will amb. 360' with SPC with S for incr. community ambulation.  (06-24-16)   Baseline 100' with CGA on 04-25-16   Time 8   Period Weeks   Status New     PT LONG TERM GOAL #4   Title Negotiate 4 steps with R hand rail using step by step sequence with S.  (06-24-16)   Baseline Step negotiation TBA   Time 8   Period Weeks   Status New     PT LONG TERM GOAL #5   Title Independent in updated HEP as appropriate.  (06-24-16)   Time 8   Period Weeks   Status New  Plan - 06/03/16 2047    Clinical Impression Statement Pt did not have Botox on 05-17-16 - states she is scheduled  to receive Botox in December.  Pt using Foot up brace on LLE, however, pt continues to have L genu recurvatum with decreased foot clearance due to weak dorsiflexors.  Clinic's demo AFO's (Ottobocks) were too large and did not fit in pt's shoe for trial:  heel wedge only used in L shoe to assist in reducing genu recurvatum but did not appear to significantly reduce hyperextension of L knee in stance. Will pursue obtaining orthotic consult.     Rehab Potential Fair   Clinical Impairments Affecting Rehab Potential severity of impairments and length of time since CVA   PT Frequency 2x / week   PT Duration 8 weeks   PT Treatment/Interventions ADLs/Self Care Home Management;Gait training;DME Instruction;Functional mobility training;Stair training;Therapeutic activities;Therapeutic exercise;Balance training;Neuromuscular re-education;Patient/family education;Orthotic Fit/Training;Passive range of motion   PT Next Visit Plan begin HEP - heel cord stretch in standing, LLE strengthening (supine);   PT Home Exercise Plan LLE strengthening and stretching   Consulted and Agree with Plan of Care Patient      Patient will benefit from skilled therapeutic intervention in order to improve the following deficits and impairments:  Abnormal gait, Decreased activity tolerance, Decreased balance, Decreased cognition, Decreased coordination, Decreased range of motion, Decreased strength, Impaired tone, Impaired flexibility, Impaired sensation, Postural dysfunction, Impaired UE functional use, Pain  Visit Diagnosis: Other abnormalities of gait and mobility  Muscle weakness (generalized)     Problem List Patient Active Problem List   Diagnosis Date Noted  . HTN (hypertension) 07/26/2015  . Obesity (BMI 30.0-34.9) 07/26/2015  . Syncope 12/06/2014  . Spastic hemiplegia affecting nondominant side ([place]) 07/25/2014  . 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 Left hemiparesis  ([place]) 06/10/2014  . Left-sided neglect 06/10/2014  . 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 Acute respiratory failure with hypoxia ([place]) 06/05/2014  . Cerebral infarction due to occlusion of right middle cerebral artery ([place]) 06/03/2014  . Cerebral infarct ([place]) 06/03/2014  . Altered mental status 06/03/2014  . GERD (gastroesophageal reflux disease) 04/12/2014  . Dyslipidemia- statin intol 02/07/2014  . Chest pain- Low risk Myoview July 2015 02/06/2014    Alda Lea, PT 06/03/2016, 8:59 PM  [facility] [address] [place], [place], [address] Phone: [phone]   Fax:  [phone]  Name: Mary Le MRN: [id number] Date of Birth: 29-Feb-1946

## 2016-06-06 ENCOUNTER — Ambulatory Visit: Payer: Medicare Other | Admitting: Physical Therapy

## 2016-06-10 ENCOUNTER — Ambulatory Visit: Payer: Medicare Other | Admitting: Physical Therapy

## 2016-06-12 ENCOUNTER — Ambulatory Visit: Payer: Medicare Other | Admitting: Physical Therapy

## 2016-06-17 ENCOUNTER — Ambulatory Visit: Payer: Medicare Other | Admitting: Physical Therapy

## 2016-06-19 ENCOUNTER — Other Ambulatory Visit: Payer: Self-pay

## 2016-06-19 DIAGNOSIS — I10 Essential (primary) hypertension: Secondary | ICD-10-CM

## 2016-06-19 NOTE — Telephone Encounter (Signed)
Patient's daughter is calling on behalf of patient.  She states that her mother's medication is about to be out and there are no more refills available.  She states that the patient has a balance with [place] and cannot be seen until she pays on it.  She is wondering if she could get a refill for Tropol-XL until she can come in and be reevaluated.  She also wants to change the pharmacy to the [facility] on [facility].  Please advise!  Patient's [id number]

## 2016-06-20 ENCOUNTER — Ambulatory Visit: Payer: Medicare Other | Admitting: Family Medicine

## 2016-06-20 ENCOUNTER — Ambulatory Visit: Payer: Medicare Other | Admitting: Physical Therapy

## 2016-06-20 MED ORDER — METOPROLOL SUCCINATE ER 25 MG PO TB24: 12.5000 mg | ORAL_TABLET | Freq: Every day | ORAL | 0 refills | Status: DC

## 2016-06-20 NOTE — Telephone Encounter (Signed)
Last ov 11/2015  Spoke with daughter  And she will make an appt, at the end of December as they get paid mid December.

## 2016-06-24 ENCOUNTER — Ambulatory Visit: Payer: Medicare Other | Attending: Physical Medicine & Rehabilitation | Admitting: Physical Therapy

## 2016-06-24 DIAGNOSIS — M6281 Muscle weakness (generalized): Secondary | ICD-10-CM | POA: Insufficient documentation

## 2016-06-24 DIAGNOSIS — R2689 Other abnormalities of gait and mobility: Secondary | ICD-10-CM | POA: Diagnosis not present

## 2016-06-24 NOTE — Therapy (Signed)
[facility] [address] [place], [place], [address] Phone: [phone]   Fax:  [phone]  Physical Therapy Treatment  Patient Details  Name: Mary Le MRN: [id number] Date of Birth: 08-25-1945 Referring Provider: Dr. Alysia Penna  Encounter Date: 06/24/2016      PT End of Session - 06/24/16 1721    Visit Number 4   Number of Visits 17      Past Medical History:  Diagnosis Date  . Back pain    arthritis  . CVA (cerebral infarction)   . Fall 11/2014   fell in bathroom, uses a cane  . Hypercholesteremia   . Hypertension   . Joint pain    back, history of   . Stroke ([place])   . Weakness     Past Surgical History:  Procedure Laterality Date  . APPENDECTOMY    . HEMORROIDECTOMY    . LOOP RECORDER IMPLANT  06/07/2014   MDT LINQ implanted by Dr [name] for cryptogenic stroke  . LOOP RECORDER IMPLANT N/A 06/07/2014   Procedure: LOOP RECORDER IMPLANT;  Surgeon: [name], MD;  Location: [facility] CATH LAB;  Service: Cardiovascular;  Laterality: N/A;  . RADIOLOGY WITH ANESTHESIA N/A 06/03/2014   Procedure: RADIOLOGY WITH ANESTHESIA;  Surgeon: [name], MD;  Location: [facility];  Service: Radiology;  Laterality: N/A;  . TEE WITHOUT CARDIOVERSION N/A 06/07/2014   Procedure: TRANSESOPHAGEAL ECHOCARDIOGRAM (TEE);  Surgeon: [name], MD;  Location: [facility] ENDOSCOPY;  Service: Cardiovascular;  Laterality: N/A;  . TONSILLECTOMY    . TUBAL LIGATION      There were no vitals filed for this visit.      Subjective Assessment - 06/24/16 1715    Subjective Pt reports she is feeling better: is accompanied to PT by her daughter   Patient is accompained by: Family member   Pertinent History loop recorder implant 06-07-14:  syncope:  lumbar spondylosis:  HTN:  cerebral infarct; acute respiratory failure with hypoxia   Patient Stated Goals "regain strength that I've lost"     "be able to walk without having to  look down"   Currently in Pain? No/denies                         [facility] Adult PT Treatment/Exercise - 06/24/16 1349      Transfers   Transfers Sit to Stand   Sit to Stand 5: Supervision     Ambulation/Gait   Ambulation/Gait Yes   Ambulation/Gait Assistance 4: Min guard   Ambulation Distance (Feet) 120 Feet  25', 100' from SciFit to lobby at end of session   Assistive device Straight cane   Gait Pattern Decreased hip/knee flexion - left;Decreased step length - left   Ambulation Surface Level;Indoor   Gait Comments Ottobock AFO (walk on) trialed on LLE - had to use daughter's shoe so that AFO would fit as it would not fit inside pt's shoe size 8 [employer]     Lumbar Exercises: Supine   Heel Slides 10 reps  LLE   Bridge 10 reps   Straight Leg Raise 10 reps   Other Supine Lumbar Exercises L hip extension control exercise 10 reps off side of hi/lo mat table   Other Supine Lumbar Exercises L hip abduction in hooklying position 10 reps with minimal manual resistance     Pt has purchased tripod based tip for her cane which she states significantly helps with her gait pattern and  gives her more  Stability/confidence Pt rode SciFit level 1.5 x 5" with UE's and LE's  Pt continues to wear foot up brace on LLE - will contact orthotist for consult for AFO for LLE           PT Short Term Goals - 06/03/16 2052      PT SHORT TERM GOAL #1   Title Increase TUG score to </= 34 secs with SPC to demo improved functional mobility.  (05-26-16)   Baseline 42.94 secs with SPC   Status On-going     PT SHORT TERM GOAL #2   Title Complete Berg balance test and establish goal as appropriate.  (05-26-16)  Berg score >/= 36/56  (set by [id number], PT on 06-03-16)   Baseline 05/15/16: 32/56 baseline value established today- PT to set goals as appropriate.     PT SHORT TERM GOAL #3   Title Increase gait speed from .56 ft/sec to >/= .90 ft/sec with SPC for incr. gait efficiency.  (05-26-16)    Baseline .53 ft/sec with SPC on 04-25-16     PT SHORT TERM GOAL #4   Title Pt will transfer sit to stand from 18" mat surface with RUE with min assist.  (05-26-16)   Baseline RUE required with mod assist - 04-25-16     PT SHORT TERM GOAL #5   Title Independent in HEP for LLE strengthening and stretching.  (05-26-16)   Status On-going           PT Long Term Goals - 06/24/16 1722      PT LONG TERM GOAL #1   Title Increase Berg score by at least 5 points to demo improvement in standing balance.  (06-24-16) Increase Berg to >/= 35/56; 07-25-16  goal revised due to inconsistent attendance: Pt has only attended 4 sessions within past 8 weeks; Continue LTG's with #1 revised due to lack of attendance                                           Time 4   Period Weeks   Status Revised     PT LONG TERM GOAL #2   Title Pt will improve gait speed from .56 ft/sec to 1.2 ft/sec with cane to indicate decreased fall risk. Target:  06-24-16/ 07-25-16   Baseline .56 ft/sec with cane with L Foot up brace on 04-25-16   Time 4   Period Weeks   Status On-going     PT LONG TERM GOAL #3   Title Pt will amb. 360' with SPC with S for incr. community ambulation.  (06-24-16); Revise to 200' with AFO with cane (07-26-16)   Baseline 100' with CGA on 04-25-16   Time 4   Period Weeks   Status Revised     PT LONG TERM GOAL #4   Title Negotiate 4 steps with R hand rail using step by step sequence with S.  (06-24-16)/ 07-26-16   Baseline Step negotiation TBA   Time 4   Period Weeks   Status On-going     PT LONG TERM GOAL #5   Title Independent in updated HEP as appropriate.  (06-24-16)/ 07-26-16   Time 4   Period Weeks   Status On-going             Patient will benefit from skilled therapeutic intervention in order to improve the following deficits and  impairments:     Visit Diagnosis: Other abnormalities of gait and mobility - Plan: PT plan of care cert/re-cert  Muscle weakness (generalized) - Plan: PT plan  of care cert/re-cert       G-Codes - July 09, 2016 1728    Functional Assessment Tool Used TUG score 42.94 secs with SPC with Foot Up brace:  Gait velocity .17 ft/sec with SPC   Functional Limitation Mobility: Walking and moving around   Mobility: Walking and Moving Around Current Status [phone]) At least 80 percent but less than 100 percent impaired, limited or restricted   Mobility: Walking and Moving Around Goal Status [phone]) At least 60 percent but less than 80 percent impaired, limited or restricted      Problem List Patient Active Problem List   Diagnosis Date Noted  . HTN (hypertension) 07/26/2015  . Obesity (BMI 30.0-34.9) 07/26/2015  . Syncope 12/06/2014  . Spastic hemiplegia affecting nondominant side ([name]) 07/25/2014  . 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 Left hemiparesis ([place]) 06/10/2014  . Left-sided neglect 06/10/2014  . Thrombotic stroke involving right middle cerebral artery ([place]) 06/08/2014  . Acute respiratory failure with hypoxia ([place]) 06/05/2014  . Cerebral infarction due to occlusion of right middle cerebral artery ([place]) 06/03/2014  . Cerebral infarct ([place]) 06/03/2014  . Altered mental status 06/03/2014  . GERD (gastroesophageal reflux disease) 04/12/2014  . Dyslipidemia- statin intol 02/07/2014  . Chest pain- Low risk Myoview July 2015 02/06/2014    Alda Lea, PT 2016-07-09, 5:44 PM  [facility] [address] [place], [place], [address] Phone: [phone]   Fax:  [phone]  Name: Mary Le MRN: [id number] Date of Birth: December 22, 1945

## 2016-06-26 ENCOUNTER — Ambulatory Visit (INDEPENDENT_AMBULATORY_CARE_PROVIDER_SITE_OTHER): Payer: Medicare Other | Admitting: *Deleted

## 2016-06-26 DIAGNOSIS — I63411 Cerebral infarction due to embolism of right middle cerebral artery: Secondary | ICD-10-CM

## 2016-06-27 ENCOUNTER — Ambulatory Visit: Payer: Medicare Other | Admitting: Physical Therapy

## 2016-06-27 DIAGNOSIS — M6281 Muscle weakness (generalized): Secondary | ICD-10-CM

## 2016-06-27 DIAGNOSIS — R2689 Other abnormalities of gait and mobility: Secondary | ICD-10-CM

## 2016-06-27 NOTE — Progress Notes (Signed)
[facility] Summary Report / Loop Recorder 

## 2016-06-28 NOTE — Therapy (Signed)
[facility] [address] [place], [place], [address] Phone: [phone]   Fax:  [phone]  Physical Therapy Treatment  Patient Details  Name: Mary Le MRN: [id number] Date of Birth: Dec 17, 1945 Referring Provider: Dr. Alysia Penna  Encounter Date: 06/27/2016      PT End of Session - 06/28/16 1002    Visit Number 5   Number of Visits 17   Date for PT Re-Evaluation 07/24/16   Authorization Type Medicare   Authorization Time Period 06-24-16 - 08-23-16   PT Start Time 1315   PT Stop Time 1401   PT Time Calculation (min) 46 min      Past Medical History:  Diagnosis Date  . Back pain    arthritis  . CVA (cerebral infarction)   . Fall 11/2014   fell in bathroom, uses a cane  . Hypercholesteremia   . Hypertension   . Joint pain    back, history of   . Stroke ([place])   . Weakness     Past Surgical History:  Procedure Laterality Date  . APPENDECTOMY    . HEMORROIDECTOMY    . LOOP RECORDER IMPLANT  06/07/2014   MDT LINQ implanted by Dr [name] for cryptogenic stroke  . LOOP RECORDER IMPLANT N/A 06/07/2014   Procedure: LOOP RECORDER IMPLANT;  Surgeon: [name], MD;  Location: [facility];  Service: Cardiovascular;  Laterality: N/A;  . RADIOLOGY WITH ANESTHESIA N/A 06/03/2014   Procedure: RADIOLOGY WITH ANESTHESIA;  Surgeon: [name], MD;  Location: [facility];  Service: Radiology;  Laterality: N/A;  . TEE WITHOUT CARDIOVERSION N/A 06/07/2014   Procedure: TRANSESOPHAGEAL ECHOCARDIOGRAM (TEE);  Surgeon: [name], MD;  Location: [facility] ENDOSCOPY;  Service: Cardiovascular;  Laterality: N/A;  . TONSILLECTOMY    . TUBAL LIGATION      There were no vitals filed for this visit.      Subjective Assessment - 06/28/16 0952    Subjective Pt reports she was really tired after PT session on Monday - slept really late on Tuesday   Patient is accompained by: Family member   Pertinent History loop  recorder implant 06-07-14:  syncope:  lumbar spondylosis:  HTN:  cerebral infarct; acute respiratory failure with hypoxia   Patient Stated Goals "regain strength that I've lost"     "be able to walk without having to look down"   Currently in Pain? No/denies                         [facility] Adult PT Treatment/Exercise - 06/28/16 0001      Transfers   Transfers Sit to Stand   Sit to Stand 5: Supervision   Stand to Sit 5: Supervision;With upper extremity assist;With armrests;To bed;To chair/3-in-1   Number of Reps Other reps (comment)  5   Comments no UE support used - needed much momentum     Ambulation/Gait   Ambulation/Gait Yes   Ambulation/Gait Assistance 4: Min guard   Ambulation Distance (Feet) 50 Feet   Assistive device Straight cane   Gait Pattern Decreased hip/knee flexion - left;Decreased step length - left   Ambulation Surface Level;Indoor     Lumbar Exercises: Supine   Heel Slides 10 reps   Bridge 10 reps   Straight Leg Raise 10 reps   Other Supine Lumbar Exercises L hip extension control exercise 10 reps off side of hi/lo mat table   Other Supine Lumbar Exercises L hip abduction  in hooklying position 10 reps with minimal manual resistance; also L hip abduction in supine x 10 reps due to pt reporting R hip pain in R sidelying position     Knee/Hip Exercises: Aerobic   Recumbent Bike SciFit level 1.5 with UE and LE's  cues to keep LLE adducted                  PT Short Term Goals - 06/03/16 2052      PT SHORT TERM GOAL #1   Title Increase TUG score to </= 34 secs with SPC to demo improved functional mobility.  (05-26-16)   Baseline 42.94 secs with SPC   Status On-going     PT SHORT TERM GOAL #2   Title Complete Berg balance test and establish goal as appropriate.  (05-26-16)  Berg score >/= 36/56  (set by [id number], PT on 06-03-16)   Baseline 05/15/16: 32/56 baseline value established today- PT to set goals as appropriate.     PT SHORT TERM  GOAL #3   Title Increase gait speed from .56 ft/sec to >/= .90 ft/sec with SPC for incr. gait efficiency.  (05-26-16)   Baseline .87 ft/sec with SPC on 04-25-16     PT SHORT TERM GOAL #4   Title Pt will transfer sit to stand from 18" mat surface with RUE with min assist.  (05-26-16)   Baseline RUE required with mod assist - 04-25-16     PT SHORT TERM GOAL #5   Title Independent in HEP for LLE strengthening and stretching.  (05-26-16)   Status On-going           PT Long Term Goals - 06/24/16 1722      PT LONG TERM GOAL #1   Title Increase Berg score by at least 5 points to demo improvement in standing balance.  (06-24-16) Increase Berg to >/= 35/56; 07-25-16  goal revised due to inconsistent attendance: Pt has only attended 4 sessions within past 8 weeks; Continue LTG's with #1 revised due to lack of attendance                                           Time 4   Period Weeks   Status Revised     PT LONG TERM GOAL #2   Title Pt will improve gait speed from .56 ft/sec to 1.2 ft/sec with cane to indicate decreased fall risk. Target:  06-24-16/ 07-25-16   Baseline .56 ft/sec with cane with L Foot up brace on 04-25-16   Time 4   Period Weeks   Status On-going     PT LONG TERM GOAL #3   Title Pt will amb. 360' with SPC with S for incr. community ambulation.  (06-24-16); Revise to 200' with AFO with cane (07-26-16)   Baseline 100' with CGA on 04-25-16   Time 4   Period Weeks   Status Revised     PT LONG TERM GOAL #4   Title Negotiate 4 steps with R hand rail using step by step sequence with S.  (06-24-16)/ 07-26-16   Baseline Step negotiation TBA   Time 4   Period Weeks   Status On-going     PT LONG TERM GOAL #5   Title Independent in updated HEP as appropriate.  (06-24-16)/ 07-26-16   Time 4   Period Weeks   Status On-going  Plan - 06/28/16 0958    Clinical Impression Statement Pt progressing slowly but steadily towards goals - pt needs AFO to increase L dorsiflexion and  to control L genu recurvatum in stance   Rehab Potential Fair   Clinical Impairments Affecting Rehab Potential severity of impairments and length of time since CVA   PT Frequency 2x / week   PT Duration 8 weeks   PT Treatment/Interventions ADLs/Self Care Home Management;Gait training;DME Instruction;Functional mobility training;Stair training;Therapeutic activities;Therapeutic exercise;Balance training;Neuromuscular re-education;Patient/family education;Orthotic Fit/Training;Passive range of motion   PT Next Visit Plan begin HEP - heel cord stretch in standing, LLE strengthening (supine);   PT Home Exercise Plan LLE strengthening and stretching   Recommended Other Services contacted Gerald Stabs at [facility] for AFO consult   Consulted and Agree with Plan of Care Patient;Family member/caregiver   Family Member Consulted daughter      Patient will benefit from skilled therapeutic intervention in order to improve the following deficits and impairments:  Abnormal gait, Decreased activity tolerance, Decreased balance, Decreased cognition, Decreased coordination, Decreased range of motion, Decreased strength, Impaired tone, Impaired flexibility, Impaired sensation, Postural dysfunction, Impaired UE functional use, Pain  Visit Diagnosis: Other abnormalities of gait and mobility  Muscle weakness (generalized)     Problem List Patient Active Problem List   Diagnosis Date Noted  . HTN (hypertension) 07/26/2015  . Obesity (BMI 30.0-34.9) 07/26/2015  . Syncope 12/06/2014  . Spastic hemiplegia affecting nondominant side ([place]) 07/25/2014  . 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 Left hemiparesis ([place]) 06/10/2014  . Left-sided neglect 06/10/2014  . Thrombotic stroke involving right middle cerebral artery ([place]) 06/08/2014  . Acute respiratory failure with hypoxia ([place]) 06/05/2014  . Cerebral infarction due to occlusion of right middle cerebral artery ([place])  06/03/2014  . Cerebral infarct ([place]) 06/03/2014  . Altered mental status 06/03/2014  . GERD (gastroesophageal reflux disease) 04/12/2014  . Dyslipidemia- statin intol 02/07/2014  . Chest pain- Low risk Myoview July 2015 02/06/2014    Alda Lea, PT 06/28/2016, 10:03 AM  [facility] [address] [place], [place], [address] Phone: [phone]   Fax:  [phone]  Name: Mary Le MRN: [id number] Date of Birth: 10-Nov-1945

## 2016-07-01 ENCOUNTER — Ambulatory Visit: Payer: Medicare Other | Admitting: Physical Therapy

## 2016-07-01 DIAGNOSIS — R2689 Other abnormalities of gait and mobility: Secondary | ICD-10-CM | POA: Diagnosis not present

## 2016-07-01 DIAGNOSIS — M6281 Muscle weakness (generalized): Secondary | ICD-10-CM | POA: Diagnosis not present

## 2016-07-02 NOTE — Therapy (Signed)
[facility] Adult PT Treatment/Exercise - 06/28/16 0001      Transfers   Transfers Sit to Stand   Sit to Stand 5: Supervision   Stand to Sit 5: Supervision;With upper extremity assist;With armrests;To bed;To chair/3-in-1   Number of Reps Other reps (comment)  5   Comments no UE support used - needed much momentum     Ambulation/Gait   Ambulation/Gait Yes   Ambulation/Gait Assistance 4: Min guard   Ambulation Distance (Feet) 50 Feet   Assistive device Straight cane   Gait Pattern Decreased hip/knee flexion - left;Decreased step length - left   Ambulation Surface Level;Indoor     Lumbar Exercises: Supine   Heel Slides 10 reps   Bridge 10 reps   Straight Leg Raise 10 reps   Other Supine Lumbar Exercises L hip extension control exercise 10 reps off side of hi/lo mat table   Other Supine Lumbar Exercises L hip abduction in hooklying position 10 reps with minimal manual resistance; also L hip abduction in supine x 10 reps due to pt reporting R hip pain in R sidelying position     Knee/Hip Exercises: Aerobic   Recumbent Bike SciFit level 1.5 with UE and LE's  cues to keep LLE adducted           [facility] [address] [place], [place], [address] Phone: [phone]   Fax:  [phone]  Physical Therapy Treatment  Patient Details  Name: Mary Le MRN: [id number] Date of Birth: May 20, 1946 Referring Provider: Dr. Alysia Penna  Encounter Date: 07/01/2016      PT End of Session - 07/02/16 2042    Visit Number 6   Number of Visits 17   Date for PT Re-Evaluation 07/24/16   Authorization Type Medicare   Authorization Time Period 06-24-16 - 08-23-16   PT Start Time 1316   PT Stop Time 1402   PT Time Calculation (min) 46 min      Past Medical History:  Diagnosis Date  . Back pain    arthritis  . CVA (cerebral infarction)   . Fall 11/2014   fell in bathroom, uses a cane  . Hypercholesteremia   .  Hypertension   . Joint pain    back, history of   . Stroke ([place])   . Weakness     Past Surgical History:  Procedure Laterality Date  . APPENDECTOMY    . HEMORROIDECTOMY    . LOOP RECORDER IMPLANT  06/07/2014   MDT LINQ implanted by Dr [name] for cryptogenic stroke  . LOOP RECORDER IMPLANT N/A 06/07/2014   Procedure: LOOP RECORDER IMPLANT;  Surgeon: [name], MD;  Location: [facility] CATH LAB;  Service: Cardiovascular;  Laterality: N/A;  . RADIOLOGY WITH ANESTHESIA N/A 06/03/2014   Procedure: RADIOLOGY WITH ANESTHESIA;  Surgeon: [name], MD;  Location: [facility];  Service: Radiology;  Laterality: N/A;  . TEE WITHOUT CARDIOVERSION N/A 06/07/2014   Procedure: TRANSESOPHAGEAL ECHOCARDIOGRAM (TEE);  Surgeon: [name], MD;  Location: [facility] ENDOSCOPY;  Service: Cardiovascular;  Laterality: N/A;  . TONSILLECTOMY    . TUBAL LIGATION      There were no vitals filed for this visit.      Subjective Assessment - 07/02/16 2041    Subjective Pt reports no changes since last session - states "I'm trying to stand up straight"   Patient is accompained by: Family member   Pertinent History loop recorder implant 06-07-14:  syncope:  lumbar spondylosis:  HTN:  cerebral infarct; acute respiratory failure with hypoxia   Patient Stated Goals "regain strength that I've lost"     "be able to walk without having to look down"   Currently in Pain? No/denies            Transfers Sit to Stand   Sit to Stand 5: Supervision       Number of Reps Other reps (comment)  5   Comments no UE support used - needed much momentum     Ambulation/Gait   Ambulation/Gait Yes   Ambulation/Gait Assistance 4: Min guard   Ambulation Distance (Feet) 50 Feet   Assistive device Straight cane   Gait Pattern Decreased hip/knee flexion - left;Decreased step length - left   Ambulation Surface Level;Indoor     Lumbar Exercises: Supine   Heel Slides 10 reps   Bridge 10 reps   Straight Leg Raise 10 reps    Other Supine Lumbar Exercises L hip extension control exercise 10 reps off side of hi/lo mat table   Other Supine Lumbar Exercises L hip abduction in hooklying position 10 reps with minimal manual resistance; also L hip abduction in supine x 10 reps due to pt reporting R hip pain in R sidelying position     Knee/Hip Exercises: Aerobic   Recumbent Bike SciFit level 1.5 with UE and LE's  cues to keep LLE adducted                                         PT Short Term Goals - 06/03/16 2052      PT SHORT TERM GOAL #1   Title Increase TUG score to </= 34 secs with SPC to demo improved functional mobility.  (05-26-16)   Baseline 42.94 secs with SPC   Status On-going     PT SHORT TERM GOAL #2   Title Complete Berg balance test and establish goal as appropriate.  (05-26-16)  Berg score >/= 36/56  (set by [id number], PT on 06-03-16)   Baseline 05/15/16: 32/56 baseline value established today- PT to set goals as appropriate.     PT SHORT TERM GOAL #3   Title Increase gait speed from .56 ft/sec to >/= .90 ft/sec with SPC for incr. gait efficiency.  (05-26-16)   Baseline .16 ft/sec with SPC on 04-25-16     PT SHORT TERM GOAL #4   Title Pt will transfer sit to stand from 18" mat surface with RUE with min assist.  (05-26-16)   Baseline RUE required with mod assist - 04-25-16     PT SHORT TERM GOAL #5   Title Independent in HEP for LLE strengthening and stretching.  (05-26-16)   Status On-going           PT Long Term Goals - 06/24/16 1722      PT LONG TERM GOAL #1   Title Increase Berg score by at least 5 points to demo improvement in standing balance.  (06-24-16) Increase Berg to >/= 35/56; 07-25-16  goal revised due to inconsistent attendance: Pt has only attended 4 sessions within past 8 weeks; Continue LTG's with #1 revised due to lack of attendance                                           Time  4   Period Weeks   Status Revised     PT LONG TERM GOAL #2   Title  Pt will improve gait speed from .56 ft/sec to 1.2 ft/sec with cane to indicate decreased fall risk. Target:  06-24-16/ 07-25-16   Baseline .56 ft/sec with cane with L Foot up brace on 04-25-16   Time 4   Period Weeks   Status On-going     PT LONG TERM GOAL #3   Title Pt will amb. 360' with SPC with S for incr. community ambulation.  (06-24-16); Revise to 200' with AFO with cane (07-26-16)   Baseline 100' with CGA on 04-25-16   Time 4   Period Weeks   Status Revised     PT LONG TERM GOAL #4   Title Negotiate 4 steps with R hand rail using step by step sequence with S.  (06-24-16)/ 07-26-16   Baseline Step negotiation TBA   Time 4   Period Weeks   Status On-going     PT LONG TERM GOAL #5   Title Independent in updated HEP as appropriate.  (06-24-16)/ 07-26-16   Time 4   Period Weeks   Status On-going               Plan - 07/02/16 2043    Clinical Impression Statement Pt progressing with improving upright posture; pt has significant L heel cord tightness which limits L dorsiflexion ROM; continues to have decreased foot clearance, especially with fatigue    Rehab Potential Fair   Clinical Impairments Affecting Rehab Potential severity of impairments and length of time since CVA   PT Frequency 2x / week   PT Duration 8 weeks   PT Treatment/Interventions ADLs/Self Care Home Management;Gait training;DME Instruction;Functional mobility training;Stair training;Therapeutic activities;Therapeutic exercise;Balance training;Neuromuscular re-education;Patient/family education;Orthotic Fit/Training;Passive range of motion   PT Next Visit Plan AFO consult with [facility] Gerald Stabs)   PT Home Exercise Plan LLE strengthening and stretching   Consulted and Agree with Plan of Care Patient      Patient will benefit from skilled therapeutic intervention in order to improve the following deficits and impairments:  Abnormal gait, Decreased activity tolerance, Decreased balance, Decreased cognition, Decreased  coordination, Decreased range of motion, Decreased strength, Impaired tone, Impaired flexibility, Impaired sensation, Postural dysfunction, Impaired UE functional use, Pain  Visit Diagnosis: Other abnormalities of gait and mobility  Muscle weakness (generalized)     Problem List Patient Active Problem List   Diagnosis Date Noted  . HTN (hypertension) 07/26/2015  . Obesity (BMI 30.0-34.9) 07/26/2015  . Syncope 12/06/2014  . Spastic hemiplegia affecting nondominant side ([place]) 07/25/2014  . 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 Left hemiparesis ([place]) 06/10/2014  . Left-sided neglect 06/10/2014  . Thrombotic stroke involving right middle cerebral artery ([place]) 06/08/2014  . Acute respiratory failure with hypoxia ([place]) 06/05/2014  . Cerebral infarction due to occlusion of right middle cerebral artery ([place]) 06/03/2014  . Cerebral infarct ([place]) 06/03/2014  . Altered mental status 06/03/2014  . GERD (gastroesophageal reflux disease) 04/12/2014  . Dyslipidemia- statin intol 02/07/2014  . Chest pain- Low risk Myoview July 2015 02/06/2014    Alda Lea, PT 07/02/2016, 8:47 PM  [facility] [address] [place], [place], [address] Phone: [phone]   Fax:  [phone]  Name: Mary Le MRN: [id number] Date of Birth: Nov 03, 1945

## 2016-07-04 ENCOUNTER — Encounter: Payer: Medicare Other | Attending: Physical Medicine & Rehabilitation

## 2016-07-04 ENCOUNTER — Encounter: Payer: Self-pay | Admitting: Physical Medicine & Rehabilitation

## 2016-07-04 ENCOUNTER — Ambulatory Visit (HOSPITAL_BASED_OUTPATIENT_CLINIC_OR_DEPARTMENT_OTHER): Payer: Medicare Other | Admitting: Physical Medicine & Rehabilitation

## 2016-07-04 ENCOUNTER — Ambulatory Visit: Payer: Medicare Other | Admitting: Physical Therapy

## 2016-07-04 VITALS — BP 143/82 | HR 91

## 2016-07-04 DIAGNOSIS — R2689 Other abnormalities of gait and mobility: Secondary | ICD-10-CM

## 2016-07-04 DIAGNOSIS — Z87891 Personal history of nicotine dependence: Secondary | ICD-10-CM | POA: Diagnosis not present

## 2016-07-04 DIAGNOSIS — I69354 Hemiplegia and hemiparesis following cerebral infarction affecting left non-dominant side: Secondary | ICD-10-CM | POA: Insufficient documentation

## 2016-07-04 DIAGNOSIS — G8114 Spastic hemiplegia affecting left nondominant side: Secondary | ICD-10-CM | POA: Diagnosis not present

## 2016-07-04 DIAGNOSIS — M6281 Muscle weakness (generalized): Secondary | ICD-10-CM | POA: Diagnosis not present

## 2016-07-04 DIAGNOSIS — E785 Hyperlipidemia, unspecified: Secondary | ICD-10-CM | POA: Insufficient documentation

## 2016-07-04 DIAGNOSIS — G8110 Spastic hemiplegia affecting unspecified side: Secondary | ICD-10-CM

## 2016-07-04 NOTE — Therapy (Signed)
[facility] [address] [place], [place], [address] Phone: [phone]   Fax:  [phone]  Physical Therapy Treatment  Patient Details  Name: Mary Le MRN: [id number] Date of Birth: Feb 15, 1946 Referring Provider: Dr. Alysia Penna  Encounter Date: 07/04/2016      PT End of Session - 07/04/16 2004    Visit Number 7   Number of Visits 17   Date for PT Re-Evaluation 07/24/16   Authorization Type Medicare   Authorization Time Period 06-24-16 - 08-23-16   PT Start Time 1450   PT Stop Time 1531   PT Time Calculation (min) 41 min      Past Medical History:  Diagnosis Date  . Back pain    arthritis  . CVA (cerebral infarction)   . Fall 11/2014   fell in bathroom, uses a cane  . Hypercholesteremia   . Hypertension   . Joint pain    back, history of   . Stroke ([place])   . Weakness     Past Surgical History:  Procedure Laterality Date  . APPENDECTOMY    . HEMORROIDECTOMY    . LOOP RECORDER IMPLANT  06/07/2014   MDT LINQ implanted by Dr [name] for cryptogenic stroke  . LOOP RECORDER IMPLANT N/A 06/07/2014   Procedure: LOOP RECORDER IMPLANT;  Surgeon: [name], MD;  Location: [facility] CATH LAB;  Service: Cardiovascular;  Laterality: N/A;  . RADIOLOGY WITH ANESTHESIA N/A 06/03/2014   Procedure: RADIOLOGY WITH ANESTHESIA;  Surgeon: [name], MD;  Location: [facility];  Service: Radiology;  Laterality: N/A;  . TEE WITHOUT CARDIOVERSION N/A 06/07/2014   Procedure: TRANSESOPHAGEAL ECHOCARDIOGRAM (TEE);  Surgeon: [name], MD;  Location: [facility] ENDOSCOPY;  Service: Cardiovascular;  Laterality: N/A;  . TONSILLECTOMY    . TUBAL LIGATION      There were no vitals filed for this visit.      Subjective Assessment - 07/04/16 1957    Subjective Pt states she just received Botox in her L arm and leg this morning at Dr. Dianna Limbo office - daughter states 1000 units in L arm and 500 units in L leg; pt reports  she is tired this pm due to having had a busy day   Patient is accompained by: Family member   Pertinent History loop recorder implant 06-07-14:  syncope:  lumbar spondylosis:  HTN:  cerebral infarct; acute respiratory failure with hypoxia   Patient Stated Goals "regain strength that I've lost"     "be able to walk without having to look down"                         [facility] Adult PT Treatment/Exercise - 07/04/16 1535      Ambulation/Gait   Ambulation/Gait Yes   Ambulation/Gait Assistance 4: Min guard   Ambulation/Gait Assistance Details consult for AFO for LLE with Brooke Pace from [place]:  Pt gait trained with use of [place] AFO (posterior) ; pt amb. apporx. 30 ' with Foot Up brace with SPC prior to rest period needed:  AFO donned on LLE and pt amb. rest of lap, approx. 67'  with SPC with SBA to CGA for gait assessment   Assistive device Straight cane   Gait Pattern Decreased hip/knee flexion - left;Decreased step length - left   Ambulation Surface Level;Indoor   Gait Comments Walk On Ott[place]ock AFO used on LLE      Pt gait trained approx. 10' with SPC with  AFO for 3rd amb. Rep, became fatigued and requested to sit down and rest           PT Education - 07/04/16 2003    Education provided Yes   Education Details benefits of AFO discussed with pt and daughter   Person(s) Educated Patient   Methods Explanation   Comprehension Verbalized understanding          PT Short Term Goals - 06/03/16 2052      PT SHORT TERM GOAL #1   Title Increase TUG score to </= 34 secs with SPC to demo improved functional mobility.  (05-26-16)   Baseline 42.94 secs with SPC   Status On-going     PT SHORT TERM GOAL #2   Title Complete Berg balance test and establish goal as appropriate.  (05-26-16)  Berg score >/= 36/56  (set by [id number], PT on 06-03-16)   Baseline 05/15/16: 32/56 baseline value established today- PT to set goals as appropriate.     PT SHORT TERM GOAL #3   Title  Increase gait speed from .56 ft/sec to >/= .90 ft/sec with SPC for incr. gait efficiency.  (05-26-16)   Baseline .64 ft/sec with SPC on 04-25-16     PT SHORT TERM GOAL #4   Title Pt will transfer sit to stand from 18" mat surface with RUE with min assist.  (05-26-16)   Baseline RUE required with mod assist - 04-25-16     PT SHORT TERM GOAL #5   Title Independent in HEP for LLE strengthening and stretching.  (05-26-16)   Status On-going           PT Long Term Goals - 06/24/16 1722      PT LONG TERM GOAL #1   Title Increase Berg score by at least 5 points to demo improvement in standing balance.  (06-24-16) Increase Berg to >/= 35/56; 07-25-16  goal revised due to inconsistent attendance: Pt has only attended 4 sessions within past 8 weeks; Continue LTG's with #1 revised due to lack of attendance                                           Time 4   Period Weeks   Status Revised     PT LONG TERM GOAL #2   Title Pt will improve gait speed from .56 ft/sec to 1.2 ft/sec with cane to indicate decreased fall risk. Target:  06-24-16/ 07-25-16   Baseline .56 ft/sec with cane with L Foot up brace on 04-25-16   Time 4   Period Weeks   Status On-going     PT LONG TERM GOAL #3   Title Pt will amb. 360' with SPC with S for incr. community ambulation.  (06-24-16); Revise to 200' with AFO with cane (07-26-16)   Baseline 100' with CGA on 04-25-16   Time 4   Period Weeks   Status Revised     PT LONG TERM GOAL #4   Title Negotiate 4 steps with R hand rail using step by step sequence with S.  (06-24-16)/ 07-26-16   Baseline Step negotiation TBA   Time 4   Period Weeks   Status On-going     PT LONG TERM GOAL #5   Title Independent in updated HEP as appropriate.  (06-24-16)/ 07-26-16   Time 4   Period Weeks   Status On-going  Plan - 07/04/16 2005    Clinical Impression Statement Ottobock AFO significantly improved gait with providing increased foot clearance of LLE compared to that with  use of Foot Up brace on LLE   Rehab Potential Fair   Clinical Impairments Affecting Rehab Potential severity of impairments and length of time since CVA   PT Frequency 2x / week   PT Duration 8 weeks   PT Treatment/Interventions ADLs/Self Care Home Management;Gait training;DME Instruction;Functional mobility training;Stair training;Therapeutic activities;Therapeutic exercise;Balance training;Neuromuscular re-education;Patient/family education;Orthotic Fit/Training;Passive range of motion   PT Next Visit Plan continue LLE strengthening and balance   PT Home Exercise Plan LLE strengthening and stretching   Recommended Other Services inbasket message to request AFO sent to Dr. Letta Pate today   Family Member Consulted daughter      Patient will benefit from skilled therapeutic intervention in order to improve the following deficits and impairments:  Abnormal gait, Decreased activity tolerance, Decreased balance, Decreased cognition, Decreased coordination, Decreased range of motion, Decreased strength, Impaired tone, Impaired flexibility, Impaired sensation, Postural dysfunction, Impaired UE functional use, Pain  Visit Diagnosis: Other abnormalities of gait and mobility     Problem List Patient Active Problem List   Diagnosis Date Noted  . HTN (hypertension) 07/26/2015  . Obesity (BMI 30.0-34.9) 07/26/2015  . Syncope 12/06/2014  . Spastic hemiplegia affecting nondominant side ([place]) 07/25/2014  . 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 Left hemiparesis ([place]) 06/10/2014  . Left-sided neglect 06/10/2014  . Thrombotic stroke involving right middle cerebral artery ([place]) 06/08/2014  . Acute respiratory failure with hypoxia ([place]) 06/05/2014  . Cerebral infarction due to occlusion of right middle cerebral artery ([place]) 06/03/2014  . Cerebral infarct ([place]) 06/03/2014  . Altered mental status 06/03/2014  . GERD (gastroesophageal reflux disease) 04/12/2014   . Dyslipidemia- statin intol 02/07/2014  . Chest pain- Low risk Myoview July 2015 02/06/2014    Alda Lea, PT 07/04/2016, 8:10 PM  [facility] [address] [place], [place], [address] Phone: [phone]   Fax:  [phone]  Name: Golie Simoni MRN: [id number] Date of Birth: [birth year]

## 2016-07-04 NOTE — Patient Instructions (Signed)

## 2016-07-04 NOTE — Progress Notes (Addendum)
Dysport Injection for spasticity using needle EMG guidance  Dilution: 200 Units/ml Indication: Severe spasticity which interferes with ADL,mobility and/or  hygiene and is unresponsive to medication management and other conservative care Informed consent was obtained after describing risks and benefits of the procedure with the patient. This includes bleeding, bruising, infection, excessive weakness, or medication side effects. A REMS form is on file and signed. Needle:25g 16mm  needle electrode Number of units per muscle Left non dom side Biceps 200 Brachial radialis, 200 FDS 200. FDP 200 FPL, 100 Pronator, 100  Soleus 300 Tib post 200  All injections were done after obtaining appropriate EMG activity and after negative drawback for blood. The patient tolerated the procedure well. Post procedure instructions were given. A followup appointment was made.

## 2016-07-08 ENCOUNTER — Ambulatory Visit: Payer: Medicare Other | Admitting: Physical Therapy

## 2016-07-08 DIAGNOSIS — M6281 Muscle weakness (generalized): Secondary | ICD-10-CM | POA: Diagnosis not present

## 2016-07-08 DIAGNOSIS — R2689 Other abnormalities of gait and mobility: Secondary | ICD-10-CM

## 2016-07-08 NOTE — Addendum Note (Signed)
Addended by: Charlett Blake on: 07/08/2016 05:04 PM   Modules accepted: Orders

## 2016-07-08 NOTE — Therapy (Signed)
[facility] [address] [place], [place], [address] Phone: [phone]   Fax:  [phone]  Physical Therapy Treatment  Patient Details  Name: Mary Le MRN: [id number] Date of Birth: 1945-12-18 Referring Provider: Dr. Alysia Penna  Encounter Date: 07/08/2016      PT End of Session - 07/08/16 2110    Visit Number 8   Number of Visits 17   Date for PT Re-Evaluation 07/24/16   Authorization Type Medicare   Authorization Time Period 06-24-16 - 08-23-16   PT Start Time 1401   PT Stop Time 1450   PT Time Calculation (min) 49 min      Past Medical History:  Diagnosis Date  . Back pain    arthritis  . CVA (cerebral infarction)   . Fall 11/2014   fell in bathroom, uses a cane  . Hypercholesteremia   . Hypertension   . Joint pain    back, history of   . Stroke ([place])   . Weakness     Past Surgical History:  Procedure Laterality Date  . APPENDECTOMY    . HEMORROIDECTOMY    . LOOP RECORDER IMPLANT  06/07/2014   MDT LINQ implanted by Dr [name] for cryptogenic stroke  . LOOP RECORDER IMPLANT N/A 06/07/2014   Procedure: LOOP RECORDER IMPLANT;  Surgeon: [name], MD;  Location: [facility] CATH LAB;  Service: Cardiovascular;  Laterality: N/A;  . RADIOLOGY WITH ANESTHESIA N/A 06/03/2014   Procedure: RADIOLOGY WITH ANESTHESIA;  Surgeon: [name], MD;  Location: [facility];  Service: Radiology;  Laterality: N/A;  . TEE WITHOUT CARDIOVERSION N/A 06/07/2014   Procedure: TRANSESOPHAGEAL ECHOCARDIOGRAM (TEE);  Surgeon: [name], MD;  Location: [facility] ENDOSCOPY;  Service: Cardiovascular;  Laterality: N/A;  . TONSILLECTOMY    . TUBAL LIGATION      There were no vitals filed for this visit.      Subjective Assessment - 07/08/16 2103    Subjective Pt states she had a reaction to the Botox (?) shots she received on Thursday - states it was not Botox but some other medication that Dr. Letta Pate said would have  longer lasting effects  Reaction was that she slept almost entire weekend   Patient is accompained by: Family member   Pertinent History loop recorder implant 06-07-14:  syncope:  lumbar spondylosis:  HTN:  cerebral infarct; acute respiratory failure with hypoxia   Patient Stated Goals "regain strength that I've lost"     "be able to walk without having to look down"   Currently in Pain? Other (Comment)                         [facility] Adult PT Treatment/Exercise - 07/08/16 0001      Transfers   Transfers Sit to Stand   Sit to Stand 5: Supervision   Number of Reps Other reps (comment)  5 reps   Comments R foot on balance bubble for incr. LLE weight bearing:  RUE support needed     Ambulation/Gait   Ambulation/Gait Yes   Ambulation/Gait Assistance 4: Min guard   Ambulation Distance (Feet) 125 Feet   Assistive device Straight cane   Gait Pattern Decreased hip/knee flexion - left;Decreased step length - left   Ambulation Surface Level;Indoor     Lumbar Exercises: Supine   Heel Slides 10 reps   Bridge 10 reps   Straight Leg Raise 10 reps   Other Supine Lumbar Exercises L  hip extension control exercise 10 reps off side of hi/lo mat table   Other Supine Lumbar Exercises L hip abduction in hooklying position 10 reps with minimal manual resistance; also L hip abduction in supine x 10 reps due to pt reporting R hip pain in R sidelying position     Knee/Hip Exercises: Aerobic   Recumbent Bike SciFit level 1.5 with UE and LE's x 9"                  PT Short Term Goals - 06/03/16 2052      PT SHORT TERM GOAL #1   Title Increase TUG score to </= 34 secs with SPC to demo improved functional mobility.  (05-26-16)   Baseline 42.94 secs with SPC   Status On-going     PT SHORT TERM GOAL #2   Title Complete Berg balance test and establish goal as appropriate.  (05-26-16)  Berg score >/= 36/56  (set by [id number], PT on 06-03-16)   Baseline 05/15/16: 32/56 baseline value  established today- PT to set goals as appropriate.     PT SHORT TERM GOAL #3   Title Increase gait speed from .56 ft/sec to >/= .90 ft/sec with SPC for incr. gait efficiency.  (05-26-16)   Baseline .62 ft/sec with SPC on 04-25-16     PT SHORT TERM GOAL #4   Title Pt will transfer sit to stand from 18" mat surface with RUE with min assist.  (05-26-16)   Baseline RUE required with mod assist - 04-25-16     PT SHORT TERM GOAL #5   Title Independent in HEP for LLE strengthening and stretching.  (05-26-16)   Status On-going           PT Long Term Goals - 06/24/16 1722      PT LONG TERM GOAL #1   Title Increase Berg score by at least 5 points to demo improvement in standing balance.  (06-24-16) Increase Berg to >/= 35/56; 07-25-16  goal revised due to inconsistent attendance: Pt has only attended 4 sessions within past 8 weeks; Continue LTG's with #1 revised due to lack of attendance                                           Time 4   Period Weeks   Status Revised     PT LONG TERM GOAL #2   Title Pt will improve gait speed from .56 ft/sec to 1.2 ft/sec with cane to indicate decreased fall risk. Target:  06-24-16/ 07-25-16   Baseline .56 ft/sec with cane with L Foot up brace on 04-25-16   Time 4   Period Weeks   Status On-going     PT LONG TERM GOAL #3   Title Pt will amb. 360' with SPC with S for incr. community ambulation.  (06-24-16); Revise to 200' with AFO with cane (07-26-16)   Baseline 100' with CGA on 04-25-16   Time 4   Period Weeks   Status Revised     PT LONG TERM GOAL #4   Title Negotiate 4 steps with R hand rail using step by step sequence with S.  (06-24-16)/ 07-26-16   Baseline Step negotiation TBA   Time 4   Period Weeks   Status On-going     PT LONG TERM GOAL #5   Title Independent in updated HEP as appropriate.  (  06-24-16)/ 07-26-16   Time 4   Period Weeks   Status On-going               Plan - 07/08/16 2110    Clinical Impression Statement Pt reporting L foot  soreness today from trial of [place] AFO on Thursday with it being tight in her shoe - pt unable to tolerate standing heel cord stretch due to c/o discomfort.  Gait velocity remains very slow with increased energy required to assure L foot clearnace in swing.   Rehab Potential Fair   Clinical Impairments Affecting Rehab Potential severity of impairments and length of time since CVA   PT Frequency 2x / week   PT Duration 8 weeks   PT Treatment/Interventions ADLs/Self Care Home Management;Gait training;DME Instruction;Functional mobility training;Stair training;Therapeutic activities;Therapeutic exercise;Balance training;Neuromuscular re-education;Patient/family education;Orthotic Fit/Training;Passive range of motion   PT Next Visit Plan continue LLE strengthening and balance   PT Home Exercise Plan LLE strengthening and stretching   Recommended Other Services called [facility] - they reported they have received order for AFO from Dr. Loman Brooklyn and Agree with Plan of Care Patient      Patient will benefit from skilled therapeutic intervention in order to improve the following deficits and impairments:  Abnormal gait, Decreased activity tolerance, Decreased balance, Decreased cognition, Decreased coordination, Decreased range of motion, Decreased strength, Impaired tone, Impaired flexibility, Impaired sensation, Postural dysfunction, Impaired UE functional use, Pain  Visit Diagnosis: Other abnormalities of gait and mobility  Muscle weakness (generalized)     Problem List Patient Active Problem List   Diagnosis Date Noted  . HTN (hypertension) 07/26/2015  . Obesity (BMI 30.0-34.9) 07/26/2015  . Syncope 12/06/2014  . Spastic hemiplegia affecting nondominant side ([place]) 07/25/2014  . 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 Left hemiparesis ([place]) 06/10/2014  . Left-sided neglect 06/10/2014  . Thrombotic stroke involving right middle cerebral  artery ([place]) 06/08/2014  . Acute respiratory failure with hypoxia ([place]) 06/05/2014  . Cerebral infarction due to occlusion of right middle cerebral artery ([place]) 06/03/2014  . Cerebral infarct ([place]) 06/03/2014  . Altered mental status 06/03/2014  . GERD (gastroesophageal reflux disease) 04/12/2014  . Dyslipidemia- statin intol 02/07/2014  . Chest pain- Low risk Myoview July 2015 02/06/2014    Alda Lea, PT 07/08/2016, 9:15 PM  [facility] [address] [place], [place], [address] Phone: [phone]   Fax:  [phone]  Name: Mary Le MRN: [id number] Date of Birth: 1946/03/10

## 2016-07-11 ENCOUNTER — Ambulatory Visit: Payer: Medicare Other | Admitting: Physical Therapy

## 2016-07-12 LAB — CUP PACEART REMOTE DEVICE CHECK
Date Time Interrogation Session: 20171107011250
Implantable Pulse Generator Implant Date: 20151117

## 2016-07-12 NOTE — Progress Notes (Signed)
Carelink summary report received. Battery status OK. Normal device function. No new symptom episodes, tachy episodes, brady, or pause episodes. No new AF episodes. Monthly summary reports and ROV/PRN 

## 2016-07-17 ENCOUNTER — Ambulatory Visit: Payer: Medicare Other | Admitting: Physical Therapy

## 2016-07-19 ENCOUNTER — Ambulatory Visit: Payer: Medicare Other | Admitting: Physical Therapy

## 2016-07-19 ENCOUNTER — Encounter: Payer: Self-pay | Admitting: Cardiology

## 2016-07-23 ENCOUNTER — Ambulatory Visit: Payer: Medicare Other | Admitting: Physical Therapy

## 2016-07-26 ENCOUNTER — Ambulatory Visit (INDEPENDENT_AMBULATORY_CARE_PROVIDER_SITE_OTHER): Payer: Medicare Other | Admitting: *Deleted

## 2016-07-26 DIAGNOSIS — I63411 Cerebral infarction due to embolism of right middle cerebral artery: Secondary | ICD-10-CM | POA: Diagnosis not present

## 2016-07-29 NOTE — Progress Notes (Signed)
[facility] Summary Report / Loop Recorder 

## 2016-08-15 ENCOUNTER — Ambulatory Visit: Payer: Medicare Other | Admitting: Physical Medicine & Rehabilitation

## 2016-08-15 LAB — CUP PACEART REMOTE DEVICE CHECK
Implantable Pulse Generator Implant Date: 20151117
MDC IDC SESS DTM: 20171207013750

## 2016-08-15 NOTE — Progress Notes (Signed)
Carelink summary report received. Battery status OK. Normal device function. No new symptom episodes, tachy episodes, brady, or pause episodes. No new AF episodes. Monthly summary reports and ROV/PRN 

## 2016-08-20 ENCOUNTER — Ambulatory Visit (HOSPITAL_BASED_OUTPATIENT_CLINIC_OR_DEPARTMENT_OTHER): Payer: Medicare Other | Admitting: Physical Medicine & Rehabilitation

## 2016-08-20 ENCOUNTER — Encounter: Payer: Self-pay | Admitting: Physical Medicine & Rehabilitation

## 2016-08-20 ENCOUNTER — Encounter: Payer: Medicare Other | Attending: Physical Medicine & Rehabilitation

## 2016-08-20 VITALS — BP 137/84 | HR 85

## 2016-08-20 DIAGNOSIS — Z87891 Personal history of nicotine dependence: Secondary | ICD-10-CM | POA: Insufficient documentation

## 2016-08-20 DIAGNOSIS — G8110 Spastic hemiplegia affecting unspecified side: Secondary | ICD-10-CM | POA: Diagnosis not present

## 2016-08-20 DIAGNOSIS — I69354 Hemiplegia and hemiparesis following cerebral infarction affecting left non-dominant side: Secondary | ICD-10-CM | POA: Diagnosis not present

## 2016-08-20 DIAGNOSIS — I63411 Cerebral infarction due to embolism of right middle cerebral artery: Secondary | ICD-10-CM | POA: Diagnosis not present

## 2016-08-20 DIAGNOSIS — E785 Hyperlipidemia, unspecified: Secondary | ICD-10-CM | POA: Insufficient documentation

## 2016-08-20 NOTE — Progress Notes (Signed)
Subjective:    Patient ID: Mary Le, female    DOB: 02-Sep-1945, 71 y.o.   MRN: [id number]  HPI Felt achy and tired after the  Dysport injection on 07/04/16 Fingers are straighter, elbow is straighter and improved Left shoulder ROM Dysport injection No sig difference noted in Left foot Biceps 200 Brachial radialis, 200 FDS 200. FDP 200 FPL, 100 Pronator, 100  Soleus 300 Tib post 200  No falls since last visit. Patient is walking better with her left AFO, as well as completing physical therapy. Her walking speed has improved Pain Inventory Average Pain 2 Pain Right Now 0 My pain is intermittent  In the last 24 hours, has pain interfered with the following? General activity 0 Relation with others 0 Enjoyment of life 0 What TIME of day is your pain at its worst? . Sleep (in general) Good  Pain is worse with: . Pain improves with: . Relief from Meds: .  Mobility use a cane use a walker ability to climb steps?  yes do you drive?  no use a wheelchair transfers alone  Function I need assistance with the following:  dressing, bathing, meal prep, household duties and shopping  Neuro/Psych depression  Prior Studies Any changes since last visit?  no  Physicians involved in your care Any changes since last visit?  no   Family History  Problem Relation Age of Onset  . Coronary artery disease Mother 14    MI  . Kidney disease Father    Social History   Social History  . Marital status: Married    Spouse name: N/A  . Number of children: N/A  . Years of education: N/A   Social History Main Topics  . Smoking status: Former Smoker    Years: 5.00  . Smokeless tobacco: Never Used  . Alcohol use 0.6 oz/week    1 Glasses of wine per week     Comment: occasionally wine  . Drug use: No  . Sexual activity: Not on file   Other Topics Concern  . Not on file   Social History Narrative   Lives in [place]    Married. Education: [employer].    Past  Surgical History:  Procedure Laterality Date  . APPENDECTOMY    . HEMORROIDECTOMY    . LOOP RECORDER IMPLANT  06/07/2014   MDT LINQ implanted by Dr [name] for cryptogenic stroke  . LOOP RECORDER IMPLANT N/A 06/07/2014   Procedure: LOOP RECORDER IMPLANT;  Surgeon: [name], MD;  Location: [facility] CATH LAB;  Service: Cardiovascular;  Laterality: N/A;  . RADIOLOGY WITH ANESTHESIA N/A 06/03/2014   Procedure: RADIOLOGY WITH ANESTHESIA;  Surgeon: [name], MD;  Location: [facility];  Service: Radiology;  Laterality: N/A;  . TEE WITHOUT CARDIOVERSION N/A 06/07/2014   Procedure: TRANSESOPHAGEAL ECHOCARDIOGRAM (TEE);  Surgeon: [name], MD;  Location: [facility] ENDOSCOPY;  Service: Cardiovascular;  Laterality: N/A;  . TONSILLECTOMY    . TUBAL LIGATION     Past Medical History:  Diagnosis Date  . Back pain    arthritis  . CVA (cerebral infarction)   . Fall 11/2014   fell in bathroom, uses a cane  . Hypercholesteremia   . Hypertension   . Joint pain    back, history of   . Stroke ([place])   . Weakness    There were no vitals taken for this visit.  Opioid Risk Score:   Fall Risk Score:  `1  Depression screen PHQ 2/9  Depression screen  [facility] 2/9 11/22/2015 11/22/2015 07/24/2015 03/17/2015 01/15/2015 10/28/2014 01/03/2014  Decreased Interest 0 0 0 0 0 0 0  Down, Depressed, Hopeless 0 0 0 0 0 0 0  PHQ - 2 Score 0 0 0 0 0 0 0  Altered sleeping - - - - - 0 -  Change in appetite - - - - - 0 -  Feeling bad or failure about yourself  - - - - - 1 -  Trouble concentrating - - - - - 0 -  Moving slowly or fidgety/restless - - - - - 0 -  Suicidal thoughts - - - - - 0 -  PHQ-9 Score - - - - - 1 -   Review of Systems  Constitutional: Negative.   HENT: Negative.   Eyes: Negative.   Respiratory: Negative.   Cardiovascular: Negative.   Gastrointestinal: Negative.   Endocrine: Negative.   Genitourinary: Negative.   Musculoskeletal: Positive for gait problem.  Skin: Negative.     Allergic/Immunologic: Negative.   Hematological: Negative.   Psychiatric/Behavioral: Positive for dysphoric mood.  All other systems reviewed and are negative.      Objective:   Physical Exam  Ashworth grade 0 at the finger flexors, 0 at the wrist flexors 2, at the elbow flexors, 2-3 at the pectoralis. Ashworth 2 at the ankle plantar flexors, 1 at the foot inverters. With standing. There is no evidence of toe curling. No evidence of foot inversion. Foot is plantigrade, Motor strength is 2 minus at the deltoid, bicep, tricep, finger flexors, 0 at the finger extensors on the left. 3 minus, hip, knee extensor synergy on the left side. Sit to stand is with minimum assist, standing balance is fair      Assessment & Plan:  1. Left spastic hemiparesis, improvements with tone after Dysport injections. Patient's complaints of fatigue that lasted 2-3 days after the injection are likely coincidental. I do think this can be repeated without concern for this. We'll repeat on or after 10/03/2016  Continue use of left AFO. This has been beneficial for ambulating,

## 2016-08-23 ENCOUNTER — Other Ambulatory Visit (HOSPITAL_COMMUNITY): Payer: Self-pay | Admitting: Interventional Radiology

## 2016-08-23 DIAGNOSIS — I639 Cerebral infarction, unspecified: Secondary | ICD-10-CM

## 2016-08-26 ENCOUNTER — Ambulatory Visit (INDEPENDENT_AMBULATORY_CARE_PROVIDER_SITE_OTHER): Payer: Medicare Other | Admitting: *Deleted

## 2016-08-26 DIAGNOSIS — I63411 Cerebral infarction due to embolism of right middle cerebral artery: Secondary | ICD-10-CM

## 2016-08-26 NOTE — Progress Notes (Signed)
[facility] Summary Report / Loop Recorder 

## 2016-09-03 ENCOUNTER — Ambulatory Visit (HOSPITAL_COMMUNITY): Payer: Medicare Other

## 2016-09-03 ENCOUNTER — Ambulatory Visit (HOSPITAL_COMMUNITY)
Admission: RE | Admit: 2016-09-03 | Discharge: 2016-09-03 | Disposition: A | Discharge status: Home / Self Care | Payer: Medicare Other | Source: Ambulatory Visit | Attending: Interventional Radiology | Admitting: Interventional Radiology

## 2016-09-03 DIAGNOSIS — G9389 Other specified disorders of brain: Secondary | ICD-10-CM | POA: Insufficient documentation

## 2016-09-03 DIAGNOSIS — I639 Cerebral infarction, unspecified: Secondary | ICD-10-CM

## 2016-09-03 DIAGNOSIS — I6529 Occlusion and stenosis of unspecified carotid artery: Secondary | ICD-10-CM | POA: Diagnosis not present

## 2016-09-03 DIAGNOSIS — I700 Atherosclerosis of aorta: Secondary | ICD-10-CM | POA: Diagnosis not present

## 2016-09-03 LAB — CREATININE, SERUM: CREATININE: 0.79 mg/dL (ref 0.44–1.00)

## 2016-09-03 MED ORDER — IOPAMIDOL (ISOVUE-370) INJECTION 76%
50.0000 mL | Freq: Once | INTRAVENOUS | Status: AC | PRN
  Administered 2016-09-03: 50 mL via INTRAVENOUS

## 2016-09-09 LAB — CUP PACEART REMOTE DEVICE CHECK
MDC IDC PG IMPLANT DT: 20151117
MDC IDC SESS DTM: 20180106023800

## 2016-09-09 NOTE — Progress Notes (Signed)
Carelink summary report received. Battery status OK. Normal device function. No new symptom episodes, tachy episodes, brady, or pause episodes. No new AF episodes. Monthly summary reports and ROV/PRN 

## 2016-09-10 ENCOUNTER — Telehealth (HOSPITAL_COMMUNITY): Payer: Self-pay

## 2016-09-10 IMAGING — CR DG CHEST 2V
2 series · 2 of 2 positions shown · non-contrast
Comparison: Portable chest x-ray December 06, 2014

CLINICAL DATA: Fatigue, weight loss, and dyspnea on exertion,
history of morbid obesity, Sherine Pon is secondary to CVA,
previous history of tobacco use.

EXAM:
CHEST  2 VIEW

[PA]
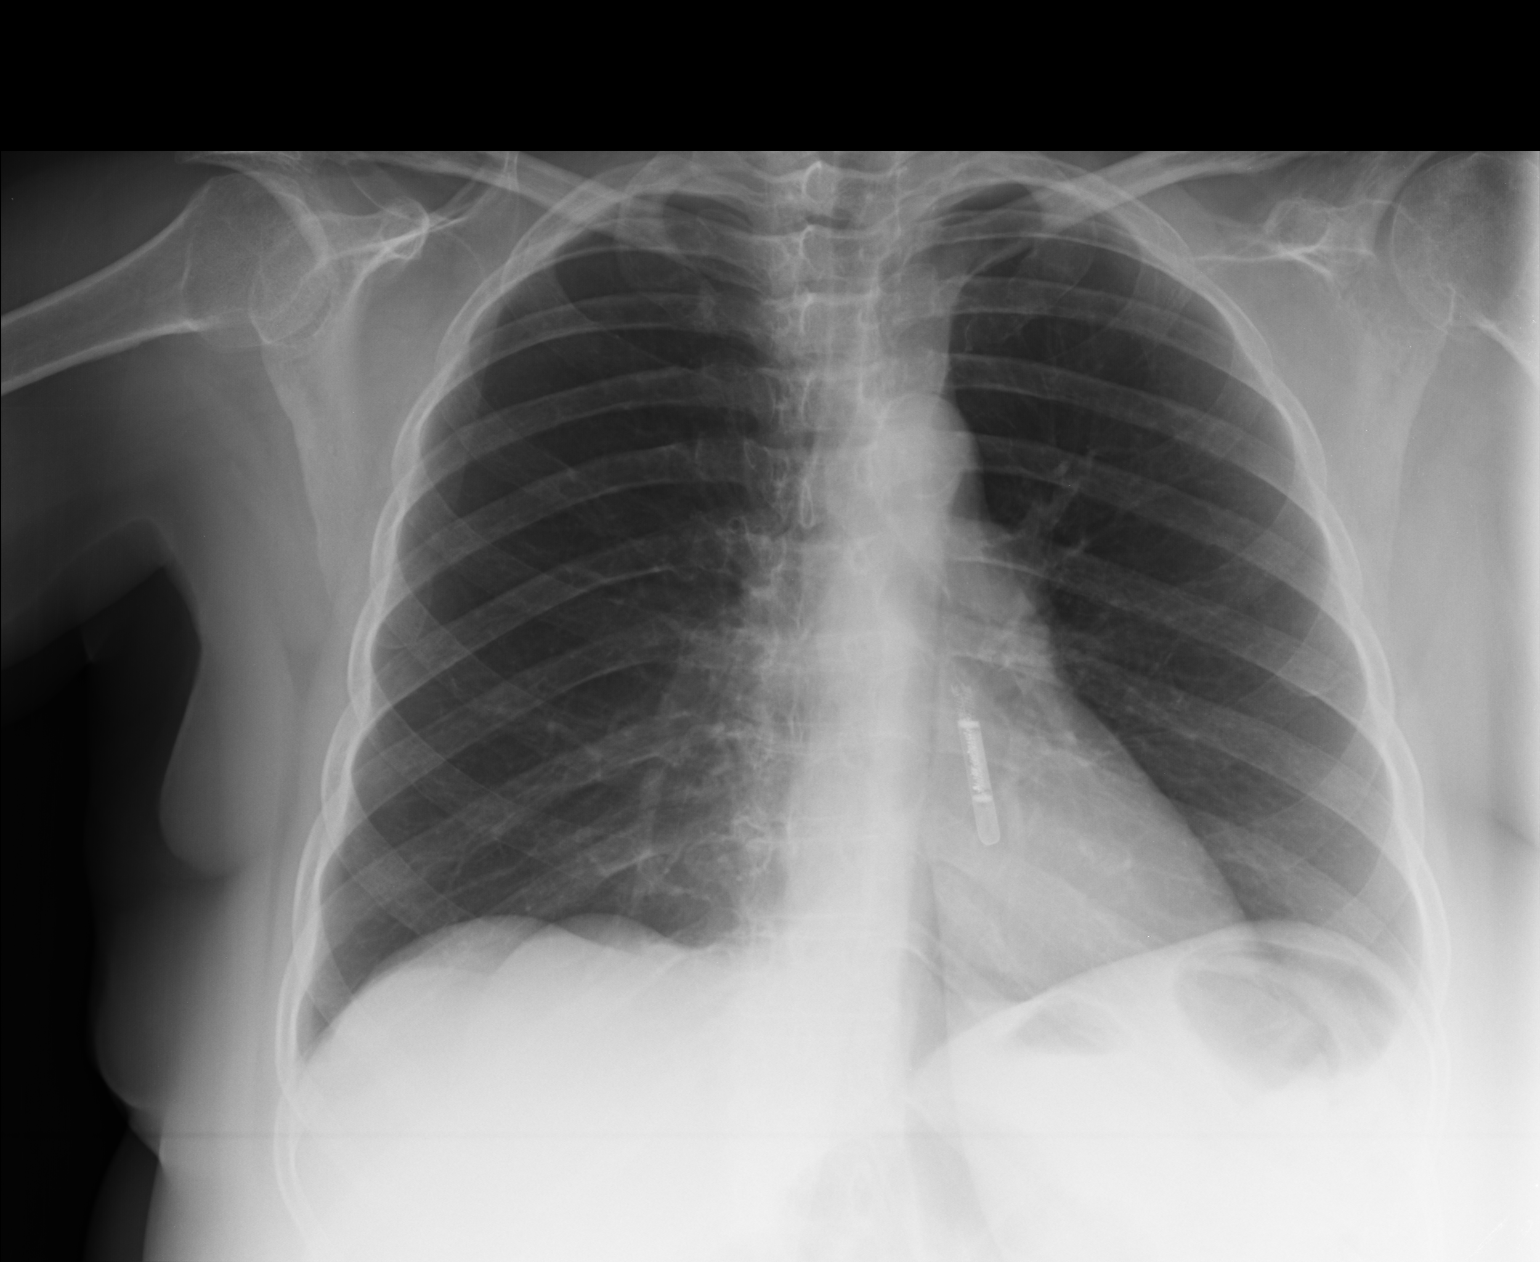

[lateral]
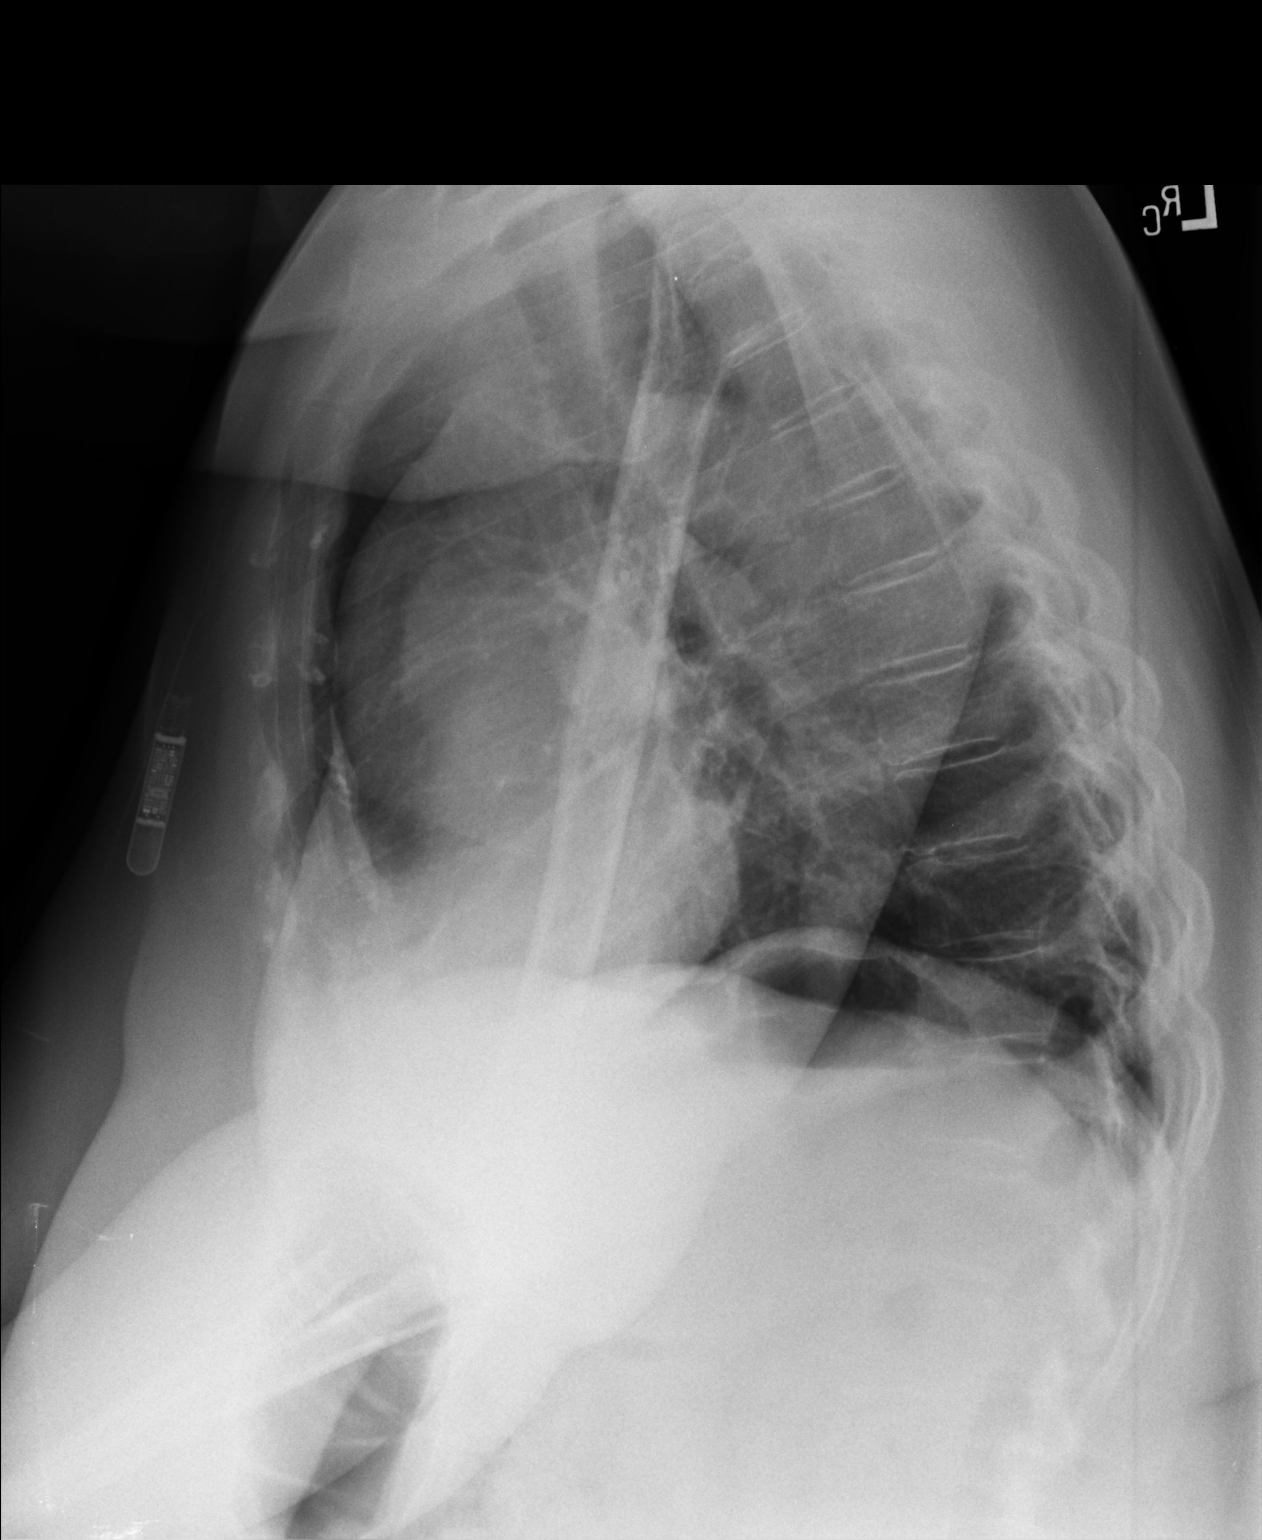

[2 of 2 positions shown; findings below may reference images not displayed]

FINDINGS: The lungs are adequately inflated and clear. The heart and
mediastinal structures are normal. The pulmonary vascularity is not
engorged. There is no pleural effusion or pneumothorax. The bony
thorax is unremarkable. An implantable cardiac loop recorder is
visible.
IMPRESSION: There is no active cardiopulmonary disease.

## 2016-09-10 NOTE — Telephone Encounter (Signed)
Pt's daughter agreed to have pt f/u in 6 month's with cta head and neck. AW

## 2016-09-11 NOTE — Progress Notes (Signed)
Pt needs IR IV start next time she comes in for CT scan prior to scan, she has no lines and they have to call iv team which is a very long process for pt.

## 2016-09-21 LAB — CUP PACEART REMOTE DEVICE CHECK
Date Time Interrogation Session: 20180205023838
Implantable Pulse Generator Implant Date: 20151117

## 2016-09-21 NOTE — Progress Notes (Signed)
Carelink summary report received. Battery status OK. Normal device function. No new symptom episodes, tachy episodes, brady, or pause episodes. No new AF episodes. Monthly summary reports and ROV/PRN 

## 2016-09-24 ENCOUNTER — Ambulatory Visit (INDEPENDENT_AMBULATORY_CARE_PROVIDER_SITE_OTHER): Payer: Medicare Other | Admitting: *Deleted

## 2016-09-24 DIAGNOSIS — I63411 Cerebral infarction due to embolism of right middle cerebral artery: Secondary | ICD-10-CM | POA: Diagnosis not present

## 2016-09-25 NOTE — Progress Notes (Signed)
[facility] Summary Report / Loop Recorder 

## 2016-10-08 ENCOUNTER — Ambulatory Visit (HOSPITAL_BASED_OUTPATIENT_CLINIC_OR_DEPARTMENT_OTHER): Payer: Medicare Other | Admitting: Physical Medicine & Rehabilitation

## 2016-10-08 ENCOUNTER — Encounter: Payer: Medicare Other | Attending: Physical Medicine & Rehabilitation

## 2016-10-08 ENCOUNTER — Encounter: Payer: Self-pay | Admitting: Physical Medicine & Rehabilitation

## 2016-10-08 VITALS — BP 121/86 | HR 90 | Resp 14

## 2016-10-08 DIAGNOSIS — E785 Hyperlipidemia, unspecified: Secondary | ICD-10-CM | POA: Insufficient documentation

## 2016-10-08 DIAGNOSIS — G8110 Spastic hemiplegia affecting unspecified side: Secondary | ICD-10-CM

## 2016-10-08 DIAGNOSIS — Z87891 Personal history of nicotine dependence: Secondary | ICD-10-CM | POA: Diagnosis not present

## 2016-10-08 DIAGNOSIS — G8114 Spastic hemiplegia affecting left nondominant side: Secondary | ICD-10-CM | POA: Diagnosis not present

## 2016-10-08 DIAGNOSIS — I69354 Hemiplegia and hemiparesis following cerebral infarction affecting left non-dominant side: Secondary | ICD-10-CM | POA: Insufficient documentation

## 2016-10-08 NOTE — Patient Instructions (Signed)

## 2016-10-08 NOTE — Progress Notes (Signed)
Dysport Injection for spasticity using needle EMG guidance  Dilution: 200 Units/ml Indication: Severe spasticity which interferes with ADL,mobility and/or  hygiene and is unresponsive to medication management and other conservative care Informed consent was obtained after describing risks and benefits of the procedure with the patient. This includes bleeding, bruising, infection, excessive weakness, or medication side effects. A REMS form is on file and signed. Needle:25g 1mm  needle electrode, except 30g 1" for lumbricals Number of units per muscle Left non dom side Biceps 200 Brachial radialis, 100 FDS 200. FDP 200 Pronator, 200 Lumbricals 100 divided into 3 areas  All injections were done after obtaining appropriate EMG activity and after negative drawback for blood. The patient tolerated the procedure well. Post procedure instructions were given. A followup appointment was made.

## 2016-10-13 DIAGNOSIS — R42 Dizziness and giddiness: Secondary | ICD-10-CM | POA: Diagnosis not present

## 2016-10-24 ENCOUNTER — Ambulatory Visit (INDEPENDENT_AMBULATORY_CARE_PROVIDER_SITE_OTHER): Payer: Medicare Other | Admitting: *Deleted

## 2016-10-24 DIAGNOSIS — I63411 Cerebral infarction due to embolism of right middle cerebral artery: Secondary | ICD-10-CM | POA: Diagnosis not present

## 2016-10-25 LAB — CUP PACEART REMOTE DEVICE CHECK
Implantable Pulse Generator Implant Date: 20151117
MDC IDC SESS DTM: 20180307030713

## 2016-10-25 NOTE — Progress Notes (Signed)
[facility] Summary Report / Loop Recorder 

## 2016-10-29 ENCOUNTER — Telehealth: Payer: Self-pay | Admitting: Family Medicine

## 2016-10-29 DIAGNOSIS — I10 Essential (primary) hypertension: Secondary | ICD-10-CM

## 2016-10-29 MED ORDER — METOPROLOL SUCCINATE ER 25 MG PO TB24: 12.5000 mg | ORAL_TABLET | Freq: Every day | ORAL | 0 refills | Status: DC

## 2016-10-29 NOTE — Telephone Encounter (Addendum)
Pt is needing a partial rx for metoprolol at least two weeks so that she can get her account straight and schedule an appt with dr Vicki Mallet number for daughter [phone]  [facility] [place]

## 2016-11-02 LAB — CUP PACEART REMOTE DEVICE CHECK
Implantable Pulse Generator Implant Date: 20151117
MDC IDC SESS DTM: 20180406041352

## 2016-11-02 NOTE — Progress Notes (Signed)
Carelink summary report received. Battery status OK. Normal device function. No new symptom episodes, tachy episodes, brady, or pause episodes. No new AF episodes. Monthly summary reports and ROV/PRN 

## 2016-11-05 ENCOUNTER — Ambulatory Visit: Payer: Medicare Other

## 2016-11-09 ENCOUNTER — Ambulatory Visit: Payer: Medicare Other

## 2016-11-19 ENCOUNTER — Encounter: Payer: Medicare Other | Attending: Physical Medicine & Rehabilitation

## 2016-11-19 ENCOUNTER — Ambulatory Visit: Payer: Medicare Other | Admitting: Physical Medicine & Rehabilitation

## 2016-11-19 DIAGNOSIS — I69354 Hemiplegia and hemiparesis following cerebral infarction affecting left non-dominant side: Secondary | ICD-10-CM | POA: Insufficient documentation

## 2016-11-19 DIAGNOSIS — Z87891 Personal history of nicotine dependence: Secondary | ICD-10-CM | POA: Insufficient documentation

## 2016-11-19 DIAGNOSIS — E785 Hyperlipidemia, unspecified: Secondary | ICD-10-CM | POA: Insufficient documentation

## 2016-11-25 ENCOUNTER — Ambulatory Visit (INDEPENDENT_AMBULATORY_CARE_PROVIDER_SITE_OTHER): Payer: Medicare Other | Admitting: *Deleted

## 2016-11-25 DIAGNOSIS — I63411 Cerebral infarction due to embolism of right middle cerebral artery: Secondary | ICD-10-CM | POA: Diagnosis not present

## 2016-11-26 NOTE — Progress Notes (Signed)
[facility] Summary Report / Loop Recorder 

## 2016-12-05 ENCOUNTER — Ambulatory Visit (INDEPENDENT_AMBULATORY_CARE_PROVIDER_SITE_OTHER): Payer: Medicare Other | Admitting: Family Medicine

## 2016-12-05 ENCOUNTER — Encounter: Payer: Self-pay | Admitting: Family Medicine

## 2016-12-05 VITALS — BP 133/82 | HR 85 | Temp 98.2°F | Resp 16 | Ht 64.0 in | Wt 199.6 lb

## 2016-12-05 DIAGNOSIS — Z789 Other specified health status: Secondary | ICD-10-CM

## 2016-12-05 DIAGNOSIS — I63311 Cerebral infarction due to thrombosis of right middle cerebral artery: Secondary | ICD-10-CM

## 2016-12-05 DIAGNOSIS — I10 Essential (primary) hypertension: Secondary | ICD-10-CM | POA: Diagnosis not present

## 2016-12-05 DIAGNOSIS — E785 Hyperlipidemia, unspecified: Secondary | ICD-10-CM | POA: Diagnosis not present

## 2016-12-05 DIAGNOSIS — I63411 Cerebral infarction due to embolism of right middle cerebral artery: Secondary | ICD-10-CM | POA: Diagnosis not present

## 2016-12-05 DIAGNOSIS — R7303 Prediabetes: Secondary | ICD-10-CM

## 2016-12-05 DIAGNOSIS — G8110 Spastic hemiplegia affecting unspecified side: Secondary | ICD-10-CM | POA: Diagnosis not present

## 2016-12-05 MED ORDER — METOPROLOL SUCCINATE ER 25 MG PO TB24: 12.5000 mg | ORAL_TABLET | Freq: Every day | ORAL | 0 refills | Status: DC

## 2016-12-05 MED ORDER — CLOPIDOGREL BISULFATE 75 MG PO TABS: 75.0000 mg | ORAL_TABLET | Freq: Every day | ORAL | 2 refills | Status: DC

## 2016-12-05 NOTE — Patient Instructions (Addendum)
We need to catch up on some health maintenance items as discussed, and would like to discuss back further at another visit. Please schedule return appointment in next month or two if possible.  Based on home blood pressure readings, will keep you on same dose of Toprol.   I refilled your clopidrogel once per day - keep follow up with neurologist.   For cholesterol, Repatha may be an option. See information on that medicine and cost and let me know if you are ready to start it.   Evolocumab injection What is this medicine? EVOLOCUMAB (e voe LOK ue mab) is known as a PCSK9 inhibitor. It is used to lower the level of cholesterol in the blood. It may be used alone or in combination with other cholesterol-lowering drugs. This drug may also be used to reduce the risk of heart attack, stroke, and certain types of heart surgery in patients with heart disease. This medicine may be used for other purposes; ask your health care provider or pharmacist if you have questions. COMMON BRAND NAME(S): REPATHA What should I tell my health care provider before I take this medicine? They need to know if you have any of these conditions: -an unusual or allergic reaction to evolocumab, other medicines, foods, dyes, or preservatives -pregnant or trying to get pregnant -breast-feeding How should I use this medicine? This medicine is for injection under the skin. You will be taught how to prepare and give this medicine. Use exactly as directed. Take your medicine at regular intervals. Do not take your medicine more often than directed. It is important that you put your used needles and syringes in a special sharps container. Do not put them in a trash can. If you do not have a sharps container, call your pharmacist or health care provider to get one. Talk to your pediatrician regarding the use of this medicine in children. While this drug may be prescribed for children as young as 13 years for selected conditions,  precautions do apply. Overdosage: If you think you have taken too much of this medicine contact a poison control center or emergency room at once. NOTE: This medicine is only for you. Do not share this medicine with others. What if I miss a dose? If you miss a dose, take it as soon as you can if there are more than 7 days until the next scheduled dose, or skip the missed dose and take the next dose according to your original schedule. Do not take double or extra doses. What may interact with this medicine? Interactions are not expected. This list may not describe all possible interactions. Give your health care provider a list of all the medicines, herbs, non-prescription drugs, or dietary supplements you use. Also tell them if you smoke, drink alcohol, or use illegal drugs. Some items may interact with your medicine. What should I watch for while using this medicine? You may need blood work while you are taking this medicine. What side effects may I notice from receiving this medicine? Side effects that you should report to your doctor or health care professional as soon as possible: -allergic reactions like skin rash, itching or hives, swelling of the face, lips, or tongue -signs and symptoms of infection like fever or chills; cough; sore throat; pain or trouble passing urine Side effects that usually do not require medical attention (report to your doctor or health care professional if they continue or are bothersome): -diarrhea -nausea -muscle pain -pain, redness, or irritation at site  where injected This list may not describe all possible side effects. Call your doctor for medical advice about side effects. You may report side effects to FDA at 1-800-FDA-1088. Where should I keep my medicine? Keep out of the reach of children. You will be instructed on how to store this medicine. Throw away any unused medicine after the expiration date on the label. NOTE: This sheet is a summary. It may  not cover all possible information. If you have questions about this medicine, talk to your doctor, pharmacist, or health care provider.  2018 Elsevier/Gold Standard (2016-06-24 13:21:53)    We recommend that you schedule a mammogram for breast cancer screening. Typically, you do not need a referral to do this. Please contact a local imaging center to schedule your mammogram.  [facility] - [phone]  *ask for the Radiology Department [facility] ([place]) - [phone] or [phone]  [facility] - [phone] [place] [phone] [facility] - [phone]  *ask for the [facility] - [phone]  *ask for the Radiology Department [facility] - [phone]  *ask for the [place] - [phone]    IF you received an x-ray today, you will receive an invoice from [facility] Radiology. Please contact [facility] Radiology at [phone] with questions or concerns regarding your invoice.   IF you received labwork today, you will receive an invoice from [place]. Please contact [facility] at [phone] with questions or concerns regarding your invoice.   Our billing staff will not be able to assist you with questions regarding bills from these companies.  You will be contacted with the lab results as soon as they are available. The fastest way to get your results is to activate your My Chart account. Instructions are located on the last page of this paperwork. If you have not heard from [place] regarding the results in 2 weeks, please contact this office.

## 2016-12-05 NOTE — Progress Notes (Signed)
By signing my name below, I, [name], attest that this documentation has been prepared under the direction and in the presence of [name], MD.  Electronically Signed: [name], Medical Scribe. 12/05/16. 12:13 PM.  Subjective:    Patient ID: Mary Le, female    DOB: 1946/05/14, 71 y.o.   MRN: [id number]  HPI Chief Complaint  Patient presents with  . Medication Refill    Clopidogrel Bisulfate 75 MG, Metoprolol Succinate 25 MG  . Forms to be filled out for [facility]- Appt tomorrow.    HPI Comments: Mary Le is a 71 y.o. female with a PMHx of CVA of MCA who presents to Primary Care at [facility] for follow-up with medication refill on plavix and toprol and [facility] paperwork. She was last seen by me May 2017. Pt is fasting today.  [facility] Paperwork: For financial home aid. Pt's daughter has been taking care of her parents for the last 2 years and would like financial help.  CVA of MCA: She is followed by Dr. Leonie Man with [facility] Neuro and Dr. Letta Pate for spastic hemiplegia. CVA was in 05/2014 she has been continued on ASA 81 mg QD, and plavix 25 mg QD for secondary prevention. Goal bp control < 130/90, goal A1c < 6.5, and goal LDL <70. She has been intolerant to statins so has been recommended to start PCSK9 inhibitor at office visit with neurology in 2017. For her spastic hemiplegia, she has been receiving dysport injections - most recently 10/08/16.  Pt is compliant with plavix, and ASA. Pt has not taken the PCSK9 for her cholesterol yet - would like to look further into coverage.  Pt has limited left sided function and has some difficulty wiping after a bowel movement   Pt has difficulty remembering days at times - Requires family assistance for medication dispensing.. Denies bowel/bladder incontinence.  Lower Back Pain: Pt has lower back pain that limits her movement. Pt can walk short distances on her own as well as with her cane. This is been a long-standing issue, including when she  was in the hospital. Pt has not apparently discussed this with Dr. Letta Pate  Hyperglycemia: Thought to be at pre-DM range previously. Has not had repeat testing recently. Last office visit with me was in May 2017. Lab Results  Component Value Date   HGBA1C 5.9 (H) 06/28/2015   Wt Readings from Last 3 Encounters:  12/05/16 199 lb 9.6 oz (90.5 kg)  02/28/16 188 lb (85.3 kg)  02/21/16 189 lb 6.4 oz (85.9 kg)   HLD: Planned for fasting lipid visit after May appt with me last year, but did not have that blood work drawn, and last visit with me was in May 2017.  Lab Results  Component Value Date   CHOL 275 (H) 06/28/2015   HDL 51 06/28/2015   LDLCALC 204 (H) 06/28/2015   TRIG 101 06/28/2015   CHOLHDL 5.4 (H) 06/28/2015   Lab Results  Component Value Date   ALT 22 01/15/2015   AST 12 01/15/2015   ALKPHOS 57 01/15/2015   BILITOT 0.5 01/15/2015   HTN:    goal blood pressure as above.Takes toprol 12.5 mg QD. Plan for follow-up with Dr. Leonie Man Aug 2018. She was evalulated by vascular surgery in Aug 2017 for chronic venous insufficiency and PVD, recommended to stay as active as possible, avoid prolong sitting or standing. Did not feel like compression stockings would be tolerated. Recommended repeat vascular study in 1 year as no rest pains or ulcers.  Pt is compliant with toprol and her bp measures 120/70. Denies chest pain, and trouble breathing. Lab Results  Component Value Date   CREATININE 0.79 09/03/2016   BP Readings from Last 3 Encounters:  12/05/16 133/82  10/08/16 121/86  08/20/16 137/84   Patient Active Problem List   Diagnosis Date Noted  . HTN (hypertension) 07/26/2015  . Obesity (BMI 30.0-34.9) 07/26/2015  . Syncope 12/06/2014  . Spastic hemiplegia affecting nondominant side ([place]) 07/25/2014  . 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 Left hemiparesis ([place]) 06/10/2014  . Left-sided neglect 06/10/2014  . Thrombotic stroke involving  right middle cerebral artery ([place]) 06/08/2014  . Acute respiratory failure with hypoxia ([place]) 06/05/2014  . Cerebral infarction due to occlusion of right middle cerebral artery ([place]) 06/03/2014  . Cerebral infarct ([place]) 06/03/2014  . Altered mental status 06/03/2014  . GERD (gastroesophageal reflux disease) 04/12/2014  . Dyslipidemia- statin intol 02/07/2014  . Chest pain- Low risk Myoview July 2015 02/06/2014   Past Medical History:  Diagnosis Date  . Back pain    arthritis  . CVA (cerebral infarction)   . Fall 11/2014   fell in bathroom, uses a cane  . Hypercholesteremia   . Hypertension   . Joint pain    back, history of   . Stroke ([place])   . Weakness    Past Surgical History:  Procedure Laterality Date  . APPENDECTOMY    . HEMORROIDECTOMY    . LOOP RECORDER IMPLANT  06/07/2014   MDT LINQ implanted by Dr [name] for cryptogenic stroke  . LOOP RECORDER IMPLANT N/A 06/07/2014   Procedure: LOOP RECORDER IMPLANT;  Surgeon: [name], MD;  Location: [facility] CATH LAB;  Service: Cardiovascular;  Laterality: N/A;  . RADIOLOGY WITH ANESTHESIA N/A 06/03/2014   Procedure: RADIOLOGY WITH ANESTHESIA;  Surgeon: [name], MD;  Location: [facility];  Service: Radiology;  Laterality: N/A;  . TEE WITHOUT CARDIOVERSION N/A 06/07/2014   Procedure: TRANSESOPHAGEAL ECHOCARDIOGRAM (TEE);  Surgeon: [name], MD;  Location[facility] ENDOSCOPY;  Service: Cardiovascular;  Laterality: N/A;  . TONSILLECTOMY    . TUBAL LIGATION     Allergies  Allergen Reactions  . Tizanidine Itching  . Aspirin Nausea And Vomiting    Tolerated baby aspirin, but with more than once per day of full strength for aches and pains - had stomach upset.  No history of PUD/gastric bleeding known.   . Statins Other (See Comments)    Myalgias and chest pain (has tried Lipitor and Pravachol)   Prior to Admission medications   Medication Sig Start Date End Date Taking? Authorizing Provider  acetaminophen (TYLENOL) 325 MG  tablet Take 650 mg by mouth every 6 (six) hours as needed (pain).    Yes [provider]  aspirin 81 MG tablet Take 81 mg by mouth daily.   Yes [provider]  clopidogrel (PLAVIX) 75 MG tablet TAKE 1 TABLET EVERY DAY 01/30/16  Yes Garvin Fila, MD  clotrimazole (LOTRIMIN) 1 % cream Apply 1 application topically 2 (two) times daily. 11/22/15  Yes [name], MD  famotidine (PEPCID) 10 MG tablet Take 1 tablet (10 mg total) by mouth 2 (two) times daily. Patient taking differently: Take 10 mg by mouth daily as needed.  06/23/14  Yes Love, [name], PA-C  loratadine (ALAVERT) 10 MG tablet Take 10 mg by mouth daily as needed for allergies.   Yes [provider]  metoprolol succinate (TOPROL-XL) 25 MG 24  hr tablet Take 0.5 tablets (12.5 mg total) by mouth daily. 10/29/16  Yes [name], MD  Multiple Vitamin (MULTIVITAMIN WITH MINERALS) TABS tablet Take 1 tablet by mouth daily. Reported on 11/22/2015   Yes [provider]  Polyethyl Glycol-Propyl Glycol (SYSTANE OP) Place 1 drop into both eyes daily as needed (dry eyeys).   Yes [provider]  senna-docusate (SENOKOT-S) 8.6-50 MG per tablet Take 3 tablets by mouth 2 (two) times daily. Patient taking differently: Take 2 tablets by mouth 2 (two) times daily.  06/23/14  Yes Love, [name], PA-C   Social History   Social History  . Marital status: Married    Spouse name: N/A  . Number of children: N/A  . Years of education: N/A   Occupational History  . Not on file.   Social History Main Topics  . Smoking status: Former Smoker    Years: 5.00  . Smokeless tobacco: Never Used  . Alcohol use 0.6 oz/week    1 Glasses of wine per week     Comment: occasionally wine  . Drug use: No  . Sexual activity: Not on file   Other Topics Concern  . Not on file   Social History Narrative   Lives in [place]    Married. Education: [employer].    Review of Systems  Respiratory: Negative for  shortness of breath and wheezing.   Cardiovascular: Negative for chest pain.  Musculoskeletal: Positive for back pain and gait problem (chronic).  Neurological: Positive for weakness (chronic) and numbness (chronic).   Objective:  Physical Exam  Constitutional: She appears well-developed and well-nourished. No distress.  HENT:  Head: Normocephalic and atraumatic.  Eyes: Conjunctivae are normal.  Neck: Neck supple.  Equal ROM in neck C-spine motion overall in tact  Cardiovascular: Normal rate, regular rhythm and normal heart sounds.  Exam reveals no gallop and no friction rub.   No murmur heard. Pulmonary/Chest: Effort normal and breath sounds normal. No respiratory distress. She has no wheezes. She has no rales.  Abdominal: There is no tenderness.  Musculoskeletal: She exhibits no edema (lower extreimty).  Left arm is held across lap, slight shoulder movement, no visible forearm movement and unable to grip She is able to slightly extend left leg, she does have a brace in place at foot Weak with dorsi flexion of foot Able to slowly bend knee to 90 degrees Atrophy in left arm  Neurological: She is alert.  Skin: Skin is warm and dry.  Psychiatric: She has a normal mood and affect. Her behavior is normal.  Nursing note and vitals reviewed.   Vitals:   12/05/16 1127  BP: 133/82  Pulse: 85  Resp: 16  Temp: 98.2 F (36.8 C)  TempSrc: Oral  SpO2: 96%  Weight: 199 lb 9.6 oz (90.5 kg)  Height: 5\' 4"  (1.626 m)  Body mass index is 34.26 kg/m. Assessment & Plan:   Mary Le is a 71 y.o. female Thrombotic stroke involving right middle cerebral artery ([place]) - Plan: Care order/instruction: Spastic hemiplegia affecting nondominant side ([place])  - Continued to be followed by physical medicine and rehabilitation, and plan follow-up with neurology in next 3 months. Continue Plavix, reordered.  -Most of visit was reviewing and completing paperwork for [facility]. See scanned copies. If more  specific information regarding deficits needed, may be best filled out by physical medicine and rehabilitation, or therapist.  Prediabetes - Plan: Hemoglobin A1c, Comprehensive metabolic panel  -Check CMP, A1c.  Essential hypertension - Plan:  Comprehensive metabolic panel, metoprolol succinate (TOPROL-XL) 25 MG 24 hr tablet  -Borderline in office, home readings at goal. No change in Toprol dose for now.  Hyperlipidemia, unspecified hyperlipidemia type - Plan: Lipid panel, Comprehensive metabolic panel, Statin intolerance - Plan: Lipid panel  -Intolerant to multiple statins. Secondary prevention discussed with previous CVA.  Discussed Repatha. Would like to check into coverage first. Labs pending.  Discussed need for follow-up within the next 1-2 months to catch up on other health maintenance items and ongoing care as had not seen her in one year. Understanding expressed.  Meds ordered this encounter  Medications  . clopidogrel (PLAVIX) 75 MG tablet    Sig: Take 1 tablet (75 mg total) by mouth daily.    Dispense:  90 tablet    Refill:  2  . metoprolol succinate (TOPROL-XL) 25 MG 24 hr tablet    Sig: Take 0.5 tablets (12.5 mg total) by mouth daily.    Dispense:  90 tablet    Refill:  0   Patient Instructions   We need to catch up on some health maintenance items as discussed, and would like to discuss back further at another visit. Please schedule return appointment in next month or two if possible.  Based on home blood pressure readings, will keep you on same dose of Toprol.   I refilled your clopidrogel once per day - keep follow up with neurologist.   For cholesterol, Repatha may be an option. See information on that medicine and cost and let me know if you are ready to start it.   Evolocumab injection What is this medicine? EVOLOCUMAB (e voe LOK ue mab) is known as a PCSK9 inhibitor. It is used to lower the level of cholesterol in the blood. It may be used alone or in  combination with other cholesterol-lowering drugs. This drug may also be used to reduce the risk of heart attack, stroke, and certain types of heart surgery in patients with heart disease. This medicine may be used for other purposes; ask your health care provider or pharmacist if you have questions. COMMON BRAND NAME(S): REPATHA What should I tell my health care provider before I take this medicine? They need to know if you have any of these conditions: -an unusual or allergic reaction to evolocumab, other medicines, foods, dyes, or preservatives -pregnant or trying to get pregnant -breast-feeding How should I use this medicine? This medicine is for injection under the skin. You will be taught how to prepare and give this medicine. Use exactly as directed. Take your medicine at regular intervals. Do not take your medicine more often than directed. It is important that you put your used needles and syringes in a special sharps container. Do not put them in a trash can. If you do not have a sharps container, call your pharmacist or health care provider to get one. Talk to your pediatrician regarding the use of this medicine in children. While this drug may be prescribed for children as young as 13 years for selected conditions, precautions do apply. Overdosage: If you think you have taken too much of this medicine contact a poison control center or emergency room at once. NOTE: This medicine is only for you. Do not share this medicine with others. What if I miss a dose? If you miss a dose, take it as soon as you can if there are more than 7 days until the next scheduled dose, or skip the missed dose and take the next dose  according to your original schedule. Do not take double or extra doses. What may interact with this medicine? Interactions are not expected. This list may not describe all possible interactions. Give your health care provider a list of all the medicines, herbs, non-prescription  drugs, or dietary supplements you use. Also tell them if you smoke, drink alcohol, or use illegal drugs. Some items may interact with your medicine. What should I watch for while using this medicine? You may need blood work while you are taking this medicine. What side effects may I notice from receiving this medicine? Side effects that you should report to your doctor or health care professional as soon as possible: -allergic reactions like skin rash, itching or hives, swelling of the face, lips, or tongue -signs and symptoms of infection like fever or chills; cough; sore throat; pain or trouble passing urine Side effects that usually do not require medical attention (report to your doctor or health care professional if they continue or are bothersome): -diarrhea -nausea -muscle pain -pain, redness, or irritation at site where injected This list may not describe all possible side effects. Call your doctor for medical advice about side effects. You may report side effects to FDA at 1-800-FDA-1088. Where should I keep my medicine? Keep out of the reach of children. You will be instructed on how to store this medicine. Throw away any unused medicine after the expiration date on the label. NOTE: This sheet is a summary. It may not cover all possible information. If you have questions about this medicine, talk to your doctor, pharmacist, or health care provider.  2018 Elsevier/Gold Standard (2016-06-24 13:21:53)    We recommend that you schedule a mammogram for breast cancer screening. Typically, you do not need a referral to do this. Please contact a local imaging center to schedule your mammogram.  [facility] - [phone]  *ask for the Radiology Department [facility] ([place]) - [phone] or [phone]  [facility] - [phone] [place] [phone] [facility] - [phone]  *ask for the [facility] - [phone]  *ask for the Radiology Department [facility] - [phone]  *ask for the [place] - [phone]    IF you received an x-ray today, you will receive an invoice from [facility] Radiology. Please contact [facility] Radiology at [phone] with questions or concerns regarding your invoice.   IF you received labwork today, you will receive an invoice from [place]. Please contact [facility] at [phone] with questions or concerns regarding your invoice.   Our billing staff will not be able to assist you with questions regarding bills from these companies.  You will be contacted with the lab results as soon as they are available. The fastest way to get your results is to activate your My Chart account. Instructions are located on the last page of this paperwork. If you have not heard from [place] regarding the results in 2 weeks, please contact this office.      I personally performed the services described in this documentation, which was scribed in my presence. The recorded information has been reviewed and considered for accuracy and completeness, addended by me as needed, and agree with information above.  Signed,   [name], MD [facility].  12/06/16 10:41 PM

## 2016-12-06 LAB — LIPID PANEL
CHOL/HDL RATIO: 4.6 ratio — AB (ref 0.0–4.4)
Cholesterol, Total: 264 mg/dL — ABNORMAL HIGH (ref 100–199)
HDL: 58 mg/dL (ref >39)
LDL CALC: 185 mg/dL — AB (ref 0–99)
Triglycerides: 105 mg/dL (ref 0–149)
VLDL CHOLESTEROL CAL: 21 mg/dL (ref 5–40)

## 2016-12-06 LAB — COMPREHENSIVE METABOLIC PANEL
ALBUMIN: 4.1 g/dL (ref 3.6–4.8)
ALK PHOS: 71 IU/L (ref 39–117)
ALT: 15 IU/L (ref 0–32)
AST: 14 IU/L (ref 0–40)
Albumin/Globulin Ratio: 1.3 (ref 1.2–2.2)
BILIRUBIN TOTAL: 0.3 mg/dL (ref 0.0–1.2)
BUN/Creatinine Ratio: 20 (ref 12–28)
BUN: 15 mg/dL (ref 8–27)
CHLORIDE: 104 mmol/L (ref 96–106)
CO2: 19 mmol/L (ref 18–29)
Calcium: 9.4 mg/dL (ref 8.7–10.3)
Creatinine, Ser: 0.74 mg/dL (ref 0.57–1.00)
GFR calc non Af Amer: 83 mL/min/{1.73_m2} (ref >59)
GFR, EST AFRICAN AMERICAN: 96 mL/min/{1.73_m2} (ref >59)
GLUCOSE: 81 mg/dL (ref 65–99)
Globulin, Total: 3.1 g/dL (ref 1.5–4.5)
POTASSIUM: 4.2 mmol/L (ref 3.5–5.2)
Sodium: 143 mmol/L (ref 134–144)
Total Protein: 7.2 g/dL (ref 6.0–8.5)

## 2016-12-06 LAB — HEMOGLOBIN A1C
Est. average glucose Bld gHb Est-mCnc: 111 mg/dL
HEMOGLOBIN A1C: 5.5 % (ref 4.8–5.6)

## 2016-12-09 LAB — CUP PACEART REMOTE DEVICE CHECK
Date Time Interrogation Session: 20180506043726
MDC IDC PG IMPLANT DT: 20151117

## 2016-12-24 ENCOUNTER — Ambulatory Visit (INDEPENDENT_AMBULATORY_CARE_PROVIDER_SITE_OTHER): Payer: Medicare Other | Admitting: *Deleted

## 2016-12-24 DIAGNOSIS — I63411 Cerebral infarction due to embolism of right middle cerebral artery: Secondary | ICD-10-CM | POA: Diagnosis not present

## 2016-12-24 NOTE — Progress Notes (Signed)
[facility] Summary Report / Loop Recorder 

## 2016-12-30 LAB — CUP PACEART REMOTE DEVICE CHECK
Date Time Interrogation Session: 20180605054103
Implantable Pulse Generator Implant Date: 20151117

## 2016-12-30 NOTE — Progress Notes (Signed)
Carelink summary report received. Battery status OK. Normal device function. No new symptom episodes, tachy episodes, brady, or pause episodes. No new AF episodes. Monthly summary reports and ROV/PRN 

## 2017-01-09 ENCOUNTER — Ambulatory Visit (INDEPENDENT_AMBULATORY_CARE_PROVIDER_SITE_OTHER): Payer: Medicare Other | Admitting: Family Medicine

## 2017-01-09 ENCOUNTER — Encounter: Payer: Self-pay | Admitting: Family Medicine

## 2017-01-09 VITALS — BP 144/80 | HR 87 | Temp 97.0°F | Resp 18 | Ht 62.0 in | Wt 200.0 lb

## 2017-01-09 DIAGNOSIS — E2839 Other primary ovarian failure: Secondary | ICD-10-CM

## 2017-01-09 DIAGNOSIS — M545 Low back pain, unspecified: Secondary | ICD-10-CM

## 2017-01-09 DIAGNOSIS — Z23 Encounter for immunization: Secondary | ICD-10-CM | POA: Diagnosis not present

## 2017-01-09 DIAGNOSIS — I63411 Cerebral infarction due to embolism of right middle cerebral artery: Secondary | ICD-10-CM | POA: Diagnosis not present

## 2017-01-09 DIAGNOSIS — E785 Hyperlipidemia, unspecified: Secondary | ICD-10-CM | POA: Diagnosis not present

## 2017-01-09 DIAGNOSIS — G8929 Other chronic pain: Secondary | ICD-10-CM | POA: Diagnosis not present

## 2017-01-09 DIAGNOSIS — Z1211 Encounter for screening for malignant neoplasm of colon: Secondary | ICD-10-CM | POA: Diagnosis not present

## 2017-01-09 MED ORDER — ZOSTER VAC RECOMB ADJUVANTED 50 MCG/0.5ML IM SUSR: 0.5000 mL | Freq: Once | INTRAMUSCULAR | 1 refills | Status: AC

## 2017-01-09 NOTE — Patient Instructions (Addendum)
For your cholesterol, I will check into Repatha injection to order correct one and to discuss how to inject this medicine.   I will refer you to gastroenterologist locally for colonoscopy.   I will order bone density test.   I will refer you back to Dr. Hal Morales to discuss your back pain. I will write for low dose of muscle relaxant to be used up to 3 times per day for now.   Drink at least 64 ounces of water daily. Consider a humidifier for the room where you sleep. Bathe once daily. Avoid using HOT water, as it dries skin.  Avoid deodorant soaps (Dial is the worst!) and stick with gentle cleansers (I like Cetaphil Liquid Cleanser). After bathing, dry off completely, then apply a thick emollient cream (I like Cetaphil Moisturizing Cream). Apply the cream twice daily, or more!   We recommend that you schedule a mammogram for breast cancer screening. Typically, you do not need a referral to do this. Please contact a local imaging center to schedule your mammogram.  [facility] - [phone]  *ask for the Radiology Department [facility] - [phone] or [phone]  [facility] - [phone] [place] [phone] [facility] - [phone]  *ask for the [facility] - [phone]  *ask for the Radiology Department [facility] - [phone]  *ask for the [facility] - [phone]    IF you received an x-ray today, you will receive an invoice from [facility]. Please contact [facility] at [phone] with questions or concerns regarding your invoice.   IF you received labwork today, you will receive an invoice from [facility]. Please contact [facility] at [phone] with questions or concerns regarding your invoice.   Our billing staff will not be able to assist you with questions regarding bills from  these companies.  You will be contacted with the lab results as soon as they are available. The fastest way to get your results is to activate your My Chart account. Instructions are located on the last page of this paperwork. If you have not heard from [place] regarding the results in 2 weeks, please contact this office.

## 2017-01-09 NOTE — Progress Notes (Signed)
Subjective:  By signing my name below, I, Mary Le, attest that this documentation has been prepared under the direction and in the presence of [name], MD. Electronically Signed: Moises Le, [place]. 01/09/2017 , 3:14 PM .  Patient was seen in Room 25 .   Patient ID: Mary Le, female    DOB: July 17, 1946, 71 y.o.   MRN: [id number] Chief Complaint  Patient presents with  . Back Pain    follow up from last OV and go over MRI   HPI Mary Le is a 71 y.o. female  Here for follow up. Patient was last seen on May 17th, and was asked to return to catch up on a few health maintenance items. At last visit, we completed her paperwork for the [place] for financial help from her daughter.   Over 25 minutes visit, with 50% of face-to-face care.   Hyperlipidemia With her history of stroke, neurology had recommended Repatha or PCSK9 inhibitor, as she's been intolerant to multiple statins. She wanted to check into coverage first.   Lab Results  Component Value Date   CHOL 264 (H) 12/05/2016   HDL 58 12/05/2016   LDLCALC 185 (H) 12/05/2016   TRIG 105 12/05/2016   CHOLHDL 4.6 (H) 12/05/2016   She informs that Repatha will be covered.   Low back pain She's had persistent right low back pain that has limited her movement, when discussed last visit. This apparently has been a long standing issue including when hospitalized. She has been followed by physical medicine and rehab, recommended further follow up with her provider, treated for her spastic hemiplegia with dysport injections. She had MRI of lumbar spine done in Nov 2015 by Dr. Layne Benton, which showed shallow disc protrusion at L4-5, borderline spinal stenosis, and moderate facet hypertrophy at L2-3, and L3-4. She was treated with physical therapy and meloxicam previously.   Her back pain has been persistent and gotten worse. She hasn't seen Dr. Layne Benton since last visit. She denies any bowel or urinary incontinence. She barely takes  any tylenol for her back.   Health maintenance She had normal A1C and normal CMP at last visit. She had cancer screening and colonoscopy done in Jan 2008; repeat in 10 years. She had it done in [place], [place]. Her last mammogram was in Dec 2016, which showed no evidence of malignancy. She has not had bone density done yet; no known family history of osteoporosis or bone thinning.   She is due for Shingles vaccine, so she will receive Shingles vaccine at her pharmacy. She is also due for Tdap, but will defer at this time.   Hep C screening Patient reports having it done multiple times, as it was a job requirement. She states it was negative; checked between 2001 and 2008.   Patient Active Problem List   Diagnosis Date Noted  . HTN (hypertension) 07/26/2015  . Obesity (BMI 30.0-34.9) 07/26/2015  . Syncope 12/06/2014  . Spastic hemiplegia affecting nondominant side ([name]) 07/25/2014  . 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 Left hemiparesis ([name]) 06/10/2014  . Left-sided neglect 06/10/2014  . 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 Acute respiratory failure with hypoxia ([name]) 06/05/2014  . Cerebral infarction due to occlusion of right middle cerebral artery ([name]) 06/03/2014  . Cerebral infarct ([name]) 06/03/2014  . Altered mental status 06/03/2014  . GERD (gastroesophageal reflux disease) 04/12/2014  . Dyslipidemia- statin intol 02/07/2014  . Chest pain- Low risk Myoview  July 2015 02/06/2014   Past Medical History:  Diagnosis Date  . Back pain    arthritis  . CVA (cerebral infarction)   . Fall 11/2014   fell in bathroom, uses a cane  . Hypercholesteremia   . Hypertension   . Joint pain    back, history of   . Stroke ([name])   . Weakness    Past Surgical History:  Procedure Laterality Date  . APPENDECTOMY    . HEMORROIDECTOMY    . LOOP RECORDER IMPLANT  06/07/2014   MDT LINQ implanted by Dr [name] for  cryptogenic stroke  . LOOP RECORDER IMPLANT N/A 06/07/2014   Procedure: LOOP RECORDER IMPLANT;  Surgeon: [name], MD;  Location: [facility] CATH LAB;  Service: Cardiovascular;  Laterality: N/A;  . RADIOLOGY WITH ANESTHESIA N/A 06/03/2014   Procedure: RADIOLOGY WITH ANESTHESIA;  Surgeon: [name], MD;  Location: [facility];  Service: Radiology;  Laterality: N/A;  . TEE WITHOUT CARDIOVERSION N/A 06/07/2014   Procedure: TRANSESOPHAGEAL ECHOCARDIOGRAM (TEE);  Surgeon: [name], MD;  Location[facility] ENDOSCOPY;  Service: Cardiovascular;  Laterality: N/A;  . TONSILLECTOMY    . TUBAL LIGATION     Allergies  Allergen Reactions  . Tizanidine Itching  . Aspirin Nausea And Vomiting    Tolerated baby aspirin, but with more than once per day of full strength for aches and pains - had stomach upset.  No history of PUD/gastric bleeding known.   . Statins Other (See Comments)    Myalgias and chest pain (has tried Lipitor and Pravachol)   Prior to Admission medications   Medication Sig Start Date End Date Taking? Authorizing Provider  acetaminophen (TYLENOL) 325 MG tablet Take 650 mg by mouth every 6 (six) hours as needed (pain).     [provider]  aspirin 81 MG tablet Take 81 mg by mouth daily.    [provider]  clopidogrel (PLAVIX) 75 MG tablet Take 1 tablet (75 mg total) by mouth daily. 12/05/16   [name], MD  clotrimazole (LOTRIMIN) 1 % cream Apply 1 application topically 2 (two) times daily. 11/22/15   [name], MD  famotidine (PEPCID) 10 MG tablet Take 1 tablet (10 mg total) by mouth 2 (two) times daily. Patient taking differently: Take 10 mg by mouth daily as needed.  06/23/14   Love, [name], PA-C  loratadine (ALAVERT) 10 MG tablet Take 10 mg by mouth daily as needed for allergies.    [provider]  metoprolol succinate (TOPROL-XL) 25 MG 24 hr tablet Take 0.5 tablets (12.5 mg total) by mouth daily. 12/05/16   [name], MD  Multiple  Vitamin (MULTIVITAMIN WITH MINERALS) TABS tablet Take 1 tablet by mouth daily. Reported on 11/22/2015    [provider]  Polyethyl Glycol-Propyl Glycol (SYSTANE OP) Place 1 drop into both eyes daily as needed (dry eyeys).    [provider]  senna-docusate (SENOKOT-S) 8.6-50 MG per tablet Take 3 tablets by mouth 2 (two) times daily. Patient taking differently: Take 2 tablets by mouth 2 (two) times daily.  06/23/14   Bary Leriche, PA-C   Social History   Social History  . Marital status: Married    Spouse name: N/A  . Number of children: N/A  . Years of education: N/A   Occupational History  . Not on file.   Social History Main Topics  . Smoking status: Former Smoker    Years: 5.00  . Smokeless tobacco: Never Used  .  Alcohol use 0.6 oz/week    1 Glasses of wine per week     Comment: occasionally wine  . Drug use: No  . Sexual activity: Not on file   Other Topics Concern  . Not on file   Social History Narrative   Lives in [place]    Married. Education: [employer].    Review of Systems  Constitutional: Negative for chills, fatigue, fever and unexpected weight change.  Respiratory: Negative for cough.   Gastrointestinal: Negative for constipation, diarrhea, nausea and vomiting.  Musculoskeletal: Positive for back pain.  Skin: Negative for rash and wound.  Neurological: Negative for dizziness, weakness and headaches.       Objective:   Physical Exam  Constitutional: She is oriented to person, place, and time. She appears well-developed and well-nourished. No distress.  HENT:  Head: Normocephalic and atraumatic.  Eyes: EOM are normal. Pupils are equal, round, and reactive to light.  Neck: Neck supple.  Cardiovascular: Normal rate.   Pulmonary/Chest: Effort normal. No respiratory distress.  Musculoskeletal: Normal range of motion.  Tender along right lower paraspinals, no midline bony tenderness; no apparent rash, skin intact  Neurological: She is  alert and oriented to person, place, and time.  Skin: Skin is warm and dry.  Psychiatric: She has a normal mood and affect. Her behavior is normal.  Nursing note and vitals reviewed.   Vitals:   01/09/17 1423  BP: (!) 144/80  Pulse: 87  Resp: 18  Temp: 97 F (36.1 C)  TempSrc: Oral  SpO2: 98%  Weight: 200 lb (90.7 kg)  Height: 5\' 2"  (1.575 m)      Assessment & Plan:   Doniesha Landau is a 71 y.o. female Hyperlipidemia, unspecified hyperlipidemia type  - Plan for Repatha, as intolerant to previous statins, and with history of stroke, needed for secondary prevention. This was also recommended by her neurologist. Will check into dispense and injection technique to review with patient once prescription provided.  Encounter for screening colonoscopy - Plan: Ambulatory referral to Gastroenterology  -Refer to gastroenterology.  Need for shingles vaccine - Plan: Zoster Vac Recomb Adjuvanted [facility]) injection  -Prescription printed for dispensing at her pharmacy.  Estrogen deficiency - Plan: DG Bone Density  -Check bone density for screening  Chronic right-sided low back pain without sciatica - Plan: Ambulatory referral to Orthopedic Surgery  -Chronic low back pain, has been evaluated by Dr. Hal Morales previously. We'll refer back to that orthopedic practice, with Dr. Hal Morales to discuss options at this point as may need MRI versus physical therapy as initial trial. RTC precautions if acute worsening.   Meds ordered this encounter  Medications  . Zoster Vac Recomb Adjuvanted [facility]) injection    Sig: Inject 0.5 mLs into the muscle once. Repeat injection once in 2-6 months.    Dispense:  0.5 mL    Refill:  1   Patient Instructions   For your cholesterol, I will check into Repatha injection to order correct one and to discuss how to inject this medicine.   I will refer you to gastroenterologist locally for colonoscopy.   I will order bone density test.   I will refer you back to  Dr. Hal Morales to discuss your back pain. I will write for low dose of muscle relaxant to be used up to 3 times per day for now.   Drink at least 64 ounces of water daily. Consider a humidifier for the room where you sleep. Bathe once daily. Avoid using HOT water, as  it dries skin.  Avoid deodorant soaps (Dial is the worst!) and stick with gentle cleansers (I like Cetaphil Liquid Cleanser). After bathing, dry off completely, then apply a thick emollient cream (I like Cetaphil Moisturizing Cream). Apply the cream twice daily, or more!   We recommend that you schedule a mammogram for breast cancer screening. Typically, you do not need a referral to do this. Please contact a local imaging center to schedule your mammogram.  [facility] - [phone]  *ask for the Radiology Department [facility] ([place]) - [phone] or [phone]  [facility] - [phone] [place] [phone] [facility] - [phone]  *ask for the [facility] - [phone]  *ask for the Radiology Department [facility] - [phone]  *ask for the [facility] - [phone]    IF you received an x-ray today, you will receive an invoice from [facility]. Please contact [facility]iology at [phone] with questions or concerns regarding your invoice.   IF you received labwork today, you will receive an invoice from [facility]. Please contact [facility] at [phone] with questions or concerns regarding your invoice.   Our billing staff will not be able to assist you with questions regarding bills from these companies.  You will be contacted with the lab results as soon as they are available. The fastest way to get your results is to activate your My Chart account. Instructions are located on the last page of this paperwork. If you have not heard from [place]  regarding the results in 2 weeks, please contact this office.       I personally performed the services described in this documentation, which was scribed in my presence. The recorded information has been reviewed and considered for accuracy and completeness, addended by me as needed, and agree with information above.  Signed,   [name], MD [facility].  01/12/17 6:26 PM

## 2017-01-16 DIAGNOSIS — M545 Low back pain: Secondary | ICD-10-CM | POA: Diagnosis not present

## 2017-01-16 DIAGNOSIS — M48061 Spinal stenosis, lumbar region without neurogenic claudication: Secondary | ICD-10-CM | POA: Diagnosis not present

## 2017-01-19 DIAGNOSIS — E669 Obesity, unspecified: Secondary | ICD-10-CM | POA: Diagnosis not present

## 2017-01-19 DIAGNOSIS — I69354 Hemiplegia and hemiparesis following cerebral infarction affecting left non-dominant side: Secondary | ICD-10-CM | POA: Diagnosis not present

## 2017-01-19 DIAGNOSIS — M47816 Spondylosis without myelopathy or radiculopathy, lumbar region: Secondary | ICD-10-CM | POA: Diagnosis not present

## 2017-01-19 DIAGNOSIS — I10 Essential (primary) hypertension: Secondary | ICD-10-CM | POA: Diagnosis not present

## 2017-01-19 DIAGNOSIS — E785 Hyperlipidemia, unspecified: Secondary | ICD-10-CM | POA: Diagnosis not present

## 2017-01-19 DIAGNOSIS — M48061 Spinal stenosis, lumbar region without neurogenic claudication: Secondary | ICD-10-CM | POA: Diagnosis not present

## 2017-01-21 DIAGNOSIS — E785 Hyperlipidemia, unspecified: Secondary | ICD-10-CM | POA: Diagnosis not present

## 2017-01-21 DIAGNOSIS — M47816 Spondylosis without myelopathy or radiculopathy, lumbar region: Secondary | ICD-10-CM | POA: Diagnosis not present

## 2017-01-21 DIAGNOSIS — E669 Obesity, unspecified: Secondary | ICD-10-CM | POA: Diagnosis not present

## 2017-01-21 DIAGNOSIS — M48061 Spinal stenosis, lumbar region without neurogenic claudication: Secondary | ICD-10-CM | POA: Diagnosis not present

## 2017-01-21 DIAGNOSIS — I10 Essential (primary) hypertension: Secondary | ICD-10-CM | POA: Diagnosis not present

## 2017-01-21 DIAGNOSIS — I69354 Hemiplegia and hemiparesis following cerebral infarction affecting left non-dominant side: Secondary | ICD-10-CM | POA: Diagnosis not present

## 2017-01-23 ENCOUNTER — Ambulatory Visit (INDEPENDENT_AMBULATORY_CARE_PROVIDER_SITE_OTHER): Payer: Medicare Other | Admitting: *Deleted

## 2017-01-23 DIAGNOSIS — I63411 Cerebral infarction due to embolism of right middle cerebral artery: Secondary | ICD-10-CM | POA: Diagnosis not present

## 2017-01-23 NOTE — Progress Notes (Signed)
[facility] Summary Report / Loop Recorder 

## 2017-01-24 DIAGNOSIS — M47816 Spondylosis without myelopathy or radiculopathy, lumbar region: Secondary | ICD-10-CM | POA: Diagnosis not present

## 2017-01-24 DIAGNOSIS — E669 Obesity, unspecified: Secondary | ICD-10-CM | POA: Diagnosis not present

## 2017-01-24 DIAGNOSIS — M48061 Spinal stenosis, lumbar region without neurogenic claudication: Secondary | ICD-10-CM | POA: Diagnosis not present

## 2017-01-24 DIAGNOSIS — E785 Hyperlipidemia, unspecified: Secondary | ICD-10-CM | POA: Diagnosis not present

## 2017-01-24 DIAGNOSIS — I10 Essential (primary) hypertension: Secondary | ICD-10-CM | POA: Diagnosis not present

## 2017-01-24 DIAGNOSIS — I69354 Hemiplegia and hemiparesis following cerebral infarction affecting left non-dominant side: Secondary | ICD-10-CM | POA: Diagnosis not present

## 2017-01-29 DIAGNOSIS — I10 Essential (primary) hypertension: Secondary | ICD-10-CM | POA: Diagnosis not present

## 2017-01-29 DIAGNOSIS — E669 Obesity, unspecified: Secondary | ICD-10-CM | POA: Diagnosis not present

## 2017-01-29 DIAGNOSIS — I69354 Hemiplegia and hemiparesis following cerebral infarction affecting left non-dominant side: Secondary | ICD-10-CM | POA: Diagnosis not present

## 2017-01-29 DIAGNOSIS — E785 Hyperlipidemia, unspecified: Secondary | ICD-10-CM | POA: Diagnosis not present

## 2017-01-29 DIAGNOSIS — M47816 Spondylosis without myelopathy or radiculopathy, lumbar region: Secondary | ICD-10-CM | POA: Diagnosis not present

## 2017-01-29 DIAGNOSIS — M48061 Spinal stenosis, lumbar region without neurogenic claudication: Secondary | ICD-10-CM | POA: Diagnosis not present

## 2017-01-30 LAB — CUP PACEART REMOTE DEVICE CHECK
Implantable Pulse Generator Implant Date: 20151117
MDC IDC SESS DTM: 20180705054500

## 2017-02-05 DIAGNOSIS — M47816 Spondylosis without myelopathy or radiculopathy, lumbar region: Secondary | ICD-10-CM | POA: Diagnosis not present

## 2017-02-05 DIAGNOSIS — E785 Hyperlipidemia, unspecified: Secondary | ICD-10-CM | POA: Diagnosis not present

## 2017-02-05 DIAGNOSIS — I10 Essential (primary) hypertension: Secondary | ICD-10-CM | POA: Diagnosis not present

## 2017-02-05 DIAGNOSIS — M48061 Spinal stenosis, lumbar region without neurogenic claudication: Secondary | ICD-10-CM | POA: Diagnosis not present

## 2017-02-05 DIAGNOSIS — I69354 Hemiplegia and hemiparesis following cerebral infarction affecting left non-dominant side: Secondary | ICD-10-CM | POA: Diagnosis not present

## 2017-02-05 DIAGNOSIS — E669 Obesity, unspecified: Secondary | ICD-10-CM | POA: Diagnosis not present

## 2017-02-07 DIAGNOSIS — M47816 Spondylosis without myelopathy or radiculopathy, lumbar region: Secondary | ICD-10-CM | POA: Diagnosis not present

## 2017-02-07 DIAGNOSIS — I10 Essential (primary) hypertension: Secondary | ICD-10-CM | POA: Diagnosis not present

## 2017-02-07 DIAGNOSIS — M48061 Spinal stenosis, lumbar region without neurogenic claudication: Secondary | ICD-10-CM | POA: Diagnosis not present

## 2017-02-07 DIAGNOSIS — E669 Obesity, unspecified: Secondary | ICD-10-CM | POA: Diagnosis not present

## 2017-02-07 DIAGNOSIS — E785 Hyperlipidemia, unspecified: Secondary | ICD-10-CM | POA: Diagnosis not present

## 2017-02-07 DIAGNOSIS — I69354 Hemiplegia and hemiparesis following cerebral infarction affecting left non-dominant side: Secondary | ICD-10-CM | POA: Diagnosis not present

## 2017-02-10 ENCOUNTER — Telehealth: Payer: Self-pay | Admitting: *Deleted

## 2017-02-10 DIAGNOSIS — I10 Essential (primary) hypertension: Secondary | ICD-10-CM | POA: Diagnosis not present

## 2017-02-10 DIAGNOSIS — M47816 Spondylosis without myelopathy or radiculopathy, lumbar region: Secondary | ICD-10-CM | POA: Diagnosis not present

## 2017-02-10 DIAGNOSIS — E785 Hyperlipidemia, unspecified: Secondary | ICD-10-CM | POA: Diagnosis not present

## 2017-02-10 DIAGNOSIS — I69354 Hemiplegia and hemiparesis following cerebral infarction affecting left non-dominant side: Secondary | ICD-10-CM | POA: Diagnosis not present

## 2017-02-10 DIAGNOSIS — E669 Obesity, unspecified: Secondary | ICD-10-CM | POA: Diagnosis not present

## 2017-02-10 DIAGNOSIS — M48061 Spinal stenosis, lumbar region without neurogenic claudication: Secondary | ICD-10-CM | POA: Diagnosis not present

## 2017-02-10 NOTE — Telephone Encounter (Signed)
Attempted to reach patient to request manual LINQ transmission for review of "AF" episodes.  Available ECG from 02/05/17 appears SR w/undersensing and oversensing.  Unable to get call through, will try again tomorrow.

## 2017-02-11 ENCOUNTER — Encounter: Payer: Self-pay | Admitting: Vascular Surgery

## 2017-02-11 ENCOUNTER — Encounter: Payer: Self-pay | Admitting: Internal Medicine

## 2017-02-11 NOTE — Telephone Encounter (Signed)
Spoke with patient's daughter, [name], ([place] sending a manual transmission for review of "AF" episodes. Available ECG from 02/08/17 appears SR with undersensing and noise. Transmission sent successfully. Advised patient will review Full Report once received.

## 2017-02-11 NOTE — Telephone Encounter (Signed)
Received manual transmission. 4 AF episodes appear SR with undersensing and oversensing noise.

## 2017-02-13 DIAGNOSIS — M48061 Spinal stenosis, lumbar region without neurogenic claudication: Secondary | ICD-10-CM | POA: Diagnosis not present

## 2017-02-13 DIAGNOSIS — I10 Essential (primary) hypertension: Secondary | ICD-10-CM | POA: Diagnosis not present

## 2017-02-13 DIAGNOSIS — E785 Hyperlipidemia, unspecified: Secondary | ICD-10-CM | POA: Diagnosis not present

## 2017-02-13 DIAGNOSIS — M47816 Spondylosis without myelopathy or radiculopathy, lumbar region: Secondary | ICD-10-CM | POA: Diagnosis not present

## 2017-02-13 DIAGNOSIS — I69354 Hemiplegia and hemiparesis following cerebral infarction affecting left non-dominant side: Secondary | ICD-10-CM | POA: Diagnosis not present

## 2017-02-13 DIAGNOSIS — E669 Obesity, unspecified: Secondary | ICD-10-CM | POA: Diagnosis not present

## 2017-02-14 DIAGNOSIS — M48061 Spinal stenosis, lumbar region without neurogenic claudication: Secondary | ICD-10-CM | POA: Diagnosis not present

## 2017-02-14 DIAGNOSIS — M545 Low back pain: Secondary | ICD-10-CM | POA: Diagnosis not present

## 2017-02-17 DIAGNOSIS — E785 Hyperlipidemia, unspecified: Secondary | ICD-10-CM | POA: Diagnosis not present

## 2017-02-17 DIAGNOSIS — I69354 Hemiplegia and hemiparesis following cerebral infarction affecting left non-dominant side: Secondary | ICD-10-CM | POA: Diagnosis not present

## 2017-02-17 DIAGNOSIS — I10 Essential (primary) hypertension: Secondary | ICD-10-CM | POA: Diagnosis not present

## 2017-02-17 DIAGNOSIS — M48061 Spinal stenosis, lumbar region without neurogenic claudication: Secondary | ICD-10-CM | POA: Diagnosis not present

## 2017-02-17 DIAGNOSIS — M47816 Spondylosis without myelopathy or radiculopathy, lumbar region: Secondary | ICD-10-CM | POA: Diagnosis not present

## 2017-02-17 DIAGNOSIS — E669 Obesity, unspecified: Secondary | ICD-10-CM | POA: Diagnosis not present

## 2017-02-19 DIAGNOSIS — E785 Hyperlipidemia, unspecified: Secondary | ICD-10-CM | POA: Diagnosis not present

## 2017-02-19 DIAGNOSIS — E669 Obesity, unspecified: Secondary | ICD-10-CM | POA: Diagnosis not present

## 2017-02-19 DIAGNOSIS — M47816 Spondylosis without myelopathy or radiculopathy, lumbar region: Secondary | ICD-10-CM | POA: Diagnosis not present

## 2017-02-19 DIAGNOSIS — I10 Essential (primary) hypertension: Secondary | ICD-10-CM | POA: Diagnosis not present

## 2017-02-19 DIAGNOSIS — I69354 Hemiplegia and hemiparesis following cerebral infarction affecting left non-dominant side: Secondary | ICD-10-CM | POA: Diagnosis not present

## 2017-02-19 DIAGNOSIS — M48061 Spinal stenosis, lumbar region without neurogenic claudication: Secondary | ICD-10-CM | POA: Diagnosis not present

## 2017-02-20 ENCOUNTER — Encounter: Payer: Self-pay | Admitting: Neurology

## 2017-02-20 ENCOUNTER — Telehealth: Payer: Self-pay

## 2017-02-20 ENCOUNTER — Ambulatory Visit (INDEPENDENT_AMBULATORY_CARE_PROVIDER_SITE_OTHER): Payer: Medicare Other | Admitting: Neurology

## 2017-02-20 VITALS — BP 115/72 | HR 85 | Wt 205.2 lb

## 2017-02-20 DIAGNOSIS — G8110 Spastic hemiplegia affecting unspecified side: Secondary | ICD-10-CM | POA: Diagnosis not present

## 2017-02-20 DIAGNOSIS — I63411 Cerebral infarction due to embolism of right middle cerebral artery: Secondary | ICD-10-CM

## 2017-02-20 NOTE — Progress Notes (Signed)
[facility] [address] [place]. [place] [address] [phone]       OFFICE FOLLOW-UP NOTE  [name] Date of Birth:  1946-03-11 Medical Record Number:  [id number]   HPI: 66 year African American ladywho awakened normal on 06/03/14. Went to have an MRI and returned normal. She did complain of not feeling well and laid down for a nap. When her family went to awaken her they noted a left facial droop and left sided weakness. EMS was called at that time and the patient was brought in as a code stroke. Initial NIHSS of 18.presenting with left hemiplegia, left neglect and HH, right gaze. NIH stroke scale on admission was 18. She did not receive IV t-PA due to out of window. CTA showed right M1 cut off and Dr Estanislado Pandy neuroradiologist in IR performed ENDOVASCULAR COMPLETE REVASCULARIZATION OF OCCLUDED RIGHT MIDDLE CEREBRAL ARTERY DOMINANT INFERIOR DIVISION USING SUPERSELECTIVE INTRA-ARTERIAL INTRACRANIAL INTEGRELIN AND 2 PASSES WITH THE SOLITAIRE FR Fuig was monitored in intensive care unit and pulmonary critical care followed her initially as primary attending. She was subsequently extubated and found to falling commands though left upper extremity weakness and plegia persisted. Left lower extremity strength improved to 4/5. Patient's prep was tightly controlled. Postprocedure brain imaging did not reveal significant hemorrhagic transformation. Patient notes was transferred to the neurology floor where she made steady progress. She was seen by physical occupational and speech therapy. Transthoracic echo showed no significant cardiac source of embolism. TEE also showed no cardiac source of embolism. Patient had LEt venous Doppler which was negative for DVT. She had loop recorder placed to look for paroxysmal atrial fibrillation. She was felt to be a good candidate for inpatient rehabilitation and hence was transferred there in a stable condition on  06/08/14. Her neurological deficits on day of discharge included mild left facial droop but no dysarthria. Left upper extremity was plegic 0/5. There was very mild weakness of the left lower extremity without any drift. She was started on aspirin. She has subsequently been switched to Plavix for GI upset . She has made steady progress with outpatient physical and occupational therapy and is now able to move the left arm above the shoulder but has no useful function in the left fingers. She has been getting Botox injections by Dr. Barbaraann Cao which seems to have helped her pain and spasticity. She however still needs a lot of help from her daughter for bathing, using the bathroom, cocaine and changing her clothes. She is able to ambulate with a cane and has had no recent falls. Her speech and swallowing have improved. She has not had any recurrent stroke or TIA symptoms Update 12/15/2014 : She returns for follow-up after last visit 4 months ago. She is accompanied by her daughter. Patient had a fall in the bathroom a month ago and needed several stitches. She states she slipped in the bathroom. She is walking with a cane and does reasonably well most of the time. She still has pain and spasticity in the left shoulder and arm for which she gets Botox injections by Dr. Dianna Limbo which seem to help her. She remains on Plavix which is tolerating well without bleeding or bruising. She states her blood pressure is well controlled though it is slightly elevated today at 150/88 in office. She has had no recurrent stroke or TIA symptoms but feels that after her fall her gait and balance is slightly off and on not to back to baseline. She would  like to be referred back to physical therapy for gait and balance training. She has not had any recent lipid profile checked but remains on fish oil. Update 06/26/2015 : She returns for follow-up after last visit with me 6 months ago. She is accompanied by her daughter. Patient continues  to do well and has not had any recurrent stroke or TIA symptoms. However she did not undergo lab work or carotid dopplers that I had ordered at last visit and is not clear as to why. She however underwent an MRI scan of the brain done on 06/22/15 ordered by Dr. Luanne Bras which I personally reviewed shows encephalomalacia in the right MCA territory but no acute infarct. MRA of the brain shows moderate right middle cerebral artery restenosis in the M1 segment. Patient remains on aspirin and Plavix just she is tolerating well without significant bleeding and only minor bruising. She is had fortunately no falls or serious injuries. She continues to see Dr. Kayren Eaves for Botox into her left upper extremity which seems to be helping her. She is living at home with her daughter and needs only help with activities like taking a shower or dressing herself. She is able to walk independently without cane with a fairly steady gait and has not had any major falls. Update 02/21/2016 : She returns for follow-up today accompanied by her daughter after last visit 7 months ago. She continues to do well and has not had any recurrent stroke or TIA symptoms. She continues to have spastic left hemiparesis. She needs help with activities like taking a shower and dressing herself but is able to ambulate independently. She is tolerating aspirin and Plavix without bruising or bleeding. She states her blood pressure is usually quite good and today it is 117/60. She had to discontinue Crestor due to muscle aches and pains. She has a long standing history of statin intolerance and has tried all possible statins. She has not seen Dr. Kayren Eaves for several months now for Botox but plans to do so soon. She has not had follow-up carotid ultrasound and Doppler studies done for more than a year now. Update 02/20/2017 : She returns for follow-up after last visit a year ago. She continues to do well without recurrent stroke or TIA symptoms.  However she feels she's had some physical decline and has fallen a few times and lost her confidence to walk independently independently for long distances. She barely extended to the restroom. The patient is also having significant back pain and numbness planning on getting epidural steroid injections at [facility] when orthopedics. She is currently getting home physical therapy which is helping. She states her blood pressure is well controlled rate is 115/76. She did not have follow-up Dopplers after last visit which had ordered for unclear reasons. She does have an upcoming appointment with vascular surgeon Dr. Doren Custard next week. She remains on aspirin and Plavix and does complain of the increased bruising but fortunately no bleeding episodes. ROS:   14 system review of systems is positive for   back pain, fatigue, eye itching, walking difficulty, anxiety, depression and all the systems negative. PMH:  Past Medical History:  Diagnosis Date  . Back pain    arthritis  . CVA (cerebral infarction)   . Fall 11/2014   fell in bathroom, uses a cane  . Hypercholesteremia   . Hypertension   . Joint pain    back, history of   . Stroke ([place])   . Weakness  Social History:  Social History   Social History  . Marital status: Married    Spouse name: N/A  . Number of children: N/A  . Years of education: N/A   Occupational History  . Not on file.   Social History Main Topics  . Smoking status: Former Smoker    Years: 5.00  . Smokeless tobacco: Never Used  . Alcohol use 0.6 oz/week    1 Glasses of wine per week     Comment: occasionally wine  . Drug use: No  . Sexual activity: Not on file   Other Topics Concern  . Not on file   Social History Narrative   Lives in [place]    Married. Education: [employer].     Medications:   Current Outpatient Prescriptions on File Prior to Visit  Medication Sig Dispense Refill  . acetaminophen (TYLENOL) 325 MG tablet Take 650 mg by mouth every 6 (six)  hours as needed (pain).     Marland Kitchen aspirin 81 MG tablet Take 81 mg by mouth daily.    . famotidine (PEPCID) 10 MG tablet Take 1 tablet (10 mg total) by mouth 2 (two) times daily. (Patient taking differently: Take 10 mg by mouth daily as needed. ) 60 tablet 1  . loratadine (ALAVERT) 10 MG tablet Take 10 mg by mouth daily as needed for allergies.    . metoprolol succinate (TOPROL-XL) 25 MG 24 hr tablet Take 0.5 tablets (12.5 mg total) by mouth daily. 90 tablet 0  . Multiple Vitamin (MULTIVITAMIN WITH MINERALS) TABS tablet Take 1 tablet by mouth daily. Reported on 11/22/2015    . Polyethyl Glycol-Propyl Glycol (SYSTANE OP) Place 1 drop into both eyes daily as needed (dry eyeys).    Marland Kitchen senna-docusate (SENOKOT-S) 8.6-50 MG per tablet Take 3 tablets by mouth 2 (two) times daily. (Patient taking differently: Take 2 tablets by mouth 2 (two) times daily. ) 100 tablet 1   No current facility-administered medications on file prior to visit.     Allergies:   Allergies  Allergen Reactions  . Tizanidine Itching  . Aspirin Nausea And Vomiting    Tolerated baby aspirin, but with more than once per day of full strength for aches and pains - had stomach upset.  No history of PUD/gastric bleeding known.   . Statins Other (See Comments)    Myalgias and chest pain (has tried Lipitor and Pravachol)    Physical Exam General: well developed, well nourished middle aged African-American lady, seated, in no evident distress Head: head normocephalic and atraumatic.  Neck: supple with no carotid or supraclavicular bruits Cardiovascular: regular rate and rhythm, no murmurs Musculoskeletal: no deformity Skin:  no rash/petichiae Vascular:  Normal pulses all extremities Vitals:   02/20/17 1145  BP: 115/72  Pulse: 85   Neurologic Exam Mental Status: Awake and fully alert. Oriented to place and time. Recent and remote memory intact. Attention span, concentration and fund of knowledge appropriate. Mood and affect  appropriate.  Cranial Nerves: Fundoscopic exam  not done.   Pupils equal, briskly reactive to light. Extraocular movements full without nystagmus. Visual fields full to confrontation. Hearing intact. Facial sensation intact. Mild left lower facial weakness. Tongue, palate moves normally and symmetrically.  Motor: Spastic left hemiparesis with 3/5 proximal left approximately and 0/5 distal left upper extremity strength. 4+/5 left lower extremity strength with mild weakness of ankle dorsiflexors and hip flexors. Increased tone on the left with spasticity at the left shoulder and elbow extensors. Forced flexion contracture of the  left hand and fingers. Sensory.: intact to touch ,pinprick .position and vibratory sensation.  Coordination: Impaired on the left due to weakness and normal on the right  Gait and Station:Spastic hemiplegic gait with circumduction of the left foot. DTRs: 2+ and asymmetric brisker on the left. Toes downgoing.   NIHSS  4 Modified Rankin  3   ASSESSMENT: 22 year African-American lady with embolic right middle cerebral artery cerebral infarction due to occlusion of right middle cerebral artery of embolic etiology without definite identified source s/p endovascular revascularization of an occluded right middle cerebral artery and dominant inferior division using superselective intra-arterial intracranial Integrilin and solitaire  r stent retrieval device in November 2015   Patient has made a modest recovery with residual spastic left hemiplegia. Vascular risk factors of hypertension and hyperlipidemia.    PLAN: I had a long d/w patient and daughter about her remote stroke, spastic hemiplegia, risk for recurrent stroke/TIAs, personally independently reviewed imaging studies and stroke evaluation results and answered questions.Continue aspirin 81 mg daily  But stop clopidogrel 75 mg daily   as I do not see any advantage of long-term dual antiplatelet therapy but significant  increased bleeding risk for secondary stroke prevention and maintain strict control of hypertension with blood pressure goal below 130/90, diabetes with hemoglobin A1c goal below 6.5% and lipids with LDL cholesterol goal below 70 mg/dL. I also advised the patient to eat a healthy diet with plenty of whole grains, cereals, fruits and vegetables, exercise regularly and maintain ideal body weight . Check follow-up carotid ultrasound   study. She was advised to keep her upcoming appointment with Dr. Doren Custard. Vascular surgeon No routine scheduled appointment with me is necessary that she may be referred back in the future as needed only. Greater than 50% time during this 25 minute visit was spent on counseling and coordination of care about stroke and spasticity. Followup in the future with me in one year or call earlier if necessary     Antony Contras, MD Note: This document was prepared with digital dictation and possible smart phrase technology. Any transcriptional errors that result from this process are unintentional

## 2017-02-20 NOTE — Telephone Encounter (Signed)
Rn call [facility] at [phone]. Rn stated someone from their office fax a signed completed clearance form done by the PCP. Rn stated if they need a neurological clearance it needs to be fax to [phone]. Rn stated Dr.Sethi will be out of the office next week working in the hospital. The rep took message and will forward it.

## 2017-02-20 NOTE — Patient Instructions (Signed)
I had a long d/w patient and daughter about her remote stroke, spastic hemiplegia, risk for recurrent stroke/TIAs, personally independently reviewed imaging studies and stroke evaluation results and answered questions.Continue aspirin 81 mg daily  But stop clopidogrel 75 mg daily   as I do not see any advantage of long-term dual antiplatelet therapy but significant increased bleeding risk for secondary stroke prevention and maintain strict control of hypertension with blood pressure goal below 130/90, diabetes with hemoglobin A1c goal below 6.5% and lipids with LDL cholesterol goal below 70 mg/dL. I also advised the patient to eat a healthy diet with plenty of whole grains, cereals, fruits and vegetables, exercise regularly and maintain ideal body weight . Check follow-up carotid ultrasound   study. She was advised to keep her upcoming appointment with Dr. Doren Custard. Vascular surgeon No routine scheduled appointment with me is necessary that she may be referred back in the future as needed only.

## 2017-02-20 NOTE — Telephone Encounter (Signed)
Clearance form fax to [phone]. Form fax twice and confirmed for patients lumbar injection.

## 2017-02-24 ENCOUNTER — Ambulatory Visit (INDEPENDENT_AMBULATORY_CARE_PROVIDER_SITE_OTHER): Payer: Medicare Other | Admitting: *Deleted

## 2017-02-24 DIAGNOSIS — I63411 Cerebral infarction due to embolism of right middle cerebral artery: Secondary | ICD-10-CM | POA: Diagnosis not present

## 2017-02-25 DIAGNOSIS — M47816 Spondylosis without myelopathy or radiculopathy, lumbar region: Secondary | ICD-10-CM | POA: Diagnosis not present

## 2017-02-25 DIAGNOSIS — M48061 Spinal stenosis, lumbar region without neurogenic claudication: Secondary | ICD-10-CM | POA: Diagnosis not present

## 2017-02-25 DIAGNOSIS — E669 Obesity, unspecified: Secondary | ICD-10-CM | POA: Diagnosis not present

## 2017-02-25 DIAGNOSIS — E785 Hyperlipidemia, unspecified: Secondary | ICD-10-CM | POA: Diagnosis not present

## 2017-02-25 DIAGNOSIS — I69354 Hemiplegia and hemiparesis following cerebral infarction affecting left non-dominant side: Secondary | ICD-10-CM | POA: Diagnosis not present

## 2017-02-25 DIAGNOSIS — I10 Essential (primary) hypertension: Secondary | ICD-10-CM | POA: Diagnosis not present

## 2017-02-25 NOTE — Progress Notes (Signed)
[facility] Summary Report / Loop Recorder 

## 2017-02-26 ENCOUNTER — Encounter: Payer: Self-pay | Admitting: Vascular Surgery

## 2017-02-26 ENCOUNTER — Ambulatory Visit (HOSPITAL_COMMUNITY)
Admission: RE | Admit: 2017-02-26 | Discharge: 2017-02-26 | Disposition: A | Discharge status: Home / Self Care | Payer: Medicare Other | Source: Ambulatory Visit | Attending: Vascular Surgery | Admitting: Vascular Surgery

## 2017-02-26 ENCOUNTER — Ambulatory Visit (INDEPENDENT_AMBULATORY_CARE_PROVIDER_SITE_OTHER): Payer: Medicare Other | Admitting: Vascular Surgery

## 2017-02-26 VITALS — BP 126/64 | HR 68 | Temp 97.4°F | Resp 18 | Ht 62.0 in | Wt 205.0 lb

## 2017-02-26 DIAGNOSIS — I10 Essential (primary) hypertension: Secondary | ICD-10-CM | POA: Diagnosis not present

## 2017-02-26 DIAGNOSIS — I70209 Unspecified atherosclerosis of native arteries of extremities, unspecified extremity: Secondary | ICD-10-CM | POA: Insufficient documentation

## 2017-02-26 DIAGNOSIS — Z87891 Personal history of nicotine dependence: Secondary | ICD-10-CM | POA: Diagnosis not present

## 2017-02-26 DIAGNOSIS — G8110 Spastic hemiplegia affecting unspecified side: Secondary | ICD-10-CM | POA: Insufficient documentation

## 2017-02-26 DIAGNOSIS — I6529 Occlusion and stenosis of unspecified carotid artery: Secondary | ICD-10-CM | POA: Diagnosis not present

## 2017-02-26 DIAGNOSIS — E785 Hyperlipidemia, unspecified: Secondary | ICD-10-CM | POA: Insufficient documentation

## 2017-02-26 DIAGNOSIS — I872 Venous insufficiency (chronic) (peripheral): Secondary | ICD-10-CM | POA: Diagnosis not present

## 2017-02-26 DIAGNOSIS — R0989 Other specified symptoms and signs involving the circulatory and respiratory systems: Secondary | ICD-10-CM | POA: Diagnosis present

## 2017-02-26 DIAGNOSIS — R938 Abnormal findings on diagnostic imaging of other specified body structures: Secondary | ICD-10-CM | POA: Diagnosis not present

## 2017-02-26 NOTE — Progress Notes (Signed)
Patient name: Mary Le MRN: [id number] DOB: October 26, 1945 Sex: female  REASON FOR VISIT:    Follow up of peripheral vascular disease and chronic venous insufficiency.  HPI:   Mary Le is a pleasant 71 y.o. female who I last saw on 02/28/2016. She was referred with left leg and foot pain and edema. The patient had noted left-sided swelling since he had a right brain stroke in 2015. The patient was unaware of any previous history of DVT. At the time of this visit she was noted to have deep venous reflux in the left common femoral vein and some reflux at the saphenofemoral junction. She had dampened monophasic Doppler signals in both feet. Thus I think she had combined chronic venous insufficiency and peripheral vascular disease. She comes in for a 1 year follow up visit.  She states that the swelling in her left leg and left arm have really not changed in the last year. She is able to elevate her leg and this does not cause any pain in her foot. She does not describe any claudication although her activity is very limited. She denies any rest pain or history of nonhealing ulcers. She is not a smoker.  Past Medical History:  Diagnosis Date  . Back pain    arthritis  . CVA (cerebral infarction)   . Fall 11/2014   fell in bathroom, uses a cane  . Hypercholesteremia   . Hypertension   . Joint pain    back, history of   . Stroke ([place])   . Weakness     Family History  Problem Relation Age of Onset  . Coronary artery disease Mother 63       MI  . Kidney disease Father     SOCIAL HISTORY: Social History  Substance Use Topics  . Smoking status: Former Smoker    Years: 5.00  . Smokeless tobacco: Never Used  . Alcohol use 0.6 oz/week    1 Glasses of wine per week     Comment: occasionally wine    Allergies  Allergen Reactions  . Tizanidine Itching  . Aspirin Nausea And Vomiting    Tolerated baby aspirin, but with more than once per day of full strength for aches and pains - had  stomach upset.  No history of PUD/gastric bleeding known.   . Statins Other (See Comments)    Myalgias and chest pain (has tried Lipitor and Pravachol)    Current Outpatient Prescriptions  Medication Sig Dispense Refill  . acetaminophen (TYLENOL) 325 MG tablet Take 650 mg by mouth every 6 (six) hours as needed (pain).     Marland Kitchen aspirin 81 MG tablet Take 81 mg by mouth daily.    . famotidine (PEPCID) 10 MG tablet Take 1 tablet (10 mg total) by mouth 2 (two) times daily. (Patient taking differently: Take 10 mg by mouth daily as needed. ) 60 tablet 1  . lisinopril (PRINIVIL,ZESTRIL) 20 MG tablet Take by mouth.    . loratadine (ALAVERT) 10 MG tablet Take 10 mg by mouth daily as needed for allergies.    . metoprolol succinate (TOPROL-XL) 25 MG 24 hr tablet Take 0.5 tablets (12.5 mg total) by mouth daily. 90 tablet 0  . Multiple Vitamin (MULTIVITAMIN WITH MINERALS) TABS tablet Take 1 tablet by mouth daily. Reported on 11/22/2015    . Polyethyl Glycol-Propyl Glycol (SYSTANE OP) Place 1 drop into both eyes daily as needed (dry eyeys).    Marland Kitchen senna-docusate (SENOKOT-S) 8.6-50 MG per tablet Take 3  tablets by mouth 2 (two) times daily. (Patient taking differently: Take 2 tablets by mouth 2 (two) times daily. ) 100 tablet 1   No current facility-administered medications for this visit.     REVIEW OF SYSTEMS:  [X]  denotes positive finding, [ ]  denotes negative finding Cardiac  Comments:  Chest pain or chest pressure:    Shortness of breath upon exertion:    Short of breath when lying flat:    Irregular heart rhythm:        Vascular    Pain in calf, thigh, or hip brought on by ambulation:    Pain in feet at night that wakes you up from your sleep:     Blood clot in your veins:    Leg swelling:  X       Pulmonary    Oxygen at home:    Productive cough:     Wheezing:         Neurologic    Sudden weakness in arms or legs:     Sudden numbness in arms or legs:     Sudden onset of difficulty speaking  or slurred speech:    Temporary loss of vision in one eye:     Problems with dizziness:         Gastrointestinal    Blood in stool:     Vomited blood:         Genitourinary    Burning when urinating:     Blood in urine:        Psychiatric    Major depression:         Hematologic    Bleeding problems:    Problems with blood clotting too easily:        Skin    Rashes or ulcers:        Constitutional    Fever or chills:     PHYSICAL EXAM:   Vitals:   02/26/17 1248  BP: 126/64  Pulse: 68  Resp: 18  Temp: (!) 97.4 F (36.3 C)  TempSrc: Oral  SpO2: 96%  Weight: 205 lb (93 kg)  Height: 5\' 2"  (1.575 m)    GENERAL: The patient is a well-nourished female, in no acute distress. The vital signs are documented above. CARDIAC: There is a regular rate and rhythm.  VASCULAR: I do not detect carotid bruits. I cannot palpate pedal pulses however both feet are warm and well-perfused. She has moderate left upper extremity swelling and left lower extremity swelling which is chronic. PULMONARY: There is good air exchange bilaterally without wheezing or rales. ABDOMEN: Soft and non-tender with normal pitched bowel sounds.  MUSCULOSKELETAL: There are no major deformities or cyanosis. NEUROLOGIC: No focal weakness or paresthesias are detected. SKIN: There are no ulcers or rashes noted. PSYCHIATRIC: The patient has a normal affect.  DATA:    ARTERIAL DOPPLER STUDY: I have independently interpreted her arterial Doppler study today.  On the left side, she has monophasic Doppler signals with an ABI of 63%. Toe pressure on the left is 79 mmHg.  On the right side she has monophasic Doppler signals. ABI on the right is 63% with a toe pressure 77 mmHg.  MEDICAL ISSUES:   COMBINED CHRONIC VENOUS INSUFFICIENCY AND INFRAINGUINAL ARTERIAL OCCLUSIVE DISEASE: Based on her previous duplex scan she does have some mild chronic venous insufficiency with reflux in the common femoral vein on the  left. We have discussed the importance of intermittent leg elevation in the proper positioning for this and  this does help her. She does also have evidence of infrainguinal arterial occlusive disease however on the left she has a toe pressure of 79 mmHg, and an ABI of 63% which I think is adequate. The fact that she can elevate her leg without having any rest pain is also a good sign. I have encouraged her to stay as active as possible. I've encouraged to continue to elevate her leg during the day. We have also discussed the importance of nutrition. I'll see her back as needed.  Deitra Mayo [facility] [phone]

## 2017-02-27 ENCOUNTER — Other Ambulatory Visit: Payer: Self-pay | Admitting: Family Medicine

## 2017-02-27 ENCOUNTER — Ambulatory Visit (HOSPITAL_BASED_OUTPATIENT_CLINIC_OR_DEPARTMENT_OTHER)
Admission: RE | Admit: 2017-02-27 | Discharge: 2017-02-27 | Disposition: A | Discharge status: Home / Self Care | Payer: Medicare Other | Source: Ambulatory Visit | Attending: Neurology | Admitting: Neurology

## 2017-02-27 DIAGNOSIS — I10 Essential (primary) hypertension: Secondary | ICD-10-CM | POA: Diagnosis not present

## 2017-02-27 DIAGNOSIS — I6529 Occlusion and stenosis of unspecified carotid artery: Secondary | ICD-10-CM | POA: Diagnosis not present

## 2017-02-27 DIAGNOSIS — G8110 Spastic hemiplegia affecting unspecified side: Secondary | ICD-10-CM | POA: Diagnosis not present

## 2017-02-27 DIAGNOSIS — Z1231 Encounter for screening mammogram for malignant neoplasm of breast: Secondary | ICD-10-CM

## 2017-02-27 DIAGNOSIS — E785 Hyperlipidemia, unspecified: Secondary | ICD-10-CM | POA: Diagnosis not present

## 2017-02-27 DIAGNOSIS — I70209 Unspecified atherosclerosis of native arteries of extremities, unspecified extremity: Secondary | ICD-10-CM | POA: Diagnosis not present

## 2017-02-27 DIAGNOSIS — R938 Abnormal findings on diagnostic imaging of other specified body structures: Secondary | ICD-10-CM | POA: Diagnosis not present

## 2017-02-27 DIAGNOSIS — Z87891 Personal history of nicotine dependence: Secondary | ICD-10-CM | POA: Diagnosis not present

## 2017-02-27 LAB — VAS US CAROTID
LCCAPDIAS: 24 cm/s
LEFT ECA DIAS: -16 cm/s
LEFT VERTEBRAL DIAS: 17 cm/s
LICAPDIAS: -19 cm/s
Left CCA dist dias: -19 cm/s
Left CCA dist sys: -56 cm/s
Left CCA prox sys: 99 cm/s
Left ICA dist dias: -24 cm/s
Left ICA dist sys: -79 cm/s
Left ICA prox sys: -53 cm/s
RCCADSYS: -99 cm/s
RCCAPDIAS: 15 cm/s
RIGHT ECA DIAS: -15 cm/s
RIGHT VERTEBRAL DIAS: 9 cm/s
Right CCA prox sys: 63 cm/s

## 2017-02-27 NOTE — Progress Notes (Signed)
*  PRELIMINARY RESULTS* Vascular Ultrasound Carotid Duplex has been completed.  Preliminary findings: Bilateral: No significant (1-39%) ICA stenosis. Antegrade vertebral flow.      Landry Mellow, RDMS, RVT  02/27/2017, 1:35 PM

## 2017-02-28 DIAGNOSIS — M47816 Spondylosis without myelopathy or radiculopathy, lumbar region: Secondary | ICD-10-CM | POA: Diagnosis not present

## 2017-02-28 DIAGNOSIS — E669 Obesity, unspecified: Secondary | ICD-10-CM | POA: Diagnosis not present

## 2017-02-28 DIAGNOSIS — I10 Essential (primary) hypertension: Secondary | ICD-10-CM | POA: Diagnosis not present

## 2017-02-28 DIAGNOSIS — E785 Hyperlipidemia, unspecified: Secondary | ICD-10-CM | POA: Diagnosis not present

## 2017-02-28 DIAGNOSIS — M48061 Spinal stenosis, lumbar region without neurogenic claudication: Secondary | ICD-10-CM | POA: Diagnosis not present

## 2017-02-28 DIAGNOSIS — I69354 Hemiplegia and hemiparesis following cerebral infarction affecting left non-dominant side: Secondary | ICD-10-CM | POA: Diagnosis not present

## 2017-03-03 ENCOUNTER — Telehealth: Payer: Self-pay

## 2017-03-03 NOTE — Telephone Encounter (Signed)
Rn call patients daughter Mary Le on dpr that carotid ultrasound was normal. Pt verbalized understanding.

## 2017-03-03 NOTE — Telephone Encounter (Signed)
-----   Message from Garvin Fila, MD sent at 02/27/2017  5:12 PM EDT ----- Mitchell Heir inform patient that carotid ultrasound study was normal

## 2017-03-03 NOTE — Telephone Encounter (Signed)
Left vm for patient to call back about carotid ultrasound test.

## 2017-03-03 NOTE — Telephone Encounter (Signed)
Patients daughter Mary Le (listed on [place]) called office returning RN's call.  Please call

## 2017-03-04 DIAGNOSIS — M48061 Spinal stenosis, lumbar region without neurogenic claudication: Secondary | ICD-10-CM | POA: Diagnosis not present

## 2017-03-04 DIAGNOSIS — I10 Essential (primary) hypertension: Secondary | ICD-10-CM | POA: Diagnosis not present

## 2017-03-04 DIAGNOSIS — E669 Obesity, unspecified: Secondary | ICD-10-CM | POA: Diagnosis not present

## 2017-03-04 DIAGNOSIS — M47816 Spondylosis without myelopathy or radiculopathy, lumbar region: Secondary | ICD-10-CM | POA: Diagnosis not present

## 2017-03-04 DIAGNOSIS — I69354 Hemiplegia and hemiparesis following cerebral infarction affecting left non-dominant side: Secondary | ICD-10-CM | POA: Diagnosis not present

## 2017-03-04 DIAGNOSIS — E785 Hyperlipidemia, unspecified: Secondary | ICD-10-CM | POA: Diagnosis not present

## 2017-03-05 LAB — CUP PACEART REMOTE DEVICE CHECK
Implantable Pulse Generator Implant Date: 20151117
MDC IDC SESS DTM: 20180804064511

## 2017-03-06 ENCOUNTER — Encounter: Payer: Self-pay | Admitting: Family Medicine

## 2017-03-07 DIAGNOSIS — E669 Obesity, unspecified: Secondary | ICD-10-CM | POA: Diagnosis not present

## 2017-03-07 DIAGNOSIS — M47816 Spondylosis without myelopathy or radiculopathy, lumbar region: Secondary | ICD-10-CM | POA: Diagnosis not present

## 2017-03-07 DIAGNOSIS — I10 Essential (primary) hypertension: Secondary | ICD-10-CM | POA: Diagnosis not present

## 2017-03-07 DIAGNOSIS — E785 Hyperlipidemia, unspecified: Secondary | ICD-10-CM | POA: Diagnosis not present

## 2017-03-07 DIAGNOSIS — M48061 Spinal stenosis, lumbar region without neurogenic claudication: Secondary | ICD-10-CM | POA: Diagnosis not present

## 2017-03-07 DIAGNOSIS — I69354 Hemiplegia and hemiparesis following cerebral infarction affecting left non-dominant side: Secondary | ICD-10-CM | POA: Diagnosis not present

## 2017-03-11 DIAGNOSIS — I69354 Hemiplegia and hemiparesis following cerebral infarction affecting left non-dominant side: Secondary | ICD-10-CM | POA: Diagnosis not present

## 2017-03-11 DIAGNOSIS — M48061 Spinal stenosis, lumbar region without neurogenic claudication: Secondary | ICD-10-CM | POA: Diagnosis not present

## 2017-03-11 DIAGNOSIS — E785 Hyperlipidemia, unspecified: Secondary | ICD-10-CM | POA: Diagnosis not present

## 2017-03-11 DIAGNOSIS — E669 Obesity, unspecified: Secondary | ICD-10-CM | POA: Diagnosis not present

## 2017-03-11 DIAGNOSIS — I10 Essential (primary) hypertension: Secondary | ICD-10-CM | POA: Diagnosis not present

## 2017-03-11 DIAGNOSIS — M47816 Spondylosis without myelopathy or radiculopathy, lumbar region: Secondary | ICD-10-CM | POA: Diagnosis not present

## 2017-03-12 ENCOUNTER — Ambulatory Visit
Admission: RE | Admit: 2017-03-12 | Discharge: 2017-03-12 | Disposition: A | Discharge status: Home / Self Care | Payer: Medicare Other | Source: Ambulatory Visit | Attending: Family Medicine | Admitting: Family Medicine

## 2017-03-12 DIAGNOSIS — Z1231 Encounter for screening mammogram for malignant neoplasm of breast: Secondary | ICD-10-CM

## 2017-03-12 DIAGNOSIS — M85851 Other specified disorders of bone density and structure, right thigh: Secondary | ICD-10-CM | POA: Diagnosis not present

## 2017-03-12 DIAGNOSIS — E2839 Other primary ovarian failure: Secondary | ICD-10-CM

## 2017-03-12 DIAGNOSIS — Z780 Asymptomatic menopausal state: Secondary | ICD-10-CM | POA: Diagnosis not present

## 2017-03-13 DIAGNOSIS — M47816 Spondylosis without myelopathy or radiculopathy, lumbar region: Secondary | ICD-10-CM | POA: Diagnosis not present

## 2017-03-13 DIAGNOSIS — I10 Essential (primary) hypertension: Secondary | ICD-10-CM | POA: Diagnosis not present

## 2017-03-13 DIAGNOSIS — M48061 Spinal stenosis, lumbar region without neurogenic claudication: Secondary | ICD-10-CM | POA: Diagnosis not present

## 2017-03-13 DIAGNOSIS — E669 Obesity, unspecified: Secondary | ICD-10-CM | POA: Diagnosis not present

## 2017-03-13 DIAGNOSIS — I69354 Hemiplegia and hemiparesis following cerebral infarction affecting left non-dominant side: Secondary | ICD-10-CM | POA: Diagnosis not present

## 2017-03-13 DIAGNOSIS — E785 Hyperlipidemia, unspecified: Secondary | ICD-10-CM | POA: Diagnosis not present

## 2017-03-18 DIAGNOSIS — E785 Hyperlipidemia, unspecified: Secondary | ICD-10-CM | POA: Diagnosis not present

## 2017-03-18 DIAGNOSIS — I10 Essential (primary) hypertension: Secondary | ICD-10-CM | POA: Diagnosis not present

## 2017-03-18 DIAGNOSIS — I69354 Hemiplegia and hemiparesis following cerebral infarction affecting left non-dominant side: Secondary | ICD-10-CM | POA: Diagnosis not present

## 2017-03-18 DIAGNOSIS — M47816 Spondylosis without myelopathy or radiculopathy, lumbar region: Secondary | ICD-10-CM | POA: Diagnosis not present

## 2017-03-18 DIAGNOSIS — M48061 Spinal stenosis, lumbar region without neurogenic claudication: Secondary | ICD-10-CM | POA: Diagnosis not present

## 2017-03-18 DIAGNOSIS — E669 Obesity, unspecified: Secondary | ICD-10-CM | POA: Diagnosis not present

## 2017-03-20 DIAGNOSIS — M47816 Spondylosis without myelopathy or radiculopathy, lumbar region: Secondary | ICD-10-CM | POA: Diagnosis not present

## 2017-03-20 DIAGNOSIS — M47817 Spondylosis without myelopathy or radiculopathy, lumbosacral region: Secondary | ICD-10-CM | POA: Diagnosis not present

## 2017-03-20 DIAGNOSIS — E669 Obesity, unspecified: Secondary | ICD-10-CM | POA: Diagnosis not present

## 2017-03-20 DIAGNOSIS — M48061 Spinal stenosis, lumbar region without neurogenic claudication: Secondary | ICD-10-CM | POA: Diagnosis not present

## 2017-03-20 DIAGNOSIS — M545 Low back pain: Secondary | ICD-10-CM | POA: Diagnosis not present

## 2017-03-20 DIAGNOSIS — E785 Hyperlipidemia, unspecified: Secondary | ICD-10-CM | POA: Diagnosis not present

## 2017-03-20 DIAGNOSIS — I10 Essential (primary) hypertension: Secondary | ICD-10-CM | POA: Diagnosis not present

## 2017-03-20 DIAGNOSIS — I69354 Hemiplegia and hemiparesis following cerebral infarction affecting left non-dominant side: Secondary | ICD-10-CM | POA: Diagnosis not present

## 2017-03-21 DIAGNOSIS — I10 Essential (primary) hypertension: Secondary | ICD-10-CM | POA: Diagnosis not present

## 2017-03-21 DIAGNOSIS — M48061 Spinal stenosis, lumbar region without neurogenic claudication: Secondary | ICD-10-CM | POA: Diagnosis not present

## 2017-03-21 DIAGNOSIS — E669 Obesity, unspecified: Secondary | ICD-10-CM | POA: Diagnosis not present

## 2017-03-21 DIAGNOSIS — E785 Hyperlipidemia, unspecified: Secondary | ICD-10-CM | POA: Diagnosis not present

## 2017-03-21 DIAGNOSIS — M47816 Spondylosis without myelopathy or radiculopathy, lumbar region: Secondary | ICD-10-CM | POA: Diagnosis not present

## 2017-03-21 DIAGNOSIS — I69354 Hemiplegia and hemiparesis following cerebral infarction affecting left non-dominant side: Secondary | ICD-10-CM | POA: Diagnosis not present

## 2017-03-25 ENCOUNTER — Ambulatory Visit (INDEPENDENT_AMBULATORY_CARE_PROVIDER_SITE_OTHER): Payer: Medicare Other | Admitting: *Deleted

## 2017-03-25 DIAGNOSIS — I63411 Cerebral infarction due to embolism of right middle cerebral artery: Secondary | ICD-10-CM | POA: Diagnosis not present

## 2017-03-26 DIAGNOSIS — I69354 Hemiplegia and hemiparesis following cerebral infarction affecting left non-dominant side: Secondary | ICD-10-CM | POA: Diagnosis not present

## 2017-03-26 DIAGNOSIS — E785 Hyperlipidemia, unspecified: Secondary | ICD-10-CM | POA: Diagnosis not present

## 2017-03-26 DIAGNOSIS — M47816 Spondylosis without myelopathy or radiculopathy, lumbar region: Secondary | ICD-10-CM | POA: Diagnosis not present

## 2017-03-26 DIAGNOSIS — E669 Obesity, unspecified: Secondary | ICD-10-CM | POA: Diagnosis not present

## 2017-03-26 DIAGNOSIS — M48061 Spinal stenosis, lumbar region without neurogenic claudication: Secondary | ICD-10-CM | POA: Diagnosis not present

## 2017-03-26 DIAGNOSIS — I10 Essential (primary) hypertension: Secondary | ICD-10-CM | POA: Diagnosis not present

## 2017-03-26 NOTE — Progress Notes (Signed)
[facility] Summary Report / Loop Recorder 

## 2017-03-28 DIAGNOSIS — E669 Obesity, unspecified: Secondary | ICD-10-CM | POA: Diagnosis not present

## 2017-03-28 DIAGNOSIS — E785 Hyperlipidemia, unspecified: Secondary | ICD-10-CM | POA: Diagnosis not present

## 2017-03-28 DIAGNOSIS — M47816 Spondylosis without myelopathy or radiculopathy, lumbar region: Secondary | ICD-10-CM | POA: Diagnosis not present

## 2017-03-28 DIAGNOSIS — I69354 Hemiplegia and hemiparesis following cerebral infarction affecting left non-dominant side: Secondary | ICD-10-CM | POA: Diagnosis not present

## 2017-03-28 DIAGNOSIS — I10 Essential (primary) hypertension: Secondary | ICD-10-CM | POA: Diagnosis not present

## 2017-03-28 DIAGNOSIS — M48061 Spinal stenosis, lumbar region without neurogenic claudication: Secondary | ICD-10-CM | POA: Diagnosis not present

## 2017-03-31 DIAGNOSIS — M47816 Spondylosis without myelopathy or radiculopathy, lumbar region: Secondary | ICD-10-CM | POA: Diagnosis not present

## 2017-03-31 DIAGNOSIS — E669 Obesity, unspecified: Secondary | ICD-10-CM | POA: Diagnosis not present

## 2017-03-31 DIAGNOSIS — I10 Essential (primary) hypertension: Secondary | ICD-10-CM | POA: Diagnosis not present

## 2017-03-31 DIAGNOSIS — I69354 Hemiplegia and hemiparesis following cerebral infarction affecting left non-dominant side: Secondary | ICD-10-CM | POA: Diagnosis not present

## 2017-03-31 DIAGNOSIS — E785 Hyperlipidemia, unspecified: Secondary | ICD-10-CM | POA: Diagnosis not present

## 2017-03-31 DIAGNOSIS — M48061 Spinal stenosis, lumbar region without neurogenic claudication: Secondary | ICD-10-CM | POA: Diagnosis not present

## 2017-03-31 LAB — CUP PACEART REMOTE DEVICE CHECK
Date Time Interrogation Session: 20180903101039
Implantable Pulse Generator Implant Date: 20151117

## 2017-04-02 DIAGNOSIS — E785 Hyperlipidemia, unspecified: Secondary | ICD-10-CM | POA: Diagnosis not present

## 2017-04-02 DIAGNOSIS — E669 Obesity, unspecified: Secondary | ICD-10-CM | POA: Diagnosis not present

## 2017-04-02 DIAGNOSIS — I10 Essential (primary) hypertension: Secondary | ICD-10-CM | POA: Diagnosis not present

## 2017-04-02 DIAGNOSIS — M48061 Spinal stenosis, lumbar region without neurogenic claudication: Secondary | ICD-10-CM | POA: Diagnosis not present

## 2017-04-02 DIAGNOSIS — M47816 Spondylosis without myelopathy or radiculopathy, lumbar region: Secondary | ICD-10-CM | POA: Diagnosis not present

## 2017-04-02 DIAGNOSIS — I69354 Hemiplegia and hemiparesis following cerebral infarction affecting left non-dominant side: Secondary | ICD-10-CM | POA: Diagnosis not present

## 2017-04-07 DIAGNOSIS — M47816 Spondylosis without myelopathy or radiculopathy, lumbar region: Secondary | ICD-10-CM | POA: Diagnosis not present

## 2017-04-07 DIAGNOSIS — I69354 Hemiplegia and hemiparesis following cerebral infarction affecting left non-dominant side: Secondary | ICD-10-CM | POA: Diagnosis not present

## 2017-04-07 DIAGNOSIS — E785 Hyperlipidemia, unspecified: Secondary | ICD-10-CM | POA: Diagnosis not present

## 2017-04-07 DIAGNOSIS — M48061 Spinal stenosis, lumbar region without neurogenic claudication: Secondary | ICD-10-CM | POA: Diagnosis not present

## 2017-04-07 DIAGNOSIS — I10 Essential (primary) hypertension: Secondary | ICD-10-CM | POA: Diagnosis not present

## 2017-04-07 DIAGNOSIS — E669 Obesity, unspecified: Secondary | ICD-10-CM | POA: Diagnosis not present

## 2017-04-09 DIAGNOSIS — M47816 Spondylosis without myelopathy or radiculopathy, lumbar region: Secondary | ICD-10-CM | POA: Diagnosis not present

## 2017-04-09 DIAGNOSIS — E785 Hyperlipidemia, unspecified: Secondary | ICD-10-CM | POA: Diagnosis not present

## 2017-04-09 DIAGNOSIS — I69354 Hemiplegia and hemiparesis following cerebral infarction affecting left non-dominant side: Secondary | ICD-10-CM | POA: Diagnosis not present

## 2017-04-09 DIAGNOSIS — E669 Obesity, unspecified: Secondary | ICD-10-CM | POA: Diagnosis not present

## 2017-04-09 DIAGNOSIS — M48061 Spinal stenosis, lumbar region without neurogenic claudication: Secondary | ICD-10-CM | POA: Diagnosis not present

## 2017-04-09 DIAGNOSIS — I10 Essential (primary) hypertension: Secondary | ICD-10-CM | POA: Diagnosis not present

## 2017-04-15 ENCOUNTER — Other Ambulatory Visit (HOSPITAL_COMMUNITY): Payer: Self-pay | Admitting: Interventional Radiology

## 2017-04-15 DIAGNOSIS — I10 Essential (primary) hypertension: Secondary | ICD-10-CM | POA: Diagnosis not present

## 2017-04-15 DIAGNOSIS — I69354 Hemiplegia and hemiparesis following cerebral infarction affecting left non-dominant side: Secondary | ICD-10-CM | POA: Diagnosis not present

## 2017-04-15 DIAGNOSIS — M48061 Spinal stenosis, lumbar region without neurogenic claudication: Secondary | ICD-10-CM | POA: Diagnosis not present

## 2017-04-15 DIAGNOSIS — I639 Cerebral infarction, unspecified: Secondary | ICD-10-CM

## 2017-04-15 DIAGNOSIS — E669 Obesity, unspecified: Secondary | ICD-10-CM | POA: Diagnosis not present

## 2017-04-15 DIAGNOSIS — E785 Hyperlipidemia, unspecified: Secondary | ICD-10-CM | POA: Diagnosis not present

## 2017-04-15 DIAGNOSIS — M47816 Spondylosis without myelopathy or radiculopathy, lumbar region: Secondary | ICD-10-CM | POA: Diagnosis not present

## 2017-04-18 DIAGNOSIS — E785 Hyperlipidemia, unspecified: Secondary | ICD-10-CM | POA: Diagnosis not present

## 2017-04-18 DIAGNOSIS — I10 Essential (primary) hypertension: Secondary | ICD-10-CM | POA: Diagnosis not present

## 2017-04-18 DIAGNOSIS — I69354 Hemiplegia and hemiparesis following cerebral infarction affecting left non-dominant side: Secondary | ICD-10-CM | POA: Diagnosis not present

## 2017-04-18 DIAGNOSIS — M47816 Spondylosis without myelopathy or radiculopathy, lumbar region: Secondary | ICD-10-CM | POA: Diagnosis not present

## 2017-04-18 DIAGNOSIS — E669 Obesity, unspecified: Secondary | ICD-10-CM | POA: Diagnosis not present

## 2017-04-18 DIAGNOSIS — M48061 Spinal stenosis, lumbar region without neurogenic claudication: Secondary | ICD-10-CM | POA: Diagnosis not present

## 2017-04-21 DIAGNOSIS — M47816 Spondylosis without myelopathy or radiculopathy, lumbar region: Secondary | ICD-10-CM | POA: Diagnosis not present

## 2017-04-21 DIAGNOSIS — I69354 Hemiplegia and hemiparesis following cerebral infarction affecting left non-dominant side: Secondary | ICD-10-CM | POA: Diagnosis not present

## 2017-04-21 DIAGNOSIS — M48061 Spinal stenosis, lumbar region without neurogenic claudication: Secondary | ICD-10-CM | POA: Diagnosis not present

## 2017-04-21 DIAGNOSIS — I10 Essential (primary) hypertension: Secondary | ICD-10-CM | POA: Diagnosis not present

## 2017-04-21 DIAGNOSIS — E669 Obesity, unspecified: Secondary | ICD-10-CM | POA: Diagnosis not present

## 2017-04-21 DIAGNOSIS — E785 Hyperlipidemia, unspecified: Secondary | ICD-10-CM | POA: Diagnosis not present

## 2017-04-23 ENCOUNTER — Ambulatory Visit (HOSPITAL_COMMUNITY): Payer: Medicare Other

## 2017-04-23 ENCOUNTER — Ambulatory Visit (INDEPENDENT_AMBULATORY_CARE_PROVIDER_SITE_OTHER): Payer: Medicare Other | Admitting: *Deleted

## 2017-04-23 ENCOUNTER — Ambulatory Visit (HOSPITAL_COMMUNITY)
Admission: RE | Admit: 2017-04-23 | Discharge: 2017-04-23 | Disposition: A | Discharge status: Home / Self Care | Payer: Medicare Other | Source: Ambulatory Visit | Attending: Interventional Radiology | Admitting: Interventional Radiology

## 2017-04-23 ENCOUNTER — Encounter (HOSPITAL_COMMUNITY): Payer: Self-pay

## 2017-04-23 DIAGNOSIS — I639 Cerebral infarction, unspecified: Secondary | ICD-10-CM

## 2017-04-23 DIAGNOSIS — I63411 Cerebral infarction due to embolism of right middle cerebral artery: Secondary | ICD-10-CM | POA: Diagnosis not present

## 2017-04-23 NOTE — Progress Notes (Signed)
[facility] Summary Report / Loop Recorder 

## 2017-04-25 LAB — CUP PACEART REMOTE DEVICE CHECK
Date Time Interrogation Session: 20181003104042
MDC IDC PG IMPLANT DT: 20151117

## 2017-04-28 DIAGNOSIS — M48061 Spinal stenosis, lumbar region without neurogenic claudication: Secondary | ICD-10-CM | POA: Diagnosis not present

## 2017-04-28 DIAGNOSIS — I69354 Hemiplegia and hemiparesis following cerebral infarction affecting left non-dominant side: Secondary | ICD-10-CM | POA: Diagnosis not present

## 2017-04-28 DIAGNOSIS — E785 Hyperlipidemia, unspecified: Secondary | ICD-10-CM | POA: Diagnosis not present

## 2017-04-28 DIAGNOSIS — E669 Obesity, unspecified: Secondary | ICD-10-CM | POA: Diagnosis not present

## 2017-04-28 DIAGNOSIS — I10 Essential (primary) hypertension: Secondary | ICD-10-CM | POA: Diagnosis not present

## 2017-04-28 DIAGNOSIS — M47816 Spondylosis without myelopathy or radiculopathy, lumbar region: Secondary | ICD-10-CM | POA: Diagnosis not present

## 2017-04-29 ENCOUNTER — Ambulatory Visit: Payer: Medicare Other

## 2017-04-30 DIAGNOSIS — E785 Hyperlipidemia, unspecified: Secondary | ICD-10-CM | POA: Diagnosis not present

## 2017-04-30 DIAGNOSIS — M48061 Spinal stenosis, lumbar region without neurogenic claudication: Secondary | ICD-10-CM | POA: Diagnosis not present

## 2017-04-30 DIAGNOSIS — I10 Essential (primary) hypertension: Secondary | ICD-10-CM | POA: Diagnosis not present

## 2017-04-30 DIAGNOSIS — E669 Obesity, unspecified: Secondary | ICD-10-CM | POA: Diagnosis not present

## 2017-04-30 DIAGNOSIS — M47816 Spondylosis without myelopathy or radiculopathy, lumbar region: Secondary | ICD-10-CM | POA: Diagnosis not present

## 2017-04-30 DIAGNOSIS — I69354 Hemiplegia and hemiparesis following cerebral infarction affecting left non-dominant side: Secondary | ICD-10-CM | POA: Diagnosis not present

## 2017-05-01 DIAGNOSIS — M47817 Spondylosis without myelopathy or radiculopathy, lumbosacral region: Secondary | ICD-10-CM | POA: Diagnosis not present

## 2017-05-06 DIAGNOSIS — I10 Essential (primary) hypertension: Secondary | ICD-10-CM | POA: Diagnosis not present

## 2017-05-06 DIAGNOSIS — E785 Hyperlipidemia, unspecified: Secondary | ICD-10-CM | POA: Diagnosis not present

## 2017-05-06 DIAGNOSIS — M47816 Spondylosis without myelopathy or radiculopathy, lumbar region: Secondary | ICD-10-CM | POA: Diagnosis not present

## 2017-05-06 DIAGNOSIS — M48061 Spinal stenosis, lumbar region without neurogenic claudication: Secondary | ICD-10-CM | POA: Diagnosis not present

## 2017-05-06 DIAGNOSIS — I69354 Hemiplegia and hemiparesis following cerebral infarction affecting left non-dominant side: Secondary | ICD-10-CM | POA: Diagnosis not present

## 2017-05-06 DIAGNOSIS — E669 Obesity, unspecified: Secondary | ICD-10-CM | POA: Diagnosis not present

## 2017-05-09 DIAGNOSIS — M47816 Spondylosis without myelopathy or radiculopathy, lumbar region: Secondary | ICD-10-CM | POA: Diagnosis not present

## 2017-05-09 DIAGNOSIS — E669 Obesity, unspecified: Secondary | ICD-10-CM | POA: Diagnosis not present

## 2017-05-09 DIAGNOSIS — I69354 Hemiplegia and hemiparesis following cerebral infarction affecting left non-dominant side: Secondary | ICD-10-CM | POA: Diagnosis not present

## 2017-05-09 DIAGNOSIS — M48061 Spinal stenosis, lumbar region without neurogenic claudication: Secondary | ICD-10-CM | POA: Diagnosis not present

## 2017-05-09 DIAGNOSIS — E785 Hyperlipidemia, unspecified: Secondary | ICD-10-CM | POA: Diagnosis not present

## 2017-05-09 DIAGNOSIS — I10 Essential (primary) hypertension: Secondary | ICD-10-CM | POA: Diagnosis not present

## 2017-05-14 DIAGNOSIS — I10 Essential (primary) hypertension: Secondary | ICD-10-CM | POA: Diagnosis not present

## 2017-05-14 DIAGNOSIS — M48061 Spinal stenosis, lumbar region without neurogenic claudication: Secondary | ICD-10-CM | POA: Diagnosis not present

## 2017-05-14 DIAGNOSIS — M47816 Spondylosis without myelopathy or radiculopathy, lumbar region: Secondary | ICD-10-CM | POA: Diagnosis not present

## 2017-05-14 DIAGNOSIS — E785 Hyperlipidemia, unspecified: Secondary | ICD-10-CM | POA: Diagnosis not present

## 2017-05-14 DIAGNOSIS — E669 Obesity, unspecified: Secondary | ICD-10-CM | POA: Diagnosis not present

## 2017-05-14 DIAGNOSIS — I69354 Hemiplegia and hemiparesis following cerebral infarction affecting left non-dominant side: Secondary | ICD-10-CM | POA: Diagnosis not present

## 2017-05-16 DIAGNOSIS — I10 Essential (primary) hypertension: Secondary | ICD-10-CM | POA: Diagnosis not present

## 2017-05-16 DIAGNOSIS — E785 Hyperlipidemia, unspecified: Secondary | ICD-10-CM | POA: Diagnosis not present

## 2017-05-16 DIAGNOSIS — M48061 Spinal stenosis, lumbar region without neurogenic claudication: Secondary | ICD-10-CM | POA: Diagnosis not present

## 2017-05-16 DIAGNOSIS — E669 Obesity, unspecified: Secondary | ICD-10-CM | POA: Diagnosis not present

## 2017-05-16 DIAGNOSIS — I69354 Hemiplegia and hemiparesis following cerebral infarction affecting left non-dominant side: Secondary | ICD-10-CM | POA: Diagnosis not present

## 2017-05-16 DIAGNOSIS — M47816 Spondylosis without myelopathy or radiculopathy, lumbar region: Secondary | ICD-10-CM | POA: Diagnosis not present

## 2017-05-23 ENCOUNTER — Ambulatory Visit (INDEPENDENT_AMBULATORY_CARE_PROVIDER_SITE_OTHER): Payer: Medicare Other | Admitting: *Deleted

## 2017-05-23 DIAGNOSIS — I63411 Cerebral infarction due to embolism of right middle cerebral artery: Secondary | ICD-10-CM

## 2017-05-23 NOTE — Progress Notes (Signed)
[facility] Summary Report / Loop Recorder 

## 2017-05-26 LAB — CUP PACEART REMOTE DEVICE CHECK
Date Time Interrogation Session: 20181102121529
Implantable Pulse Generator Implant Date: 20151117

## 2017-06-02 ENCOUNTER — Ambulatory Visit (HOSPITAL_COMMUNITY): Admission: RE | Admit: 2017-06-02 | Payer: Medicare Other | Source: Ambulatory Visit

## 2017-06-02 ENCOUNTER — Encounter (HOSPITAL_COMMUNITY): Payer: Self-pay

## 2017-06-02 ENCOUNTER — Ambulatory Visit (HOSPITAL_COMMUNITY)
Admission: RE | Admit: 2017-06-02 | Discharge: 2017-06-02 | Disposition: A | Discharge status: Home / Self Care | Payer: Medicare Other | Source: Ambulatory Visit | Attending: Interventional Radiology | Admitting: Interventional Radiology

## 2017-06-02 DIAGNOSIS — Z8673 Personal history of transient ischemic attack (TIA), and cerebral infarction without residual deficits: Secondary | ICD-10-CM | POA: Diagnosis not present

## 2017-06-02 DIAGNOSIS — I7789 Other specified disorders of arteries and arterioles: Secondary | ICD-10-CM | POA: Diagnosis not present

## 2017-06-02 DIAGNOSIS — I639 Cerebral infarction, unspecified: Secondary | ICD-10-CM | POA: Diagnosis present

## 2017-06-02 DIAGNOSIS — I63543 Cerebral infarction due to unspecified occlusion or stenosis of bilateral cerebellar arteries: Secondary | ICD-10-CM | POA: Insufficient documentation

## 2017-06-02 LAB — POCT I-STAT CREATININE: Creatinine, Ser: 0.9 mg/dL (ref 0.44–1.00)

## 2017-06-02 MED ORDER — IOPAMIDOL (ISOVUE-370) INJECTION 76%
INTRAVENOUS | Status: AC
  Administered 2017-06-02: 50 mL
  Filled 2017-06-02: qty 50

## 2017-06-04 ENCOUNTER — Other Ambulatory Visit (HOSPITAL_COMMUNITY): Payer: Self-pay | Admitting: Interventional Radiology

## 2017-06-04 DIAGNOSIS — I639 Cerebral infarction, unspecified: Secondary | ICD-10-CM

## 2017-06-09 ENCOUNTER — Other Ambulatory Visit (HOSPITAL_COMMUNITY): Payer: Self-pay | Admitting: Interventional Radiology

## 2017-06-09 ENCOUNTER — Ambulatory Visit (HOSPITAL_COMMUNITY): Payer: Medicare Other

## 2017-06-09 ENCOUNTER — Ambulatory Visit (HOSPITAL_COMMUNITY): Admission: RE | Admit: 2017-06-09 | Payer: Medicare Other | Source: Ambulatory Visit

## 2017-06-09 DIAGNOSIS — I639 Cerebral infarction, unspecified: Secondary | ICD-10-CM

## 2017-06-10 ENCOUNTER — Emergency Department (HOSPITAL_COMMUNITY)
Admission: EM | Admit: 2017-06-10 | Discharge: 2017-06-11 | Disposition: A | Discharge status: Home / Self Care | Payer: Medicare Other | Attending: Emergency Medicine | Admitting: Emergency Medicine

## 2017-06-10 ENCOUNTER — Other Ambulatory Visit: Payer: Self-pay

## 2017-06-10 DIAGNOSIS — G43109 Migraine with aura, not intractable, without status migrainosus: Secondary | ICD-10-CM

## 2017-06-10 DIAGNOSIS — Z8673 Personal history of transient ischemic attack (TIA), and cerebral infarction without residual deficits: Secondary | ICD-10-CM | POA: Diagnosis not present

## 2017-06-10 DIAGNOSIS — Z79899 Other long term (current) drug therapy: Secondary | ICD-10-CM | POA: Diagnosis not present

## 2017-06-10 DIAGNOSIS — R51 Headache: Secondary | ICD-10-CM | POA: Diagnosis present

## 2017-06-10 DIAGNOSIS — E785 Hyperlipidemia, unspecified: Secondary | ICD-10-CM | POA: Insufficient documentation

## 2017-06-10 DIAGNOSIS — Z87891 Personal history of nicotine dependence: Secondary | ICD-10-CM | POA: Insufficient documentation

## 2017-06-10 DIAGNOSIS — Z7982 Long term (current) use of aspirin: Secondary | ICD-10-CM | POA: Diagnosis not present

## 2017-06-10 DIAGNOSIS — I10 Essential (primary) hypertension: Secondary | ICD-10-CM | POA: Diagnosis not present

## 2017-06-10 DIAGNOSIS — G4489 Other headache syndrome: Secondary | ICD-10-CM | POA: Diagnosis not present

## 2017-06-10 DIAGNOSIS — G43909 Migraine, unspecified, not intractable, without status migrainosus: Secondary | ICD-10-CM | POA: Diagnosis not present

## 2017-06-10 MED ORDER — DIPHENHYDRAMINE HCL 50 MG/ML IJ SOLN
25.0000 mg | Freq: Once | INTRAMUSCULAR | Status: AC
  Administered 2017-06-11: 25 mg via INTRAVENOUS
  Filled 2017-06-10: qty 1

## 2017-06-10 MED ORDER — SODIUM CHLORIDE 0.9 % IV BOLUS (SEPSIS)
1000.0000 mL | Freq: Once | INTRAVENOUS | Status: AC
  Administered 2017-06-11: 1000 mL via INTRAVENOUS

## 2017-06-10 MED ORDER — METOCLOPRAMIDE HCL 5 MG/ML IJ SOLN
10.0000 mg | Freq: Once | INTRAMUSCULAR | Status: AC
  Administered 2017-06-11: 10 mg via INTRAVENOUS
  Filled 2017-06-10: qty 2

## 2017-06-10 NOTE — ED Triage Notes (Signed)
Per EMS, pt from home with migraine starting yesterday with no relief from tylenol. Hx of stroke in 2015 leaving left sided paralysis. No new neurological deficits.

## 2017-06-10 NOTE — ED Notes (Signed)
Bed: CX44 Expected date:  Expected time:  Means of arrival:  Comments: migraines

## 2017-06-10 NOTE — ED Provider Notes (Signed)
TIME SEEN: 11:41 PM  CHIEF COMPLAINT: Migraine  HPI: Patient is a 71 year old female with history of hypertension, hyperlipidemia, previous stroke in 2015 with residual left-sided weakness, migraine headaches who presents to the emergency department with intermittent frontal throbbing headache that she describes as typical of her migraines that started yesterday.  She has had "halos".  He tried 2 Tylenol at home without relief.  No new numbness, tingling or focal weakness.  No head injury.  No fever, neck pain or neck stiffness.  No chest pain or shortness of breath.  ROS: See HPI Constitutional: no fever  Eyes: no drainage  ENT: no runny nose   Cardiovascular:  no chest pain  Resp: no SOB  GI: no vomiting GU: no dysuria Integumentary: no rash  Allergy: no hives  Musculoskeletal: no leg swelling  Neurological: no slurred speech ROS otherwise negative  PAST MEDICAL HISTORY/PAST SURGICAL HISTORY:  Past Medical History:  Diagnosis Date  . Back pain    arthritis  . CVA (cerebral infarction)   . Fall 11/2014   fell in bathroom, uses a cane  . Hypercholesteremia   . Hypertension   . Joint pain    back, history of   . Stroke ([place])   . Weakness     MEDICATIONS:  Prior to Admission medications   Medication Sig Start Date End Date Taking? Authorizing Provider  acetaminophen (TYLENOL) 325 MG tablet Take 650 mg by mouth every 6 (six) hours as needed (pain).     [provider]  aspirin 81 MG tablet Take 81 mg by mouth daily.    [provider]  famotidine (PEPCID) 10 MG tablet Take 1 tablet (10 mg total) by mouth 2 (two) times daily. Patient taking differently: Take 10 mg by mouth daily as needed.  06/23/14   Love, [name], PA-C  lisinopril (PRINIVIL,ZESTRIL) 20 MG tablet Take by mouth.    [provider]  loratadine (ALAVERT) 10 MG tablet Take 10 mg by mouth daily as needed for allergies.    [provider]  metoprolol succinate (TOPROL-XL) 25 MG  24 hr tablet Take 0.5 tablets (12.5 mg total) by mouth daily. 12/05/16   [name], MD  Multiple Vitamin (MULTIVITAMIN WITH MINERALS) TABS tablet Take 1 tablet by mouth daily. Reported on 11/22/2015    [provider]  Polyethyl Glycol-Propyl Glycol (SYSTANE OP) Place 1 drop into both eyes daily as needed (dry eyeys).    [provider]  senna-docusate (SENOKOT-S) 8.6-50 MG per tablet Take 3 tablets by mouth 2 (two) times daily. Patient taking differently: Take 2 tablets by mouth 2 (two) times daily.  06/23/14   Bary Leriche, PA-C    ALLERGIES:  Allergies  Allergen Reactions  . Tizanidine Itching  . Aspirin Nausea And Vomiting    Tolerated baby aspirin, but with more than once per day of full strength for aches and pains - had stomach upset.  No history of PUD/gastric bleeding known.   . Statins Other (See Comments)    Myalgias and chest pain (has tried Lipitor and Pravachol)    SOCIAL HISTORY:  Social History   Tobacco Use  . Smoking status: Former Smoker    Years: 5.00  . Smokeless tobacco: Never Used  Substance Use Topics  . Alcohol use: Yes    Alcohol/week: 0.6 oz    Types: 1 Glasses of wine per week    Comment: occasionally wine    FAMILY HISTORY: Family History  Problem Relation Age of  Onset  . Coronary artery disease Mother 39       MI  . Kidney disease Father   . Breast cancer Neg Hx     EXAM: BP 137/75 (BP Location: Right Arm)   Pulse 83   Temp 98 F (36.7 C) (Oral)   Resp 20   SpO2 98%  CONSTITUTIONAL: Alert and oriented and responds appropriately to questions. Well-appearing; well-nourished HEAD: Normocephalic EYES: Conjunctivae clear, pupils appear equal, EOMI, positive for photophobia ENT: normal nose; moist mucous membranes NECK: Supple, no meningismus, no nuchal rigidity, no LAD  CARD: RRR; S1 and S2 appreciated; no murmurs, no clicks, no rubs, no gallops RESP: Normal chest excursion without splinting or tachypnea; breath  sounds clear and equal bilaterally; no wheezes, no rhonchi, no rales, no hypoxia or respiratory distress, speaking full sentences ABD/GI: Normal bowel sounds; non-distended; soft, non-tender, no rebound, no guarding, no peritoneal signs, no hepatosplenomegaly BACK:  The back appears normal and is non-tender to palpation, there is no CVA tenderness EXT: Normal ROM in all joints; non-tender to palpation; no edema; normal capillary refill; no cyanosis, no calf tenderness or swelling    SKIN: Normal color for age and race; warm; no rash NEURO: Patient has flaccid paralysis of the left upper extremity which is chronic, decreased strength in the left lower extremity compared to the right which is also chronic, normal movement of the right upper and lower extremities, cranial nerves II through XII intact, normal speech, normal sensation diffusely PSYCH: The patient's mood and manner are appropriate. Grooming and personal hygiene are appropriate.  MEDICAL DECISION MAKING: Patient here complaining of her typical migraine headache.  No new focal neurologic deficits.  No fever or meningismus.  No head injury.  I do not feel she needs emergent head imaging at this time.  Doubt intracranial hemorrhage, stroke, meningitis, encephalitis, cavernous sinus thrombosis.  We will treat symptomatically with IV fluids, Reglan and Benadryl.  ED PROGRESS: 2:15 AM  Pt's headache completely resolved after migraine cocktail.  I feel she is safe to be discharged home.  Will give outpatient neurology follow-up given she reports headaches were improving but have started to increase gradually in frequency.  Discussed return precautions.  She has a PCP for follow-up.   At this time, I do not feel there is any life-threatening condition present. I have reviewed and discussed all results (EKG, imaging, lab, urine as appropriate) and exam findings with patient/family. I have reviewed nursing notes and appropriate previous records.  I feel  the patient is safe to be discharged home without further emergent workup and can continue workup as an outpatient as needed. Discussed usual and customary return precautions. Patient/family verbalize understanding and are comfortable with this plan.  Outpatient follow-up has been provided if needed. All questions have been answered.      Jinny Sweetland, Delice Bison, DO 06/11/17 [phone]

## 2017-06-11 DIAGNOSIS — G43109 Migraine with aura, not intractable, without status migrainosus: Secondary | ICD-10-CM | POA: Diagnosis not present

## 2017-06-15 ENCOUNTER — Other Ambulatory Visit: Payer: Self-pay | Admitting: Family Medicine

## 2017-06-15 DIAGNOSIS — I10 Essential (primary) hypertension: Secondary | ICD-10-CM

## 2017-06-18 ENCOUNTER — Ambulatory Visit (HOSPITAL_COMMUNITY)
Admission: RE | Admit: 2017-06-18 | Discharge: 2017-06-18 | Disposition: A | Discharge status: Home / Self Care | Payer: Medicare Other | Source: Ambulatory Visit | Attending: Interventional Radiology | Admitting: Interventional Radiology

## 2017-06-18 ENCOUNTER — Encounter (HOSPITAL_COMMUNITY): Payer: Self-pay | Admitting: Radiology

## 2017-06-18 ENCOUNTER — Other Ambulatory Visit (HOSPITAL_COMMUNITY): Payer: Self-pay | Admitting: Interventional Radiology

## 2017-06-18 DIAGNOSIS — I639 Cerebral infarction, unspecified: Secondary | ICD-10-CM

## 2017-06-18 DIAGNOSIS — I872 Venous insufficiency (chronic) (peripheral): Secondary | ICD-10-CM | POA: Diagnosis not present

## 2017-06-18 DIAGNOSIS — I878 Other specified disorders of veins: Secondary | ICD-10-CM | POA: Diagnosis not present

## 2017-06-18 HISTORY — PX: IR US GUIDE VASC ACCESS RIGHT: IMG2390

## 2017-06-18 HISTORY — PX: IR RADIOLOGY PERIPHERAL GUIDED IV START: IMG5598

## 2017-06-18 MED ORDER — LIDOCAINE HCL 1 % IJ SOLN
INTRAMUSCULAR | Status: DC | PRN
  Administered 2017-06-18: 5 mL

## 2017-06-18 MED ORDER — IOPAMIDOL (ISOVUE-370) INJECTION 76%
INTRAVENOUS | Status: AC
  Filled 2017-06-18: qty 50

## 2017-06-18 MED ORDER — LIDOCAINE HCL 1 % IJ SOLN
INTRAMUSCULAR | Status: AC
  Filled 2017-06-18: qty 20

## 2017-06-18 MED ORDER — IOPAMIDOL (ISOVUE-370) INJECTION 76%
50.0000 mL | Freq: Once | INTRAVENOUS | Status: AC | PRN
  Administered 2017-06-18: 50 mL via INTRAVENOUS

## 2017-06-18 NOTE — Procedures (Signed)
Successful US guided (R)basilic vein micropuncture catheter placement Ready for use. No complications.  Ascencion Dike PA-C Interventional Radiology 06/18/2017 2:55 PM

## 2017-06-20 ENCOUNTER — Telehealth (HOSPITAL_COMMUNITY): Payer: Self-pay

## 2017-06-20 NOTE — Telephone Encounter (Signed)
Daughter agreed to have pt f/u in 1 yr with cta head and neck per Dr. Estanislado Pandy. AW

## 2017-06-23 ENCOUNTER — Ambulatory Visit (INDEPENDENT_AMBULATORY_CARE_PROVIDER_SITE_OTHER): Payer: Medicare Other | Admitting: *Deleted

## 2017-06-23 DIAGNOSIS — I63411 Cerebral infarction due to embolism of right middle cerebral artery: Secondary | ICD-10-CM | POA: Diagnosis not present

## 2017-06-23 NOTE — Progress Notes (Signed)
[facility] Summary Report / Loop Recorder 

## 2017-07-01 LAB — CUP PACEART REMOTE DEVICE CHECK
Implantable Pulse Generator Implant Date: 20151117
MDC IDC SESS DTM: 20181202133938

## 2017-07-07 ENCOUNTER — Ambulatory Visit: Payer: Medicare Other | Admitting: Neurology

## 2017-07-09 ENCOUNTER — Encounter: Payer: Self-pay | Admitting: Neurology

## 2017-07-17 ENCOUNTER — Ambulatory Visit: Payer: Medicare Other | Admitting: Family Medicine

## 2017-07-23 ENCOUNTER — Ambulatory Visit (INDEPENDENT_AMBULATORY_CARE_PROVIDER_SITE_OTHER): Payer: Medicare Other | Admitting: *Deleted

## 2017-07-23 DIAGNOSIS — I63411 Cerebral infarction due to embolism of right middle cerebral artery: Secondary | ICD-10-CM | POA: Diagnosis not present

## 2017-07-24 NOTE — Progress Notes (Signed)
[facility] Summary Report / Loop Recorder 

## 2017-07-28 ENCOUNTER — Telehealth: Payer: Self-pay | Admitting: *Deleted

## 2017-07-28 NOTE — Telephone Encounter (Signed)
Spoke with patient regarding LINQ explant. Patient refused explant at this time. Advised patient a return kit will be sent to her home for the [facility] Monitor. Patient verbalized understanding.

## 2017-07-28 NOTE — Telephone Encounter (Signed)
Spoke with daughter [name]) regarding LINQ at [facility] since 07/27/2017. Discussed the next step with daughter. Patient's daughter requested [place] to call patient at [phone] to discuss explant with patient and return call if patient requests appointment.

## 2017-08-05 ENCOUNTER — Ambulatory Visit: Payer: Medicare Other | Admitting: Family Medicine

## 2017-08-05 LAB — CUP PACEART REMOTE DEVICE CHECK
Date Time Interrogation Session: 20190101133647
MDC IDC PG IMPLANT DT: 20151117

## 2017-08-11 ENCOUNTER — Ambulatory Visit: Payer: Medicare Other | Admitting: Family Medicine

## 2017-08-13 ENCOUNTER — Other Ambulatory Visit: Payer: Self-pay | Admitting: Internal Medicine

## 2017-08-19 ENCOUNTER — Ambulatory Visit: Payer: Medicare Other

## 2017-08-19 ENCOUNTER — Ambulatory Visit (INDEPENDENT_AMBULATORY_CARE_PROVIDER_SITE_OTHER): Payer: Medicare Other | Admitting: Family Medicine

## 2017-08-19 ENCOUNTER — Encounter: Payer: Self-pay | Admitting: Family Medicine

## 2017-08-19 VITALS — BP 126/76 | HR 81 | Temp 98.2°F | Resp 17

## 2017-08-19 DIAGNOSIS — E785 Hyperlipidemia, unspecified: Secondary | ICD-10-CM

## 2017-08-19 DIAGNOSIS — G894 Chronic pain syndrome: Secondary | ICD-10-CM | POA: Diagnosis not present

## 2017-08-19 DIAGNOSIS — K5900 Constipation, unspecified: Secondary | ICD-10-CM | POA: Diagnosis not present

## 2017-08-19 DIAGNOSIS — M549 Dorsalgia, unspecified: Secondary | ICD-10-CM

## 2017-08-19 DIAGNOSIS — R7303 Prediabetes: Secondary | ICD-10-CM | POA: Diagnosis not present

## 2017-08-19 DIAGNOSIS — I63411 Cerebral infarction due to embolism of right middle cerebral artery: Secondary | ICD-10-CM

## 2017-08-19 DIAGNOSIS — G8929 Other chronic pain: Secondary | ICD-10-CM

## 2017-08-19 DIAGNOSIS — I10 Essential (primary) hypertension: Secondary | ICD-10-CM | POA: Diagnosis not present

## 2017-08-19 DIAGNOSIS — R1084 Generalized abdominal pain: Secondary | ICD-10-CM

## 2017-08-19 MED ORDER — METOPROLOL SUCCINATE ER 25 MG PO TB24: ORAL_TABLET | ORAL | 1 refills | Status: DC

## 2017-08-19 NOTE — Patient Instructions (Addendum)
I would recommend meeting with ortho to decide if another injection is needed or other treatments. Also call [facility] pain [facility] [phone]) to schedule follow up appointment. Let me know if other practice referral needed.   For depression symptoms, I would recommend meeting with therapist and possible medication. Ok to try counseling first, but follow up to discuss symptoms further next few weeks.  Mary Le : [phone]   For constipation, I will refer you to gastroenterology as requested. Ok to take miralax daily for now. If fever, vomiting, or worsening pain - please return here or ER right away.   I will check cholesterol again, then may need to meet with lipid specialist to try to get Repatha covered. Goal LDL is under 70.    Constipation, Adult Constipation is when a person has fewer bowel movements in a week than normal, has difficulty having a bowel movement, or has stools that are dry, hard, or larger than normal. Constipation may be caused by an underlying condition. It may become worse with age if a person takes certain medicines and does not take in enough fluids. Follow these instructions at home: Eating and drinking   Eat foods that have a lot of fiber, such as fresh fruits and vegetables, whole grains, and beans.  Limit foods that are high in fat, low in fiber, or overly processed, such as french fries, hamburgers, cookies, candies, and soda.  Drink enough fluid to keep your urine clear or pale yellow. General instructions  Exercise regularly or as told by your health care provider.  Go to the restroom when you have the urge to go. Do not hold it in.  Take over-the-counter and prescription medicines only as told by your health care provider. These include any fiber supplements.  Practice pelvic floor retraining exercises, such as deep breathing while relaxing the lower abdomen and pelvic floor relaxation during bowel movements.  Watch your condition for  any changes.  Keep all follow-up visits as told by your health care provider. This is important. Contact a health care provider if:  You have pain that gets worse.  You have a fever.  You do not have a bowel movement after 4 days.  You vomit.  You are not hungry.  You lose weight.  You are bleeding from the anus.  You have thin, pencil-like stools. Get help right away if:  You have a fever and your symptoms suddenly get worse.  You leak stool or have blood in your stool.  Your abdomen is bloated.  You have severe pain in your abdomen.  You feel dizzy or you faint. This information is not intended to replace advice given to you by your health care provider. Make sure you discuss any questions you have with your health care provider. Document Released: 04/05/2004 Document Revised: 01/26/2016 Document Reviewed: 12/27/2015 Elsevier Interactive Patient Education  2018 [employer].    IF you received an x-ray today, you will receive an invoice from [facility]. Please contact [facility] at [phone] with questions or concerns regarding your invoice.   IF you received labwork today, you will receive an invoice from [facility]. Please contact [facility] at [phone] with questions or concerns regarding your invoice.   Our billing staff will not be able to assist you with questions regarding bills from these companies.  You will be contacted with the lab results as soon as they are available. The fastest way to get your results is to activate your My Chart account.  Instructions are located on the last page of this paperwork. If you have not heard from [place] regarding the results in 2 weeks, please contact this office.

## 2017-08-19 NOTE — Progress Notes (Signed)
Subjective:  This chart was scribed for [name], MD by Tamsen Roers, at [place] at [facility].  This patient was seen in room 9 and the patient's care was started at 11:13 AM.   Chief Complaint  Patient presents with  . Medication Refill    lisinopril     Patient ID: Mary Le, female    DOB: Oct 03, 1945, 72 y.o.   MRN: [id number]  HPI HPI Comments: Mary Le is a 72 y.o. female who presents to Primary Care at [facility] for follow up.  She has a history of multiple medical problems including hypertension, hyperlipidemia, CVA of right MCA with resultant left sided hemi paresis, followed by neurology.   Patient feels depressed as her recovery is taking much longer than she assumed. She has been feeling this way since the summer time and is now thinking more "realistically" about her recovery/ getting her strength back.  Patient does not think that she will gain her strength back and this is what upsets her most. She enjoys talking to people but rarely goes out as she is in too much pain (in her back and legs) to go outdoors.  Patient denies any thoughts of suicide or homicide. She was also previously treated by Dr. Letta Pate (physical medicine and rehab)but per daughter, she had called him and was not able to reach him .  Patient had received previous Botox injections for the spastic hemiplegia.   Constipation: Patient is complaining of worsening constipation (hard stool) and only has bowel movements once per week or every four days.  She describes that pain along with the constipation as "sharp razor blades" and has nausea/sweating/abdominal pain when this occurs.  Patient uses Miralax (2-3 times per week).    Hyperlipidemia: Intolerant of statin previously. Had discussed repatha previously.    Hypertension: Takes Metoprolol Succinate 25 mg 1/2 tablet twice per day,  normal creatine in November at ER visit for headache.--- Patient is compliant with this medication and  denies any side effects.  Patient has not seen a cardiologist recently.  Lab Results  Component Value Date   CREATININE 0.90 06/02/2017   Chronic venous insufficiency:  followed by Dr. Scot Dock in August. Recommended intermittent leg elevation and activity, no other interventions recommended with PRN follow up. ABI 63%.with history of CVA.  --- She has been elevating her legs.   Neuro: follow by Dr. Leonie Man,  appointment in August of last year. Continued on aspirin, stopped Plavix. BP goal less than 130/90.  LDL goal less than 70. Plan for one year follow up.   Lower back pain:  Seen by [facility]- Dr. Layne Benton (last October) treated with Lidoderm patch for low back facet pain spasm and post herpetic neuralgia. Has also had facet injection by Dr. Ron Agee.  --- She has not been using the Lidoderm patches (never received them) and states that the fact injection really helped her "for about three weeks".    Diabetes screening: previously elevated and pre-diabetes level Lab Results  Component Value Date   HGBA1C 5.5 12/05/2016    Patient Active Problem List   Diagnosis Date Noted  . HTN (hypertension) 07/26/2015  . Obesity (BMI 30.0-34.9) 07/26/2015  . Syncope 12/06/2014  . Spastic hemiplegia affecting nondominant side ([place]) 07/25/2014  . 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 Left hemiparesis ([place]) 06/10/2014  . Left-sided neglect 06/10/2014  . Thrombotic stroke involving right middle cerebral artery ([place]) 06/08/2014  . Acute respiratory failure  with hypoxia ([place]) 06/05/2014  . Cerebral infarction due to occlusion of right middle cerebral artery ([place]) 06/03/2014  . Cerebral infarct ([place]) 06/03/2014  . Altered mental status 06/03/2014  . GERD (gastroesophageal reflux disease) 04/12/2014  . Dyslipidemia- statin intol 02/07/2014  . Chest pain- Low risk Myoview July 2015 02/06/2014   Past Medical History:  Diagnosis Date  . Back pain      arthritis  . CVA (cerebral infarction)   . Fall 11/2014   fell in bathroom, uses a cane  . Hypercholesteremia   . Hypertension   . Joint pain    back, history of   . Stroke ([place])   . Weakness    Past Surgical History:  Procedure Laterality Date  . APPENDECTOMY    . HEMORROIDECTOMY    . IR RADIOLOGY PERIPHERAL GUIDED IV START  06/18/2017  . IR US GUIDE VASC ACCESS RIGHT  06/18/2017  . LOOP RECORDER IMPLANT  06/07/2014   MDT LINQ implanted by Dr [name] for cryptogenic stroke  . LOOP RECORDER IMPLANT N/A 06/07/2014   Procedure: LOOP RECORDER IMPLANT;  Surgeon: [name], MD;  Location: [facility] CATH LAB;  Service: Cardiovascular;  Laterality: N/A;  . RADIOLOGY WITH ANESTHESIA N/A 06/03/2014   Procedure: RADIOLOGY WITH ANESTHESIA;  Surgeon: [name], MD;  Location: [facility];  Service: Radiology;  Laterality: N/A;  . TEE WITHOUT CARDIOVERSION N/A 06/07/2014   Procedure: TRANSESOPHAGEAL ECHOCARDIOGRAM (TEE);  Surgeon: [name], MD;  [facility] ENDOSCOPY;  Service: Cardiovascular;  Laterality: N/A;  . TONSILLECTOMY    . TUBAL LIGATION     Allergies  Allergen Reactions  . Tizanidine Itching  . Aspirin Nausea And Vomiting    Tolerated baby aspirin, but with more than once per day of full strength for aches and pains - had stomach upset.  No history of PUD/gastric bleeding known.   . Statins Other (See Comments)    Myalgias and chest pain (has tried Lipitor and Pravachol)   Prior to Admission medications   Medication Sig Start Date End Date Taking? Authorizing Provider  acetaminophen (TYLENOL) 325 MG tablet Take 650 mg by mouth every 6 (six) hours as needed (pain).     [provider]  aspirin 81 MG tablet Take 81 mg by mouth daily.    [provider]  famotidine (PEPCID) 10 MG tablet Take 1 tablet (10 mg total) by mouth 2 (two) times daily. Patient taking differently: Take 10 mg by mouth daily as needed.  06/23/14   Love, [name], PA-C  lisinopril  (PRINIVIL,ZESTRIL) 20 MG tablet Take 20 mg by mouth daily.     [provider]  loratadine (ALAVERT) 10 MG tablet Take 10 mg by mouth daily as needed for allergies.    [provider]  metoprolol succinate (TOPROL-XL) 25 MG 24 hr tablet TAKE 1/2 (ONE-HALF) TABLET BY MOUTH ONCE DAILY 06/16/17   [name], MD  Multiple Vitamin (MULTIVITAMIN WITH MINERALS) TABS tablet Take 1 tablet by mouth daily. Reported on 11/22/2015    [provider]  Polyethyl Glycol-Propyl Glycol (SYSTANE OP) Place 1 drop into both eyes daily as needed (dry eyeys).    [provider]  senna-docusate (SENOKOT-S) 8.6-50 MG per tablet Take 3 tablets by mouth 2 (two) times daily. Patient taking differently: Take 2 tablets by mouth 2 (two) times daily.  06/23/14   Bary Leriche, PA-C   Social History   Socioeconomic History  . Marital status: Married    Spouse  name: Not on file  . Number of children: Not on file  . Years of education: Not on file  . Highest education level: Not on file  Social Needs  . Financial resource strain: Not on file  . Food insecurity - worry: Not on file  . Food insecurity - inability: Not on file  . Transportation needs - medical: Not on file  . Transportation needs - non-medical: Not on file  Occupational History  . Not on file  Tobacco Use  . Smoking status: Former Smoker    Years: 5.00  . Smokeless tobacco: Never Used  Substance and Sexual Activity  . Alcohol use: Yes    Alcohol/week: 0.6 oz    Types: 1 Glasses of wine per week    Comment: occasionally wine  . Drug use: No  . Sexual activity: Not on file  Other Topics Concern  . Not on file  Social History Narrative   Lives in [place]    Married. Education: [employer].     Review of Systems  Constitutional: Negative for chills and fever.  Eyes: Negative for pain and redness.  Respiratory: Negative for cough, choking and shortness of breath.   Gastrointestinal: Positive for  constipation and nausea. Negative for vomiting.  Musculoskeletal: Positive for back pain and myalgias.  Psychiatric/Behavioral: Positive for dysphoric mood. Negative for self-injury and suicidal ideas.       Objective:   Physical Exam  Constitutional: No distress.  HENT:  Head: Normocephalic and atraumatic.  Cardiovascular:  No significant edema.   Pulmonary/Chest: Effort normal. No respiratory distress.  Neurological: She is alert.  Skin: Skin is warm and dry.   Vitals:   08/19/17 1039  BP: 126/76  Pulse: 81  Resp: 17  Temp: 98.2 F (36.8 C)  TempSrc: Oral  SpO2: 98%   Results for orders placed or performed in visit on 08/19/17  Hemoglobin A1c  Result Value Ref Range   Hgb A1c MFr Bld 5.6 4.8 - 5.6 %   Est. average glucose Bld gHb Est-mCnc 114 mg/dL  Lipid panel  Result Value Ref Range   Cholesterol, Total 288 (H) 100 - 199 mg/dL   Triglycerides 115 0 - 149 mg/dL   HDL 61 >39 mg/dL   VLDL Cholesterol Cal 23 5 - 40 mg/dL   LDL Calculated 204 (H) 0 - 99 mg/dL   Comment: Comment    Chol/HDL Ratio 4.7 (H) 0.0 - 4.4 ratio  Comprehensive metabolic panel  Result Value Ref Range   Glucose 85 65 - 99 mg/dL   BUN 13 8 - 27 mg/dL   Creatinine, Ser 0.71 0.57 - 1.00 mg/dL   GFR calc non Af Amer 87 >59 mL/min/1.73   GFR calc Af Amer 100 >59 mL/min/1.73   BUN/Creatinine Ratio 18 12 - 28   Sodium 145 (H) 134 - 144 mmol/L   Potassium 4.2 3.5 - 5.2 mmol/L   Chloride 104 96 - 106 mmol/L   CO2 19 (L) 20 - 29 mmol/L   Calcium 9.4 8.7 - 10.3 mg/dL   Total Protein 7.5 6.0 - 8.5 g/dL   Albumin 4.3 3.6 - 4.8 g/dL   Globulin, Total 3.2 1.5 - 4.5 g/dL   Albumin/Globulin Ratio 1.3 1.2 - 2.2   Bilirubin Total 0.3 0.0 - 1.2 mg/dL   Alkaline Phosphatase 75 39 - 117 IU/L   AST 20 0 - 40 IU/L   ALT 26 0 - 32 IU/L  CBC  Result Value Ref Range   WBC 10.0  3.4 - 10.8 x10E3/uL   RBC 5.50 (H) 3.77 - 5.28 x10E6/uL   Hemoglobin 15.3 11.1 - 15.9 g/dL   Hematocrit 46.5 34.0 - 46.6 %   MCV  85 79 - 97 fL   MCH 27.8 26.6 - 33.0 pg   MCHC 32.9 31.5 - 35.7 g/dL   RDW 14.7 12.3 - 15.4 %   Platelets 395 (H) 150 - 379 x10E3/uL       Assessment & Plan:    Mary Le is a 72 y.o. female Essential hypertension - Plan: Comprehensive metabolic panel, metoprolol succinate (TOPROL-XL) 25 MG 24 hr tablet  - Stable. Check CMP, continue Toprol same dose.  Hyperlipidemia, unspecified hyperlipidemia type - Plan: Lipid panel, Comprehensive metabolic panel  -Check updated lipid panel, CMP, consider Repatha as intolerant to statins previously, and with history of CVA. May need to coordinate this through lipid clinic.   Prediabetes - Plan: Hemoglobin A1c  Constipation, unspecified constipation type - Plan: Ambulatory referral to Gastroenterology  - MiraLAX daily if needed, referred to gastroenterology.  Abdominal pain, generalized - Plan: CBC  -CBC reassuring, may be related to constipation. Plan as above, but also follow-up with gastroenterology. If fever, vomiting, worsening abdominal pain, RTC/ER precautions given  Chronic back pain, unspecified back location, unspecified back pain laterality Chronic pain syndrome  -Recommended follow-up with orthopedics as well as [facility] pain management and to determine next step in treatment.  -Appears to have depression, beyond adjustment disorder. Discussed antidepressant, but declined medications at present. Phone numbers provided for counseling initially, follow up to discuss symptoms further next few weeks.  Meds ordered this encounter  Medications  . metoprolol succinate (TOPROL-XL) 25 MG 24 hr tablet    Sig: TAKE 1/2 (ONE-HALF) TABLET BY MOUTH ONCE DAILY    Dispense:  45 tablet    Refill:  1   Patient Instructions   I would recommend meeting with ortho to decide if another injection is needed or other treatments. Also call [facility] pain management [phone]) to schedule follow up appointment. Let me know if other practice referral needed.    For depression symptoms, I would recommend meeting with therapist and possible medication. Ok to try counseling first, but follow up to discuss symptoms further next few weeks.  Delorise Jackson : [phone]   For constipation, I will refer you to gastroenterology as requested. Ok to take miralax daily for now. If fever, vomiting, or worsening pain - please return here or ER right away.   I will check cholesterol again, then may need to meet with lipid specialist to try to get Repatha covered. Goal LDL is under 70.    Constipation, Adult Constipation is when a person has fewer bowel movements in a week than normal, has difficulty having a bowel movement, or has stools that are dry, hard, or larger than normal. Constipation may be caused by an underlying condition. It may become worse with age if a person takes certain medicines and does not take in enough fluids. Follow these instructions at home: Eating and drinking   Eat foods that have a lot of fiber, such as fresh fruits and vegetables, whole grains, and beans.  Limit foods that are high in fat, low in fiber, or overly processed, such as french fries, hamburgers, cookies, candies, and soda.  Drink enough fluid to keep your urine clear or pale yellow. General instructions  Exercise regularly or as told by your health care provider.  Go to the restroom when you have  the urge to go. Do not hold it in.  Take over-the-counter and prescription medicines only as told by your health care provider. These include any fiber supplements.  Practice pelvic floor retraining exercises, such as deep breathing while relaxing the lower abdomen and pelvic floor relaxation during bowel movements.  Watch your condition for any changes.  Keep all follow-up visits as told by your health care provider. This is important. Contact a health care provider if:  You have pain that gets worse.  You have a fever.  You do not have a bowel  movement after 4 days.  You vomit.  You are not hungry.  You lose weight.  You are bleeding from the anus.  You have thin, pencil-like stools. Get help right away if:  You have a fever and your symptoms suddenly get worse.  You leak stool or have blood in your stool.  Your abdomen is bloated.  You have severe pain in your abdomen.  You feel dizzy or you faint. This information is not intended to replace advice given to you by your health care provider. Make sure you discuss any questions you have with your health care provider. Document Released: 04/05/2004 Document Revised: 01/26/2016 Document Reviewed: 12/27/2015 Elsevier Interactive Patient Education  2018 [employer].    IF you received an x-ray today, you will receive an invoice from [facility]. Please contact [facility] at [phone] with questions or concerns regarding your invoice.   IF you received labwork today, you will receive an invoice from [facility]. Please contact [facility] at [phone] with questions or concerns regarding your invoice.   Our billing staff will not be able to assist you with questions regarding bills from these companies.  You will be contacted with the lab results as soon as they are available. The fastest way to get your results is to activate your My Chart account. Instructions are located on the last page of this paperwork. If you have not heard from [place] regarding the results in 2 weeks, please contact this office.       I personally performed the services described in this documentation, which was scribed in my presence. The recorded information has been reviewed and considered for accuracy and completeness, addended by me as needed, and agree with information above.  Signed,   [name], MD Primary Care at [place].  08/22/17 6:45 PM

## 2017-08-20 ENCOUNTER — Ambulatory Visit: Payer: Medicare Other

## 2017-08-20 LAB — COMPREHENSIVE METABOLIC PANEL
ALK PHOS: 75 IU/L (ref 39–117)
ALT: 26 IU/L (ref 0–32)
AST: 20 IU/L (ref 0–40)
Albumin/Globulin Ratio: 1.3 (ref 1.2–2.2)
Albumin: 4.3 g/dL (ref 3.6–4.8)
BUN/Creatinine Ratio: 18 (ref 12–28)
BUN: 13 mg/dL (ref 8–27)
Bilirubin Total: 0.3 mg/dL (ref 0.0–1.2)
CALCIUM: 9.4 mg/dL (ref 8.7–10.3)
CO2: 19 mmol/L — AB (ref 20–29)
CREATININE: 0.71 mg/dL (ref 0.57–1.00)
Chloride: 104 mmol/L (ref 96–106)
GFR calc Af Amer: 100 mL/min/{1.73_m2} (ref >59)
GFR, EST NON AFRICAN AMERICAN: 87 mL/min/{1.73_m2} (ref >59)
GLOBULIN, TOTAL: 3.2 g/dL (ref 1.5–4.5)
Glucose: 85 mg/dL (ref 65–99)
POTASSIUM: 4.2 mmol/L (ref 3.5–5.2)
SODIUM: 145 mmol/L — AB (ref 134–144)
Total Protein: 7.5 g/dL (ref 6.0–8.5)

## 2017-08-20 LAB — CBC
HEMATOCRIT: 46.5 % (ref 34.0–46.6)
HEMOGLOBIN: 15.3 g/dL (ref 11.1–15.9)
MCH: 27.8 pg (ref 26.6–33.0)
MCHC: 32.9 g/dL (ref 31.5–35.7)
MCV: 85 fL (ref 79–97)
Platelets: 395 10*3/uL — ABNORMAL HIGH (ref 150–379)
RBC: 5.5 x10E6/uL — ABNORMAL HIGH (ref 3.77–5.28)
RDW: 14.7 % (ref 12.3–15.4)
WBC: 10 10*3/uL (ref 3.4–10.8)

## 2017-08-20 LAB — LIPID PANEL
CHOL/HDL RATIO: 4.7 ratio — AB (ref 0.0–4.4)
Cholesterol, Total: 288 mg/dL — ABNORMAL HIGH (ref 100–199)
HDL: 61 mg/dL (ref >39)
LDL CALC: 204 mg/dL — AB (ref 0–99)
Triglycerides: 115 mg/dL (ref 0–149)
VLDL Cholesterol Cal: 23 mg/dL (ref 5–40)

## 2017-08-20 LAB — HEMOGLOBIN A1C
Est. average glucose Bld gHb Est-mCnc: 114 mg/dL
HEMOGLOBIN A1C: 5.6 % (ref 4.8–5.6)

## 2017-08-21 ENCOUNTER — Ambulatory Visit: Payer: Medicare Other | Admitting: *Deleted

## 2017-08-22 ENCOUNTER — Encounter: Payer: Self-pay | Admitting: Family Medicine

## 2017-08-22 ENCOUNTER — Ambulatory Visit: Payer: Medicare Other

## 2017-08-22 NOTE — Progress Notes (Signed)
Not received  

## 2017-08-25 ENCOUNTER — Encounter: Payer: Self-pay | Admitting: Physical Medicine & Rehabilitation

## 2017-08-25 ENCOUNTER — Ambulatory Visit (HOSPITAL_BASED_OUTPATIENT_CLINIC_OR_DEPARTMENT_OTHER): Payer: Medicare Other | Admitting: Physical Medicine & Rehabilitation

## 2017-08-25 ENCOUNTER — Other Ambulatory Visit: Payer: Self-pay

## 2017-08-25 VITALS — BP 117/71 | HR 80

## 2017-08-25 DIAGNOSIS — G8110 Spastic hemiplegia affecting unspecified side: Secondary | ICD-10-CM

## 2017-08-25 DIAGNOSIS — I69854 Hemiplegia and hemiparesis following other cerebrovascular disease affecting left non-dominant side: Secondary | ICD-10-CM | POA: Insufficient documentation

## 2017-08-25 DIAGNOSIS — I10 Essential (primary) hypertension: Secondary | ICD-10-CM | POA: Diagnosis not present

## 2017-08-25 DIAGNOSIS — Z87891 Personal history of nicotine dependence: Secondary | ICD-10-CM | POA: Diagnosis not present

## 2017-08-25 DIAGNOSIS — E7800 Pure hypercholesterolemia, unspecified: Secondary | ICD-10-CM | POA: Insufficient documentation

## 2017-08-25 DIAGNOSIS — I63411 Cerebral infarction due to embolism of right middle cerebral artery: Secondary | ICD-10-CM

## 2017-08-25 DIAGNOSIS — E785 Hyperlipidemia, unspecified: Secondary | ICD-10-CM | POA: Insufficient documentation

## 2017-08-25 NOTE — Progress Notes (Signed)
Subjective:    Patient ID: Mary Le, female    DOB: 1946/05/17, 72 y.o.   MRN: [id number] 72 y.o. RH female with h/o HTN, morbid obesity, dyslipidemia who was admitted on  06/03/2014 with left-sided weakness. Cranial CT scan was  negative and patient underwent CTA head/neck which revealed distal right M1 occlusion with question of low density right temporal, parietal lobe and insula.   She underwent endovacular revascularization of R-MCA with thrombectomy by Dr. Estanislado Pandy.  MRI of the brain shows acute infarct right MCA territory in the region of the insula, frontoparietal and high parietal region.  HPI  Patient has had several Botox and Dysport injections which have been helpful.  Her last injection was 3 of 2018.  She was lost to follow-up.  The daughter states that she had the wrong phone number.  Phone number had not changed.  Patient had been here multiple occasions.  Has been previously lost to follow-up in the past as well Wants to discuss botox Dysport and Botox helped but Some itching noted with Dysport  Pain Inventory Average Pain 4 Pain Right Now 0 My pain is burning  In the last 24 hours, has pain interfered with the following? General activity 1 Relation with others 0 Enjoyment of life 0 What TIME of day is your pain at its worst? morning Sleep (in general) Good  Pain is worse with: na Pain improves with: medication Relief from Meds: 10  Mobility how many minutes can you walk? 4 do you drive?  no  Function I need assistance with the following:  dressing, bathing, toileting, meal prep, household duties and shopping  Neuro/Psych bladder control problems weakness trouble walking depression  Prior Studies Any changes since last visit?  no  Physicians involved in your care Any changes since last visit?  no   Family History  Problem Relation Age of Onset  . Coronary artery disease Mother 64       MI  . Kidney disease Father   . Breast cancer Neg Hx     Social History   Socioeconomic History  . Marital status: Married    Spouse name: None  . Number of children: None  . Years of education: None  . Highest education level: None  Social Needs  . Financial resource strain: None  . Food insecurity - worry: None  . Food insecurity - inability: None  . Transportation needs - medical: None  . Transportation needs - non-medical: None  Occupational History  . None  Tobacco Use  . Smoking status: Former Smoker    Years: 5.00  . Smokeless tobacco: Never Used  Substance and Sexual Activity  . Alcohol use: Yes    Alcohol/week: 0.6 oz    Types: 1 Glasses of wine per week    Comment: occasionally wine  . Drug use: No  . Sexual activity: None  Other Topics Concern  . None  Social History Narrative   Lives in [place]    Married. Education: [employer].    Past Surgical History:  Procedure Laterality Date  . APPENDECTOMY    . HEMORROIDECTOMY    . IR RADIOLOGY PERIPHERAL GUIDED IV START  06/18/2017  . IR US GUIDE VASC ACCESS RIGHT  06/18/2017  . LOOP RECORDER IMPLANT  06/07/2014   MDT LINQ implanted by Dr [name] for cryptogenic stroke  . LOOP RECORDER IMPLANT N/A 06/07/2014   Procedure: LOOP RECORDER IMPLANT;  Surgeon: [name], MD;  Location: [facility] CATH LAB;  Service: Cardiovascular;  Laterality: N/A;  . RADIOLOGY WITH ANESTHESIA N/A 06/03/2014   Procedure: RADIOLOGY WITH ANESTHESIA;  Surgeon: [name], MD;  Location: [facility];  Service: Radiology;  Laterality: N/A;  . TEE WITHOUT CARDIOVERSION N/A 06/07/2014   Procedure: TRANSESOPHAGEAL ECHOCARDIOGRAM (TEE);  Surgeon: [name], MD;  Location: [facility] ENDOSCOPY;  Service: Cardiovascular;  Laterality: N/A;  . TONSILLECTOMY    . TUBAL LIGATION     Past Medical History:  Diagnosis Date  . Back pain    arthritis  . CVA (cerebral infarction)   . Fall 11/2014   fell in bathroom, uses a cane  . Hypercholesteremia   . Hypertension   . Joint pain    back, history of    . Stroke ([place])   . Weakness    BP 117/71   Pulse 80   SpO2 93%   Opioid Risk Score:   Fall Risk Score:  `1  Depression screen PHQ 2/9  Depression screen [facility] 2/9 08/25/2017 01/09/2017 12/05/2016 11/22/2015 11/22/2015 07/24/2015 03/17/2015  Decreased Interest 1 0 0 0 0 0 0  Down, Depressed, Hopeless 1 0 0 0 0 0 0  PHQ - 2 Score 2 0 0 0 0 0 0  Altered sleeping - - - - - - -  Change in appetite - - - - - - -  Feeling bad or failure about yourself  - - - - - - -  Trouble concentrating - - - - - - -  Moving slowly or fidgety/restless - - - - - - -  Suicidal thoughts - - - - - - -  PHQ-9 Score - - - - - - -  Some recent data might be hidden    Review of Systems  HENT: Negative.   Eyes: Negative.   Respiratory: Negative.   Cardiovascular: Positive for leg swelling.  Gastrointestinal: Positive for constipation.  Endocrine: Negative.   Genitourinary:       Bladder control  Musculoskeletal: Positive for gait problem.  Skin: Negative.   Allergic/Immunologic: Negative.   Neurological: Positive for weakness.  Hematological: Negative.   Psychiatric/Behavioral: Positive for dysphoric mood.  All other systems reviewed and are negative.      Objective:   Physical Exam  Constitutional: She is oriented to person, place, and time. She appears well-developed and well-nourished.  HENT:  Head: Normocephalic and atraumatic.  Eyes: Conjunctivae and EOM are normal. Pupils are equal, round, and reactive to light.  Neurological: She is alert and oriented to person, place, and time.  Psychiatric: She has a normal mood and affect.  Nursing note and vitals reviewed.  Left upper extremity 0/5 strength in the deltoid bicep tricep finger flexors extensors as well as hand intrinsics. Tone modified [place] 3 in the pectoralis 3 in the elbow flexors 2 at the DIP, 0 at the PIP 0 at the FPL 3 at the lumbricals.       Assessment & Plan:  1.  History of right MCA distribution infarct 2015 with residual  chronic left spastic hemiplegia.  She has had some change in her tone in terms of muscle groups affected.  Will make adjustments accordingly. Discussed with patient and daughter agree with plan.  Pectoralis100 Brachioradialis50 Biceps 50 Lumbricals 25 FDP50 AYT01 Opponens pollicus25 Pronator 50

## 2017-08-25 NOTE — Patient Instructions (Signed)
Repeat Botox in 2 weeks

## 2017-08-29 ENCOUNTER — Encounter: Payer: Self-pay | Admitting: Gastroenterology

## 2017-09-08 ENCOUNTER — Encounter: Payer: Self-pay | Admitting: Physical Medicine & Rehabilitation

## 2017-09-08 ENCOUNTER — Encounter: Payer: Medicare Other | Attending: Physical Medicine & Rehabilitation

## 2017-09-08 ENCOUNTER — Ambulatory Visit (HOSPITAL_BASED_OUTPATIENT_CLINIC_OR_DEPARTMENT_OTHER): Payer: Medicare Other | Admitting: Physical Medicine & Rehabilitation

## 2017-09-08 VITALS — BP 132/59 | HR 79

## 2017-09-08 DIAGNOSIS — I10 Essential (primary) hypertension: Secondary | ICD-10-CM | POA: Diagnosis not present

## 2017-09-08 DIAGNOSIS — I69854 Hemiplegia and hemiparesis following other cerebrovascular disease affecting left non-dominant side: Secondary | ICD-10-CM | POA: Diagnosis not present

## 2017-09-08 DIAGNOSIS — G8110 Spastic hemiplegia affecting unspecified side: Secondary | ICD-10-CM

## 2017-09-08 DIAGNOSIS — E785 Hyperlipidemia, unspecified: Secondary | ICD-10-CM | POA: Diagnosis not present

## 2017-09-08 DIAGNOSIS — Z87891 Personal history of nicotine dependence: Secondary | ICD-10-CM | POA: Diagnosis not present

## 2017-09-08 DIAGNOSIS — E7800 Pure hypercholesterolemia, unspecified: Secondary | ICD-10-CM | POA: Diagnosis not present

## 2017-09-08 NOTE — Progress Notes (Signed)
Botox Injection for spasticity using needle EMG guidance  Dilution: 50 Units/ml Indication: Severe spasticity which interferes with ADL,mobility and/or  hygiene and is unresponsive to medication management and other conservative care Informed consent was obtained after describing risks and benefits of the procedure with the patient. This includes bleeding, bruising, infection, excessive weakness, or medication side effects. A REMS form is on file and signed. Needle: 27g 1" needle electrode Number of units per muscle Pectoralis100 Brachioradialis50 Biceps 50 Lumbricals 25 FDP50 DIX18 Opponens pollicus25 Pronator 50 All injections were done after obtaining appropriate EMG activity and after negative drawback for blood. The patient tolerated the procedure well. Post procedure instructions were given. A followup appointment was made.   Based on EMG activity would consider modification 0 OP 25 FPL 50 PQ

## 2017-09-08 NOTE — Patient Instructions (Signed)

## 2017-09-18 ENCOUNTER — Ambulatory Visit: Payer: Medicare Other | Admitting: Family Medicine

## 2017-09-18 ENCOUNTER — Ambulatory Visit: Payer: Medicare Other

## 2017-10-14 ENCOUNTER — Ambulatory Visit: Payer: Medicare Other | Admitting: Gastroenterology

## 2017-10-14 ENCOUNTER — Ambulatory Visit (INDEPENDENT_AMBULATORY_CARE_PROVIDER_SITE_OTHER): Payer: Medicare Other | Admitting: Gastroenterology

## 2017-10-14 ENCOUNTER — Encounter: Payer: Self-pay | Admitting: Gastroenterology

## 2017-10-14 VITALS — BP 128/84 | HR 88 | Ht 62.0 in | Wt 212.2 lb

## 2017-10-14 DIAGNOSIS — Z1211 Encounter for screening for malignant neoplasm of colon: Secondary | ICD-10-CM

## 2017-10-14 DIAGNOSIS — I63411 Cerebral infarction due to embolism of right middle cerebral artery: Secondary | ICD-10-CM

## 2017-10-14 DIAGNOSIS — K5904 Chronic idiopathic constipation: Secondary | ICD-10-CM

## 2017-10-14 DIAGNOSIS — Z1212 Encounter for screening for malignant neoplasm of rectum: Secondary | ICD-10-CM

## 2017-10-14 MED ORDER — NA SULFATE-K SULFATE-MG SULF 17.5-3.13-1.6 GM/177ML PO SOLN: 1.0000 | Freq: Once | ORAL | 0 refills | Status: AC

## 2017-10-14 NOTE — Progress Notes (Signed)
History of Present Illness: This is a 72 year old female referred by [name], MD for the evaluation of chronic constipation.  She is accompanied by her daughter.  She relates having constipation for many years.  Her constipation has worsened following her CVA in 2015.  She has been treated with various doses of MiraLAX and Senokot with modest success in controlling her symptoms.  She states she underwent colonoscopy in [place] over 10 years ago and believes it was normal exam.  She was experiencing nausea frequently however she recently stopped taking her vitamins and her nausea has abated.  Denies weight loss, abdominal pain, diarrhea, change in stool caliber, melena, hematochezia, vomiting, dysphagia, reflux symptoms, chest pain.   Allergies  Allergen Reactions  . Tizanidine Itching  . Aspirin Nausea And Vomiting    Tolerated baby aspirin, but with more than once per day of full strength for aches and pains - had stomach upset.  No history of PUD/gastric bleeding known.   . Statins Other (See Comments)    Myalgias and chest pain (has tried Lipitor and Pravachol)   Outpatient Medications Prior to Visit  Medication Sig Dispense Refill  . acetaminophen (TYLENOL) 325 MG tablet Take 650 mg by mouth every 6 (six) hours as needed (pain).     Marland Kitchen aspirin 81 MG tablet Take 81 mg by mouth daily.    . famotidine (PEPCID) 10 MG tablet Take 1 tablet (10 mg total) by mouth 2 (two) times daily. (Patient taking differently: Take 10 mg by mouth daily as needed. ) 60 tablet 1  . loratadine (ALAVERT) 10 MG tablet Take 10 mg by mouth daily as needed for allergies.    . metoprolol succinate (TOPROL-XL) 25 MG 24 hr tablet TAKE 1/2 (ONE-HALF) TABLET BY MOUTH ONCE DAILY 45 tablet 1  . Multiple Vitamin (MULTIVITAMIN WITH MINERALS) TABS tablet Take 1 tablet by mouth daily. Reported on 11/22/2015    . Polyethyl Glycol-Propyl Glycol (SYSTANE OP) Place 1 drop into both eyes daily as needed (dry eyeys).    .  polyethylene glycol powder (GLYCOLAX/MIRALAX) powder Take 1 Container by mouth as needed (AS NEEDED EVERY 2-3 DAYS).    Marland Kitchen senna-docusate (SENOKOT-S) 8.6-50 MG per tablet Take 3 tablets by mouth 2 (two) times daily. (Patient taking differently: Take 2 tablets by mouth 2 (two) times daily. ) 100 tablet 1   No facility-administered medications prior to visit.    Past Medical History:  Diagnosis Date  . Back pain    arthritis  . CVA (cerebral infarction)   . Fall 11/2014   fell in bathroom, uses a cane  . Hypercholesteremia   . Hypertension   . Joint pain    back, history of   . Stroke ([place])   . Weakness    Past Surgical History:  Procedure Laterality Date  . APPENDECTOMY    . HEMORROIDECTOMY    . IR RADIOLOGY PERIPHERAL GUIDED IV START  06/18/2017  . IR US GUIDE VASC ACCESS RIGHT  06/18/2017  . LOOP RECORDER IMPLANT  06/07/2014   MDT LINQ implanted by Dr [name] for cryptogenic stroke  . LOOP RECORDER IMPLANT N/A 06/07/2014   Procedure: LOOP RECORDER IMPLANT;  Surgeon: [name], MD;  Location: [facility];  Service: Cardiovascular;  Laterality: N/A;  . RADIOLOGY WITH ANESTHESIA N/A 06/03/2014   Procedure: RADIOLOGY WITH ANESTHESIA;  Surgeon: [name], MD;  Location: [facility];  Service: Radiology;  Laterality: N/A;  . TEE WITHOUT CARDIOVERSION N/A  06/07/2014   Procedure: TRANSESOPHAGEAL ECHOCARDIOGRAM (TEE);  Surgeon: [name], MD;  Location: [facility] ENDOSCOPY;  Service: Cardiovascular;  Laterality: N/A;  . TONSILLECTOMY    . TUBAL LIGATION     Social History   Socioeconomic History  . Marital status: Married    Spouse name: Not on file  . Number of children: 1  . Years of education: Not on file  . Highest education level: Not on file  Occupational History  . Occupation: RETIRED  Social Needs  . Financial resource strain: Not on file  . Food insecurity:    Worry: Not on file    Inability: Not on file  . Transportation needs:    Medical: Not on file     Non-medical: Not on file  Tobacco Use  . Smoking status: Former Smoker    Years: 5.00  . Smokeless tobacco: Never Used  Substance and Sexual Activity  . Alcohol use: Yes    Alcohol/week: 0.6 oz    Types: 1 Glasses of wine per week    Comment: occasionally wine  . Drug use: No  . Sexual activity: Not on file  Lifestyle  . Physical activity:    Days per week: Not on file    Minutes per session: Not on file  . Stress: Not on file  Relationships  . Social connections:    Talks on phone: Not on file    Gets together: Not on file    Attends religious service: Not on file    Active member of club or organization: Not on file    Attends meetings of clubs or organizations: Not on file    Relationship status: Not on file  Other Topics Concern  . Not on file  Social History Narrative   Lives in [place]    Married. Education: [employer].    Family History  Problem Relation Age of Onset  . Coronary artery disease Mother 20       MI  . Kidney disease Father   . Breast cancer Daughter   . Stomach cancer Other   . Colon cancer Neg Hx   . Liver cancer Neg Hx        Review of Systems: Pertinent positive and negative review of systems were noted in the above HPI section. All other review of systems were otherwise negative.    Physical Exam: General: Well developed, well nourished, in a wheelchair, no acute distress, she can ambulate with assistance Head: Normocephalic and atraumatic Eyes:  sclerae anicteric, EOMI Ears: Normal auditory acuity Mouth: No deformity or lesions Neck: Supple, no masses or thyromegaly Lungs: Clear throughout to auscultation Heart: Regular rate and rhythm; no murmurs, rubs or bruits Abdomen: Soft, non tender and non distended. No masses, hepatosplenomegaly or hernias noted. Normal Bowel sounds Rectal: Deferred to colonoscopy Musculoskeletal: Symmetrical with no gross deformities  Skin: No lesions on visible extremities Pulses:  Normal pulses  noted Extremities: No clubbing, cyanosis, edema or deformities noted Neurological: Alert oriented x 4.  Left upper extremity plegia. Cervical Nodes:  No significant cervical adenopathy Inguinal Nodes: No significant inguinal adenopathy Psychological:  Alert and cooperative. Normal mood and affect  Assessment and Recommendations:  1. Chronic idiopathic constipation.  Increase MiraLAX to daily dosing.  Adjust dosing for at least one adequate bowel movement per day.  Contact us within 1-2 weeks if this regimen is not working for further advice.  2.  CRC screening, average risk.  Schedule colonoscopy.  The risks (including bleeding, perforation, infection,  missed lesions, medication reactions and possible hospitalization or surgery if complications occur), benefits, and alternatives to colonoscopy with possible biopsy and possible polypectomy were discussed with the patient and they consent to proceed.     cc: [name], MD [address] [place], [place] [address]

## 2017-10-14 NOTE — Patient Instructions (Signed)
You have been scheduled for a colonoscopy. Please follow written instructions given to you at your visit today.  Please pick up your prep supplies at the pharmacy within the next 1-3 days. If you use inhalers (even only as needed), please bring them with you on the day of your procedure. Your physician has requested that you go to www.startemmi.com and enter the access code given to you at your visit today. This web site gives a general overview about your procedure. However, you should still follow specific instructions given to you by our office regarding your preparation for the procedure.  Continue Miralax daily, not as needed.   Normal BMI (Body Mass Index- based on height and weight) is between 23 and 30. Your BMI today is Body mass index is 38.82 kg/m. Marland Kitchen Please consider follow up  regarding your BMI with your Primary Care Provider.  Thank you for choosing me and [facility].  Pricilla Riffle. Dagoberto Ligas., MD., Marval Regal

## 2017-10-20 ENCOUNTER — Ambulatory Visit: Payer: Medicare Other | Admitting: Physical Medicine & Rehabilitation

## 2017-10-27 ENCOUNTER — Telehealth: Payer: Self-pay | Admitting: Family Medicine

## 2017-10-27 NOTE — Telephone Encounter (Signed)
Pt brought in forms to be completed by Dr Carlota Raspberry. I will place the forms in Dr Vonna Kotyk box on 10/27/17 please call the pt when the forms are ready to be picked up and make sure a copy is placed in the to be scan box at the 102 checkout desk.

## 2017-10-31 NOTE — Telephone Encounter (Signed)
Paperwork completed, but I am not sure about what needs to be entered for question D regarding work capacity.  Does that pertain to Odell the patient or [name] her primary caregiver?  Please complete that question with applicable information, then form will be done. Placed in nurse box at 102.  Thank you.

## 2017-11-03 NOTE — Telephone Encounter (Signed)
This is not a FMLA forms the forms should have been returned to [place] to handle not me I will route back to Clinical

## 2017-11-04 NOTE — Telephone Encounter (Signed)
Forms located. Phone call to caseworker Dian Situ at [phone]. Left detailed message for Verdene Lennert to return call to clinic regarding paperwork question.

## 2017-11-06 NOTE — Telephone Encounter (Addendum)
Return phone call to [place]. She states section D is for verification of how Mary Le is needed for Mary Le's care. Forms completed, signed by Dr. Carlota Raspberry.   Phone call to patient. Mary Le notified paperwork has been completed, will place up at front desk. She states she will have her daughter Mary Le pick forms up.

## 2017-11-21 ENCOUNTER — Telehealth: Payer: Self-pay | Admitting: Gastroenterology

## 2017-11-21 MED ORDER — NA SULFATE-K SULFATE-MG SULF 17.5-3.13-1.6 GM/177ML PO SOLN: 1.0000 | Freq: Once | ORAL | 0 refills | Status: AC

## 2017-11-21 NOTE — Telephone Encounter (Signed)
Prescription sent to patient's pharmacy.

## 2017-11-24 ENCOUNTER — Encounter: Payer: Self-pay | Admitting: Gastroenterology

## 2017-11-24 ENCOUNTER — Ambulatory Visit (AMBULATORY_SURGERY_CENTER): Payer: Medicare Other | Admitting: Gastroenterology

## 2017-11-24 ENCOUNTER — Other Ambulatory Visit: Payer: Self-pay

## 2017-11-24 VITALS — BP 130/64 | HR 93 | Temp 97.4°F | Resp 13 | Ht 62.0 in | Wt 212.0 lb

## 2017-11-24 DIAGNOSIS — Z1211 Encounter for screening for malignant neoplasm of colon: Secondary | ICD-10-CM

## 2017-11-24 DIAGNOSIS — K635 Polyp of colon: Secondary | ICD-10-CM

## 2017-11-24 DIAGNOSIS — Z8673 Personal history of transient ischemic attack (TIA), and cerebral infarction without residual deficits: Secondary | ICD-10-CM | POA: Diagnosis not present

## 2017-11-24 DIAGNOSIS — I10 Essential (primary) hypertension: Secondary | ICD-10-CM | POA: Diagnosis not present

## 2017-11-24 DIAGNOSIS — D123 Benign neoplasm of transverse colon: Secondary | ICD-10-CM | POA: Diagnosis not present

## 2017-11-24 DIAGNOSIS — D122 Benign neoplasm of ascending colon: Secondary | ICD-10-CM | POA: Diagnosis not present

## 2017-11-24 DIAGNOSIS — Z1212 Encounter for screening for malignant neoplasm of rectum: Secondary | ICD-10-CM

## 2017-11-24 MED ORDER — SODIUM CHLORIDE 0.9 % IV SOLN: 500.0000 mL | Freq: Once | INTRAVENOUS | Status: DC

## 2017-11-24 NOTE — Op Note (Signed)
[facility] Patient Name: Mary Le Procedure Date: 11/24/2017 2:46 PM MRN: [id number] Endoscopist: Ladene Artist , MD Age: 72 Referring MD:  Date of Birth: 10-01-1945 Gender: Female Account #: [id number] Procedure:                Colonoscopy Indications:              Screening for colorectal malignant neoplasm Medicines:                Monitored Anesthesia Care Procedure:                Pre-Anesthesia Assessment:                           - Prior to the procedure, a History and Physical                            was performed, and patient medications and                            allergies were reviewed. The patient's tolerance of                            previous anesthesia was also reviewed. The risks                            and benefits of the procedure and the sedation                            options and risks were discussed with the patient.                            All questions were answered, and informed consent                            was obtained. Prior Anticoagulants: The patient has                            taken no previous anticoagulant or antiplatelet                            agents. ASA Grade Assessment: III - A patient with                            severe systemic disease. After reviewing the risks                            and benefits, the patient was deemed in                            satisfactory condition to undergo the procedure.                           After obtaining informed consent, the colonoscope  was passed under direct vision. Throughout the                            procedure, the patient's blood pressure, pulse, and                            oxygen saturations were monitored continuously. The                            Colonoscope was introduced through the anus and                            advanced to the the cecum, identified by                            appendiceal orifice and  ileocecal valve. The                            ileocecal valve, appendiceal orifice, and rectum                            were photographed. The quality of the bowel                            preparation was excellent. The colonoscopy was                            performed without difficulty. The patient tolerated                            the procedure well. Scope In: 2:46:53 PM Scope Out: 3:00:05 PM Scope Withdrawal Time: 0 hours 10 minutes 37 seconds  Total Procedure Duration: 0 hours 13 minutes 12 seconds  Findings:                 The perianal and digital rectal examinations were                            normal.                           Five sessile polyps were found in the transverse                            colon (2), hepatic flexure and ascending colon (2).                            The polyps were 6 to 7 mm in size. These polyps                            were removed with a cold snare. Resection and                            retrieval were complete.  Patchy areas of mildly erythematous mucosa was                            found in the rectum - nonspecific finding.                           The exam was otherwise without abnormality on                            direct and retroflexion views. Complications:            No immediate complications. Estimated blood loss:                            None. Estimated Blood Loss:     Estimated blood loss: none. Impression:               - Five 6 to 7 mm polyps in the transverse colon, at                            the hepatic flexure and in the ascending colon,                            removed with a cold snare. Resected and retrieved.                           - Mild nonspecific rrythematous mucosa in the                            rectum.                           - The examination was otherwise normal on direct                            and retroflexion views. Recommendation:           -  Repeat colonoscopy in 3 - 5 years for                            surveillance if polyp(s) are precancerous, pending                            pathology review. No plans for future screening                            colonoscopy if all polyps are not precancerous.                           - Patient has a contact number available for                            emergencies. The signs and symptoms of potential  delayed complications were discussed with the                            patient. Return to normal activities tomorrow.                            Written discharge instructions were provided to the                            patient.                           - Resume previous diet.                           - Continue present medications.                           - Await pathology results. Ladene Artist, MD 11/24/2017 3:05:27 PM This report has been signed electronically.

## 2017-11-24 NOTE — Patient Instructions (Signed)
YOU HAD AN ENDOSCOPIC PROCEDURE TODAY AT [facility]:   Refer to the procedure report that was given to you for any specific questions about what was found during the examination.  If the procedure report does not answer your questions, please call your gastroenterologist to clarify.  If you requested that your care partner not be given the details of your procedure findings, then the procedure report has been included in a sealed envelope for you to review at your convenience later.  YOU SHOULD EXPECT: Some feelings of bloating in the abdomen. Passage of more gas than usual.  Walking can help get rid of the air that was put into your GI tract during the procedure and reduce the bloating. If you had a lower endoscopy (such as a colonoscopy or flexible sigmoidoscopy) you may notice spotting of blood in your stool or on the toilet paper. If you underwent a bowel prep for your procedure, you may not have a normal bowel movement for a few days.  Please Note:  You might notice some irritation and congestion in your nose or some drainage.  This is from the oxygen used during your procedure.  There is no need for concern and it should clear up in a day or so.  SYMPTOMS TO REPORT IMMEDIATELY:   Following lower endoscopy (colonoscopy or flexible sigmoidoscopy):  Excessive amounts of blood in the stool  Significant tenderness or worsening of abdominal pains  Swelling of the abdomen that is new, acute  Fever of 100F or higher   For urgent or emergent issues, a gastroenterologist can be reached at any hour by calling [phone].   DIET:  We do recommend a small meal at first, but then you may proceed to your regular diet.  Drink plenty of fluids but you should avoid alcoholic beverages for 24 hours.  ACTIVITY:  You should plan to take it easy for the rest of today and you should NOT DRIVE or use heavy machinery until tomorrow (because of the sedation medicines used during the test).     FOLLOW UP: Our staff will call the number listed on your records the next business day following your procedure to check on you and address any questions or concerns that you may have regarding the information given to you following your procedure. If we do not reach you, we will leave a message.  However, if you are feeling well and you are not experiencing any problems, there is no need to return our call.  We will assume that you have returned to your regular daily activities without incident.  If any biopsies were taken you will be contacted by phone or by letter within the next 1-3 weeks.  Please call us at [phone] if you have not heard about the biopsies in 3 weeks.    SIGNATURES/CONFIDENTIALITY: You and/or your care partner have signed paperwork which will be entered into your electronic medical record.  These signatures attest to the fact that that the information above on your After Visit Summary has been reviewed and is understood.  Full responsibility of the confidentiality of this discharge information lies with you and/or your care-partner.   Handouts were given to your care partner on polyps, hemorrhoids, and a high fiber diet with liberal fluid intake. You may resume your current medications today. Await biopsy results. Please call if any questions or concerns.

## 2017-11-24 NOTE — Progress Notes (Signed)
No problems noted in the recovery room. Maw  Pt was assisted with cleaning and dressing. maw

## 2017-11-24 NOTE — Progress Notes (Signed)
Called to room to assist during endoscopic procedure.  Patient ID and intended procedure confirmed with present staff. Received instructions for my participation in the procedure from the performing physician.  

## 2017-11-24 NOTE — Progress Notes (Signed)
Report given to PACU, vss 

## 2017-11-25 ENCOUNTER — Telehealth: Payer: Self-pay

## 2017-11-25 NOTE — Telephone Encounter (Signed)
  Follow up Call-  Call back number 11/24/2017  Post procedure Call Back phone  # [phone]  Permission to leave phone message Yes  Some recent data might be hidden     Patient questions:  Do you have a fever, pain , or abdominal swelling? No. Pain Score  0 *  Have you tolerated food without any problems? Yes.    Have you been able to return to your normal activities? Yes.    Do you have any questions about your discharge instructions: Diet   No. Medications  No. Follow up visit  No.  Do you have questions or concerns about your Care? No.  Actions: * If pain score is 4 or above: No action needed, pain <4.

## 2017-12-02 ENCOUNTER — Encounter: Payer: Self-pay | Admitting: Gastroenterology

## 2018-01-08 ENCOUNTER — Ambulatory Visit (HOSPITAL_BASED_OUTPATIENT_CLINIC_OR_DEPARTMENT_OTHER): Payer: Medicare Other | Admitting: Physical Medicine & Rehabilitation

## 2018-01-08 ENCOUNTER — Encounter: Payer: Self-pay | Admitting: Physical Medicine & Rehabilitation

## 2018-01-08 ENCOUNTER — Other Ambulatory Visit: Payer: Self-pay

## 2018-01-08 ENCOUNTER — Encounter: Payer: Medicare Other | Attending: Physical Medicine & Rehabilitation

## 2018-01-08 VITALS — BP 130/50 | HR 82 | Ht 62.0 in | Wt 212.0 lb

## 2018-01-08 DIAGNOSIS — G8110 Spastic hemiplegia affecting unspecified side: Secondary | ICD-10-CM | POA: Diagnosis not present

## 2018-01-08 DIAGNOSIS — R6889 Other general symptoms and signs: Secondary | ICD-10-CM | POA: Diagnosis not present

## 2018-01-08 DIAGNOSIS — R252 Cramp and spasm: Secondary | ICD-10-CM | POA: Diagnosis not present

## 2018-01-08 NOTE — Patient Instructions (Signed)

## 2018-01-08 NOTE — Progress Notes (Signed)
Botox Injection for spasticity using needle EMG guidance  Dilution: 50 Units/ml Indication: Severe spasticity which interferes with ADL,mobility and/or  hygiene and is unresponsive to medication management and other conservative care Informed consent was obtained after describing risks and benefits of the procedure with the patient. This includes bleeding, bruising, infection, excessive weakness, or medication side effects. A REMS form is on file and signed. Needle: 27g 1" needle electrode Number of units per muscle Pectoralis100 Brachioradialis50 Biceps 50 Lumbricals 75 FDP50 FPL25  Pronator T 50 Pronator Q 25 All injections were done after obtaining appropriate EMG activity and after negative drawback for blood. The patient tolerated the procedure well. Post procedure instructions were given. A followup appointment was made.   Based on EMG activity would consider modification Pectoralis100 Brachioradialis50 Biceps 50 FDP50 Pronator T 50

## 2018-02-19 ENCOUNTER — Ambulatory Visit: Payer: Medicare Other | Admitting: Physical Medicine & Rehabilitation

## 2018-02-23 ENCOUNTER — Ambulatory Visit: Payer: Medicare Other | Admitting: Physical Medicine & Rehabilitation

## 2018-02-26 ENCOUNTER — Ambulatory Visit (HOSPITAL_BASED_OUTPATIENT_CLINIC_OR_DEPARTMENT_OTHER): Payer: Medicare Other | Admitting: Physical Medicine & Rehabilitation

## 2018-02-26 ENCOUNTER — Other Ambulatory Visit: Payer: Self-pay

## 2018-02-26 ENCOUNTER — Encounter: Payer: Medicare Other | Attending: Physical Medicine & Rehabilitation

## 2018-02-26 ENCOUNTER — Encounter: Payer: Self-pay | Admitting: Physical Medicine & Rehabilitation

## 2018-02-26 ENCOUNTER — Ambulatory Visit: Payer: Medicare Other | Admitting: Physical Medicine & Rehabilitation

## 2018-02-26 VITALS — BP 129/81 | HR 84 | Ht 60.0 in | Wt 212.0 lb

## 2018-02-26 DIAGNOSIS — R252 Cramp and spasm: Secondary | ICD-10-CM | POA: Diagnosis not present

## 2018-02-26 DIAGNOSIS — G8110 Spastic hemiplegia affecting unspecified side: Secondary | ICD-10-CM | POA: Diagnosis not present

## 2018-02-26 DIAGNOSIS — I63411 Cerebral infarction due to embolism of right middle cerebral artery: Secondary | ICD-10-CM | POA: Diagnosis not present

## 2018-02-26 DIAGNOSIS — R6889 Other general symptoms and signs: Secondary | ICD-10-CM | POA: Insufficient documentation

## 2018-02-26 NOTE — Progress Notes (Signed)
Subjective:    Patient ID: Mary Le, female    DOB: December 31, 1945, 72 y.o.   MRN: [id number] 72 y.o. RH female with h/o HTN, morbid obesity, dyslipidemia who was admitted on  06/03/2014 with left-sided weakness. Cranial CT scan was  negative and patient underwent CTA head/neck which revealed distal right M1 occlusion with question of low density right temporal, parietal lobe and insula.   She underwent endovacular revascularization of R-MCA with thrombectomy by Dr. Estanislado Pandy.  MRI of the brain shows acute infarct right MCA territory in the region of the insula, frontoparietal and high parietal region.  HPI   Patient follows up after Botox injection performed on 01/08/2018 Number of units per muscle Pectoralis100 Brachioradialis50 Biceps 50 Lumbricals 12 FDP50 FPL25  Patient feels like her shoulder is looser and her elbow is looser as well.  She still feels a lot of tightness in her fingers. Patient has a left wrist hand orthosis which was fabricated more than 2 years ago.  She does not think it fits very well and would like OT to reevaluate. Pain Inventory Average Pain 4 Pain Right Now 0 My pain is none  In the last 24 hours, has pain interfered with the following? General activity 0 Relation with others 0 Enjoyment of life 1 What TIME of day is your pain at its worst? n/a Sleep (in general) Good  Pain is worse with: inactivity Pain improves with: medication Relief from Meds: 7  Mobility how many minutes can you walk? 2 ability to climb steps?  yes do you drive?  no use a wheelchair needs help with transfers  Function retired  Neuro/Psych No problems in this area  Prior Studies Any changes since last visit?  no  Physicians involved in your care Any changes since last visit?  no   Family History  Problem Relation Age of Onset  . Coronary artery disease Mother 36       MI  . Kidney disease Father   . Breast cancer Daughter   . Stomach cancer Other   .  Colon cancer Neg Hx   . Liver cancer Neg Hx   . Colon polyps Neg Hx   . Esophageal cancer Neg Hx   . Rectal cancer Neg Hx    Social History   Socioeconomic History  . Marital status: Married    Spouse name: Not on file  . Number of children: 1  . Years of education: Not on file  . Highest education level: Not on file  Occupational History  . Occupation: RETIRED  Social Needs  . Financial resource strain: Not on file  . Food insecurity:    Worry: Not on file    Inability: Not on file  . Transportation needs:    Medical: Not on file    Non-medical: Not on file  Tobacco Use  . Smoking status: Former Smoker    Years: 5.00  . Smokeless tobacco: Never Used  . Tobacco comment: quit smoking 40 years ago  Substance and Sexual Activity  . Alcohol use: Yes    Alcohol/week: 1.0 standard drinks    Types: 1 Glasses of wine per week    Comment: occasionally wine  . Drug use: No  . Sexual activity: Not on file  Lifestyle  . Physical activity:    Days per week: Not on file    Minutes per session: Not on file  . Stress: Not on file  Relationships  . Social connections:    Talks  on phone: Not on file    Gets together: Not on file    Attends religious service: Not on file    Active member of club or organization: Not on file    Attends meetings of clubs or organizations: Not on file    Relationship status: Not on file  Other Topics Concern  . Not on file  Social History Narrative   Lives in [place]    Married. Education: [employer].    Past Surgical History:  Procedure Laterality Date  . APPENDECTOMY    . HEMORROIDECTOMY    . IR RADIOLOGY PERIPHERAL GUIDED IV START  06/18/2017  . IR US GUIDE VASC ACCESS RIGHT  06/18/2017  . LOOP RECORDER IMPLANT  06/07/2014   MDT LINQ implanted by Dr [name] for cryptogenic stroke  . LOOP RECORDER IMPLANT N/A 06/07/2014   Procedure: LOOP RECORDER IMPLANT;  Surgeon: [name], MD;  Location: [facility] CATH [facility];  Service: Cardiovascular;   Laterality: N/A;  . RADIOLOGY WITH ANESTHESIA N/A 06/03/2014   Procedure: RADIOLOGY WITH ANESTHESIA;  Surgeon: [name], MD;  Location: [facility];  Service: Radiology;  Laterality: N/A;  . TEE WITHOUT CARDIOVERSION N/A 06/07/2014   Procedure: TRANSESOPHAGEAL ECHOCARDIOGRAM (TEE);  Surgeon: [name], MD;  Location: [facility] ENDOSCOPY;  Service: Cardiovascular;  Laterality: N/A;  . TONSILLECTOMY    . TUBAL LIGATION     Past Medical History:  Diagnosis Date  . Allergy   . Anxiety   . Arthritis    back  . Back pain    arthritis  . Clotting disorder ([place])   . CVA (cerebral infarction)   . Depression   . Fall 11/2014   fell in bathroom, uses a cane  . Hypercholesteremia   . Hypertension   . Joint pain    back, history of   . Stroke ([place])   . Weakness    BP 129/81   Pulse 84   Ht 5' (1.524 m)   Wt 212 lb (96.2 kg) Comment: pt reported, in wheelchair  SpO2 94%   BMI 41.40 kg/m   Opioid Risk Score:   Fall Risk Score:  `1  Depression screen PHQ 2/9  Depression screen [facility] 2/9 02/26/2018 01/08/2018 08/25/2017 01/09/2017 12/05/2016 11/22/2015 11/22/2015  Decreased Interest 1 1 1  0 0 0 0  Down, Depressed, Hopeless 1 1 1  0 0 0 0  PHQ - 2 Score 2 2 2  0 0 0 0  Altered sleeping - - - - - - -  Change in appetite - - - - - - -  Feeling bad or failure about yourself  - - - - - - -  Trouble concentrating - - - - - - -  Moving slowly or fidgety/restless - - - - - - -  Suicidal thoughts - - - - - - -  PHQ-9 Score - - - - - - -  Some recent data might be hidden     Review of Systems  Constitutional: Negative.   HENT: Negative.   Eyes: Negative.   Respiratory: Negative.   Cardiovascular: Negative.   Gastrointestinal: Negative.   Endocrine: Negative.   Genitourinary: Negative.   Musculoskeletal: Negative.   Skin: Negative.   Allergic/Immunologic: Negative.   Neurological: Negative.   Hematological: Negative.   Psychiatric/Behavioral: Negative.   All other systems reviewed and  are negative.      Objective:   Physical Exam  Constitutional: She is oriented to person, place, and time. She appears well-developed and  well-nourished. No distress.  HENT:  Head: Normocephalic and atraumatic.  Eyes: Pupils are equal, round, and reactive to light. EOM are normal.  Neurological: She is alert and oriented to person, place, and time.  Skin: Skin is warm and dry. She is not diaphoretic.  Psychiatric: She has a normal mood and affect.  Nursing note and vitals reviewed.  Tone MAS 1 at the shoulder MAS 1 at the elbow flexors MAS 1 at the wrist flexor MAS 3 at the finger flexors MAS 2 at the thumb flexor MAS 3 at the pronators  Motor strength is 2- at the deltoid trace bicep 0 finger flexors and extensors as well as wrist flexors and extensors on the left side 5/5 strength on the right side       Assessment & Plan:   #1.  Severe right MCA infarct 2015 residual left spastic hemiplegia patient is mainly in the wheelchair.  She still has significant finger flexor tone and pronator tone even after the Botox injection.  She has pain with attempted finger range of motion as well as supination Recommend repeat Botox with the following changes in approximately 6 weeks Pectoralis100 Brachioradialis50 Biceps 50 FDS50 FDP50 FPL25 PT 50 PQ 25  Referral to outpatient OT to reevaluate splint.  May consider OT for finger and upper extremity range of motion and positioning

## 2018-02-26 NOTE — Patient Instructions (Signed)
Will adjust muscle group selection for botox next visit to improve effect

## 2018-03-09 ENCOUNTER — Ambulatory Visit: Payer: Medicare Other | Admitting: Occupational Therapy

## 2018-03-10 ENCOUNTER — Telehealth: Payer: Self-pay | Admitting: Family Medicine

## 2018-03-10 ENCOUNTER — Ambulatory Visit: Payer: Self-pay | Admitting: Family Medicine

## 2018-03-10 DIAGNOSIS — I10 Essential (primary) hypertension: Secondary | ICD-10-CM

## 2018-03-10 MED ORDER — METOPROLOL SUCCINATE ER 25 MG PO TB24: ORAL_TABLET | ORAL | 1 refills | Status: DC

## 2018-03-10 NOTE — Telephone Encounter (Signed)
ToprolXL 25 mg refill Last Refill:08/19/17 # 45 Last OV: 08/19/17 PCP: [name] Pharmacy:[facility].  [place]

## 2018-03-10 NOTE — Telephone Encounter (Signed)
Copied from [place] [id number]. Topic: Quick Communication - See Telephone Encounter >> Mar 10, 2018  2:32 PM Mylinda Latina, NT wrote: CRM for notification. See Telephone encounter for: 03/10/18. Patient daughter called and states the patient had an appt today, but they are having car trouble. Had to r/s appt to 03/20/18. She needs a refill of her metoprolol succinate (TOPROL-XL) 25 MG 24 hr tablet . The daughter was wondering can a weeks worth RX be sent to her pharmacy to hold her over until her next appt.  [place], [place] - [address] [phone] (Phone) [phone] (Fax)

## 2018-03-20 ENCOUNTER — Ambulatory Visit: Payer: Medicare Other | Admitting: Family Medicine

## 2018-03-25 ENCOUNTER — Ambulatory Visit (INDEPENDENT_AMBULATORY_CARE_PROVIDER_SITE_OTHER): Payer: Medicare Other | Admitting: Physician Assistant

## 2018-03-25 ENCOUNTER — Other Ambulatory Visit: Payer: Self-pay

## 2018-03-25 ENCOUNTER — Encounter: Payer: Self-pay | Admitting: Physician Assistant

## 2018-03-25 VITALS — BP 110/78 | HR 96 | Temp 98.6°F | Resp 18 | Ht 60.0 in

## 2018-03-25 DIAGNOSIS — E785 Hyperlipidemia, unspecified: Secondary | ICD-10-CM

## 2018-03-25 DIAGNOSIS — I10 Essential (primary) hypertension: Secondary | ICD-10-CM

## 2018-03-25 DIAGNOSIS — Z23 Encounter for immunization: Secondary | ICD-10-CM | POA: Diagnosis not present

## 2018-03-25 DIAGNOSIS — I63311 Cerebral infarction due to thrombosis of right middle cerebral artery: Secondary | ICD-10-CM

## 2018-03-25 MED ORDER — METOPROLOL SUCCINATE ER 25 MG PO TB24: ORAL_TABLET | ORAL | 1 refills | Status: DC

## 2018-03-25 NOTE — Progress Notes (Signed)
Mary Le  MRN: [id number] DOB: [birth year]  PCP: [name], MD  Chief Complaint  Patient presents with  . Medication Refill    medication follow up and needs to discuss flying and wheelchair     Subjective:  Pt presents to clinic for HTN medication refill. She has no problems with any of her medications.  She is planning on moving to [facility] with her daughter.  She wants to make sure it is ok that she flies.  There is an aid to help with the move in the airport.  Lipids - is not at goal since embolic stroke but patient has never been able to tolerate a statin.  She has never been on Zetia and has not tried red yeast rice.  She does not other natural supplements but there has not been a change in her LDL levels.  She has had no TIA or stroke symptoms within the last 6 months.  History is obtained by patient.  Review of Systems  Constitutional: Negative for chills and fever.  Eyes: Negative for visual disturbance.  Respiratory: Negative for shortness of breath.   Cardiovascular: Negative for chest pain, palpitations and leg swelling.  Neurological: Negative for dizziness, light-headedness and headaches.    Patient Active Problem List   Diagnosis Date Noted  . HTN (hypertension) 07/26/2015  . Obesity (BMI 30.0-34.9) 07/26/2015  . Syncope 12/06/2014  . Spastic hemiplegia affecting nondominant side ([place]) 07/25/2014  . 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 Left hemiparesis ([place]) 06/10/2014  . Left-sided neglect 06/10/2014  . Thrombotic stroke involving right middle cerebral artery ([place]) 06/08/2014  . Acute respiratory failure with hypoxia ([place]) 06/05/2014  . Cerebral infarction due to occlusion of right middle cerebral artery ([place]) 06/03/2014  . Cerebral infarct ([place]) 06/03/2014  . Altered mental status 06/03/2014  . GERD (gastroesophageal reflux disease) 04/12/2014  . Dyslipidemia- statin intol 02/07/2014  . Chest pain-  Low risk Myoview July 2015 02/06/2014    Current Outpatient Medications on File Prior to Visit  Medication Sig Dispense Refill  . acetaminophen (TYLENOL) 325 MG tablet Take 650 mg by mouth every 6 (six) hours as needed (pain).     Marland Kitchen aspirin 81 MG tablet Take 81 mg by mouth daily.    . famotidine (PEPCID) 10 MG tablet Take 1 tablet (10 mg total) by mouth 2 (two) times daily. (Patient taking differently: Take 10 mg by mouth daily as needed. ) 60 tablet 1  . loratadine (ALAVERT) 10 MG tablet Take 10 mg by mouth daily as needed for allergies.    . Multiple Vitamin (MULTIVITAMIN WITH MINERALS) TABS tablet Take 1 tablet by mouth daily. Reported on 11/22/2015    . Polyethyl Glycol-Propyl Glycol (SYSTANE OP) Place 1 drop into both eyes daily as needed (dry eyeys).    . polyethylene glycol powder (GLYCOLAX/MIRALAX) powder Take 1 Container by mouth as needed (AS NEEDED EVERY 2-3 DAYS).    Marland Kitchen senna-docusate (SENOKOT-S) 8.6-50 MG per tablet Take 3 tablets by mouth 2 (two) times daily. (Patient taking differently: Take 2 tablets by mouth 2 (two) times daily. ) 100 tablet 1   No current facility-administered medications on file prior to visit.     Allergies  Allergen Reactions  . Tizanidine Itching  . Aspirin Nausea And Vomiting    Tolerated baby aspirin, but with more than once per day of full strength for aches and pains - had stomach upset.  No history of PUD/gastric bleeding  known.   . Statins Other (See Comments)    Myalgias and chest pain (has tried Lipitor and Pravachol)    Past Medical History:  Diagnosis Date  . Allergy   . Anxiety   . Arthritis    back  . Back pain    arthritis  . Clotting disorder ([place])   . CVA (cerebral infarction)   . Depression   . Fall 11/2014   fell in bathroom, uses a cane  . Hypercholesteremia   . Hypertension   . Joint pain    back, history of   . Stroke ([place])   . Weakness    Social History   Social History Narrative   Lives in [place]     Married. Education: [employer].    Social History   Tobacco Use  . Smoking status: Former Smoker    Years: 5.00  . Smokeless tobacco: Never Used  . Tobacco comment: quit smoking 40 years ago  Substance Use Topics  . Alcohol use: Yes    Alcohol/week: 1.0 standard drinks    Types: 1 Glasses of wine per week    Comment: occasionally wine  . Drug use: No   family history includes Breast cancer in her daughter; Coronary artery disease (age of onset: 29) in her mother; Kidney disease in her father; Stomach cancer in her other.     Objective:  BP 110/78   Pulse 96   Temp 98.6 F (37 C) (Oral)   Resp 18   Ht 5' (1.524 m)   SpO2 99%   BMI 41.40 kg/m  Body mass index is 41.4 kg/m.  Wt Readings from Last 3 Encounters:  02/26/18 212 lb (96.2 kg)  01/08/18 212 lb (96.2 kg)  11/24/17 212 lb (96.2 kg)    Physical Exam  Constitutional: She is oriented to person, place, and time. She appears well-developed and well-nourished.  HENT:  Head: Normocephalic and atraumatic.  Right Ear: Hearing and external ear normal.  Left Ear: Hearing and external ear normal.  Eyes: Conjunctivae are normal.  Neck: Normal range of motion.  Cardiovascular: Normal rate, regular rhythm and normal heart sounds.  No murmur heard. Pulmonary/Chest: Effort normal and breath sounds normal.  Musculoskeletal:       Right lower leg: She exhibits no edema.       Left lower leg: She exhibits no edema.  No use of left arm/leg- in wheelchair  Neurological: She is alert and oriented to person, place, and time.  Skin: Skin is warm and dry.  Psychiatric: She has a normal mood and affect. Her behavior is normal. Judgment and thought content normal.  Vitals reviewed.   Assessment and Plan :  Essential hypertension - Plan: metoprolol succinate (TOPROL-XL) 25 MG 24 hr tablet - well controlled  Hyperlipidemia, unspecified hyperlipidemia type - Plan: Lipid panel - encouraged at least red yeast rice - she will think  about Zetia  Thrombotic stroke involving right middle cerebral artery ([place]) - 4 years ago - having no symptoms - has not seen cardiologist in a  Flu vaccine need - Plan: Flu vaccine HIGH DOSE PF (Fluzone High dose) - given today  Patient verbalized to me that they understand the following: diagnosis, what is being done for them, what to expect and what should be done at home.  Their questions have been answered regarding flying and her medical conditions - recommendations were given.  See after visit summary for patient specific instructions.  Windell Hummingbird PA-C  [facility] at [facility]  [facility] 03/25/2018 3:56 PM  Please note: Portions of this report may have been transcribed using dragon voice recognition software. Every effort was made to ensure accuracy; however, inadvertent computerized transcription errors may be present.

## 2018-03-25 NOTE — Patient Instructions (Addendum)
  Look for hospital system through insurance - then look for press ganey scores for the independent providers.  Red yeast rice - to help with cholesterol  1 week before flight -- 325mg  ASA Move legs during flight Consider compression socks with the flight. Make sure you stay good and hydrated.  If you have lab work done today you will be contacted with your lab results within the next 2 weeks.  If you have not heard from [place] then please contact us. The fastest way to get your results is to register for My Chart.   IF you received an x-ray today, you will receive an invoice from [facility]. Please contact [facility] at [phone] with questions or concerns regarding your invoice.   IF you received labwork today, you will receive an invoice from [facility]. Please contact [facility] at [phone] with questions or concerns regarding your invoice.   Our billing staff will not be able to assist you with questions regarding bills from these companies.  You will be contacted with the lab results as soon as they are available. The fastest way to get your results is to activate your My Chart account. Instructions are located on the last page of this paperwork. If you have not heard from [place] regarding the results in 2 weeks, please contact this office.

## 2018-03-26 LAB — LIPID PANEL
CHOLESTEROL TOTAL: 263 mg/dL — AB (ref 100–199)
Chol/HDL Ratio: 4.8 ratio — ABNORMAL HIGH (ref 0.0–4.4)
HDL: 55 mg/dL (ref >39)
LDL CALC: 180 mg/dL — AB (ref 0–99)
Triglycerides: 138 mg/dL (ref 0–149)
VLDL Cholesterol Cal: 28 mg/dL (ref 5–40)

## 2018-04-08 ENCOUNTER — Ambulatory Visit: Payer: Medicare Other | Admitting: Family Medicine

## 2018-04-09 ENCOUNTER — Ambulatory Visit (HOSPITAL_BASED_OUTPATIENT_CLINIC_OR_DEPARTMENT_OTHER): Payer: Medicare Other | Admitting: Physical Medicine & Rehabilitation

## 2018-04-09 ENCOUNTER — Encounter: Payer: Medicare Other | Attending: Physical Medicine & Rehabilitation

## 2018-04-09 ENCOUNTER — Encounter: Payer: Self-pay | Admitting: Physical Medicine & Rehabilitation

## 2018-04-09 VITALS — BP 134/79 | HR 94 | Resp 14

## 2018-04-09 DIAGNOSIS — G8110 Spastic hemiplegia affecting unspecified side: Secondary | ICD-10-CM

## 2018-04-09 DIAGNOSIS — R252 Cramp and spasm: Secondary | ICD-10-CM | POA: Insufficient documentation

## 2018-04-09 DIAGNOSIS — R6889 Other general symptoms and signs: Secondary | ICD-10-CM | POA: Diagnosis not present

## 2018-04-09 NOTE — Progress Notes (Signed)
Botox Injection for spasticity using needle EMG guidance  Dilution: 50 Units/ml Indication: Severe spasticity which interferes with ADL,mobility and/or  hygiene and is unresponsive to medication management and other conservative care Informed consent was obtained after describing risks and benefits of the procedure with the patient. This includes bleeding, bruising, infection, excessive weakness, or medication side effects. A REMS form is on file and signed. Needle: 25g 2" needle electrode Number of units per muscle Pectoralis100 Brachioradialis50 Biceps 50 FDS50 FDP50 FPL50 PT 25 PQ 25 All injections were done after obtaining appropriate EMG activity and after negative drawback for blood. The patient tolerated the procedure well. Post procedure instructions were given. A followup appointment was made.  Based on EMG activity would focus pec tx on pec/delt junction

## 2018-04-09 NOTE — Patient Instructions (Signed)

## 2018-04-15 ENCOUNTER — Other Ambulatory Visit: Payer: Self-pay

## 2018-04-15 ENCOUNTER — Encounter: Payer: Self-pay | Admitting: *Deleted

## 2018-04-15 ENCOUNTER — Ambulatory Visit: Payer: Medicare Other | Attending: Physical Medicine & Rehabilitation | Admitting: *Deleted

## 2018-04-15 DIAGNOSIS — M79642 Pain in left hand: Secondary | ICD-10-CM

## 2018-04-15 DIAGNOSIS — I69354 Hemiplegia and hemiparesis following cerebral infarction affecting left non-dominant side: Secondary | ICD-10-CM

## 2018-04-15 DIAGNOSIS — M6281 Muscle weakness (generalized): Secondary | ICD-10-CM | POA: Diagnosis not present

## 2018-04-15 NOTE — Therapy (Signed)
[facility] [address] [place], [place], [address] Phone: [phone]   Fax:  [phone]  Occupational Therapy Evaluation  Patient Details  Name: Mary Le MRN: [id number] Date of Birth: 06/24/1946 Referring Provider: Dr Letta Pate   Encounter Date: 04/15/2018  OT End of Session - 04/15/18 1043    Visit Number  1    Number of Visits  8   1x/wk for 8 wks, may d/c after 4 visits however depending on pt progress   Authorization Type  Medicare & [employer] medicare supplement    OT Start Time  0935    OT Stop Time  1015    OT Time Calculation (min)  40 min    Activity Tolerance  Patient tolerated treatment well    Behavior During Therapy  [facility] for tasks assessed/performed       Past Medical History:  Diagnosis Date  . Allergy   . Anxiety   . Arthritis    back  . Back pain    arthritis  . Clotting disorder ([place])   . CVA (cerebral infarction)   . Depression   . Fall 11/2014   fell in bathroom, uses a cane  . Hypercholesteremia   . Hypertension   . Joint pain    back, history of   . Stroke ([place])   . Weakness     Past Surgical History:  Procedure Laterality Date  . APPENDECTOMY    . HEMORROIDECTOMY    . IR RADIOLOGY PERIPHERAL GUIDED IV START  06/18/2017  . IR US GUIDE VASC ACCESS RIGHT  06/18/2017  . LOOP RECORDER IMPLANT  06/07/2014   MDT LINQ implanted by Dr [name] for cryptogenic stroke  . LOOP RECORDER IMPLANT N/A 06/07/2014   Procedure: LOOP RECORDER IMPLANT;  Surgeon: [name], MD;  Location: [facility] CATH LAB;  Service: Cardiovascular;  Laterality: N/A;  . RADIOLOGY WITH ANESTHESIA N/A 06/03/2014   Procedure: RADIOLOGY WITH ANESTHESIA;  Surgeon: [name], MD;  Location: [facility];  Service: Radiology;  Laterality: N/A;  . TEE WITHOUT CARDIOVERSION N/A 06/07/2014   Procedure: TRANSESOPHAGEAL ECHOCARDIOGRAM (TEE);  Surgeon: [name], MD;  Location: [facility] ENDOSCOPY;  Service:  Cardiovascular;  Laterality: N/A;  . TONSILLECTOMY    . TUBAL LIGATION      There were no vitals filed for this visit.  Subjective Assessment - 04/15/18 0944    Subjective   Pt reports that she had botox injection about 1 week ago from Dr Tasia Catchings and she presents today for eval for wrist/hand orthosis.    Patient is accompained by:  Family member   Daughter   Patient Stated Goals  Better positioning left hand "b/x it sometimes just hangs"    Currently in Pain?  No/denies    Pain Score  0-No pain    Multiple Pain Sites  No        [facility] OT Assessment - 04/15/18 0001      Assessment   Medical Diagnosis  Spastic hemiplegia L UE    Referring Provider  Dr Letta Pate    Onset Date/Surgical Date  --   Nov 2015   Hand Dominance  Right    Next MD Visit  May 18, 2018    Prior Therapy  Yes      Precautions   Precautions  Fall      Balance Screen   Has the patient fallen in the past 6 months  Yes    How many  times?  1 time, a bug jumped out and pt jumped back and fell trying to get away.   ~3 months ago   Has the patient had a decrease in activity level because of a fear of falling?   No    Is the patient reluctant to leave their home because of a fear of falling?   No      Home  Environment   Family/patient expects to be discharged to:  Private residence    Lives With  Family   Daughter, husband and Curator     Prior Function   Level of Independence  Independent    Vocation  Retired    Chief Technology Officer, crossword, TV, movies      ADL   ADL comments  Pt does own bathing, some assist for drying and dressing. Anything that requires her to pull up (tops and bottoms per pt report). Pt family does shopping, finances, meal prep and housekeeping.   Pt has had PRN family assist since 2015. No recent changes     IADL   Shopping  --   Family has done shopping since 2015   Light Housekeeping  Does not participate in any housekeeping tasks   Family has done housekeeping since  2015   Meal Prep  --   Family/daughter does all meal prep since 2015   Community Mobility  Relies on family or friends for transportation   Uses manual w/c for appointments, has [place] for home   Medication Management  --   Family prepares all medications since 2015   Financial Management  --   Family has done all finances since 2015     Mobility   Mobility Status  --   Uses cane, manual w/c for distance     Written Expression   Dominant Hand  Right      Vision - History   Baseline Vision  Wears glasses all the time      Cognition   Overall Cognitive Status  Within Functional Limits for tasks assessed      Sensation   Light Touch  Appears Intact    Hot/Cold  Appears Intact      Coordination   Gross Motor Movements are Fluid and Coordinated  --   Flaccid since CVA 2015   Fine Motor Movements are Fluid and Coordinated  --   None, flaccid since 2015 CVA     Tone   Assessment Location  Left Upper Extremity      ROM / Strength   AROM / PROM / Strength  AROM;PROM      AROM   Overall AROM   Deficits   No A/ROM LUE     PROM   Overall PROM   Deficits   Impaired P/ROM LUE   Overall PROM Comments  Impaired PROM noted L UE, pt with ~30* elbow extension, shoulder flexion limited to ~60*; Digits are able to be placed in functional resting position, however pt reports pain with finger extension beyond resting position of slight flexion.    PROM Assessment Site  Elbow;Wrist;Finger;Shoulder    Right/Left Shoulder  Left    Right/Left Elbow  Left    Right/Left Wrist  Left    Right/Left Finger  Left      LUE Tone   LUE Tone  --   Spastic hemiplegia since 2015, No AROM              OT Treatments/Exercises (OP) - 04/15/18 0001  Splinting   Splinting  Pt presents to clinic today per Dr Letta Pate for OT Eval wrist/hand orthosis left. Pt has had resting hand splint in the past and reports that it was difficult to take on/off. Discussed possible fabrication or resting  hand splint left vs trying a prefab palm protector for better positioning of her hand as pt c/o fingers "dig into my palm and I cut myself" with my fingernails. Pt and pt daughter elceted to try palm protector at this time which we will have the opportunity to serially adjust as tolerated as pt has had recent botox injections. Pt was fitted with a left small palm protector that was adapted to include a tan piece of cylindrical foam to increase gentle resting hand/finger position. Splint use, care and precautions were reviewed with pt and pt's daughter today in clinic. It was also discussed that pt may benefit from treatment 1x/week x4 weeks for home program, gentle stretching/PROM to L UE to assist with better positoning for family to be able to assist with ADL's. Pt and pt's daughter were agreeable to this.             OT Education - 04/15/18 1042    Education Details  Splint evaluation and findings, recommendations. Splint use, care and precautions L.    Person(s) Educated  Patient;Child(ren)   Adult Daughter   Methods  Explanation;Demonstration;Verbal cues    Comprehension  Verbalized understanding       OT Short Term Goals - 04/15/18 1058      OT SHORT TERM GOAL #1   Title  Pt/family will be Mod I LUE splinting use, care and precautions.    Baseline  needs vc's, min A    Time  4    Period  Weeks    Status  New    Target Date  05/13/18                                   OT Long Term Goals - 04/15/18 1101      OT LONG TERM GOAL #1   Title  Pt and dtr will verbalize understanding of importance of resuming and maintaining HEP for stretching of LUE.    Baseline  per pt report    Time  8    Period  Weeks    Status  New  Target date:06/10/18                                      Plan - 04/15/18 1045    Clinical Impression Statement  Pt is a 72 y/o R HD female whom has been seen in out-pt neuro rehab in the past. She has a dx: spastic hemiplegia affecting  her LUE following a CVA in 2015. She presents today after seeing Dr Letta Pate for botox injections to her LUE for OT eval for wrist/hand orthosis. Pt reports that her fingers often "dig into" her hand and her fingernails can cut her at times. After her assessment and a lengthy discussion of the possiblity of fabricating a resting hand splint, pt was ultimately fitted with a palm protector that had been adapted to include a piece of tan cylindrical foam to increase digital extension and a functional resting position of her hand. This splint should assist with functional positioning and thereby ADL's and self care tasks. Pt  also has deficits with her family's ability to assist with ADL's due to spasticity of her LUE and she may benefit from serial adjustemtn of this splint and a basic home program for LUE stretching/P/ROM to decresae burden of care.    Occupational Profile and client history currently impacting functional performance  Pt has had resting hand splint in the past. Pt has had OT for LUE in the past    Occupational performance deficits (Please refer to evaluation for details):  ADL's    Rehab Potential  Fair    Current Impairments/barriers affecting progress:  Pt has had extensive out-pt therapy in the past    OT Frequency  1x / week   1x/week x8 weeks however may d/c after 4 visits depending on pt progress   OT Duration  8 weeks    OT Treatment/Interventions  Manual Therapy;Passive range of motion;Patient/family education;Splinting;Therapeutic exercise;Self-care/ADL training    Plan  Splint check and adjustment PRN LUE. Instruct in home program for LUE stretching/PROM to assist with family's ease of ADL's/dressing    Consulted and Agree with Plan of Care  Patient;Family member/caregiver    Family Member Consulted  Adult daughter present throughout session.       Patient will benefit from skilled therapeutic intervention in order to improve the following deficits and impairments:  Decreased  coordination, Impaired UE functional use, Decreased range of motion, Impaired tone, Decreased mobility  Visit Diagnosis: Hemiplegia and hemiparesis following cerebral infarction affecting left non-dominant side ([place]) - Plan: Ot plan of care cert/re-cert  Muscle weakness (generalized) - Plan: Ot plan of care cert/re-cert  Pain in left hand - Plan: Ot plan of care cert/re-cert    Problem List Patient Active Problem List   Diagnosis Date Noted  . HTN (hypertension) 07/26/2015  . Obesity (BMI 30.0-34.9) 07/26/2015  . Syncope 12/06/2014  . Spastic hemiplegia affecting nondominant side ([place]) 07/25/2014  . 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 Left hemiparesis ([place]) 06/10/2014  . Left-sided neglect 06/10/2014  . 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 Acute respiratory failure with hypoxia ([place]) 06/05/2014  . Cerebral infarction due to occlusion of right middle cerebral artery ([place]) 06/03/2014  . Cerebral infarct ([place]) 06/03/2014  . Altered mental status 06/03/2014  . GERD (gastroesophageal reflux disease) 04/12/2014  . Dyslipidemia- statin intol 02/07/2014  . Chest pain- Low risk Myoview July 2015 02/06/2014    Almyra Deforest, OTR/L 04/15/2018, 11:14 AM  [facility] [address] [place], [place], [address] Phone: [phone]   Fax:  [phone]  Name: Mary Le MRN: [id number] Date of Birth: 05-27-1946

## 2018-04-21 ENCOUNTER — Ambulatory Visit: Payer: Medicare Other | Attending: Physical Medicine & Rehabilitation | Admitting: Occupational Therapy

## 2018-04-21 DIAGNOSIS — M79642 Pain in left hand: Secondary | ICD-10-CM | POA: Diagnosis not present

## 2018-04-21 DIAGNOSIS — I69354 Hemiplegia and hemiparesis following cerebral infarction affecting left non-dominant side: Secondary | ICD-10-CM | POA: Diagnosis not present

## 2018-04-21 DIAGNOSIS — M6281 Muscle weakness (generalized): Secondary | ICD-10-CM | POA: Diagnosis not present

## 2018-04-21 DIAGNOSIS — R2689 Other abnormalities of gait and mobility: Secondary | ICD-10-CM | POA: Insufficient documentation

## 2018-04-21 DIAGNOSIS — R2681 Unsteadiness on feet: Secondary | ICD-10-CM | POA: Insufficient documentation

## 2018-04-21 NOTE — Therapy (Signed)
[facility] [address] [place], [place], [address] Phone: [phone]   Fax:  [phone]  Occupational Therapy Treatment  Patient Details  Name: Mary Le MRN: [id number] Date of Birth: [birth year] No data recorded  Encounter Date: 04/21/2018  OT End of Session - 04/21/18 1307    Visit Number  2    Number of Visits  8   only 4 visits anticipated   Authorization Type  Medicare & [employer] medicare supplement    OT Start Time  1150    OT Stop Time  1240    OT Time Calculation (min)  50 min    Activity Tolerance  Patient tolerated treatment well    Behavior During Therapy  [facility] for tasks assessed/performed       Past Medical History:  Diagnosis Date  . Allergy   . Anxiety   . Arthritis    back  . Back pain    arthritis  . Clotting disorder ([place])   . CVA (cerebral infarction)   . Depression   . Fall 11/2014   fell in bathroom, uses a cane  . Hypercholesteremia   . Hypertension   . Joint pain    back, history of   . Stroke ([place])   . Weakness     Past Surgical History:  Procedure Laterality Date  . APPENDECTOMY    . HEMORROIDECTOMY    . IR RADIOLOGY PERIPHERAL GUIDED IV START  06/18/2017  . IR US GUIDE VASC ACCESS RIGHT  06/18/2017  . LOOP RECORDER IMPLANT  06/07/2014   MDT LINQ implanted by Dr [name] for cryptogenic stroke  . LOOP RECORDER IMPLANT N/A 06/07/2014   Procedure: LOOP RECORDER IMPLANT;  Surgeon: [name], MD;  Location: [facility] CATH LAB;  Service: Cardiovascular;  Laterality: N/A;  . RADIOLOGY WITH ANESTHESIA N/A 06/03/2014   Procedure: RADIOLOGY WITH ANESTHESIA;  Surgeon: [name], MD;  Location: [facility];  Service: Radiology;  Laterality: N/A;  . TEE WITHOUT CARDIOVERSION N/A 06/07/2014   Procedure: TRANSESOPHAGEAL ECHOCARDIOGRAM (TEE);  Surgeon: [name], MD;  Location: [facility] ENDOSCOPY;  Service: Cardiovascular;  Laterality: N/A;  . TONSILLECTOMY    . TUBAL  LIGATION      There were no vitals filed for this visit.  Subjective Assessment - 04/21/18 1156    Patient is accompained by:  Family member    Patient Stated Goals  Better positioning left hand "b/x it sometimes just hangs"    Currently in Pain?  Yes    Pain Location  Hand    Pain Orientation  Left    Pain Descriptors / Indicators  Aching    Pain Frequency  Intermittent    Aggravating Factors   only w/ certain movements (supination)    Pain Relieving Factors  resting position                   OT Treatments/Exercises (OP) - 04/21/18 0001      Splinting   Splinting  Pt reports palm protector working well. Pt brought in previously issued resting hand splint however strapping needed to be adjusted and noted crack in splint at web space, therefore fabricated and fitted new resting hand splint to support wrist and hand. Pt instructed to gradually build up tolerance for resting hand splint and can wear palm protector between prn. Issued splint and reviewed wear and care w/ pt/family             OT  Education - 04/21/18 1238    Education Details  splint wear and care    Person(s) Educated  Patient   granddaughter   Methods  Explanation;Demonstration;Handout    Comprehension  Verbalized understanding       OT Short Term Goals - 04/21/18 1308      OT SHORT TERM GOAL #1   Title  Pt/family will be Mod I LUE splinting use, care and precautions.    Baseline  needs vc's, min A    Time  4    Period  Weeks    Status  On-going      OT SHORT TERM GOAL #2   Title  ----------------------------------------------------------------------------------------------------      OT SHORT TERM GOAL #3   Title  -----------------------------------------------------------------------------------------      OT SHORT TERM GOAL #4   Title  --------------------------------------------------------------------------------------------        OT Long Term Goals - 04/15/18 1101       OT LONG TERM GOAL #1   Title  Pt and dtr will verbalize understanding of importance of resuming and maintaining HEP for stretching of LUE.    Baseline  per pt report    Time  8    Period  Weeks    Status  New      OT LONG TERM GOAL #2   Title  ---------------------------------------------------------------------------------------------------------      OT LONG TERM GOAL #3   Title  ----------------------------------------------------------------------------------------------------------------------      OT LONG TERM GOAL #4   Title  ---------------------------------------------------------------------------------------------            Plan - 04/21/18 1308    Clinical Impression Statement  Pt approximating STG.     Occupational Profile and client history currently impacting functional performance  Pt has had resting hand splint in the past. Pt has had OT for LUE in the past    Occupational performance deficits (Please refer to evaluation for details):  ADL's    Rehab Potential  Fair    Current Impairments/barriers affecting progress:  Pt has had extensive out-pt therapy in the past    OT Frequency  1x / week    OT Duration  4 weeks    OT Treatment/Interventions  Manual Therapy;Passive range of motion;Patient/family education;Splinting;Therapeutic exercise;Self-care/ADL training    Plan  splint adjustments prn, issue HEP    Consulted and Agree with Plan of Care  Patient;Family member/caregiver    Family Member Consulted  Adult granddaughter       Patient will benefit from skilled therapeutic intervention in order to improve the following deficits and impairments:  Decreased coordination, Impaired UE functional use, Decreased range of motion, Impaired tone, Decreased mobility  Visit Diagnosis: Hemiplegia and hemiparesis following cerebral infarction affecting left non-dominant side ([facility])  Muscle weakness (generalized)  Pain in left hand    Problem List Patient Active  Problem List   Diagnosis Date Noted  . HTN (hypertension) 07/26/2015  . Obesity (BMI 30.0-34.9) 07/26/2015  . Syncope 12/06/2014  . Spastic hemiplegia affecting nondominant side ([place]) 07/25/2014  . 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 Left hemiparesis ([place]) 06/10/2014  . Left-sided neglect 06/10/2014  . 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 Acute respiratory failure with hypoxia ([place]) 06/05/2014  . Cerebral infarction due to occlusion of right middle cerebral artery ([place]) 06/03/2014  . Cerebral infarct ([place]) 06/03/2014  . Altered mental status 06/03/2014  . GERD (gastroesophageal reflux disease) 04/12/2014  . Dyslipidemia- statin intol  02/07/2014  . Chest pain- Low risk Myoview July 2015 02/06/2014    Carey Bullocks, OTR/L 04/21/2018, 1:10 PM  [facility] [address] [place], [place], [address] Phone: [phone]   Fax:  [phone]  Name: Mary Le MRN: [id number] Date of Birth: [birth year]

## 2018-04-21 NOTE — Patient Instructions (Signed)
    Your Splint This splint should initially be fitted by a healthcare practitioner.  The healthcare practitioner is responsible for providing wearing instructions and precautions to the patient, other healthcare practitioners and care provider involved in the patient's care.  This splint was custom made for you. Please read the following instructions to learn about wearing and caring for your splint.  Precautions Should your splint cause any of the following problems, remove the splint immediately and contact your therapist/physician.  Swelling  Severe Pain  Pressure Areas  Stiffness  Numbness  Do not wear your splint while operating machinery unless it has been fabricated for that purpose.  When To Wear Your Splint Where your splint according to your therapist/physician instructions. Daytime for 2 hours, then increase by an hour each time you wear it. Try to gradually build up tolerance during the day before trying to wear at night.   Care and Cleaning of Your Splint 1. Keep your splint away from open flames. 2. Your splint will lose its shape in temperatures over 135 degrees Farenheit, ( in car windows, near radiators, ovens or in hot water).  Never make any adjustments to your splint, if the splint needs adjusting remove it and make an appointment to see your therapist. 3. Your splint may be cleaned with rubbing alcohol.   Do not immerse in hot water over 135 degrees Farenheit.

## 2018-04-27 ENCOUNTER — Encounter: Payer: Self-pay | Admitting: Registered Nurse

## 2018-04-27 ENCOUNTER — Telehealth: Payer: Self-pay | Admitting: Physical Medicine & Rehabilitation

## 2018-04-27 ENCOUNTER — Encounter: Payer: Medicare Other | Attending: Physical Medicine & Rehabilitation | Admitting: Registered Nurse

## 2018-04-27 VITALS — BP 152/77 | HR 87

## 2018-04-27 DIAGNOSIS — R252 Cramp and spasm: Secondary | ICD-10-CM | POA: Insufficient documentation

## 2018-04-27 DIAGNOSIS — R6889 Other general symptoms and signs: Secondary | ICD-10-CM | POA: Diagnosis not present

## 2018-04-27 DIAGNOSIS — W19XXXA Unspecified fall, initial encounter: Secondary | ICD-10-CM

## 2018-04-27 DIAGNOSIS — Y92009 Unspecified place in unspecified non-institutional (private) residence as the place of occurrence of the external cause: Secondary | ICD-10-CM

## 2018-04-27 DIAGNOSIS — I63411 Cerebral infarction due to embolism of right middle cerebral artery: Secondary | ICD-10-CM | POA: Diagnosis not present

## 2018-04-27 DIAGNOSIS — G8110 Spastic hemiplegia affecting unspecified side: Secondary | ICD-10-CM

## 2018-04-27 NOTE — Telephone Encounter (Signed)
Daughter called to say patient had fell on October 3. She stated she has not seen a doctor and is very sore. Please call daughter [name].

## 2018-04-27 NOTE — Telephone Encounter (Signed)
Return Mary Le call, left a voicemail, she was instructed to call Ms. Merlyn Albert PCP for evaluation post fall or urgent care or ED evaluation. Also left message regarding wheelchair evaluation has to be face to face with patient. Will await a return call.

## 2018-04-27 NOTE — Progress Notes (Signed)
Subjective:    Patient ID: Mary Le, female    DOB: 08-May-1946, 72 y.o.   MRN: [id number]  HPI: [name] is a 72 year old female who is here for  a Face to [employer]. She states she has pain in her left arm and left lower extremity. She rates her pain 4. Her current exercise regime is walking short distances, also reports she'sunable to walk long distances due to increase intensity of lower back pain and she tires out easily.   Ms. Camp reports a few days ago she was trying to walk up the stairs  with her cane in her home  she reports she lost her balanced and fell forward. Her family called EMS and they picked her up. With her recent fall and unsteady gait, this provider has place a order for a  referral for the Physical Therapist to perform a wheelchair evaluation with the recommendation of a Power Wheelchair.   Due to her recent fall while using her cane she is at risk for falls, also believe if she used a walker she will remain a fall risk due to her left arm paralysis.   Ms. Fettes will be referred to Physical Therapy for a wheelchair evaluation, she verbalizes understanding.   Daughter in room, all questions answered.   Pain Inventory Average Pain 5 Pain Right Now 4 My pain is aching  In the last 24 hours, has pain interfered with the following? General activity 4 Relation with others 4 Enjoyment of life 4 What TIME of day is your pain at its worst? varies Sleep (in general) Good  Pain is worse with: n/a Pain improves with: n/a Relief from Meds: n/a  Mobility use a cane use a wheelchair  Function retired  Neuro/Psych depression  Prior Studies Any changes since last visit?  no  Physicians involved in your care Any changes since last visit?  no   Family History  Problem Relation Age of Onset  . Coronary artery disease Mother 44       MI  . Kidney disease Father   . Breast cancer Daughter   . Stomach cancer Other   . Colon  cancer Neg Hx   . Liver cancer Neg Hx   . Colon polyps Neg Hx   . Esophageal cancer Neg Hx   . Rectal cancer Neg Hx    Social History   Socioeconomic History  . Marital status: Married    Spouse name: Not on file  . Number of children: 1  . Years of education: Not on file  . Highest education level: Not on file  Occupational History  . Occupation: RETIRED  Social Needs  . Financial resource strain: Not on file  . Food insecurity:    Worry: Not on file    Inability: Not on file  . Transportation needs:    Medical: Not on file    Non-medical: Not on file  Tobacco Use  . Smoking status: Former Smoker    Years: 5.00  . Smokeless tobacco: Never Used  . Tobacco comment: quit smoking 40 years ago  Substance and Sexual Activity  . Alcohol use: Yes    Alcohol/week: 1.0 standard drinks    Types: 1 Glasses of wine per week    Comment: occasionally wine  . Drug use: No  . Sexual activity: Not on file  Lifestyle  . Physical activity:    Days per week: Not on file  Minutes per session: Not on file  . Stress: Not on file  Relationships  . Social connections:    Talks on phone: Not on file    Gets together: Not on file    Attends religious service: Not on file    Active member of club or organization: Not on file    Attends meetings of clubs or organizations: Not on file    Relationship status: Not on file  Other Topics Concern  . Not on file  Social History Narrative   Lives in [place]    Married. Education: [employer].    Past Surgical History:  Procedure Laterality Date  . APPENDECTOMY    . HEMORROIDECTOMY    . IR RADIOLOGY PERIPHERAL GUIDED IV START  06/18/2017  . IR US GUIDE VASC ACCESS RIGHT  06/18/2017  . LOOP RECORDER IMPLANT  06/07/2014   MDT LINQ implanted by Dr [name] for cryptogenic stroke  . LOOP RECORDER IMPLANT N/A 06/07/2014   Procedure: LOOP RECORDER IMPLANT;  Surgeon: [name], MD;  Location: [facility] CATH LAB;  Service: Cardiovascular;  Laterality:  N/A;  . RADIOLOGY WITH ANESTHESIA N/A 06/03/2014   Procedure: RADIOLOGY WITH ANESTHESIA;  Surgeon: [name], MD;  Location: [facility];  Service: Radiology;  Laterality: N/A;  . TEE WITHOUT CARDIOVERSION N/A 06/07/2014   Procedure: TRANSESOPHAGEAL ECHOCARDIOGRAM (TEE);  Surgeon: [name], MD;  Location: [facility] ENDOSCOPY;  Service: Cardiovascular;  Laterality: N/A;  . TONSILLECTOMY    . TUBAL LIGATION     Past Medical History:  Diagnosis Date  . Allergy   . Anxiety   . Arthritis    back  . Back pain    arthritis  . Clotting disorder ([place])   . CVA (cerebral infarction)   . Depression   . Fall 11/2014   fell in bathroom, uses a cane  . Hypercholesteremia   . Hypertension   . Joint pain    back, history of   . Stroke ([place])   . Weakness    BP (!) 152/77   Pulse 87   SpO2 91%   Opioid Risk Score:   Fall Risk Score:  `1  Depression screen PHQ 2/9  Depression screen [facility] 2/9 03/25/2018 02/26/2018 01/08/2018 08/25/2017 01/09/2017 12/05/2016 11/22/2015  Decreased Interest 0 1 1 1  0 0 0  Down, Depressed, Hopeless 2 1 1 1  0 0 0  PHQ - 2 Score 2 2 2 2  0 0 0  Altered sleeping 0 - - - - - -  Tired, decreased energy 3 - - - - - -  Change in appetite 0 - - - - - -  Feeling bad or failure about yourself  1 - - - - - -  Trouble concentrating 0 - - - - - -  Moving slowly or fidgety/restless 0 - - - - - -  Suicidal thoughts 0 - - - - - -  PHQ-9 Score 6 - - - - - -  Some recent data might be hidden   Review of Systems  Constitutional: Negative.   HENT: Negative.   Eyes: Negative.   Respiratory: Negative.   Cardiovascular: Negative.   Gastrointestinal: Negative.   Endocrine: Negative.   Genitourinary: Negative.   Musculoskeletal: Negative.   Skin: Negative.   Allergic/Immunologic: Negative.   Neurological: Negative.   Hematological: Negative.   Psychiatric/Behavioral: Negative.   All other systems reviewed and are negative.      Objective:   Physical Exam    Constitutional: She  is oriented to person, place, and time. She appears well-developed and well-nourished.  HENT:  Head: Normocephalic and atraumatic.  Neck: Normal range of motion. Neck supple.  Cardiovascular: Normal rate and regular rhythm.  Pulmonary/Chest: Effort normal and breath sounds normal.  Musculoskeletal:  Normal Muscle Bulk and Muscle Testing Reveals: Upper Extremities: Right: Full ROM and Muscle Strength 5/5 Left Upper Extremity: Paralysis and Swelling noted to Arm and Hand Lower Extremities: Right: Full ROM and Muscle Strength 5/5 Left: Decreased ROM and Muscle Strength 4/5 Left Shin with skin abrasion Arrived in wheelchair  Neurological: She is alert and oriented to person, place, and time.  Skin: Skin is warm and dry.  Psychiatric: She has a normal mood and affect.  Nursing note and vitals reviewed.         Assessment & Plan:  1. Spastic Hemiplegia affecting Non dominant side: Continue Occupational Therapy. S/P Botox on 04/09/2018 2. Fall in Home: Educated on [employer], she verbalizes understanding. Refuse ED evaluation.   30 minutes of face to face patient care time was spent during this visit. All questions were encouraged and answered.  F/U in 4 weeks with Dr. Letta Pate

## 2018-04-28 ENCOUNTER — Encounter: Payer: Medicare Other | Admitting: Occupational Therapy

## 2018-04-28 ENCOUNTER — Ambulatory Visit: Payer: Medicare Other | Admitting: Occupational Therapy

## 2018-04-28 DIAGNOSIS — M6281 Muscle weakness (generalized): Secondary | ICD-10-CM | POA: Diagnosis not present

## 2018-04-28 DIAGNOSIS — I69354 Hemiplegia and hemiparesis following cerebral infarction affecting left non-dominant side: Secondary | ICD-10-CM | POA: Diagnosis not present

## 2018-04-28 DIAGNOSIS — M79642 Pain in left hand: Secondary | ICD-10-CM | POA: Diagnosis not present

## 2018-04-28 DIAGNOSIS — R2681 Unsteadiness on feet: Secondary | ICD-10-CM | POA: Diagnosis not present

## 2018-04-28 DIAGNOSIS — R2689 Other abnormalities of gait and mobility: Secondary | ICD-10-CM | POA: Diagnosis not present

## 2018-04-28 NOTE — Patient Instructions (Signed)
Flexion (Passive)    Use other hand to bend left elbow toward opposite shoulder, then straighten elbow as far as possible. Do NOT force this motion. Hold __5__ seconds. Repeat __10__ times. Do __2__ sessions per day.  SHOULDER: Flexion On Table    Place hands on table (Rt hand over Lt wrist), slides arms further away then back to chest.  Hold _3__ seconds. _10__ reps per set, 2 sessions per day    Flexion: ROM    Position (A) Helper: Stabilize left forearm. Grasp palm with other hand. Motion (B) -Bend wrist, moving hand in direction of palm. -Helper assists with gentle pull at the same time. CAUTION: Do not push into wrist joint. Repeat _5__ times, hold 10 seconds.  Do _2__ sessions per day.    Flexion: ROM (Supine)    Position (A) Helper: Hold left arm close to side of trunk, thumb side up. Motion (B) - Lift arm over head in line with trunk, palm turned inward. CAUTION: Do not push into shoulder joint. Do not force movement if painful. Repeat _10__ times.  Do _2__ sessions per day.

## 2018-04-28 NOTE — Therapy (Signed)
[facility] [address] [place], [place], [address] Phone: [phone]   Fax:  [phone]  Occupational Therapy Treatment  Patient Details  Name: Mary Le MRN: [id number] Date of Birth: 1946-05-11 No data recorded  Encounter Date: 04/28/2018  OT End of Session - 04/28/18 1014    Visit Number  3    Number of Visits  8    Authorization Type  Medicare & [employer] medicare supplement    OT Start Time  0930    OT Stop Time  1010    OT Time Calculation (min)  40 min    Activity Tolerance  Patient tolerated treatment well    Behavior During Therapy  [facility] for tasks assessed/performed       Past Medical History:  Diagnosis Date  . Allergy   . Anxiety   . Arthritis    back  . Back pain    arthritis  . Clotting disorder ([place])   . CVA (cerebral infarction)   . Depression   . Fall 11/2014   fell in bathroom, uses a cane  . Hypercholesteremia   . Hypertension   . Joint pain    back, history of   . Stroke ([place])   . Weakness     Past Surgical History:  Procedure Laterality Date  . APPENDECTOMY    . HEMORROIDECTOMY    . IR RADIOLOGY PERIPHERAL GUIDED IV START  06/18/2017  . IR US GUIDE VASC ACCESS RIGHT  06/18/2017  . LOOP RECORDER IMPLANT  06/07/2014   MDT LINQ implanted by Dr [name] for cryptogenic stroke  . LOOP RECORDER IMPLANT N/A 06/07/2014   Procedure: LOOP RECORDER IMPLANT;  Surgeon: [name], MD;  Location: [facility] CATH LAB;  Service: Cardiovascular;  Laterality: N/A;  . RADIOLOGY WITH ANESTHESIA N/A 06/03/2014   Procedure: RADIOLOGY WITH ANESTHESIA;  Surgeon: [name], MD;  Location: [facility];  Service: Radiology;  Laterality: N/A;  . TEE WITHOUT CARDIOVERSION N/A 06/07/2014   Procedure: TRANSESOPHAGEAL ECHOCARDIOGRAM (TEE);  Surgeon: [name], MD;  Location: [facility] ENDOSCOPY;  Service: Cardiovascular;  Laterality: N/A;  . TONSILLECTOMY    . TUBAL LIGATION      There were no  vitals filed for this visit.  Subjective Assessment - 04/28/18 0936    Subjective   I fell last Tuesday, and my leg still hurts but it's getting better    Patient is accompained by:  Family member    Patient Stated Goals  Better positioning left hand "b/x it sometimes just hangs"    Currently in Pain?  Yes    Pain Score  4     Pain Location  Leg    Pain Descriptors / Indicators  Aching;Tender    Pain Type  Acute pain    Pain Frequency  Constant    Aggravating Factors   From fall    Pain Relieving Factors  resting, tylenol                   OT Treatments/Exercises (OP) - 04/28/18 0001      ADLs   ADL Comments  Assessed resting hand splint - fitting well, however may need to adjust to bring wrist more neutral as pt is hypermobile in wrist extension.       Exercises   Exercises  --   see pt instructions for details on HEP for neuro re-education. Therapist demo assisted passive stretch for wrist flexion. Pt return demo of  self assisted stretches, and last ex instructed caregiver on how to perform correctly in supine (therapist demo on caregiver)            OT Education - 04/28/18 1005    Education Details  HEP, review of splint wear/care    Person(s) Educated  Patient;Caregiver(s)   Granddaughter   Methods  Explanation;Demonstration;Handout    Comprehension  Verbalized understanding;Returned demonstration       OT Short Term Goals - 04/21/18 1308      OT SHORT TERM GOAL #1   Title  Pt/family will be Mod I LUE splinting use, care and precautions.    Baseline  needs vc's, min A    Time  4    Period  Weeks    Status  On-going      OT SHORT TERM GOAL #2   Title  ----------------------------------------------------------------------------------------------------      OT SHORT TERM GOAL #3   Title  -----------------------------------------------------------------------------------------      OT SHORT TERM GOAL #4   Title   --------------------------------------------------------------------------------------------        OT Long Term Goals - 04/28/18 1015      OT LONG TERM GOAL #1   Title  Pt and dtr will verbalize understanding of importance of resuming and maintaining HEP for stretching of LUE.    Baseline  per pt report    Time  8    Period  Weeks    Status  On-going      OT LONG TERM GOAL #2   Title  ---------------------------------------------------------------------------------------------------------      OT LONG TERM GOAL #3   Title  ----------------------------------------------------------------------------------------------------------------------      OT LONG TERM GOAL #4   Title  ---------------------------------------------------------------------------------------------            Plan - 04/28/18 1015    Clinical Impression Statement  Pt progressing towards goals. Noted hypermobility in wrist extension and tight to stretch wrist in flexion.     Occupational Profile and client history currently impacting functional performance  Pt has had resting hand splint in the past. Pt has had OT for LUE in the past    Occupational performance deficits (Please refer to evaluation for details):  ADL's    Rehab Potential  Fair    Current Impairments/barriers affecting progress:  Pt has had extensive out-pt therapy in the past    OT Frequency  1x / week    OT Duration  4 weeks    OT Treatment/Interventions  Manual Therapy;Passive range of motion;Patient/family education;Splinting;Therapeutic exercise;Self-care/ADL training    Plan  practice last ex from HEP in supine, pt to bring in both palm protector and resting hand splint - will try and see if D ring (with wrist neutral) can be worn w/ palm protector and if so, issue D-ring; if not adjust resting hand splint for more neutral wrist position    Consulted and Agree with Plan of Care  Patient;Family member/caregiver    Family Member Consulted   Adult granddaughter       Patient will benefit from skilled therapeutic intervention in order to improve the following deficits and impairments:  Decreased coordination, Impaired UE functional use, Decreased range of motion, Impaired tone, Decreased mobility  Visit Diagnosis: Hemiplegia and hemiparesis following cerebral infarction affecting left non-dominant side (HCC)  Muscle weakness (generalized)  Pain in left hand    Problem List Patient Active Problem List   Diagnosis Date Noted  . HTN (hypertension) 07/26/2015  . Obesity (BMI 30.0-34.9) 07/26/2015  .  Syncope 12/06/2014  . Spastic hemiplegia affecting nondominant side ([place]) 07/25/2014  . 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 Left hemiparesis ([place]) 06/10/2014  . Left-sided neglect 06/10/2014  . Thrombotic stroke involving right middle cerebral artery ([place]) 06/08/2014  . Acute respiratory failure with hypoxia ([place]) 06/05/2014  . Cerebral infarction due to occlusion of right middle cerebral artery ([place]) 06/03/2014  . Cerebral infarct ([place]) 06/03/2014  . Altered mental status 06/03/2014  . GERD (gastroesophageal reflux disease) 04/12/2014  . Dyslipidemia- statin intol 02/07/2014  . Chest pain- Low risk Myoview July 2015 02/06/2014    Carey Bullocks, OTR/L 04/28/2018, 10:17 AM  [facility] [address] [place], [place], [address] Phone: [phone]   Fax:  [phone]  Name: Mary Le MRN: [id number] Date of Birth: [birth year]

## 2018-04-29 ENCOUNTER — Ambulatory Visit: Payer: Medicare Other | Admitting: Physical Therapy

## 2018-05-05 ENCOUNTER — Ambulatory Visit: Payer: Medicare Other | Admitting: Occupational Therapy

## 2018-05-05 ENCOUNTER — Ambulatory Visit: Payer: Medicare Other | Admitting: Physical Therapy

## 2018-05-05 DIAGNOSIS — M79642 Pain in left hand: Secondary | ICD-10-CM | POA: Diagnosis not present

## 2018-05-05 DIAGNOSIS — M6281 Muscle weakness (generalized): Secondary | ICD-10-CM | POA: Diagnosis not present

## 2018-05-05 DIAGNOSIS — R2681 Unsteadiness on feet: Secondary | ICD-10-CM

## 2018-05-05 DIAGNOSIS — R2689 Other abnormalities of gait and mobility: Secondary | ICD-10-CM | POA: Diagnosis not present

## 2018-05-05 DIAGNOSIS — I69354 Hemiplegia and hemiparesis following cerebral infarction affecting left non-dominant side: Secondary | ICD-10-CM

## 2018-05-05 NOTE — Therapy (Signed)
[facility] [address] [place], [place], [address] Phone: [phone]   Fax:  [phone]  Occupational Therapy Treatment  Patient Details  Name: Mary Le MRN: [id number] Date of Birth: Jan 16, 1946 No data recorded  Encounter Date: 05/05/2018  OT End of Session - 05/05/18 1253    Visit Number  4    Number of Visits  8    Authorization Type  Medicare & [employer] medicare supplement    OT Start Time  1150    OT Stop Time  1235    OT Time Calculation (min)  45 min    Activity Tolerance  Patient tolerated treatment well    Behavior During Therapy  [facility] for tasks assessed/performed       Past Medical History:  Diagnosis Date  . Allergy   . Anxiety   . Arthritis    back  . Back pain    arthritis  . Clotting disorder ([place])   . CVA (cerebral infarction)   . Depression   . Fall 11/2014   fell in bathroom, uses a cane  . Hypercholesteremia   . Hypertension   . Joint pain    back, history of   . Stroke ([place])   . Weakness     Past Surgical History:  Procedure Laterality Date  . APPENDECTOMY    . HEMORROIDECTOMY    . IR RADIOLOGY PERIPHERAL GUIDED IV START  06/18/2017  . IR US GUIDE VASC ACCESS RIGHT  06/18/2017  . LOOP RECORDER IMPLANT  06/07/2014   MDT LINQ implanted by Dr [name] for cryptogenic stroke  . LOOP RECORDER IMPLANT N/A 06/07/2014   Procedure: LOOP RECORDER IMPLANT;  Surgeon: [name], MD;  Location: [facility] CATH LAB;  Service: Cardiovascular;  Laterality: N/A;  . RADIOLOGY WITH ANESTHESIA N/A 06/03/2014   Procedure: RADIOLOGY WITH ANESTHESIA;  Surgeon: [name], MD;  Location: [facility];  Service: Radiology;  Laterality: N/A;  . TEE WITHOUT CARDIOVERSION N/A 06/07/2014   Procedure: TRANSESOPHAGEAL ECHOCARDIOGRAM (TEE);  Surgeon: [name], MD;  Location: [facility] ENDOSCOPY;  Service: Cardiovascular;  Laterality: N/A;  . TONSILLECTOMY    . TUBAL LIGATION      There were no  vitals filed for this visit.  Subjective Assessment - 05/05/18 1153    Subjective   I hit my Lt foot coming out of the door this morning    Patient is accompained by:  Family member   daughter   Patient Stated Goals  Better positioning left hand "b/x it sometimes just hangs"    Currently in Pain?  Yes    Pain Score  4     Pain Location  Foot    Pain Orientation  Left    Pain Descriptors / Indicators  Sore;Tender    Pain Type  Acute pain                   OT Treatments/Exercises (OP) - 05/05/18 0001      Neurological Re-education Exercises   Other Exercises 1  Reviewed HEP w/ pt's daughter and therapist demo wrist and shoulder stretch. Pt's daughter return demo of shoulder stretches in supine      Splinting   Splinting  Pt able to have D-Ring splint and palm protector on simultaneously which pt reports is more comfortable than Resting Hand splint, therefore issued D-ring splint and adjusted metal stay for neutral wrist position. Reviewed wear and care including washing instructions, proper donning/doffing to  avoid skin breakdown and to support wrist/fingers particularly when donning. Pt also issued Oval 8 finger splint to prevent thumb IP hyperextension             OT Education - 05/05/18 1252    Education Details  splint wear and care (review and for new splints issued today), review of HEP's    Person(s) Educated  Patient   adult daughter   Methods  Explanation;Demonstration    Comprehension  Verbalized understanding;Returned demonstration       OT Short Term Goals - 04/21/18 1308      OT SHORT TERM GOAL #1   Title  Pt/family will be Mod I LUE splinting use, care and precautions.    Baseline  needs vc's, min A    Time  4    Period  Weeks    Status  On-going      OT SHORT TERM GOAL #2   Title  ----------------------------------------------------------------------------------------------------      OT SHORT TERM GOAL #3   Title   -----------------------------------------------------------------------------------------      OT SHORT TERM GOAL #4   Title  --------------------------------------------------------------------------------------------        OT Long Term Goals - 04/28/18 1015      OT LONG TERM GOAL #1   Title  Pt and dtr will verbalize understanding of importance of resuming and maintaining HEP for stretching of LUE.    Baseline  per pt report    Time  8    Period  Weeks    Status  On-going      OT LONG TERM GOAL #2   Title  ---------------------------------------------------------------------------------------------------------      OT LONG TERM GOAL #3   Title  ----------------------------------------------------------------------------------------------------------------------      OT LONG TERM GOAL #4   Title  ---------------------------------------------------------------------------------------------            Plan - 05/05/18 1254    Clinical Impression Statement  Pt progressing towards goals.     Occupational Profile and client history currently impacting functional performance  Pt has had resting hand splint in the past. Pt has had OT for LUE in the past    Occupational performance deficits (Please refer to evaluation for details):  ADL's    Rehab Potential  Fair    Current Impairments/barriers affecting progress:  Pt has had extensive out-pt therapy in the past    OT Frequency  1x / week    OT Duration  4 weeks    Plan  assess how splints and Oval 8 finger splint is going, anticipate d/c next session    Consulted and Agree with Plan of Care  Patient;Family member/caregiver    Family Member Consulted  adult daughter       Patient will benefit from skilled therapeutic intervention in order to improve the following deficits and impairments:  Decreased coordination, Impaired UE functional use, Decreased range of motion, Impaired tone, Decreased mobility  Visit  Diagnosis: Hemiplegia and hemiparesis following cerebral infarction affecting left non-dominant side (HCC)  Pain in left hand    Problem List Patient Active Problem List   Diagnosis Date Noted  . HTN (hypertension) 07/26/2015  . Obesity (BMI 30.0-34.9) 07/26/2015  . Syncope 12/06/2014  . Spastic hemiplegia affecting nondominant side ([place]) 07/25/2014  . 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 Left hemiparesis ([place]) 06/10/2014  . Left-sided neglect 06/10/2014  . Thrombotic stroke involving right middle cerebral artery ([place]) 06/08/2014  . Acute respiratory failure with hypoxia ([place])  06/05/2014  . Cerebral infarction due to occlusion of right middle cerebral artery ([place]) 06/03/2014  . Cerebral infarct ([place]) 06/03/2014  . Altered mental status 06/03/2014  . GERD (gastroesophageal reflux disease) 04/12/2014  . Dyslipidemia- statin intol 02/07/2014  . Chest pain- Low risk Myoview July 2015 02/06/2014    Carey Bullocks, OTR/L 05/05/2018, 12:55 PM  [facility] [address] [place], [place], [address] Phone: [phone]   Fax:  [phone]  Name: KENDALLYN LIPPOLD MRN: [id number] Date of Birth: October 08, 1945

## 2018-05-06 NOTE — Therapy (Signed)
[facility] [address] [place], [place], [address] Phone: [phone]   Fax:  [phone]  Physical Therapy Evaluation  Patient Details  Name: Mary Le MRN: [id number] Date of Birth: September 11, 1945 Referring Provider (PT): Danella Sensing, NP   Encounter Date: 05/05/2018  PT End of Session - 05/06/18 2154    Visit Number  1    Number of Visits  1    Authorization Type  Medicare    Authorization Time Period  05-05-18 - 06-05-18    PT Start Time  1318    PT Stop Time  1431    PT Time Calculation (min)  73 min       Past Medical History:  Diagnosis Date  . Allergy   . Anxiety   . Arthritis    back  . Back pain    arthritis  . Clotting disorder ([facility])   . CVA (cerebral infarction)   . Depression   . Fall 11/2014   fell in bathroom, uses a cane  . Hypercholesteremia   . Hypertension   . Joint pain    back, history of   . Stroke ([facility])   . Weakness     Past Surgical History:  Procedure Laterality Date  . APPENDECTOMY    . HEMORROIDECTOMY    . IR RADIOLOGY PERIPHERAL GUIDED IV START  06/18/2017  . IR US GUIDE VASC ACCESS RIGHT  06/18/2017  . LOOP RECORDER IMPLANT  06/07/2014   MDT LINQ implanted by Dr [name] for cryptogenic stroke  . LOOP RECORDER IMPLANT N/A 06/07/2014   Procedure: LOOP RECORDER IMPLANT;  Surgeon: [name], MD;  Location: [facility];  Service: Cardiovascular;  Laterality: N/A;  . RADIOLOGY WITH ANESTHESIA N/A 06/03/2014   Procedure: RADIOLOGY WITH ANESTHESIA;  Surgeon: [name], MD;  Location: [facility];  Service: Radiology;  Laterality: N/A;  . TEE WITHOUT CARDIOVERSION N/A 06/07/2014   Procedure: TRANSESOPHAGEAL ECHOCARDIOGRAM (TEE);  Surgeon: [name], MD;  Location: [facility];  Service: Cardiovascular;  Laterality: N/A;  . TONSILLECTOMY    . TUBAL LIGATION      There were no vitals filed for this visit.   Subjective Assessment - 05/06/18 2149     Subjective  Pt presents to PT for wheelchair evaluation - accompanied by her daughter; states they don't want a power wheelchair because she is unable to transport it and pt would not be able to use it in her home because of lack of space    Patient is accompained by:  Family member   daughter   Patient Stated Goals  obtain manual wheelchair    Currently in Pain?  Yes    Pain Score  4     Pain Location  Foot    Pain Orientation  Left    Pain Descriptors / Indicators  Tender;Sore    Pain Type  Acute pain    Pain Onset  Today    Pain Frequency  Constant         [facility] PT Assessment - 05/06/18 0001      Assessment   Medical Diagnosis  Rt CVA with Lt spastic hemiplegia    Referring Provider (PT)  Danella Sensing, NP    Onset Date/Surgical Date  --   Nov. 2015   Hand Dominance  Right      Precautions   Precautions  Fall      Prior Function   Level of Independence  Needs assistance with  ADLs;Needs assistance with gait                Objective measurements completed on examination: See above findings.     Mobility/Seating Evaluation    PATIENT INFORMATION: Name: Jeanenne Licea DOB: [birth year]  Sex: ????? Date seen: 05-05-18 Time: 1:15 PM  Address:  ????? Physician: ????? This evaluation/justification form will serve as the LMN for the following suppliers: __________________________ Supplier: [employer] Contact Person: Felton [name], ATP Phone:  ?????   Seating Therapist: Guido Sander, PT Phone:   [phone]   Phone: [phone]    Spouse/Parent/Caregiver name: ?????  Phone number: ????? Insurance/Payer: Medicare/[employer]     Reason for Referral: ?????  Patient/Caregiver Goals: ?????  Patient was seen for face-to-face evaluation for new manual wheelchair.  Also present was ????? to discuss recommendations and wheelchair options.  Further paperwork was completed and sent to vendor.  Patient appears to qualify for power mobility device at this time per objective  findings.   MEDICAL HISTORY: Diagnosis: Primary Diagnosis: Rt CVA with Lt spastic hemiplegia  Onset: Nov. 2015 Diagnosis: ?????   '[]' Progressive Disease Relevant past and future surgeries: ?????   Height: 5'3" Weight: 220# Explain recent changes or trends in weight: ?????   History including Falls: Pt reports 1 fall within past 6 months, which occurred 2 weeks ago; resulted in scrape to left leg and soreness      HOME ENVIRONMENT: '[x]' House  '[]' Condo/town home  '[]' Apartment  '[]' Assisted Living    '[]' Lives Alone '[x]'  Lives with Others                                                                                          Hours with caregiver: 24  '[]' Home is accessible to patient           Stairs      '[]' Yes '[x]'  No     Ramp '[]' Yes '[x]' No Comments:  pt lives in a raised ranch style home with 2 step inside to different rooms/levels   COMMUNITY ADL: TRANSPORTATION: '[x]' Car    '[]' Van    '[]' Public Transportation    '[]' Adapted w/c Lift    '[]' Ambulance    '[]' Other:       '[]' Sits in wheelchair during transport  Employment/School: ????? Specific requirements pertaining to mobility ?????  Other: ?????    FUNCTIONAL/SENSORY PROCESSING SKILLS:  Handedness:   '[x]' Right     '[]' Left    '[]' NA  Comments:  ?????  Functional Processing Skills for Wheeled Mobility '[x]' Processing Skills are adequate for safe wheelchair operation  Areas of concern than may interfere with safe operation of wheelchair Description of problem   '[]'  Attention to environment      '[]' Judgment      '[]'  Hearing  '[]'  Vision or visual processing      '[]' Motor Planning  '[]'  Fluctuations in Behavior  ?????    VERBAL COMMUNICATION: '[x]' WFL receptive '[x]'  WFL expressive '[]' Understandable  '[]' Difficult to understand  '[]' non-communicative '[]'  Uses an augmented communication device  CURRENT SEATING / MOBILITY: Current Mobility Base:  '[]' None '[]' Dependent '[x]' Manual '[]' Scooter '[]' Power  Type of Control: ?????  Manufacturer:  [employer] officer, trade union:  [id number]: 72 yrs old   Current Condition of Mobility Base:  in disrepair -brakes are not working, Scientist, water quality components:  ?????  Describe posture in present seating system:  ?????      SENSATION and SKIN ISSUES: Sensation '[]' Intact  '[x]' Impaired '[]' Absent  Level of sensation: Lt side decreased for light touch sensation Pressure Relief: Able to perform effective pressure relief :    '[x]' Yes  '[]'  No Method: ???? If not, Why?: ?????  Skin Issues/Skin Integrity Current Skin Issues  '[]' Yes '[x]' No '[]' Intact '[]'  Red area'[]'  Open Area  '[]' Scar Tissue '[]' At risk from prolonged sitting Where  ?????  History of Skin Issues  '[]' Yes '[x]' No Where  ????? When  ?????  Hx of skin flap surgeries  '[]' Yes '[x]' No Where  ????? When  ?????  Limited sitting tolerance '[x]' Yes '[]' No Hours spent sitting in wheelchair daily: Pt reports she is currently not using wheelchair very much in the home - states she usually sits either in recliner, dining room chair or in the bed  Complaint of Pain:  Please describe: Pt reports pain in left foot due to hitting foot on door frame this am prior to appt; pt reports no pain ordinarily   Swelling/Edema: Edema in LUE and LLE - has been present since CVA   ADL STATUS (in reference to wheelchair use):  Indep Assist Unable Indep with Equip Not assessed Comments  Dressing ????? ????? X ????? ????? caregiver dresses pt from bed or from a chair  Eating ????? X ????? ????? ????? needs assistance with cutting foods  Toileting ????? X ????? ????? ????? uses raised commode seat; needs assistance with hygiene and clothing negotiation  Bathing ????? X ????? ????? ????? uses a shower bench - needs assistance with bathing  Grooming/Hygiene ????? ????? ????? X ????? performs in seated position  Meal Prep ????? ????? X ????? ????? ?????  IADLS ????? ????? X ????? ????? requires caregiver's assistance and currently uses manual wheelchair  Bowel Management: '[x]' Continent  '[]' Incontinent  '[]' Accidents  Comments:  ?????  Bladder Management: '[]' Continent  '[]' Incontinent  '[x]' Accidents Comments:  only has incontinence during night     WHEELCHAIR SKILLS: Manual w/c Propulsion: '[x]' UE or LE strength and endurance sufficient to participate in ADLs using manual wheelchair Arm : '[]' left '[x]' right   '[]' Both      Distance: 100'+ Foot:  '[]' left '[x]' right   '[]' Both  Operate Scooter: '[]'  Strength, hand grip, balance and transfer appropriate for use '[]' Living environment is accessible for use of scooter  Operate Power w/c:  '[]'  Std. Joystick   '[]'  Alternative Controls Indep '[]'  Assist '[]'  Dependent/unable '[]'  N/A '[]'   '[]' Safe          '[]'  Functional      Distance: ?????  Bed confined without wheelchair '[]'  Yes '[x]'  No   STRENGTH/RANGE OF MOTION:  ????? Range of Motion Strength  Shoulder ????? ?????  Elbow ????? ?????  Wrist/Hand ????? ?????  Hip ????? ?????  Knee ????? ?????  Ankle ????? ?????     MOBILITY/BALANCE:  '[]'  Patient is totally dependent for mobility  ?????    Balance Transfers Ambulation  Sitting Balance: Standing Balance: '[]'  Independent '[]'  Independent/Modified Independent  '[x]'  WFL     '[]'  WFL '[]'  Supervision '[]'  Supervision  '[]'  Uses UE for balance  '[]'  Supervision '[x]'  Min Assist '[]'  Ambulates with Assist  ?????    '[]'  Min Assist '[x]'  Min assist '[]'  Mod Assist '[x]'  Ambulates with Device:      '[]'   RW  '[]'  StW  '[x]'  Cane  '[]'  ?????  '[]'  Mod Assist '[]'  Mod assist '[]'  Max assist   '[]'  Max Assist '[]'  Max assist '[]'  Dependent '[x]'  Indep. Short Distance Only  '[]'  Unable '[]'  Unable '[]'  Lift / Sling Required Distance (in feet)  30'   '[]'  Sliding board '[]'  Unable to Ambulate (see explanation below)  Cardio Status:  '[]' Intact  '[x]'  Impaired   '[]'  NA     has loop recorder implant but is no longer  monitored   Respiratory Status:  '[x]' Intact   '[]' Impaired   '[]' NA     ?????  Orthotics/Prosthetics: has AFO - does not currently use  Comments (Address manual vs power w/c vs scooter): Power wheelchair was discussed but it was decided that  manual wheelchair would best meet her needs; home environment is more suitable and accessible for use of a manual wheelchair rather than a power wheelchair          Anterior / Posterior Obliquity Rotation-Pelvis Decr. lumbar lordosis  PELVIS    '[]'  '[]'  '[x]'   Neutral Posterior Anterior  '[x]'  '[x]'  '[]'   WFL Rt elev Lt elev  '[x]'  '[]'  '[]'   WFL Right Left                      Anterior    Anterior     '[]'  Fixed '[]'  Other '[x]'  Partly Flexible '[]'  Flexible   '[]'  Fixed '[]'  Other '[x]'  Partly Flexible  '[]'  Flexible  '[]'  Fixed '[]'  Other '[x]'  Partly Flexible  '[]'  Flexible   TRUNK  '[]'  '[x]'  '[]'   WFL ? Thoracic ? Lumbar  Kyphosis Lordosis  '[x]'  '[]'  '[]'   WFL Convex Convex  Right Left '[]' c-curve '[]' s-curve '[]' multiple  '[]'  Neutral '[]'  Left-anterior '[x]'  Right-anterior     '[]'  Fixed '[]'  Flexible '[x]'  Partly Flexible '[x]'  Other  '[]'  Fixed '[]'  Flexible '[x]'  Partly Flexible '[]'  Other  '[]'  Fixed             '[]'  Flexible '[x]'  Partly Flexible '[]'  Other    Position Windswept  ?????  HIPS          '[]'            '[x]'               '[]'    Neutral       Abduct        ADduct         '[x]'           '[]'            '[]'   Neutral Right           Left      '[]'  Fixed '[]'  Subluxed '[x]'  Partly Flexible '[]'  Dislocated '[]'  Flexible  '[]'  Fixed '[]'  Other '[]'  Partly Flexible  '[x]'  Flexible                 Foot Positioning Knee Positioning  ?????    '[x]'  WFL  '[x]' Lt '[x]' Rt '[x]'  WFL  '[x]' Lt '[x]' Rt    KNEES ROM concerns: ROM concerns:    & Dorsi-Flexed '[]' Lt '[]' Rt ?????    FEET Plantar Flexed '[]' Lt '[]' Rt      Inversion                 '[]' Lt '[]' Rt      Eversion                 '[]' Lt '[]' Rt     HEAD '[x]'  Functional '[x]'  Good Head Control  ?????  & '[]'   Flexed         '[]'  Extended '[]'  Adequate Head Control    NECK '[]'  Rotated  Lt  '[]'  Lat Flexed Lt '[]'  Rotated  Rt '[]'  Lat Flexed Rt '[]'  Limited Head Control     '[]'  Cervical Hyperextension '[]'  Absent  Head Control     SHOULDERS ELBOWS WRIST& HAND Pt wearing resting hand splint on LUE; Lt fingers will flex as Botox effects wear off      Left      Right    Left     Right    Left     Right   U/E '[]' Functional           '[x]' Functional ????? ????? '[]' Fisting             '[]' Fisting      '[]' elev   '[x]' dep      '[]' elev   '[]' dep       '[x]' pro -'[]' retract     '[]' pro  '[]' retract '[]' subluxed             '[]' subluxed           Goals for Wheelchair Mobility  '[x]'  Independence with mobility in the home with motor related ADLs (MRADLs)  '[]'  Independence with MRADLs in the community '[]'  Provide dependent mobility  '[]'  Provide recline     '[]' Provide tilt   Goals for Seating system '[x]'  Optimize pressure distribution '[x]'  Provide support needed to facilitate function or safety '[]'  Provide corrective forces to assist with maintaining or improving posture '[]'  Accommodate client's posture:   current seated postures and positions are not flexible or will not tolerate corrective forces '[]'  Client to be independent with relieving pressure in the wheelchair '[]' Enhance physiological function such as breathing, swallowing, digestion  Simulation ideas/Equipment trials:????? State why other equipment was unsuccessful:?????   MOBILITY BASE RECOMMENDATIONS and JUSTIFICATION: MOBILITY COMPONENT JUSTIFICATION  Manufacturer: [employer] Model: Catalyst 5   Size: Width 18Seat Depth 16 '[x]' provide transport from point A to B      '[x]' promote Indep mobility  '[x]' is not a safe, functional ambulator '[x]' walker or cane inadequate '[]' non-standard width/depth necessary to accommodate anatomical measurement '[]'  ?????  '[x]' Manual Mobility Base '[x]' non-functional ambulator    '[]' Scooter/POV  '[]' can safely operate  '[]' can safely transfer   '[]' has adequate trunk stability  '[]' cannot functionally propel manual w/c  '[]' Power Mobility Base  '[]' non-ambulatory  '[]' cannot functionally propel manual wheelchair  '[]'  cannot functionally and safely operate scooter/POV '[]' can safely operate and willing to  '[]' Stroller Base '[]' infant/child  '[]' unable to propel manual wheelchair '[]' allows for growth '[]' non-functional  ambulator '[]' non-functional UE '[]' Indep mobility is not a goal at this time  '[]' Tilt  '[]' Forward '[]' Backward '[]' Powered tilt  '[]' Manual tilt  '[]' change position against gravitational force on head and shoulders  '[]' change position for pressure relief/cannot weight shift '[]' transfers  '[]' management of tone '[]' rest periods '[]' control edema '[]' facilitate postural control  '[]'  ?????  '[]' Recline  '[]' Power recline on power base '[]' Manual recline on manual base  '[]' accommodate femur to back angle  '[]' bring to full recline for ADL care  '[]' change position for pressure relief/cannot weight shift '[]' rest periods '[]' repositioning for transfers or clothing/diaper /catheter changes '[]' head positioning  '[x]' Lighter weight required '[x]' self- propulsion  '[x]' lifting '[]'  ?????  '[]' Heavy Duty required '[]' user weight greater than 250# '[]' extreme tone/ over active movement '[]' broken frame on previous chair '[]'  ?????  '[x]'  Back  '[]'  Angle Adjustable '[]'  Custom molded Tension adjustable  '[x]' postural control '[]' control of tone/spasticity '[]' accommodation of range of motion '[]' UE functional control '[]'   accommodation for seating system '[]'  ????? '[]' provide lateral trunk support '[]' accommodate deformity '[x]' provide posterior trunk support '[x]' provide lumbar/sacral support '[x]' support trunk in midline '[x]' Pressure relief over spinal processes  '[x]'  Seat Cushion Axiom G '[x]' impaired sensation  '[]' decubitus ulcers present '[]' history of pressure ulceration '[]' prevent pelvic extension '[]' low maintenance  '[x]' stabilize pelvis  '[]' accommodate obliquity '[]' accommodate multiple deformity '[x]' neutralize lower extremity position '[x]' increase pressure distribution '[]'  ?????  '[]'  Pelvic/thigh support  '[]'  Lateral thigh guide '[]'  Distal medial pad  '[]'  Distal lateral pad '[]'  pelvis in neutral '[]' accommodate pelvis '[]'  position upper legs '[]'  alignment '[]'  accommodate ROM '[]'  decr adduction '[]' accommodate tone '[]' removable for transfers '[]' decr abduction  '[]'  Lateral trunk  Supports '[]'  Lt     '[]'  Rt '[]' decrease lateral trunk leaning '[]' control tone '[]' contour for increased contact '[]' safety  '[]' accommodate asymmetry '[]'  ?????  '[]'  Mounting hardware  '[]' lateral trunk supports  '[]' back   '[]' seat '[]' headrest      '[]'  thigh support '[]' fixed   '[]' swing away '[]' attach seat platform/cushion to w/c frame '[]' attach back cushion to w/c frame '[]' mount postural supports '[]' mount headrest  '[]' swing medial thigh support away '[]' swing lateral supports away for transfers  '[]'  ?????    Armrests  '[]' fixed '[x]' adjustable height '[]' removable   '[]' swing away  '[x]' flip back   '[]' reclining '[]' full length pads '[x]' desk    '[]' pads tubular  '[x]' provide support with elbow at 90   '[]' provide support for w/c tray '[x]' change of height/angles for variable activities '[]' remove for transfers '[]' allow to come closer to table top '[x]' remove for access to tables '[]'  ?????  Hangers/ Leg rests  '[]' 60 '[x]' 70 '[]' 90 '[]' elevating '[]' heavy duty  '[]' articulating '[]' fixed '[x]' lift off '[x]' swing away     '[]' power '[x]' provide LE support  '[]' accommodate to hamstring tightness '[]' elevate legs during recline   '[]' provide change in position for Legs '[x]' Maintain placement of feet on footplate '[]' durability '[x]' enable transfers '[]' decrease edema '[]' Accommodate lower leg length '[]'  ?????  Foot support Footplate    '[x]' Lt  '[x]'  Rt  '[]'  Center mount '[x]' flip up     '[x]' depth/angle adjustable '[]' Amputee adapter    '[]'  Lt     '[]'  Rt '[x]' provide foot support '[x]' accommodate to ankle ROM '[x]' transfers '[]' Provide support for residual extremity '[]'  allow foot to go under wheelchair base '[]'  decrease tone  '[]'  ?????  '[x]'  Ankle strap/heel loops '[x]' support foot on foot support '[]' decrease extraneous movement '[]' provide input to heel  '[]' protect foot  Tires: '[]' pneumatic  '[]' flat free inserts  '[x]' solid  '[x]' decrease maintenance  '[x]' prevent frequent flats '[]' increase shock absorbency '[]' decrease pain from road shock '[]' decrease spasms from road shock '[]'  ?????  '[]'  Headrest   '[]' provide posterior head support '[]' provide posterior neck support '[]' provide lateral head support '[]' provide anterior head support '[]' support during tilt and recline '[]' improve feeding   '[]' improve respiration '[]' placement of switches '[]' safety  '[]' accommodate ROM  '[]' accommodate tone '[]' improve visual orientation  '[]'  Anterior chest strap '[]'  Vest '[]'  Shoulder retractors  '[]' decrease forward movement of shoulder '[]' accommodation of TLSO '[]' decrease forward movement of trunk '[]' decrease shoulder elevation '[]' added abdominal support '[]' alignment '[]' assistance with shoulder control  '[]'  ?????  Pelvic Positioner '[x]' Belt '[]' SubASIS bar '[]' Dual Pull '[]' stabilize tone '[x]' decrease falling out of chair/ **will not Decr potential for sliding due to pelvic tilting '[]' prevent excessive rotation '[]' pad for protection over boney prominence '[]' prominence comfort '[]' special pull angle to control rotation '[]'  ?????  Upper Extremity Support '[]' L   '[]'  R '[]' Arm trough    '[]' hand support '[]'  tray       '[]' full tray '[]' swivel mount '[]' decrease edema      '[]'   decrease subluxation   '[]' control tone   '[]' placement for AAC/Computer/EADL '[]' decrease gravitational pull on shoulders '[]' provide midline positioning '[]' provide support to increase UE function '[]' provide hand support in natural position '[]' provide work surface   POWER WHEELCHAIR CONTROLS  '[]' Proportional  '[]' Non-Proportional Type ????? '[]' Left  '[]' Right '[]' provides access for controlling wheelchair   '[]' lacks motor control to operate proportional drive control '[]' unable to understand proportional controls  Actuator Control Module  '[]' Single  '[]' Multiple   '[]' Allow the client to operate the power seat function(s) through the joystick control   '[]' Safety Reset Switches '[]' Used to change modes and stop the wheelchair when driving in latch mode    '[]' Upgraded Electronics   '[]' programming for accurate control '[]' progressive Disease/changing condition '[]' non-proportional drive control needed  '[]' Needed in order to operate power seat functions through joystick control   '[]' Display box '[]' Allows user to see in which mode and drive the wheelchair is set  '[]' necessary for alternate controls    '[]' Digital interface electronics '[]' Allows w/c to operate when using alternative drive controls  '[]' ASL Head Array '[]' Allows client to operate wheelchair  through switches placed in tri-panel headrest  '[]' Sip and puff with tubing kit '[]' needed to operate sip and puff drive controls  '[]' Upgraded tracking electronics '[]' increase safety when driving '[]' correct tracking when on uneven surfaces  '[]' Mount for switches or joystick '[]' Attaches switches to w/c  '[]' Swing away for access or transfers '[]' midline for optimal placement '[]' provides for consistent access  '[]' Attendant controlled joystick plus mount '[]' safety '[]' long distance driving '[]' operation of seat functions '[]' compliance with transportation regulations '[]'  ?????    Rear wheel placement/Axle adjustability '[]' None '[]' semi adjustable '[x]' fully adjustable  '[x]' improved UE access to wheels '[]' improved stability '[x]' changing angle in space for improvement of postural stability '[]' 1-arm drive access '[]' amputee pad placement '[]'  ?????  Wheel rims/ hand rims  '[x]' metal  '[]' plastic coated '[]' oblique projections '[]' vertical projections '[]' Provide ability to propel manual wheelchair  '[]'  Increase self-propulsion with hand weakness/decreased grasp  Push handles '[]' extended  '[]' angle adjustable  '[x]' standard '[x]' caregiver access '[x]' caregiver assist '[]' allows "hooking" to enable increased ability to perform ADLs or maintain balance  One armed device  '[]' Lt   '[]' Rt '[]' enable propulsion of manual wheelchair with one arm   '[]'  ?????   Brake/wheel lock extension '[x]'  Lt   '[x]'  Rt '[]' increase indep in applying wheel locks   '[]' Side guards '[]' prevent clothing getting caught in wheel or becoming soiled '[]'  prevent skin tears/abrasions  Battery: ????? '[]' to power wheelchair ?????  Other: anti- tippers to  prevent wheelchair from tipping backwards during negotiation of curbs, inclines, etc. ?????  The above equipment has a life- long use expectancy. Growth and changes in medical and/or functional conditions would be the exceptions. This is to certify that the therapist has no financial relationship with durable medical provider or manufacturer. The therapist will not receive remuneration of any kind for the equipment recommended in this evaluation.   Patient has mobility limitation that significantly impairs safe, timely participation in one or more mobility related ADL's.  (bathing, toileting, feeding, dressing, grooming, moving from room to room)                                                             '[x]'  Yes '[]'  No Will mobility device sufficiently improve ability to participate and/or be aided in participation of MRADL's?         [  x] Yes '[]'  No Can limitation be compensated for with use of a cane or walker?                                                                                '[]'  Yes '[x]'  No Does patient or caregiver demonstrate ability/potential ability & willingness to safely use the mobility device?   '[x]'  Yes '[]'  No Does patient's home environment support use of recommended mobility device?                                                    '[x]'  Yes '[]'  No Does patient have sufficient upper extremity function necessary to functionally propel a manual wheelchair?    '[x]'  Yes '[]'  No Does patient have sufficient strength and trunk stability to safely operate a POV (scooter)?                                  '[]'  Yes '[x]'  No Does patient need additional features/benefits provided by a power wheelchair for MRADL's in the home?       '[]'  Yes '[x]'  No Does the patient demonstrate the ability to safely use a power wheelchair?                                                              '[]'  Yes '[]'  No  Therapist Name Printed: Guido Sander, PT Date: 05-05-18  Therapist's Signature:   Date:   Supplier's Name  Printed: [name] Date: 05-05-18  Supplier's Signature:   Date:  Patient/Caregiver Signature:   Date:     This is to certify that I have read this evaluation and do agree with the content within:      Physician's Name Printed: Danella Sensing, NP  Physician's Signature:  Date:     This is to certify that I, the above signed therapist have the following affiliations: '[]'  This DME provider '[]'  Manufacturer of recommended equipment '[]'  Patient's long term care facility '[x]'  None of the above                           Plan - 05/06/18 2157    Clinical Impression Statement  Pt presents with Lt hemiplegia due to Rt CVA sustained in Nov. 2015; pt unable to functionally ambulate - requires wheelchair for mobility.  Pt and daughter requesting manual wheelchair so that wheelchair may be transported outside the home and also pt states home is not accessible for use of power wheelchair.  Recommend ultralightweight manual wheelchair - [employer] 5 recommended.      History and Personal Factors relevant to plan of care:  Rt CVA in Nov. 2015    Clinical Presentation  Stable  PT Frequency  One time visit    PT Treatment/Interventions  Other (comment)   wheelchair management   PT Next Visit Plan  N/A - wheelchair eval only    Recommended Other Services  obtain ultra lightweight manual wheelchair from [place], [employer] with [facility] present for eval    Consulted and Agree with Plan of Care  Patient       Patient will benefit from skilled therapeutic intervention in order to improve the following deficits and impairments:  Abnormal gait, Impaired tone, Decreased balance, Decreased coordination, Decreased strength, Impaired UE functional use, Hypomobility, Decreased mobility  Visit Diagnosis: Hemiplegia and hemiparesis following cerebral infarction affecting left non-dominant side (HCC) - Plan: PT plan of care cert/re-cert  Other abnormalities of gait and  mobility - Plan: PT plan of care cert/re-cert  Unsteadiness on feet - Plan: PT plan of care cert/re-cert     Problem List Patient Active Problem List   Diagnosis Date Noted  . HTN (hypertension) 07/26/2015  . Obesity (BMI 30.0-34.9) 07/26/2015  . Syncope 12/06/2014  . Spastic hemiplegia affecting nondominant side ([place]) 07/25/2014  . 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 Left hemiparesis ([place]) 06/10/2014  . Left-sided neglect 06/10/2014  . 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 Acute respiratory failure with hypoxia ([place]) 06/05/2014  . Cerebral infarction due to occlusion of right middle cerebral artery ([place]) 06/03/2014  . Cerebral infarct ([place]) 06/03/2014  . Altered mental status 06/03/2014  . GERD (gastroesophageal reflux disease) 04/12/2014  . Dyslipidemia- statin intol 02/07/2014  . Chest pain- Low risk Myoview July 2015 02/06/2014    Alda Lea, PT 05/06/2018, 10:06 PM  [facility] [address] [place], [place], [address] Phone: [phone]   Fax:  [phone]  Name: DAYJAH SELMAN MRN: [id number] Date of Birth: 1946-01-24

## 2018-05-12 ENCOUNTER — Ambulatory Visit: Payer: Medicare Other | Admitting: Occupational Therapy

## 2018-05-18 ENCOUNTER — Ambulatory Visit: Payer: Medicare Other | Admitting: Physical Medicine & Rehabilitation

## 2018-05-21 ENCOUNTER — Encounter: Payer: Medicare Other | Admitting: Occupational Therapy

## 2018-05-28 ENCOUNTER — Other Ambulatory Visit (HOSPITAL_COMMUNITY): Payer: Self-pay | Admitting: Interventional Radiology

## 2018-05-28 DIAGNOSIS — I639 Cerebral infarction, unspecified: Secondary | ICD-10-CM

## 2018-06-02 ENCOUNTER — Ambulatory Visit: Payer: Medicare Other | Admitting: Physical Medicine & Rehabilitation

## 2018-06-08 ENCOUNTER — Ambulatory Visit: Payer: Medicare Other | Admitting: Physical Medicine & Rehabilitation

## 2018-06-12 ENCOUNTER — Ambulatory Visit (HOSPITAL_COMMUNITY): Payer: Medicare Other

## 2018-06-12 ENCOUNTER — Encounter (HOSPITAL_COMMUNITY): Payer: Self-pay

## 2018-06-12 ENCOUNTER — Ambulatory Visit (HOSPITAL_COMMUNITY): Admission: RE | Admit: 2018-06-12 | Payer: Medicare Other | Source: Ambulatory Visit

## 2018-06-24 ENCOUNTER — Telehealth (HOSPITAL_COMMUNITY): Payer: Self-pay

## 2018-06-24 NOTE — Telephone Encounter (Signed)
Called to reschedule cta, no answer, vm full. AW

## 2018-06-30 ENCOUNTER — Other Ambulatory Visit (HOSPITAL_COMMUNITY): Payer: Self-pay | Admitting: Interventional Radiology

## 2018-06-30 DIAGNOSIS — I639 Cerebral infarction, unspecified: Secondary | ICD-10-CM

## 2018-07-09 ENCOUNTER — Encounter (HOSPITAL_COMMUNITY): Payer: Self-pay | Admitting: Diagnostic Radiology

## 2018-07-09 ENCOUNTER — Ambulatory Visit (HOSPITAL_COMMUNITY)
Admission: RE | Admit: 2018-07-09 | Discharge: 2018-07-09 | Disposition: A | Discharge status: Home / Self Care | Payer: Medicare Other | Source: Ambulatory Visit | Attending: Interventional Radiology | Admitting: Interventional Radiology

## 2018-07-09 ENCOUNTER — Other Ambulatory Visit (HOSPITAL_COMMUNITY): Payer: Self-pay | Admitting: Interventional Radiology

## 2018-07-09 ENCOUNTER — Ambulatory Visit (HOSPITAL_COMMUNITY): Payer: Medicare Other

## 2018-07-09 DIAGNOSIS — Z452 Encounter for adjustment and management of vascular access device: Secondary | ICD-10-CM | POA: Diagnosis not present

## 2018-07-09 DIAGNOSIS — I639 Cerebral infarction, unspecified: Secondary | ICD-10-CM

## 2018-07-09 HISTORY — PX: IR US GUIDE VASC ACCESS RIGHT: IMG2390

## 2018-07-09 HISTORY — PX: IR RADIOLOGY PERIPHERAL GUIDED IV START: IMG5598

## 2018-07-09 LAB — POCT I-STAT CREATININE: Creatinine, Ser: 1 mg/dL (ref 0.44–1.00)

## 2018-07-09 MED ORDER — IOPAMIDOL (ISOVUE-300) INJECTION 61%
INTRAVENOUS | Status: AC
  Filled 2018-07-09: qty 100

## 2018-07-09 MED ORDER — LIDOCAINE HCL 1 % IJ SOLN
INTRAMUSCULAR | Status: AC
  Filled 2018-07-09: qty 20

## 2018-07-09 MED ORDER — IOPAMIDOL (ISOVUE-370) INJECTION 76%
INTRAVENOUS | Status: AC
  Filled 2018-07-09: qty 100

## 2018-07-09 MED ORDER — IOPAMIDOL (ISOVUE-370) INJECTION 76%
50.0000 mL | Freq: Once | INTRAVENOUS | Status: AC | PRN
  Administered 2018-07-09: 50 mL via INTRAVENOUS

## 2018-07-11 DIAGNOSIS — L309 Dermatitis, unspecified: Secondary | ICD-10-CM | POA: Diagnosis not present

## 2018-07-11 DIAGNOSIS — I6930 Unspecified sequelae of cerebral infarction: Secondary | ICD-10-CM | POA: Diagnosis not present

## 2018-07-11 DIAGNOSIS — L608 Other nail disorders: Secondary | ICD-10-CM | POA: Diagnosis not present

## 2018-07-11 DIAGNOSIS — Z7902 Long term (current) use of antithrombotics/antiplatelets: Secondary | ICD-10-CM | POA: Diagnosis not present

## 2018-07-11 DIAGNOSIS — M6281 Muscle weakness (generalized): Secondary | ICD-10-CM | POA: Diagnosis not present

## 2018-07-11 DIAGNOSIS — G8190 Hemiplegia, unspecified affecting unspecified side: Secondary | ICD-10-CM | POA: Diagnosis not present

## 2018-07-11 DIAGNOSIS — Z0000 Encounter for general adult medical examination without abnormal findings: Secondary | ICD-10-CM | POA: Diagnosis not present

## 2018-07-13 ENCOUNTER — Encounter (HOSPITAL_COMMUNITY): Payer: Self-pay | Admitting: Emergency Medicine

## 2018-07-13 ENCOUNTER — Other Ambulatory Visit: Payer: Self-pay

## 2018-07-13 ENCOUNTER — Emergency Department (HOSPITAL_COMMUNITY)
Admission: EM | Admit: 2018-07-13 | Discharge: 2018-07-13 | Disposition: A | Discharge status: Home / Self Care | Payer: Medicare Other | Attending: Emergency Medicine | Admitting: Emergency Medicine

## 2018-07-13 ENCOUNTER — Telehealth (HOSPITAL_COMMUNITY): Payer: Self-pay | Admitting: Radiology

## 2018-07-13 ENCOUNTER — Emergency Department (HOSPITAL_COMMUNITY): Payer: Medicare Other

## 2018-07-13 DIAGNOSIS — Z79899 Other long term (current) drug therapy: Secondary | ICD-10-CM | POA: Insufficient documentation

## 2018-07-13 DIAGNOSIS — R52 Pain, unspecified: Secondary | ICD-10-CM | POA: Diagnosis not present

## 2018-07-13 DIAGNOSIS — R51 Headache: Secondary | ICD-10-CM | POA: Diagnosis not present

## 2018-07-13 DIAGNOSIS — I10 Essential (primary) hypertension: Secondary | ICD-10-CM | POA: Insufficient documentation

## 2018-07-13 DIAGNOSIS — R519 Headache, unspecified: Secondary | ICD-10-CM

## 2018-07-13 DIAGNOSIS — R001 Bradycardia, unspecified: Secondary | ICD-10-CM | POA: Diagnosis not present

## 2018-07-13 DIAGNOSIS — Z87891 Personal history of nicotine dependence: Secondary | ICD-10-CM | POA: Diagnosis not present

## 2018-07-13 DIAGNOSIS — R1111 Vomiting without nausea: Secondary | ICD-10-CM | POA: Diagnosis not present

## 2018-07-13 LAB — CBC WITH DIFFERENTIAL/PLATELET
Abs Immature Granulocytes: 0.07 10*3/uL (ref 0.00–0.07)
Basophils Absolute: 0.1 10*3/uL (ref 0.0–0.1)
Basophils Relative: 1 %
Eosinophils Absolute: 0.1 10*3/uL (ref 0.0–0.5)
Eosinophils Relative: 1 %
HCT: 50.1 % — ABNORMAL HIGH (ref 36.0–46.0)
Hemoglobin: 15.6 g/dL — ABNORMAL HIGH (ref 12.0–15.0)
Immature Granulocytes: 1 %
LYMPHS ABS: 1.9 10*3/uL (ref 0.7–4.0)
Lymphocytes Relative: 14 %
MCH: 26.9 pg (ref 26.0–34.0)
MCHC: 31.1 g/dL (ref 30.0–36.0)
MCV: 86.5 fL (ref 80.0–100.0)
Monocytes Absolute: 0.8 10*3/uL (ref 0.1–1.0)
Monocytes Relative: 6 %
NEUTROS ABS: 10.2 10*3/uL — AB (ref 1.7–7.7)
Neutrophils Relative %: 77 %
Platelets: 313 10*3/uL (ref 150–400)
RBC: 5.79 MIL/uL — ABNORMAL HIGH (ref 3.87–5.11)
RDW: 14.4 % (ref 11.5–15.5)
WBC: 13 10*3/uL — ABNORMAL HIGH (ref 4.0–10.5)
nRBC: 0 % (ref 0.0–0.2)

## 2018-07-13 LAB — BASIC METABOLIC PANEL
Anion gap: 9 (ref 5–15)
BUN: 10 mg/dL (ref 8–23)
CO2: 24 mmol/L (ref 22–32)
Calcium: 8.8 mg/dL — ABNORMAL LOW (ref 8.9–10.3)
Chloride: 108 mmol/L (ref 98–111)
Creatinine, Ser: 0.79 mg/dL (ref 0.44–1.00)
GFR calc Af Amer: >60 mL/min (ref >60)
GFR calc non Af Amer: >60 mL/min (ref >60)
Glucose, Bld: 104 mg/dL — ABNORMAL HIGH (ref 70–99)
Potassium: 4.4 mmol/L (ref 3.5–5.1)
Sodium: 141 mmol/L (ref 135–145)

## 2018-07-13 MED ORDER — METOCLOPRAMIDE HCL 5 MG/ML IJ SOLN
10.0000 mg | Freq: Once | INTRAMUSCULAR | Status: AC
  Administered 2018-07-13: 10 mg via INTRAVENOUS

## 2018-07-13 MED ORDER — DIPHENHYDRAMINE HCL 25 MG PO CAPS
25.0000 mg | ORAL_CAPSULE | Freq: Once | ORAL | Status: AC
  Administered 2018-07-13: 25 mg via ORAL
  Filled 2018-07-13: qty 1

## 2018-07-13 MED ORDER — METOCLOPRAMIDE HCL 5 MG/ML IJ SOLN
10.0000 mg | Freq: Once | INTRAMUSCULAR | Status: DC
  Filled 2018-07-13: qty 2

## 2018-07-13 MED ORDER — DIPHENHYDRAMINE HCL 50 MG/ML IJ SOLN
12.5000 mg | Freq: Once | INTRAMUSCULAR | Status: DC
  Filled 2018-07-13: qty 1

## 2018-07-13 NOTE — ED Provider Notes (Signed)
[facility] EMERGENCY DEPARTMENT Provider Note   CSN: [id number] Arrival date & time: 07/13/18  1239     History   Chief Complaint Chief Complaint  Patient presents with  . Headache    HPI Mary Le is a 72 y.o. female.  The history is provided by the patient.    58 y o F with PMHx as below including CVA, migraine HA, HTN,  here with headache. Pt states that earlier today, she developed gradual onset of progressively worsening, generalized headache. She has been under increased stress recently and believes it is related to her stress. She states the HA is aching, throbbing, and generalized. She took her BP when the HA began and it was elevated, with SBP>180, so she took a double dose of her lisinopril (for a total of 25 mg). Since then, she has had progressive improvement in her HA. She admits to increased stressors 2/2 the holidays. No recent falls. She's been taking her meds as prescribed. Pt just had a CTAngio for monitoring and was cleared for 1 year follow-up. No new weakness or numbness. No syncope. No chest pain. She does admit to a h/o chronic headaches and has been seen in ED for same, though this is slightly worse than usual headaches.   Past Medical History:  Diagnosis Date  . Allergy   . Anxiety   . Arthritis    back  . Back pain    arthritis  . Clotting disorder ([place])   . CVA (cerebral infarction)   . Depression   . Fall 11/2014   fell in bathroom, uses a cane  . Hypercholesteremia   . Hypertension   . Joint pain    back, history of   . Stroke ([place])   . Weakness     Patient Active Problem List   Diagnosis Date Noted  . HTN (hypertension) 07/26/2015  . Obesity (BMI 30.0-34.9) 07/26/2015  . Syncope 12/06/2014  . Spastic hemiplegia affecting nondominant side ([place]) 07/25/2014  . 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 Left hemiparesis ([place]) 06/10/2014  . Left-sided neglect 06/10/2014  .  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 Acute respiratory failure with hypoxia ([place]) 06/05/2014  . Cerebral infarction due to occlusion of right middle cerebral artery ([place]) 06/03/2014  . Cerebral infarct ([place]) 06/03/2014  . Altered mental status 06/03/2014  . GERD (gastroesophageal reflux disease) 04/12/2014  . Dyslipidemia- statin intol 02/07/2014  . Chest pain- Low risk Myoview July 2015 02/06/2014    Past Surgical History:  Procedure Laterality Date  . APPENDECTOMY    . HEMORROIDECTOMY    . IR RADIOLOGY PERIPHERAL GUIDED IV START  06/18/2017  . IR RADIOLOGY PERIPHERAL GUIDED IV START  07/09/2018  . IR US GUIDE VASC ACCESS RIGHT  06/18/2017  . IR US GUIDE VASC ACCESS RIGHT  07/09/2018  . LOOP RECORDER IMPLANT  06/07/2014   MDT LINQ implanted by Dr [name] for cryptogenic stroke  . LOOP RECORDER IMPLANT N/A 06/07/2014   Procedure: LOOP RECORDER IMPLANT;  Surgeon: [name], MD;  Location: [facility] CATH LAB;  Service: Cardiovascular;  Laterality: N/A;  . RADIOLOGY WITH ANESTHESIA N/A 06/03/2014   Procedure: RADIOLOGY WITH ANESTHESIA;  Surgeon: [name], MD;  Location: [facility];  Service: Radiology;  Laterality: N/A;  . TEE WITHOUT CARDIOVERSION N/A 06/07/2014   Procedure: TRANSESOPHAGEAL ECHOCARDIOGRAM (TEE);  Surgeon: [name], MD;  Location: [facility];  Service: Cardiovascular;  Laterality:  N/A;  . TONSILLECTOMY    . TUBAL LIGATION       OB History   No obstetric history on file.      Home Medications    Prior to Admission medications   Medication Sig Start Date End Date Taking? Authorizing Provider  acetaminophen (TYLENOL) 325 MG tablet Take 650 mg by mouth every 6 (six) hours as needed (pain).    Yes [provider]  aspirin 81 MG tablet Take 81 mg by mouth daily.   Yes [provider]  cromolyn (NASALCROM) 5.2 MG/ACT nasal spray Place 1 spray into both nostrils as needed for allergies.   Yes  [provider]  famotidine (PEPCID) 10 MG tablet Take 1 tablet (10 mg total) by mouth 2 (two) times daily. Patient taking differently: Take 10 mg by mouth daily as needed for heartburn or indigestion.  06/23/14  Yes Love, [name], PA-C  loratadine (ALAVERT) 10 MG tablet Take 10 mg by mouth daily as needed for allergies.   Yes [provider]  metoprolol succinate (TOPROL-XL) 25 MG 24 hr tablet TAKE 1/2 (ONE-HALF) TABLET BY MOUTH ONCE DAILY Patient taking differently: Take 12.5 mg by mouth daily.  03/25/18  Yes Weber, Damaris Hippo, PA-C  Multiple Vitamin (MULTIVITAMIN WITH MINERALS) TABS tablet Take 1 tablet by mouth daily. Reported on 11/22/2015   Yes [provider]  Polyethyl Glycol-Propyl Glycol (SYSTANE OP) Place 1 drop into both eyes daily as needed (dry eyes).    Yes [provider]  polyethylene glycol powder (GLYCOLAX/MIRALAX) powder Take 17 g by mouth See admin instructions. AS NEEDED EVERY 3-4 DAYS FOR CONSTIPATION   Yes [provider]  senna-docusate (SENOKOT-S) 8.6-50 MG per tablet Take 3 tablets by mouth 2 (two) times daily. Patient taking differently: Take 2 tablets by mouth 2 (two) times daily as needed for mild constipation.  06/23/14  Yes Love, [name], PA-C    Family History Family History  Problem Relation Age of Onset  . Coronary artery disease Mother 104       MI  . Kidney disease Father   . Breast cancer Daughter   . Stomach cancer Other   . Colon cancer Neg Hx   . Liver cancer Neg Hx   . Colon polyps Neg Hx   . Esophageal cancer Neg Hx   . Rectal cancer Neg Hx     Social History Social History   Tobacco Use  . Smoking status: Former Smoker    Years: 5.00  . Smokeless tobacco: Never Used  . Tobacco comment: quit smoking 40 years ago  Substance Use Topics  . Alcohol use: Yes    Alcohol/week: 1.0 standard drinks    Types: 1 Glasses of wine per week    Comment: occasionally wine  . Drug use: No     Allergies     Tizanidine; Aspirin; and Statins   Review of Systems Review of Systems  Constitutional: Negative for chills, fatigue and fever.  HENT: Negative for congestion and rhinorrhea.   Eyes: Negative for visual disturbance.  Respiratory: Negative for cough, shortness of breath and wheezing.   Cardiovascular: Negative for chest pain and leg swelling.  Gastrointestinal: Negative for abdominal pain, diarrhea, nausea and vomiting.  Genitourinary: Negative for dysuria and flank pain.  Musculoskeletal: Negative for neck pain and neck stiffness.  Skin: Negative for rash and wound.  Allergic/Immunologic: Negative for immunocompromised state.  Neurological: Positive for headaches. Negative for syncope and weakness.  All other systems reviewed  and are negative.    Physical Exam Updated Vital Signs BP 118/86   Pulse 65   Temp 98.3 F (36.8 C) (Oral)   Resp 16   Ht 5\' 3"  (1.6 m)   Wt 63.5 kg   SpO2 98%   BMI 24.80 kg/m   Physical Exam Vitals signs and nursing note reviewed.  Constitutional:      General: She is not in acute distress.    Appearance: She is well-developed.  HENT:     Head: Normocephalic and atraumatic.  Eyes:     Conjunctiva/sclera: Conjunctivae normal.  Neck:     Musculoskeletal: Neck supple.  Cardiovascular:     Rate and Rhythm: Normal rate and regular rhythm.     Heart sounds: Normal heart sounds. No murmur. No friction rub.  Pulmonary:     Effort: Pulmonary effort is normal. No respiratory distress.     Breath sounds: Normal breath sounds. No wheezing or rales.  Abdominal:     General: There is no distension.     Palpations: Abdomen is soft.     Tenderness: There is no abdominal tenderness.  Skin:    General: Skin is warm.     Capillary Refill: Capillary refill takes less than 2 seconds.  Neurological:     Mental Status: She is alert and oriented to person, place, and time.    Neurological Exam:  Mental Status: Alert and oriented to person, place, and  time. Attention and concentration normal. Speech clear. Recent memory is intact. Cranial Nerves: Visual fields grossly intact. EOMI and PERRLA. No nystagmus noted. Facial sensation intact at forehead, maxillary cheek, and chin/mandible bilaterally. Left-sided facial droop, which is baseline. Hearing grossly normal. Uvula is midline, and palate elevates symmetrically. Normal SCM and trapezius strength. Tongue midline without fasciculations. Motor: Left hemiparesis with increased tone throughout left side. No tremors. RUE and RLE 5/5. Sensation: Diminished LUE and LLE, at baseline. Gait: Deferred   ED Treatments / Results  Labs (all labs ordered are listed, but only abnormal results are displayed) Labs Reviewed  CBC WITH DIFFERENTIAL/PLATELET - Abnormal; Notable for the following components:      Result Value   WBC 13.0 (*)    RBC 5.79 (*)    Hemoglobin 15.6 (*)    HCT 50.1 (*)    Neutro Abs 10.2 (*)    All other components within normal limits  BASIC METABOLIC PANEL - Abnormal; Notable for the following components:   Glucose, Bld 104 (*)    Calcium 8.8 (*)    All other components within normal limits    EKG None  Radiology Ct Head Wo Contrast  Result Date: 07/13/2018 CLINICAL DATA:  Evaluate for subarachnoid hematoma. Worsening headache. Emesis. EXAM: CT HEAD WITHOUT CONTRAST TECHNIQUE: Contiguous axial images were obtained from the base of the skull through the vertex without intravenous contrast. COMPARISON:  07/09/2018 FINDINGS: Brain: No evidence of acute infarction, hemorrhage, hydrocephalus, extra-axial collection or mass lesion/mass effect. Large area of encephalomalacia within the distribution of the right middle cerebral artery is again identified compatible with chronic infarct. There is associated ex vacuo dilatation of the right lateral ventricle. Vascular: No hyperdense vessel or unexpected calcification. Skull: Hyperostosis frontalis noted. Negative for fracture or focal  lesion. Sinuses/Orbits: No acute finding. Other: None. IMPRESSION: 1. No acute intracranial abnormalities. 2. Similar appearance of chronic right MCA infarct. Electronically Signed   By: Kerby Moors M.D.   On: 07/13/2018 15:29    Procedures Procedures (including critical care time)  Medications Ordered in ED Medications  diphenhydrAMINE (BENADRYL) capsule 25 mg (25 mg Oral Given 07/13/18 1450)  metoCLOPramide (REGLAN) injection 10 mg (10 mg Intravenous Given 07/13/18 1450)     Initial Impression / Assessment and Plan / ED Course  I have reviewed the triage vital signs and the nursing notes.  Pertinent labs & imaging results that were available during my care of the patient were reviewed by me and considered in my medical decision making (see chart for details).     72 yo F with h/o CVA, migraine HA here with generalized HA. Suspect possible migraine vs HTN urgency. She was notably hypertensive with HA onset and admits to significant recent stressors. She has a well documented h/o similar HA in this setting. Otherwise, she has no new focal neuro deficits to suggest CVA. She has been compliant with her meds. She was taken for a stat CT head which shows NAICA, which is reassuring with HA onset <6 hours ago. She just had a CT Angio of head/neck performed which was reassuring as well.   In the ED, her HA has completely resolved with migraine meds. 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 She has no fever, no other sx to suggest infectious etiology. Will encourage neuro follow-up, good return precautions, and close monitoring of BP at home.  Final Clinical Impressions(s) / ED Diagnoses   Final diagnoses:  Bad headache    ED Discharge Orders    None       Duffy Bruce, MD 07/13/18 [phone]

## 2018-07-13 NOTE — ED Triage Notes (Signed)
Pt here via [facility], reports headache since 0800 that has gotten worse throughout the day. Pt threw up x1, daughter reports systolic bp in the 033'V, pt took her 12.5 mg lisinopril and then daughter gave her another 12.5 dose.A&O x4.  Denies any weakness or vision changes.  Pt was able to walk to stretcher with EMS. Pt had her routine CT scan this past Thursday.   Left sided deficits from previous stroke in 2015, uses a walker to get around at home.

## 2018-07-13 NOTE — ED Notes (Signed)
Discharge instructions discussed with Pt. Pt verbalized understanding. Pt stable and ambulatory.    

## 2018-07-13 NOTE — Telephone Encounter (Signed)
Called pt, told her per Deveshwar she would be due for another CTA head/neck w/wo in 1 yr. Pt agrees with this plan of care. JM

## 2018-07-14 DIAGNOSIS — I6930 Unspecified sequelae of cerebral infarction: Secondary | ICD-10-CM | POA: Diagnosis not present

## 2018-07-14 DIAGNOSIS — I10 Essential (primary) hypertension: Secondary | ICD-10-CM | POA: Diagnosis not present

## 2018-07-29 DIAGNOSIS — I119 Hypertensive heart disease without heart failure: Secondary | ICD-10-CM | POA: Diagnosis not present

## 2018-07-29 DIAGNOSIS — Z6838 Body mass index (BMI) 38.0-38.9, adult: Secondary | ICD-10-CM | POA: Diagnosis not present

## 2018-07-29 DIAGNOSIS — Z9181 History of falling: Secondary | ICD-10-CM | POA: Diagnosis not present

## 2018-07-29 DIAGNOSIS — E669 Obesity, unspecified: Secondary | ICD-10-CM | POA: Diagnosis not present

## 2018-07-29 DIAGNOSIS — Z87440 Personal history of urinary (tract) infections: Secondary | ICD-10-CM | POA: Diagnosis not present

## 2018-07-29 DIAGNOSIS — Z87891 Personal history of nicotine dependence: Secondary | ICD-10-CM | POA: Diagnosis not present

## 2018-07-29 DIAGNOSIS — I69354 Hemiplegia and hemiparesis following cerebral infarction affecting left non-dominant side: Secondary | ICD-10-CM | POA: Diagnosis not present

## 2018-07-30 DIAGNOSIS — I69354 Hemiplegia and hemiparesis following cerebral infarction affecting left non-dominant side: Secondary | ICD-10-CM | POA: Diagnosis not present

## 2018-07-30 DIAGNOSIS — Z6838 Body mass index (BMI) 38.0-38.9, adult: Secondary | ICD-10-CM | POA: Diagnosis not present

## 2018-07-30 DIAGNOSIS — I119 Hypertensive heart disease without heart failure: Secondary | ICD-10-CM | POA: Diagnosis not present

## 2018-07-30 DIAGNOSIS — E669 Obesity, unspecified: Secondary | ICD-10-CM | POA: Diagnosis not present

## 2018-07-30 DIAGNOSIS — Z87891 Personal history of nicotine dependence: Secondary | ICD-10-CM | POA: Diagnosis not present

## 2018-07-30 DIAGNOSIS — Z9181 History of falling: Secondary | ICD-10-CM | POA: Diagnosis not present

## 2018-08-12 DIAGNOSIS — M2041 Other hammer toe(s) (acquired), right foot: Secondary | ICD-10-CM | POA: Diagnosis not present

## 2018-08-12 DIAGNOSIS — M79675 Pain in left toe(s): Secondary | ICD-10-CM | POA: Diagnosis not present

## 2018-08-12 DIAGNOSIS — B351 Tinea unguium: Secondary | ICD-10-CM | POA: Diagnosis not present

## 2018-08-12 DIAGNOSIS — E1151 Type 2 diabetes mellitus with diabetic peripheral angiopathy without gangrene: Secondary | ICD-10-CM | POA: Diagnosis not present

## 2018-08-13 DIAGNOSIS — Z6838 Body mass index (BMI) 38.0-38.9, adult: Secondary | ICD-10-CM | POA: Diagnosis not present

## 2018-08-13 DIAGNOSIS — E669 Obesity, unspecified: Secondary | ICD-10-CM | POA: Diagnosis not present

## 2018-08-13 DIAGNOSIS — I69354 Hemiplegia and hemiparesis following cerebral infarction affecting left non-dominant side: Secondary | ICD-10-CM | POA: Diagnosis not present

## 2018-08-13 DIAGNOSIS — Z9181 History of falling: Secondary | ICD-10-CM | POA: Diagnosis not present

## 2018-08-13 DIAGNOSIS — I119 Hypertensive heart disease without heart failure: Secondary | ICD-10-CM | POA: Diagnosis not present

## 2018-08-13 DIAGNOSIS — Z87891 Personal history of nicotine dependence: Secondary | ICD-10-CM | POA: Diagnosis not present

## 2018-08-14 DIAGNOSIS — I69354 Hemiplegia and hemiparesis following cerebral infarction affecting left non-dominant side: Secondary | ICD-10-CM | POA: Diagnosis not present

## 2018-08-14 DIAGNOSIS — E669 Obesity, unspecified: Secondary | ICD-10-CM | POA: Diagnosis not present

## 2018-08-14 DIAGNOSIS — Z6838 Body mass index (BMI) 38.0-38.9, adult: Secondary | ICD-10-CM | POA: Diagnosis not present

## 2018-08-14 DIAGNOSIS — Z87891 Personal history of nicotine dependence: Secondary | ICD-10-CM | POA: Diagnosis not present

## 2018-08-14 DIAGNOSIS — I119 Hypertensive heart disease without heart failure: Secondary | ICD-10-CM | POA: Diagnosis not present

## 2018-08-14 DIAGNOSIS — Z9181 History of falling: Secondary | ICD-10-CM | POA: Diagnosis not present

## 2018-08-15 DIAGNOSIS — I6930 Unspecified sequelae of cerebral infarction: Secondary | ICD-10-CM | POA: Diagnosis not present

## 2018-08-15 DIAGNOSIS — Z9181 History of falling: Secondary | ICD-10-CM | POA: Diagnosis not present

## 2018-08-15 DIAGNOSIS — E669 Obesity, unspecified: Secondary | ICD-10-CM | POA: Diagnosis not present

## 2018-08-15 DIAGNOSIS — G8190 Hemiplegia, unspecified affecting unspecified side: Secondary | ICD-10-CM | POA: Diagnosis not present

## 2018-08-15 DIAGNOSIS — Z87891 Personal history of nicotine dependence: Secondary | ICD-10-CM | POA: Diagnosis not present

## 2018-08-15 DIAGNOSIS — M6281 Muscle weakness (generalized): Secondary | ICD-10-CM | POA: Diagnosis not present

## 2018-08-15 DIAGNOSIS — I69354 Hemiplegia and hemiparesis following cerebral infarction affecting left non-dominant side: Secondary | ICD-10-CM | POA: Diagnosis not present

## 2018-08-15 DIAGNOSIS — I119 Hypertensive heart disease without heart failure: Secondary | ICD-10-CM | POA: Diagnosis not present

## 2018-08-15 DIAGNOSIS — I10 Essential (primary) hypertension: Secondary | ICD-10-CM | POA: Diagnosis not present

## 2018-08-15 DIAGNOSIS — Z7902 Long term (current) use of antithrombotics/antiplatelets: Secondary | ICD-10-CM | POA: Diagnosis not present

## 2018-08-15 DIAGNOSIS — Z6838 Body mass index (BMI) 38.0-38.9, adult: Secondary | ICD-10-CM | POA: Diagnosis not present

## 2018-08-17 DIAGNOSIS — Z87891 Personal history of nicotine dependence: Secondary | ICD-10-CM | POA: Diagnosis not present

## 2018-08-17 DIAGNOSIS — I69354 Hemiplegia and hemiparesis following cerebral infarction affecting left non-dominant side: Secondary | ICD-10-CM | POA: Diagnosis not present

## 2018-08-17 DIAGNOSIS — Z6838 Body mass index (BMI) 38.0-38.9, adult: Secondary | ICD-10-CM | POA: Diagnosis not present

## 2018-08-17 DIAGNOSIS — I119 Hypertensive heart disease without heart failure: Secondary | ICD-10-CM | POA: Diagnosis not present

## 2018-08-17 DIAGNOSIS — Z9181 History of falling: Secondary | ICD-10-CM | POA: Diagnosis not present

## 2018-08-17 DIAGNOSIS — E669 Obesity, unspecified: Secondary | ICD-10-CM | POA: Diagnosis not present

## 2018-08-18 DIAGNOSIS — I69354 Hemiplegia and hemiparesis following cerebral infarction affecting left non-dominant side: Secondary | ICD-10-CM | POA: Diagnosis not present

## 2018-08-18 DIAGNOSIS — Z6838 Body mass index (BMI) 38.0-38.9, adult: Secondary | ICD-10-CM | POA: Diagnosis not present

## 2018-08-18 DIAGNOSIS — E669 Obesity, unspecified: Secondary | ICD-10-CM | POA: Diagnosis not present

## 2018-08-18 DIAGNOSIS — Z9181 History of falling: Secondary | ICD-10-CM | POA: Diagnosis not present

## 2018-08-18 DIAGNOSIS — I119 Hypertensive heart disease without heart failure: Secondary | ICD-10-CM | POA: Diagnosis not present

## 2018-08-18 DIAGNOSIS — Z87891 Personal history of nicotine dependence: Secondary | ICD-10-CM | POA: Diagnosis not present

## 2018-08-19 DIAGNOSIS — Z87891 Personal history of nicotine dependence: Secondary | ICD-10-CM | POA: Diagnosis not present

## 2018-08-19 DIAGNOSIS — Z6838 Body mass index (BMI) 38.0-38.9, adult: Secondary | ICD-10-CM | POA: Diagnosis not present

## 2018-08-19 DIAGNOSIS — I69354 Hemiplegia and hemiparesis following cerebral infarction affecting left non-dominant side: Secondary | ICD-10-CM | POA: Diagnosis not present

## 2018-08-19 DIAGNOSIS — E669 Obesity, unspecified: Secondary | ICD-10-CM | POA: Diagnosis not present

## 2018-08-19 DIAGNOSIS — Z9181 History of falling: Secondary | ICD-10-CM | POA: Diagnosis not present

## 2018-08-19 DIAGNOSIS — I119 Hypertensive heart disease without heart failure: Secondary | ICD-10-CM | POA: Diagnosis not present

## 2018-08-20 ENCOUNTER — Other Ambulatory Visit: Payer: Self-pay | Admitting: Foot & Ankle Surgery

## 2018-08-20 ENCOUNTER — Other Ambulatory Visit (HOSPITAL_COMMUNITY): Payer: Self-pay | Admitting: Foot & Ankle Surgery

## 2018-08-20 DIAGNOSIS — Z9181 History of falling: Secondary | ICD-10-CM | POA: Diagnosis not present

## 2018-08-20 DIAGNOSIS — Z6838 Body mass index (BMI) 38.0-38.9, adult: Secondary | ICD-10-CM | POA: Diagnosis not present

## 2018-08-20 DIAGNOSIS — I69354 Hemiplegia and hemiparesis following cerebral infarction affecting left non-dominant side: Secondary | ICD-10-CM | POA: Diagnosis not present

## 2018-08-20 DIAGNOSIS — I739 Peripheral vascular disease, unspecified: Secondary | ICD-10-CM

## 2018-08-20 DIAGNOSIS — Z87891 Personal history of nicotine dependence: Secondary | ICD-10-CM | POA: Diagnosis not present

## 2018-08-20 DIAGNOSIS — E669 Obesity, unspecified: Secondary | ICD-10-CM | POA: Diagnosis not present

## 2018-08-20 DIAGNOSIS — I119 Hypertensive heart disease without heart failure: Secondary | ICD-10-CM | POA: Diagnosis not present

## 2018-08-21 DIAGNOSIS — I6930 Unspecified sequelae of cerebral infarction: Secondary | ICD-10-CM | POA: Diagnosis not present

## 2018-08-24 ENCOUNTER — Ambulatory Visit (HOSPITAL_COMMUNITY)
Admission: RE | Admit: 2018-08-24 | Discharge: 2018-08-24 | Disposition: A | Discharge status: Home / Self Care | Payer: Medicare Other | Source: Ambulatory Visit | Attending: Cardiovascular Disease | Admitting: Cardiovascular Disease

## 2018-08-24 DIAGNOSIS — I739 Peripheral vascular disease, unspecified: Secondary | ICD-10-CM | POA: Diagnosis not present

## 2018-08-25 DIAGNOSIS — I69354 Hemiplegia and hemiparesis following cerebral infarction affecting left non-dominant side: Secondary | ICD-10-CM | POA: Diagnosis not present

## 2018-08-25 DIAGNOSIS — I119 Hypertensive heart disease without heart failure: Secondary | ICD-10-CM | POA: Diagnosis not present

## 2018-08-25 DIAGNOSIS — Z6838 Body mass index (BMI) 38.0-38.9, adult: Secondary | ICD-10-CM | POA: Diagnosis not present

## 2018-08-25 DIAGNOSIS — Z9181 History of falling: Secondary | ICD-10-CM | POA: Diagnosis not present

## 2018-08-25 DIAGNOSIS — E669 Obesity, unspecified: Secondary | ICD-10-CM | POA: Diagnosis not present

## 2018-08-25 DIAGNOSIS — Z87891 Personal history of nicotine dependence: Secondary | ICD-10-CM | POA: Diagnosis not present

## 2018-08-27 DIAGNOSIS — E669 Obesity, unspecified: Secondary | ICD-10-CM | POA: Diagnosis not present

## 2018-08-27 DIAGNOSIS — Z9181 History of falling: Secondary | ICD-10-CM | POA: Diagnosis not present

## 2018-08-27 DIAGNOSIS — Z87891 Personal history of nicotine dependence: Secondary | ICD-10-CM | POA: Diagnosis not present

## 2018-08-27 DIAGNOSIS — I119 Hypertensive heart disease without heart failure: Secondary | ICD-10-CM | POA: Diagnosis not present

## 2018-08-27 DIAGNOSIS — Z6838 Body mass index (BMI) 38.0-38.9, adult: Secondary | ICD-10-CM | POA: Diagnosis not present

## 2018-08-27 DIAGNOSIS — I69354 Hemiplegia and hemiparesis following cerebral infarction affecting left non-dominant side: Secondary | ICD-10-CM | POA: Diagnosis not present

## 2018-08-28 DIAGNOSIS — Z9181 History of falling: Secondary | ICD-10-CM | POA: Diagnosis not present

## 2018-08-28 DIAGNOSIS — I119 Hypertensive heart disease without heart failure: Secondary | ICD-10-CM | POA: Diagnosis not present

## 2018-08-28 DIAGNOSIS — I69354 Hemiplegia and hemiparesis following cerebral infarction affecting left non-dominant side: Secondary | ICD-10-CM | POA: Diagnosis not present

## 2018-08-28 DIAGNOSIS — Z6838 Body mass index (BMI) 38.0-38.9, adult: Secondary | ICD-10-CM | POA: Diagnosis not present

## 2018-08-28 DIAGNOSIS — Z87891 Personal history of nicotine dependence: Secondary | ICD-10-CM | POA: Diagnosis not present

## 2018-08-28 DIAGNOSIS — Z87440 Personal history of urinary (tract) infections: Secondary | ICD-10-CM | POA: Diagnosis not present

## 2018-08-28 DIAGNOSIS — E669 Obesity, unspecified: Secondary | ICD-10-CM | POA: Diagnosis not present

## 2018-08-31 ENCOUNTER — Ambulatory Visit (HOSPITAL_COMMUNITY)
Admission: RE | Admit: 2018-08-31 | Discharge: 2018-08-31 | Disposition: A | Discharge status: Home / Self Care | Payer: Medicare Other | Source: Ambulatory Visit | Attending: Internal Medicine | Admitting: Internal Medicine

## 2018-08-31 DIAGNOSIS — Z6838 Body mass index (BMI) 38.0-38.9, adult: Secondary | ICD-10-CM | POA: Diagnosis not present

## 2018-08-31 DIAGNOSIS — I739 Peripheral vascular disease, unspecified: Secondary | ICD-10-CM

## 2018-08-31 DIAGNOSIS — I69354 Hemiplegia and hemiparesis following cerebral infarction affecting left non-dominant side: Secondary | ICD-10-CM | POA: Diagnosis not present

## 2018-08-31 DIAGNOSIS — Z87891 Personal history of nicotine dependence: Secondary | ICD-10-CM | POA: Diagnosis not present

## 2018-08-31 DIAGNOSIS — E669 Obesity, unspecified: Secondary | ICD-10-CM | POA: Diagnosis not present

## 2018-08-31 DIAGNOSIS — I119 Hypertensive heart disease without heart failure: Secondary | ICD-10-CM | POA: Diagnosis not present

## 2018-08-31 DIAGNOSIS — Z9181 History of falling: Secondary | ICD-10-CM | POA: Diagnosis not present

## 2018-09-02 ENCOUNTER — Encounter: Payer: Self-pay | Admitting: Cardiovascular Disease

## 2018-09-02 ENCOUNTER — Ambulatory Visit (INDEPENDENT_AMBULATORY_CARE_PROVIDER_SITE_OTHER): Payer: Medicare Other | Admitting: Cardiovascular Disease

## 2018-09-02 DIAGNOSIS — I739 Peripheral vascular disease, unspecified: Secondary | ICD-10-CM

## 2018-09-02 DIAGNOSIS — I10 Essential (primary) hypertension: Secondary | ICD-10-CM | POA: Diagnosis not present

## 2018-09-02 DIAGNOSIS — E785 Hyperlipidemia, unspecified: Secondary | ICD-10-CM

## 2018-09-02 NOTE — Patient Instructions (Signed)
Medication Instructions:  Your physician recommends that you continue on your current medications as directed. Please refer to the Current Medication list given to you today.  If you need a refill on your cardiac medications before your next appointment, please call your pharmacy.   Lab work: Your physician recommends that you return for lab work today: lipid and liver panels  If you have labs (blood work) drawn today and your tests are completely normal, you will receive your results only by: Marland Kitchen MyChart Message (if you have MyChart) OR . A paper copy in the mail If you have any lab test that is abnormal or we need to change your treatment, we will call you to review the results.  Testing/Procedures: NONE  Follow-Up: At [facility], you and your health needs are our priority.  As part of our continuing mission to provide you with exceptional heart care, we have created designated Provider Care Teams.  These Care Teams include your primary Cardiologist (physician) and Advanced Practice Providers (APPs -  Physician Assistants and Nurse Practitioners) who all work together to provide you with the care you need, when you need it. . You may schedule a follow up appointment as needed. You may see Dr. Gwenlyn Found or one of the following Advanced Practice Providers on your designated Care Team:   . Kerin Ransom, [place] . Almyra Deforest, PA-C . Fabian Sharp, PA-C . Jory Sims, DNP . Rosaria Ferries, PA-C . Roby Lofts, PA-C . Sande Rives, PA-C  Any Other Special Instructions Will Be Listed Below (If Applicable). YOU WILL RECEIVE A CALL FROM A HEARTCARE CLINICAL PHARMACIST REGARDING REPATHA

## 2018-09-02 NOTE — Assessment & Plan Note (Signed)
Mary Le was referred by Dr. Melony Overly, her podiatrist, for evaluation of PAD.  She had a history of a disabling stroke 11/15 leaving her somewhat hemiparetic with left-sided deficit.  She also has arthritis in her back.  She is for all intents and purposes wheelchair-bound.  She walks minimally with a walker and for the most part stays in the house.  She complains of pain in her leg principally in the morning when she wakes up.  There is no evidence of critical limb ischemia.  Dopplers performed 09/01/2018 revealed a right ABI of 0.68 and a left of 0.63.  She did have a high-frequency signal in her proximal right SFA and proximal left SFA as well.  I do not think her symptoms are vascular in nature.

## 2018-09-02 NOTE — Assessment & Plan Note (Signed)
History of essential hypertension blood pressure measured today at 146/88.  She is on metoprolol.

## 2018-09-02 NOTE — Assessment & Plan Note (Signed)
History of dyslipidemia intolerant to statin therapy with lipid profile performed 03/25/2018 revealing a total cholesterol 263, LDL of 180 and HDL 55.  She may be a candidate for Repatha

## 2018-09-02 NOTE — Progress Notes (Signed)
09/02/2018 Mary Le   July 14, 1946  [id number]  Primary Physician Patient, No Pcp Per Primary Cardiologist: Lorretta Harp MD FACP, [place], [place], [place]  HPI:  Mary Le is a 73 y.o. moderately overweight recently widowed (husband of 35 years died on 08/05/2023) who is accompanied by her daughter Mary Le.  She was referred by Dr. Melony Overly for peripheral vascular valuation because of leg pain.  She is retired from working in a [employer].  She smoked remotely.  She is she has a history of treated hypertension and hyperlipidemia intolerant to statin therapy.  Her mother died of a myocardial infarction age 85.  She is never had a heart attack but has had a disabling stroke 11/15 leaving her with a left-sided deficit.  She is essentially wheelchair-bound.  She has arthritis in her back as well.  She complains of pain in her left leg in the morning when she wakes up.  There is no evidence of critical limb ischemia.  Dopplers performed in our office 09/01/2018 revealed a right ABI of 0.68 and a left ABI 0.63 with right greater than left SFA high-frequency signals.    Current Meds  Medication Sig  . acetaminophen (TYLENOL) 325 MG tablet Take 650 mg by mouth every 6 (six) hours as needed (pain).   Marland Kitchen aspirin 81 MG tablet Take 81 mg by mouth daily.  . famotidine (PEPCID) 10 MG tablet Take 1 tablet (10 mg total) by mouth 2 (two) times daily. (Patient taking differently: Take 10 mg by mouth daily as needed for heartburn or indigestion. )  . loratadine (ALAVERT) 10 MG tablet Take 10 mg by mouth daily as needed for allergies.  . metoprolol succinate (TOPROL-XL) 25 MG 24 hr tablet Take 12.5 mg by mouth daily.  . Multiple Vitamin (MULTIVITAMIN WITH MINERALS) TABS tablet Take 1 tablet by mouth daily. Reported on 11/22/2015  . Polyethyl Glycol-Propyl Glycol (SYSTANE OP) Place 1 drop into both eyes daily as needed (dry eyes).   . polyethylene glycol powder (GLYCOLAX/MIRALAX) powder Take 17 g by mouth See admin  instructions. AS NEEDED EVERY 3-4 DAYS FOR CONSTIPATION  . senna-docusate (SENOKOT-S) 8.6-50 MG per tablet Take 3 tablets by mouth 2 (two) times daily. (Patient taking differently: Take 2 tablets by mouth 2 (two) times daily as needed for mild constipation. )  . [DISCONTINUED] cromolyn (NASALCROM) 5.2 MG/ACT nasal spray Place 1 spray into both nostrils as needed for allergies.  . [DISCONTINUED] metoprolol succinate (TOPROL-XL) 25 MG 24 hr tablet TAKE 1/2 (ONE-HALF) TABLET BY MOUTH ONCE DAILY (Patient taking differently: Take 12.5 mg by mouth daily. )     Allergies  Allergen Reactions  . Tizanidine Itching  . Aspirin Nausea And Vomiting    Tolerated baby aspirin, but with more than once per day of full strength for aches and pains - had stomach upset.  No history of PUD/gastric bleeding known.   . Statins Other (See Comments)    Myalgias and chest pain (has tried Lipitor and Pravachol)    Social History   Socioeconomic History  . Marital status: Married    Spouse name: Not on file  . Number of children: 1  . Years of education: Not on file  . Highest education level: Not on file  Occupational History  . Occupation: RETIRED  Social Needs  . Financial resource strain: Not on file  . Food insecurity:    Worry: Not on file    Inability: Not on file  . Transportation  needs:    Medical: Not on file    Non-medical: Not on file  Tobacco Use  . Smoking status: Former Smoker    Years: 5.00  . Smokeless tobacco: Never Used  . Tobacco comment: quit smoking 40 years ago  Substance and Sexual Activity  . Alcohol use: Yes    Alcohol/week: 1.0 standard drinks    Types: 1 Glasses of wine per week    Comment: occasionally wine  . Drug use: No  . Sexual activity: Not on file  Lifestyle  . Physical activity:    Days per week: Not on file    Minutes per session: Not on file  . Stress: Not on file  Relationships  . Social connections:    Talks on phone: Not on file    Gets together: Not  on file    Attends religious service: Not on file    Active member of club or organization: Not on file    Attends meetings of clubs or organizations: Not on file    Relationship status: Not on file  . Intimate partner violence:    Fear of current or ex partner: Not on file    Emotionally abused: Not on file    Physically abused: Not on file    Forced sexual activity: Not on file  Other Topics Concern  . Not on file  Social History Narrative   Lives in [place]    Married. Education: [employer].      Review of Systems: General: negative for chills, fever, night sweats or weight changes.  Cardiovascular: negative for chest pain, dyspnea on exertion, edema, orthopnea, palpitations, paroxysmal nocturnal dyspnea or shortness of breath Dermatological: negative for rash Respiratory: negative for cough or wheezing Urologic: negative for hematuria Abdominal: negative for nausea, vomiting, diarrhea, bright red blood per rectum, melena, or hematemesis Neurologic: negative for visual changes, syncope, or dizziness All other systems reviewed and are otherwise negative except as noted above.    Blood pressure (!) 146/88, pulse 98, height 5\' 4"  (1.626 m), weight 221 lb (100.2 kg).  General appearance: alert and no distress Neck: no adenopathy, no carotid bruit, no JVD, supple, symmetrical, trachea midline and thyroid not enlarged, symmetric, no tenderness/mass/nodules Lungs: clear to auscultation bilaterally Heart: regular rate and rhythm, S1, S2 normal, no murmur, click, rub or gallop Extremities: extremities normal, atraumatic, no cyanosis or edema Pulses: 2+ and symmetric Skin: Skin color, texture, turgor normal. No rashes or lesions Neurologic: Alert and oriented X 3, normal strength and tone. Normal symmetric reflexes. Normal coordination and gait  EKG sinus rhythm 98 left axis deviation and poor R wave progression.  I personally reviewed this EKG.  ASSESSMENT AND PLAN:   HTN  (hypertension) History of essential hypertension blood pressure measured today at 146/88.  She is on metoprolol.  Dyslipidemia- statin intol History of dyslipidemia intolerant to statin therapy with lipid profile performed 03/25/2018 revealing a total cholesterol 263, LDL of 180 and HDL 55.  She may be a candidate for Repatha  Peripheral arterial disease ([place]) Ms. Cowing was referred by Dr. Melony Overly, her podiatrist, for evaluation of PAD.  She had a history of a disabling stroke 11/15 leaving her somewhat hemiparetic with left-sided deficit.  She also has arthritis in her back.  She is for all intents and purposes wheelchair-bound.  She walks minimally with a walker and for the most part stays in the house.  She complains of pain in her leg principally in the morning when she wakes up.  There is no evidence of critical limb ischemia.  Dopplers performed 09/01/2018 revealed a right ABI of 0.68 and a left of 0.63.  She did have a high-frequency signal in her proximal right SFA and proximal left SFA as well.  I do not think her symptoms are vascular in nature.      Lorretta Harp MD FACP,FACC,FAHA, [facility] 09/02/2018 2:35 PM

## 2018-09-03 ENCOUNTER — Encounter: Payer: Self-pay | Admitting: *Deleted

## 2018-09-03 DIAGNOSIS — E669 Obesity, unspecified: Secondary | ICD-10-CM | POA: Diagnosis not present

## 2018-09-03 DIAGNOSIS — I119 Hypertensive heart disease without heart failure: Secondary | ICD-10-CM | POA: Diagnosis not present

## 2018-09-03 DIAGNOSIS — Z9181 History of falling: Secondary | ICD-10-CM | POA: Diagnosis not present

## 2018-09-03 DIAGNOSIS — Z6838 Body mass index (BMI) 38.0-38.9, adult: Secondary | ICD-10-CM | POA: Diagnosis not present

## 2018-09-03 DIAGNOSIS — I69354 Hemiplegia and hemiparesis following cerebral infarction affecting left non-dominant side: Secondary | ICD-10-CM | POA: Diagnosis not present

## 2018-09-03 DIAGNOSIS — Z87891 Personal history of nicotine dependence: Secondary | ICD-10-CM | POA: Diagnosis not present

## 2018-09-03 LAB — LIPID PANEL
Chol/HDL Ratio: 4.8 ratio — ABNORMAL HIGH (ref 0.0–4.4)
Cholesterol, Total: 269 mg/dL — ABNORMAL HIGH (ref 100–199)
HDL: 56 mg/dL (ref >39)
LDL Calculated: 171 mg/dL — ABNORMAL HIGH (ref 0–99)
Triglycerides: 208 mg/dL — ABNORMAL HIGH (ref 0–149)
VLDL Cholesterol Cal: 42 mg/dL — ABNORMAL HIGH (ref 5–40)

## 2018-09-03 LAB — HEPATIC FUNCTION PANEL
ALT: 22 IU/L (ref 0–32)
AST: 19 IU/L (ref 0–40)
Albumin: 4.1 g/dL (ref 3.8–4.8)
Alkaline Phosphatase: 82 IU/L (ref 39–117)
Bilirubin Total: 0.2 mg/dL (ref 0.0–1.2)
Bilirubin, Direct: 0.08 mg/dL (ref 0.00–0.40)
Total Protein: 7.4 g/dL (ref 6.0–8.5)

## 2018-09-05 DIAGNOSIS — I10 Essential (primary) hypertension: Secondary | ICD-10-CM | POA: Diagnosis not present

## 2018-09-05 DIAGNOSIS — Z7902 Long term (current) use of antithrombotics/antiplatelets: Secondary | ICD-10-CM | POA: Diagnosis not present

## 2018-09-07 DIAGNOSIS — Z87891 Personal history of nicotine dependence: Secondary | ICD-10-CM | POA: Diagnosis not present

## 2018-09-07 DIAGNOSIS — Z6838 Body mass index (BMI) 38.0-38.9, adult: Secondary | ICD-10-CM | POA: Diagnosis not present

## 2018-09-07 DIAGNOSIS — I119 Hypertensive heart disease without heart failure: Secondary | ICD-10-CM | POA: Diagnosis not present

## 2018-09-07 DIAGNOSIS — Z9181 History of falling: Secondary | ICD-10-CM | POA: Diagnosis not present

## 2018-09-07 DIAGNOSIS — E669 Obesity, unspecified: Secondary | ICD-10-CM | POA: Diagnosis not present

## 2018-09-07 DIAGNOSIS — I69354 Hemiplegia and hemiparesis following cerebral infarction affecting left non-dominant side: Secondary | ICD-10-CM | POA: Diagnosis not present

## 2018-09-10 DIAGNOSIS — I69354 Hemiplegia and hemiparesis following cerebral infarction affecting left non-dominant side: Secondary | ICD-10-CM | POA: Diagnosis not present

## 2018-09-10 DIAGNOSIS — Z87891 Personal history of nicotine dependence: Secondary | ICD-10-CM | POA: Diagnosis not present

## 2018-09-10 DIAGNOSIS — Z6838 Body mass index (BMI) 38.0-38.9, adult: Secondary | ICD-10-CM | POA: Diagnosis not present

## 2018-09-10 DIAGNOSIS — E669 Obesity, unspecified: Secondary | ICD-10-CM | POA: Diagnosis not present

## 2018-09-10 DIAGNOSIS — Z9181 History of falling: Secondary | ICD-10-CM | POA: Diagnosis not present

## 2018-09-10 DIAGNOSIS — I119 Hypertensive heart disease without heart failure: Secondary | ICD-10-CM | POA: Diagnosis not present

## 2018-09-14 ENCOUNTER — Telehealth: Payer: Self-pay

## 2018-09-14 DIAGNOSIS — Z87891 Personal history of nicotine dependence: Secondary | ICD-10-CM | POA: Diagnosis not present

## 2018-09-14 DIAGNOSIS — Z9181 History of falling: Secondary | ICD-10-CM | POA: Diagnosis not present

## 2018-09-14 DIAGNOSIS — E669 Obesity, unspecified: Secondary | ICD-10-CM | POA: Diagnosis not present

## 2018-09-14 DIAGNOSIS — I119 Hypertensive heart disease without heart failure: Secondary | ICD-10-CM | POA: Diagnosis not present

## 2018-09-14 DIAGNOSIS — Z6838 Body mass index (BMI) 38.0-38.9, adult: Secondary | ICD-10-CM | POA: Diagnosis not present

## 2018-09-14 DIAGNOSIS — I69354 Hemiplegia and hemiparesis following cerebral infarction affecting left non-dominant side: Secondary | ICD-10-CM | POA: Diagnosis not present

## 2018-09-14 MED ORDER — EVOLOCUMAB 140 MG/ML ~~LOC~~ SOAJ: 140.0000 mg | SUBCUTANEOUS | 6 refills | Status: DC

## 2018-09-14 NOTE — Telephone Encounter (Signed)
Called to let the pt know that pa approved for repatha and sent rx

## 2018-09-17 DIAGNOSIS — E669 Obesity, unspecified: Secondary | ICD-10-CM | POA: Diagnosis not present

## 2018-09-17 DIAGNOSIS — Z6838 Body mass index (BMI) 38.0-38.9, adult: Secondary | ICD-10-CM | POA: Diagnosis not present

## 2018-09-17 DIAGNOSIS — I69354 Hemiplegia and hemiparesis following cerebral infarction affecting left non-dominant side: Secondary | ICD-10-CM | POA: Diagnosis not present

## 2018-09-17 DIAGNOSIS — I119 Hypertensive heart disease without heart failure: Secondary | ICD-10-CM | POA: Diagnosis not present

## 2018-09-17 DIAGNOSIS — Z9181 History of falling: Secondary | ICD-10-CM | POA: Diagnosis not present

## 2018-09-17 DIAGNOSIS — Z87891 Personal history of nicotine dependence: Secondary | ICD-10-CM | POA: Diagnosis not present

## 2018-09-18 DIAGNOSIS — Z6838 Body mass index (BMI) 38.0-38.9, adult: Secondary | ICD-10-CM | POA: Diagnosis not present

## 2018-09-18 DIAGNOSIS — I69354 Hemiplegia and hemiparesis following cerebral infarction affecting left non-dominant side: Secondary | ICD-10-CM | POA: Diagnosis not present

## 2018-09-18 DIAGNOSIS — E669 Obesity, unspecified: Secondary | ICD-10-CM | POA: Diagnosis not present

## 2018-09-18 DIAGNOSIS — Z9181 History of falling: Secondary | ICD-10-CM | POA: Diagnosis not present

## 2018-09-18 DIAGNOSIS — Z87891 Personal history of nicotine dependence: Secondary | ICD-10-CM | POA: Diagnosis not present

## 2018-09-18 DIAGNOSIS — I119 Hypertensive heart disease without heart failure: Secondary | ICD-10-CM | POA: Diagnosis not present

## 2018-09-23 DIAGNOSIS — I119 Hypertensive heart disease without heart failure: Secondary | ICD-10-CM | POA: Diagnosis not present

## 2018-09-23 DIAGNOSIS — Z9181 History of falling: Secondary | ICD-10-CM | POA: Diagnosis not present

## 2018-09-23 DIAGNOSIS — E669 Obesity, unspecified: Secondary | ICD-10-CM | POA: Diagnosis not present

## 2018-09-23 DIAGNOSIS — Z6838 Body mass index (BMI) 38.0-38.9, adult: Secondary | ICD-10-CM | POA: Diagnosis not present

## 2018-09-23 DIAGNOSIS — Z87891 Personal history of nicotine dependence: Secondary | ICD-10-CM | POA: Diagnosis not present

## 2018-09-23 DIAGNOSIS — I69354 Hemiplegia and hemiparesis following cerebral infarction affecting left non-dominant side: Secondary | ICD-10-CM | POA: Diagnosis not present

## 2018-09-24 DIAGNOSIS — E669 Obesity, unspecified: Secondary | ICD-10-CM | POA: Diagnosis not present

## 2018-09-24 DIAGNOSIS — I119 Hypertensive heart disease without heart failure: Secondary | ICD-10-CM | POA: Diagnosis not present

## 2018-09-24 DIAGNOSIS — Z9181 History of falling: Secondary | ICD-10-CM | POA: Diagnosis not present

## 2018-09-24 DIAGNOSIS — Z87891 Personal history of nicotine dependence: Secondary | ICD-10-CM | POA: Diagnosis not present

## 2018-09-24 DIAGNOSIS — Z6838 Body mass index (BMI) 38.0-38.9, adult: Secondary | ICD-10-CM | POA: Diagnosis not present

## 2018-09-24 DIAGNOSIS — I69354 Hemiplegia and hemiparesis following cerebral infarction affecting left non-dominant side: Secondary | ICD-10-CM | POA: Diagnosis not present

## 2018-11-12 DIAGNOSIS — E785 Hyperlipidemia, unspecified: Secondary | ICD-10-CM | POA: Diagnosis not present

## 2018-11-12 DIAGNOSIS — Z7902 Long term (current) use of antithrombotics/antiplatelets: Secondary | ICD-10-CM | POA: Diagnosis not present

## 2018-11-12 DIAGNOSIS — R310 Gross hematuria: Secondary | ICD-10-CM | POA: Diagnosis not present

## 2018-11-16 DIAGNOSIS — N390 Urinary tract infection, site not specified: Secondary | ICD-10-CM | POA: Diagnosis not present

## 2018-11-20 ENCOUNTER — Encounter: Payer: Medicare Other | Attending: Physical Medicine & Rehabilitation | Admitting: Physical Medicine & Rehabilitation

## 2018-11-20 ENCOUNTER — Other Ambulatory Visit: Payer: Self-pay

## 2018-11-20 ENCOUNTER — Telehealth: Payer: Self-pay | Admitting: Registered Nurse

## 2018-11-20 ENCOUNTER — Encounter: Payer: Self-pay | Admitting: Physical Medicine & Rehabilitation

## 2018-11-20 VITALS — BP 155/89 | HR 61 | Ht 63.0 in | Wt 205.0 lb

## 2018-11-20 DIAGNOSIS — G8110 Spastic hemiplegia affecting unspecified side: Secondary | ICD-10-CM

## 2018-11-20 NOTE — Progress Notes (Addendum)
Subjective:    Patient ID: Mary Le, female    DOB: 1946-01-16, 73 y.o.   MRN: [id number] Consent from patient verbally for audio and video web ex visit patient is competent to do this.  Daughter is with her at her request HPI   73 year old female with history of right MCA distribution infarct causing left hemiparesis.  She has been severely just debilitated from her stroke.  She requires assistance for dressing and bathing and toileting.  She has no functional use of the left upper extremity.  She is wheelchair-bound and currently unable to propel her wheelchair because she is using her husband's old wheelchair.  A new lightweight manual wheelchair was ordered last fall and the patient received this in January however has been unable to use it because it is too narrow.  The patient and her daughter have been trying to call the DME supplier, [facility] to exchange or modify.  Thus far they have not had much response. Patient and daughter are also inquiring about a disabled parking placard.  Rolled out of bed the other day no injury   Last visit with me was on 04/09/2018 for a botulinum toxin injection. Pectoralis100 Brachioradialis50 Biceps 50 FDS50 FDP50 FPL50 PT 25 PQ 25  The patient states that the left shoulder and left elbow are still loose.  The left hand never responded very well.  She does not feel like she needs another injection at the current time. The patient had a urinary tract infection which did not cause any serious complications.  She did have a headache with elevated blood pressures on 07/13/2018 prompting an ED visit. Pain Inventory Average Pain 0 Pain Right Now 0 My pain is na  In the last 24 hours, has pain interfered with the following? General activity na Relation with others na Enjoyment of life na What TIME of day is your pain at its worst? na Sleep (in general) na  Pain is worse with: na Pain improves with: na Relief from Meds: na   Mobility walk without assistance use a cane use a wheelchair needs help with transfers  Function retired  Neuro/Psych bladder control problems weakness trouble walking depression anxiety  Prior Studies Any changes since last visit?  no  Physicians involved in your care Any changes since last visit?  no   Family History  Problem Relation Age of Onset  . Coronary artery disease Mother 69       MI  . Kidney disease Father   . Breast cancer Daughter   . Stomach cancer Other   . Colon cancer Neg Hx   . Liver cancer Neg Hx   . Colon polyps Neg Hx   . Esophageal cancer Neg Hx   . Rectal cancer Neg Hx    Social History   Socioeconomic History  . Marital status: Married    Spouse name: Not on file  . Number of children: 1  . Years of education: Not on file  . Highest education level: Not on file  Occupational History  . Occupation: RETIRED  Social Needs  . Financial resource strain: Not on file  . Food insecurity:    Worry: Not on file    Inability: Not on file  . Transportation needs:    Medical: Not on file    Non-medical: Not on file  Tobacco Use  . Smoking status: Former Smoker    Years: 5.00  . Smokeless tobacco: Never Used  . Tobacco comment: quit smoking  40 years ago  Substance and Sexual Activity  . Alcohol use: Yes    Alcohol/week: 1.0 standard drinks    Types: 1 Glasses of wine per week    Comment: occasionally wine  . Drug use: No  . Sexual activity: Not on file  Lifestyle  . Physical activity:    Days per week: Not on file    Minutes per session: Not on file  . Stress: Not on file  Relationships  . Social connections:    Talks on phone: Not on file    Gets together: Not on file    Attends religious service: Not on file    Active member of club or organization: Not on file    Attends meetings of clubs or organizations: Not on file    Relationship status: Not on file  Other Topics Concern  . Not on file  Social History Narrative    Lives in [place]    Married. Education: [employer].    Past Surgical History:  Procedure Laterality Date  . APPENDECTOMY    . HEMORROIDECTOMY    . IR RADIOLOGY PERIPHERAL GUIDED IV START  06/18/2017  . IR RADIOLOGY PERIPHERAL GUIDED IV START  07/09/2018  . IR US GUIDE VASC ACCESS RIGHT  06/18/2017  . IR US GUIDE VASC ACCESS RIGHT  07/09/2018  . LOOP RECORDER IMPLANT  06/07/2014   MDT LINQ implanted by Dr [name] for cryptogenic stroke  . LOOP RECORDER IMPLANT N/A 06/07/2014   Procedure: LOOP RECORDER IMPLANT;  Surgeon: [name], MD;  Location: [place] CATH LAB;  Service: Cardiovascular;  Laterality: N/A;  . RADIOLOGY WITH ANESTHESIA N/A 06/03/2014   Procedure: RADIOLOGY WITH ANESTHESIA;  Surgeon: [name], MD;  Location: [place];  Service: Radiology;  Laterality: N/A;  . TEE WITHOUT CARDIOVERSION N/A 06/07/2014   Procedure: TRANSESOPHAGEAL ECHOCARDIOGRAM (TEE);  Surgeon: [name], MD;  Location[facility] ENDOSCOPY;  Service: Cardiovascular;  Laterality: N/A;  . TONSILLECTOMY    . TUBAL LIGATION     Past Medical History:  Diagnosis Date  . Allergy   . Anxiety   . Arthritis    back  . Back pain    arthritis  . Clotting disorder ([place])   . CVA (cerebral infarction)   . Depression   . Fall 11/2014   fell in bathroom, uses a cane  . Hypercholesteremia   . Hypertension   . Joint pain    back, history of   . Stroke ([place])   . Weakness    There were no vitals taken for this visit.  Opioid Risk Score:   Fall Risk Score:  `1  Depression screen PHQ 2/9  Depression screen [facility] 2/9 03/25/2018 02/26/2018 01/08/2018 08/25/2017 01/09/2017 12/05/2016 11/22/2015  Decreased Interest 0 1 1 1  0 0 0  Down, Depressed, Hopeless 2 1 1 1  0 0 0  PHQ - 2 Score 2 2 2 2  0 0 0  Altered sleeping 0 - - - - - -  Tired, decreased energy 3 - - - - - -  Change in appetite 0 - - - - - -  Feeling bad or failure about yourself  1 - - - - - -  Trouble concentrating 0 - - - - - -  Moving slowly or  fidgety/restless 0 - - - - - -  Suicidal thoughts 0 - - - - - -  PHQ-9 Score 6 - - - - - -  Some recent data might be hidden  Review of Systems  Constitutional: Negative.   HENT: Negative.   Eyes: Negative.   Respiratory: Negative.   Cardiovascular: Negative.   Gastrointestinal: Negative.   Endocrine: Negative.   Genitourinary: Positive for difficulty urinating.  Musculoskeletal: Positive for gait problem.  Skin: Negative.   Allergic/Immunologic: Negative.   Hematological: Negative.   Psychiatric/Behavioral: Positive for dysphoric mood. The patient is nervous/anxious.   All other systems reviewed and are negative.      Objective:   Physical Exam Constitutional:      Appearance: Normal appearance. She is obese.  HENT:     Head: Normocephalic and atraumatic.  Eyes:     Extraocular Movements: Extraocular movements intact.     Pupils: Pupils are equal, round, and reactive to light.  Neurological:     Mental Status: She is alert.     Comments: The patient is unable to move her left hand or wrist.  She does have 2- elbow flexion but 0 elbow extension no active shoulder abduction. The patient's daughter ranged her left upper extremity patient has good abduction to 90 degrees at the shoulder and elbow extension appears to be easy to achieve.  The patient does have flexion and MCPs limiting hand extension  Psychiatric:        Mood and Affect: Mood normal.        Behavior: Behavior normal.        Thought Content: Thought content normal.           Assessment & Plan:  1 right MCA distribution infarct with left spastic hemiplegia.  She is chronically disabled.  I do think she needs a permanent parking placard. Her spasticity responded well at her shoulder and elbow to the botulinum toxin injection but does not require a repeat injection at the current time.  Her hand and wrist did not respond as well possibly because lumbricals were mainly involved.  The major concern today  is her new lightweight manual wheelchair.  It is too narrow for her and she is unable to use this.  It is sitting at her home unused.  She would like to exchange this for the proper size.  I have talked to the my nurse practitioner Danella Sensing who will call [facility].  The patient will see Zella Ball in around 6 weeks make sure everything is fitting with the chair.  At this point I do not see a need for repeat Botox injection, if we do it in the future I would recommend the pectoralis as well as the elbow flexors, possibly doing the hand lumbricals but otherwise focusing more proximally.  Duration of visit 15 minutes

## 2018-11-20 NOTE — Telephone Encounter (Signed)
Placed a call to Ms. Glastetter regarding her manual wheelchair, she had a telephone visit with Dr. Letta Pate today. I spoke with Ms. Coulston daughter she reports [facility] delivered a manual wheelchair in January and it's too narrow. The family has tried to call [facility] regarding the above without any success. Placed a call to [facility] and they were stating they couldn't find the order. Placed a call to Andria Rhein, she will assist in this matter, this was relayed to Ms. Ingber daughter she verbalizes understanding. Andria Rhein will call with an update.

## 2018-12-01 ENCOUNTER — Telehealth: Payer: Self-pay

## 2018-12-01 NOTE — Telephone Encounter (Signed)
Call to pt re overdue health maintenance items, spoke with daughter [name] who is on [place].  Daughter states they now use PCP who comes to home.  Is much easier for them given pt's medical conditions.  Do not expect to return to clinic.

## 2018-12-10 DIAGNOSIS — J329 Chronic sinusitis, unspecified: Secondary | ICD-10-CM | POA: Diagnosis not present

## 2018-12-10 DIAGNOSIS — I10 Essential (primary) hypertension: Secondary | ICD-10-CM | POA: Diagnosis not present

## 2018-12-10 DIAGNOSIS — B372 Candidiasis of skin and nail: Secondary | ICD-10-CM | POA: Diagnosis not present

## 2018-12-10 DIAGNOSIS — Z7902 Long term (current) use of antithrombotics/antiplatelets: Secondary | ICD-10-CM | POA: Diagnosis not present

## 2018-12-10 DIAGNOSIS — Z7189 Other specified counseling: Secondary | ICD-10-CM | POA: Diagnosis not present

## 2018-12-31 ENCOUNTER — Other Ambulatory Visit: Payer: Self-pay

## 2018-12-31 ENCOUNTER — Encounter: Payer: Medicare Other | Attending: Physical Medicine & Rehabilitation | Admitting: Registered Nurse

## 2018-12-31 DIAGNOSIS — G8110 Spastic hemiplegia affecting unspecified side: Secondary | ICD-10-CM | POA: Diagnosis not present

## 2018-12-31 DIAGNOSIS — I63511 Cerebral infarction due to unspecified occlusion or stenosis of right middle cerebral artery: Secondary | ICD-10-CM | POA: Diagnosis not present

## 2018-12-31 NOTE — Progress Notes (Signed)
Subjective:    Patient ID: Mary Le, female    DOB: 08-31-1945, 73 y.o.   MRN: [id number]  HPI: Mary Le is a 73 y.o. female her appointment was changed, due to national recommendations of social distancing due to [place] 19, an audio/video telehealth visit is felt to be most appropriate for this patient at this time.  See Chart message from today for the patient's consent to telehealth from [place].     She states her pain is located in her lower back. She rates her pain 8. Her current exercise regime is walking short distances with her walker.   Mary Le and her daughter reports they haven't heard from Mary Le regarding her wheelchair, this provider spoke with Mary Le. They ordered Mary Le a new chair and cushion and it has arrived this week. They will be calling Mary Le this week. Placed a call to Mary Le regarding the above, no answer. Left message regarding the above.   Mary Overland RN asked the Health and History Questions. This provider and Mary Le verified we were speaking with the correct person using two identifiers.   Pain Inventory Average Pain 8 Pain Right Now 8 My pain is aching  In the last 24 hours, has pain interfered with the following? General activity 4 Relation with others 4 Enjoyment of life 4 What TIME of day is your pain at its worst? varies--when walking Sleep (in general) Good  Pain is worse with: walking and some activites Pain improves with: rest Relief from Meds: na  Mobility use a cane do you drive?  no needs help with transfers  Function disabled: date disabled . I need assistance with the following:  dressing, bathing, meal prep, household duties and shopping  Neuro/Psych bladder control problems weakness trouble walking confusion depression  Prior Studies Any changes since last visit?  no  Physicians involved in your care Any changes since last visit?  no    Family History  Problem Relation Age of Onset  . Coronary artery disease Mother 68       MI  . Kidney disease Father   . Breast cancer Daughter   . Stomach cancer Other   . Colon cancer Neg Hx   . Liver cancer Neg Hx   . Colon polyps Neg Hx   . Esophageal cancer Neg Hx   . Rectal cancer Neg Hx    Social History   Socioeconomic History  . Marital status: Married    Spouse name: Not on file  . Number of children: 1  . Years of education: Not on file  . Highest education level: Not on file  Occupational History  . Occupation: RETIRED  Social Needs  . Financial resource strain: Not on file  . Food insecurity    Worry: Not on file    Inability: Not on file  . Transportation needs    Medical: Not on file    Non-medical: Not on file  Tobacco Use  . Smoking status: Former Smoker    Years: 5.00  . Smokeless tobacco: Never Used  . Tobacco comment: quit smoking 40 years ago  Substance and Sexual Activity  . Alcohol use: Yes    Alcohol/week: 1.0 standard drinks    Types: 1 Glasses of wine per week    Comment: occasionally wine  . Drug use: No  . Sexual activity: Not on file  Lifestyle  . Physical activity    Days  per week: Not on file    Minutes per session: Not on file  . Stress: Not on file  Relationships  . Social Herbalist on phone: Not on file    Gets together: Not on file    Attends religious service: Not on file    Active member of club or organization: Not on file    Attends meetings of clubs or organizations: Not on file    Relationship status: Not on file  Other Topics Concern  . Not on file  Social History Narrative   Lives in [place]    Married. Education: [employer].    Past Surgical History:  Procedure Laterality Date  . APPENDECTOMY    . HEMORROIDECTOMY    . IR RADIOLOGY PERIPHERAL GUIDED IV START  06/18/2017  . IR RADIOLOGY PERIPHERAL GUIDED IV START  07/09/2018  . IR US GUIDE VASC ACCESS RIGHT  06/18/2017  . IR US GUIDE VASC ACCESS  RIGHT  07/09/2018  . LOOP RECORDER IMPLANT  06/07/2014   MDT LINQ implanted by Dr [name] for cryptogenic stroke  . LOOP RECORDER IMPLANT N/A 06/07/2014   Procedure: LOOP RECORDER IMPLANT;  Surgeon: [name], MD;  Location: [place] CATH LAB;  Service: Cardiovascular;  Laterality: N/A;  . RADIOLOGY WITH ANESTHESIA N/A 06/03/2014   Procedure: RADIOLOGY WITH ANESTHESIA;  Surgeon: [name], MD;  Location: [place];  Service: Radiology;  Laterality: N/A;  . TEE WITHOUT CARDIOVERSION N/A 06/07/2014   Procedure: TRANSESOPHAGEAL ECHOCARDIOGRAM (TEE);  Surgeon: [name], MD;  Location: [facility] ENDOSCOPY;  Service: Cardiovascular;  Laterality: N/A;  . TONSILLECTOMY    . TUBAL LIGATION     Past Medical History:  Diagnosis Date  . Allergy   . Anxiety   . Arthritis    back  . Back pain    arthritis  . Clotting disorder ([place])   . CVA (cerebral infarction)   . Depression   . Fall 11/2014   fell in bathroom, uses a cane  . Hypercholesteremia   . Hypertension   . Joint pain    back, history of   . Stroke ([place])   . Weakness    There were no vitals taken for this visit.  Opioid Risk Score:   Fall Risk Score:  `1  Depression screen PHQ 2/9  Depression screen [facility] 2/9 12/31/2018 03/25/2018 02/26/2018 01/08/2018 08/25/2017 01/09/2017 12/05/2016  Decreased Interest 2 0 1 1 1  0 0  Down, Depressed, Hopeless 2 2 1 1 1  0 0  PHQ - 2 Score 4 2 2 2 2  0 0  Altered sleeping - 0 - - - - -  Tired, decreased energy - 3 - - - - -  Change in appetite - 0 - - - - -  Feeling bad or failure about yourself  - 1 - - - - -  Trouble concentrating - 0 - - - - -  Moving slowly or fidgety/restless - 0 - - - - -  Suicidal thoughts - 0 - - - - -  PHQ-9 Score - 6 - - - - -  Some recent data might be hidden    Review of Systems  Constitutional: Negative.   HENT: Negative.   Eyes: Negative.   Respiratory: Negative.   Cardiovascular: Negative.   Gastrointestinal: Negative.   Endocrine: Negative.    Genitourinary: Positive for urgency.  Musculoskeletal: Positive for gait problem.  Skin: Negative.   Allergic/Immunologic: Negative.   Hematological: Negative.   Psychiatric/Behavioral:  Positive for confusion and dysphoric mood.  All other systems reviewed and are negative.      Objective:   Physical Exam Vitals signs and nursing note reviewed.  Musculoskeletal:     Comments: No physical Exam Performed: Virtual Visit  Neurological:     Mental Status: She is oriented to person, place, and time.           Assessment & Plan:  1. Right MCA Distribution infarct with left Spastic Hemiplegia affecting Non dominant side: Continue To Monitor. S/P Botox on 04/09/2018  F/U in 3 months    [facility] Location of patient: In her Home Location of provider: Office Established patient Time spent on call: 10 minutes

## 2019-01-01 ENCOUNTER — Encounter: Payer: Self-pay | Admitting: Registered Nurse

## 2019-01-30 DIAGNOSIS — I10 Essential (primary) hypertension: Secondary | ICD-10-CM | POA: Diagnosis not present

## 2019-01-30 DIAGNOSIS — Z7902 Long term (current) use of antithrombotics/antiplatelets: Secondary | ICD-10-CM | POA: Diagnosis not present

## 2019-01-30 DIAGNOSIS — E860 Dehydration: Secondary | ICD-10-CM | POA: Diagnosis not present

## 2019-01-30 DIAGNOSIS — N390 Urinary tract infection, site not specified: Secondary | ICD-10-CM | POA: Diagnosis not present

## 2019-02-08 DIAGNOSIS — N390 Urinary tract infection, site not specified: Secondary | ICD-10-CM | POA: Diagnosis not present

## 2019-02-08 DIAGNOSIS — E785 Hyperlipidemia, unspecified: Secondary | ICD-10-CM | POA: Diagnosis not present

## 2019-02-08 DIAGNOSIS — Z7902 Long term (current) use of antithrombotics/antiplatelets: Secondary | ICD-10-CM | POA: Diagnosis not present

## 2019-02-08 DIAGNOSIS — I10 Essential (primary) hypertension: Secondary | ICD-10-CM | POA: Diagnosis not present

## 2019-02-15 ENCOUNTER — Telehealth: Payer: Self-pay | Admitting: Physical Medicine & Rehabilitation

## 2019-02-15 NOTE — Telephone Encounter (Signed)
Please set up for Botox with me.  200Units [id number], G81.10 Left pectoralis 100U, Left biceps 100U

## 2019-02-15 NOTE — Telephone Encounter (Signed)
I received a phone call from [facility] and she wanted to see if she could receive another botox injection. She has an appt with Zella Ball on 05/04/19 at 1:00 pm

## 2019-03-23 ENCOUNTER — Encounter: Payer: Medicare Other | Attending: Physical Medicine & Rehabilitation | Admitting: Physical Medicine & Rehabilitation

## 2019-03-23 ENCOUNTER — Other Ambulatory Visit: Payer: Self-pay

## 2019-03-23 VITALS — BP 154/86 | HR 58 | Temp 97.6°F

## 2019-03-23 DIAGNOSIS — G8100 Flaccid hemiplegia affecting unspecified side: Secondary | ICD-10-CM | POA: Diagnosis not present

## 2019-03-23 DIAGNOSIS — G8110 Spastic hemiplegia affecting unspecified side: Secondary | ICD-10-CM

## 2019-03-23 DIAGNOSIS — G8114 Spastic hemiplegia affecting left nondominant side: Secondary | ICD-10-CM | POA: Diagnosis present

## 2019-03-23 DIAGNOSIS — R252 Cramp and spasm: Secondary | ICD-10-CM | POA: Diagnosis not present

## 2019-03-23 NOTE — Patient Instructions (Signed)

## 2019-03-23 NOTE — Progress Notes (Signed)
Botox Injection for spasticity using needle EMG guidance  Dilution: 50 Units/ml Indication: Severe spasticity which interferes with ADL,mobility and/or  hygiene and is unresponsive to medication management and other conservative care Informed consent was obtained after describing risks and benefits of the procedure with the patient. This includes bleeding, bruising, infection, excessive weakness, or medication side effects. A REMS form is on file and signed. Needle: 27g 1" needle electrode Number of units per muscle  Pectoralis100 Brachioradialis25 Biceps 75 FDS50 FDP50  PT 50 PQ 50 All injections were done after obtaining appropriate EMG activity and after negative drawback for blood. The patient tolerated the procedure well. Post procedure instructions were given. A followup appointment was made.

## 2019-03-24 ENCOUNTER — Telehealth: Payer: Self-pay | Admitting: Registered Nurse

## 2019-03-24 NOTE — Telephone Encounter (Signed)
Mary Le was seen in the office on 12/31/2018 and this provider spoke with Andria Rhein regarding the wheelchair. Placed a call today and spoke with [name] daughter [name] she reports she received a call from [employer] and they are suppose to be delivering the wheelchair on Friday 03/26/2019. She was instructed to call office with any questions or concerns, she verbalizes understanding.

## 2019-04-20 DIAGNOSIS — K5909 Other constipation: Secondary | ICD-10-CM | POA: Diagnosis not present

## 2019-04-20 DIAGNOSIS — I10 Essential (primary) hypertension: Secondary | ICD-10-CM | POA: Diagnosis not present

## 2019-04-20 DIAGNOSIS — Z7902 Long term (current) use of antithrombotics/antiplatelets: Secondary | ICD-10-CM | POA: Diagnosis not present

## 2019-04-20 DIAGNOSIS — N390 Urinary tract infection, site not specified: Secondary | ICD-10-CM | POA: Diagnosis not present

## 2019-04-21 ENCOUNTER — Other Ambulatory Visit: Payer: Self-pay | Admitting: Internal Medicine

## 2019-04-21 DIAGNOSIS — Z1231 Encounter for screening mammogram for malignant neoplasm of breast: Secondary | ICD-10-CM

## 2019-05-04 ENCOUNTER — Ambulatory Visit: Payer: Medicare Other | Admitting: Registered Nurse

## 2019-05-18 ENCOUNTER — Encounter: Payer: Medicare Other | Attending: Physical Medicine & Rehabilitation | Admitting: Physical Medicine & Rehabilitation

## 2019-05-18 ENCOUNTER — Other Ambulatory Visit: Payer: Self-pay

## 2019-05-18 ENCOUNTER — Encounter: Payer: Self-pay | Admitting: Physical Medicine & Rehabilitation

## 2019-05-18 VITALS — BP 130/70 | HR 60 | Temp 97.0°F | Ht 63.0 in | Wt 208.0 lb

## 2019-05-18 DIAGNOSIS — G8114 Spastic hemiplegia affecting left nondominant side: Secondary | ICD-10-CM | POA: Diagnosis not present

## 2019-05-18 DIAGNOSIS — I63511 Cerebral infarction due to unspecified occlusion or stenosis of right middle cerebral artery: Secondary | ICD-10-CM

## 2019-05-18 DIAGNOSIS — G8110 Spastic hemiplegia affecting unspecified side: Secondary | ICD-10-CM | POA: Diagnosis not present

## 2019-05-18 NOTE — Progress Notes (Signed)
Subjective:    Patient ID: Mary Le, female    DOB: 1946/01/06, 73 y.o.   MRN: [id number]  HPI 73 year old female with history of right MCA distribution infarct causing chronic left spastic hemiplegia onset 06/03/2014.  Patient has been primarily wheelchair-bound since that time.  She requires assistance for her ADLs and mobility.  Her family has a difficult time washing underneath her left armpit.  Patient also complains of pain with fingers being stretched. She underwent botulinum toxin injection 6 weeks ago she notes and her daughter notes improvements in her tone.  Still the primary issue is her shoulder Pain Inventory Average Pain 1 Pain Right Now 1 My pain is sharp  In the last 24 hours, has pain interfered with the following? General activity 0 Relation with others 0 Enjoyment of life 0 What TIME of day is your pain at its worst? na Sleep (in general) Good  Pain is worse with: na Pain improves with: na Relief from Meds: na  Mobility walk with assistance use a wheelchair  Function retired I need assistance with the following:  dressing, bathing, toileting, meal prep, household duties and shopping  Neuro/Psych No problems in this area  Prior Studies Any changes since last visit?  no  Physicians involved in your care Any changes since last visit?  no   Family History  Problem Relation Age of Onset  . Coronary artery disease Mother 11       MI  . Kidney disease Father   . Breast cancer Daughter   . Stomach cancer Other   . Colon cancer Neg Hx   . Liver cancer Neg Hx   . Colon polyps Neg Hx   . Esophageal cancer Neg Hx   . Rectal cancer Neg Hx    Social History   Socioeconomic History  . Marital status: Married    Spouse name: Not on file  . Number of children: 1  . Years of education: Not on file  . Highest education level: Not on file  Occupational History  . Occupation: RETIRED  Social Needs  . Financial resource strain: Not on file  . Food  insecurity    Worry: Not on file    Inability: Not on file  . Transportation needs    Medical: Not on file    Non-medical: Not on file  Tobacco Use  . Smoking status: Former Smoker    Years: 5.00  . Smokeless tobacco: Never Used  . Tobacco comment: quit smoking 40 years ago  Substance and Sexual Activity  . Alcohol use: Yes    Alcohol/week: 1.0 standard drinks    Types: 1 Glasses of wine per week    Comment: occasionally wine  . Drug use: No  . Sexual activity: Not on file  Lifestyle  . Physical activity    Days per week: Not on file    Minutes per session: Not on file  . Stress: Not on file  Relationships  . Social Herbalist on phone: Not on file    Gets together: Not on file    Attends religious service: Not on file    Active member of club or organization: Not on file    Attends meetings of clubs or organizations: Not on file    Relationship status: Not on file  Other Topics Concern  . Not on file  Social History Narrative   Lives in [place]    Married. Education: [employer].    Past Surgical  History:  Procedure Laterality Date  . APPENDECTOMY    . HEMORROIDECTOMY    . IR RADIOLOGY PERIPHERAL GUIDED IV START  06/18/2017  . IR RADIOLOGY PERIPHERAL GUIDED IV START  07/09/2018  . IR US GUIDE VASC ACCESS RIGHT  06/18/2017  . IR US GUIDE VASC ACCESS RIGHT  07/09/2018  . LOOP RECORDER IMPLANT  06/07/2014   MDT LINQ implanted by Dr [name] for cryptogenic stroke  . LOOP RECORDER IMPLANT N/A 06/07/2014   Procedure: LOOP RECORDER IMPLANT;  Surgeon: [name], MD;  Location: [facility]ATH [facility];  Service: Cardiovascular;  Laterality: N/A;  . RADIOLOGY WITH ANESTHESIA N/A 06/03/2014   Procedure: RADIOLOGY WITH ANESTHESIA;  Surgeon: [name], MD;  Location: [facility];  Service: Radiology;  Laterality: N/A;  . TEE WITHOUT CARDIOVERSION N/A 06/07/2014   Procedure: TRANSESOPHAGEAL ECHOCARDIOGRAM (TEE);  Surgeon: [name], MD;  Location: [facility] ENDOSCOPY;   Service: Cardiovascular;  Laterality: N/A;  . TONSILLECTOMY    . TUBAL LIGATION     Past Medical History:  Diagnosis Date  . Allergy   . Anxiety   . Arthritis    back  . Back pain    arthritis  . Clotting disorder ([place])   . CVA (cerebral infarction)   . Depression   . Fall 11/2014   fell in bathroom, uses a cane  . Hypercholesteremia   . Hypertension   . Joint pain    back, history of   . Stroke ([place])   . Weakness    There were no vitals taken for this visit.  Opioid Risk Score:   Fall Risk Score:  `1  Depression screen PHQ 2/9  Depression screen [facility] 2/9 12/31/2018 03/25/2018 02/26/2018 01/08/2018 08/25/2017 01/09/2017 12/05/2016  Decreased Interest 2 0 1 1 1  0 0  Down, Depressed, Hopeless 2 2 1 1 1  0 0  PHQ - 2 Score 4 2 2 2 2  0 0  Altered sleeping - 0 - - - - -  Tired, decreased energy - 3 - - - - -  Change in appetite - 0 - - - - -  Feeling bad or failure about yourself  - 1 - - - - -  Trouble concentrating - 0 - - - - -  Moving slowly or fidgety/restless - 0 - - - - -  Suicidal thoughts - 0 - - - - -  PHQ-9 Score - 6 - - - - -  Some recent data might be hidden     Review of Systems  Constitutional: Negative.   HENT: Negative.   Eyes: Negative.   Respiratory: Negative.   Cardiovascular: Negative.   Gastrointestinal: Negative.   Endocrine: Negative.   Genitourinary: Negative.   Musculoskeletal: Positive for gait problem.  Skin: Negative.   Allergic/Immunologic: Negative.   Hematological: Negative.   Psychiatric/Behavioral: Negative.   All other systems reviewed and are negative.      Objective:   Physical Exam  Letter strength is trace shoulder flexion as well as elbow flexion.  0 at the finger flexors and extensors.  0 at the wrist flexors and extensors.  Left lower extremity has 3 - at the hip flexors and knee extensors 0 at the ankle dorsiflexors MAS 3 at the left pectoralis MAS 2 at the left elbow flexors MAS 0 at the left wrist flexors a MAS 2 at the  left finger flexors Patient has pain with passive extension of the fingers of the left hand.  No pain with elbow  range of motion there is pain with shoulder abduction and external rotation with increased tone noted.  Tone in the lower extremity is increased with plantar flexor and foot inverters MAS 3/4    Assessment & Plan:  1.  Left spastic hemiplegia due to CVA  Still has elevated tone at L shoulder adductors will increase pectoralis dose an d/c pronator injections  Pectoralis200 Brachioradialis25 Biceps 75 FDS50 FDP50  2.  Equinovarus spasticity LLE-we discussed that this has been longstanding and possibly can be contracture.  We also discussed that she already is using maximum dose of Botox for the left upper extremity.  Would recommend left tibial nerve block with Marcaine to evaluate whether she has mainly underlying contracture or much of her problem the spasticity.  If this gives her a good temporary reduction in her equinovarus positioning, would proceed with phenol injection to the same nerves followed by outpatient PT

## 2019-06-08 ENCOUNTER — Other Ambulatory Visit: Payer: Self-pay

## 2019-06-08 ENCOUNTER — Ambulatory Visit
Admission: RE | Admit: 2019-06-08 | Discharge: 2019-06-08 | Disposition: A | Discharge status: Home / Self Care | Payer: Medicare Other | Source: Ambulatory Visit | Attending: Internal Medicine | Admitting: Internal Medicine

## 2019-06-08 DIAGNOSIS — Z1231 Encounter for screening mammogram for malignant neoplasm of breast: Secondary | ICD-10-CM

## 2019-06-22 ENCOUNTER — Encounter: Payer: Medicare Other | Admitting: Physical Medicine & Rehabilitation

## 2019-07-06 ENCOUNTER — Other Ambulatory Visit (HOSPITAL_COMMUNITY): Payer: Self-pay | Admitting: Interventional Radiology

## 2019-07-06 DIAGNOSIS — I639 Cerebral infarction, unspecified: Secondary | ICD-10-CM

## 2019-07-26 ENCOUNTER — Ambulatory Visit (HOSPITAL_COMMUNITY): Payer: Medicare Other

## 2019-08-14 DIAGNOSIS — K5909 Other constipation: Secondary | ICD-10-CM | POA: Diagnosis not present

## 2019-08-14 DIAGNOSIS — I6930 Unspecified sequelae of cerebral infarction: Secondary | ICD-10-CM | POA: Diagnosis not present

## 2019-08-14 DIAGNOSIS — Z7902 Long term (current) use of antithrombotics/antiplatelets: Secondary | ICD-10-CM | POA: Diagnosis not present

## 2019-08-14 DIAGNOSIS — Z0000 Encounter for general adult medical examination without abnormal findings: Secondary | ICD-10-CM | POA: Diagnosis not present

## 2019-08-14 DIAGNOSIS — I10 Essential (primary) hypertension: Secondary | ICD-10-CM | POA: Diagnosis not present

## 2019-08-23 ENCOUNTER — Ambulatory Visit (HOSPITAL_COMMUNITY): Admission: RE | Admit: 2019-08-23 | Payer: Medicare Other | Source: Ambulatory Visit

## 2019-08-23 ENCOUNTER — Encounter (HOSPITAL_COMMUNITY): Payer: Self-pay

## 2019-08-23 ENCOUNTER — Inpatient Hospital Stay (HOSPITAL_COMMUNITY): Admission: RE | Admit: 2019-08-23 | Payer: Medicare Other | Source: Ambulatory Visit

## 2019-08-24 ENCOUNTER — Telehealth (HOSPITAL_COMMUNITY): Payer: Self-pay

## 2019-08-24 NOTE — Telephone Encounter (Signed)
Daughter called to cancel her ct scan. They came in contact with 3 people that were COVID+. She will call back to reschedule when they are clear. AW

## 2019-09-17 DIAGNOSIS — M79675 Pain in left toe(s): Secondary | ICD-10-CM | POA: Diagnosis not present

## 2019-09-17 DIAGNOSIS — B351 Tinea unguium: Secondary | ICD-10-CM | POA: Diagnosis not present

## 2019-09-17 DIAGNOSIS — M2041 Other hammer toe(s) (acquired), right foot: Secondary | ICD-10-CM | POA: Diagnosis not present

## 2019-09-17 DIAGNOSIS — E1151 Type 2 diabetes mellitus with diabetic peripheral angiopathy without gangrene: Secondary | ICD-10-CM | POA: Diagnosis not present

## 2019-09-17 DIAGNOSIS — L84 Corns and callosities: Secondary | ICD-10-CM | POA: Diagnosis not present

## 2019-09-27 DIAGNOSIS — Z7902 Long term (current) use of antithrombotics/antiplatelets: Secondary | ICD-10-CM | POA: Diagnosis not present

## 2019-09-27 DIAGNOSIS — I10 Essential (primary) hypertension: Secondary | ICD-10-CM | POA: Diagnosis not present

## 2019-09-27 DIAGNOSIS — N319 Neuromuscular dysfunction of bladder, unspecified: Secondary | ICD-10-CM | POA: Diagnosis not present

## 2019-09-27 DIAGNOSIS — K5909 Other constipation: Secondary | ICD-10-CM | POA: Diagnosis not present

## 2019-11-10 DIAGNOSIS — I6930 Unspecified sequelae of cerebral infarction: Secondary | ICD-10-CM | POA: Diagnosis not present

## 2019-11-10 DIAGNOSIS — Z131 Encounter for screening for diabetes mellitus: Secondary | ICD-10-CM | POA: Diagnosis not present

## 2019-11-10 DIAGNOSIS — Z1329 Encounter for screening for other suspected endocrine disorder: Secondary | ICD-10-CM | POA: Diagnosis not present

## 2019-11-20 DIAGNOSIS — I10 Essential (primary) hypertension: Secondary | ICD-10-CM | POA: Diagnosis not present

## 2019-11-20 DIAGNOSIS — N319 Neuromuscular dysfunction of bladder, unspecified: Secondary | ICD-10-CM | POA: Diagnosis not present

## 2019-11-20 DIAGNOSIS — Z7902 Long term (current) use of antithrombotics/antiplatelets: Secondary | ICD-10-CM | POA: Diagnosis not present

## 2019-11-20 DIAGNOSIS — E785 Hyperlipidemia, unspecified: Secondary | ICD-10-CM | POA: Diagnosis not present

## 2019-12-30 ENCOUNTER — Telehealth: Payer: Self-pay | Admitting: Physical Medicine & Rehabilitation

## 2019-12-30 NOTE — Telephone Encounter (Signed)
Patient's daughter called to set up Botox Injections for her mother.  It looks like she hasn't had them since Sept of last year.  I have made patient a follow up with Dr. Letta Pate on Tuesday 01/04/20.  Can patient have Botox at this appt? Please advise.

## 2019-12-30 NOTE — Telephone Encounter (Signed)
Set up for Botox 400U G81.10, 88875, [phone]

## 2020-01-04 ENCOUNTER — Ambulatory Visit: Payer: Medicare Other | Admitting: Physical Medicine & Rehabilitation

## 2020-01-06 ENCOUNTER — Telehealth: Payer: Self-pay | Admitting: *Deleted

## 2020-01-06 ENCOUNTER — Other Ambulatory Visit: Payer: Self-pay

## 2020-01-06 ENCOUNTER — Encounter: Payer: Self-pay | Admitting: Physical Medicine & Rehabilitation

## 2020-01-06 ENCOUNTER — Encounter: Payer: Medicare Other | Attending: Physical Medicine & Rehabilitation | Admitting: Physical Medicine & Rehabilitation

## 2020-01-06 VITALS — BP 151/83 | HR 56 | Temp 98.2°F | Ht 63.0 in

## 2020-01-06 DIAGNOSIS — G8114 Spastic hemiplegia affecting left nondominant side: Secondary | ICD-10-CM | POA: Diagnosis not present

## 2020-01-06 DIAGNOSIS — G8110 Spastic hemiplegia affecting unspecified side: Secondary | ICD-10-CM

## 2020-01-06 NOTE — Patient Instructions (Signed)

## 2020-01-06 NOTE — Progress Notes (Signed)
Botox Injection for spasticity using needle EMG guidance  Dilution: 50 Units/ml Indication: Severe spasticity which interferes with ADL,mobility and/or  hygiene and is unresponsive to medication management and other conservative care Informed consent was obtained after describing risks and benefits of the procedure with the patient. This includes bleeding, bruising, infection, excessive weakness, or medication side effects. A REMS form is on file and signed. Needle: 27g 1" needle electrode Number of units per muscle  Pectoralis200 Brachioradialis25 Biceps 75 FDS50 FDP50 All injections were done after obtaining appropriate EMG activity and after negative drawback for blood. The patient tolerated the procedure well. Post procedure instructions were given. A followup appointment was made.

## 2020-02-03 NOTE — Telephone Encounter (Signed)
error 

## 2020-02-04 ENCOUNTER — Ambulatory Visit: Payer: Medicare Other | Admitting: Rehabilitative and Restorative Service Providers"

## 2020-02-14 ENCOUNTER — Ambulatory Visit: Payer: Medicare Other

## 2020-02-17 ENCOUNTER — Encounter: Payer: Medicare Other | Admitting: Physical Medicine & Rehabilitation

## 2020-02-19 DIAGNOSIS — I10 Essential (primary) hypertension: Secondary | ICD-10-CM | POA: Diagnosis not present

## 2020-02-19 DIAGNOSIS — K5909 Other constipation: Secondary | ICD-10-CM | POA: Diagnosis not present

## 2020-02-19 DIAGNOSIS — G8190 Hemiplegia, unspecified affecting unspecified side: Secondary | ICD-10-CM | POA: Diagnosis not present

## 2020-02-19 DIAGNOSIS — R2689 Other abnormalities of gait and mobility: Secondary | ICD-10-CM | POA: Diagnosis not present

## 2020-02-24 ENCOUNTER — Telehealth (HOSPITAL_COMMUNITY): Payer: Self-pay

## 2020-02-24 ENCOUNTER — Ambulatory Visit (HOSPITAL_COMMUNITY)
Admission: RE | Admit: 2020-02-24 | Discharge: 2020-02-24 | Disposition: A | Discharge status: Home / Self Care | Payer: Medicare Other | Source: Ambulatory Visit | Attending: Interventional Radiology | Admitting: Interventional Radiology

## 2020-02-24 ENCOUNTER — Encounter (HOSPITAL_COMMUNITY): Payer: Self-pay

## 2020-02-24 ENCOUNTER — Other Ambulatory Visit: Payer: Self-pay

## 2020-02-24 DIAGNOSIS — I639 Cerebral infarction, unspecified: Secondary | ICD-10-CM

## 2020-02-24 DIAGNOSIS — I63411 Cerebral infarction due to embolism of right middle cerebral artery: Secondary | ICD-10-CM | POA: Diagnosis not present

## 2020-02-24 LAB — POCT I-STAT CREATININE: Creatinine, Ser: 1 mg/dL (ref 0.44–1.00)

## 2020-02-24 MED ORDER — IOHEXOL 350 MG/ML SOLN
80.0000 mL | Freq: Once | INTRAVENOUS | Status: AC | PRN
  Administered 2020-02-24: 80 mL via INTRAVENOUS

## 2020-02-24 NOTE — Telephone Encounter (Signed)
Daughter called to confirm appt and ask about restrictions. All information was given. AW

## 2020-02-29 ENCOUNTER — Telehealth (HOSPITAL_COMMUNITY): Payer: Self-pay

## 2020-02-29 NOTE — Telephone Encounter (Signed)
Called pt's daughter about recent cta. Stable per Dr. Estanislado Pandy and no further f/u needed with NIR. She still should continue f/u with neurology for further management. Daughter agreed with this plan. AW

## 2020-03-02 DIAGNOSIS — K219 Gastro-esophageal reflux disease without esophagitis: Secondary | ICD-10-CM | POA: Diagnosis not present

## 2020-03-02 DIAGNOSIS — Z6833 Body mass index (BMI) 33.0-33.9, adult: Secondary | ICD-10-CM | POA: Diagnosis not present

## 2020-03-02 DIAGNOSIS — I69354 Hemiplegia and hemiparesis following cerebral infarction affecting left non-dominant side: Secondary | ICD-10-CM | POA: Diagnosis not present

## 2020-03-02 DIAGNOSIS — I639 Cerebral infarction, unspecified: Secondary | ICD-10-CM | POA: Diagnosis not present

## 2020-03-02 DIAGNOSIS — E785 Hyperlipidemia, unspecified: Secondary | ICD-10-CM | POA: Diagnosis not present

## 2020-03-02 DIAGNOSIS — M47816 Spondylosis without myelopathy or radiculopathy, lumbar region: Secondary | ICD-10-CM | POA: Diagnosis not present

## 2020-03-02 DIAGNOSIS — K5909 Other constipation: Secondary | ICD-10-CM | POA: Diagnosis not present

## 2020-03-02 DIAGNOSIS — Z87891 Personal history of nicotine dependence: Secondary | ICD-10-CM | POA: Diagnosis not present

## 2020-03-02 DIAGNOSIS — R32 Unspecified urinary incontinence: Secondary | ICD-10-CM | POA: Diagnosis not present

## 2020-03-02 DIAGNOSIS — I10 Essential (primary) hypertension: Secondary | ICD-10-CM | POA: Diagnosis not present

## 2020-03-02 DIAGNOSIS — Z87440 Personal history of urinary (tract) infections: Secondary | ICD-10-CM | POA: Diagnosis not present

## 2020-03-02 DIAGNOSIS — Z9181 History of falling: Secondary | ICD-10-CM | POA: Diagnosis not present

## 2020-03-02 DIAGNOSIS — M25561 Pain in right knee: Secondary | ICD-10-CM | POA: Diagnosis not present

## 2020-03-02 DIAGNOSIS — Z7982 Long term (current) use of aspirin: Secondary | ICD-10-CM | POA: Diagnosis not present

## 2020-03-06 DIAGNOSIS — K5909 Other constipation: Secondary | ICD-10-CM | POA: Diagnosis not present

## 2020-03-06 DIAGNOSIS — I69354 Hemiplegia and hemiparesis following cerebral infarction affecting left non-dominant side: Secondary | ICD-10-CM | POA: Diagnosis not present

## 2020-03-06 DIAGNOSIS — I10 Essential (primary) hypertension: Secondary | ICD-10-CM | POA: Diagnosis not present

## 2020-03-06 DIAGNOSIS — M25561 Pain in right knee: Secondary | ICD-10-CM | POA: Diagnosis not present

## 2020-03-06 DIAGNOSIS — M47816 Spondylosis without myelopathy or radiculopathy, lumbar region: Secondary | ICD-10-CM | POA: Diagnosis not present

## 2020-03-08 DIAGNOSIS — I10 Essential (primary) hypertension: Secondary | ICD-10-CM | POA: Diagnosis not present

## 2020-03-08 DIAGNOSIS — M25561 Pain in right knee: Secondary | ICD-10-CM | POA: Diagnosis not present

## 2020-03-08 DIAGNOSIS — K5909 Other constipation: Secondary | ICD-10-CM | POA: Diagnosis not present

## 2020-03-08 DIAGNOSIS — I69354 Hemiplegia and hemiparesis following cerebral infarction affecting left non-dominant side: Secondary | ICD-10-CM | POA: Diagnosis not present

## 2020-03-08 DIAGNOSIS — M47816 Spondylosis without myelopathy or radiculopathy, lumbar region: Secondary | ICD-10-CM | POA: Diagnosis not present

## 2020-03-15 DIAGNOSIS — K5909 Other constipation: Secondary | ICD-10-CM | POA: Diagnosis not present

## 2020-03-15 DIAGNOSIS — I69354 Hemiplegia and hemiparesis following cerebral infarction affecting left non-dominant side: Secondary | ICD-10-CM | POA: Diagnosis not present

## 2020-03-15 DIAGNOSIS — I10 Essential (primary) hypertension: Secondary | ICD-10-CM | POA: Diagnosis not present

## 2020-03-15 DIAGNOSIS — M47816 Spondylosis without myelopathy or radiculopathy, lumbar region: Secondary | ICD-10-CM | POA: Diagnosis not present

## 2020-03-15 DIAGNOSIS — M25561 Pain in right knee: Secondary | ICD-10-CM | POA: Diagnosis not present

## 2020-03-16 DIAGNOSIS — M25561 Pain in right knee: Secondary | ICD-10-CM | POA: Diagnosis not present

## 2020-03-16 DIAGNOSIS — I69354 Hemiplegia and hemiparesis following cerebral infarction affecting left non-dominant side: Secondary | ICD-10-CM | POA: Diagnosis not present

## 2020-03-16 DIAGNOSIS — K5909 Other constipation: Secondary | ICD-10-CM | POA: Diagnosis not present

## 2020-03-16 DIAGNOSIS — M47816 Spondylosis without myelopathy or radiculopathy, lumbar region: Secondary | ICD-10-CM | POA: Diagnosis not present

## 2020-03-16 DIAGNOSIS — I10 Essential (primary) hypertension: Secondary | ICD-10-CM | POA: Diagnosis not present

## 2020-03-20 DIAGNOSIS — M25561 Pain in right knee: Secondary | ICD-10-CM | POA: Diagnosis not present

## 2020-03-20 DIAGNOSIS — M47816 Spondylosis without myelopathy or radiculopathy, lumbar region: Secondary | ICD-10-CM | POA: Diagnosis not present

## 2020-03-20 DIAGNOSIS — I69354 Hemiplegia and hemiparesis following cerebral infarction affecting left non-dominant side: Secondary | ICD-10-CM | POA: Diagnosis not present

## 2020-03-20 DIAGNOSIS — K5909 Other constipation: Secondary | ICD-10-CM | POA: Diagnosis not present

## 2020-03-20 DIAGNOSIS — I10 Essential (primary) hypertension: Secondary | ICD-10-CM | POA: Diagnosis not present

## 2020-03-22 DIAGNOSIS — I10 Essential (primary) hypertension: Secondary | ICD-10-CM | POA: Diagnosis not present

## 2020-03-22 DIAGNOSIS — I69354 Hemiplegia and hemiparesis following cerebral infarction affecting left non-dominant side: Secondary | ICD-10-CM | POA: Diagnosis not present

## 2020-03-22 DIAGNOSIS — M47816 Spondylosis without myelopathy or radiculopathy, lumbar region: Secondary | ICD-10-CM | POA: Diagnosis not present

## 2020-03-22 DIAGNOSIS — M25561 Pain in right knee: Secondary | ICD-10-CM | POA: Diagnosis not present

## 2020-03-22 DIAGNOSIS — K5909 Other constipation: Secondary | ICD-10-CM | POA: Diagnosis not present

## 2020-03-24 DIAGNOSIS — I10 Essential (primary) hypertension: Secondary | ICD-10-CM | POA: Diagnosis not present

## 2020-03-24 DIAGNOSIS — K5909 Other constipation: Secondary | ICD-10-CM | POA: Diagnosis not present

## 2020-03-24 DIAGNOSIS — M47816 Spondylosis without myelopathy or radiculopathy, lumbar region: Secondary | ICD-10-CM | POA: Diagnosis not present

## 2020-03-24 DIAGNOSIS — I69354 Hemiplegia and hemiparesis following cerebral infarction affecting left non-dominant side: Secondary | ICD-10-CM | POA: Diagnosis not present

## 2020-03-24 DIAGNOSIS — M25561 Pain in right knee: Secondary | ICD-10-CM | POA: Diagnosis not present

## 2020-03-31 DIAGNOSIS — I69354 Hemiplegia and hemiparesis following cerebral infarction affecting left non-dominant side: Secondary | ICD-10-CM | POA: Diagnosis not present

## 2020-03-31 DIAGNOSIS — M47816 Spondylosis without myelopathy or radiculopathy, lumbar region: Secondary | ICD-10-CM | POA: Diagnosis not present

## 2020-03-31 DIAGNOSIS — M25561 Pain in right knee: Secondary | ICD-10-CM | POA: Diagnosis not present

## 2020-03-31 DIAGNOSIS — K5909 Other constipation: Secondary | ICD-10-CM | POA: Diagnosis not present

## 2020-03-31 DIAGNOSIS — I10 Essential (primary) hypertension: Secondary | ICD-10-CM | POA: Diagnosis not present

## 2020-04-01 DIAGNOSIS — R32 Unspecified urinary incontinence: Secondary | ICD-10-CM | POA: Diagnosis not present

## 2020-04-01 DIAGNOSIS — Z6833 Body mass index (BMI) 33.0-33.9, adult: Secondary | ICD-10-CM | POA: Diagnosis not present

## 2020-04-01 DIAGNOSIS — Z7982 Long term (current) use of aspirin: Secondary | ICD-10-CM | POA: Diagnosis not present

## 2020-04-01 DIAGNOSIS — K219 Gastro-esophageal reflux disease without esophagitis: Secondary | ICD-10-CM | POA: Diagnosis not present

## 2020-04-01 DIAGNOSIS — Z9181 History of falling: Secondary | ICD-10-CM | POA: Diagnosis not present

## 2020-04-01 DIAGNOSIS — Z87440 Personal history of urinary (tract) infections: Secondary | ICD-10-CM | POA: Diagnosis not present

## 2020-04-01 DIAGNOSIS — Z87891 Personal history of nicotine dependence: Secondary | ICD-10-CM | POA: Diagnosis not present

## 2020-04-01 DIAGNOSIS — E785 Hyperlipidemia, unspecified: Secondary | ICD-10-CM | POA: Diagnosis not present

## 2020-04-01 DIAGNOSIS — I69354 Hemiplegia and hemiparesis following cerebral infarction affecting left non-dominant side: Secondary | ICD-10-CM | POA: Diagnosis not present

## 2020-04-01 DIAGNOSIS — M47816 Spondylosis without myelopathy or radiculopathy, lumbar region: Secondary | ICD-10-CM | POA: Diagnosis not present

## 2020-04-01 DIAGNOSIS — I10 Essential (primary) hypertension: Secondary | ICD-10-CM | POA: Diagnosis not present

## 2020-04-01 DIAGNOSIS — I639 Cerebral infarction, unspecified: Secondary | ICD-10-CM | POA: Diagnosis not present

## 2020-04-01 DIAGNOSIS — K5909 Other constipation: Secondary | ICD-10-CM | POA: Diagnosis not present

## 2020-04-01 DIAGNOSIS — M25561 Pain in right knee: Secondary | ICD-10-CM | POA: Diagnosis not present

## 2020-04-06 DIAGNOSIS — K5909 Other constipation: Secondary | ICD-10-CM | POA: Diagnosis not present

## 2020-04-06 DIAGNOSIS — M25561 Pain in right knee: Secondary | ICD-10-CM | POA: Diagnosis not present

## 2020-04-06 DIAGNOSIS — I69354 Hemiplegia and hemiparesis following cerebral infarction affecting left non-dominant side: Secondary | ICD-10-CM | POA: Diagnosis not present

## 2020-04-06 DIAGNOSIS — M47816 Spondylosis without myelopathy or radiculopathy, lumbar region: Secondary | ICD-10-CM | POA: Diagnosis not present

## 2020-04-06 DIAGNOSIS — I10 Essential (primary) hypertension: Secondary | ICD-10-CM | POA: Diagnosis not present

## 2020-04-11 DIAGNOSIS — I10 Essential (primary) hypertension: Secondary | ICD-10-CM | POA: Diagnosis not present

## 2020-04-11 DIAGNOSIS — M47816 Spondylosis without myelopathy or radiculopathy, lumbar region: Secondary | ICD-10-CM | POA: Diagnosis not present

## 2020-04-11 DIAGNOSIS — I69354 Hemiplegia and hemiparesis following cerebral infarction affecting left non-dominant side: Secondary | ICD-10-CM | POA: Diagnosis not present

## 2020-04-11 DIAGNOSIS — K5909 Other constipation: Secondary | ICD-10-CM | POA: Diagnosis not present

## 2020-04-11 DIAGNOSIS — M25561 Pain in right knee: Secondary | ICD-10-CM | POA: Diagnosis not present

## 2020-04-15 DIAGNOSIS — I10 Essential (primary) hypertension: Secondary | ICD-10-CM | POA: Diagnosis not present

## 2020-04-15 DIAGNOSIS — R2689 Other abnormalities of gait and mobility: Secondary | ICD-10-CM | POA: Diagnosis not present

## 2020-04-15 DIAGNOSIS — K5909 Other constipation: Secondary | ICD-10-CM | POA: Diagnosis not present

## 2020-04-15 DIAGNOSIS — Z7902 Long term (current) use of antithrombotics/antiplatelets: Secondary | ICD-10-CM | POA: Diagnosis not present

## 2020-05-26 DIAGNOSIS — M722 Plantar fascial fibromatosis: Secondary | ICD-10-CM | POA: Diagnosis not present

## 2020-05-26 DIAGNOSIS — N390 Urinary tract infection, site not specified: Secondary | ICD-10-CM | POA: Diagnosis not present

## 2020-05-26 DIAGNOSIS — I10 Essential (primary) hypertension: Secondary | ICD-10-CM | POA: Diagnosis not present

## 2020-05-26 DIAGNOSIS — K5909 Other constipation: Secondary | ICD-10-CM | POA: Diagnosis not present

## 2020-06-22 ENCOUNTER — Telehealth: Payer: Self-pay | Admitting: Physical Medicine & Rehabilitation

## 2020-06-22 NOTE — Telephone Encounter (Signed)
Schedule Botox to the left pectoralis as well as left upper limb, G 81.10, 64646, 64644, 400 units

## 2020-06-22 NOTE — Telephone Encounter (Signed)
Patient's daughter wanted to schedule a Botox injection for her mom, but it looks like she hasn't been in clinic since June and canceled her follow up appt in July with you.  Please advise if we can schedule.

## 2020-07-27 ENCOUNTER — Encounter: Payer: Medicare Other | Admitting: Physical Medicine & Rehabilitation

## 2020-08-10 ENCOUNTER — Encounter: Payer: Medicare Other | Admitting: Physical Medicine & Rehabilitation

## 2020-08-22 ENCOUNTER — Other Ambulatory Visit: Payer: Self-pay | Admitting: Internal Medicine

## 2020-08-22 DIAGNOSIS — N61 Mastitis without abscess: Secondary | ICD-10-CM

## 2020-09-16 ENCOUNTER — Encounter (HOSPITAL_COMMUNITY): Payer: Self-pay | Admitting: Emergency Medicine

## 2020-09-16 ENCOUNTER — Emergency Department (HOSPITAL_COMMUNITY): Payer: Medicare Other

## 2020-09-16 ENCOUNTER — Inpatient Hospital Stay (HOSPITAL_COMMUNITY)
Admission: EM | Admit: 2020-09-16 | Discharge: 2020-09-29 | DRG: 326 | Disposition: A | Discharge status: Skilled Nursing Facility | Payer: Medicare Other | Attending: Physician Assistant | Admitting: Physician Assistant

## 2020-09-16 DIAGNOSIS — K275 Chronic or unspecified peptic ulcer, site unspecified, with perforation: Secondary | ICD-10-CM

## 2020-09-16 DIAGNOSIS — I739 Peripheral vascular disease, unspecified: Secondary | ICD-10-CM | POA: Diagnosis present

## 2020-09-16 DIAGNOSIS — Z886 Allergy status to analgesic agent status: Secondary | ICD-10-CM

## 2020-09-16 DIAGNOSIS — Z9889 Other specified postprocedural states: Secondary | ICD-10-CM

## 2020-09-16 DIAGNOSIS — N179 Acute kidney failure, unspecified: Secondary | ICD-10-CM | POA: Diagnosis not present

## 2020-09-16 DIAGNOSIS — L89322 Pressure ulcer of left buttock, stage 2: Secondary | ICD-10-CM | POA: Diagnosis present

## 2020-09-16 DIAGNOSIS — Z87891 Personal history of nicotine dependence: Secondary | ICD-10-CM

## 2020-09-16 DIAGNOSIS — L8990 Pressure ulcer of unspecified site, unspecified stage: Secondary | ICD-10-CM | POA: Insufficient documentation

## 2020-09-16 DIAGNOSIS — E162 Hypoglycemia, unspecified: Secondary | ICD-10-CM | POA: Diagnosis not present

## 2020-09-16 DIAGNOSIS — Z7982 Long term (current) use of aspirin: Secondary | ICD-10-CM

## 2020-09-16 DIAGNOSIS — Z20822 Contact with and (suspected) exposure to covid-19: Secondary | ICD-10-CM | POA: Diagnosis present

## 2020-09-16 DIAGNOSIS — K650 Generalized (acute) peritonitis: Secondary | ICD-10-CM | POA: Diagnosis present

## 2020-09-16 DIAGNOSIS — I69312 Visuospatial deficit and spatial neglect following cerebral infarction: Secondary | ICD-10-CM

## 2020-09-16 DIAGNOSIS — M1990 Unspecified osteoarthritis, unspecified site: Secondary | ICD-10-CM | POA: Diagnosis present

## 2020-09-16 DIAGNOSIS — E785 Hyperlipidemia, unspecified: Secondary | ICD-10-CM | POA: Diagnosis present

## 2020-09-16 DIAGNOSIS — E876 Hypokalemia: Secondary | ICD-10-CM | POA: Diagnosis not present

## 2020-09-16 DIAGNOSIS — I10 Essential (primary) hypertension: Secondary | ICD-10-CM | POA: Diagnosis present

## 2020-09-16 DIAGNOSIS — K219 Gastro-esophageal reflux disease without esophagitis: Secondary | ICD-10-CM | POA: Diagnosis present

## 2020-09-16 DIAGNOSIS — R198 Other specified symptoms and signs involving the digestive system and abdomen: Secondary | ICD-10-CM | POA: Diagnosis present

## 2020-09-16 DIAGNOSIS — F419 Anxiety disorder, unspecified: Secondary | ICD-10-CM | POA: Diagnosis present

## 2020-09-16 DIAGNOSIS — Z79899 Other long term (current) drug therapy: Secondary | ICD-10-CM

## 2020-09-16 DIAGNOSIS — Z6834 Body mass index (BMI) 34.0-34.9, adult: Secondary | ICD-10-CM

## 2020-09-16 DIAGNOSIS — K255 Chronic or unspecified gastric ulcer with perforation: Secondary | ICD-10-CM | POA: Diagnosis not present

## 2020-09-16 DIAGNOSIS — Z888 Allergy status to other drugs, medicaments and biological substances status: Secondary | ICD-10-CM

## 2020-09-16 DIAGNOSIS — I69354 Hemiplegia and hemiparesis following cerebral infarction affecting left non-dominant side: Secondary | ICD-10-CM

## 2020-09-16 DIAGNOSIS — Z8249 Family history of ischemic heart disease and other diseases of the circulatory system: Secondary | ICD-10-CM

## 2020-09-16 DIAGNOSIS — F32A Depression, unspecified: Secondary | ICD-10-CM | POA: Diagnosis present

## 2020-09-16 DIAGNOSIS — E669 Obesity, unspecified: Secondary | ICD-10-CM | POA: Diagnosis present

## 2020-09-16 DIAGNOSIS — K5909 Other constipation: Secondary | ICD-10-CM | POA: Diagnosis present

## 2020-09-16 LAB — CBC
HCT: 44.8 % (ref 36.0–46.0)
Hemoglobin: 14 g/dL (ref 12.0–15.0)
MCH: 26.4 pg (ref 26.0–34.0)
MCHC: 31.3 g/dL (ref 30.0–36.0)
MCV: 84.5 fL (ref 80.0–100.0)
Platelets: 522 10*3/uL — ABNORMAL HIGH (ref 150–400)
RBC: 5.3 MIL/uL — ABNORMAL HIGH (ref 3.87–5.11)
RDW: 13.9 % (ref 11.5–15.5)
WBC: 15.8 10*3/uL — ABNORMAL HIGH (ref 4.0–10.5)
nRBC: 0 % (ref 0.0–0.2)

## 2020-09-16 LAB — COMPREHENSIVE METABOLIC PANEL
ALT: 15 U/L (ref 0–44)
AST: 19 U/L (ref 15–41)
Albumin: 3.2 g/dL — ABNORMAL LOW (ref 3.5–5.0)
Alkaline Phosphatase: 52 U/L (ref 38–126)
Anion gap: 12 (ref 5–15)
BUN: 17 mg/dL (ref 8–23)
CO2: 19 mmol/L — ABNORMAL LOW (ref 22–32)
Calcium: 9.1 mg/dL (ref 8.9–10.3)
Chloride: 103 mmol/L (ref 98–111)
Creatinine, Ser: 0.79 mg/dL (ref 0.44–1.00)
GFR, Estimated: >60 mL/min (ref >60)
Glucose, Bld: 141 mg/dL — ABNORMAL HIGH (ref 70–99)
Potassium: 4.4 mmol/L (ref 3.5–5.1)
Sodium: 134 mmol/L — ABNORMAL LOW (ref 135–145)
Total Bilirubin: 0.6 mg/dL (ref 0.3–1.2)
Total Protein: 7.1 g/dL (ref 6.5–8.1)

## 2020-09-16 LAB — PROTIME-INR
INR: 1 (ref 0.8–1.2)
Prothrombin Time: 13 seconds (ref 11.4–15.2)

## 2020-09-16 LAB — TROPONIN I (HIGH SENSITIVITY): Troponin I (High Sensitivity): 6 ng/L (ref <18)

## 2020-09-16 LAB — LACTIC ACID, PLASMA: Lactic Acid, Venous: 1.8 mmol/L (ref 0.5–1.9)

## 2020-09-16 LAB — LIPASE, BLOOD: Lipase: 122 U/L — ABNORMAL HIGH (ref 11–51)

## 2020-09-16 MED ORDER — HYDROMORPHONE HCL 1 MG/ML IJ SOLN
1.0000 mg | Freq: Once | INTRAMUSCULAR | Status: AC
  Administered 2020-09-16: 1 mg via INTRAVENOUS
  Filled 2020-09-16: qty 1

## 2020-09-16 MED ORDER — FENTANYL CITRATE (PF) 100 MCG/2ML IJ SOLN
50.0000 ug | Freq: Once | INTRAMUSCULAR | Status: AC
  Administered 2020-09-16: 50 ug via INTRAVENOUS
  Filled 2020-09-16: qty 2

## 2020-09-16 MED ORDER — IOHEXOL 300 MG/ML  SOLN
100.0000 mL | Freq: Once | INTRAMUSCULAR | Status: AC | PRN
  Administered 2020-09-16: 100 mL via INTRAVENOUS

## 2020-09-16 MED ORDER — ONDANSETRON HCL 4 MG/2ML IJ SOLN
4.0000 mg | Freq: Once | INTRAMUSCULAR | Status: AC
  Administered 2020-09-16: 4 mg via INTRAVENOUS
  Filled 2020-09-16: qty 2

## 2020-09-16 MED ORDER — SODIUM CHLORIDE 0.9 % IV BOLUS
500.0000 mL | Freq: Once | INTRAVENOUS | Status: AC
  Administered 2020-09-16: 500 mL via INTRAVENOUS

## 2020-09-16 MED ORDER — HYDROMORPHONE HCL 1 MG/ML IJ SOLN
1.0000 mg | Freq: Once | INTRAMUSCULAR | Status: AC
  Administered 2020-09-17: 1 mg via INTRAVENOUS
  Filled 2020-09-16: qty 1

## 2020-09-16 NOTE — ED Triage Notes (Signed)
Pt arrived via [facility] from home for severe abd pain x 1 hour, general discomfort x 2 days, pt moaning loudly, appears pale.  Pt reports pain now radiating to back and shoulder.

## 2020-09-16 NOTE — ED Provider Notes (Signed)
[facility] EMERGENCY DEPARTMENT Provider Note   CSN: [id number] Arrival date & time: 09/16/20  2134     History Chief Complaint  Patient presents with   Abdominal Pain    Mary Le is a 75 y.o. female.  She has a history of hypertension stroke.  Complaining of acute onset of right-sided abdominal pain rating from the right lower quadrant to the right upper quadrant into her shoulder and back.  Started 3 hours ago.  Severe in nature.  Sometimes makes her feel short of breath.  Nausea no vomiting.  Chronic constipation.  History of appendectomy.  No known fevers or chills.  The history is provided by the patient.  Abdominal Pain Pain location:  RUQ and RLQ Pain quality: squeezing   Pain radiates to:  R shoulder and back Pain severity:  Severe Onset quality:  Sudden Timing:  Constant Progression:  Unchanged Chronicity:  New Context: not trauma   Relieved by:  Nothing Worsened by:  Nothing Ineffective treatments:  None tried Associated symptoms: chest pain, nausea and shortness of breath   Associated symptoms: no cough, no diarrhea, no dysuria, no fever, no hematemesis, no hematochezia, no hematuria, no sore throat and no vomiting        Past Medical History:  Diagnosis Date   Allergy    Anxiety    Arthritis    back   Back pain    arthritis   Clotting disorder (HCC)    CVA (cerebral infarction)    Depression    Fall 11/2014   fell in bathroom, uses a cane   Hypercholesteremia    Hypertension    Joint pain    back, history of    Stroke ([place])    Weakness     Patient Active Problem List   Diagnosis Date Noted   Peripheral arterial disease ([place]) 09/02/2018   HTN (hypertension) 07/26/2015   Obesity (BMI 30.0-34.9) 07/26/2015   Syncope 12/06/2014   Spastic hemiplegia affecting nondominant side ([place]) 07/25/2014   Spondylosis of lumbar region without myelopathy or radiculopathy 06/20/2014   Left hemiparesis ([place])  06/10/2014   Left-sided neglect 06/10/2014   Thrombotic stroke involving right middle cerebral artery ([place]) 06/08/2014   Acute respiratory failure with hypoxia (HCC) 06/05/2014   Cerebral infarction due to occlusion of right middle cerebral artery ([place]) 06/03/2014   Cerebral infarct ([place]) 06/03/2014   Altered mental status 06/03/2014   GERD (gastroesophageal reflux disease) 04/12/2014   Dyslipidemia- statin intol 02/07/2014   Chest pain- Low risk Myoview July 2015 02/06/2014    Past Surgical History:  Procedure Laterality Date   APPENDECTOMY     HEMORROIDECTOMY     IR RADIOLOGY PERIPHERAL GUIDED IV START  06/18/2017   IR RADIOLOGY PERIPHERAL GUIDED IV START  07/09/2018   IR US GUIDE VASC ACCESS RIGHT  06/18/2017   IR US GUIDE VASC ACCESS RIGHT  07/09/2018   LOOP RECORDER IMPLANT  06/07/2014   MDT LINQ implanted by Dr [name] for cryptogenic stroke   LOOP RECORDER IMPLANT N/A 06/07/2014   Procedure: LOOP RECORDER IMPLANT;  Surgeon: [name], MD;  Location: [facility] CATH [facility];  Service: Cardiovascular;  Laterality: N/A;   RADIOLOGY WITH ANESTHESIA N/A 06/03/2014   Procedure: RADIOLOGY WITH ANESTHESIA;  Surgeon: [name], MD;  Location: [facility];  Service: Radiology;  Laterality: N/A;   TEE WITHOUT CARDIOVERSION N/A 06/07/2014   Procedure: TRANSESOPHAGEAL ECHOCARDIOGRAM (TEE);  Surgeon: [name], MD;  Location: [facility];  Service: Cardiovascular;  Laterality: N/A;   TONSILLECTOMY     TUBAL LIGATION       OB History   No obstetric history on file.     Family History  Problem Relation Age of Onset   Coronary artery disease Mother 64       MI   Kidney disease Father    Breast cancer Daughter    Stomach cancer Other    Colon cancer Neg Hx    Liver cancer Neg Hx    Colon polyps Neg Hx    Esophageal cancer Neg Hx    Rectal cancer Neg Hx     Social History   Tobacco Use   Smoking status: Former Smoker    Years: 5.00    Smokeless tobacco: Never Used   Tobacco comment: quit smoking 40 years ago  Vaping Use   Vaping Use: Never used  Substance Use Topics   Alcohol use: Yes    Alcohol/week: 1.0 standard drink    Types: 1 Glasses of wine per week    Comment: occasionally wine   Drug use: No    Home Medications Prior to Admission medications   Medication Sig Start Date End Date Taking? Authorizing Provider  acetaminophen (TYLENOL) 325 MG tablet Take 650 mg by mouth every 6 (six) hours as needed (pain).     [provider]  aspirin 81 MG tablet Take 81 mg by mouth daily.    [provider]  Evolocumab (REPATHA SURECLICK) 979 MG/ML SOAJ Inject 140 mg into the skin every 14 (fourteen) days. 09/14/18   Lorretta Harp, MD  famotidine (PEPCID) 10 MG tablet Take 1 tablet (10 mg total) by mouth 2 (two) times daily. Patient taking differently: Take 10 mg by mouth daily as needed for heartburn or indigestion.  06/23/14   Love, [name], PA-C  loratadine (ALAVERT) 10 MG tablet Take 10 mg by mouth daily as needed for allergies.    [provider]  losartan (COZAAR) 100 MG tablet Take 100 mg by mouth daily.    [provider]  metoprolol succinate (TOPROL-XL) 25 MG 24 hr tablet Take 12.5 mg by mouth daily.    [provider]  Multiple Vitamin (MULTIVITAMIN WITH MINERALS) TABS tablet Take 1 tablet by mouth daily. Reported on 11/22/2015    [provider]  Polyethyl Glycol-Propyl Glycol (SYSTANE OP) Place 1 drop into both eyes daily as needed (dry eyes).     [provider]  polyethylene glycol powder (GLYCOLAX/MIRALAX) powder Take 17 g by mouth See admin instructions. AS NEEDED EVERY 3-4 DAYS FOR CONSTIPATION    [provider]  senna-docusate (SENOKOT-S) 8.6-50 MG per tablet Take 3 tablets by mouth 2 (two) times daily. Patient taking differently: Take 2 tablets by mouth 2 (two) times daily as needed for mild constipation.  06/23/14   Love, [name],  PA-C    Allergies    Tizanidine, Aspirin, and Statins  Review of Systems   Review of Systems  Constitutional: Negative for fever.  HENT: Negative for sore throat.   Eyes: Negative for visual disturbance.  Respiratory: Positive for shortness of breath. Negative for cough.   Cardiovascular: Positive for chest pain.  Gastrointestinal: Positive for abdominal pain and nausea. Negative for diarrhea, hematemesis, hematochezia and vomiting.  Genitourinary: Negative for dysuria and hematuria.  Musculoskeletal: Positive for back pain.  Skin: Negative for rash.  Neurological: Negative for headaches.    Physical Exam Updated Vital Signs BP (!) 146/71 (BP Location: Right Arm)  Pulse 96    Temp 98.5 F (36.9 C) (Oral)    Resp 20    Ht 5\' 4"  (1.626 m)    Wt 91.2 kg    SpO2 96%    BMI 34.51 kg/m   Physical Exam Vitals and nursing note reviewed.  Constitutional:      General: She is in acute distress.     Appearance: She is well-developed and well-nourished.  HENT:     Head: Normocephalic and atraumatic.  Eyes:     Conjunctiva/sclera: Conjunctivae normal.  Cardiovascular:     Rate and Rhythm: Normal rate and regular rhythm.     Heart sounds: No murmur heard.   Pulmonary:     Effort: Pulmonary effort is normal. No respiratory distress.     Breath sounds: Normal breath sounds.  Abdominal:     Palpations: Abdomen is soft.     Tenderness: There is abdominal tenderness in the right upper quadrant and right lower quadrant.  Musculoskeletal:        General: No deformity, signs of injury or edema.     Cervical back: Neck supple.  Skin:    General: Skin is warm and dry.  Neurological:     Mental Status: She is alert. Mental status is at baseline.  Psychiatric:        Mood and Affect: Mood and affect normal.     ED Results / Procedures / Treatments   Labs (all labs ordered are listed, but only abnormal results are displayed) Labs Reviewed  LIPASE, BLOOD - Abnormal; Notable for  the following components:      Result Value   Lipase 122 (*)    All other components within normal limits  COMPREHENSIVE METABOLIC PANEL - Abnormal; Notable for the following components:   Sodium 134 (*)    CO2 19 (*)    Glucose, Bld 141 (*)    Albumin 3.2 (*)    All other components within normal limits  CBC - Abnormal; Notable for the following components:   WBC 15.8 (*)    RBC 5.30 (*)    Platelets 522 (*)    All other components within normal limits  BASIC METABOLIC PANEL - Abnormal; Notable for the following components:   CO2 21 (*)    Glucose, Bld 148 (*)    Calcium 8.5 (*)    All other components within normal limits  CBC - Abnormal; Notable for the following components:   WBC 10.6 (*)    All other components within normal limits  RESP PANEL BY RT-PCR (FLU A&B, COVID) ARPGX2  PROTIME-INR  LACTIC ACID, PLASMA  MAGNESIUM  TROPONIN I (HIGH SENSITIVITY)    EKG EKG Interpretation  Date/Time:  Saturday September 16 2020 21:39:48 EST Ventricular Rate:  69 PR Interval:    QRS Duration: 85 QT Interval:  408 QTC Calculation: 438 R Axis:   -22 Text Interpretation: Sinus rhythm Inferior infarct, old No significant change since prior 12/19 Confirmed by Aletta Edouard [phone]) on 09/16/2020 10:02:37 PM Also confirmed by Aletta Edouard [phone]), editor Hattie Perch ([id number])  on 09/17/2020 8:58:12 AM   Radiology CT Abdomen Pelvis W Contrast  Result Date: 09/17/2020 CLINICAL DATA:  Nonlocalized abdominal pain EXAM: CT ABDOMEN AND PELVIS WITH CONTRAST TECHNIQUE: Multidetector CT imaging of the abdomen and pelvis was performed using the standard protocol following bolus administration of intravenous contrast. CONTRAST:  148mL OMNIPAQUE IOHEXOL 300 MG/ML  SOLN COMPARISON:  None. FINDINGS: Lower chest: The visualized heart size within normal limits. No  pericardial fluid/thickening. No hiatal hernia. Bibasilar dependent atelectasis is noted. Hepatobiliary: The liver is normal in  density without focal abnormality.The main portal vein is patent. No evidence of calcified gallstones, gallbladder wall thickening or biliary dilatation. Pancreas: Unremarkable. No pancreatic ductal dilatation or surrounding inflammatory changes. Spleen: Normal in size without focal abnormality. Adrenals/Urinary Tract: Both adrenal glands appear normal. Multiple bilateral parapelvic cysts are noted. No renal or collecting system calculi are seen. The bladder is unremarkable. Stomach/Bowel: There is diffuse wall thickening seen at the distal gastric body and gastroduodenal junction. There is surrounding mild mesenteric fat stranding changes. At the gastroduodenal junction there appears to be a focal outpouching best seen on series 3, image 28 with perforation and a ill-defined fluid collection with air. There is also free air seen extending along the anterior gastric body and around the perihepatic space. The remainder of the small bowel and colon are unremarkable. Vascular/Lymphatic: There are no enlarged mesenteric, retroperitoneal, or pelvic lymph nodes. Scattered aortic atherosclerotic calcifications are seen without aneurysmal dilatation. Reproductive: The uterus and adnexa are unremarkable. Other: As described above small amount of pneumoperitoneum within the upper to mid abdomen. For Musculoskeletal: No acute or significant osseous findings. IMPRESSION: Findings suggestive of a gastritis with perforated gastroduodenal diverticulum/ulcer with adjacent phlegmon. Small amount of pneumoperitoneum within the upper and mid abdomen. Aortic Atherosclerosis (ICD10-I70.0). These results were called by telephone at the time of interpretation on 09/17/2020 at 12:04 am to provider Dr. Dina Rich, who verbally acknowledged these results. Electronically Signed   By: Prudencio Pair M.D.   On: 09/17/2020 00:04   DG Chest Portable 1 View  Result Date: 09/16/2020 CLINICAL DATA:  Chest pain. EXAM: PORTABLE CHEST 1 VIEW COMPARISON:   January 15, 2015. FINDINGS: The heart size and mediastinal contours are within normal limits. No pneumothorax or pleural effusion is noted. Right lung is clear. Minimal left lingular subsegmental atelectasis is noted. The visualized skeletal structures are unremarkable. IMPRESSION: Minimal left lingular subsegmental atelectasis. Electronically Signed   By: Marijo Conception M.D.   On: 09/16/2020 22:18    Procedures Procedures   Medications Ordered in ED Medications  sodium chloride 0.9 % bolus 500 mL (has no administration in time range)  ondansetron (ZOFRAN) injection 4 mg (has no administration in time range)  HYDROmorphone (DILAUDID) injection 1 mg (has no administration in time range)  fentaNYL (SUBLIMAZE) injection 50 mcg (50 mcg Intravenous Given 09/16/20 2156)    ED Course  I have reviewed the triage vital signs and the nursing notes.  Pertinent labs & imaging results that were available during my care of the patient were reviewed by me and considered in my medical decision making (see chart for details).    MDM Rules/Calculators/A&P                         This patient complains of right-sided abdominal pain; this involves an extensive number of treatment Options and is a complaint that carries with it a high risk of complications and Morbidity. The differential includes cholecystitis, cholelithiasis, colitis, peptic ulcer disease, ACS  I ordered, reviewed and interpreted labs, which included CBC with elevated white count normal hemoglobin, chemistries with low bicarb normal LFTs, lipase elevated nonspecific, normal INR, troponin normal I ordered medication IV fluids IV pain medication I ordered imaging studies which included chest x-ray and CT abdomen and pelvis and I independently    visualized and interpreted imaging which showed no acute infiltrates, CT pending at time  of signout Previous records obtained and reviewed in epic, no recent admissions  After the interventions stated  above, I reevaluated the patient and found patient still to be in significant pain.  Repeat pain medication.  Signed out to oncoming provider Dr Christy Gentles to follow-up on CT abdomen pelvis.  Will likely need admission to the hospital   Final Clinical Impression(s) / ED Diagnoses Final diagnoses:  Perforated ulcer ([place])    Rx / DC Orders ED Discharge Orders    None       Hayden Rasmussen, MD 09/17/20 1006

## 2020-09-17 ENCOUNTER — Encounter (HOSPITAL_COMMUNITY): Admission: EM | Disposition: A | Discharge status: Skilled Nursing Facility | Payer: Self-pay | Source: Home / Self Care

## 2020-09-17 ENCOUNTER — Emergency Department (HOSPITAL_COMMUNITY): Payer: Medicare Other | Admitting: Anesthesiology

## 2020-09-17 DIAGNOSIS — I69354 Hemiplegia and hemiparesis following cerebral infarction affecting left non-dominant side: Secondary | ICD-10-CM | POA: Diagnosis not present

## 2020-09-17 DIAGNOSIS — E785 Hyperlipidemia, unspecified: Secondary | ICD-10-CM | POA: Diagnosis present

## 2020-09-17 DIAGNOSIS — Z886 Allergy status to analgesic agent status: Secondary | ICD-10-CM | POA: Diagnosis not present

## 2020-09-17 DIAGNOSIS — Z8249 Family history of ischemic heart disease and other diseases of the circulatory system: Secondary | ICD-10-CM | POA: Diagnosis not present

## 2020-09-17 DIAGNOSIS — K255 Chronic or unspecified gastric ulcer with perforation: Secondary | ICD-10-CM | POA: Diagnosis present

## 2020-09-17 DIAGNOSIS — Z87891 Personal history of nicotine dependence: Secondary | ICD-10-CM | POA: Diagnosis not present

## 2020-09-17 DIAGNOSIS — F419 Anxiety disorder, unspecified: Secondary | ICD-10-CM | POA: Diagnosis present

## 2020-09-17 DIAGNOSIS — K219 Gastro-esophageal reflux disease without esophagitis: Secondary | ICD-10-CM | POA: Diagnosis present

## 2020-09-17 DIAGNOSIS — K275 Chronic or unspecified peptic ulcer, site unspecified, with perforation: Secondary | ICD-10-CM | POA: Diagnosis present

## 2020-09-17 DIAGNOSIS — Z6834 Body mass index (BMI) 34.0-34.9, adult: Secondary | ICD-10-CM | POA: Diagnosis not present

## 2020-09-17 DIAGNOSIS — M1990 Unspecified osteoarthritis, unspecified site: Secondary | ICD-10-CM | POA: Diagnosis present

## 2020-09-17 DIAGNOSIS — I739 Peripheral vascular disease, unspecified: Secondary | ICD-10-CM | POA: Diagnosis present

## 2020-09-17 DIAGNOSIS — K5909 Other constipation: Secondary | ICD-10-CM | POA: Diagnosis present

## 2020-09-17 DIAGNOSIS — F32A Depression, unspecified: Secondary | ICD-10-CM | POA: Diagnosis present

## 2020-09-17 DIAGNOSIS — R198 Other specified symptoms and signs involving the digestive system and abdomen: Secondary | ICD-10-CM | POA: Diagnosis present

## 2020-09-17 DIAGNOSIS — L89322 Pressure ulcer of left buttock, stage 2: Secondary | ICD-10-CM | POA: Diagnosis present

## 2020-09-17 DIAGNOSIS — Z79899 Other long term (current) drug therapy: Secondary | ICD-10-CM | POA: Diagnosis not present

## 2020-09-17 DIAGNOSIS — Z7982 Long term (current) use of aspirin: Secondary | ICD-10-CM | POA: Diagnosis not present

## 2020-09-17 DIAGNOSIS — K650 Generalized (acute) peritonitis: Secondary | ICD-10-CM | POA: Diagnosis present

## 2020-09-17 DIAGNOSIS — E669 Obesity, unspecified: Secondary | ICD-10-CM | POA: Diagnosis present

## 2020-09-17 DIAGNOSIS — N179 Acute kidney failure, unspecified: Secondary | ICD-10-CM | POA: Diagnosis not present

## 2020-09-17 DIAGNOSIS — E876 Hypokalemia: Secondary | ICD-10-CM | POA: Diagnosis not present

## 2020-09-17 DIAGNOSIS — I10 Essential (primary) hypertension: Secondary | ICD-10-CM | POA: Diagnosis present

## 2020-09-17 DIAGNOSIS — I69312 Visuospatial deficit and spatial neglect following cerebral infarction: Secondary | ICD-10-CM | POA: Diagnosis not present

## 2020-09-17 DIAGNOSIS — M7989 Other specified soft tissue disorders: Secondary | ICD-10-CM | POA: Diagnosis not present

## 2020-09-17 DIAGNOSIS — Z20822 Contact with and (suspected) exposure to covid-19: Secondary | ICD-10-CM | POA: Diagnosis present

## 2020-09-17 DIAGNOSIS — E162 Hypoglycemia, unspecified: Secondary | ICD-10-CM | POA: Diagnosis not present

## 2020-09-17 DIAGNOSIS — Z9889 Other specified postprocedural states: Secondary | ICD-10-CM

## 2020-09-17 HISTORY — PX: LAPAROTOMY: SHX154

## 2020-09-17 LAB — RESP PANEL BY RT-PCR (FLU A&B, COVID) ARPGX2
Influenza A by PCR: NEGATIVE
Influenza B by PCR: NEGATIVE
SARS Coronavirus 2 by RT PCR: NEGATIVE

## 2020-09-17 LAB — CBC
HCT: 39.2 % (ref 36.0–46.0)
Hemoglobin: 12.9 g/dL (ref 12.0–15.0)
MCH: 27.6 pg (ref 26.0–34.0)
MCHC: 32.9 g/dL (ref 30.0–36.0)
MCV: 83.9 fL (ref 80.0–100.0)
Platelets: 372 10*3/uL (ref 150–400)
RBC: 4.67 MIL/uL (ref 3.87–5.11)
RDW: 14.1 % (ref 11.5–15.5)
WBC: 10.6 10*3/uL — ABNORMAL HIGH (ref 4.0–10.5)
nRBC: 0 % (ref 0.0–0.2)

## 2020-09-17 LAB — BASIC METABOLIC PANEL
Anion gap: 12 (ref 5–15)
BUN: 14 mg/dL (ref 8–23)
CO2: 21 mmol/L — ABNORMAL LOW (ref 22–32)
Calcium: 8.5 mg/dL — ABNORMAL LOW (ref 8.9–10.3)
Chloride: 105 mmol/L (ref 98–111)
Creatinine, Ser: 0.8 mg/dL (ref 0.44–1.00)
GFR, Estimated: >60 mL/min (ref >60)
Glucose, Bld: 148 mg/dL — ABNORMAL HIGH (ref 70–99)
Potassium: 4.3 mmol/L (ref 3.5–5.1)
Sodium: 138 mmol/L (ref 135–145)

## 2020-09-17 LAB — MAGNESIUM: Magnesium: 1.8 mg/dL (ref 1.7–2.4)

## 2020-09-17 SURGERY — LAPAROTOMY, EXPLORATORY
Anesthesia: General

## 2020-09-17 MED ORDER — PROPOFOL 10 MG/ML IV BOLUS
INTRAVENOUS | Status: AC
  Filled 2020-09-17: qty 20

## 2020-09-17 MED ORDER — SIMETHICONE 80 MG PO CHEW: 40.0000 mg | CHEWABLE_TABLET | Freq: Four times a day (QID) | ORAL | Status: DC | PRN

## 2020-09-17 MED ORDER — MELATONIN 3 MG PO TABS
3.0000 mg | ORAL_TABLET | Freq: Every day | ORAL | Status: DC
  Administered 2020-09-17 – 2020-09-18 (×2): 3 mg
  Filled 2020-09-17 (×2): qty 1

## 2020-09-17 MED ORDER — ROCURONIUM 10MG/ML (10ML) SYRINGE FOR MEDFUSION PUMP - OPTIME
INTRAVENOUS | Status: DC | PRN
  Administered 2020-09-17: 30 mg via INTRAVENOUS
  Administered 2020-09-17: 20 mg via INTRAVENOUS

## 2020-09-17 MED ORDER — FENTANYL CITRATE (PF) 250 MCG/5ML IJ SOLN
INTRAMUSCULAR | Status: DC | PRN
  Administered 2020-09-17: 150 ug via INTRAVENOUS
  Administered 2020-09-17 (×2): 50 ug via INTRAVENOUS

## 2020-09-17 MED ORDER — SUCCINYLCHOLINE CHLORIDE 200 MG/10ML IV SOSY
PREFILLED_SYRINGE | INTRAVENOUS | Status: AC
  Filled 2020-09-17: qty 10

## 2020-09-17 MED ORDER — LIDOCAINE HCL (CARDIAC) PF 100 MG/5ML IV SOSY
PREFILLED_SYRINGE | INTRAVENOUS | Status: DC | PRN
  Administered 2020-09-17: 60 mg via INTRAVENOUS

## 2020-09-17 MED ORDER — SODIUM CHLORIDE 0.9 % IV SOLN
8.0000 mg/h | INTRAVENOUS | Status: DC
  Administered 2020-09-17 (×2): 8 mg/h via INTRAVENOUS
  Filled 2020-09-17 (×4): qty 80

## 2020-09-17 MED ORDER — LACTATED RINGERS IV BOLUS: 500.0000 mL | Freq: Once | INTRAVENOUS | Status: DC

## 2020-09-17 MED ORDER — ONDANSETRON HCL 4 MG/2ML IJ SOLN
4.0000 mg | Freq: Four times a day (QID) | INTRAMUSCULAR | Status: DC | PRN
  Administered 2020-09-20: 4 mg via INTRAVENOUS
  Filled 2020-09-17: qty 2

## 2020-09-17 MED ORDER — FENTANYL CITRATE (PF) 100 MCG/2ML IJ SOLN
25.0000 ug | INTRAMUSCULAR | Status: DC | PRN
  Administered 2020-09-17: 25 ug via INTRAVENOUS

## 2020-09-17 MED ORDER — SODIUM CHLORIDE 0.9 % IV SOLN: INTRAVENOUS | Status: DC

## 2020-09-17 MED ORDER — PHENYLEPHRINE HCL (PRESSORS) 10 MG/ML IV SOLN
INTRAVENOUS | Status: DC | PRN
  Administered 2020-09-17: 160 ug via INTRAVENOUS
  Administered 2020-09-17: 120 ug via INTRAVENOUS

## 2020-09-17 MED ORDER — SODIUM CHLORIDE 0.9% FLUSH
10.0000 mL | INTRAVENOUS | Status: DC | PRN
  Administered 2020-09-26: 10 mL

## 2020-09-17 MED ORDER — ONDANSETRON HCL 4 MG/2ML IJ SOLN
INTRAMUSCULAR | Status: DC | PRN
  Administered 2020-09-17: 4 mg via INTRAVENOUS

## 2020-09-17 MED ORDER — PANTOPRAZOLE SODIUM 40 MG IV SOLR: 40.0000 mg | Freq: Two times a day (BID) | INTRAVENOUS | Status: DC

## 2020-09-17 MED ORDER — ROCURONIUM BROMIDE 10 MG/ML (PF) SYRINGE
PREFILLED_SYRINGE | INTRAVENOUS | Status: AC
  Filled 2020-09-17: qty 10

## 2020-09-17 MED ORDER — SODIUM CHLORIDE 0.9 % IV SOLN
80.0000 mg | Freq: Once | INTRAVENOUS | Status: AC
  Administered 2020-09-17: 80 mg via INTRAVENOUS
  Filled 2020-09-17: qty 80

## 2020-09-17 MED ORDER — PIPERACILLIN-TAZOBACTAM 3.375 G IVPB
3.3750 g | Freq: Three times a day (TID) | INTRAVENOUS | Status: DC
  Administered 2020-09-17 – 2020-09-20 (×10): 3.375 g via INTRAVENOUS
  Filled 2020-09-17 (×10): qty 50

## 2020-09-17 MED ORDER — LACTATED RINGERS IV SOLN: INTRAVENOUS | Status: DC | PRN

## 2020-09-17 MED ORDER — LIDOCAINE 2% (20 MG/ML) 5 ML SYRINGE
INTRAMUSCULAR | Status: AC
  Filled 2020-09-17: qty 5

## 2020-09-17 MED ORDER — PHENYLEPHRINE 40 MCG/ML (10ML) SYRINGE FOR IV PUSH (FOR BLOOD PRESSURE SUPPORT)
PREFILLED_SYRINGE | INTRAVENOUS | Status: AC
  Filled 2020-09-17: qty 10

## 2020-09-17 MED ORDER — METOPROLOL TARTRATE 5 MG/5ML IV SOLN
5.0000 mg | Freq: Four times a day (QID) | INTRAVENOUS | Status: DC | PRN
  Administered 2020-09-20: 5 mg via INTRAVENOUS
  Filled 2020-09-17: qty 5

## 2020-09-17 MED ORDER — SUGAMMADEX SODIUM 200 MG/2ML IV SOLN
INTRAVENOUS | Status: DC | PRN
  Administered 2020-09-17: 200 mg via INTRAVENOUS

## 2020-09-17 MED ORDER — CHLORHEXIDINE GLUCONATE CLOTH 2 % EX PADS
6.0000 | MEDICATED_PAD | Freq: Every day | CUTANEOUS | Status: DC
  Administered 2020-09-17 – 2020-09-26 (×10): 6 via TOPICAL

## 2020-09-17 MED ORDER — SUCCINYLCHOLINE 20MG/ML (10ML) SYRINGE FOR MEDFUSION PUMP - OPTIME
INTRAMUSCULAR | Status: DC | PRN
  Administered 2020-09-17: 90 mg via INTRAVENOUS

## 2020-09-17 MED ORDER — ONDANSETRON HCL 4 MG/2ML IJ SOLN: 4.0000 mg | Freq: Once | INTRAMUSCULAR | Status: DC | PRN

## 2020-09-17 MED ORDER — DIPHENHYDRAMINE HCL 50 MG/ML IJ SOLN: 12.5000 mg | Freq: Four times a day (QID) | INTRAMUSCULAR | Status: DC | PRN

## 2020-09-17 MED ORDER — ACETAMINOPHEN 10 MG/ML IV SOLN
INTRAVENOUS | Status: AC
  Administered 2020-09-17: 1000 mg via INTRAVENOUS
  Filled 2020-09-17: qty 100

## 2020-09-17 MED ORDER — ONDANSETRON HCL 4 MG/2ML IJ SOLN
INTRAMUSCULAR | Status: AC
  Filled 2020-09-17: qty 2

## 2020-09-17 MED ORDER — DEXAMETHASONE SODIUM PHOSPHATE 10 MG/ML IJ SOLN
INTRAMUSCULAR | Status: DC | PRN
  Administered 2020-09-17: 10 mg via INTRAVENOUS

## 2020-09-17 MED ORDER — ALBUMIN HUMAN 5 % IV SOLN: INTRAVENOUS | Status: DC | PRN

## 2020-09-17 MED ORDER — ACETAMINOPHEN 10 MG/ML IV SOLN
1000.0000 mg | Freq: Four times a day (QID) | INTRAVENOUS | Status: AC
  Administered 2020-09-17 – 2020-09-18 (×3): 1000 mg via INTRAVENOUS
  Filled 2020-09-17 (×4): qty 100

## 2020-09-17 MED ORDER — LIDOCAINE 5 % EX PTCH
1.0000 | MEDICATED_PATCH | CUTANEOUS | Status: DC
  Administered 2020-09-17: 1 via TRANSDERMAL
  Filled 2020-09-17: qty 1

## 2020-09-17 MED ORDER — ONDANSETRON 4 MG PO TBDP
4.0000 mg | ORAL_TABLET | Freq: Four times a day (QID) | ORAL | Status: DC | PRN
  Administered 2020-09-23: 4 mg via ORAL
  Filled 2020-09-17: qty 1

## 2020-09-17 MED ORDER — DIPHENHYDRAMINE HCL 12.5 MG/5ML PO ELIX: 12.5000 mg | ORAL_SOLUTION | Freq: Four times a day (QID) | ORAL | Status: DC | PRN

## 2020-09-17 MED ORDER — FENTANYL CITRATE (PF) 100 MCG/2ML IJ SOLN
INTRAMUSCULAR | Status: AC
  Filled 2020-09-17: qty 2

## 2020-09-17 MED ORDER — SODIUM CHLORIDE 0.9% FLUSH
10.0000 mL | Freq: Two times a day (BID) | INTRAVENOUS | Status: DC
  Administered 2020-09-18 – 2020-09-19 (×3): 10 mL
  Administered 2020-09-20: 20 mL
  Administered 2020-09-22 – 2020-09-25 (×7): 10 mL

## 2020-09-17 MED ORDER — FENTANYL CITRATE (PF) 250 MCG/5ML IJ SOLN
INTRAMUSCULAR | Status: AC
  Filled 2020-09-17: qty 5

## 2020-09-17 MED ORDER — PIPERACILLIN-TAZOBACTAM 3.375 G IVPB 30 MIN
3.3750 g | Freq: Once | INTRAVENOUS | Status: AC
  Administered 2020-09-17: 3.375 g via INTRAVENOUS
  Filled 2020-09-17: qty 50

## 2020-09-17 MED ORDER — 0.9 % SODIUM CHLORIDE (POUR BTL) OPTIME
TOPICAL | Status: DC | PRN
  Administered 2020-09-17: 4000 mL

## 2020-09-17 MED ORDER — HYDRALAZINE HCL 20 MG/ML IJ SOLN: 10.0000 mg | INTRAMUSCULAR | Status: DC | PRN

## 2020-09-17 MED ORDER — DEXAMETHASONE SODIUM PHOSPHATE 10 MG/ML IJ SOLN
INTRAMUSCULAR | Status: AC
  Filled 2020-09-17: qty 1

## 2020-09-17 MED ORDER — ACETAMINOPHEN 10 MG/ML IV SOLN: 1000.0000 mg | Freq: Once | INTRAVENOUS | Status: DC | PRN

## 2020-09-17 MED ORDER — EPHEDRINE 5 MG/ML INJ
INTRAVENOUS | Status: AC
  Filled 2020-09-17: qty 10

## 2020-09-17 MED ORDER — PHENYLEPHRINE HCL-NACL 10-0.9 MG/250ML-% IV SOLN
INTRAVENOUS | Status: DC | PRN
  Administered 2020-09-17: 100 ug/min via INTRAVENOUS

## 2020-09-17 MED ORDER — HYDROMORPHONE HCL 1 MG/ML IJ SOLN
0.5000 mg | INTRAMUSCULAR | Status: DC | PRN
  Administered 2020-09-17 – 2020-09-18 (×10): 1 mg via INTRAVENOUS
  Filled 2020-09-17 (×10): qty 1

## 2020-09-17 MED ORDER — ENOXAPARIN SODIUM 40 MG/0.4ML ~~LOC~~ SOLN
40.0000 mg | SUBCUTANEOUS | Status: DC
  Administered 2020-09-17 – 2020-09-28 (×12): 40 mg via SUBCUTANEOUS
  Filled 2020-09-17 (×12): qty 0.4

## 2020-09-17 MED ORDER — PROPOFOL 10 MG/ML IV BOLUS
INTRAVENOUS | Status: DC | PRN
  Administered 2020-09-17: 150 mg via INTRAVENOUS

## 2020-09-17 MED ORDER — BISACODYL 10 MG RE SUPP: 10.0000 mg | Freq: Every day | RECTAL | Status: DC | PRN

## 2020-09-17 SURGICAL SUPPLY — 40 items
CANISTER SUCT 3000ML PPV (MISCELLANEOUS) ×4 IMPLANT
CATH FOLEY 2WAY SLVR  5CC 14FR (CATHETERS) ×1
CATH FOLEY 2WAY SLVR 5CC 14FR (CATHETERS) ×1 IMPLANT
CHLORAPREP W/TINT 26 (MISCELLANEOUS) ×2 IMPLANT
COVER SURGICAL LIGHT HANDLE (MISCELLANEOUS) ×2 IMPLANT
COVER WAND RF STERILE (DRAPES) ×2 IMPLANT
DRAIN CHANNEL 19F RND (DRAIN) ×2 IMPLANT
DRAPE LAPAROSCOPIC ABDOMINAL (DRAPES) ×2 IMPLANT
DRAPE WARM FLUID 44X44 (DRAPES) ×2 IMPLANT
DRSG MEPILEX SACRM 8.7X9.8 (GAUZE/BANDAGES/DRESSINGS) ×2 IMPLANT
DRSG OPSITE POSTOP 4X10 (GAUZE/BANDAGES/DRESSINGS) ×2 IMPLANT
DRSG OPSITE POSTOP 4X8 (GAUZE/BANDAGES/DRESSINGS) IMPLANT
ELECT BLADE 6.5 EXT (BLADE) IMPLANT
ELECT CAUTERY BLADE 6.4 (BLADE) ×2 IMPLANT
ELECT REM PT RETURN 9FT ADLT (ELECTROSURGICAL) ×2
ELECTRODE REM PT RTRN 9FT ADLT (ELECTROSURGICAL) ×1 IMPLANT
EVACUATOR SILICONE 100CC (DRAIN) ×2 IMPLANT
GAUZE SPONGE 4X4 12PLY STRL (GAUZE/BANDAGES/DRESSINGS) ×2 IMPLANT
GLOVE BIO SURGEON STRL SZ 6 (GLOVE) ×2 IMPLANT
GLOVE SURG UNDER LTX SZ6.5 (GLOVE) ×2 IMPLANT
GOWN STRL REUS W/ TWL LRG LVL3 (GOWN DISPOSABLE) ×2 IMPLANT
GOWN STRL REUS W/TWL LRG LVL3 (GOWN DISPOSABLE) ×2
HANDLE SUCTION POOLE (INSTRUMENTS) ×1 IMPLANT
KIT BASIN OR (CUSTOM PROCEDURE TRAY) ×2 IMPLANT
KIT TURNOVER KIT B (KITS) ×2 IMPLANT
NS IRRIG 1000ML POUR BTL (IV SOLUTION) ×4 IMPLANT
PACK GENERAL/GYN (CUSTOM PROCEDURE TRAY) ×2 IMPLANT
PAD ARMBOARD 7.5X6 YLW CONV (MISCELLANEOUS) ×2 IMPLANT
PENCIL SMOKE EVACUATOR (MISCELLANEOUS) ×2 IMPLANT
SPONGE LAP 18X18 RF (DISPOSABLE) IMPLANT
STAPLER VISISTAT 35W (STAPLE) ×2 IMPLANT
SUCTION POOLE HANDLE (INSTRUMENTS) ×2
SUT ETHILON 2 0 FS 18 (SUTURE) ×2 IMPLANT
SUT PDS AB 1 TP1 96 (SUTURE) ×4 IMPLANT
SUT SILK 2 0 SH CR/8 (SUTURE) ×2 IMPLANT
SUT SILK 2 0 TIES 10X30 (SUTURE) ×2 IMPLANT
SUT SILK 3 0 SH CR/8 (SUTURE) ×2 IMPLANT
SUT SILK 3 0 TIES 10X30 (SUTURE) ×2 IMPLANT
TOWEL GREEN STERILE (TOWEL DISPOSABLE) ×2 IMPLANT
TRAY FOLEY W/BAG SLVR 14FR (SET/KITS/TRAYS/PACK) ×2 IMPLANT

## 2020-09-17 NOTE — Anesthesia Postprocedure Evaluation (Signed)
Anesthesia Post Note  Patient: Mary Le  Procedure(s) Performed: EXPLORATORY LAPAROTOMY, OMENTUM PEDICLE FLAP REPAIR (N/A )     Patient location during evaluation: PACU Anesthesia Type: General Level of consciousness: awake Pain management: pain level controlled Vital Signs Assessment: post-procedure vital signs reviewed and stable Respiratory status: spontaneous breathing, nonlabored ventilation, respiratory function stable and patient connected to nasal cannula oxygen Cardiovascular status: blood pressure returned to baseline and stable Postop Assessment: no apparent nausea or vomiting Anesthetic complications: no   No complications documented.  Last Vitals:  Vitals:   09/17/20 0454 09/17/20 0526  BP: (!) 129/42 (!) 107/43  Pulse:    Resp:  19  Temp:  36.5 C  SpO2:  98%    Last Pain:  Vitals:   09/17/20 0701  TempSrc:   PainSc: 0-No pain                 Ryan P Ellender

## 2020-09-17 NOTE — Anesthesia Procedure Notes (Signed)
Procedure Name: Intubation Date/Time: 09/17/2020 1:37 AM Performed by: Valetta Fuller, CRNA Pre-anesthesia Checklist: Patient identified, Emergency Drugs available, Suction available and Patient being monitored Patient Re-evaluated:Patient Re-evaluated prior to induction Oxygen Delivery Method: Circle system utilized Preoxygenation: Pre-oxygenation with 100% oxygen Induction Type: IV induction and Rapid sequence Laryngoscope Size: Miller and 2 Grade View: Grade I Tube type: Oral Tube size: 7.0 mm Number of attempts: 1 Airway Equipment and Method: Stylet Placement Confirmation: ETT inserted through vocal cords under direct vision and positive ETCO2 Secured at: 22 cm Tube secured with: Tape Dental Injury: Teeth and Oropharynx as per pre-operative assessment

## 2020-09-17 NOTE — ED Notes (Signed)
Attempted report, OR nurse stated if all that is known is in the chart to send pt up.

## 2020-09-17 NOTE — Transfer of Care (Signed)
Immediate Anesthesia Transfer of Care Note  Patient: Mary Le  Procedure(s) Performed: EXPLORATORY LAPAROTOMY, OMENTUM PEDICLE FLAP REPAIR (N/A )  Patient Location: PACU  Anesthesia Type:General  Level of Consciousness: awake, alert  and oriented  Airway & Oxygen Therapy: Patient Spontanous Breathing  Post-op Assessment: Report given to RN and Post -op Vital signs reviewed and stable  Post vital signs: Reviewed and stable  Last Vitals:  Vitals Value Taken Time  BP 92/52 09/17/20 0323  Temp    Pulse 89 09/17/20 0325  Resp 13 09/17/20 0326  SpO2 100 % 09/17/20 0325  Vitals shown include unvalidated device data.  Last Pain:  Vitals:   09/17/20 0113  PainSc: 8          Complications: No complications documented.

## 2020-09-17 NOTE — H&P (Signed)
Surgical Evaluation Requesting provider: Dr. Christy Gentles  Chief Complaint: abdominal pain  HPI: 75 year old woman with multiple medical problems as listed below who presents with acute worsening of abdominal pain.  She has been having abdominal pain and vague nausea for 3 or 4 days, but at 7 PM today had acute onset of right-sided abdominal pain radiating from the right lower quadrant to the right upper quadrant into her shoulder and back.  This is severe.  Aggravated by taking a deep breath.  Does note chronic constipation.   Allergies  Allergen Reactions  . Tizanidine Itching  . Aspirin Nausea And Vomiting    Tolerated baby aspirin, but with more than once per day of full strength for aches and pains - had stomach upset.  No history of PUD/gastric bleeding known.   . Statins Other (See Comments)    Myalgias and chest pain (has tried Lipitor and Pravachol)    Past Medical History:  Diagnosis Date  . Allergy   . Anxiety   . Arthritis    back  . Back pain    arthritis  . Clotting disorder ([place])   . CVA (cerebral infarction)   . Depression   . Fall 11/2014   fell in bathroom, uses a cane  . Hypercholesteremia   . Hypertension   . Joint pain    back, history of   . Stroke ([place])   . Weakness     Past Surgical History:  Procedure Laterality Date  . APPENDECTOMY    . HEMORROIDECTOMY    . IR RADIOLOGY PERIPHERAL GUIDED IV START  06/18/2017  . IR RADIOLOGY PERIPHERAL GUIDED IV START  07/09/2018  . IR US GUIDE VASC ACCESS RIGHT  06/18/2017  . IR US GUIDE VASC ACCESS RIGHT  07/09/2018  . LOOP RECORDER IMPLANT  06/07/2014   MDT LINQ implanted by Dr [name] for cryptogenic stroke  . LOOP RECORDER IMPLANT N/A 06/07/2014   Procedure: LOOP RECORDER IMPLANT;  Surgeon: [name], MD;  Location: [facility] CATH [facility];  Service: Cardiovascular;  Laterality: N/A;  . RADIOLOGY WITH ANESTHESIA N/A 06/03/2014   Procedure: RADIOLOGY WITH ANESTHESIA;  Surgeon: [name], MD;  Location: [facility];  Service: Radiology;  Laterality: N/A;  . TEE WITHOUT CARDIOVERSION N/A 06/07/2014   Procedure: TRANSESOPHAGEAL ECHOCARDIOGRAM (TEE);  Surgeon: [name], MD;  Location: [facility] ENDOSCOPY;  Service: Cardiovascular;  Laterality: N/A;  . TONSILLECTOMY    . TUBAL LIGATION      Family History  Problem Relation Age of Onset  . Coronary artery disease Mother 58       MI  . Kidney disease Father   . Breast cancer Daughter   . Stomach cancer Other   . Colon cancer Neg Hx   . Liver cancer Neg Hx   . Colon polyps Neg Hx   . Esophageal cancer Neg Hx   . Rectal cancer Neg Hx     Social History   Socioeconomic History  . Marital status: Married    Spouse name: Not on file  . Number of children: 1  . Years of education: Not on file  . Highest education level: Not on file  Occupational History  . Occupation: RETIRED  Tobacco Use  . Smoking status: Former Smoker    Years: 5.00  . Smokeless tobacco: Never Used  . Tobacco comment: quit smoking 40 years ago  Vaping Use  . Vaping Use: Never used  Substance and Sexual Activity  . Alcohol use: Yes  Alcohol/week: 1.0 standard drink    Types: 1 Glasses of wine per week    Comment: occasionally wine  . Drug use: No  . Sexual activity: Not on file  Other Topics Concern  . Not on file  Social History Narrative   Lives in [place]    Married. Education: [employer].    Social Determinants of Health   Financial Resource Strain: Not on file  Food Insecurity: Not on file  Transportation Needs: Not on file  Physical Activity: Not on file  Stress: Not on file  Social Connections: Not on file    No current facility-administered medications on file prior to encounter.   Current Outpatient Medications on File Prior to Encounter  Medication Sig Dispense Refill  . acetaminophen (TYLENOL) 325 MG tablet Take 650 mg by mouth every 6 (six) hours as needed (pain).     Marland Kitchen aspirin 81 MG tablet Take 81 mg by mouth daily.    . Evolocumab  (REPATHA SURECLICK) 759 MG/ML SOAJ Inject 140 mg into the skin every 14 (fourteen) days. 2 pen 6  . famotidine (PEPCID) 10 MG tablet Take 1 tablet (10 mg total) by mouth 2 (two) times daily. (Patient taking differently: Take 10 mg by mouth daily as needed for heartburn or indigestion. ) 60 tablet 1  . loratadine (ALAVERT) 10 MG tablet Take 10 mg by mouth daily as needed for allergies.    Marland Kitchen losartan (COZAAR) 100 MG tablet Take 100 mg by mouth daily.    . metoprolol succinate (TOPROL-XL) 25 MG 24 hr tablet Take 12.5 mg by mouth daily.    . Multiple Vitamin (MULTIVITAMIN WITH MINERALS) TABS tablet Take 1 tablet by mouth daily. Reported on 11/22/2015    . Polyethyl Glycol-Propyl Glycol (SYSTANE OP) Place 1 drop into both eyes daily as needed (dry eyes).     . polyethylene glycol powder (GLYCOLAX/MIRALAX) powder Take 17 g by mouth See admin instructions. AS NEEDED EVERY 3-4 DAYS FOR CONSTIPATION    . senna-docusate (SENOKOT-S) 8.6-50 MG per tablet Take 3 tablets by mouth 2 (two) times daily. (Patient taking differently: Take 2 tablets by mouth 2 (two) times daily as needed for mild constipation. ) 100 tablet 1    Review of Systems: a complete, 10pt review of systems was completed with pertinent positives and negatives as documented in the HPI  Physical Exam: Vitals:   09/16/20 2140 09/16/20 2145  BP:    Pulse: (!) 105 73  Resp: 14 14  SpO2: 92% 90%   Gen: Alert, cooperative, chronically ill-appearing Eyes: lids and conjunctivae normal, no icterus. Pupils equally round and reactive to light.  Neck: supple without mass or thyromegaly Chest: respiratory effort is normal. No crepitus or tenderness on palpation of the chest. Breath sounds equal.  Cardiovascular: RRR with palpable distal pulses, no pedal edema Gastrointestinal: Obese, mildly distended, diffusely tender with involuntary guarding worst in the epigastrium and right hemiabdomen. No mass, hepatomegaly or splenomegaly. No hernia. Lymphatic:  no lymphadenopathy in the neck or groin Muscoloskeletal: no clubbing or cyanosis of the fingers.  upper and lower extremities normal without pain, crepitation or contracture. Neuro: cranial nerves grossly intact.  Sensation intact to light touch diffusely. Psych: appropriate mood and affect, normal insight/judgment intact  Skin: warm and dry   CBC Latest Ref Rng & Units 09/16/2020 07/13/2018 08/19/2017  WBC 4.0 - 10.5 K/uL 15.8(H) 13.0(H) 10.0  Hemoglobin 12.0 - 15.0 g/dL 14.0 15.6(H) 15.3  Hematocrit 36.0 - 46.0 % 44.8 50.1(H) 46.5  Platelets  150 - 400 K/uL 522(H) 313 395(H)    CMP Latest Ref Rng & Units 09/16/2020 02/24/2020 09/02/2018  Glucose 70 - 99 mg/dL 141(H) - -  BUN 8 - 23 mg/dL 17 - -  Creatinine 0.44 - 1.00 mg/dL 0.79 1.00 -  Sodium 135 - 145 mmol/L 134(L) - -  Potassium 3.5 - 5.1 mmol/L 4.4 - -  Chloride 98 - 111 mmol/L 103 - -  CO2 22 - 32 mmol/L 19(L) - -  Calcium 8.9 - 10.3 mg/dL 9.1 - -  Total Protein 6.5 - 8.1 g/dL 7.1 - 7.4  Total Bilirubin 0.3 - 1.2 mg/dL 0.6 - 0.2  Alkaline Phos 38 - 126 U/L 52 - 82  AST 15 - 41 U/L 19 - 19  ALT 0 - 44 U/L 15 - 22    Lab Results  Component Value Date   INR 1.0 09/16/2020   INR 1.04 06/03/2014    Imaging: CT Abdomen Pelvis W Contrast  Result Date: 09/17/2020 CLINICAL DATA:  Nonlocalized abdominal pain EXAM: CT ABDOMEN AND PELVIS WITH CONTRAST TECHNIQUE: Multidetector CT imaging of the abdomen and pelvis was performed using the standard protocol following bolus administration of intravenous contrast. CONTRAST:  137mL OMNIPAQUE IOHEXOL 300 MG/ML  SOLN COMPARISON:  None. FINDINGS: Lower chest: The visualized heart size within normal limits. No pericardial fluid/thickening. No hiatal hernia. Bibasilar dependent atelectasis is noted. Hepatobiliary: The liver is normal in density without focal abnormality.The main portal vein is patent. No evidence of calcified gallstones, gallbladder wall thickening or biliary dilatation. Pancreas:  Unremarkable. No pancreatic ductal dilatation or surrounding inflammatory changes. Spleen: Normal in size without focal abnormality. Adrenals/Urinary Tract: Both adrenal glands appear normal. Multiple bilateral parapelvic cysts are noted. No renal or collecting system calculi are seen. The bladder is unremarkable. Stomach/Bowel: There is diffuse wall thickening seen at the distal gastric body and gastroduodenal junction. There is surrounding mild mesenteric fat stranding changes. At the gastroduodenal junction there appears to be a focal outpouching best seen on series 3, image 28 with perforation and a ill-defined fluid collection with air. There is also free air seen extending along the anterior gastric body and around the perihepatic space. The remainder of the small bowel and colon are unremarkable. Vascular/Lymphatic: There are no enlarged mesenteric, retroperitoneal, or pelvic lymph nodes. Scattered aortic atherosclerotic calcifications are seen without aneurysmal dilatation. Reproductive: The uterus and adnexa are unremarkable. Other: As described above small amount of pneumoperitoneum within the upper to mid abdomen. For Musculoskeletal: No acute or significant osseous findings. IMPRESSION: Findings suggestive of a gastritis with perforated gastroduodenal diverticulum/ulcer with adjacent phlegmon. Small amount of pneumoperitoneum within the upper and mid abdomen. Aortic Atherosclerosis (ICD10-I70.0). These results were called by telephone at the time of interpretation on 09/17/2020 at 12:04 am to provider Dr. Dina Rich, who verbally acknowledged these results. Electronically Signed   By: Prudencio Pair M.D.   On: 09/17/2020 00:04   DG Chest Portable 1 View  Result Date: 09/16/2020 CLINICAL DATA:  Chest pain. EXAM: PORTABLE CHEST 1 VIEW COMPARISON:  January 15, 2015. FINDINGS: The heart size and mediastinal contours are within normal limits. No pneumothorax or pleural effusion is noted. Right lung is clear.  Minimal left lingular subsegmental atelectasis is noted. The visualized skeletal structures are unremarkable. IMPRESSION: Minimal left lingular subsegmental atelectasis. Electronically Signed   By: Marijo Conception M.D.   On: 09/16/2020 22:18     A/P: 75 year old woman with perforated viscus.  I recommend proceeding with emergent exploratory laparotomy and  likely Reynolds patch and drain placement.  I discussed the procedure with the patient and I discussed the risks of bleeding, infection, pain, scarring, leak, need for feeding tube placement, possible need for additional surgery, as well as cardiovascular, thromboembolic and pulmonary risks.  Questions were welcomed and answered.  She agrees to proceed.  I have attempted to call her daughter-who patient states is her designated medical decision-maker if she is unable to do so- multiple times but had not been able to reach her.    Patient Active Problem List   Diagnosis Date Noted  . Peripheral arterial disease ([place]) 09/02/2018  . HTN (hypertension) 07/26/2015  . Obesity (BMI 30.0-34.9) 07/26/2015  . Syncope 12/06/2014  . Spastic hemiplegia affecting nondominant side ([place]) 07/25/2014  . 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 Left hemiparesis ([place]) 06/10/2014  . Left-sided neglect 06/10/2014  . Thrombotic stroke involving right middle cerebral artery ([place]) 06/08/2014  . Acute respiratory failure with hypoxia ([place]) 06/05/2014  . Cerebral infarction due to occlusion of right middle cerebral artery ([place]) 06/03/2014  . Cerebral infarct ([place]) 06/03/2014  . Altered mental status 06/03/2014  . GERD (gastroesophageal reflux disease) 04/12/2014  . Dyslipidemia- statin intol 02/07/2014  . Chest pain- Low risk Myoview July 2015 02/06/2014       Romana Juniper, MD [facility], [place]  See AMION to contact appropriate on-call provider

## 2020-09-17 NOTE — Op Note (Signed)
Operative Note  Mary Le  [id number]  [id number]  09/17/2020   Surgeon: Vikki Ports A ConnorMD  Procedure performed: Exploratory laparotomy, omental pedicle flap Phillip Heal patch) repair of perforated ulcer  Preop diagnosis: Perforated viscus Post-op diagnosis/intraop findings: Same, perforated pyloric channel ulcer  Procedure status: Emergent Infection present at the time of surgery: Yes, purulent peritonitis and bilious succus  Specimens: no Retained items: 19 [place] round Blake drain EBL: 51OA Complications: none  Description of procedure: After obtaining informed consent the patient was taken to the operating room and placed supine on operating room table wheregeneral endotracheal anesthesia was initiated, SCDs applied, and a formal timeout was performed.  She is on standing antibiotics.  An NG tube was placed which was ultimately confirmed to be in the appropriate position by palpation during the case.  A Foley catheter was placed under sterile conditions.  The abdomen was then prepped and draped in usual sterile fashion.  A midline laparotomy was made and a rush of air was noted on entry into the peritoneal cavity.  Bilious succus and pus was present in the right upper quadrant and upper abdomen.  This was aspirated and the stomach and proximal duodenum were carefully inspected.  There was thickening and evidence of a perforation on the superior anterior aspect of the pyloric channel, much of which had already adhesed itself to the undersurface of the liver.  We turned to the omentum and dissected a well vascularized omental pedicle which easily reached the perforation without tension.  3 2-0 silks were placed across the perforation and the omental pedicle flap was brought up to lie over the perforation underneath the liver surface.  This was secured with the previously placed 2 oh silks.  This was additionally pexed to the anterior stomach and adjacent falciform.  She had a very  bulbous falciform ligament which was taken down off the abdominal wall as well and placed over the area of the [facility].  The abdomen was then irrigated with warm sterile saline and the effluent was clear.  A 19 [place] round Blake drain was placed through the right midabdomen to abut the [facility] and secured to the skin with a 2-0 nylon.  This was ultimately placed to bulb suction.  The fascia was closed with running looped #1 PDS starting at either end and tying centrally.  The soft tissues were irrigated and hemostasis confirmed.  The skin was reapproximated with staples and a honeycomb dressing was applied  The patient was then awakened, extubated and taken to PACU in stable condition.   All counts were correct at the completion of the case.

## 2020-09-17 NOTE — ED Provider Notes (Signed)
I assumed care in signout to follow-up on imaging.  CT scan reveals likely perforated ulcer.  Patient is awake/alert but having significant abdominal tenderness.  Her blood pressure is elevated. I have ordered IV antibiotics.  I discussed the case with Dr. Kae Heller with general surgery who will take to the operating room. Also discussed the case with her daughter, [name], and she was informed of the results.  Daughter also asked me to evaluate the wound on her right breast that she has had for some time.  With a nurse present I examined the wound, there is no induration, but some mild discharge.  I asked nursing to clean this wound.  CRITICAL CARE Performed by: Sharyon Cable Total critical care time: 31 minutes Critical care time was exclusive of separately billable procedures and treating other patients. Critical care was necessary to treat or prevent imminent or life-threatening deterioration. Critical care was time spent personally by me on the following activities: development of treatment plan with patient and/or surrogate as well as nursing, discussions with consultants, evaluation of patient's response to treatment, examination of patient, obtaining history from patient or surrogate, ordering and performing treatments and interventions, ordering and review of laboratory studies, ordering and review of radiographic studies, pulse oximetry and re-evaluation of patient's condition.    Ripley Fraise, MD 09/17/20 [phone]

## 2020-09-17 NOTE — Progress Notes (Signed)
Pt began moaning and having sever pain in right lateral ribs and right anterior shoulder. Hydromorphone 1mg  had already been given prior to new pain onset.  General surgeon MD paged and came to the room to assess patient. 12-lead EKG performed, which only showed sinus tach. Mobility of shoulder, repositioning, warm compress, Lidocaine patch, and acetaminophen IV administered.  Hydromorphone 1mg  being given every Q2 hrs PRN as ordered. Additionally, Pt psychologically comforted and encouraged to breath slowly and deeply through episodes of break through pain.

## 2020-09-17 NOTE — Anesthesia Preprocedure Evaluation (Addendum)
Anesthesia Evaluation  Patient identified by MRN, date of birth, ID band Patient awake    Reviewed: Allergy & Precautions, NPO status , Patient's Chart, lab work & pertinent test resultsPreop documentation limited or incomplete due to emergent nature of procedure.  Airway Mallampati: III  TM Distance: >3 FB Neck ROM: Full    Dental  (+) Poor Dentition   Pulmonary former smoker,    Pulmonary exam normal breath sounds clear to auscultation       Cardiovascular hypertension, Pt. on medications and Pt. on home beta blockers + Peripheral Vascular Disease  Normal cardiovascular exam Rhythm:Regular Rate:Normal  ECG: SR, rate 69   Neuro/Psych PSYCHIATRIC DISORDERS Anxiety Depression Left hemiparesis CVA, Residual Symptoms    GI/Hepatic negative GI ROS, Neg liver ROS,   Endo/Other  negative endocrine ROS  Renal/GU negative Renal ROS     Musculoskeletal  (+) Arthritis ,   Abdominal (+) + obese,   Peds  Hematology negative hematology ROS (+)   Anesthesia Other Findings PERFORATED ULCER  Reproductive/Obstetrics                            Anesthesia Physical Anesthesia Plan  ASA: III and emergent  Anesthesia Plan: General   Post-op Pain Management:    Induction: Intravenous and Rapid sequence  PONV Risk Score and Plan: 3 and Ondansetron, Dexamethasone and Treatment may vary due to age or medical condition  Airway Management Planned: Oral ETT  Additional Equipment:   Intra-op Plan:   Post-operative Plan: Extubation in OR  Informed Consent: I have reviewed the patients History and Physical, chart, labs and discussed the procedure including the risks, benefits and alternatives for the proposed anesthesia with the patient or authorized representative who has indicated his/her understanding and acceptance.     Dental advisory given, Only emergency history available and History available from  chart only  Plan Discussed with: CRNA  Anesthesia Plan Comments:         Anesthesia Quick Evaluation

## 2020-09-17 NOTE — Progress Notes (Signed)
Day of Surgery   Subjective/Chief Complaint: Complains of pain in abd and at nose   Objective: Vital signs in last 24 hours: Temp:  [97 F (36.1 C)-97.7 F (36.5 C)] 97.7 F (36.5 C) (02/27 0526) Pulse Rate:  [65-105] 96 (02/27 0453) Resp:  [14-24] 19 (02/27 0526) BP: (87-168)/(29-75) 107/43 (02/27 0526) SpO2:  [90 %-100 %] 98 % (02/27 0526) Weight:  [90.9 kg-91.2 kg] 91.2 kg (02/27 0531) Last BM Date: 09/15/19  Intake/Output from previous day: 02/26 0701 - 02/27 0700 In: 1000 [I.V.:500; IV Piggyback:500] Out: 565 [Urine:550; Drains:15] Intake/Output this shift: No intake/output data recorded.  General appearance: alert and cooperative Resp: clear to auscultation bilaterally Cardio: regular rate and rhythm GI: soft, appropriately tender. drain output nonbilious  Lab Results:  Recent Labs    09/16/20 2154 09/17/20 0557  WBC 15.8* 10.6*  HGB 14.0 12.9  HCT 44.8 39.2  PLT 522* 372   BMET Recent Labs    09/16/20 2154 09/17/20 0557  NA 134* 138  K 4.4 4.3  CL 103 105  CO2 19* 21*  GLUCOSE 141* 148*  BUN 17 14  CREATININE 0.79 0.80  CALCIUM 9.1 8.5*   PT/INR Recent Labs    09/16/20 2154  LABPROT 13.0  INR 1.0   ABG No results for input(s): PHART, HCO3 in the last 72 hours.  Invalid input(s): PCO2, PO2  Studies/Results: CT Abdomen Pelvis W Contrast  Result Date: 09/17/2020 CLINICAL DATA:  Nonlocalized abdominal pain EXAM: CT ABDOMEN AND PELVIS WITH CONTRAST TECHNIQUE: Multidetector CT imaging of the abdomen and pelvis was performed using the standard protocol following bolus administration of intravenous contrast. CONTRAST:  165mL OMNIPAQUE IOHEXOL 300 MG/ML  SOLN COMPARISON:  None. FINDINGS: Lower chest: The visualized heart size within normal limits. No pericardial fluid/thickening. No hiatal hernia. Bibasilar dependent atelectasis is noted. Hepatobiliary: The liver is normal in density without focal abnormality.The main portal vein is patent. No  evidence of calcified gallstones, gallbladder wall thickening or biliary dilatation. Pancreas: Unremarkable. No pancreatic ductal dilatation or surrounding inflammatory changes. Spleen: Normal in size without focal abnormality. Adrenals/Urinary Tract: Both adrenal glands appear normal. Multiple bilateral parapelvic cysts are noted. No renal or collecting system calculi are seen. The bladder is unremarkable. Stomach/Bowel: There is diffuse wall thickening seen at the distal gastric body and gastroduodenal junction. There is surrounding mild mesenteric fat stranding changes. At the gastroduodenal junction there appears to be a focal outpouching best seen on series 3, image 28 with perforation and a ill-defined fluid collection with air. There is also free air seen extending along the anterior gastric body and around the perihepatic space. The remainder of the small bowel and colon are unremarkable. Vascular/Lymphatic: There are no enlarged mesenteric, retroperitoneal, or pelvic lymph nodes. Scattered aortic atherosclerotic calcifications are seen without aneurysmal dilatation. Reproductive: The uterus and adnexa are unremarkable. Other: As described above small amount of pneumoperitoneum within the upper to mid abdomen. For Musculoskeletal: No acute or significant osseous findings. IMPRESSION: Findings suggestive of a gastritis with perforated gastroduodenal diverticulum/ulcer with adjacent phlegmon. Small amount of pneumoperitoneum within the upper and mid abdomen. Aortic Atherosclerosis (ICD10-I70.0). These results were called by telephone at the time of interpretation on 09/17/2020 at 12:04 am to provider Dr. Dina Rich, who verbally acknowledged these results. Electronically Signed   By: Prudencio Pair M.D.   On: 09/17/2020 00:04   DG Chest Portable 1 View  Result Date: 09/16/2020 CLINICAL DATA:  Chest pain. EXAM: PORTABLE CHEST 1 VIEW COMPARISON:  January 15, 2015.  FINDINGS: The heart size and mediastinal contours are  within normal limits. No pneumothorax or pleural effusion is noted. Right lung is clear. Minimal left lingular subsegmental atelectasis is noted. The visualized skeletal structures are unremarkable. IMPRESSION: Minimal left lingular subsegmental atelectasis. Electronically Signed   By: Marijo Conception M.D.   On: 09/16/2020 22:18    Anti-infectives: Anti-infectives (From admission, onward)   Start     Dose/Rate Route Frequency Ordered Stop   09/17/20 0600  piperacillin-tazobactam (ZOSYN) IVPB 3.375 g       Note to Pharmacy: Pharmacy to adjust dose as indicated   3.375 g 12.5 mL/hr over 240 Minutes Intravenous Every 8 hours 09/17/20 0506     09/17/20 0015  piperacillin-tazobactam (ZOSYN) IVPB 3.375 g        3.375 g 100 mL/hr over 30 Minutes Intravenous  Once 09/17/20 0003 09/17/20 0114      Assessment/Plan: s/p Procedure(s): EXPLORATORY LAPAROTOMY, OMENTUM PEDICLE FLAP REPAIR (N/A) will need ng retaped to nose to prevent necrosis. I have spoken to nurses about this  Continue ng and bowel rest Continue zosyn for perforated gastric ulcer OOB to chair Continue drain POD 1  LOS: 0 days    Mary Le 09/17/2020

## 2020-09-18 ENCOUNTER — Encounter (HOSPITAL_COMMUNITY): Payer: Self-pay | Admitting: Surgery

## 2020-09-18 MED ORDER — PANTOPRAZOLE SODIUM 40 MG IV SOLR
40.0000 mg | Freq: Two times a day (BID) | INTRAVENOUS | Status: DC
  Administered 2020-09-18 – 2020-09-23 (×11): 40 mg via INTRAVENOUS
  Filled 2020-09-18 (×12): qty 40

## 2020-09-18 MED ORDER — SODIUM CHLORIDE 0.9 % IV BOLUS: 500.0000 mL | Freq: Once | INTRAVENOUS | Status: DC

## 2020-09-18 MED ORDER — METOPROLOL TARTRATE 5 MG/5ML IV SOLN
2.5000 mg | Freq: Four times a day (QID) | INTRAVENOUS | Status: DC
  Administered 2020-09-18 – 2020-09-19 (×3): 2.5 mg via INTRAVENOUS
  Filled 2020-09-18 (×3): qty 5

## 2020-09-18 MED ORDER — MORPHINE SULFATE (PF) 2 MG/ML IV SOLN
2.0000 mg | INTRAVENOUS | Status: DC | PRN
  Administered 2020-09-18 – 2020-09-22 (×25): 2 mg via INTRAVENOUS
  Filled 2020-09-18 (×26): qty 1

## 2020-09-18 MED ORDER — LIDOCAINE 5 % EX PTCH
1.0000 | MEDICATED_PATCH | CUTANEOUS | Status: DC
  Administered 2020-09-18 – 2020-09-28 (×8): 1 via TRANSDERMAL
  Filled 2020-09-18 (×13): qty 1

## 2020-09-18 MED ORDER — SODIUM CHLORIDE 0.9 % IV BOLUS: 500.0000 mL | Freq: Once | INTRAVENOUS | Status: AC

## 2020-09-18 NOTE — Evaluation (Signed)
Physical Therapy Evaluation Patient Details Name: Mary Le MRN: [id number] DOB: March 28, 1946 Today's Date: 09/18/2020   History of Present Illness  75 year old woman with multiple medical problems as listed below who presents with acute worsening of abdominal pain.  She has been having abdominal pain and vague nausea for 3 or 4 days, now acute onset of right-sided abdominal pain radiating from the right lower quadrant to the right upper quadrant into her shoulder and back. Admitted 09/16/20 for ex lap and repair of preforated pyloric channel ulcer. 09/17/20  Clinical Impression  PTA pt living with daughter in single story home with 1 step to enter and 2 steps to pt bedroom. Pt reports ambulation household distances with a cane and daughter assists with ADLs and iADLs. Pt severely limited by pain today, requiring total A to come to EOB. PT anticipates much better mobility with controlled pain, currently recommending HHPT and 24 hr supervision but will continue to assess in subsequent sessions. PT will continue to follow acutely.     Follow Up Recommendations Home health PT;Supervision/Assistance - 24 hour    Equipment Recommendations  Other (comment) (possible hemiwalker)    Recommendations for Other Services OT consult     Precautions / Restrictions Precautions Precautions: None Precaution Comments: NG tube, JP drain on R side Restrictions Weight Bearing Restrictions: Yes      Mobility  Bed Mobility Overal bed mobility: Needs Assistance Bed Mobility: Rolling;Sidelying to Sit;Sit to Sidelying Rolling: Max assist;Mod assist Sidelying to sit: Total assist;+2 for physical assistance     Sit to sidelying: Max assist General bed mobility comments: requires max A and increased cuing for rolling to sidelying, total A for bringing trunk to upright, max A for bring LE back into bed, modA for rolling onto back    Transfers                 General transfer comment: unable to  attempt due to pain      Balance Overall balance assessment: Needs assistance Sitting-balance support: Feet unsupported;Single extremity supported;No upper extremity supported Sitting balance-Leahy Scale: Fair Sitting balance - Comments: able to tolerate only 5 min on EoB, before requesting to return to supine due to increased pain                                     Pertinent Vitals/Pain Pain Assessment: 0-10 Pain Score: 10-Worst pain ever Pain Location: surgical incision R chest below breast Pain Descriptors / Indicators: Operative site guarding;Grimacing;Guarding;Sharp;Shooting;Aching;Tender;Throbbing Pain Intervention(s): Limited activity within patient's tolerance;Monitored during session;Premedicated before session;Repositioned    Home Living Family/patient expects to be discharged to:: Private residence Living Arrangements: Spouse/significant other Available Help at Discharge: Family;Available 24 hours/day Type of Home: House Home Access: Stairs to enter Entrance Stairs-Rails: None Entrance Stairs-Number of Steps: 1 Home Layout: One level;Other (Comment) (2 steps to bedroom) Home Equipment: Shower seat - built in;Cane - single point;Grab bars - tub/shower;Hand held shower head;Toilet riser;Bedside commode      Prior Function Level of Independence: Needs assistance   Gait / Transfers Assistance Needed: ambulates household distances with cane  ADL's / Homemaking Assistance Needed: daughter assists with bathing and dressing  Comments: likes to do puzzles     Hand Dominance   Dominant Hand: Right    Extremity/Trunk Assessment   Upper Extremity Assessment Upper Extremity Assessment: LUE deficits/detail;RUE deficits/detail RUE Deficits / Details: good grip, and wrist movement, all other  movement limited by pain LUE Deficits / Details: hemipeligia, only able to shoulder shrug contracture of L hand LUE: Unable to fully assess due to pain    Lower  Extremity Assessment Lower Extremity Assessment: RLE deficits/detail;LLE deficits/detail;Generalized weakness RLE Deficits / Details: limited to ankle pumps due to pain RLE: Unable to fully assess due to pain LLE Deficits / Details: ankle flex/ext, knee ext limited LLE: Unable to fully assess due to pain    Cervical / Trunk Assessment Cervical / Trunk Assessment: Kyphotic  Communication   Communication: No difficulties  Cognition Arousal/Alertness: Awake/alert Behavior During Therapy: Flat affect Overall Cognitive Status: Within Functional Limits for tasks assessed                                        General Comments General comments (skin integrity, edema, etc.): HR at 120 at rest max HR noted with mobility 146 bpm         Assessment/Plan    PT Assessment Patient needs continued PT services  PT Problem List Decreased range of motion;Decreased activity tolerance;Decreased strength;Decreased coordination;Cardiopulmonary status limiting activity;Pain       PT Treatment Interventions DME instruction;Gait training;Stair training;Functional mobility training;Therapeutic activities;Therapeutic exercise;Balance training;Cognitive remediation;Patient/family education    PT Goals (Current goals can be found in the Care Plan section)  Acute Rehab PT Goals Patient Stated Goal: have less pain PT Goal Formulation: With patient Time For Goal Achievement: 10/02/20 Potential to Achieve Goals: Fair    Frequency Min 3X/week    AM-PAC PT "6 Clicks" Mobility  Outcome Measure Help needed turning from your back to your side while in a flat bed without using bedrails?: A Lot Help needed moving from lying on your back to sitting on the side of a flat bed without using bedrails?: Total Help needed moving to and from a bed to a chair (including a wheelchair)?: Total Help needed standing up from a chair using your arms (e.g., wheelchair or bedside chair)?: Total Help needed  to walk in hospital room?: Total Help needed climbing 3-5 steps with a railing? : Total 6 Click Score: 7    End of Session   Activity Tolerance: Patient limited by pain Patient left: in bed;with call bell/phone within reach;with bed alarm set Nurse Communication: Mobility status;Patient requests pain meds PT Visit Diagnosis: Muscle weakness (generalized) (M62.81);Difficulty in walking, not elsewhere classified (R26.2);Pain;Hemiplegia and hemiparesis;Other abnormalities of gait and mobility (R26.89) Hemiplegia - Right/Left: Left Hemiplegia - dominant/non-dominant: Non-dominant Hemiplegia - caused by: Cerebral infarction Pain - Right/Left: Right Pain - part of body:  (RUQ incisional pain)    Time: 3267-1245 PT Time Calculation (min) (ACUTE ONLY): 28 min   Charges:   PT Evaluation $PT Eval Moderate Complexity: 1 Mod PT Treatments $Therapeutic Activity: 8-22 mins        Anjeli Casad B. Migdalia Dk PT, DPT Acute Rehabilitation Services Pager [phone] Office [phone]   [place] 09/18/2020, 11:42 AM

## 2020-09-18 NOTE — Evaluation (Signed)
Occupational Therapy Evaluation Patient Details Name: Mary Le MRN: [id number] DOB: [birth year] Today's Date: 09/18/2020    History of Present Illness 75 year old woman PMHx:CVA, back pain, HTN who presents with acute worsening of abdominal pain.  She has been having abdominal pain and vague nausea for 3 or 4 days, now acute onset of right-sided abdominal pain radiating from the right lower quadrant to the right upper quadrant into her shoulder and back. Admitted 09/16/20 for ex lap and repair of preforated pyloric channel ulcer. 09/17/20   Clinical Impression   Pt PTA: Pt lives with daughter and requires assist for bathing and dressing; pt was ambulatory with SPC prior. Pt currently, limited by severe pain in R flank/abdomen where JP drain placed and discomfort where NGT placed; decreased strength, decreased activity tolerance and decreased ability to care for self.  Pt maxA for rolling and bed mobility to EOB. Pt totalA for scooting to [facility]. Pt's LUE hemiplegia from previous CVA. Pt would benefit from continued OT skilled services. OT following acutely. ** Pt denying need for SNF and wants to go home with daughter- assuming pt will progress with therapy prior to d/c.    Follow Up Recommendations  Home health OT;Supervision/Assistance - 24 hour    Equipment Recommendations  Wheelchair (measurements OT);Wheelchair cushion (measurements OT);Hospital bed;Other (comment) (hoyer lift with pad (just incase based on progress))    Recommendations for Other Services       Precautions / Restrictions Precautions Precautions: None Precaution Comments: NG tube, JP drain on R side Restrictions Weight Bearing Restrictions: Yes LUE Weight Bearing: Non weight bearing Other Position/Activity Restrictions: pt unable to push with LUE from previous CVA      Mobility Bed Mobility Overal bed mobility: Needs Assistance Bed Mobility: Rolling;Sidelying to Sit;Sit to Sidelying Rolling: Max assist Sidelying  to sit: Total assist;HOB elevated     Sit to sidelying: Max assist General bed mobility comments: cues for sequencing and moments to pause as pt requires rest breaks and maxA to roll to L side and L UE unable to assist with trunk elevation. Assist for rolling to L and moving BLEs to EOB- pt able to initiate movement, but unable to perform task without assist.    Transfers                 General transfer comment: unable to attempt due to pain    Balance Overall balance assessment: Needs assistance Sitting-balance support: Feet unsupported;Single extremity supported;No upper extremity supported Sitting balance-Leahy Scale: Fair Sitting balance - Comments: able to tolerate only 5 min on EoB, before requesting to return to supine due to increased pain                                   ADL either performed or assessed with clinical judgement   ADL Overall ADL's : Needs assistance/impaired Eating/Feeding: Minimal assistance;Sitting   Grooming: Moderate assistance;Bed level;Sitting   Upper Body Bathing: Moderate assistance;Bed level   Lower Body Bathing: Total assistance;Sitting/lateral leans   Upper Body Dressing : Moderate assistance;Bed level   Lower Body Dressing: Total assistance;Sitting/lateral leans;Bed level   Toilet Transfer: Total assistance;+2 for physical assistance;+2 for safety/equipment Toilet Transfer Details (indicate cue type and reason): staff advised to use lift for transfer to [place] and Hygiene: Total assistance Toileting - Clothing Manipulation Details (indicate cue type and reason): pure wick in     Functional mobility during ADLs:  Maximal assistance;Cueing for safety;Cueing for sequencing General ADL Comments: Pt limited by severe pain in R flank/abdomen where JP drain placed and discomfort where NGT placed; decreased strength, decreased activity tolerance and decreased ability to care for self.      Vision Baseline Vision/History: No visual deficits;Wears glasses Wears Glasses: Distance only Patient Visual Report: No change from baseline Vision Assessment?: No apparent visual deficits Additional Comments: Pt could read white board without glasses on,     Perception     Praxis      Pertinent Vitals/Pain Pain Assessment: 0-10 Pain Score: 10-Worst pain ever Pain Location: surgical incision R chest below breast Pain Descriptors / Indicators: Operative site guarding;Grimacing;Guarding;Sharp;Shooting;Aching;Tender;Throbbing Pain Intervention(s): Limited activity within patient's tolerance;Monitored during session;Premedicated before session     Hand Dominance Right   Extremity/Trunk Assessment Upper Extremity Assessment Upper Extremity Assessment: LUE deficits/detail;RUE deficits/detail RUE Deficits / Details: good grip, and wrist movement, all other movement limited by pain LUE Deficits / Details: hemipeligia, only able to shoulder shrug contracture of L hand; edema noted   Lower Extremity Assessment RLE Deficits / Details: limited to ankle pumps due to pain RLE: Unable to fully assess due to pain LLE Deficits / Details: ankle flex/ext, knee ext limited LLE: Unable to fully assess due to pain   Cervical / Trunk Assessment Cervical / Trunk Assessment: Kyphotic   Communication Communication Communication: No difficulties   Cognition Arousal/Alertness: Awake/alert Behavior During Therapy: Flat affect Overall Cognitive Status: Within Functional Limits for tasks assessed                                     General Comments  HR 117-147 with EOB mobility    Exercises     Shoulder Instructions      Home Living Family/patient expects to be discharged to:: Private residence Living Arrangements: Spouse/significant other Available Help at Discharge: Family;Available 24 hours/day Type of Home: House Home Access: Stairs to enter [employer]  of Steps: 1 Entrance Stairs-Rails: None Home Layout: One level;Other (Comment) (2 steps to bedroom)     Bathroom Shower/Tub: Occupational psychologist: Standard Bathroom Accessibility: Yes   Home Equipment: Shower seat - built in;Cane - single point;Grab bars - tub/shower;Hand held shower head;Toilet riser;Bedside commode          Prior Functioning/Environment Level of Independence: Needs assistance  Gait / Transfers Assistance Needed: ambulates household distances with cane ADL's / Homemaking Assistance Needed: daughter assists with bathing and dressing   Comments: likes to do puzzles        OT Problem List: Decreased strength;Decreased activity tolerance;Impaired balance (sitting and/or standing);Decreased cognition;Decreased safety awareness;Cardiopulmonary status limiting activity      OT Treatment/Interventions: Self-care/ADL training;Therapeutic exercise;Energy conservation;DME and/or AE instruction;Therapeutic activities;Patient/family education;Balance training    OT Goals(Current goals can be found in the care plan section) Acute Rehab OT Goals Patient Stated Goal: have less pain OT Goal Formulation: With patient Time For Goal Achievement: 10/02/20 Potential to Achieve Goals: Good ADL Goals Pt Will Perform Eating: with set-up;sitting Pt Will Perform Grooming: with set-up;sitting Pt Will Perform Lower Body Dressing: with mod assist;with adaptive equipment;sitting/lateral leans;sit to/from stand Additional ADL Goal #1: Pt will perform bed mobility with modA overall to increase independence. Additional ADL Goal #2: Pt will perform ADL functional transfers with modA overall.  OT Frequency: Min 2X/week   Barriers to D/C:  Co-evaluation              AM-PAC OT "6 Clicks" Daily Activity     Outcome Measure Help from another person eating meals?: A Little Help from another person taking care of personal grooming?: A Little Help from another  person toileting, which includes using toliet, bedpan, or urinal?: Total Help from another person bathing (including washing, rinsing, drying)?: Total Help from another person to put on and taking off regular upper body clothing?: A Lot Help from another person to put on and taking off regular lower body clothing?: Total 6 Click Score: 11   End of Session Nurse Communication: Mobility status  Activity Tolerance: Patient limited by pain;Treatment limited secondary to medical complications (Comment) Patient left: in bed;with call bell/phone within reach  OT Visit Diagnosis: Unsteadiness on feet (R26.81);Muscle weakness (generalized) (M62.81);Pain                Time: 6578-4696 OT Time Calculation (min): 22 min Charges:  OT General Charges $OT Visit: 1 Visit OT Evaluation $OT Eval Moderate Complexity: 1 Mod  Jefferey Pica, OTR/L Acute Rehabilitation Services Pager: [phone] Office: [phone]   Jefferey Pica 09/18/2020, 4:26 PM

## 2020-09-18 NOTE — Progress Notes (Signed)
Pt requesting melatonin at bedtime. On call general surgery resident paged, Dr. Liborio Nixon. MD ordered melatonin 3mg  tablet per tube at bedtime. This nurse also asked MD if pt's foley should be removed in the AM as there is no order. MD stated to leave the foley until the day shift team has a chance to see the pt and decide if it should be removed.  Adella Hare, RN

## 2020-09-18 NOTE — Progress Notes (Signed)
Pt continues to complain of 9/10 pain in RUQ and R shoulder. PRN dilaudid Q2hrs and Ofirmev Q6hrs have been given. On call MD for general surgery, Dr. Liborio Nixon, paged and made aware. MD came to bedside and assessed pt. He assured the pt that we are doing everything we can. No new orders. Will continue to monitor. Adella Hare, RN

## 2020-09-18 NOTE — Progress Notes (Signed)
Foley catheter removed as per orders.  Pt due to void by 1600.

## 2020-09-18 NOTE — Progress Notes (Signed)
Surgical PA notified that pt's HR has been running 110-120s most of the day.  Bladder scan showed 56cc, urine output 50cc via purewick, pt is dry so measurement appears accurate.  Received order for 500cc bolus to be run over 2hrs.  Will continue to monitor.

## 2020-09-18 NOTE — Progress Notes (Signed)
Pt c/o RUQ pain, radiating to R upper back/shoulder.  States that she does not want the medication that she was given through the night (dilaudid) as she felt it "made the pain worse".  VSS.  NGT in place, no bowel sounds audible.  CSS PA notified, no changes made in pt's PRN pain meds or care mgt at this time.  CSS will prioritize pt on rounds.

## 2020-09-18 NOTE — Progress Notes (Signed)
1 Day Post-Op  Subjective: Complaining of pain that does not respond to dilaudid. No nausea/vomiting. Labs unremarkable. Afebrile, vitals stable.   Objective: Vital signs in last 24 hours: Temp:  [97.7 F (36.5 C)-98.6 F (37 C)] 97.7 F (36.5 C) (02/28 0713) Pulse Rate:  [92-103] 103 (02/28 0713) Resp:  [16-20] 18 (02/28 0713) BP: (134-154)/(36-72) 134/62 (02/28 0713) SpO2:  [94 %-99 %] 95 % (02/28 0713) Last BM Date: 09/15/19  Intake/Output from previous day: 02/27 0701 - 02/28 0700 In: 2902.4 [I.V.:2452.3; IV Piggyback:450.1] Out: 40 [Urine:660; Drains:40] Intake/Output this shift: Total I/O In: -  Out: 75 [Drains:75]  PE: General: resting comfortably, NAD Neuro: alert and oriented Resp: normal work of breathing on room air Abdomen: soft, nondistended, appropriately tender. Midline incision c/d/i with staples in place, no erythema or induration. JP with old blood in drain.  Lab Results:  Recent Labs    09/16/20 2154 09/17/20 0557  WBC 15.8* 10.6*  HGB 14.0 12.9  HCT 44.8 39.2  PLT 522* 372   BMET Recent Labs    09/16/20 2154 09/17/20 0557  NA 134* 138  K 4.4 4.3  CL 103 105  CO2 19* 21*  GLUCOSE 141* 148*  BUN 17 14  CREATININE 0.79 0.80  CALCIUM 9.1 8.5*   PT/INR Recent Labs    09/16/20 2154  LABPROT 13.0  INR 1.0   CMP     Component Value Date/Time   NA 138 09/17/2020 0557   NA 145 (H) 08/19/2017 1411   K 4.3 09/17/2020 0557   CL 105 09/17/2020 0557   CO2 21 (L) 09/17/2020 0557   GLUCOSE 148 (H) 09/17/2020 0557   BUN 14 09/17/2020 0557   BUN 13 08/19/2017 1411   CREATININE 0.80 09/17/2020 0557   CREATININE 0.76 01/15/2015 1232   CALCIUM 8.5 (L) 09/17/2020 0557   PROT 7.1 09/16/2020 2154   PROT 7.4 09/02/2018 1513   ALBUMIN 3.2 (L) 09/16/2020 2154   ALBUMIN 4.1 09/02/2018 1513   AST 19 09/16/2020 2154   ALT 15 09/16/2020 2154   ALKPHOS 52 09/16/2020 2154   BILITOT 0.6 09/16/2020 2154   BILITOT 0.2 09/02/2018 1513    GFRNONAA >60 09/17/2020 0557   GFRNONAA 78 01/03/2014 1614   GFRAA >60 07/13/2018 1416   GFRAA >89 01/03/2014 1614   Lipase     Component Value Date/Time   LIPASE 122 (H) 09/16/2020 2154       Studies/Results: CT Abdomen Pelvis W Contrast  Result Date: 09/17/2020 CLINICAL DATA:  Nonlocalized abdominal pain EXAM: CT ABDOMEN AND PELVIS WITH CONTRAST TECHNIQUE: Multidetector CT imaging of the abdomen and pelvis was performed using the standard protocol following bolus administration of intravenous contrast. CONTRAST:  126mL OMNIPAQUE IOHEXOL 300 MG/ML  SOLN COMPARISON:  None. FINDINGS: Lower chest: The visualized heart size within normal limits. No pericardial fluid/thickening. No hiatal hernia. Bibasilar dependent atelectasis is noted. Hepatobiliary: The liver is normal in density without focal abnormality.The main portal vein is patent. No evidence of calcified gallstones, gallbladder wall thickening or biliary dilatation. Pancreas: Unremarkable. No pancreatic ductal dilatation or surrounding inflammatory changes. Spleen: Normal in size without focal abnormality. Adrenals/Urinary Tract: Both adrenal glands appear normal. Multiple bilateral parapelvic cysts are noted. No renal or collecting system calculi are seen. The bladder is unremarkable. Stomach/Bowel: There is diffuse wall thickening seen at the distal gastric body and gastroduodenal junction. There is surrounding mild mesenteric fat stranding changes. At the gastroduodenal junction there appears to be a focal outpouching best  seen on series 3, image 28 with perforation and a ill-defined fluid collection with air. There is also free air seen extending along the anterior gastric body and around the perihepatic space. The remainder of the small bowel and colon are unremarkable. Vascular/Lymphatic: There are no enlarged mesenteric, retroperitoneal, or pelvic lymph nodes. Scattered aortic atherosclerotic calcifications are seen without aneurysmal  dilatation. Reproductive: The uterus and adnexa are unremarkable. Other: As described above small amount of pneumoperitoneum within the upper to mid abdomen. For Musculoskeletal: No acute or significant osseous findings. IMPRESSION: Findings suggestive of a gastritis with perforated gastroduodenal diverticulum/ulcer with adjacent phlegmon. Small amount of pneumoperitoneum within the upper and mid abdomen. Aortic Atherosclerosis (ICD10-I70.0). These results were called by telephone at the time of interpretation on 09/17/2020 at 12:04 am to provider Dr. Dina Rich, who verbally acknowledged these results. Electronically Signed   By: Prudencio Pair M.D.   On: 09/17/2020 00:04   DG Chest Portable 1 View  Result Date: 09/16/2020 CLINICAL DATA:  Chest pain. EXAM: PORTABLE CHEST 1 VIEW COMPARISON:  January 15, 2015. FINDINGS: The heart size and mediastinal contours are within normal limits. No pneumothorax or pleural effusion is noted. Right lung is clear. Minimal left lingular subsegmental atelectasis is noted. The visualized skeletal structures are unremarkable. IMPRESSION: Minimal left lingular subsegmental atelectasis. Electronically Signed   By: Marijo Conception M.D.   On: 09/16/2020 22:18    Anti-infectives: Anti-infectives (From admission, onward)   Start     Dose/Rate Route Frequency Ordered Stop   09/17/20 0600  piperacillin-tazobactam (ZOSYN) IVPB 3.375 g       Note to Pharmacy: Pharmacy to adjust dose as indicated   3.375 g 12.5 mL/hr over 240 Minutes Intravenous Every 8 hours 09/17/20 0506     09/17/20 0015  piperacillin-tazobactam (ZOSYN) IVPB 3.375 g        3.375 g 100 mL/hr over 30 Minutes Intravenous  Once 09/17/20 0003 09/17/20 0114       Assessment/Plan 75 yo female with perforated gastric ulcer POD2 s/p ex lap and Engineer, structural. - Continue NG tube to suction - Plan for upper GI on POD5, keep NG until then - NPO, IV fluids - Continue zosyn - Remove foley - Pain control: switch from  dilaudid to prn morphine - BID PPI - Dispo: inpatient   LOS: 1 day    Michaelle Birks, MD [facility] General, Hepatobiliary and Pancreatic Surgery 09/18/20 9:18 AM

## 2020-09-19 ENCOUNTER — Inpatient Hospital Stay (HOSPITAL_COMMUNITY): Payer: Medicare Other

## 2020-09-19 DIAGNOSIS — M7989 Other specified soft tissue disorders: Secondary | ICD-10-CM

## 2020-09-19 DIAGNOSIS — L8990 Pressure ulcer of unspecified site, unspecified stage: Secondary | ICD-10-CM | POA: Insufficient documentation

## 2020-09-19 LAB — BASIC METABOLIC PANEL
Anion gap: 9 (ref 5–15)
BUN: 12 mg/dL (ref 8–23)
CO2: 19 mmol/L — ABNORMAL LOW (ref 22–32)
Calcium: 7.4 mg/dL — ABNORMAL LOW (ref 8.9–10.3)
Chloride: 112 mmol/L — ABNORMAL HIGH (ref 98–111)
Creatinine, Ser: 0.7 mg/dL (ref 0.44–1.00)
GFR, Estimated: >60 mL/min (ref >60)
Glucose, Bld: 85 mg/dL (ref 70–99)
Potassium: 3.4 mmol/L — ABNORMAL LOW (ref 3.5–5.1)
Sodium: 140 mmol/L (ref 135–145)

## 2020-09-19 LAB — GLUCOSE, CAPILLARY
Glucose-Capillary: 70 mg/dL (ref 70–99)
Glucose-Capillary: 99 mg/dL (ref 70–99)
Glucose-Capillary: 106 mg/dL — ABNORMAL HIGH (ref 70–99)
Glucose-Capillary: 126 mg/dL — ABNORMAL HIGH (ref 70–99)
Glucose-Capillary: 142 mg/dL — ABNORMAL HIGH (ref 70–99)

## 2020-09-19 LAB — H. PYLORI ANTIBODY, IGG: H Pylori IgG: 0.23 Index Value (ref 0.00–0.79)

## 2020-09-19 MED ORDER — METOPROLOL TARTRATE 5 MG/5ML IV SOLN
5.0000 mg | Freq: Four times a day (QID) | INTRAVENOUS | Status: DC
  Administered 2020-09-19 – 2020-09-23 (×16): 5 mg via INTRAVENOUS
  Filled 2020-09-19 (×16): qty 5

## 2020-09-19 MED ORDER — ORAL CARE MOUTH RINSE
15.0000 mL | Freq: Two times a day (BID) | OROMUCOSAL | Status: DC
  Administered 2020-09-19 – 2020-09-28 (×16): 15 mL via OROMUCOSAL

## 2020-09-19 MED ORDER — POTASSIUM CHLORIDE IN NACL 20-0.45 MEQ/L-% IV SOLN
INTRAVENOUS | Status: DC
  Filled 2020-09-19: qty 1000

## 2020-09-19 MED ORDER — KCL IN DEXTROSE-NACL 20-5-0.45 MEQ/L-%-% IV SOLN
INTRAVENOUS | Status: DC
  Filled 2020-09-19 (×9): qty 1000

## 2020-09-19 MED ORDER — LORAZEPAM 2 MG/ML IJ SOLN
0.2500 mg | Freq: Every evening | INTRAMUSCULAR | Status: DC | PRN
  Administered 2020-09-19: 0.25 mg via INTRAVENOUS
  Filled 2020-09-19: qty 1

## 2020-09-19 MED ORDER — SODIUM CHLORIDE 0.9 % IV BOLUS
500.0000 mL | Freq: Once | INTRAVENOUS | Status: AC
  Administered 2020-09-19: 500 mL via INTRAVENOUS

## 2020-09-19 MED ORDER — PHENOL 1.4 % MT LIQD
1.0000 | OROMUCOSAL | Status: DC | PRN
  Administered 2020-09-19 (×2): 1 via OROMUCOSAL
  Filled 2020-09-19: qty 177

## 2020-09-19 MED ORDER — CHLORHEXIDINE GLUCONATE 0.12 % MT SOLN
15.0000 mL | Freq: Two times a day (BID) | OROMUCOSAL | Status: DC
  Administered 2020-09-19 – 2020-09-26 (×12): 15 mL via OROMUCOSAL
  Filled 2020-09-19 (×13): qty 15

## 2020-09-19 NOTE — Progress Notes (Signed)
    0845 NT notified RN that MEWS was yellow, BS 70.  0900 RN paged Claiborne Billings, [place] from [place]. Informed about possible yellow MEWS and BS, new orders received. WCTM

## 2020-09-19 NOTE — Progress Notes (Signed)
No urine output 2 hours after NS bolus.Bladder scan showed 199 cc.Edward Qualia MD made aware with order draw BMET. Will continue to monitor.

## 2020-09-19 NOTE — Progress Notes (Signed)
Patient has no UO since midnight,only 80 cc for the last 12 hours,Burke Thompson MD updated including latest BMET result this morning.Will continue to monitor.

## 2020-09-19 NOTE — Progress Notes (Signed)
Physical Therapy Treatment Patient Details Name: Mary Le MRN: [id number] DOB: 09-26-1945 Today's Date: 09/19/2020    History of Present Illness 75 year old woman PMHx:CVA with LUE/LE weakness, back pain, HTN who presents with acute worsening of abdominal pain.  She has been having abdominal pain and vague nausea for 3 or 4 days, now acute onset of right-sided abdominal pain radiating from the right lower quadrant to the right upper quadrant into her shoulder and back. Admitted 09/16/20 for ex lap and repair of preforated pyloric channel ulcer.    PT Comments    Pt received in supine, agreeable to therapy session with encouragement and with fair participation and tolerance for mobility. Pt performed rolling to L/R sides x2 reps ea during hygiene assist and for pad repositioning with +43max to totalA needed (increased assist to roll to R due to L weakness). Pt performed supine<>EOB transfer via L log roll with +2 totalA, limited due to increased pain and with tachycardia up to 140 bpm, pt with poor tolerance for seated position this date due to pain from NG tube, abdominal incision and R shoulder pain and sat up <1 minute prior to requesting return to supine. Unable to assess standing/transfers this date, will plan to initiate standing trial at [facility] next session if pain control improves. Pt continues to benefit from PT services to progress toward functional mobility goals. Will continue to assess disposition, pt not yet safe to DC home as she has been unable to tolerate transfers yet.   Follow Up Recommendations  Home health PT;Supervision/Assistance - 24 hour (pending progress)     Equipment Recommendations  Other (comment) (possible hemi-walker, continue to assess)    Recommendations for Other Services       Precautions / Restrictions Precautions Precautions: Fall Precaution Comments: NG tube, JP drain on R side, LUE>LLE weakness Restrictions Weight Bearing Restrictions: No Other  Position/Activity Restrictions: LUE weakness    Mobility  Bed Mobility Overal bed mobility: Needs Assistance Bed Mobility: Rolling;Sidelying to Sit;Sit to Sidelying Rolling: Max assist;+2 for physical assistance Sidelying to sit: Total assist;HOB elevated;+2 for physical assistance     Sit to sidelying: Total assist;+2 for physical assistance General bed mobility comments: multimodal cues for sequencing and increased time to perform; pt requires rest breaks and maxA to roll to L side and total A to roll to R for transfer pad repostioning and posterior supine scoot; max assist for rolling to L and moving BLEs to EOB- pt able to initiate movement, but unable to perform task without assist.    Transfers                 General transfer comment: unable to attempt due to pain and pt tachycardia with static sitting  Ambulation/Gait                 Stairs             Wheelchair Mobility    Modified Rankin (Stroke Patients Only)       Balance Overall balance assessment: Needs assistance Sitting-balance support: Feet unsupported;Single extremity supported;No upper extremity supported Sitting balance-Leahy Scale: Zero Sitting balance - Comments: pt tolerated static sitting EOB ~30 sec with heavy L lean and needed max to totalA trunk support to remain upright, HR elevated and pt unable to attempt standing this date  Cognition Arousal/Alertness: Awake/alert Behavior During Therapy: Flat affect;Anxious Overall Cognitive Status: No family/caregiver present to determine baseline cognitive functioning                                 General Comments: pt significantly anxious and pain limited, during transfer to EOB verbalized fear of falls; pt needs max multimodal cues for task sequencing      Exercises General Exercises - Lower Extremity Ankle Circles/Pumps: AROM;AAROM;Both;15 reps;Supine (AA on LLE,  AROM on RLE) Heel Slides: AROM;AAROM;Both;10 reps;Supine (AA on L, AROM on RLE) Hip ABduction/ADduction: AROM;AAROM;10 reps;Right;Supine (AA on L x5 reps, AROM on R x10 reps)    General Comments General comments (skin integrity, edema, etc.): HR 119-140 bpm during static sitting/rolling; SpO2 95-100% on RA; BP 113/65 (82) prior to EOB transfer and denies dizziness; fear of falling and poor tolerance for seated posture this date      Pertinent Vitals/Pain Pain Assessment: 0-10 Pain Score: 6  Pain Location: R shoulder>surgical incision and skin at B chest below breasts Pain Descriptors / Indicators: Operative site guarding;Grimacing;Aching;Tender;Moaning;Discomfort Pain Intervention(s): Monitored during session;Limited activity within patient's tolerance;Repositioned;RN gave pain meds during session (pillow behind L back/hip to offload pressure at end of session, RN placed lidocaine patch to R shoulder during session)    Home Living                      Prior Function            PT Goals (current goals can now be found in the care plan section) Acute Rehab PT Goals Patient Stated Goal: have less pain PT Goal Formulation: With patient Time For Goal Achievement: 10/02/20 Potential to Achieve Goals: Fair Progress towards PT goals: Not progressing toward goals - comment (limited due to pain)    Frequency    Min 3X/week      PT Plan Current plan remains appropriate    Co-evaluation              AM-PAC PT "6 Clicks" Mobility   Outcome Measure  Help needed turning from your back to your side while in a flat bed without using bedrails?: A Lot Help needed moving from lying on your back to sitting on the side of a flat bed without using bedrails?: Total Help needed moving to and from a bed to a chair (including a wheelchair)?: Total Help needed standing up from a chair using your arms (e.g., wheelchair or bedside chair)?: Total Help needed to walk in hospital room?:  Total Help needed climbing 3-5 steps with a railing? : Total 6 Click Score: 7    End of Session Equipment Utilized During Treatment:  (could benefit from podus boot for LLE in bed, RN notified) Activity Tolerance: Patient limited by pain;Treatment limited secondary to medical complications (Comment);Other (comment) (HR elevated with seated posture) Patient left: in bed;with call bell/phone within reach;with bed alarm set Nurse Communication: Mobility status;Other (comment) (pt needs podus boot) PT Visit Diagnosis: Muscle weakness (generalized) (M62.81);Difficulty in walking, not elsewhere classified (R26.2);Pain;Hemiplegia and hemiparesis;Other abnormalities of gait and mobility (R26.89) Hemiplegia - Right/Left: Left Hemiplegia - dominant/non-dominant: Non-dominant Hemiplegia - caused by: Cerebral infarction Pain - Right/Left: Right Pain - part of body: Shoulder (and RUQ incisional pain)     Time: 0973-5329 PT Time Calculation (min) (ACUTE ONLY): 33 min  Charges:  $Therapeutic Exercise: 8-22 mins $Therapeutic Activity: 8-22 mins  Houston Siren., PTA Acute Rehabilitation Services Pager: [phone] Office: [place] 09/19/2020, 12:02 PM

## 2020-09-19 NOTE — Progress Notes (Signed)
Upper extremity venous has been completed.   Preliminary results in CV Proc.   [name] 09/19/2020 10:07 AM

## 2020-09-19 NOTE — Progress Notes (Signed)
Patient has only 80 cc urine output(purewick) after 6 hours of 500 cc NS bolus from day shift,bladder scan showed 143 cc,HR at 110's per min.Georganna Skeans MD made aware with order.Normal saline 500 cc given.Will continue to monitor.

## 2020-09-19 NOTE — Progress Notes (Signed)
Progress Note  2 Days Post-Op  Subjective: Patient reports that morphine really helped her pain yesterday and she would like to continue this while unable to take PO pain meds. She reports pain is a little better each day. UOP improved overnight. She is having some trouble sleeping, takes melatonin at home. She is also concerned that LUE appears swollen and reports it is chronically contracted since CVA. Passing some flatus.   Objective: Vital signs in last 24 hours: Temp:  [98 F (36.7 C)-98.7 F (37.1 C)] 98.4 F (36.9 C) (03/01 0300) Pulse Rate:  [100-125] 101 (03/01 0300) Resp:  [15-20] 15 (03/01 0300) BP: (105-163)/(47-87) 147/63 (03/01 0300) SpO2:  [93 %-96 %] 94 % (03/01 0300) Last BM Date: 09/14/20  Intake/Output from previous day: 02/28 0701 - 03/01 0700 In: 1500 [I.V.:1400; IV Piggyback:100] Out: 425 [Urine:130; Emesis/NG output:120; Drains:175] Intake/Output this shift: Total I/O In: -  Out: 200 [Urine:150; Drains:50]  PE: General: pleasant, WD, obese female who is laying in bed in NAD Heart: regular, rate, and rhythm.   Lungs: CTAB, no wheezes, rhonchi, or rales noted.  Respiratory effort nonlabored Abd: soft, appropriately ttp, ND, BS hypoactive, midline incision with honeycomb present, drain in RLQ with SS fluid, NGT with thin bilious fluid  MS: contracture of LUE with mild edema  Skin: warm and dry with no masses, lesions, or rashes Psych: A&Ox3 with an appropriate affect.    Lab Results:  Recent Labs    09/16/20 2154 09/17/20 0557  WBC 15.8* 10.6*  HGB 14.0 12.9  HCT 44.8 39.2  PLT 522* 372   BMET Recent Labs    09/17/20 0557 09/19/20 0358  NA 138 140  K 4.3 3.4*  CL 105 112*  CO2 21* 19*  GLUCOSE 148* 85  BUN 14 12  CREATININE 0.80 0.70  CALCIUM 8.5* 7.4*   PT/INR Recent Labs    09/16/20 2154  LABPROT 13.0  INR 1.0   CMP     Component Value Date/Time   NA 140 09/19/2020 0358   NA 145 (H) 08/19/2017 1411   K 3.4 (L)  09/19/2020 0358   CL 112 (H) 09/19/2020 0358   CO2 19 (L) 09/19/2020 0358   GLUCOSE 85 09/19/2020 0358   BUN 12 09/19/2020 0358   BUN 13 08/19/2017 1411   CREATININE 0.70 09/19/2020 0358   CREATININE 0.76 01/15/2015 1232   CALCIUM 7.4 (L) 09/19/2020 0358   PROT 7.1 09/16/2020 2154   PROT 7.4 09/02/2018 1513   ALBUMIN 3.2 (L) 09/16/2020 2154   ALBUMIN 4.1 09/02/2018 1513   AST 19 09/16/2020 2154   ALT 15 09/16/2020 2154   ALKPHOS 52 09/16/2020 2154   BILITOT 0.6 09/16/2020 2154   BILITOT 0.2 09/02/2018 1513   GFRNONAA >60 09/19/2020 0358   GFRNONAA 78 01/03/2014 1614   GFRAA >60 07/13/2018 1416   GFRAA >89 01/03/2014 1614   Lipase     Component Value Date/Time   LIPASE 122 (H) 09/16/2020 2154       Studies/Results: No results found.  Anti-infectives: Anti-infectives (From admission, onward)   Start     Dose/Rate Route Frequency Ordered Stop   09/17/20 0600  piperacillin-tazobactam (ZOSYN) IVPB 3.375 g       Note to Pharmacy: Pharmacy to adjust dose as indicated   3.375 g 12.5 mL/hr over 240 Minutes Intravenous Every 8 hours 09/17/20 0506     09/17/20 0015  piperacillin-tazobactam (ZOSYN) IVPB 3.375 g        3.375 g  100 mL/hr over 30 Minutes Intravenous  Once 09/17/20 0003 09/17/20 0114       Assessment/Plan HTN Hx of CVA Depression/Anxiety AKI, mild - Cr 0.7, UOP improving, continue IVF while NPO Milk hypokalemia - K 3.4 this AM, change IVF to containing K LUE swelling - contracture s/p previous CVA, check [place] to r/o DVT, elevate as able   Perforated gastric ulcer S/P exploratory laparotomy with Phillip Heal patch repair 2/27 Dr. Kae Heller - POD#3 - Continue to mobilize as able - drain with 175 cc out, SS - UGI on POD4-5 - continue BID PPI - H. Pylori pending   FEN: NPO, IVF, NGT to LIWS VTE: lovenox ID: Zosyn 2/27>>  LOS: 2 days    Norm Parcel , [facility] 09/19/2020, 8:00 AM Please see Amion for pager number during day hours  7:00am-4:30pm

## 2020-09-20 LAB — BASIC METABOLIC PANEL
Anion gap: 7 (ref 5–15)
BUN: 8 mg/dL (ref 8–23)
CO2: 20 mmol/L — ABNORMAL LOW (ref 22–32)
Calcium: 7.8 mg/dL — ABNORMAL LOW (ref 8.9–10.3)
Chloride: 111 mmol/L (ref 98–111)
Creatinine, Ser: 0.63 mg/dL (ref 0.44–1.00)
GFR, Estimated: >60 mL/min (ref >60)
Glucose, Bld: 159 mg/dL — ABNORMAL HIGH (ref 70–99)
Potassium: 3.6 mmol/L (ref 3.5–5.1)
Sodium: 138 mmol/L (ref 135–145)

## 2020-09-20 LAB — GLUCOSE, CAPILLARY
Glucose-Capillary: 144 mg/dL — ABNORMAL HIGH (ref 70–99)
Glucose-Capillary: 128 mg/dL — ABNORMAL HIGH (ref 70–99)
Glucose-Capillary: 128 mg/dL — ABNORMAL HIGH (ref 70–99)
Glucose-Capillary: 138 mg/dL — ABNORMAL HIGH (ref 70–99)
Glucose-Capillary: 113 mg/dL — ABNORMAL HIGH (ref 70–99)

## 2020-09-20 LAB — CBC
HCT: 32.3 % — ABNORMAL LOW (ref 36.0–46.0)
Hemoglobin: 10.7 g/dL — ABNORMAL LOW (ref 12.0–15.0)
MCH: 27 pg (ref 26.0–34.0)
MCHC: 33.1 g/dL (ref 30.0–36.0)
MCV: 81.6 fL (ref 80.0–100.0)
Platelets: 408 10*3/uL — ABNORMAL HIGH (ref 150–400)
RBC: 3.96 MIL/uL (ref 3.87–5.11)
RDW: 14.4 % (ref 11.5–15.5)
WBC: 11.2 10*3/uL — ABNORMAL HIGH (ref 4.0–10.5)
nRBC: 0 % (ref 0.0–0.2)

## 2020-09-20 MED ORDER — MENTHOL 3 MG MT LOZG
1.0000 | LOZENGE | OROMUCOSAL | Status: DC | PRN
  Filled 2020-09-20: qty 9

## 2020-09-20 MED ORDER — LORAZEPAM 2 MG/ML IJ SOLN
0.5000 mg | Freq: Every evening | INTRAMUSCULAR | Status: DC | PRN
  Administered 2020-09-20 – 2020-09-21 (×2): 0.5 mg via INTRAVENOUS
  Filled 2020-09-20 (×2): qty 1

## 2020-09-20 MED ORDER — POTASSIUM CHLORIDE 10 MEQ/100ML IV SOLN
10.0000 meq | INTRAVENOUS | Status: AC
Start: 2020-09-20 — End: 2020-09-20
  Administered 2020-09-20 (×2): 10 meq via INTRAVENOUS
  Filled 2020-09-20 (×2): qty 100

## 2020-09-20 NOTE — Progress Notes (Signed)
Progress Note  3 Days Post-Op  Subjective: Patient reports abdominal pain still well controlled with morphine. She is having a lot of soreness in throat from NGT. She is passing some flatus still. Got up to side of bed yesterday. UOP still improving. She was still unable to sleep last night.   Objective: Vital signs in last 24 hours: Temp:  [98 F (36.7 C)-98.6 F (37 C)] 98.1 F (36.7 C) (03/02 0829) Pulse Rate:  [100-114] 109 (03/02 0829) Resp:  [16-20] 18 (03/02 0829) BP: (147-176)/(44-84) 175/76 (03/02 0829) SpO2:  [96 %-100 %] 100 % (03/02 0829) Last BM Date: 09/14/20  Intake/Output from previous day: 03/01 0701 - 03/02 0700 In: 1929.1 [I.V.:1762; IV Piggyback:167.1] Out: 1080 [Urine:850; Emesis/NG output:95; Drains:135] Intake/Output this shift: No intake/output data recorded.  PE: General: pleasant, WD, obese female who is laying in bed in NAD Heart: regular, rate, and rhythm.   Lungs: CTAB, no wheezes, rhonchi, or rales noted.  Respiratory effort nonlabored Abd: soft, appropriately ttp, ND, BS hypoactive, midline incision with honeycomb present, drain in RLQ with SS fluid, NGT with thin bilious fluid  MS: contracture of LUE with mild edema  Skin: warm and dry with no masses, lesions, or rashes Psych: A&Ox3 with an appropriate affect.   Lab Results:  Recent Labs    09/20/20 0500  WBC 11.2*  HGB 10.7*  HCT 32.3*  PLT 408*   BMET Recent Labs    09/19/20 0358 09/20/20 0500  NA 140 138  K 3.4* 3.6  CL 112* 111  CO2 19* 20*  GLUCOSE 85 159*  BUN 12 8  CREATININE 0.70 0.63  CALCIUM 7.4* 7.8*   PT/INR No results for input(s): LABPROT, INR in the last 72 hours. CMP     Component Value Date/Time   NA 138 09/20/2020 0500   NA 145 (H) 08/19/2017 1411   K 3.6 09/20/2020 0500   CL 111 09/20/2020 0500   CO2 20 (L) 09/20/2020 0500   GLUCOSE 159 (H) 09/20/2020 0500   BUN 8 09/20/2020 0500   BUN 13 08/19/2017 1411   CREATININE 0.63 09/20/2020 0500    CREATININE 0.76 01/15/2015 1232   CALCIUM 7.8 (L) 09/20/2020 0500   PROT 7.1 09/16/2020 2154   PROT 7.4 09/02/2018 1513   ALBUMIN 3.2 (L) 09/16/2020 2154   ALBUMIN 4.1 09/02/2018 1513   AST 19 09/16/2020 2154   ALT 15 09/16/2020 2154   ALKPHOS 52 09/16/2020 2154   BILITOT 0.6 09/16/2020 2154   BILITOT 0.2 09/02/2018 1513   GFRNONAA >60 09/20/2020 0500   GFRNONAA 78 01/03/2014 1614   GFRAA >60 07/13/2018 1416   GFRAA >89 01/03/2014 1614   Lipase     Component Value Date/Time   LIPASE 122 (H) 09/16/2020 2154       Studies/Results: VAS [place] UPPER EXTREMITY VENOUS DUPLEX  Result Date: 09/19/2020 UPPER VENOUS STUDY  Indications: Swelling Limitations: Pt arm contacted. Comparison Study: no prior Performing Technologist: [name] RVS  Examination Guidelines: A complete evaluation includes B-mode imaging, spectral Doppler, color Doppler, and power Doppler as needed of all accessible portions of each vessel. Bilateral testing is considered an integral part of a complete examination. Limited examinations for reoccurring indications may be performed as noted.  Left Findings: +----------+------------+---------+-----------+----------+--------------+ LEFT      CompressiblePhasicitySpontaneousProperties   Summary     +----------+------------+---------+-----------+----------+--------------+ IJV           Full       Yes  Yes                             +----------+------------+---------+-----------+----------+--------------+ Subclavian    Full       Yes       Yes                             +----------+------------+---------+-----------+----------+--------------+ Axillary      Full       Yes       Yes                             +----------+------------+---------+-----------+----------+--------------+ Brachial      Full       Yes       Yes                             +----------+------------+---------+-----------+----------+--------------+ Radial        Full                                                  +----------+------------+---------+-----------+----------+--------------+ Ulnar         Full                                                 +----------+------------+---------+-----------+----------+--------------+ Cephalic                                            Not visualized +----------+------------+---------+-----------+----------+--------------+ Basilic       Full                                                 +----------+------------+---------+-----------+----------+--------------+  Summary:  Left: No evidence of deep vein thrombosis in the upper extremity. No evidence of superficial vein thrombosis in the upper extremity. No evidence of thrombosis in the subclavian.  *See table(s) above for measurements and observations.  Diagnosing physician: Curt Jews MD Electronically signed by Curt Jews MD on 09/19/2020 at 7:20:50 PM.    Final     Anti-infectives: Anti-infectives (From admission, onward)   Start     Dose/Rate Route Frequency Ordered Stop   09/17/20 0600  piperacillin-tazobactam (ZOSYN) IVPB 3.375 g       Note to Pharmacy: Pharmacy to adjust dose as indicated   3.375 g 12.5 mL/hr over 240 Minutes Intravenous Every 8 hours 09/17/20 0506     09/17/20 0015  piperacillin-tazobactam (ZOSYN) IVPB 3.375 g        3.375 g 100 mL/hr over 30 Minutes Intravenous  Once 09/17/20 0003 09/17/20 0114       Assessment/Plan HTN Hx of CVA Depression/Anxiety - increased ativan qhs prn to 0.5 mg AKI, mild - Cr 0.63, UOP improving, continue IVF while NPO Milk hypokalemia - K 3.6 this AM, continue IVF containing K + 2 runs IV  K LUE swelling - contracture s/p previous CVA, [place] negative for DVT Hypoglycemia - added dextrose to fluids, continue to monitor, improving   Perforated gastric ulcer S/P exploratory laparotomy with Phillip Heal patch repair 2/27 Dr. Kae Heller - POD#4 - Continue to mobilize as able - drain with 135 cc out, SS - UGI  on POD5 - continue BID PPI - H. Pylori negative  FEN: NPO, IVF, NGT to LIWS VTE: lovenox ID: Zosyn 2/27>3/2  LOS: 3 days    Norm Parcel , [facility] 09/20/2020, 8:55 AM Please see Amion for pager number during day hours 7:00am-4:30pm

## 2020-09-20 NOTE — Progress Notes (Signed)
Occupational Therapy Treatment Patient Details Name: Mary Le MRN: [id number] DOB: 01-12-1946 Today's Date: 09/20/2020    History of present illness 75 year old woman PMHx:CVA with LUE/LE weakness, back pain, HTN who presents with acute worsening of abdominal pain.  She has been having abdominal pain and vague nausea for 3 or 4 days, now acute onset of right-sided abdominal pain radiating from the right lower quadrant to the right upper quadrant into her shoulder and back. Admitted 09/16/20 for ex lap and repair of preforated pyloric channel ulcer.   OT comments  Patient continues to be limited by incisional pain and anxiety regarding pain.  Patient tired hard, and the focus of the session was on AROM to R hemibody, and AAROM to L hemibody.  Patient also attempted rolling side to side for three attempts with Max A.  Her plan remains to go home with 24 hour assist as needed from family.  Acute OT indicated to maximize functional status to ensure a safe transition home.    Follow Up Recommendations  Home health OT;Supervision/Assistance - 24 hour    Equipment Recommendations  Wheelchair (measurements OT);Wheelchair cushion (measurements OT);Hospital bed;Other (comment)    Recommendations for Other Services      Precautions / Restrictions Precautions Precaution Comments: NG tube, JP drain on R side, LUE>LLE weakness Restrictions Weight Bearing Restrictions: No Other Position/Activity Restrictions: LUE non functional arm - contracted.  Has a splint.       Mobility Bed Mobility Overal bed mobility: Needs Assistance Bed Mobility: Rolling Rolling: Max assist         General bed mobility comments: unable to achieve edgo of bed sitting with one assist.  Not attempted due to level of assist and patient's pain level.    Transfers                         Vision       Perception     Praxis      Cognition Arousal/Alertness: Awake/alert Behavior During Therapy:  Anxious Overall Cognitive Status: Within Functional Limits for tasks assessed                                                            Pertinent Vitals/ Pain       Pain Assessment: Faces Faces Pain Scale: Hurts even more Pain Descriptors / Indicators: Operative site guarding;Grimacing;Discomfort Pain Intervention(s): Monitored during session                                                          Frequency  Min 2X/week        Progress Toward Goals  OT Goals(current goals can now be found in the care plan section)     Acute Rehab OT Goals Patient Stated Goal: I know I have to try and stand up. OT Goal Formulation: With patient Time For Goal Achievement: 10/02/20 Potential to Achieve Goals: Good  Plan Discharge plan remains appropriate    Co-evaluation                 AM-PAC OT "6 Clicks" Daily Activity  Outcome Measure   Help from another person eating meals?: A Little Help from another person taking care of personal grooming?: A Little Help from another person toileting, which includes using toliet, bedpan, or urinal?: Total Help from another person bathing (including washing, rinsing, drying)?: Total Help from another person to put on and taking off regular upper body clothing?: Total Help from another person to put on and taking off regular lower body clothing?: Total 6 Click Score: 10    End of Session    OT Visit Diagnosis: Unsteadiness on feet (R26.81);Muscle weakness (generalized) (M62.81);Pain   Activity Tolerance Patient limited by pain   Patient Left in bed;with call bell/phone within reach;with nursing/sitter in room;with family/visitor present   Nurse Communication          Time: 1747-1595 OT Time Calculation (min): 13 min  Charges: OT General Charges $OT Visit: 1 Visit OT Treatments $Therapeutic Activity: 8-22 mins  09/20/2020  Rich, OTR/L  Acute Rehabilitation  Services  Office:  [place] 09/20/2020, 5:02 PM

## 2020-09-21 ENCOUNTER — Inpatient Hospital Stay (HOSPITAL_COMMUNITY): Payer: Medicare Other

## 2020-09-21 LAB — BASIC METABOLIC PANEL
Anion gap: 6 (ref 5–15)
BUN: <5 mg/dL — ABNORMAL LOW (ref 8–23)
CO2: 19 mmol/L — ABNORMAL LOW (ref 22–32)
Calcium: 7.2 mg/dL — ABNORMAL LOW (ref 8.9–10.3)
Chloride: 110 mmol/L (ref 98–111)
Creatinine, Ser: 0.49 mg/dL (ref 0.44–1.00)
GFR, Estimated: >60 mL/min (ref >60)
Glucose, Bld: 131 mg/dL — ABNORMAL HIGH (ref 70–99)
Potassium: 3.4 mmol/L — ABNORMAL LOW (ref 3.5–5.1)
Sodium: 135 mmol/L (ref 135–145)

## 2020-09-21 LAB — CBC
HCT: 29.1 % — ABNORMAL LOW (ref 36.0–46.0)
Hemoglobin: 9.8 g/dL — ABNORMAL LOW (ref 12.0–15.0)
MCH: 27.5 pg (ref 26.0–34.0)
MCHC: 33.7 g/dL (ref 30.0–36.0)
MCV: 81.5 fL (ref 80.0–100.0)
Platelets: 352 10*3/uL (ref 150–400)
RBC: 3.57 MIL/uL — ABNORMAL LOW (ref 3.87–5.11)
RDW: 14.2 % (ref 11.5–15.5)
WBC: 8.7 10*3/uL (ref 4.0–10.5)
nRBC: 0 % (ref 0.0–0.2)

## 2020-09-21 LAB — GLUCOSE, CAPILLARY
Glucose-Capillary: 106 mg/dL — ABNORMAL HIGH (ref 70–99)
Glucose-Capillary: 119 mg/dL — ABNORMAL HIGH (ref 70–99)
Glucose-Capillary: 124 mg/dL — ABNORMAL HIGH (ref 70–99)
Glucose-Capillary: 130 mg/dL — ABNORMAL HIGH (ref 70–99)
Glucose-Capillary: 132 mg/dL — ABNORMAL HIGH (ref 70–99)
Glucose-Capillary: 136 mg/dL — ABNORMAL HIGH (ref 70–99)

## 2020-09-21 LAB — MAGNESIUM: Magnesium: 1.6 mg/dL — ABNORMAL LOW (ref 1.7–2.4)

## 2020-09-21 MED ORDER — POTASSIUM CHLORIDE 10 MEQ/100ML IV SOLN
10.0000 meq | INTRAVENOUS | Status: AC
  Administered 2020-09-21 (×2): 10 meq via INTRAVENOUS
  Filled 2020-09-21 (×2): qty 100

## 2020-09-21 MED ORDER — IOHEXOL 300 MG/ML  SOLN
150.0000 mL | Freq: Once | INTRAMUSCULAR | Status: AC | PRN
  Administered 2020-09-21: 120 mL

## 2020-09-21 NOTE — Progress Notes (Signed)
Physical Therapy Treatment Patient Details Name: Mary Le MRN: [id number] DOB: 1946/03/18 Today's Date: 09/21/2020    History of Present Illness 75 year old woman PMHx:CVA with LUE/LE weakness, back pain, HTN who presents with acute worsening of abdominal pain.  She has been having abdominal pain and vague nausea for 3 or 4 days, now acute onset of right-sided abdominal pain radiating from the right lower quadrant to the right upper quadrant into her shoulder and back. Admitted 09/16/20 for ex lap and repair of preforated pyloric channel ulcer. Now with NGT to suction and JP drain to R side.    PT Comments    Pt received in supine, anxious and fearful of mobility due to pain but agreeable to therapy session after premedication and with fair participation and tolerance for mobility. She is limited due to decreased activity tolerance/pain this date, able to sit EOB ~3 minutes but still needs +2 maxA to totalA for rolling L/R and +2 totalA for bed mobility. Pt unable to attempt standing transfers due to pain/fatigue after sitting and with very poor seated balance and heavy L sided lean. Pt continues to benefit from PT services to progress toward functional mobility goals. Discharge/DME recommendations updated after discussion with supervising PT per pt limited progress, will continue to assess disposition during acute stay.  Follow Up Recommendations  Supervision/Assistance - 24 hour;SNF (pending progress, pt wants to work toward home)     Equipment Recommendations  Other (comment) (possible hemi-walker, continue to assess; if home, may need hospital bed, hoyer lift)    Recommendations for Other Services       Precautions / Restrictions Precautions Precautions: Fall Precaution Comments: NG tube, JP drain on R side, LUE>LLE weakness Restrictions Weight Bearing Restrictions: No Other Position/Activity Restrictions: LUE non functional arm - contracted.  Has a splint at home?    Mobility   Bed Mobility Overal bed mobility: Needs Assistance Bed Mobility: Rolling;Sidelying to Sit;Sit to Sidelying Rolling: Max assist;+2 for physical assistance;Total assist (totalA to R side, +67maxA to totalA to L side) Sidelying to sit: Total assist;HOB elevated;+2 for physical assistance     Sit to sidelying: Total assist;+2 for physical assistance General bed mobility comments: multimodal cues for sequencing and increased time to perform; pt requires rest breaks and maxA +2 to roll to L side and total A to roll to R for transfer pad repostioning and posterior supine scoot; max assist for rolling to L and moving BLEs to EOB- pt able to initiate movement, but unable to perform task without assist.    Transfers                 General transfer comment: unable to attempt due to pain and fatigue with EOB sitting  Ambulation/Gait                 Stairs             Wheelchair Mobility    Modified Rankin (Stroke Patients Only)       Balance Overall balance assessment: Needs assistance Sitting-balance support: Feet unsupported;Single extremity supported;No upper extremity supported Sitting balance-Leahy Scale: Zero Sitting balance - Comments: pt tolerated static sitting EOB ~3 minutes with heavy L lean and needed maxA trunk support to remain upright, HR to 120's bpm       Standing balance comment: pt unable to attempt standing this date, c/o signficant fatigue after RLE seated exercises  Cognition Arousal/Alertness: Awake/alert Behavior During Therapy: Flat affect;Anxious Overall Cognitive Status: No family/caregiver present to determine baseline cognitive functioning                                 General Comments: pt significantly anxious and pain limited, during transfer to EOB verbalized fear of falls and "I don't feel safe" while rolling/prior to sitting up; pt needs max multimodal cues for task sequencing  and gentle encouragement throughout; pt thankful to staff after mobility      Exercises General Exercises - Lower Extremity Ankle Circles/Pumps: AROM;AAROM;Both;15 reps;Supine;Seated (AA on LLE, AROM on RLE) Quad Sets: AROM;Right;5 reps;Supine Gluteal Sets:  (cues to perform on her own in bed) Long Arc Quad: AROM;Right;10 reps;Seated Heel Slides: AROM;AAROM;Both;Supine;5 reps (AA on L, AROM on RLE) Hip ABduction/ADduction: AROM;AAROM;Right;Supine;5 reps (AA on L x5 reps, AROM on R x10 reps) Hip Flexion/Marching: AROM;Both;10 reps;Seated    General Comments General comments (skin integrity, edema, etc.): pt incontinent of stool during mobility tasks and RN notified pt will need new foam mepilex pad and barrier cream to sacrum as it was removed during hygiene care/cleanup; heels floated at end of session      Pertinent Vitals/Pain Pain Assessment: 0-10 Pain Score: 7  Pain Location: surgical incision at abdomen and throat/NG tube related Pain Descriptors / Indicators: Operative site guarding;Grimacing;Aching;Tender;Moaning;Discomfort Pain Intervention(s): Monitored during session;Repositioned;Premedicated before session;Utilized relaxation techniques    Home Living                      Prior Function            PT Goals (current goals can now be found in the care plan section) Acute Rehab PT Goals Patient Stated Goal: have less pain and do more PT Goal Formulation: With patient Time For Goal Achievement: 10/02/20 Potential to Achieve Goals: Fair Progress towards PT goals: Progressing toward goals (slow progress, pain limited)    Frequency    Min 3X/week      PT Plan Discharge plan needs to be updated;Equipment recommendations need to be updated    Co-evaluation              AM-PAC PT "6 Clicks" Mobility   Outcome Measure  Help needed turning from your back to your side while in a flat bed without using bedrails?: A Lot Help needed moving from lying on  your back to sitting on the side of a flat bed without using bedrails?: Total Help needed moving to and from a bed to a chair (including a wheelchair)?: Total Help needed standing up from a chair using your arms (e.g., wheelchair or bedside chair)?: Total Help needed to walk in hospital room?: Total Help needed climbing 3-5 steps with a railing? : Total 6 Click Score: 7    End of Session Equipment Utilized During Treatment:  (could benefit from multi podus boot for LLE in bed, RN notified) Activity Tolerance: Patient limited by pain;Treatment limited secondary to medical complications (Comment);Other (comment) (HR elevated with seated posture) Patient left: in bed;with call bell/phone within reach;with bed alarm set Nurse Communication: Mobility status;Other (comment) (pt needs multi-podus boot for LLE, RN notified 3/2 and 3/3) PT Visit Diagnosis: Muscle weakness (generalized) (M62.81);Difficulty in walking, not elsewhere classified (R26.2);Pain;Hemiplegia and hemiparesis;Other abnormalities of gait and mobility (R26.89) Hemiplegia - Right/Left: Left Hemiplegia - dominant/non-dominant: Non-dominant Hemiplegia - caused by: Cerebral infarction Pain - Right/Left: Right Pain - part  of body:  (abd incisional pain and throat/NGT)     Time: 1740-9927 PT Time Calculation (min) (ACUTE ONLY): 29 min  Charges:  $Therapeutic Exercise: 8-22 mins $Therapeutic Activity: 8-22 mins                     Besse Miron P., PTA Acute Rehabilitation Services Pager: [phone] Office: [place] 09/21/2020, 11:47 AM

## 2020-09-21 NOTE — Progress Notes (Signed)
Removed NG tube and pt without any issues at this time.  Pt aware that she will be on clear liquid diet.  Pt concerned with how long it would take for her throat to heal.  I explained that everyone is different but to avoid acidic and hard foods to help with rapid healing. Pt resting with call bell within reach.  Will continue to monitor. Payton Emerald, RN

## 2020-09-21 NOTE — Progress Notes (Signed)
Progress Note  4 Days Post-Op  Subjective: Pain well-controlled. JP serosanguinous. No acute changes. Labs unremarkable.  Objective: Vital signs in last 24 hours: Temp:  [98 F (36.7 C)-98.7 F (37.1 C)] 98.4 F (36.9 C) (03/03 1350) Pulse Rate:  [92-117] 116 (03/03 1332) Resp:  [16-20] 16 (03/03 1350) BP: (136-170)/(60-96) 141/64 (03/03 1350) SpO2:  [94 %-98 %] 96 % (03/03 1332) Last BM Date: 09/14/20  Intake/Output from previous day: 03/02 0701 - 03/03 0700 In: 2730.3 [I.V.:2730.3] Out: 19 [Urine:650; Drains:155] Intake/Output this shift: Total I/O In: -  Out: 300 [Urine:300]  PE: General: pleasant, WD, obese female who is laying in bed in NAD Heart: regular, rate, and rhythm.   Lungs: CTAB, no wheezes, rhonchi, or rales noted.  Respiratory effort nonlabored Abd: soft, nondistended, nontender to palpation. Midline incision c/d/i with staples in place. JP with serosanguinous drainage. Skin: warm and dry with no masses, lesions, or rashes Psych: A&Ox3 with an appropriate affect.   Lab Results:  Recent Labs    09/20/20 0500 09/21/20 0309  WBC 11.2* 8.7  HGB 10.7* 9.8*  HCT 32.3* 29.1*  PLT 408* 352   BMET Recent Labs    09/20/20 0500 09/21/20 0309  NA 138 135  K 3.6 3.4*  CL 111 110  CO2 20* 19*  GLUCOSE 159* 131*  BUN 8 <5*  CREATININE 0.63 0.49  CALCIUM 7.8* 7.2*   PT/INR No results for input(s): LABPROT, INR in the last 72 hours. CMP     Component Value Date/Time   NA 135 09/21/2020 0309   NA 145 (H) 08/19/2017 1411   K 3.4 (L) 09/21/2020 0309   CL 110 09/21/2020 0309   CO2 19 (L) 09/21/2020 0309   GLUCOSE 131 (H) 09/21/2020 0309   BUN <5 (L) 09/21/2020 0309   BUN 13 08/19/2017 1411   CREATININE 0.49 09/21/2020 0309   CREATININE 0.76 01/15/2015 1232   CALCIUM 7.2 (L) 09/21/2020 0309   PROT 7.1 09/16/2020 2154   PROT 7.4 09/02/2018 1513   ALBUMIN 3.2 (L) 09/16/2020 2154   ALBUMIN 4.1 09/02/2018 1513   AST 19 09/16/2020 2154   ALT  15 09/16/2020 2154   ALKPHOS 52 09/16/2020 2154   BILITOT 0.6 09/16/2020 2154   BILITOT 0.2 09/02/2018 1513   GFRNONAA >60 09/21/2020 0309   GFRNONAA 78 01/03/2014 1614   GFRAA >60 07/13/2018 1416   GFRAA >89 01/03/2014 1614   Lipase     Component Value Date/Time   LIPASE 122 (H) 09/16/2020 2154       Studies/Results: DG UGI W SINGLE CM (SOL OR THIN BA)  Result Date: 09/21/2020 CLINICAL DATA:  Status post surgical repair of ulcer. EXAM: WATER SOLUBLE UPPER GI SERIES TECHNIQUE: Single-column upper GI series was performed using water soluble contrast. CONTRAST:  120 mL Omnipaque 300 through nasogastric tube. COMPARISON:  September 16, 2020. FLUOROSCOPY TIME:  Radiation Exposure Index (if provided by the fluoroscopic device): 142.4 mGy. FINDINGS: Nasogastric tube tip is in the proximal stomach, with side hole still in the distal esophagus, so filling of the distal esophagus is noted. Contrast flowed easily from the stomach into the duodenum. There is noted contrast filling of an outpouching at the gastroduodenal junction which corresponds to abnormality seen on prior CT scan. This abnormality has a broad neck and may simply represent diverticulum, although large ulceration cannot be excluded. There does not appear to be extravasation of contrast beyond the confines of this abnormality or lumen. The remaining portion of the stomach  and duodenum are unremarkable. IMPRESSION: Contrast filling of outpouching is noted at the gastroduodenal junction which corresponds to abnormality seen on prior CT scan. It has a broad neck and may simply represent diverticulum, although large ulceration cannot be excluded. There does not appear to be extravasation of contrast to suggest leak on this exam. Electronically Signed   By: Marijo Conception M.D.   On: 09/21/2020 08:53      Assessment/Plan HTN Hx of CVA Depression/Anxiety Milk hypokalemia - K repleted LUE swelling - contracture s/p previous CVA, [place]  negative for DVT  Perforated gastric ulcer S/P exploratory laparotomy with Phillip Heal patch repair 2/27 Dr. Kae Heller - POD#5 - Upper GI reviewed - there is a small outpouching that appears to be in the proximal duodenum vs pyloric channel, possible diverticulum vs ulceration. There is no free extravasation into the peritoneum and patient otherwise appears clinically well. Will remove NG and trial clear liquids. - Keep JP drain - Continue to mobilize as able - continue BID PPI  VTE: lovenox ID: [id number] 2/27>3/2  LOS: 4 days   Michaelle Birks, MD [facility] Surgery General, Hepatobiliary and Pancreatic Surgery 09/21/20 2:24 PM

## 2020-09-21 NOTE — Progress Notes (Signed)
Patient has had incontinence of stool today.  She had BM on night shift and placed on bedpan after incontinence episode prior to UGI. Pt stated she could not tell that shew was passing stool. Pt resting with call bell within reach.  Will continue to monitor.

## 2020-09-22 LAB — BASIC METABOLIC PANEL
Anion gap: 8 (ref 5–15)
BUN: <5 mg/dL — ABNORMAL LOW (ref 8–23)
CO2: 18 mmol/L — ABNORMAL LOW (ref 22–32)
Calcium: 7.9 mg/dL — ABNORMAL LOW (ref 8.9–10.3)
Chloride: 109 mmol/L (ref 98–111)
Creatinine, Ser: 0.5 mg/dL (ref 0.44–1.00)
GFR, Estimated: >60 mL/min (ref >60)
Glucose, Bld: 113 mg/dL — ABNORMAL HIGH (ref 70–99)
Potassium: 4.1 mmol/L (ref 3.5–5.1)
Sodium: 135 mmol/L (ref 135–145)

## 2020-09-22 LAB — CBC
HCT: 29.4 % — ABNORMAL LOW (ref 36.0–46.0)
Hemoglobin: 9.9 g/dL — ABNORMAL LOW (ref 12.0–15.0)
MCH: 27.1 pg (ref 26.0–34.0)
MCHC: 33.7 g/dL (ref 30.0–36.0)
MCV: 80.5 fL (ref 80.0–100.0)
Platelets: 340 10*3/uL (ref 150–400)
RBC: 3.65 MIL/uL — ABNORMAL LOW (ref 3.87–5.11)
RDW: 14.1 % (ref 11.5–15.5)
WBC: 10.2 10*3/uL (ref 4.0–10.5)
nRBC: 0 % (ref 0.0–0.2)

## 2020-09-22 LAB — GLUCOSE, CAPILLARY
Glucose-Capillary: 101 mg/dL — ABNORMAL HIGH (ref 70–99)
Glucose-Capillary: 115 mg/dL — ABNORMAL HIGH (ref 70–99)
Glucose-Capillary: 171 mg/dL — ABNORMAL HIGH (ref 70–99)

## 2020-09-22 MED ORDER — MELATONIN 5 MG PO TABS
5.0000 mg | ORAL_TABLET | Freq: Every day | ORAL | Status: DC
  Administered 2020-09-22 – 2020-09-28 (×7): 5 mg via ORAL
  Filled 2020-09-22 (×7): qty 1

## 2020-09-22 MED ORDER — MORPHINE SULFATE (PF) 2 MG/ML IV SOLN
2.0000 mg | INTRAVENOUS | Status: DC | PRN
  Administered 2020-09-22 (×2): 2 mg via INTRAVENOUS
  Filled 2020-09-22 (×2): qty 1

## 2020-09-22 MED ORDER — LACTULOSE 10 GM/15ML PO SOLN: 10.0000 g | Freq: Every day | ORAL | Status: DC | PRN

## 2020-09-22 MED ORDER — POLYETHYLENE GLYCOL 3350 17 G PO PACK
17.0000 g | PACK | Freq: Every day | ORAL | Status: DC
  Administered 2020-09-23 – 2020-09-29 (×7): 17 g via ORAL
  Filled 2020-09-22 (×7): qty 1

## 2020-09-22 MED ORDER — ENSURE SURGERY PO LIQD
237.0000 mL | Freq: Two times a day (BID) | ORAL | Status: DC
  Administered 2020-09-22 – 2020-09-26 (×6): 237 mL via ORAL
  Filled 2020-09-22 (×16): qty 237

## 2020-09-22 MED ORDER — OXYCODONE HCL 5 MG PO TABS
5.0000 mg | ORAL_TABLET | ORAL | Status: DC | PRN
  Administered 2020-09-22 – 2020-09-25 (×12): 10 mg via ORAL
  Administered 2020-09-25 – 2020-09-26 (×4): 5 mg via ORAL
  Administered 2020-09-27: 10 mg via ORAL
  Administered 2020-09-27: 5 mg via ORAL
  Administered 2020-09-28 – 2020-09-29 (×5): 10 mg via ORAL
  Filled 2020-09-22 (×4): qty 2
  Filled 2020-09-22: qty 1
  Filled 2020-09-22 (×3): qty 2
  Filled 2020-09-22 (×2): qty 1
  Filled 2020-09-22 (×2): qty 2
  Filled 2020-09-22 (×2): qty 1
  Filled 2020-09-22 (×7): qty 2
  Filled 2020-09-22: qty 1
  Filled 2020-09-22 (×3): qty 2

## 2020-09-22 MED ORDER — DOCUSATE SODIUM 100 MG PO CAPS
100.0000 mg | ORAL_CAPSULE | Freq: Two times a day (BID) | ORAL | Status: DC
  Administered 2020-09-22 – 2020-09-29 (×14): 100 mg via ORAL
  Filled 2020-09-22 (×14): qty 1

## 2020-09-22 MED ORDER — ACETAMINOPHEN 325 MG PO TABS
650.0000 mg | ORAL_TABLET | Freq: Four times a day (QID) | ORAL | Status: DC
  Administered 2020-09-22 – 2020-09-29 (×28): 650 mg via ORAL
  Filled 2020-09-22 (×28): qty 2

## 2020-09-22 MED ORDER — METHOCARBAMOL 500 MG PO TABS
500.0000 mg | ORAL_TABLET | Freq: Three times a day (TID) | ORAL | Status: DC
  Administered 2020-09-22 (×3): 500 mg via ORAL
  Filled 2020-09-22 (×3): qty 1

## 2020-09-22 NOTE — Discharge Instructions (Signed)
[facility], [place] [phone]  OPEN ABDOMINAL SURGERY: POST OP INSTRUCTIONS  Always review your discharge instruction sheet given to you by the facility where your surgery was performed.  IF YOU HAVE DISABILITY OR FAMILY LEAVE FORMS, YOU MUST BRING THEM TO THE OFFICE FOR PROCESSING.  PLEASE DO NOT GIVE THEM TO YOUR DOCTOR.  1. A prescription for pain medication may be given to you upon discharge.  Take your pain medication as prescribed, if needed.  If narcotic pain medicine is not needed, then you may take acetaminophen (Tylenol) or ibuprofen (Advil) as needed. 2. Take your usually prescribed medications unless otherwise directed. 3. If you need a refill on your pain medication, please contact your pharmacy. They will contact our office to request authorization.  Prescriptions will not be filled after 5pm or on week-ends. 4. You should follow a light diet the first few days after arrival home, such as soup and crackers, pudding, etc.unless your doctor has advised otherwise. A high-fiber, low fat diet can be resumed as tolerated.   Be sure to include lots of fluids daily. Most patients will experience some swelling and bruising on the chest and neck area.  Ice packs will help.  Swelling and bruising can take several days to resolve 5. Most patients will experience some swelling and bruising in the area of the incision. Ice pack will help. Swelling and bruising can take several days to resolve..  6. It is common to experience some constipation if taking pain medication after surgery.  Increasing fluid intake and taking a stool softener will usually help or prevent this problem from occurring.  A mild laxative (Milk of Magnesia or Miralax) should be taken according to package directions if there are no bowel movements after 48 hours. 7.  You may have steri-strips (small skin tapes) in place directly over the incision.  These strips should be left on the skin for 7-10 days.  If your  surgeon used skin glue on the incision, you may shower in 24 hours.  The glue will flake off over the next 2-3 weeks.  Any sutures or staples will be removed at the office during your follow-up visit. You may find that a light gauze bandage over your incision may keep your staples from being rubbed or pulled. You may shower and replace the bandage daily. 8. ACTIVITIES:  You may resume regular (light) daily activities beginning the next day--such as daily self-care, walking, climbing stairs--gradually increasing activities as tolerated.  You may have sexual intercourse when it is comfortable.  Refrain from any heavy lifting or straining until approved by your doctor. a. You may drive when you no longer are taking prescription pain medication, you can comfortably wear a seatbelt, and you can safely maneuver your car and apply brakes  9. You should see your doctor in the office for a follow-up appointment approximately two weeks after your surgery.  Make sure that you call for this appointment within a day or two after you arrive home to insure a convenient appointment time.   WHEN TO CALL YOUR DOCTOR: 1. Fever over 101.0 2. Inability to urinate 3. Nausea and/or vomiting 4. Extreme swelling or bruising 5. Continued bleeding from incision. 6. Increased pain, redness, or drainage from the incision. 7. Difficulty swallowing or breathing 8. Muscle cramping or spasms. 9. Numbness or tingling in hands or feet or around lips.  The clinic staff is available to answer your questions during regular business hours.  Please don't hesitate to call and ask to speak to one of the nurses if you have concerns.  For further questions, please visit [email or web address]     Peptic Ulcer  A peptic ulcer is a painful sore in the lining of your stomach or the first part of your small intestine. What are the causes? Common causes of this condition include:  An infection.  Using certain pain medicines  too often or too much. What increases the risk? You are more likely to get this condition if you:  Smoke.  Have a family history of ulcer disease.  Drink alcohol.  Have been hospitalized in an intensive care unit (ICU). What are the signs or symptoms? Symptoms include:  Burning pain in the area between the chest and the belly button. The pain may: ? Not go away (be persistent). ? Be worse when your stomach is empty. ? Be worse at night.  Heartburn.  Feeling sick to your stomach (nauseous) and throwing up (vomiting).  Bloating. If the ulcer results in bleeding, it can cause you to:  Have poop (stool) that is black and looks like tar.  Throw up bright red blood.  Throw up material that looks like coffee grounds. How is this treated? Treatment for this condition may include:  Stopping things that can cause the ulcer, such as: ? Smoking. ? Using pain medicines.  Medicines to reduce stomach acid.  Antibiotic medicines if the ulcer is caused by an infection.  A procedure that is done using a small, flexible tube that has a camera at the end (upper endoscopy). This may be done if you have a bleeding ulcer.  Surgery. This may be needed if: ? You have a lot of bleeding. ? The ulcer caused a hole somewhere in the digestive system. Follow these instructions at home:  Do not drink alcohol if your doctor tells you not to drink.  Limit how much caffeine you take in.  Do not use any products that contain nicotine or tobacco, such as cigarettes, e-cigarettes, and chewing tobacco. If you need help quitting, ask your doctor.  Take over-the-counter and prescription medicines only as told by your doctor. ? Do not stop or change your medicines unless you talk with your doctor about it first. ? Do not take aspirin, ibuprofen, or other NSAIDs unless your doctor told you to do so.  Keep all follow-up visits as told by your doctor. This is important. Contact a doctor if:  You do  not get better in 7 days after you start treatment.  You keep having an upset stomach (indigestion) or heartburn. Get help right away if:  You have sudden, sharp pain in your belly (abdomen).  You have belly pain that does not go away.  You have bloody poop (stool) or black, tarry poop.  You throw up blood. It may look like coffee grounds.  You feel light-headed or feel like you may pass out (faint).  You get weak.  You get sweaty or feel sticky and cold to the touch (clammy). Summary  Symptoms of a peptic ulcer include burning pain in the area between the chest and the belly button.  Take medicines only as told by your doctor.  Limit how much alcohol and caffeine you have.  Keep all follow-up visits as told by your doctor. This information is not intended to replace advice given to you by your health care provider. Make sure you discuss any questions you have with your health care provider. Document Revised:  01/13/2018 Document Reviewed: 01/13/2018 Elsevier Patient Education  [employer].

## 2020-09-22 NOTE — Progress Notes (Signed)
Progress Note  5 Days Post-Op  Subjective: Patient reports stable abdominal pain. Denies nausea and did not have any worsened pain with CLD yesterday. She is having bowel function. Discussed transition to PO pain control with patient and her daughter on the phone. Discussed trying to get up out of bed and mobilize more.   Objective: Vital signs in last 24 hours: Temp:  [98 F (36.7 C)-99.6 F (37.6 C)] 99.3 F (37.4 C) (03/04 0806) Pulse Rate:  [100-117] 102 (03/04 0348) Resp:  [16-20] 17 (03/04 0806) BP: (132-160)/(59-92) 155/68 (03/04 0806) SpO2:  [95 %-100 %] 97 % (03/04 0806) Last BM Date: 09/21/20  Intake/Output from previous day: 03/03 0701 - 03/04 0700 In: 2339.6 [I.V.:2139.6; IV Piggyback:200] Out: 2075 [Urine:1940; Drains:135] Intake/Output this shift: Total I/O In: -  Out: 400 [Urine:400]  PE: General: pleasant, WD,obesefemale who is laying in bed in NAD Heart: regular, rate, and rhythm.  Lungs: CTAB, no wheezes, rhonchi, or rales noted. Respiratory effort nonlabored Abd: soft,nondistended, nontender to palpation. Midline incision c/d/i with staples in place. JP with serosanguinous drainage. Skin: warm and dry with no masses, lesions, or rashes Psych: A&Ox3 with an appropriate affect.   Lab Results:  Recent Labs    09/21/20 0309 09/22/20 0716  WBC 8.7 10.2  HGB 9.8* 9.9*  HCT 29.1* 29.4*  PLT 352 340   BMET Recent Labs    09/21/20 0309 09/22/20 0555  NA 135 135  K 3.4* 4.1  CL 110 109  CO2 19* 18*  GLUCOSE 131* 113*  BUN <5* <5*  CREATININE 0.49 0.50  CALCIUM 7.2* 7.9*   PT/INR No results for input(s): LABPROT, INR in the last 72 hours. CMP     Component Value Date/Time   NA 135 09/22/2020 0555   NA 145 (H) 08/19/2017 1411   K 4.1 09/22/2020 0555   CL 109 09/22/2020 0555   CO2 18 (L) 09/22/2020 0555   GLUCOSE 113 (H) 09/22/2020 0555   BUN <5 (L) 09/22/2020 0555   BUN 13 08/19/2017 1411   CREATININE 0.50 09/22/2020 0555    CREATININE 0.76 01/15/2015 1232   CALCIUM 7.9 (L) 09/22/2020 0555   PROT 7.1 09/16/2020 2154   PROT 7.4 09/02/2018 1513   ALBUMIN 3.2 (L) 09/16/2020 2154   ALBUMIN 4.1 09/02/2018 1513   AST 19 09/16/2020 2154   ALT 15 09/16/2020 2154   ALKPHOS 52 09/16/2020 2154   BILITOT 0.6 09/16/2020 2154   BILITOT 0.2 09/02/2018 1513   GFRNONAA >60 09/22/2020 0555   GFRNONAA 78 01/03/2014 1614   GFRAA >60 07/13/2018 1416   GFRAA >89 01/03/2014 1614   Lipase     Component Value Date/Time   LIPASE 122 (H) 09/16/2020 2154       Studies/Results: DG UGI W SINGLE CM (SOL OR THIN BA)  Result Date: 09/21/2020 CLINICAL DATA:  Status post surgical repair of ulcer. EXAM: WATER SOLUBLE UPPER GI SERIES TECHNIQUE: Single-column upper GI series was performed using water soluble contrast. CONTRAST:  120 mL Omnipaque 300 through nasogastric tube. COMPARISON:  September 16, 2020. FLUOROSCOPY TIME:  Radiation Exposure Index (if provided by the fluoroscopic device): 142.4 mGy. FINDINGS: Nasogastric tube tip is in the proximal stomach, with side hole still in the distal esophagus, so filling of the distal esophagus is noted. Contrast flowed easily from the stomach into the duodenum. There is noted contrast filling of an outpouching at the gastroduodenal junction which corresponds to abnormality seen on prior CT scan. This abnormality has a broad  neck and may simply represent diverticulum, although large ulceration cannot be excluded. There does not appear to be extravasation of contrast beyond the confines of this abnormality or lumen. The remaining portion of the stomach and duodenum are unremarkable. IMPRESSION: Contrast filling of outpouching is noted at the gastroduodenal junction which corresponds to abnormality seen on prior CT scan. It has a broad neck and may simply represent diverticulum, although large ulceration cannot be excluded. There does not appear to be extravasation of contrast to suggest leak on this  exam. Electronically Signed   By: Marijo Conception M.D.   On: 09/21/2020 08:53    Anti-infectives: Anti-infectives (From admission, onward)   Start     Dose/Rate Route Frequency Ordered Stop   09/17/20 0600  piperacillin-tazobactam (ZOSYN) IVPB 3.375 g  Status:  Discontinued       Note to Pharmacy: Pharmacy to adjust dose as indicated   3.375 g 12.5 mL/hr over 240 Minutes Intravenous Every 8 hours 09/17/20 0506 09/20/20 0857   09/17/20 0015  piperacillin-tazobactam (ZOSYN) IVPB 3.375 g        3.375 g 100 mL/hr over 30 Minutes Intravenous  Once 09/17/20 0003 09/17/20 0114       Assessment/Plan HTN Hx of CVA Depression/Anxiety - start melatonin QHS for sleep, patient takes this at home Milk hypokalemia - K repleted LUE swelling - contracture s/p previous CVA, [place] negative for DVT  Perforated gastric ulcer S/P exploratory laparotomy with Phillip Heal patch repair 2/27 Dr. Kae Heller - POD#6 - Upper GI reviewed - there is a small outpouching that appears to be in the proximal duodenum vs pyloric channel, possible diverticulum vs ulceration. There is no free extravasation into the peritoneum and patient otherwise appears clinically well.  - tolerated CLD - advance to FLD - Keep JP drain - Continue to mobilize as able - continue BID PPI - transition to PO pain control  - may be ready for discharge Sunday or Monday if continuing to progress well   FEN: FLD, SLIV; bowel regimen VTE: lovenox ID: Zosyn 2/27>3/2   LOS: 5 days    Norm Parcel , [facility] 09/22/2020, 9:19 AM Please see Amion for pager number during day hours 7:00am-4:30pm

## 2020-09-22 NOTE — Progress Notes (Signed)
Pt transferred to [place].  Report given to [facility].  Pt taken off telemetry and CCMD notified.  Pt left with all of their personal belongings.

## 2020-09-22 NOTE — Care Management Important Message (Signed)
Important Message  Patient Details  Name: Mary Le MRN: [id number] Date of Birth: 04/28/1946   Medicare Important Message Given:  Yes     [name] 09/22/2020, 11:07 AM

## 2020-09-23 MED ORDER — MORPHINE SULFATE (PF) 2 MG/ML IV SOLN
2.0000 mg | INTRAVENOUS | Status: DC | PRN
  Administered 2020-09-23 – 2020-09-29 (×7): 2 mg via INTRAVENOUS
  Filled 2020-09-23 (×7): qty 1

## 2020-09-23 MED ORDER — METOPROLOL TARTRATE 50 MG PO TABS
50.0000 mg | ORAL_TABLET | Freq: Two times a day (BID) | ORAL | Status: DC
  Administered 2020-09-23 – 2020-09-29 (×13): 50 mg via ORAL
  Filled 2020-09-23 (×13): qty 1

## 2020-09-23 MED ORDER — METHOCARBAMOL 750 MG PO TABS
750.0000 mg | ORAL_TABLET | Freq: Three times a day (TID) | ORAL | Status: DC
  Administered 2020-09-23 – 2020-09-29 (×20): 750 mg via ORAL
  Filled 2020-09-23 (×20): qty 1

## 2020-09-23 NOTE — Progress Notes (Signed)
[facility] Progress Note  6 Days Post-Op  Subjective: CC-  Overall abdominal pain has improved, but she does still report fairly regular pain. She is using morphine more than oxycodone. Denies n/v. BM yesterday. Tolerating full liquids.  Objective: Vital signs in last 24 hours: Temp:  [98 F (36.7 C)-98.6 F (37 C)] 98.2 F (36.8 C) (03/05 0455) Pulse Rate:  [93-99] 96 (03/05 0455) Resp:  [16-18] 18 (03/05 0455) BP: (124-160)/(44-81) 124/72 (03/05 0455) SpO2:  [96 %-99 %] 97 % (03/05 0455) Last BM Date: 09/22/20  Intake/Output from previous day: 03/04 0701 - 03/05 0700 In: -  Out: 1525 [Urine:1350; Drains:175] Intake/Output this shift: No intake/output data recorded.  PE: General: Alert, NAD Lungs: Respiratory effort nonlabored Abd: soft,nondistended, nontender to palpation. Midline incision c/d/i with staples in place. JP with serosanguinous drainage. Skin: warm and dry with no masses, lesions, or rashes  Lab Results:  Recent Labs    09/21/20 0309 09/22/20 0716  WBC 8.7 10.2  HGB 9.8* 9.9*  HCT 29.1* 29.4*  PLT 352 340   BMET Recent Labs    09/21/20 0309 09/22/20 0555  NA 135 135  K 3.4* 4.1  CL 110 109  CO2 19* 18*  GLUCOSE 131* 113*  BUN <5* <5*  CREATININE 0.49 0.50  CALCIUM 7.2* 7.9*   PT/INR No results for input(s): LABPROT, INR in the last 72 hours. CMP     Component Value Date/Time   NA 135 09/22/2020 0555   NA 145 (H) 08/19/2017 1411   K 4.1 09/22/2020 0555   CL 109 09/22/2020 0555   CO2 18 (L) 09/22/2020 0555   GLUCOSE 113 (H) 09/22/2020 0555   BUN <5 (L) 09/22/2020 0555   BUN 13 08/19/2017 1411   CREATININE 0.50 09/22/2020 0555   CREATININE 0.76 01/15/2015 1232   CALCIUM 7.9 (L) 09/22/2020 0555   PROT 7.1 09/16/2020 2154   PROT 7.4 09/02/2018 1513   ALBUMIN 3.2 (L) 09/16/2020 2154   ALBUMIN 4.1 09/02/2018 1513   AST 19 09/16/2020 2154   ALT 15 09/16/2020 2154   ALKPHOS 52 09/16/2020 2154   BILITOT 0.6 09/16/2020  2154   BILITOT 0.2 09/02/2018 1513   GFRNONAA >60 09/22/2020 0555   GFRNONAA 78 01/03/2014 1614   GFRAA >60 07/13/2018 1416   GFRAA >89 01/03/2014 1614   Lipase     Component Value Date/Time   LIPASE 122 (H) 09/16/2020 2154       Studies/Results: DG UGI W SINGLE CM (SOL OR THIN BA)  Result Date: 09/21/2020 CLINICAL DATA:  Status post surgical repair of ulcer. EXAM: WATER SOLUBLE UPPER GI SERIES TECHNIQUE: Single-column upper GI series was performed using water soluble contrast. CONTRAST:  120 mL Omnipaque 300 through nasogastric tube. COMPARISON:  September 16, 2020. FLUOROSCOPY TIME:  Radiation Exposure Index (if provided by the fluoroscopic device): 142.4 mGy. FINDINGS: Nasogastric tube tip is in the proximal stomach, with side hole still in the distal esophagus, so filling of the distal esophagus is noted. Contrast flowed easily from the stomach into the duodenum. There is noted contrast filling of an outpouching at the gastroduodenal junction which corresponds to abnormality seen on prior CT scan. This abnormality has a broad neck and may simply represent diverticulum, although large ulceration cannot be excluded. There does not appear to be extravasation of contrast beyond the confines of this abnormality or lumen. The remaining portion of the stomach and duodenum are unremarkable. IMPRESSION: Contrast filling of outpouching is noted at the gastroduodenal junction  which corresponds to abnormality seen on prior CT scan. It has a broad neck and may simply represent diverticulum, although large ulceration cannot be excluded. There does not appear to be extravasation of contrast to suggest leak on this exam. Electronically Signed   By: Marijo Conception M.D.   On: 09/21/2020 08:53    Anti-infectives: Anti-infectives (From admission, onward)   Start     Dose/Rate Route Frequency Ordered Stop   09/17/20 0600  piperacillin-tazobactam (ZOSYN) IVPB 3.375 g  Status:  Discontinued       Note to  Pharmacy: Pharmacy to adjust dose as indicated   3.375 g 12.5 mL/hr over 240 Minutes Intravenous Every 8 hours 09/17/20 0506 09/20/20 0857   09/17/20 0015  piperacillin-tazobactam (ZOSYN) IVPB 3.375 g        3.375 g 100 mL/hr over 30 Minutes Intravenous  Once 09/17/20 0003 09/17/20 0114       Assessment/Plan HTN Hx of CVA Depression/Anxiety - melatonin QHS LUE swelling - contracture s/p previous CVA, [place] negative for DVT  Perforated gastric ulcer S/P exploratory laparotomy with Phillip Heal patch repair 2/27 Dr. Kae Heller - POD#7 - Upper GI reviewed - there is a small outpouching that appears to be in the proximal duodenum vs pyloric channel, possible diverticulum vs ulceration. There is no free extravasation into the peritoneum and patient otherwise appears clinically well.  - tolerated FLD - advance to soft diet - Keep JP drain today, if output remains SS can likely d/c this tomorrow - Continue to mobilize as able - continue BID PPI - Discussed with patient and nurse using oxy first, morphine only for breakthrough pain if needed. Increase robaxin to 750mg  and add K pad - may be ready for discharge tomorrow or Monday if continuing to progress well   FEN: soft diet, SLIV; bowel regimen VTE: lovenox ID: Zosyn 2/27>3/2    LOS: 6 days    Wellington Hampshire, [facility] 09/23/2020, 8:27 AM Please see Amion for pager number during day hours 7:00am-4:30pm

## 2020-09-24 MED ORDER — PANTOPRAZOLE SODIUM 40 MG PO TBEC
40.0000 mg | DELAYED_RELEASE_TABLET | Freq: Two times a day (BID) | ORAL | Status: DC
  Administered 2020-09-24 – 2020-09-29 (×11): 40 mg via ORAL
  Filled 2020-09-24 (×11): qty 1

## 2020-09-24 NOTE — Plan of Care (Signed)
  Problem: Education: Goal: Knowledge of General Education information will improve Description Including pain rating scale, medication(s)/side effects and non-pharmacologic comfort measures Outcome: Progressing   

## 2020-09-24 NOTE — Progress Notes (Signed)
[facility] Progress Note  7 Days Post-Op  Subjective: Overall abdominal pain has improved, but still having some pain.   Denies n/v today, had a brief episode of nausea yesterday.  Had several soft BMs yesterday.  Tolerating soft diet, but appetite comes and goes.  Objective: Vital signs in last 24 hours: Temp:  [97.8 F (36.6 C)-98.2 F (36.8 C)] 98 F (36.7 C) (03/06 0503) Pulse Rate:  [84-99] 97 (03/06 0503) Resp:  [16-20] 16 (03/06 0503) BP: (104-138)/(54-87) 119/74 (03/06 0503) SpO2:  [97 %-98 %] 97 % (03/06 0503) Last BM Date: 09/23/20  Intake/Output from previous day: 03/05 0701 - 03/06 0700 In: -  Out: 720 [Urine:675; Drains:45] Intake/Output this shift: No intake/output data recorded.  PE: General: Alert, NAD Lungs: Respiratory effort nonlabored Abd: soft, nondistended, nontender to palpation. Midline incision c/d/i with staples in place. JP with serous drainage. Skin: warm and dry with no masses, lesions, or rashes  Lab Results:  Recent Labs    09/22/20 0716  WBC 10.2  HGB 9.9*  HCT 29.4*  PLT 340   BMET Recent Labs    09/22/20 0555  NA 135  K 4.1  CL 109  CO2 18*  GLUCOSE 113*  BUN <5*  CREATININE 0.50  CALCIUM 7.9*   PT/INR No results for input(s): LABPROT, INR in the last 72 hours. CMP     Component Value Date/Time   NA 135 09/22/2020 0555   NA 145 (H) 08/19/2017 1411   K 4.1 09/22/2020 0555   CL 109 09/22/2020 0555   CO2 18 (L) 09/22/2020 0555   GLUCOSE 113 (H) 09/22/2020 0555   BUN <5 (L) 09/22/2020 0555   BUN 13 08/19/2017 1411   CREATININE 0.50 09/22/2020 0555   CREATININE 0.76 01/15/2015 1232   CALCIUM 7.9 (L) 09/22/2020 0555   PROT 7.1 09/16/2020 2154   PROT 7.4 09/02/2018 1513   ALBUMIN 3.2 (L) 09/16/2020 2154   ALBUMIN 4.1 09/02/2018 1513   AST 19 09/16/2020 2154   ALT 15 09/16/2020 2154   ALKPHOS 52 09/16/2020 2154   BILITOT 0.6 09/16/2020 2154   BILITOT 0.2 09/02/2018 1513   GFRNONAA >60 09/22/2020  0555   GFRNONAA 78 01/03/2014 1614   GFRAA >60 07/13/2018 1416   GFRAA >89 01/03/2014 1614   Lipase     Component Value Date/Time   LIPASE 122 (H) 09/16/2020 2154   Studies/Results: No results found.  Anti-infectives: Anti-infectives (From admission, onward)    Start     Dose/Rate Route Frequency Ordered Stop   09/17/20 0600  piperacillin-tazobactam (ZOSYN) IVPB 3.375 g  Status:  Discontinued       Note to Pharmacy: Pharmacy to adjust dose as indicated   3.375 g 12.5 mL/hr over 240 Minutes Intravenous Every 8 hours 09/17/20 0506 09/20/20 0857   09/17/20 0015  piperacillin-tazobactam (ZOSYN) IVPB 3.375 g        3.375 g 100 mL/hr over 30 Minutes Intravenous  Once 09/17/20 0003 09/17/20 0114      Assessment/Plan HTN Hx of CVA Depression/Anxiety - melatonin QHS LUE swelling - contracture s/p previous CVA, [place] negative for DVT   Perforated gastric ulcer S/P exploratory laparotomy with Phillip Heal patch repair 2/27 Dr. Kae Heller - POD#7 - Upper GI reviewed - there is a small outpouching that appears to be in the proximal duodenum vs pyloric channel, possible diverticulum vs ulceration. There is no free extravasation into the peritoneum and patient otherwise appears clinically well.  - Tolerating soft diet, though minimal appetite,  encourage supplements   - Drain remains serous, will remove today - Continue to mobilize as able - Continue BID PPI - Oxycodone prn for pain, morphine only for breakthrough pain if needed. Continue robaxin 750mg , K pad - may be ready for discharge Monday if continuing to progress well - patient prefers to go home though PT currently recommending SNF, will continue to work towards home and assess for dispo plans    FEN: soft diet, SLIV; bowel regimen VTE: lovenox ID: Zosyn 2/27>3/2    LOS: 7 days    Jerilee Hoh, MD  [facility] 09/24/2020, 9:54 AM Please see Amion for pager number during day hours 7:00am-4:30pm

## 2020-09-25 NOTE — Progress Notes (Signed)
Progress Note  8 Days Post-Op  Subjective: Patient reports abdominal pain is improving slowly but she is off IV pain meds. Tolerating diet and having bowel function. She is concerned about getting home - she has some stairs in her house to be able to get to her bedroom. She has still not been out of the bed. She uses a cane at home normally. She thinks she still has a bedside commode at home.   Objective: Vital signs in last 24 hours: Temp:  [98.2 F (36.8 C)-98.5 F (36.9 C)] 98.3 F (36.8 C) (03/07 0332) Pulse Rate:  [91-93] 93 (03/07 0332) Resp:  [17-18] 17 (03/07 0332) BP: (99-105)/(69-72) 105/70 (03/07 0332) SpO2:  [96 %-97 %] 97 % (03/07 0332) Last BM Date: 09/24/20  Intake/Output from previous day: 03/06 0701 - 03/07 0700 In: 120 [P.O.:120] Out: 700 [Urine:700] Intake/Output this shift: No intake/output data recorded.  PE: General: pleasant, WD,obesefemale who is laying in bed in NAD Heart: regular, rate, and rhythm.  Lungs: CTAB, no wheezes, rhonchi, or rales noted. Respiratory effort nonlabored Abd: soft,nondistended, nontender to palpation. Midline incision c/d/i with staples in place.  Skin: warm and dry with no masses, lesions, or rashes Psych: A&Ox3 with an appropriate affect.    Lab Results:  No results for input(s): WBC, HGB, HCT, PLT in the last 72 hours. BMET No results for input(s): NA, K, CL, CO2, GLUCOSE, BUN, CREATININE, CALCIUM in the last 72 hours. PT/INR No results for input(s): LABPROT, INR in the last 72 hours. CMP     Component Value Date/Time   NA 135 09/22/2020 0555   NA 145 (H) 08/19/2017 1411   K 4.1 09/22/2020 0555   CL 109 09/22/2020 0555   CO2 18 (L) 09/22/2020 0555   GLUCOSE 113 (H) 09/22/2020 0555   BUN <5 (L) 09/22/2020 0555   BUN 13 08/19/2017 1411   CREATININE 0.50 09/22/2020 0555   CREATININE 0.76 01/15/2015 1232   CALCIUM 7.9 (L) 09/22/2020 0555   PROT 7.1 09/16/2020 2154   PROT 7.4 09/02/2018 1513   ALBUMIN  3.2 (L) 09/16/2020 2154   ALBUMIN 4.1 09/02/2018 1513   AST 19 09/16/2020 2154   ALT 15 09/16/2020 2154   ALKPHOS 52 09/16/2020 2154   BILITOT 0.6 09/16/2020 2154   BILITOT 0.2 09/02/2018 1513   GFRNONAA >60 09/22/2020 0555   GFRNONAA 78 01/03/2014 1614   GFRAA >60 07/13/2018 1416   GFRAA >89 01/03/2014 1614   Lipase     Component Value Date/Time   LIPASE 122 (H) 09/16/2020 2154       Studies/Results: No results found.  Anti-infectives: Anti-infectives (From admission, onward)   Start     Dose/Rate Route Frequency Ordered Stop   09/17/20 0600  piperacillin-tazobactam (ZOSYN) IVPB 3.375 g  Status:  Discontinued       Note to Pharmacy: Pharmacy to adjust dose as indicated   3.375 g 12.5 mL/hr over 240 Minutes Intravenous Every 8 hours 09/17/20 0506 09/20/20 0857   09/17/20 0015  piperacillin-tazobactam (ZOSYN) IVPB 3.375 g        3.375 g 100 mL/hr over 30 Minutes Intravenous  Once 09/17/20 0003 09/17/20 0114       Assessment/Plan HTN Hx of CVA Depression/Anxiety- melatonin QHS LUE swelling - contracture s/p previous CVA, [place] negative for DVT  Perforated gastric ulcer S/P exploratory laparotomy with Phillip Heal patch repair 2/27 Dr. Kae Heller - POD#8 - Upper GI reviewed - there is a small outpouching that appears to be in the  proximal duodenum vs pyloric channel, possible diverticulum vs ulceration. There is no free extravasation into the peritoneum and patient otherwise appears clinically well. - Tolerating soft diet, though minimal appetite, encourage supplements   - Drain removed 3/6 - Continue to mobilize as able - PT/OT to reassess today, pt has still not been out of bed - Continue BID PPI - possible discharge early this week but will need to see how she progresses with therapies  FEN: soft diet, SLIV; bowel regimen VTE: lovenox ID: Zosyn 2/27>3/2   LOS: 8 days    Norm Parcel , [facility] 09/25/2020, 10:11 AM Please see Amion for pager  number during day hours 7:00am-4:30pm

## 2020-09-25 NOTE — Progress Notes (Signed)
Occupational Therapy Treatment Patient Details Name: Mary Le MRN: [id number] DOB: [birth year] Today's Date: 09/25/2020    History of present illness 75 year old woman PMHx:CVA with LUE/LE weakness, back pain, HTN who presents with acute worsening of abdominal pain.  She has been having abdominal pain and vague nausea for 3 or 4 days, now acute onset of right-sided abdominal pain radiating from the right lower quadrant to the right upper quadrant into her shoulder and back. Admitted 09/16/20 for ex lap and repair of preforated pyloric channel ulcer.   OT comments  Pt in bed upon arrival, agreeable to OOB activity. Max A +2 for sitting EOB, min guard A for grooming. Sit to stand from EOB x 3-4 trials. Difficulty clearing bottom high enough for placement of stedy seat. Dependent transfer bed to recliner using stedy. OT will continue to follow acutely to maximize level of function and safety  Follow Up Recommendations  SNF;Other (comment) (Pt previously declining SNF, but may be more open to it now, if not will need max HH services)    Equipment Recommendations  Wheelchair (measurements OT);Wheelchair cushion (measurements OT);Hospital bed;Other (comment) (mechanical lift)    Recommendations for Other Services      Precautions / Restrictions Precautions Precautions: Fall Precaution Comments: h/o CVA with residual L side weakness (UE > LE) Restrictions Weight Bearing Restrictions: No LUE Weight Bearing: Non weight bearing Other Position/Activity Restrictions: LUE non functional arm - contracted.  Has a splint at home?       Mobility Bed Mobility Overal bed mobility: Needs Assistance Bed Mobility: Supine to Sit     Supine to sit: +2 for physical assistance;Max assist     General bed mobility comments: assist for all aspects of mobility, cues for sequencing, increased time, use of bed pad to scoot to EOB    Transfers Overall transfer level: Needs assistance   Transfers: Sit  to/from Stand Sit to Stand: +2 physical assistance;Max assist         General transfer comment: sit to stand from EOB x 3-4 trials. Difficulty clearing bottom high enough for placement of stedy seat. Dependent transfer bed to recliner using stedy.    Balance Overall balance assessment: Needs assistance Sitting-balance support: Feet supported;Single extremity supported Sitting balance-Leahy Scale: Fair Sitting balance - Comments: min guard assist to maintain EOB sitting   Standing balance support: Single extremity supported;During functional activity Standing balance-Leahy Scale: Poor                             ADL either performed or assessed with clinical judgement   ADL Overall ADL's : Needs assistance/impaired Eating/Feeding: Set up;Supervision/ safety;Sitting   Grooming: Wash/dry hands;Wash/dry face;Min guard;Sitting                                       Vision Baseline Vision/History: No visual deficits;Wears glasses Wears Glasses: Distance only Patient Visual Report: No change from baseline     Perception     Praxis      Cognition Arousal/Alertness: Awake/alert Behavior During Therapy: Flat affect Overall Cognitive Status: No family/caregiver present to determine baseline cognitive functioning                                 General Comments: Pleasant. Following simple commands with increased time. Difficulty sequencing.  Distracted by pain.        Exercises     Shoulder Instructions       General Comments      Pertinent Vitals/ Pain       Pain Assessment: Faces Faces Pain Scale: Hurts whole lot Pain Location: abdomen during mobility Pain Descriptors / Indicators: Grimacing;Moaning;Discomfort Pain Intervention(s): Limited activity within patient's tolerance;Monitored during session;Premedicated before session;Repositioned  Home Living                                          Prior  Functioning/Environment              Frequency  Min 2X/week        Progress Toward Goals  OT Goals(current goals can now be found in the care plan section)  Progress towards OT goals: OT to reassess next treatment  Acute Rehab OT Goals Patient Stated Goal: home OT Goal Formulation: With patient  Plan Discharge plan remains appropriate    Co-evaluation    PT/OT/SLP Co-Evaluation/Treatment: Yes Reason for Co-Treatment: Complexity of the patient's impairments (multi-system involvement);To address functional/ADL transfers;For patient/therapist safety   OT goals addressed during session: ADL's and self-care;Proper use of Adaptive equipment and DME      AM-PAC OT "6 Clicks" Daily Activity     Outcome Measure   Help from another person eating meals?: A Little Help from another person taking care of personal grooming?: A Little Help from another person toileting, which includes using toliet, bedpan, or urinal?: Total Help from another person bathing (including washing, rinsing, drying)?: Total   Help from another person to put on and taking off regular lower body clothing?: Total 6 Click Score: 9    End of Session Equipment Utilized During Treatment: Other (comment) Charlaine Dalton)  OT Visit Diagnosis: Unsteadiness on feet (R26.81);Muscle weakness (generalized) (M62.81);Pain Pain - part of body:  (abdomen)   Activity Tolerance Patient limited by pain   Patient Left with call bell/phone within reach;in chair;with chair alarm set   Nurse Communication Mobility status        Time: 5784-6962 OT Time Calculation (min): 23 min  Charges: OT General Charges $OT Visit: 1 Visit OT Treatments $Therapeutic Activity: 8-22 mins    Britt Bottom 09/25/2020, 3:56 PM

## 2020-09-25 NOTE — Progress Notes (Signed)
Physical Therapy Treatment Patient Details Name: Mary Le MRN: [id number] DOB: 03-Apr-1946 Today's Date: 09/25/2020    History of Present Illness 75 year old woman PMHx:CVA with LUE/LE weakness, back pain, HTN who presents with acute worsening of abdominal pain.  She has been having abdominal pain and vague nausea for 3 or 4 days, now acute onset of right-sided abdominal pain radiating from the right lower quadrant to the right upper quadrant into her shoulder and back. Admitted 09/16/20 for ex lap and repair of preforated pyloric channel ulcer.    PT Comments    Pt received in bed, agreeable to address transfer to recliner this session. Pt required +2 max assist to transition to EOB, min guard assist EOB sitting balance, +2 max assist sit to stand, and total/dependent transfer using bariatric stedy bed to recliner transfer. PT recommending ST SNF for further rehab, but pt declining. If pt discharges home, recommended DME listed below. Advised RN to use maximove for return to bed.    Follow Up Recommendations  SNF;Supervision/Assistance - 24 hour (Pt declining. Recommend max HH services, if she discharges home.)     [facility] bed;Other (comment) (hoyer lift)    Recommendations for Other Services       Precautions / Restrictions Precautions Precautions: Fall Precaution Comments: h/o CVA with residual L side weakness (UE > LE) Restrictions Other Position/Activity Restrictions: LUE non functional arm - contracted.  Has a splint at home?    Mobility  Bed Mobility Overal bed mobility: Needs Assistance Bed Mobility: Supine to Sit     Supine to sit: +2 for physical assistance;Max assist     General bed mobility comments: assist for all aspects of mobility, cues for sequencing, increased time, use of bed pad to scoot to EOB    Transfers Overall transfer level: Needs assistance   Transfers: Sit to/from Stand Sit to Stand: +2 physical assistance;Max  assist         General transfer comment: sit to stand from EOB x 3-4 trials. Difficulty clearing bottom high enough for placement of stedy seat. Dependent transfer bed to recliner using stedy.  Ambulation/Gait             General Gait Details: unable   Stairs             Wheelchair Mobility    Modified Rankin (Stroke Patients Only)       Balance Overall balance assessment: Needs assistance Sitting-balance support: Feet supported;Single extremity supported Sitting balance-Leahy Scale: Fair Sitting balance - Comments: min guard assist to maintain EOB sitting   Standing balance support: Single extremity supported;During functional activity Standing balance-Leahy Scale: Poor Standing balance comment: heavy reliance on external support                            Cognition Arousal/Alertness: Awake/alert Behavior During Therapy: Flat affect Overall Cognitive Status: No family/caregiver present to determine baseline cognitive functioning                                 General Comments: Pleasant. Following simple commands with increased time. Difficulty sequencing. Distracted by pain.      Exercises      General Comments        Pertinent Vitals/Pain Pain Assessment: Faces Faces Pain Scale: Hurts whole lot Pain Location: abdomen during mobility Pain Descriptors / Indicators: Grimacing;Moaning;Discomfort Pain Intervention(s): Limited activity within patient's  tolerance;Monitored during session;Repositioned;Premedicated before session    Home Living                      Prior Function            PT Goals (current goals can now be found in the care plan section) Acute Rehab PT Goals Patient Stated Goal: home Progress towards PT goals: Progressing toward goals    Frequency    Min 3X/week      PT Plan Discharge plan needs to be updated;Equipment recommendations need to be updated    Co-evaluation PT/OT/SLP  Co-Evaluation/Treatment: Yes Reason for Co-Treatment: Complexity of the patient's impairments (multi-system involvement);For patient/therapist safety PT goals addressed during session: Mobility/safety with mobility;Balance        AM-PAC PT "6 Clicks" Mobility   Outcome Measure  Help needed turning from your back to your side while in a flat bed without using bedrails?: A Lot Help needed moving from lying on your back to sitting on the side of a flat bed without using bedrails?: Total Help needed moving to and from a bed to a chair (including a wheelchair)?: Total Help needed standing up from a chair using your arms (e.g., wheelchair or bedside chair)?: Total Help needed to walk in hospital room?: Total Help needed climbing 3-5 steps with a railing? : Total 6 Click Score: 7    End of Session Equipment Utilized During Treatment: Gait belt Activity Tolerance: Patient limited by pain;Patient limited by fatigue Patient left: in chair;with call bell/phone within reach Nurse Communication: Mobility status;Need for lift equipment (maximove for return to bed) PT Visit Diagnosis: Muscle weakness (generalized) (M62.81);Difficulty in walking, not elsewhere classified (R26.2);Pain;Hemiplegia and hemiparesis;Other abnormalities of gait and mobility (R26.89) Hemiplegia - Right/Left: Left Hemiplegia - dominant/non-dominant: Non-dominant Hemiplegia - caused by: Cerebral infarction (chronic)     Time: 5364-6803 PT Time Calculation (min) (ACUTE ONLY): 26 min  Charges:  $Therapeutic Activity: 8-22 mins                     Lorrin Goodell, PT  Office # [phone] Pager [phone]    Lorriane Shire 09/25/2020, 11:27 AM

## 2020-09-25 NOTE — Progress Notes (Signed)
Wheelchair Patient suffers from Perforated gastric ulcer s/p exploratory laparotomy with graham patch repair which impairs their ability to perform daily activities like bathing, dressing, and grooming in the home.  A cane, crutch, or walker will not resolve issue with performing activities of daily living. A wheelchair will allow patient to safely perform daily activities. Patient can safely propel the wheelchair in the home or has a caregiver who can provide assistance. Length of need 6 months . Accessories: elevating leg rests (ELRs), wheel locks, extensions and anti-tippers.  Hospital Bed Patient suffers from Perforated gastric ulcer s/p exploratory laparotomy with graham patch repair which impairs their ability to perform daily activities like toileting and mobilizing in the home. He has difficulty getting in and out of bed. A hospital bed will allow patient to safely perform daily activities, and allow him to be positioned in ways not feasible with a normal bed.  Pain episodes frequently require immediate changes in body position which cannot be achieved with a normal bed.

## 2020-09-26 NOTE — Progress Notes (Addendum)
Physical Therapy Treatment Patient Details Name: Mary Le MRN: [id number] DOB: 09/11/1945 Today's Date: 09/26/2020    History of Present Illness 75 year old woman PMHx:CVA with LUE/LE weakness, back pain, HTN who presents with acute worsening of abdominal pain.  She has been having abdominal pain and vague nausea for 3 or 4 days, now acute onset of right-sided abdominal pain radiating from the right lower quadrant to the right upper quadrant into her shoulder and back. Admitted 09/16/20 for ex lap and repair of preforated pyloric channel ulcer.    PT Comments    Pt continues to require use of sara stedy and +2 assistance to move into standing.  Pt limited due to pain in abdomen and L leg and LUE.  Asked PA for soft sling to improve comfort of LUE when mobilizing.  Pt continues to benefit from snf placement to improve strength and function to decrease caregiver burden.  Pt continues to decline at this time.      Follow Up Recommendations  SNF;Supervision/Assistance - 24 hour (Pt declines snf, [facility] services if she d/c home.)     Equipment Recommendations  Hospital bed;Other (comment) (hoyer lift)    Recommendations for Other Services       Precautions / Restrictions Precautions Precautions: Fall Precaution Comments: h/o CVA with residual L side weakness (UE > LE)    Mobility  Bed Mobility Overal bed mobility: Needs Assistance Bed Mobility: Supine to Sit     Supine to sit: +2 for physical assistance;Max assist     General bed mobility comments: assistance to advance B LEs to edge of bed this session.  Pt with LLE pain and LUE pain during movement.  Pt required +2 assistance for trunk elevation and use of bed pad to scoot to edge of bed.    Transfers Overall transfer level: Needs assistance Equipment used: Ambulation equipment used (sara stedy) Transfers: Sit to/from Stand Sit to Stand: +2 physical assistance;Max assist         General transfer comment: sit to stand  from edge of bed and from sar stedy plates.  Presents with flexed posture and required cues for sequencing and safety when using sara stedy.  Poor ability to extend hips and upper trunk.  Unable to use LUE due to deficits from CVA.  LUE painful in dependent position.  Would benefit from soft sling to improve comfort with mobility.  Ambulation/Gait Ambulation/Gait assistance:  (NT)               Stairs             Wheelchair Mobility    Modified Rankin (Stroke Patients Only)       Balance Overall balance assessment: Needs assistance Sitting-balance support: Feet supported;Single extremity supported Sitting balance-Leahy Scale: Fair Sitting balance - Comments: min guard assist to maintain EOB sitting   Standing balance support: Single extremity supported;During functional activity Standing balance-Leahy Scale: Poor Standing balance comment: heavy reliance on external support                            Cognition Arousal/Alertness: Awake/alert Behavior During Therapy: Flat affect Overall Cognitive Status: No family/caregiver present to determine baseline cognitive functioning                                 General Comments: Pleasant. Following simple commands with increased time. Difficulty sequencing. Distracted by pain.  Exercises General Exercises - Lower Extremity Ankle Circles/Pumps: AROM;AAROM;Both;15 reps;Supine;Seated Long Arc Quad: AAROM;Left;10 reps;Seated Hip Flexion/Marching: AROM;Left;10 reps;Seated    General Comments        Pertinent Vitals/Pain Pain Assessment: Faces Faces Pain Scale: Hurts whole lot Pain Location: abdomen and LUE during mobility. Pain Descriptors / Indicators: Grimacing;Moaning;Discomfort Pain Intervention(s): Monitored during session;Repositioned    Home Living                      Prior Function            PT Goals (current goals can now be found in the care plan section)  Acute Rehab PT Goals Patient Stated Goal: home Potential to Achieve Goals: Fair Progress towards PT goals: Progressing toward goals    Frequency    Min 3X/week      PT Plan Current plan remains appropriate    Co-evaluation              AM-PAC PT "6 Clicks" Mobility   Outcome Measure  Help needed turning from your back to your side while in a flat bed without using bedrails?: A Lot Help needed moving from lying on your back to sitting on the side of a flat bed without using bedrails?: Total Help needed moving to and from a bed to a chair (including a wheelchair)?: Total Help needed standing up from a chair using your arms (e.g., wheelchair or bedside chair)?: Total Help needed to walk in hospital room?: Total Help needed climbing 3-5 steps with a railing? : Total 6 Click Score: 7    End of Session Equipment Utilized During Treatment: Gait belt Activity Tolerance: Patient limited by pain;Patient limited by fatigue Patient left: in chair;with call bell/phone within reach;with chair alarm set Nurse Communication: Mobility status;Need for lift equipment (maxmove pad placed in recliner chair for back to bed.) PT Visit Diagnosis: Muscle weakness (generalized) (M62.81);Difficulty in walking, not elsewhere classified (R26.2);Pain;Hemiplegia and hemiparesis;Other abnormalities of gait and mobility (R26.89) Hemiplegia - Right/Left: Left Hemiplegia - dominant/non-dominant: Non-dominant Hemiplegia - caused by: Cerebral infarction (chronic) Pain - Right/Left: Right Pain - part of body:  (abdominal incision, throat)     Time: 9323-5573 PT Time Calculation (min) (ACUTE ONLY): 18 min  Charges:  $Therapeutic Activity: 8-22 mins                     Erasmo Leventhal , PTA Acute Rehabilitation Services Pager [phone] Office [phone]     Mary Le 09/26/2020, 2:46 PM

## 2020-09-26 NOTE — Progress Notes (Signed)
Orthopedic Tech Progress Note Patient Details:  Mary Le 21-Aug-1945 [id number]  Ortho Devices Type of Ortho Device: Arm sling Ortho Device/Splint Location: LUE Ortho Device/Splint Interventions: Ordered,Application   Post Interventions Patient Tolerated: Fair Instructions Provided: Adjustment of device,Care of device   [employer] 09/26/2020, 3:27 PM

## 2020-09-26 NOTE — Progress Notes (Signed)
Progress Note  9 Days Post-Op  Subjective: Patient surgically stable. Used the stedy to transfer to recliner yesterday. Patient enjoyed the food her daughter brought for her yesterday. Patient shared with me today that her granddaughter recently passed away in an MVC and is appropriately distressed by this.   Objective: Vital signs in last 24 hours: Temp:  [97.7 F (36.5 C)-98.7 F (37.1 C)] 97.7 F (36.5 C) (03/08 0455) Pulse Rate:  [86-100] 100 (03/08 0455) Resp:  [15-17] 16 (03/08 0455) BP: (110-132)/(43-80) 132/58 (03/08 0455) SpO2:  [96 %-98 %] 96 % (03/08 0455) Last BM Date: 09/24/20  Intake/Output from previous day: 03/07 0701 - 03/08 0700 In: 605 [P.O.:605] Out: 825 [Urine:825] Intake/Output this shift: No intake/output data recorded.  PE: General: pleasant, WD,obesefemale who is laying in bed in NAD Heart: regular, rate, and rhythm.  Lungs: CTAB, no wheezes, rhonchi, or rales noted. Respiratory effort nonlabored Abd: soft,nondistended, nontender to palpation. Midline incision c/d/i with staples in place.  Skin: warm and dry with no masses, lesions, or rashes Psych: A&Ox3 with an appropriate affect.   Lab Results:  No results for input(s): WBC, HGB, HCT, PLT in the last 72 hours. BMET No results for input(s): NA, K, CL, CO2, GLUCOSE, BUN, CREATININE, CALCIUM in the last 72 hours. PT/INR No results for input(s): LABPROT, INR in the last 72 hours. CMP     Component Value Date/Time   NA 135 09/22/2020 0555   NA 145 (H) 08/19/2017 1411   K 4.1 09/22/2020 0555   CL 109 09/22/2020 0555   CO2 18 (L) 09/22/2020 0555   GLUCOSE 113 (H) 09/22/2020 0555   BUN <5 (L) 09/22/2020 0555   BUN 13 08/19/2017 1411   CREATININE 0.50 09/22/2020 0555   CREATININE 0.76 01/15/2015 1232   CALCIUM 7.9 (L) 09/22/2020 0555   PROT 7.1 09/16/2020 2154   PROT 7.4 09/02/2018 1513   ALBUMIN 3.2 (L) 09/16/2020 2154   ALBUMIN 4.1 09/02/2018 1513   AST 19 09/16/2020 2154   ALT  15 09/16/2020 2154   ALKPHOS 52 09/16/2020 2154   BILITOT 0.6 09/16/2020 2154   BILITOT 0.2 09/02/2018 1513   GFRNONAA >60 09/22/2020 0555   GFRNONAA 78 01/03/2014 1614   GFRAA >60 07/13/2018 1416   GFRAA >89 01/03/2014 1614   Lipase     Component Value Date/Time   LIPASE 122 (H) 09/16/2020 2154       Studies/Results: No results found.  Anti-infectives: Anti-infectives (From admission, onward)   Start     Dose/Rate Route Frequency Ordered Stop   09/17/20 0600  piperacillin-tazobactam (ZOSYN) IVPB 3.375 g  Status:  Discontinued       Note to Pharmacy: Pharmacy to adjust dose as indicated   3.375 g 12.5 mL/hr over 240 Minutes Intravenous Every 8 hours 09/17/20 0506 09/20/20 0857   09/17/20 0015  piperacillin-tazobactam (ZOSYN) IVPB 3.375 g        3.375 g 100 mL/hr over 30 Minutes Intravenous  Once 09/17/20 0003 09/17/20 0114       Assessment/Plan HTN Hx of CVA Depression/Anxiety- melatonin QHS LUE swelling - contracture s/p previous CVA, [place] negative for DVT  Perforated gastric ulcer S/P exploratory laparotomy with Phillip Heal patch repair 2/27 Dr. Kae Heller - POD#9 - Upper GI reviewed - there is a small outpouching that appears to be in the proximal duodenum vs pyloric channel, possible diverticulum vs ulceration. There is no free extravasation into the peritoneum and patient otherwise appears clinically well. -Tolerating soft diet, though minimal  appetite, encourage supplements -Drain removed 3/6 - Continue therapies - pt refuses SNF but not yet able to discharge home safely  -Continue BID PPI  FEN: soft diet, SLIV; bowel regimen VTE: lovenox ID: Zosyn 2/27>3/2  LOS: 9 days    Norm Parcel , [facility] 09/26/2020, 9:44 AM Please see Amion for pager number during day hours 7:00am-4:30pm

## 2020-09-26 NOTE — Progress Notes (Addendum)
Spoke with pt and family about safety for home. The daughter and pt does not want snf placement. Daughter states pt has bedside commode, gait belts, recliner, and periwick already. Discuss the benefits of going to outside rehab, both parties have agree to have HHPT if possible. Daughter states she will be here tomorrow morning.

## 2020-09-27 NOTE — Progress Notes (Signed)
Progress Note  10 Days Post-Op  Subjective: Patient is tolerating diet and having bowel function. She sat up in the chair for about 3 hrs yesterday. She is still trying to get stronger, still refusing SNF placement. She reports her daughter is planning on visiting this AM. She is understandably very upset about the loss of her granddaughter.   Objective: Vital signs in last 24 hours: Temp:  [97.7 F (36.5 C)] 97.7 F (36.5 C) (03/08 1329) Pulse Rate:  [83] 83 (03/08 1329) Resp:  [17] 17 (03/08 1329) BP: (133)/(81) 133/81 (03/08 1329) SpO2:  [100 %] 100 % (03/08 1329) Last BM Date: 09/24/20  Intake/Output from previous day: 03/08 0701 - 03/09 0700 In: 175 [P.O.:175] Out: 200 [Urine:200] Intake/Output this shift: No intake/output data recorded.  PE: General: pleasant, WD,obesefemale who is laying in bed in NAD Heart: regular, rate, and rhythm.  Lungs: CTAB, no wheezes, rhonchi, or rales noted. Respiratory effort nonlabored Abd: soft,nondistended, nontender to palpation. Midline incision c/d/i with staples in place.  Skin: warm and dry with no masses, lesions, or rashes Psych: A&Ox3 with an appropriate affect.   Lab Results:  No results for input(s): WBC, HGB, HCT, PLT in the last 72 hours. BMET No results for input(s): NA, K, CL, CO2, GLUCOSE, BUN, CREATININE, CALCIUM in the last 72 hours. PT/INR No results for input(s): LABPROT, INR in the last 72 hours. CMP     Component Value Date/Time   NA 135 09/22/2020 0555   NA 145 (H) 08/19/2017 1411   K 4.1 09/22/2020 0555   CL 109 09/22/2020 0555   CO2 18 (L) 09/22/2020 0555   GLUCOSE 113 (H) 09/22/2020 0555   BUN <5 (L) 09/22/2020 0555   BUN 13 08/19/2017 1411   CREATININE 0.50 09/22/2020 0555   CREATININE 0.76 01/15/2015 1232   CALCIUM 7.9 (L) 09/22/2020 0555   PROT 7.1 09/16/2020 2154   PROT 7.4 09/02/2018 1513   ALBUMIN 3.2 (L) 09/16/2020 2154   ALBUMIN 4.1 09/02/2018 1513   AST 19 09/16/2020 2154   ALT  15 09/16/2020 2154   ALKPHOS 52 09/16/2020 2154   BILITOT 0.6 09/16/2020 2154   BILITOT 0.2 09/02/2018 1513   GFRNONAA >60 09/22/2020 0555   GFRNONAA 78 01/03/2014 1614   GFRAA >60 07/13/2018 1416   GFRAA >89 01/03/2014 1614   Lipase     Component Value Date/Time   LIPASE 122 (H) 09/16/2020 2154       Studies/Results: No results found.  Anti-infectives: Anti-infectives (From admission, onward)   Start     Dose/Rate Route Frequency Ordered Stop   09/17/20 0600  piperacillin-tazobactam (ZOSYN) IVPB 3.375 g  Status:  Discontinued       Note to Pharmacy: Pharmacy to adjust dose as indicated   3.375 g 12.5 mL/hr over 240 Minutes Intravenous Every 8 hours 09/17/20 0506 09/20/20 0857   09/17/20 0015  piperacillin-tazobactam (ZOSYN) IVPB 3.375 g        3.375 g 100 mL/hr over 30 Minutes Intravenous  Once 09/17/20 0003 09/17/20 0114       Assessment/Plan HTN Hx of CVA Depression/Anxiety- melatonin QHS LUE swelling - contracture s/p previous CVA, [place] negative for DVT  Perforated gastric ulcer S/P exploratory laparotomy with Phillip Heal patch repair 2/27 Dr. Kae Heller - POD#10 - Upper GI reviewed POD#5 without leakage of contrast -Tolerating soft diet, though minimal appetite, encourage supplements -Drain removed 3/6 - Continue therapies - pt refuses SNF but not yet able to discharge home safely  -Continue BID  PPI - daughter is planning to come in today, I will try to come back by and speak with her  FEN: soft diet, SLIV; bowel regimen VTE: lovenox ID: Zosyn 2/27>3/2  LOS: 10 days    Norm Parcel , [facility] 09/27/2020, 8:46 AM Please see Amion for pager number during day hours 7:00am-4:30pm

## 2020-09-27 NOTE — Discharge Summary (Addendum)
[facility] Discharge Summary   Patient ID: AKELIA HUSTED MRN: [id number] DOB/AGE: February 12, 1946 75 y.o.  Admit date: 09/16/2020 Discharge date: 09/29/2020  Admitting Diagnosis: Perforated gastric ulcer  Discharge Diagnosis Patient Active Problem List   Diagnosis Date Noted   Pressure injury of skin 09/19/2020   Status post surgery 09/17/2020   Perforated abdominal viscus 09/17/2020   Peripheral arterial disease ([place]) 09/02/2018   HTN (hypertension) 07/26/2015   Obesity (BMI 30.0-34.9) 07/26/2015   Syncope 12/06/2014   Spastic hemiplegia affecting nondominant side ([place]) 07/25/2014   Spondylosis of lumbar region without myelopathy or radiculopathy 06/20/2014   Left hemiparesis ([place]) 06/10/2014   Left-sided neglect 06/10/2014   Thrombotic stroke involving right middle cerebral artery ([place]) 06/08/2014   Acute respiratory failure with hypoxia ([place]) 06/05/2014   Cerebral infarction due to occlusion of right middle cerebral artery ([place]) 06/03/2014   Cerebral infarct ([place]) 06/03/2014   Altered mental status 06/03/2014   GERD (gastroesophageal reflux disease) 04/12/2014   Dyslipidemia- statin intol 02/07/2014   Chest pain- Low risk Myoview July 2015 02/06/2014    Consultants None  Imaging: No results found.  Procedures Dr. Romana Juniper (09/17/20) - exploratory laparotomy, Phillip Heal patch repair   [facility] Hospital Course:  Patient is a 75 year old female who presented to [facility] with abdominal pain.  Workup showed perforated viscus felt likely to be from a gastric ulcer.  Patient was admitted and underwent procedure listed above.  Tolerated procedure well and was transferred to the floor. On POD#5 UGI was done and did not show a leak. Diet was advanced as tolerated. Drain was monitored with diet advancement and remained serosanguinous, drain was removed 3/6. Patient was evaluated by PT/OT and SNF was recommended but patient and daughter initially refused this and  wanted to go home with home health. Patient continued to work with therapies but was unable to progress to a point where she and her daughter felt they could manage on their own at home and patient decided to go to SNF. COVID test was negative 09/28/20. Staples will be removed prior to discharge.   On POD#12, the patient was voiding well, tolerating diet, ambulating well, pain well controlled, vital signs stable, incisions c/d/i and felt stable for discharge home.  Patient will follow up in our office in 3 weeks and knows to call with questions or concerns. She will call to confirm appointment date/time.   Physical Exam: General: pleasant, WD,obesefemale who is laying in bed in NAD Heart: regular, rate, and rhythm.  Lungs: CTAB, no wheezes, rhonchi, or rales noted. Respiratory effort nonlabored Abd: soft,nondistended, nontender to palpation. Midline incision c/d/i with staples in place.  Skin: warm and dry with no masses, lesions, or rashes Psych: A&Ox3 with an appropriate affect.  I or a member of my team have reviewed this patient in the Controlled Substance Database.   Allergies as of 09/29/2020      Reactions   Tizanidine Itching   Aspirin Nausea And Vomiting   Tolerated baby aspirin, but with more than once per day of full strength for aches and pains - had stomach upset.  No history of PUD/gastric bleeding known.    Nsaids Other (See Comments)   Hx of gastric ulcer    Statins Other (See Comments)   Myalgias and chest pain (has tried Lipitor and Pravachol)      Medication List    STOP taking these medications   Evolocumab 140 MG/ML Soaj Commonly known as: Repatha SureClick   meloxicam 15  MG tablet Commonly known as: MOBIC     TAKE these medications   acetaminophen 325 MG tablet Commonly known as: TYLENOL Take 650 mg by mouth every 6 (six) hours as needed for mild pain, fever or headache.   aspirin 81 MG tablet Take 81 mg by mouth daily.   CHLOROPHYLL PO Take 1  tablet by mouth daily.   docusate sodium 100 MG capsule Commonly known as: COLACE Take 1 capsule (100 mg total) by mouth 2 (two) times daily.   feeding supplement Liqd Take 237 mLs by mouth 2 (two) times daily between meals.   fluticasone 50 MCG/ACT nasal spray Commonly known as: FLONASE Place 1 spray into both nostrils daily as needed for congestion.   lidocaine 5 % Commonly known as: LIDODERM Place 1 patch onto the skin daily. Remove & Discard patch within 12 hours or as directed by MD   losartan 100 MG tablet Commonly known as: COZAAR Take 100 mg by mouth daily.   melatonin 5 MG Tabs Take 1 tablet (5 mg total) by mouth at bedtime.   methocarbamol 750 MG tablet Commonly known as: ROBAXIN Take 1 tablet (750 mg total) by mouth 3 (three) times daily.   metoprolol tartrate 50 MG tablet Commonly known as: LOPRESSOR Take 50 mg by mouth 2 (two) times daily.   multivitamin with minerals Tabs tablet Take 1 tablet by mouth daily. Reported on 11/22/2015   oxyCODONE 5 MG immediate release tablet Commonly known as: Oxy IR/ROXICODONE Take 1-2 tablets (5-10 mg total) by mouth every 4 (four) hours as needed for moderate pain or severe pain.   pantoprazole 40 MG tablet Commonly known as: PROTONIX Take 1 tablet (40 mg total) by mouth 2 (two) times daily.   polyethylene glycol 17 g packet Commonly known as: MIRALAX / GLYCOLAX Take 17 g by mouth daily.   VITAMIN B-12 PO Take 1 tablet by mouth daily.            Durable Medical Equipment  (From admission, onward)         Start     Ordered   09/25/20 1445  For home use only DME Hospital bed  Once       Question Answer Comment  Length of Need 6 Months   Patient has (list medical condition): Perforated gastric ulcer s/p exploratory laparotomy with graham patch repair   The above medical condition requires: Patient requires the ability to reposition immediately   Bed type Semi-electric   Hoyer Lift Yes      09/25/20 1449    09/25/20 1445  For home use only DME standard manual wheelchair with seat cushion  Once       Comments: Patient suffers from Perforated gastric ulcer s/p exploratory laparotomy with graham patch repair which impairs their ability to perform daily activities like bathing, dressing, and grooming in the home.  A cane, crutch, or walker will not resolve issue with performing activities of daily living. A wheelchair will allow patient to safely perform daily activities. Patient can safely propel the wheelchair in the home or has a caregiver who can provide assistance. Length of need 6 months . Accessories: elevating leg rests (ELRs), wheel locks, extensions and anti-tippers.   09/25/20 1449            Contact information for follow-up providers    Clovis Riley, MD. Go on 10/25/2020.   Specialty: General Surgery Why: Follow up appointment scheduled for 9:10. Please arrive 15 min prior to appointment time  for check in.  Contact information: [address] [place] [address] [phone]        Charlett Blake, MD. Call in 3 week(s).   Specialty: Physical Medicine and Rehabilitation Why: Call and schedule follow up for botox injection to LUE Contact information: [place] [address] [phone]        Marcie Mowers, FNP. Call.   Specialty: Family Medicine Why: Call and schedule an appointment to be seen after you are discharged from rehab facility  Contact information: [address] [address] [phone]            Contact information for after-discharge care    Destination    [facility] Preferred SNF .   Service: Skilled Nursing Contact information: [address] [place] [phone]                  Signed: Norm Parcel , [facility] 09/29/2020, 8:55 AM Please see Amion for pager number during day hours 7:00am-4:30pm

## 2020-09-27 NOTE — TOC Initial Note (Addendum)
Transition of Care [facility]) - Initial/Assessment Note    Patient Details  Name: Mary Le MRN: [id number] Date of Birth: October 18, 1945  Transition [facility] CM/SW Contact:    Marilu Favre, RN Phone Number: 09/27/2020, 2:43 PM  Clinical Narrative:                 Patient from home with daughter [name]. Speak to both at bedside.  Discussed PT recommendations for SNF. Patient is declining and requesting [place] services. Discussed home health would not be in the home daily or for long periods of time. Both voiced understanding.   Referral for [facility] PT,OT aide and SW given to [facility] with Alvis Lemmings , he will call [name] directly to discuss home health services.   Patient already has a wheel chair at home. She does need a hospital bed and hoyer lift.   [facility] checking on availability of hospital bed. Await call back.   PCP is DR Aseno [phone].   Patient will need PTAR home. Confirmed address.    Freda Munro with [facility] does not have any hospital beds available.   Called [facility] left a message.   Called [employer] spoke to [place] they do not have any hospital beds.   Called [facility] they have hospital beds but no hoyer lifts.   NCM ordered hospital bed through [place] and hoyer lift through [place]. Both agencies will call [name] for delivery. [name] aware   Hospital bed orders faxed to [place] at [place] called NCM back. After working with PT , they have decided on SNF. They do NOT want [facility]. NCM completed and faxed. Will provide bed offers when available   Expected Discharge Plan: [place]     Patient Goals and CMS Choice Patient states their goals for this hospitalization and ongoing recovery are:: to return to home CMS Medicare.gov Compare Post Acute Care list provided to:: Patient Choice offered to / list presented to : [place]  Expected Discharge Plan and Services Expected Discharge Plan: [place]   Discharge Planning Services: CM Consult   Living arrangements for the past 2 months: Single Family Home                 DME Arranged: Hospital bed (hoyer) DME Agency: [facility] Date DME Agency Contacted: 09/27/20 Time DME Agency Contacted: [phone] Representative spoke with at DME Agency: [place]: PT,OT,Nurse's Aide,Refused SNF,Social Work [employer] Agency: [place] Date [place]: 09/27/20 Time [place]: [place] Representative spoke with at [place]: [place] Arrangements/Services Living arrangements for the past 2 months: [place] Lives with:: Adult Children   Do you feel safe going back to the place where you live?: Yes      Need for Family Participation in Patient Care: Yes (Comment) Care giver support system in place?: Yes (comment) Current home services: DME Criminal Activity/Legal Involvement Pertinent to Current Situation/Hospitalization: No - Comment as needed  Activities of Daily Living      Permission Sought/Granted   Permission granted to share information with : Yes, Verbal Permission Granted  Share Information with NAME: [name] [phone]           Emotional Assessment Appearance:: Appears stated age Attitude/Demeanor/Rapport: Engaged Affect (typically observed): Accepting Orientation: : Oriented to Self,Oriented to Place,Oriented to  Time,Oriented to Situation Alcohol / Substance Use: Not Applicable Psych Involvement:  No (comment)  Admission diagnosis:  Perforated ulcer ([place]) [K27.5] Status post surgery [Z98.890] Perforated abdominal viscus [R19.8] Patient Active Problem List   Diagnosis Date Noted  . Pressure injury of skin 09/19/2020  . Status post surgery 09/17/2020  . Perforated abdominal viscus 09/17/2020  . Peripheral arterial disease ([place]) 09/02/2018  . HTN (hypertension) 07/26/2015  . Obesity (BMI 30.0-34.9) 07/26/2015  . Syncope 12/06/2014  . Spastic  hemiplegia affecting nondominant side ([place]) 07/25/2014  . 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 Left hemiparesis ([place]) 06/10/2014  . Left-sided neglect 06/10/2014  . Thrombotic stroke involving right middle cerebral artery ([place]) 06/08/2014  . Acute respiratory failure with hypoxia ([place]) 06/05/2014  . Cerebral infarction due to occlusion of right middle cerebral artery ([place]) 06/03/2014  . Cerebral infarct ([place]) 06/03/2014  . Altered mental status 06/03/2014  . GERD (gastroesophageal reflux disease) 04/12/2014  . Dyslipidemia- statin intol 02/07/2014  . Chest pain- Low risk Myoview July 2015 02/06/2014   PCP:  Patient, No Pcp Per Pharmacy:   [facility] [place] - [address] [place] [address] Phone: [phone] Fax: [facility] [place], [place] [address] Phone: [phone] Fax: [phone]     Social Determinants of Health (SDOH) Interventions    Readmission Risk Interventions No flowsheet data found.

## 2020-09-27 NOTE — NC FL2 (Signed)
[place] LEVEL OF CARE SCREENING TOOL     IDENTIFICATION  Patient Name: Mary Le Birthdate: Jun 25, 1946 Sex: female Admission Date (Current Location): 09/16/2020  [facility] and [place] Number:  Herbalist and Address:  [facility]. [facility], [place] [address] [place], [place] [address]      Provider Number: [id number]  Attending Physician Name and Address:  Edison Pace, Md, MD  Relative Name and Phone Number:       Current Level of Care: Hospital Recommended Level of Care: [place] Prior Approval Number:    Date Approved/Denied:   PASRR Number: [id number]  Discharge Plan: SNF    Current Diagnoses: Patient Active Problem List   Diagnosis Date Noted  . Pressure injury of skin 09/19/2020  . Status post surgery 09/17/2020  . Perforated abdominal viscus 09/17/2020  . Peripheral arterial disease ([place]) 09/02/2018  . HTN (hypertension) 07/26/2015  . Obesity (BMI 30.0-34.9) 07/26/2015  . Syncope 12/06/2014  . Spastic hemiplegia affecting nondominant side ([place]) 07/25/2014  . 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 Left hemiparesis ([place]) 06/10/2014  . Left-sided neglect 06/10/2014  . Thrombotic stroke involving right middle cerebral artery ([place]) 06/08/2014  . Acute respiratory failure with hypoxia ([place]) 06/05/2014  . Cerebral infarction due to occlusion of right middle cerebral artery ([place]) 06/03/2014  . Cerebral infarct ([place]) 06/03/2014  . Altered mental status 06/03/2014  . GERD (gastroesophageal reflux disease) 04/12/2014  . Dyslipidemia- statin intol 02/07/2014  . Chest pain- Low risk Myoview July 2015 02/06/2014    Orientation RESPIRATION BLADDER Height & Weight     Self,Time,Situation,Place  Normal Continent Weight: 91.2 kg Height:  5\' 4"  (162.6 cm)  BEHAVIORAL SYMPTOMS/MOOD NEUROLOGICAL BOWEL NUTRITION STATUS      Continent Diet  AMBULATORY STATUS COMMUNICATION OF NEEDS Skin    Extensive Assist Verbally Surgical wounds (surgical incision, stage 2 to right breast (foam dressing) , stage 2 to lt buttock ( foam dressing))                       Personal Care Assistance Level of Assistance  Bathing,Feeding,Dressing Bathing Assistance: Maximum assistance Feeding assistance: Maximum assistance Dressing Assistance: Maximum assistance     Functional Limitations Info  Sight Sight Info: Adequate        SPECIAL CARE FACTORS FREQUENCY  PT (By licensed PT),OT (By licensed OT)     PT Frequency: five times a week OT Frequency: five times a week            Contractures Contractures Info: Not present    Additional Factors Info  Code Status,Allergies Code Status Info: full code Allergies Info: tizanidine, NSAIDS , Aspirin, statins           Current Medications (09/27/2020):  This is the current hospital active medication list Current Facility-Administered Medications  Medication Dose Route Frequency Provider Last Rate Last Admin  . acetaminophen (TYLENOL) tablet 650 mg  650 mg Oral Q6H Norm Parcel, PA-C   650 mg at 09/27/20 4967  . bisacodyl (DULCOLAX) suppository 10 mg  10 mg Rectal Daily PRN Romana Juniper A, MD      . diphenhydrAMINE (BENADRYL) 12.5 MG/5ML elixir 12.5 mg  12.5 mg Oral Q6H PRN Romana Juniper A, MD      . docusate sodium (COLACE) capsule 100 mg  100 mg Oral BID Norm Parcel, PA-C   100 mg at 09/27/20 5916  . enoxaparin (LOVENOX) injection 40  mg  40 mg Subcutaneous Q24H Romana Juniper A, MD   40 mg at 09/26/20 2019  . feeding supplement (ENSURE SURGERY) liquid 237 mL  237 mL Oral BID BM Norm Parcel, PA-C   237 mL at 09/26/20 1029  . hydrALAZINE (APRESOLINE) injection 10 mg  10 mg Intravenous Q2H PRN Romana Juniper A, MD      . lactulose (CHRONULAC) 10 GM/15ML solution 10 g  10 g Oral Daily PRN Norm Parcel, PA-C      . lidocaine (LIDODERM) 5 % 1 patch  1 patch Transdermal Q24H Jillyn Ledger, PA-C   1 patch at  09/27/20 1450  . MEDLINE mouth rinse  15 mL Mouth Rinse q12n4p Romana Juniper A, MD   15 mL at 09/27/20 1615  . melatonin tablet 5 mg  5 mg Oral QHS Norm Parcel, PA-C   5 mg at 09/26/20 2116  . menthol-cetylpyridinium (CEPACOL) lozenge 3 mg  1 lozenge Oral PRN Norm Parcel, PA-C      . methocarbamol (ROBAXIN) tablet 750 mg  750 mg Oral TID Meuth, Brooke A, PA-C   750 mg at 09/27/20 0943  . metoprolol tartrate (LOPRESSOR) injection 5 mg  5 mg Intravenous Q6H PRN Romana Juniper A, MD   5 mg at 09/20/20 1706  . metoprolol tartrate (LOPRESSOR) tablet 50 mg  50 mg Oral BID Dwan Bolt, MD   50 mg at 09/27/20 0943  . morphine 2 MG/ML injection 2 mg  2 mg Intravenous Q4H PRN Meuth, Brooke A, PA-C   2 mg at 09/27/20 1450  . ondansetron (ZOFRAN-ODT) disintegrating tablet 4 mg  4 mg Oral Q6H PRN Romana Juniper A, MD   4 mg at 09/23/20 1247   Or  . ondansetron (ZOFRAN) injection 4 mg  4 mg Intravenous Q6H PRN Clovis Riley, MD   4 mg at 09/20/20 1912  . oxyCODONE (Oxy IR/ROXICODONE) immediate release tablet 5-10 mg  5-10 mg Oral Q4H PRN Norm Parcel, PA-C   5 mg at 09/27/20 3532  . pantoprazole (PROTONIX) EC tablet 40 mg  40 mg Oral BID Berenice Bouton Z, RPH   40 mg at 09/27/20 0943  . phenol (CHLORASEPTIC) mouth spray 1 spray  1 spray Mouth/Throat PRN Clovis Riley, MD   1 spray at 09/19/20 1733  . polyethylene glycol (MIRALAX / GLYCOLAX) packet 17 g  17 g Oral Daily Norm Parcel, PA-C   17 g at 09/27/20 9924     Discharge Medications: Please see discharge summary for a list of discharge medications.  Relevant Imaging Results:  Relevant Lab Results:   Additional Information ss [phone], has not been covid vaccinated , room air s/p exp lap graham patch  Shervon Kerwin, Edson Snowball, RN

## 2020-09-27 NOTE — Progress Notes (Signed)
Occupational Therapy Treatment Patient Details Name: Mary Le MRN: [id number] DOB: [birth year] Today's Date: 09/27/2020    History of present illness 75 year old woman PMHx:CVA with LUE/LE weakness, back pain, HTN who presents with acute worsening of abdominal pain.  She has been having abdominal pain and vague nausea for 3 or 4 days, now acute onset of right-sided abdominal pain radiating from the right lower quadrant to the right upper quadrant into her shoulder and back. Admitted 09/16/20 for ex lap and repair of preforated pyloric channel ulcer.   OT comments  Pt in bed upon arrival with daughter present. Attempted sit - stand with Stedy x 2 with OT and NT, pt unable to clear bottom enought to sit on Stedy seat. Pt with increases pain and fear of falling. Brought in hoyer to get pt OOB. Pt total A with bed mobility to sit EOB and had been able to get OOB with use of Stedy last 2 sessions with OT/PT. Pt and daughter continue to refuse ST SNF rehab. OT will continue to follow acutely to maximize level of function and safety  Follow Up Recommendations  SNF;Supervision/Assistance - 24 hour (pt and daughter still refusing SNF)    Equipment Recommendations  Wheelchair (measurements OT);Wheelchair cushion (measurements OT);Hospital bed;Other (comment) (mechanical lift)    Recommendations for Other Services      Precautions / Restrictions Precautions Precautions: Fall Precaution Comments: h/o CVA with residual L side weakness (UE > LE) Restrictions Weight Bearing Restrictions: No LUE Weight Bearing: Non weight bearing Other Position/Activity Restrictions: LUE non functional arm - contracted.  Has a splint at home?       Mobility Bed Mobility Overal bed mobility: Needs Assistance Bed Mobility: Supine to Sit;Sit to Supine Rolling: Max assist;+2 for physical assistance;Total assist Sidelying to sit: Total assist;HOB elevated;+2 for physical assistance Supine to sit: Total assist;+2 for  physical assistance     General bed mobility comments: assistance to advance B LEs to edge of bed this session.  Pt with LLE pain and LUE pain during movement.  Pt required +2 assistance for trunk elevation and use of bed pad to scoot to edge of bed.    Transfers Overall transfer level: Needs assistance Equipment used: Ambulation equipment used (hoyer lift) Transfers: Sit to/from Stand Sit to Stand: Total assist;+2 physical assistance         General transfer comment: attempted sit - stand wih Stedy x 2 with OT and NT, pt unable to clear bottom enought to sit on Stedy seat. Pt with increases pain and fear of falling. Brough in hoyer to get pt OOB    Balance Overall balance assessment: Needs assistance Sitting-balance support: Feet supported;Single extremity supported Sitting balance-Leahy Scale: Fair Sitting balance - Comments: min guard assist to maintain EOB sitting     Standing balance-Leahy Scale: Zero                             ADL either performed or assessed with clinical judgement   ADL Overall ADL's : Needs assistance/impaired     Grooming: Wash/dry hands;Wash/dry face;Min guard;Sitting                                       Vision Baseline Vision/History: No visual deficits;Wears glasses Wears Glasses: Distance only Patient Visual Report: No change from baseline     Perception  Praxis      Cognition Arousal/Alertness: Awake/alert Behavior During Therapy: WFL for tasks assessed/performed Overall Cognitive Status: Within Functional Limits for tasks assessed                                          Exercises     Shoulder Instructions       General Comments      Pertinent Vitals/ Pain       Pain Assessment: Faces Faces Pain Scale: Hurts even more Pain Location: abdomen and LUE during mobility. Pain Descriptors / Indicators: Grimacing;Moaning;Discomfort Pain Intervention(s): Monitored during  session;Limited activity within patient's tolerance  Home Living                                          Prior Functioning/Environment              Frequency  Min 2X/week        Progress Toward Goals  OT Goals(current goals can now be found in the care plan section)        Plan Discharge plan remains appropriate    Co-evaluation                 AM-PAC OT "6 Clicks" Daily Activity     Outcome Measure                    End of Session Equipment Utilized During Treatment: Other (comment) Charlaine Dalton, hoyer)  OT Visit Diagnosis: Unsteadiness on feet (R26.81);Muscle weakness (generalized) (M62.81);Pain Pain - Right/Left: Left Pain - part of body:  (L UE, L LEm abdomen)   Activity Tolerance Patient limited by pain   Patient Left with call bell/phone within reach;in chair;with chair alarm set   Nurse Communication Mobility status        Time: 7001-7494 OT Time Calculation (min): 58 min  Charges: OT General Charges $OT Visit: 1 Visit OT Treatments $Self Care/Home Management : 8-22 mins $Therapeutic Activity: 38-52 mins     Britt Bottom 09/27/2020, 4:53 PM

## 2020-09-28 LAB — SARS CORONAVIRUS 2 (TAT 6-24 HRS): SARS Coronavirus 2: NEGATIVE

## 2020-09-28 NOTE — Progress Notes (Signed)
Progress Note  11 Days Post-Op  Subjective: Patient reports abdomen is stable. No BM yesterday but passing flatus and denies nausea. Prefers the food her daughter brings her. After working with OT yesterday she and her daughter are looking into SNF placement but also trying to see if more family could come help.   Objective: Vital signs in last 24 hours: Temp:  [97.3 F (36.3 C)-98.2 F (36.8 C)] 97.3 F (36.3 C) (03/10 0647) Pulse Rate:  [87-99] 87 (03/10 0647) Resp:  [16-18] 16 (03/10 0647) BP: (111-159)/(41-85) 131/85 (03/10 0647) SpO2:  [96 %-100 %] 96 % (03/10 0647) Last BM Date: 09/24/20  Intake/Output from previous day: 03/09 0701 - 03/10 0700 In: 540 [P.O.:540] Out: 200 [Urine:200] Intake/Output this shift: No intake/output data recorded.  PE: General: pleasant, WD,obesefemale who is laying in bed in NAD Heart: regular, rate, and rhythm.  Lungs: CTAB, no wheezes, rhonchi, or rales noted. Respiratory effort nonlabored Abd: soft,nondistended, nontender to palpation. Midline incision c/d/i with staples in place.  Skin: warm and dry with no masses, lesions, or rashes Psych: A&Ox3 with an appropriate affect.  Lab Results:  No results for input(s): WBC, HGB, HCT, PLT in the last 72 hours. BMET No results for input(s): NA, K, CL, CO2, GLUCOSE, BUN, CREATININE, CALCIUM in the last 72 hours. PT/INR No results for input(s): LABPROT, INR in the last 72 hours. CMP     Component Value Date/Time   NA 135 09/22/2020 0555   NA 145 (H) 08/19/2017 1411   K 4.1 09/22/2020 0555   CL 109 09/22/2020 0555   CO2 18 (L) 09/22/2020 0555   GLUCOSE 113 (H) 09/22/2020 0555   BUN <5 (L) 09/22/2020 0555   BUN 13 08/19/2017 1411   CREATININE 0.50 09/22/2020 0555   CREATININE 0.76 01/15/2015 1232   CALCIUM 7.9 (L) 09/22/2020 0555   PROT 7.1 09/16/2020 2154   PROT 7.4 09/02/2018 1513   ALBUMIN 3.2 (L) 09/16/2020 2154   ALBUMIN 4.1 09/02/2018 1513   AST 19 09/16/2020 2154    ALT 15 09/16/2020 2154   ALKPHOS 52 09/16/2020 2154   BILITOT 0.6 09/16/2020 2154   BILITOT 0.2 09/02/2018 1513   GFRNONAA >60 09/22/2020 0555   GFRNONAA 78 01/03/2014 1614   GFRAA >60 07/13/2018 1416   GFRAA >89 01/03/2014 1614   Lipase     Component Value Date/Time   LIPASE 122 (H) 09/16/2020 2154       Studies/Results: No results found.  Anti-infectives: Anti-infectives (From admission, onward)   Start     Dose/Rate Route Frequency Ordered Stop   09/17/20 0600  piperacillin-tazobactam (ZOSYN) IVPB 3.375 g  Status:  Discontinued       Note to Pharmacy: Pharmacy to adjust dose as indicated   3.375 g 12.5 mL/hr over 240 Minutes Intravenous Every 8 hours 09/17/20 0506 09/20/20 0857   09/17/20 0015  piperacillin-tazobactam (ZOSYN) IVPB 3.375 g        3.375 g 100 mL/hr over 30 Minutes Intravenous  Once 09/17/20 0003 09/17/20 0114       Assessment/Plan HTN Hx of CVA Depression/Anxiety- melatonin QHS LUE swelling - contracture s/p previous CVA, [place] negative for DVT  Perforated gastric ulcer S/P exploratory laparotomy with Phillip Heal patch repair 2/27 Dr. Kae Heller - POD#11 - Upper GI reviewed POD#5 without leakage of contrast -Tolerating soft diet, though minimal appetite, encourage supplements -Drain removed 3/6 - Continuetherapies - now considering SNF placement -Continue BID PPI - pt is medically stable for discharge   FEN:  soft diet, SLIV; bowel regimen VTE: lovenox ID: Zosyn 2/27>3/2  LOS: 11 days    Norm Parcel , [facility] 09/28/2020, 9:54 AM Please see Amion for pager number during day hours 7:00am-4:30pm

## 2020-09-28 NOTE — TOC Progression Note (Addendum)
Transition of Care [facility]) - Progression Note    Patient Details  Name: Mary Le MRN: [id number] Date of Birth: 03-Aug-1945  Transition of Care [facility]) CM/SW Contact  Jacalyn Lefevre Edson Snowball, RN Phone Number: 09/28/2020, 10:51 AM  Clinical Narrative:     Gave patient SNF bed offers from Medicare.gov website.   Called daughter [name] and emailed her the Medicare.gov list of bed offers. They will review ratings and let NCM know decision. Daughter has NCM direct cell phone number.    [name] called back they have decided on [place], [employer], [place] .  NCM called Miquel Dunn they will check to see if they have an isolation room since patient has not been vaccinated ( was on the [facility]) . Awaiting call back. She will need new covid test   Miquel Dunn can take tomorrow requested new covid test.   PA will order covid test today. Patient can go to [employer] . Patient aware. NCM offered to call daughter. Patient stated she will let her daughter know.  Expected Discharge Plan: [place]    Expected Discharge Plan and Services Expected Discharge Plan: [place]   Discharge Planning Services: CM Consult   Living arrangements for the past 2 months: Single Family Home                 DME Arranged: Hospital bed (hoyer) DME Agency: [employer] Date DME Agency Contacted: 09/27/20 Time DME Agency Contacted: [phone] Representative spoke with at DME Agency: [place]: PT,OT,Nurse's Aide,Refused SNF,Social Work [facility] Agency: [place] Date [place]: 09/27/20 Time [place]: [place] Representative spoke with at [place]:  ([place]) Interventions    Readmission Risk Interventions No flowsheet data found.

## 2020-09-29 MED ORDER — DOCUSATE SODIUM 100 MG PO CAPS: 100.0000 mg | ORAL_CAPSULE | Freq: Two times a day (BID) | ORAL | Status: AC

## 2020-09-29 MED ORDER — LIDOCAINE 5 % EX PTCH: 1.0000 | MEDICATED_PATCH | CUTANEOUS | 0 refills | Status: AC

## 2020-09-29 MED ORDER — PANTOPRAZOLE SODIUM 40 MG PO TBEC
40.0000 mg | DELAYED_RELEASE_TABLET | Freq: Two times a day (BID) | ORAL | 0 refills | Status: AC
Start: 2020-09-29 — End: 2020-10-29

## 2020-09-29 MED ORDER — MELATONIN 5 MG PO TABS
5.0000 mg | ORAL_TABLET | Freq: Every day | ORAL | 0 refills | Status: AC
Start: 2020-09-29 — End: ?

## 2020-09-29 MED ORDER — METHOCARBAMOL 750 MG PO TABS: 750.0000 mg | ORAL_TABLET | Freq: Three times a day (TID) | ORAL | Status: AC

## 2020-09-29 MED ORDER — ENSURE SURGERY PO LIQD: 237.0000 mL | Freq: Two times a day (BID) | ORAL | Status: AC

## 2020-09-29 MED ORDER — POLYETHYLENE GLYCOL 3350 17 G PO PACK: 17.0000 g | PACK | Freq: Every day | ORAL | 0 refills | Status: AC

## 2020-09-29 MED ORDER — OXYCODONE HCL 5 MG PO TABS: 5.0000 mg | ORAL_TABLET | ORAL | 0 refills | Status: AC | PRN

## 2020-09-29 NOTE — Plan of Care (Signed)

## 2020-09-29 NOTE — Plan of Care (Signed)
  Problem: Education: Goal: Knowledge of General Education information will improve Description: Including pain rating scale, medication(s)/side effects and non-pharmacologic comfort measures 09/29/2020 1205 by Bethann Punches, RN Outcome: Adequate for Discharge 09/29/2020 8453 by Bethann Punches, RN Outcome: Progressing   Problem: Health Behavior/Discharge Planning: Goal: Ability to manage health-related needs will improve 09/29/2020 1205 by Bethann Punches, RN Outcome: Adequate for Discharge 09/29/2020 6468 by Bethann Punches, RN Outcome: Progressing   Problem: Clinical Measurements: Goal: Ability to maintain clinical measurements within normal limits will improve 09/29/2020 1205 by Bethann Punches, RN Outcome: Adequate for Discharge 09/29/2020 0321 by Bethann Punches, RN Outcome: Progressing Goal: Will remain free from infection 09/29/2020 1205 by Bethann Punches, RN Outcome: Adequate for Discharge 09/29/2020 2248 by Bethann Punches, RN Outcome: Progressing Goal: Diagnostic test results will improve 09/29/2020 1205 by Bethann Punches, RN Outcome: Adequate for Discharge 09/29/2020 2500 by Bethann Punches, RN Outcome: Progressing Goal: Respiratory complications will improve 09/29/2020 1205 by Bethann Punches, RN Outcome: Adequate for Discharge 09/29/2020 3704 by Bethann Punches, RN Outcome: Progressing Goal: Cardiovascular complication will be avoided 09/29/2020 1205 by Bethann Punches, RN Outcome: Adequate for Discharge 09/29/2020 8889 by Bethann Punches, RN Outcome: Progressing   Problem: Activity: Goal: Risk for activity intolerance will decrease 09/29/2020 1205 by Bethann Punches, RN Outcome: Adequate for Discharge 09/29/2020 1694 by Bethann Punches, RN Outcome: Progressing   Problem: Nutrition: Goal: Adequate nutrition will be maintained 09/29/2020 1205 by Bethann Punches, RN Outcome: Adequate for Discharge 09/29/2020 5038 by Bethann Punches, RN Outcome: Progressing    Problem: Coping: Goal: Level of anxiety will decrease 09/29/2020 1205 by Bethann Punches, RN Outcome: Adequate for Discharge 09/29/2020 8828 by Bethann Punches, RN Outcome: Progressing   Problem: Elimination: Goal: Will not experience complications related to bowel motility 09/29/2020 1205 by Bethann Punches, RN Outcome: Adequate for Discharge 09/29/2020 0034 by Bethann Punches, RN Outcome: Progressing Goal: Will not experience complications related to urinary retention 09/29/2020 1205 by Bethann Punches, RN Outcome: Adequate for Discharge 09/29/2020 9179 by Bethann Punches, RN Outcome: Progressing   Problem: Pain Managment: Goal: General experience of comfort will improve 09/29/2020 1205 by Bethann Punches, RN Outcome: Adequate for Discharge 09/29/2020 1505 by Bethann Punches, RN Outcome: Progressing   Problem: Safety: Goal: Ability to remain free from injury will improve 09/29/2020 1205 by Bethann Punches, RN Outcome: Adequate for Discharge 09/29/2020 6979 by Bethann Punches, RN Outcome: Progressing   Problem: Skin Integrity: Goal: Risk for impaired skin integrity will decrease 09/29/2020 1205 by Bethann Punches, RN Outcome: Adequate for Discharge 09/29/2020 4801 by Bethann Punches, RN Outcome: Progressing

## 2020-09-29 NOTE — TOC Progression Note (Signed)
Transition of Care [facility]) - Progression Note    Patient Details  Name: Mary Le MRN: [id number] Date of Birth: Aug 31, 1945  Transition of Care [facility]) CM/SW Contact  Mary Lefevre Edson Snowball, RN Phone Number: 09/29/2020, 11:01 AM  Clinical Narrative:     Mary Le ready to accept patient. Rm [address].   Discussed with patient and daughter Mary Le. Since patient has not had her covid vaccinations she will be in isolation 10 days. However daughter can have a compassion visit. Daughter aware. Mary Le calling Mary Le now to discuss visits.   PTAR called for 1 pm.   Mid line needs to be removed /   Nurse to call report to [phone]. Mary Le would like nurse to call her when Mary Le arrives to her mothers room.  Expected Discharge Plan: [place]    Expected Discharge Plan and Services Expected Discharge Plan: [place]   Discharge Planning Services: CM Consult   Living arrangements for the past 2 months: Single Family Home Expected Discharge Date: 09/29/20               DME Arranged: Hospital bed (hoyer) DME Agency: [employer] Date DME Agency Contacted: 09/27/20 Time DME Agency Contacted: [phone] Representative spoke with at DME Agency: [place]: PT,OT,Nurse's Aide,Refused SNF,Social Work [facility] Agency: [place] Date [place]: 09/27/20 Time [place]: [place] Representative spoke with at [place]: [place] ([place]) Interventions    Readmission Risk Interventions No flowsheet data found.

## 2020-09-29 NOTE — Progress Notes (Addendum)
Called to give report, call forwarded and no answer. Will attempt later today.   1204- 2nd attempt, call forwarded and no answer.

## 2020-10-03 ENCOUNTER — Other Ambulatory Visit: Payer: Medicare Other

## 2020-10-19 ENCOUNTER — Other Ambulatory Visit: Payer: Self-pay | Admitting: Physician Assistant

## 2020-10-19 DIAGNOSIS — N644 Mastodynia: Secondary | ICD-10-CM

## 2020-11-29 ENCOUNTER — Ambulatory Visit
Admission: RE | Admit: 2020-11-29 | Discharge: 2020-11-29 | Disposition: A | Discharge status: Home / Self Care | Payer: Medicare Other | Source: Ambulatory Visit | Attending: Physician Assistant | Admitting: Physician Assistant

## 2020-11-29 ENCOUNTER — Other Ambulatory Visit: Payer: Self-pay | Admitting: Physician Assistant

## 2020-11-29 ENCOUNTER — Other Ambulatory Visit: Payer: Self-pay

## 2020-11-29 DIAGNOSIS — N644 Mastodynia: Secondary | ICD-10-CM

## 2020-12-20 ENCOUNTER — Other Ambulatory Visit: Payer: Self-pay | Admitting: General Surgery

## 2020-12-20 DIAGNOSIS — N63 Unspecified lump in unspecified breast: Secondary | ICD-10-CM

## 2020-12-26 ENCOUNTER — Other Ambulatory Visit: Payer: Medicare Other

## 2021-01-09 ENCOUNTER — Ambulatory Visit
Admission: RE | Admit: 2021-01-09 | Discharge: 2021-01-09 | Disposition: A | Discharge status: Home / Self Care | Payer: Medicare Other | Source: Ambulatory Visit | Attending: General Surgery | Admitting: General Surgery

## 2021-01-09 DIAGNOSIS — N63 Unspecified lump in unspecified breast: Secondary | ICD-10-CM

## 2021-01-12 ENCOUNTER — Telehealth: Payer: Self-pay | Admitting: Physical Medicine & Rehabilitation

## 2021-01-12 NOTE — Telephone Encounter (Signed)
Patient's daughter wants to schedule an appt for Botox with Dr. Letta Pate, but she hasn't been in office since 01/06/20.  Please advise if we can schedule Botox.

## 2021-01-14 LAB — AEROBIC/ANAEROBIC CULTURE W GRAM STAIN (SURGICAL/DEEP WOUND)
Culture: NO GROWTH
Special Requests: NORMAL

## 2021-02-08 ENCOUNTER — Encounter: Payer: Medicare Other | Attending: Physical Medicine & Rehabilitation | Admitting: Physical Medicine & Rehabilitation

## 2021-02-08 ENCOUNTER — Other Ambulatory Visit: Payer: Self-pay

## 2021-02-08 ENCOUNTER — Encounter: Payer: Self-pay | Admitting: Physical Medicine & Rehabilitation

## 2021-02-08 VITALS — BP 116/58 | HR 59 | Temp 98.7°F

## 2021-02-08 DIAGNOSIS — G8114 Spastic hemiplegia affecting left nondominant side: Secondary | ICD-10-CM | POA: Diagnosis present

## 2021-02-08 DIAGNOSIS — I69354 Hemiplegia and hemiparesis following cerebral infarction affecting left non-dominant side: Secondary | ICD-10-CM | POA: Insufficient documentation

## 2021-02-08 DIAGNOSIS — G8110 Spastic hemiplegia affecting unspecified side: Secondary | ICD-10-CM

## 2021-02-08 NOTE — Patient Instructions (Signed)

## 2021-02-08 NOTE — Progress Notes (Addendum)
Botox Injection for spasticity using needle EMG guidance  Dilution: 50 Units/ml Indication: Severe spasticity which interferes with ADL,mobility and/or  hygiene and is unresponsive to medication management and other conservative care History of right middle cerebral artery infarct on 06/03/2014. Patient has had inpatient rehab as well as outpatient rehab and despite this left upper extremity pain and stiffness have persisted.  The patient has had good results with botulinum toxin injection last performed 01/06/2020 with good relief of pain and stiffness.  As expected the results have worn off since it has been greater than 3 months postinjection.  Informed consent was obtained after describing risks and benefits of the procedure with the patient. This includes bleeding, bruising, infection, excessive weakness, or medication side effects. A REMS form is on file and signed. Needle: 27g 1" needle electrode Number of units per muscle  Pectoralis200 Brachioradialis25 Biceps 75 FDS50 FDP50 All injections were done after obtaining appropriate EMG activity and after negative drawback for blood. The patient tolerated the procedure well. Post procedure instructions were given. A followup appointment was made.

## 2021-02-15 ENCOUNTER — Telehealth: Payer: Self-pay

## 2021-02-15 NOTE — Telephone Encounter (Signed)
Lmom pt to call us back to see if they are interested in starting it

## 2021-03-29 ENCOUNTER — Encounter: Payer: Self-pay | Admitting: Gastroenterology

## 2021-04-06 ENCOUNTER — Encounter: Payer: Medicare Other | Admitting: Physical Medicine & Rehabilitation

## 2021-06-27 ENCOUNTER — Encounter: Payer: Medicare Other | Admitting: Gastroenterology

## 2024-01-17 ENCOUNTER — Encounter (HOSPITAL_COMMUNITY): Payer: Self-pay | Admitting: Interventional Radiology
